# Patient Record
Sex: Male | Born: 1955 | Race: White | Hispanic: No | Marital: Married | Smoking: Former smoker
Health system: Southern US, Community
[De-identification: ages and names within clinical notes are randomized; demographics above are authoritative.]

## PROBLEM LIST (undated history)

## (undated) DIAGNOSIS — C61 Malignant neoplasm of prostate: Secondary | ICD-10-CM

## (undated) DIAGNOSIS — T8859XA Other complications of anesthesia, initial encounter: Secondary | ICD-10-CM

## (undated) DIAGNOSIS — Z85828 Personal history of other malignant neoplasm of skin: Secondary | ICD-10-CM

## (undated) DIAGNOSIS — R0600 Dyspnea, unspecified: Secondary | ICD-10-CM

## (undated) DIAGNOSIS — K219 Gastro-esophageal reflux disease without esophagitis: Secondary | ICD-10-CM

## (undated) DIAGNOSIS — E039 Hypothyroidism, unspecified: Secondary | ICD-10-CM

## (undated) DIAGNOSIS — Z803 Family history of malignant neoplasm of breast: Secondary | ICD-10-CM

## (undated) DIAGNOSIS — D649 Anemia, unspecified: Secondary | ICD-10-CM

## (undated) DIAGNOSIS — N320 Bladder-neck obstruction: Secondary | ICD-10-CM

## (undated) DIAGNOSIS — Z9889 Other specified postprocedural states: Secondary | ICD-10-CM

## (undated) DIAGNOSIS — Z8042 Family history of malignant neoplasm of prostate: Secondary | ICD-10-CM

## (undated) DIAGNOSIS — C801 Malignant (primary) neoplasm, unspecified: Secondary | ICD-10-CM

## (undated) DIAGNOSIS — Z800 Family history of malignant neoplasm of digestive organs: Secondary | ICD-10-CM

## (undated) DIAGNOSIS — R112 Nausea with vomiting, unspecified: Secondary | ICD-10-CM

## (undated) HISTORY — PX: WISDOM TOOTH EXTRACTION: SHX21

## (undated) HISTORY — DX: Family history of malignant neoplasm of prostate: Z80.42

## (undated) HISTORY — PX: SKIN CANCER EXCISION: SHX779

## (undated) HISTORY — DX: Family history of malignant neoplasm of breast: Z80.3

## (undated) HISTORY — PX: TOOTH EXTRACTION: SHX859

## (undated) HISTORY — PX: TONSILLECTOMY: SHX5217

## (undated) HISTORY — PX: PROSTATE SURGERY: SHX751

## (undated) HISTORY — DX: Family history of malignant neoplasm of digestive organs: Z80.0

---
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
## 1993-03-19 HISTORY — PX: HERNIA REPAIR: SHX51

## 2013-03-19 DIAGNOSIS — K5792 Diverticulitis of intestine, part unspecified, without perforation or abscess without bleeding: Secondary | ICD-10-CM

## 2013-03-19 HISTORY — DX: Diverticulitis of intestine, part unspecified, without perforation or abscess without bleeding: K57.92

## 2016-12-18 ENCOUNTER — Emergency Department (HOSPITAL_COMMUNITY)
Admission: EM | Admit: 2016-12-18 | Discharge: 2016-12-18 | Disposition: A | Discharge status: Home / Self Care | Payer: BLUE CROSS/BLUE SHIELD | Attending: Emergency Medicine | Admitting: Emergency Medicine

## 2016-12-18 ENCOUNTER — Emergency Department (HOSPITAL_COMMUNITY): Payer: BLUE CROSS/BLUE SHIELD

## 2016-12-18 ENCOUNTER — Other Ambulatory Visit: Payer: Self-pay | Admitting: Urology

## 2016-12-18 ENCOUNTER — Encounter (HOSPITAL_COMMUNITY): Payer: Self-pay

## 2016-12-18 DIAGNOSIS — R339 Retention of urine, unspecified: Secondary | ICD-10-CM | POA: Diagnosis present

## 2016-12-18 DIAGNOSIS — R319 Hematuria, unspecified: Secondary | ICD-10-CM | POA: Diagnosis not present

## 2016-12-18 DIAGNOSIS — C61 Malignant neoplasm of prostate: Secondary | ICD-10-CM

## 2016-12-18 DIAGNOSIS — Z87891 Personal history of nicotine dependence: Secondary | ICD-10-CM | POA: Insufficient documentation

## 2016-12-18 LAB — URINALYSIS, ROUTINE W REFLEX MICROSCOPIC
BACTERIA UA: NONE SEEN
Bilirubin Urine: NEGATIVE
Glucose, UA: NEGATIVE mg/dL
Ketones, ur: 20 mg/dL — AB
LEUKOCYTES UA: NEGATIVE
NITRITE: NEGATIVE
Protein, ur: 100 mg/dL — AB
Specific Gravity, Urine: 1.019 (ref 1.005–1.030)
pH: 6 (ref 5.0–8.0)

## 2016-12-18 LAB — CBC WITH DIFFERENTIAL/PLATELET
BASOS ABS: 0 10*3/uL (ref 0.0–0.1)
BASOS PCT: 0 %
EOS ABS: 0 10*3/uL (ref 0.0–0.7)
EOS PCT: 0 %
HCT: 42.1 % (ref 39.0–52.0)
Hemoglobin: 15.2 g/dL (ref 13.0–17.0)
Lymphocytes Relative: 18 %
Lymphs Abs: 1.3 10*3/uL (ref 0.7–4.0)
MCH: 31.5 pg (ref 26.0–34.0)
MCHC: 36.1 g/dL — ABNORMAL HIGH (ref 30.0–36.0)
MCV: 87.3 fL (ref 78.0–100.0)
Monocytes Absolute: 0.6 10*3/uL (ref 0.1–1.0)
Monocytes Relative: 8 %
Neutro Abs: 5.4 10*3/uL (ref 1.7–7.7)
Neutrophils Relative %: 74 %
PLATELETS: 293 10*3/uL (ref 150–400)
RBC: 4.82 MIL/uL (ref 4.22–5.81)
RDW: 12.3 % (ref 11.5–15.5)
WBC: 7.2 10*3/uL (ref 4.0–10.5)

## 2016-12-18 LAB — BASIC METABOLIC PANEL
ANION GAP: 7 (ref 5–15)
BUN: 11 mg/dL (ref 6–20)
CO2: 25 mmol/L (ref 22–32)
Calcium: 9.2 mg/dL (ref 8.9–10.3)
Chloride: 99 mmol/L — ABNORMAL LOW (ref 101–111)
Creatinine, Ser: 0.67 mg/dL (ref 0.61–1.24)
GFR calc Af Amer: >60 mL/min (ref >60)
Glucose, Bld: 104 mg/dL — ABNORMAL HIGH (ref 65–99)
POTASSIUM: 3.9 mmol/L (ref 3.5–5.1)
SODIUM: 131 mmol/L — AB (ref 135–145)

## 2016-12-18 MED ORDER — MORPHINE SULFATE (PF) 4 MG/ML IV SOLN: 4.0000 mg | Freq: Once | INTRAVENOUS | Status: DC

## 2016-12-18 MED ORDER — HYDROCODONE-ACETAMINOPHEN 5-325 MG PO TABS: 1.0000 | ORAL_TABLET | ORAL | 0 refills | Status: DC | PRN

## 2016-12-18 MED ORDER — ONDANSETRON HCL 4 MG/2ML IJ SOLN: 4.0000 mg | Freq: Once | INTRAMUSCULAR | Status: DC

## 2016-12-18 MED ORDER — ONDANSETRON HCL 4 MG PO TABS: 4.0000 mg | ORAL_TABLET | Freq: Four times a day (QID) | ORAL | 0 refills | Status: DC

## 2016-12-18 NOTE — ED Provider Notes (Signed)
[facility] DEPT Provider Note   CSN: [id number] Arrival date & time: 12/18/16  6270     History   Chief Complaint No chief complaint on file.   HPI Scott [name] is a 61 y.o. male.  Patient reports difficulty passing his urine and hematuria the past 2 weeks. Symptoms actually evolved over the past year and a half.  He was seen by his primary care physician Dr. Anastasio Champion and PSA was 159. He has not been able to urinate since midnight last night. No fever, sweats, chills, flank pain, weight loss. Severity of symptoms is moderate to severe. Nothing makes symptoms better or worse.       History reviewed. No pertinent past medical history.  There are no active problems to display for this patient.   Past Surgical History:  Procedure Laterality Date  . HERNIA REPAIR         Home Medications    Prior to Admission medications   Not on File    Family History No family history on file.  Social History Social History  Substance Use Topics  . Smoking status: Former Research scientist (life sciences)  . Smokeless tobacco: Never Used  . Alcohol use No     Allergies   Wellbutrin [bupropion]   Review of Systems Review of Systems  All other systems reviewed and are negative.    Physical Exam Updated Vital Signs BP (!) 186/102 (BP Location: Left Arm)   Pulse 94   Temp 98.5 F (36.9 C) (Oral)   Resp 18   Ht 5\' 6"  (1.676 m)   Wt 81.6 kg (180 lb)   SpO2 99%   BMI 29.05 kg/m   Physical Exam  Constitutional: He is oriented to person, place, and time. He appears well-developed and well-nourished.  HENT:  Head: Normocephalic and atraumatic.  Eyes: Conjunctivae are normal.  Neck: Neck supple.  Cardiovascular: Normal rate and regular rhythm.   Pulmonary/Chest: Effort normal and breath sounds normal.  Abdominal: Soft. Bowel sounds are normal.  Nontender abdomen.  Musculoskeletal: Normal range of motion.  Neurological: He is alert and oriented to person, place, and time.  Skin: Skin  is warm and dry.  Psychiatric: He has a normal mood and affect. His behavior is normal.  Nursing note and vitals reviewed.    ED Treatments / Results  Labs (all labs ordered are listed, but only abnormal results are displayed) Labs Reviewed  URINALYSIS, ROUTINE W REFLEX MICROSCOPIC - Abnormal; Notable for the following:       Result Value   Hgb urine dipstick LARGE (*)    Ketones, ur 20 (*)    Protein, ur 100 (*)    Squamous Epithelial / LPF 0-5 (*)    All other components within normal limits  CBC WITH DIFFERENTIAL/PLATELET - Abnormal; Notable for the following:    MCHC 36.1 (*)    All other components within normal limits  BASIC METABOLIC PANEL - Abnormal; Notable for the following:    Sodium 131 (*)    Chloride 99 (*)    Glucose, Bld 104 (*)    All other components within normal limits    EKG  EKG Interpretation None       Radiology US Renal  Result Date: 12/18/2016 CLINICAL DATA:  Urinary retention EXAM: RENAL / URINARY TRACT ULTRASOUND COMPLETE COMPARISON:  None. FINDINGS: Right Kidney: Length: 11.1 cm. Echogenicity within normal limits. No mass or hydronephrosis visualized. Left Kidney: Length: 10.8 cm. Echogenicity within normal limits. No mass or hydronephrosis visualized. Bladder: Decompressed  by Foley catheter. IMPRESSION: Normal appearing kidneys. Electronically Signed   By: Inez Catalina M.D.   On: 12/18/2016 11:19    Procedures Procedures (including critical care time)  Medications Ordered in ED Medications - No data to display   Initial Impression / Assessment and Plan / ED Course  I have reviewed the triage vital signs and the nursing notes.  Pertinent labs & imaging results that were available during my care of the patient were reviewed by me and considered in my medical decision making (see chart for details).     Patient presents with symptoms of urinary retention. Creatinine normal. Ultrasound of kidneys normal. Urinalysis shows RBCs, but no  WBCs.  I suspect BPH versus prostate cancer versus prostatitis. We'll see urologist this afternoon. Patient will be discharged home with a Foley catheter.  Final Clinical Impressions(s) / ED Diagnoses   Final diagnoses:  Urinary retention    New Prescriptions New Prescriptions   No medications on file     Nat Christen, MD 12/18/16 1225

## 2016-12-18 NOTE — ED Notes (Signed)
Pt changed to leg bag and instructions given on care.

## 2016-12-18 NOTE — ED Triage Notes (Signed)
Pt in restroom 

## 2016-12-18 NOTE — ED Triage Notes (Signed)
Pt reports difficulty urinating for the past 2 weeks.  Reports went to his pcp and was told his psa was very elevated and was concerning for prostate cancer.  Pt unable to void since midnight last night.  Pt says feels like has to void but can't.  Pt has appt with urologist this afternoon.

## 2016-12-18 NOTE — Discharge Instructions (Signed)
Follow-up with urologist this afternoon. Continue Foley catheter until instructed otherwise.

## 2016-12-31 ENCOUNTER — Emergency Department (HOSPITAL_COMMUNITY)
Admission: EM | Admit: 2016-12-31 | Discharge: 2016-12-31 | Disposition: A | Discharge status: Home / Self Care | Payer: BLUE CROSS/BLUE SHIELD | Attending: Emergency Medicine | Admitting: Emergency Medicine

## 2016-12-31 ENCOUNTER — Encounter (HOSPITAL_COMMUNITY): Payer: Self-pay

## 2016-12-31 DIAGNOSIS — Y829 Unspecified medical devices associated with adverse incidents: Secondary | ICD-10-CM | POA: Insufficient documentation

## 2016-12-31 DIAGNOSIS — T83098A Other mechanical complication of other indwelling urethral catheter, initial encounter: Secondary | ICD-10-CM | POA: Diagnosis not present

## 2016-12-31 DIAGNOSIS — Z87891 Personal history of nicotine dependence: Secondary | ICD-10-CM | POA: Diagnosis not present

## 2016-12-31 DIAGNOSIS — R339 Retention of urine, unspecified: Secondary | ICD-10-CM | POA: Diagnosis present

## 2016-12-31 DIAGNOSIS — T839XXA Unspecified complication of genitourinary prosthetic device, implant and graft, initial encounter: Secondary | ICD-10-CM

## 2016-12-31 LAB — URINALYSIS, ROUTINE W REFLEX MICROSCOPIC
Bilirubin Urine: NEGATIVE
Glucose, UA: NEGATIVE mg/dL
Ketones, ur: 20 mg/dL — AB
Nitrite: NEGATIVE
Protein, ur: 30 mg/dL — AB
SPECIFIC GRAVITY, URINE: 1.005 (ref 1.005–1.030)
SQUAMOUS EPITHELIAL / LPF: NONE SEEN
pH: 5 (ref 5.0–8.0)

## 2016-12-31 NOTE — ED Notes (Signed)
Pt in serve pain from indwelling foley catheter.  Irrigated foley with 50 ml of sterile water.  Immediately 600 ml of cloudy urine with particles flowed into foley bag.  Pt with immediately relief following irrigation.

## 2016-12-31 NOTE — ED Provider Notes (Signed)
[facility] EMERGENCY DEPARTMENT Provider Note   CSN: [id number] Arrival date & time: 12/31/16  1513     History   Chief Complaint Chief Complaint  Patient presents with  . Foley Catheter Clogged    HPI Scott [name] is a 61 y.o. male presenting with urinary retention.  He was recently diagnosed with prostate cancer, biopsy performed 4 days ago followed by foley placement.  He has had no concerns or pain until the tube stopped draining this morning, presenting with suprapubic pain and distention. He has found no alleviators and did not have the equipment at home to try to flush the tube. Denies fevers, chills, n/v or other complaint.  The history is provided by the patient and the spouse.    History reviewed. No pertinent past medical history.  There are no active problems to display for this patient.   Past Surgical History:  Procedure Laterality Date  . HERNIA REPAIR         Home Medications    Prior to Admission medications   Medication Sig Start Date End Date Taking? Authorizing Provider  HYDROcodone-acetaminophen (NORCO/VICODIN) 5-325 MG tablet Take 1-2 tablets by mouth every 4 (four) hours as needed. 12/18/16   Nat Christen, MD  ondansetron (ZOFRAN) 4 MG tablet Take 1 tablet (4 mg total) by mouth every 6 (six) hours. 12/18/16   Nat Christen, MD    Family History No family history on file.  Social History Social History  Substance Use Topics  . Smoking status: Former Research scientist (life sciences)  . Smokeless tobacco: Never Used  . Alcohol use No     Allergies   Wellbutrin [bupropion]   Review of Systems Review of Systems  Constitutional: Negative for fever.  HENT: Negative for congestion and sore throat.   Eyes: Negative.   Respiratory: Negative for chest tightness and shortness of breath.   Cardiovascular: Negative for chest pain.  Gastrointestinal: Positive for abdominal pain. Negative for nausea.  Genitourinary: Positive for difficulty urinating. Negative for flank  pain, hematuria, penile pain and urgency.  Musculoskeletal: Negative for arthralgias, joint swelling and neck pain.  Skin: Negative.  Negative for rash and wound.  Neurological: Negative for dizziness, weakness, light-headedness, numbness and headaches.  Psychiatric/Behavioral: Negative.      Physical Exam Updated Vital Signs There were no vitals taken for this visit.  Physical Exam  Constitutional: He appears well-developed and well-nourished.  HENT:  Head: Normocephalic and atraumatic.  Eyes: Conjunctivae are normal.  Neck: Normal range of motion.  Cardiovascular: Normal rate, regular rhythm, normal heart sounds and intact distal pulses.   Pulmonary/Chest: Effort normal and breath sounds normal. He has no wheezes.  Abdominal: Soft. Bowel sounds are normal. There is tenderness in the suprapubic area.  Suprapubic ttp , resolved with flushing of urine catheter.  Musculoskeletal: Normal range of motion.  Neurological: He is alert.  Skin: Skin is warm and dry.  Psychiatric: He has a normal mood and affect.  Nursing note and vitals reviewed.    ED Treatments / Results  Labs (all labs ordered are listed, but only abnormal results are displayed) Labs Reviewed  URINALYSIS, ROUTINE W REFLEX MICROSCOPIC - Abnormal; Notable for the following:       Result Value   APPearance HAZY (*)    Hgb urine dipstick LARGE (*)    Ketones, ur 20 (*)    Protein, ur 30 (*)    Leukocytes, UA SMALL (*)    Bacteria, UA RARE (*)    All other components within  normal limits  URINE CULTURE    EKG  EKG Interpretation None       Radiology No results found.  Procedures Procedures (including critical care time)  Medications Ordered in ED Medications - No data to display   Initial Impression / Assessment and Plan / ED Course  I have reviewed the triage vital signs and the nursing notes.  Pertinent labs & imaging results that were available during my care of the patient were reviewed by  me and considered in my medical decision making (see chart for details).     600 cc urine obtained after flushing. Sx relief obtained.  Urine negative for infection, some leukocytes, suspect reactive, culture ordered.  Pt sx free at time of dc.  Cath care instruction given. He is scheduled for further testing tomorrow with urology. Prn f/u anticipated.  Final Clinical Impressions(s) / ED Diagnoses   Final diagnoses:  Foley catheter problem, initial encounter [facility])    New Prescriptions New Prescriptions   No medications on file     Landis Martins 01/01/17 0139    Julianne Rice, MD 01/01/17 [phone]

## 2016-12-31 NOTE — ED Triage Notes (Signed)
Patient reports of catheter clogging today. Last urine seen was 1100 today. Patient had biopsy of prostate Thursday.

## 2017-01-01 ENCOUNTER — Encounter (HOSPITAL_COMMUNITY)
Admission: RE | Admit: 2017-01-01 | Discharge: 2017-01-01 | Disposition: A | Discharge status: Home / Self Care | Payer: BLUE CROSS/BLUE SHIELD | Source: Ambulatory Visit | Attending: Urology | Admitting: Urology

## 2017-01-01 DIAGNOSIS — C61 Malignant neoplasm of prostate: Secondary | ICD-10-CM | POA: Diagnosis not present

## 2017-01-01 MED ORDER — TECHNETIUM TC 99M MEDRONATE IV KIT
25.0000 | PACK | Freq: Once | INTRAVENOUS | Status: AC | PRN
  Administered 2017-01-01: 21.7 via INTRAVENOUS

## 2017-01-02 LAB — URINE CULTURE: Culture: NO GROWTH

## 2017-01-15 ENCOUNTER — Other Ambulatory Visit: Payer: Self-pay | Admitting: Urology

## 2017-01-21 NOTE — Progress Notes (Signed)
Urine culture, urinalysis 12-31-16 epic

## 2017-01-21 NOTE — Patient Instructions (Signed)
Scott [name]  01/21/2017   Your procedure is scheduled on: 01-25-17  Report to [facility] Main  Entrance Take [facility]  elevators to 3rd floor to  [place] at 1:30PM.    Call this number if you have problems the morning of surgery [phone]   Remember: ONLY 1 PERSON MAY GO WITH YOU TO SHORT STAY TO GET  READY MORNING OF [place].    Do not eat food After Midnight. You may have clear liquids from midnight until 930AM day of surgery. Nothing by mouth after 930AM!!     Take these medicines the morning of surgery with A SIP OF WATER: tylenol if needed                                You may not have any metal on your body including hair pins and              piercings  Do not wear jewelry, make-up, lotions, powders or perfumes, deodorant                         Men may shave face and neck.   Do not bring valuables to the hospital. [place].  Contacts, dentures or bridgework may not be worn into surgery.  Leave suitcase in the car. After surgery it may be brought to your room.                 Please read over the following fact sheets you were given: _____________________________________________________________________     CLEAR LIQUID DIET   Foods Allowed                                                                     Foods Excluded  Coffee and tea, regular and decaf                             liquids that you cannot  Plain Jell-O in any flavor                                             see through such as: Fruit ices (not with fruit pulp)                                     milk, soups, orange juice  Iced Popsicles                                    All solid food Carbonated beverages, regular and diet  Cranberry, grape and apple juices Sports drinks like Gatorade Lightly seasoned clear broth or consume(fat free) Sugar, honey syrup  Sample  Menu Breakfast                                Lunch                                     Supper Cranberry juice                    Beef broth                            Chicken broth Jell-O                                     Grape juice                           Apple juice Coffee or tea                        Jell-O                                      Popsicle                                                Coffee or tea                        Coffee or tea  _____________________________________________________________________  [facility] - Preparing for Surgery Before surgery, you can play an important role.  Because skin is not sterile, your skin needs to be as free of germs as possible.  You can reduce the number of germs on your skin by washing with CHG (chlorahexidine gluconate) soap before surgery.  CHG is an antiseptic cleaner which kills germs and bonds with the skin to continue killing germs even after washing. Please DO NOT use if you have an allergy to CHG or antibacterial soaps.  If your skin becomes reddened/irritated stop using the CHG and inform your nurse when you arrive at Short Stay. Do not shave (including legs and underarms) for at least 48 hours prior to the first CHG shower.  You may shave your face/neck. Please follow these instructions carefully:  1.  Shower with CHG Soap the night before surgery and the  morning of Surgery.  2.  If you choose to wash your hair, wash your hair first as usual with your  normal  shampoo.  3.  After you shampoo, rinse your hair and body thoroughly to remove the  shampoo.                           4.  Use CHG as you would any other liquid soap.  You can apply chg directly  to the skin and wash  Gently with a scrungie or clean washcloth.  5.  Apply the CHG Soap to your body ONLY FROM THE NECK DOWN.   Do not use on face/ open                           Wound or open sores. Avoid contact with eyes, ears mouth and genitals  (private parts).                       Wash face,  Genitals (private parts) with your normal soap.             6.  Wash thoroughly, paying special attention to the area where your surgery  will be performed.  7.  Thoroughly rinse your body with warm water from the neck down.  8.  DO NOT shower/wash with your normal soap after using and rinsing off  the CHG Soap.                9.  Pat yourself dry with a clean towel.            10.  Wear clean pajamas.            11.  Place clean sheets on your bed the night of your first shower and do not  sleep with pets. Day of Surgery : Do not apply any lotions/deodorants the morning of surgery.  Please wear clean clothes to the hospital/surgery center.  FAILURE TO FOLLOW THESE INSTRUCTIONS MAY RESULT IN THE CANCELLATION OF YOUR SURGERY PATIENT SIGNATURE_________________________________  NURSE SIGNATURE__________________________________  ________________________________________________________________________

## 2017-01-21 NOTE — Progress Notes (Signed)
Please place orders in epic. Patient has PAT appt 01-23-17. Thank you!

## 2017-01-23 ENCOUNTER — Other Ambulatory Visit: Payer: Self-pay

## 2017-01-23 ENCOUNTER — Encounter (HOSPITAL_COMMUNITY): Payer: Self-pay

## 2017-01-23 ENCOUNTER — Encounter (HOSPITAL_COMMUNITY)
Admission: RE | Admit: 2017-01-23 | Discharge: 2017-01-23 | Disposition: A | Discharge status: Home / Self Care | Payer: BLUE CROSS/BLUE SHIELD | Source: Ambulatory Visit | Attending: Urology | Admitting: Urology

## 2017-01-23 DIAGNOSIS — R339 Retention of urine, unspecified: Secondary | ICD-10-CM | POA: Diagnosis present

## 2017-01-23 DIAGNOSIS — C61 Malignant neoplasm of prostate: Secondary | ICD-10-CM | POA: Diagnosis not present

## 2017-01-23 DIAGNOSIS — Z87891 Personal history of nicotine dependence: Secondary | ICD-10-CM | POA: Diagnosis not present

## 2017-01-23 DIAGNOSIS — Z79899 Other long term (current) drug therapy: Secondary | ICD-10-CM | POA: Diagnosis not present

## 2017-01-23 LAB — CBC
HCT: 41.2 % (ref 39.0–52.0)
Hemoglobin: 14.9 g/dL (ref 13.0–17.0)
MCH: 31.8 pg (ref 26.0–34.0)
MCHC: 36.2 g/dL — ABNORMAL HIGH (ref 30.0–36.0)
MCV: 87.8 fL (ref 78.0–100.0)
PLATELETS: 290 10*3/uL (ref 150–400)
RBC: 4.69 MIL/uL (ref 4.22–5.81)
RDW: 12 % (ref 11.5–15.5)
WBC: 4.6 10*3/uL (ref 4.0–10.5)

## 2017-01-23 LAB — BASIC METABOLIC PANEL
Anion gap: 8 (ref 5–15)
BUN: 9 mg/dL (ref 6–20)
CALCIUM: 9.4 mg/dL (ref 8.9–10.3)
CO2: 26 mmol/L (ref 22–32)
CREATININE: 0.76 mg/dL (ref 0.61–1.24)
Chloride: 100 mmol/L — ABNORMAL LOW (ref 101–111)
GFR calc Af Amer: >60 mL/min (ref >60)
Glucose, Bld: 106 mg/dL — ABNORMAL HIGH (ref 65–99)
Potassium: 4.3 mmol/L (ref 3.5–5.1)
SODIUM: 134 mmol/L — AB (ref 135–145)

## 2017-01-23 NOTE — Progress Notes (Signed)
Consent not signed at pre-op as patient expresses wish to make changes. Patient reports he will contact surgeon office to make aware.

## 2017-01-24 ENCOUNTER — Other Ambulatory Visit: Payer: Self-pay | Admitting: Urology

## 2017-01-24 MED ORDER — GENTAMICIN SULFATE 40 MG/ML IJ SOLN
5.0000 mg/kg | INTRAMUSCULAR | Status: AC
Start: 2017-01-25 — End: 2017-01-25
  Administered 2017-01-25: 350 mg via INTRAVENOUS
  Filled 2017-01-24 (×2): qty 8.75

## 2017-01-24 MED ORDER — GENTAMICIN SULFATE 40 MG/ML IJ SOLN
5.0000 mg/kg | INTRAVENOUS | Status: DC
  Filled 2017-01-24: qty 10

## 2017-01-25 ENCOUNTER — Other Ambulatory Visit: Payer: Self-pay

## 2017-01-25 ENCOUNTER — Ambulatory Visit (HOSPITAL_COMMUNITY): Payer: BLUE CROSS/BLUE SHIELD | Admitting: Certified Registered Nurse Anesthetist

## 2017-01-25 ENCOUNTER — Encounter (HOSPITAL_COMMUNITY): Payer: Self-pay | Admitting: *Deleted

## 2017-01-25 ENCOUNTER — Encounter (HOSPITAL_COMMUNITY)
Admission: RE | Disposition: A | Discharge status: Home / Self Care | Payer: Self-pay | Source: Ambulatory Visit | Attending: Urology

## 2017-01-25 ENCOUNTER — Observation Stay (HOSPITAL_COMMUNITY)
Admission: RE | Admit: 2017-01-25 | Discharge: 2017-01-26 | Disposition: A | Discharge status: Home / Self Care | Payer: BLUE CROSS/BLUE SHIELD | Source: Ambulatory Visit | Attending: Urology | Admitting: Urology

## 2017-01-25 DIAGNOSIS — Z87891 Personal history of nicotine dependence: Secondary | ICD-10-CM | POA: Insufficient documentation

## 2017-01-25 DIAGNOSIS — Z79899 Other long term (current) drug therapy: Secondary | ICD-10-CM | POA: Insufficient documentation

## 2017-01-25 DIAGNOSIS — R339 Retention of urine, unspecified: Secondary | ICD-10-CM | POA: Diagnosis not present

## 2017-01-25 DIAGNOSIS — C61 Malignant neoplasm of prostate: Secondary | ICD-10-CM | POA: Insufficient documentation

## 2017-01-25 HISTORY — PX: TRANSURETHRAL RESECTION OF PROSTATE: SHX73

## 2017-01-25 SURGERY — TURP (TRANSURETHRAL RESECTION OF PROSTATE)
Anesthesia: General

## 2017-01-25 MED ORDER — TRAMADOL HCL 50 MG PO TABS: 50.0000 mg | ORAL_TABLET | Freq: Four times a day (QID) | ORAL | 0 refills | Status: DC | PRN

## 2017-01-25 MED ORDER — BICALUTAMIDE 50 MG PO TABS: 50.0000 mg | ORAL_TABLET | Freq: Every day | ORAL | 0 refills | Status: DC

## 2017-01-25 MED ORDER — LACTATED RINGERS IV SOLN
INTRAVENOUS | Status: DC
  Administered 2017-01-25 (×2): via INTRAVENOUS

## 2017-01-25 MED ORDER — PROPOFOL 10 MG/ML IV BOLUS
INTRAVENOUS | Status: AC
  Filled 2017-01-25: qty 20

## 2017-01-25 MED ORDER — DEXAMETHASONE SODIUM PHOSPHATE 10 MG/ML IJ SOLN
INTRAMUSCULAR | Status: DC | PRN
  Administered 2017-01-25: 10 mg via INTRAVENOUS

## 2017-01-25 MED ORDER — SENNOSIDES-DOCUSATE SODIUM 8.6-50 MG PO TABS
2.0000 | ORAL_TABLET | Freq: Every day | ORAL | Status: DC
  Filled 2017-01-25: qty 2

## 2017-01-25 MED ORDER — PROPOFOL 10 MG/ML IV BOLUS
INTRAVENOUS | Status: DC | PRN
Start: 2017-01-25 — End: 2017-01-25
  Administered 2017-01-25 (×2): 200 mg via INTRAVENOUS

## 2017-01-25 MED ORDER — FENTANYL CITRATE (PF) 100 MCG/2ML IJ SOLN
INTRAMUSCULAR | Status: DC | PRN
  Administered 2017-01-25 (×4): 50 ug via INTRAVENOUS

## 2017-01-25 MED ORDER — FENTANYL CITRATE (PF) 100 MCG/2ML IJ SOLN
INTRAMUSCULAR | Status: AC
  Filled 2017-01-25: qty 2

## 2017-01-25 MED ORDER — ACETAMINOPHEN 500 MG PO TABS
1000.0000 mg | ORAL_TABLET | Freq: Four times a day (QID) | ORAL | Status: DC
  Administered 2017-01-25 – 2017-01-26 (×3): 1000 mg via ORAL
  Filled 2017-01-25 (×3): qty 2

## 2017-01-25 MED ORDER — SODIUM CHLORIDE 0.9 % IV SOLN
INTRAVENOUS | Status: DC
  Administered 2017-01-25: 18:00:00 via INTRAVENOUS

## 2017-01-25 MED ORDER — FENTANYL CITRATE (PF) 100 MCG/2ML IJ SOLN
25.0000 ug | INTRAMUSCULAR | Status: DC | PRN
  Administered 2017-01-25: 25 ug via INTRAVENOUS
  Administered 2017-01-25: 50 ug via INTRAVENOUS
  Administered 2017-01-25: 25 ug via INTRAVENOUS
  Administered 2017-01-25: 50 ug via INTRAVENOUS

## 2017-01-25 MED ORDER — TRAMADOL HCL 50 MG PO TABS
50.0000 mg | ORAL_TABLET | Freq: Four times a day (QID) | ORAL | Status: DC | PRN
  Administered 2017-01-25: 100 mg via ORAL
  Administered 2017-01-26: 50 mg via ORAL
  Filled 2017-01-25: qty 2
  Filled 2017-01-25: qty 1

## 2017-01-25 MED ORDER — MIDAZOLAM HCL 2 MG/2ML IJ SOLN
INTRAMUSCULAR | Status: AC
  Filled 2017-01-25: qty 2

## 2017-01-25 MED ORDER — ONDANSETRON HCL 4 MG/2ML IJ SOLN
INTRAMUSCULAR | Status: AC
  Filled 2017-01-25: qty 2

## 2017-01-25 MED ORDER — CLINDAMYCIN PHOSPHATE 900 MG/50ML IV SOLN
900.0000 mg | INTRAVENOUS | Status: AC
  Administered 2017-01-25: 900 mg via INTRAVENOUS
  Filled 2017-01-25: qty 50

## 2017-01-25 MED ORDER — OXYCODONE HCL 5 MG/5ML PO SOLN: 5.0000 mg | Freq: Once | ORAL | Status: DC | PRN

## 2017-01-25 MED ORDER — HYDROMORPHONE HCL 1 MG/ML IJ SOLN
0.5000 mg | INTRAMUSCULAR | Status: DC | PRN
  Administered 2017-01-25: 1 mg via INTRAVENOUS
  Filled 2017-01-25: qty 1

## 2017-01-25 MED ORDER — DEXAMETHASONE SODIUM PHOSPHATE 10 MG/ML IJ SOLN
INTRAMUSCULAR | Status: AC
  Filled 2017-01-25: qty 1

## 2017-01-25 MED ORDER — LIDOCAINE 2% (20 MG/ML) 5 ML SYRINGE
INTRAMUSCULAR | Status: DC | PRN
  Administered 2017-01-25: 80 mg via INTRAVENOUS

## 2017-01-25 MED ORDER — MIDAZOLAM HCL 5 MG/5ML IJ SOLN
INTRAMUSCULAR | Status: DC | PRN
  Administered 2017-01-25 (×2): 1 mg via INTRAVENOUS

## 2017-01-25 MED ORDER — PROMETHAZINE HCL 25 MG/ML IJ SOLN: 6.2500 mg | INTRAMUSCULAR | Status: DC | PRN

## 2017-01-25 MED ORDER — OXYCODONE HCL 5 MG PO TABS: 5.0000 mg | ORAL_TABLET | Freq: Once | ORAL | Status: DC | PRN

## 2017-01-25 MED ORDER — MEPERIDINE HCL 50 MG/ML IJ SOLN: 6.2500 mg | INTRAMUSCULAR | Status: DC | PRN

## 2017-01-25 MED ORDER — ONDANSETRON HCL 4 MG/2ML IJ SOLN
INTRAMUSCULAR | Status: DC | PRN
  Administered 2017-01-25: 4 mg via INTRAVENOUS

## 2017-01-25 MED ORDER — SODIUM CHLORIDE 0.9 % IR SOLN
Status: DC | PRN
  Administered 2017-01-25: 12000 mL via INTRAVESICAL

## 2017-01-25 MED ORDER — CEPHALEXIN 500 MG PO CAPS: 500.0000 mg | ORAL_CAPSULE | Freq: Two times a day (BID) | ORAL | 0 refills | Status: DC

## 2017-01-25 SURGICAL SUPPLY — 16 items
BAG URINE DRAINAGE (UROLOGICAL SUPPLIES) ×3 IMPLANT
BAG URO CATCHER STRL LF (MISCELLANEOUS) ×3 IMPLANT
CATH HEMA 3WAY 30CC 22FR COUDE (CATHETERS) ×3 IMPLANT
COVER FOOTSWITCH UNIV (MISCELLANEOUS) ×3 IMPLANT
COVER SURGICAL LIGHT HANDLE (MISCELLANEOUS) IMPLANT
ELECT REM PT RETURN 15FT ADLT (MISCELLANEOUS) IMPLANT
GLOVE BIOGEL M STRL SZ7.5 (GLOVE) ×9 IMPLANT
GOWN STRL REUS W/TWL LRG LVL3 (GOWN DISPOSABLE) ×3 IMPLANT
GOWN STRL REUS W/TWL XL LVL3 (GOWN DISPOSABLE) ×3 IMPLANT
HOLDER FOLEY CATH W/STRAP (MISCELLANEOUS) ×3 IMPLANT
LOOP CUT BIPOLAR 24F LRG (ELECTROSURGICAL) ×3 IMPLANT
MANIFOLD NEPTUNE II (INSTRUMENTS) ×3 IMPLANT
PACK CYSTO (CUSTOM PROCEDURE TRAY) ×3 IMPLANT
SYRINGE IRR TOOMEY STRL 70CC (SYRINGE) ×3 IMPLANT
TUBING CONNECTING 10 (TUBING) ×2 IMPLANT
TUBING CONNECTING 10' (TUBING) ×1

## 2017-01-25 NOTE — Discharge Instructions (Signed)
1 - You may have urinary urgency (bladder spasms) and bloody urine on / off for few weeks. This is normal.  2 - Call MD or go to ER for fever >102, severe pain / nausea / vomiting not relieved by medications, or acute change in medical status

## 2017-01-25 NOTE — Brief Op Note (Signed)
01/25/2017  3:58 PM  PATIENT:  Scott [name]  61 y.o. male  PRE-OPERATIVE DIAGNOSIS:  PROSTATE CANCER,URINARY RETENTION  POST-OPERATIVE DIAGNOSIS:  urinary retention  PROCEDURE:  Procedure(s): TRANSURETHRAL RESECTION OF THE PROSTATE (TURP) (N/A)  SURGEON:  Surgeon(s) and Role:    * Alexis Frock, MD - Primary  PHYSICIAN ASSISTANT:   ASSISTANTS: none   ANESTHESIA:   general  EBL:  151mL  BLOOD ADMINISTERED:none  DRAINS: 42F 3 way foley to NS irrigation   LOCAL MEDICATIONS USED:  NONE  SPECIMEN:  Source of Specimen:  prostate / bladder neck chips  DISPOSITION OF SPECIMEN:  PATHOLOGY  COUNTS:  YES  TOURNIQUET:  * No tourniquets in log *  DICTATION: .Other Dictation: Dictation Number [id number]  PLAN OF CARE: Admit for overnight observation  PATIENT DISPOSITION:  PACU - hemodynamically stable.   Delay start of Pharmacological VTE agent (>24hrs) due to surgical blood loss or risk of bleeding: yes

## 2017-01-25 NOTE — Anesthesia Procedure Notes (Signed)
Procedure Name: LMA Insertion Date/Time: 01/25/2017 3:16 PM Performed by: West Pugh, CRNA Pre-anesthesia Checklist: Patient identified, Emergency Drugs available, Suction available, Patient being monitored and Timeout performed Patient Re-evaluated:Patient Re-evaluated prior to induction Oxygen Delivery Method: Circle system utilized Preoxygenation: Pre-oxygenation with 100% oxygen Induction Type: IV induction Ventilation: Mask ventilation without difficulty LMA: LMA with gastric port inserted LMA Size: 4.0 Number of attempts: 2 Placement Confirmation: positive ETCO2 and breath sounds checked- equal and bilateral Tube secured with: Tape Dental Injury: Teeth and Oropharynx as per pre-operative assessment

## 2017-01-25 NOTE — Anesthesia Postprocedure Evaluation (Signed)
Anesthesia Post Note  Patient: Scott [name]  Procedure(s) Performed: TRANSURETHRAL RESECTION OF THE PROSTATE (TURP) (N/A )     Patient location during evaluation: PACU Anesthesia Type: General Level of consciousness: sedated and patient cooperative Pain management: pain level controlled Vital Signs Assessment: post-procedure vital signs reviewed and stable Respiratory status: spontaneous breathing Cardiovascular status: stable Anesthetic complications: no    Last Vitals:  Vitals:   01/25/17 1730 01/25/17 2120  BP: (!) 145/77 119/76  Pulse: (!) 59 63  Resp: 12 16  Temp: 36.6 C 36.6 C  SpO2: 100% 94%    Last Pain:  Vitals:   01/25/17 2120  TempSrc: Oral  PainSc:                  Nolon Nations

## 2017-01-25 NOTE — Anesthesia Preprocedure Evaluation (Signed)
Anesthesia Evaluation  Patient identified by MRN, date of birth, ID band Patient awake    Reviewed: Allergy & Precautions, NPO status , Patient's Chart, lab work & pertinent test results  Airway Mallampati: II  TM Distance: >3 FB Neck ROM: Full    Dental no notable dental hx.    Pulmonary former smoker,    Pulmonary exam normal breath sounds clear to auscultation       Cardiovascular negative cardio ROS Normal cardiovascular exam Rhythm:Regular Rate:Normal     Neuro/Psych negative neurological ROS  negative psych ROS   GI/Hepatic negative GI ROS, Neg liver ROS,   Endo/Other  negative endocrine ROS  Renal/GU negative Renal ROS     Musculoskeletal negative musculoskeletal ROS (+)   Abdominal   Peds  Hematology negative hematology ROS (+)   Anesthesia Other Findings   Reproductive/Obstetrics                            Anesthesia Physical Anesthesia Plan  ASA: II  Anesthesia Plan: General   Post-op Pain Management:    Induction: Intravenous  PONV Risk Score and Plan: 3 and Ondansetron, Dexamethasone, Midazolam and Treatment may vary due to age or medical condition  Airway Management Planned: LMA  Additional Equipment:   Intra-op Plan:   Post-operative Plan: Extubation in OR  Informed Consent: I have reviewed the patients History and Physical, chart, labs and discussed the procedure including the risks, benefits and alternatives for the proposed anesthesia with the patient or authorized representative who has indicated his/her understanding and acceptance.   Dental advisory given  Plan Discussed with: CRNA  Anesthesia Plan Comments:         Anesthesia Quick Evaluation

## 2017-01-25 NOTE — Transfer of Care (Signed)
Immediate Anesthesia Transfer of Care Note  Patient: Scott [name]  Procedure(s) Performed: TRANSURETHRAL RESECTION OF THE PROSTATE (TURP) (N/A )  Patient Location: PACU  Anesthesia Type:General  Level of Consciousness: sedated, drowsy and patient cooperative  Airway & Oxygen Therapy: Patient Spontanous Breathing and Patient connected to face mask  Post-op Assessment: Report given to RN, Post -op Vital signs reviewed and stable and Patient moving all extremities X 4  Post vital signs: Reviewed and stable  Last Vitals:  Vitals:   01/25/17 1328 01/25/17 1615  BP: (!) 164/89 125/83  Pulse: 74 68  Resp: 18 17  Temp: 36.9 C   SpO2: 98% 99%    Last Pain:  Vitals:   01/25/17 1412  TempSrc:   PainSc: 2       Patients Stated Pain Goal: 2 (19/41/74 0814)  Complications: No apparent anesthesia complications

## 2017-01-25 NOTE — H&P (Signed)
Scott [name] is an 61 y.o. male.    Chief Complaint: Pre-op Transurethral Resection of Prostate  HPI:  1 - Gross Hematuria - new gross hematuria x several 2018. Former 52 [place] smoker. Very elevated PSA as per below. CT w/o upper tract lesions.   2 - Metastatic Prostate Cancer - PSA 159.8 by PCP labs on initial intake 12/2016, TRUS BX Gleason 4+5=9 adenocarcioma up up to 95% of 12/12 cores, 80 mL Vol 03/2016. CT with bilateral non-bulky iliac adenopathy and tiny uptake Lt orbit (no spine mets) by bone scan.   3 - Urinary Retention - new inability to void 12/2016. LIkely prostate CA as per above. CAtheter placed at [place]. Cr 0.67. TRUS 62mL.   PMH sig for TNA, Umbil hernia repair (has recurrence). His PCP is Hurshel Party MD in [place].   Today " WIll " is seen to proceed with TURP.    No past medical history on file.  Past Surgical History:  Procedure Laterality Date  . HERNIA REPAIR  9924   UMBILICAL   . WISDOM TOOTH EXTRACTION      No family history on file. Social History:  reports that he has quit smoking. His smoking use included cigarettes. he has never used smokeless tobacco. He reports that he does not drink alcohol or use drugs.  Allergies:  Allergies  Allergen Reactions  . Wellbutrin [Bupropion]     Hives     No medications prior to admission.    No results found for this or any previous visit (from the past 48 hour(s)). No results found.  Review of Systems  Constitutional: Negative.  Negative for chills and fever.  HENT: Negative.   Eyes: Negative.   Respiratory: Negative.   Cardiovascular: Negative.   Gastrointestinal: Negative.   Genitourinary: Negative.   Musculoskeletal: Negative.   Skin: Negative.   Neurological: Negative.   Endo/Heme/Allergies: Negative.   Psychiatric/Behavioral: Negative.     There were no vitals taken for this visit. Physical Exam  Constitutional: He appears well-developed.  HENT:  Head: Normocephalic.  Eyes:  Pupils are equal, round, and reactive to light.  Neck: Normal range of motion.  Cardiovascular: Normal rate.  Respiratory: Effort normal.  GI: Soft.  Genitourinary:  Genitourinary Comments: Foley catheter in place with yellow urine.   Musculoskeletal: Normal range of motion.  Skin: Skin is warm.  Psychiatric: He has a normal mood and affect.     Assessment/Plan  Proceed with TURP today. Risks, benefits, alternatives, expected peri-op course with home with catheter and office trial of void discussed previously and reiterated today. He opts for [facility] agonist therapy for androgen deprivation and declines orchiectomy. This is reasonable.   Alexis Frock, MD 01/25/2017, 11:00 AM

## 2017-01-26 ENCOUNTER — Other Ambulatory Visit: Payer: Self-pay

## 2017-01-26 DIAGNOSIS — R339 Retention of urine, unspecified: Secondary | ICD-10-CM | POA: Diagnosis not present

## 2017-01-26 LAB — BASIC METABOLIC PANEL
ANION GAP: 9 (ref 5–15)
BUN: 12 mg/dL (ref 6–20)
CALCIUM: 9.3 mg/dL (ref 8.9–10.3)
CHLORIDE: 99 mmol/L — AB (ref 101–111)
CO2: 24 mmol/L (ref 22–32)
CREATININE: 0.61 mg/dL (ref 0.61–1.24)
GFR calc non Af Amer: >60 mL/min (ref >60)
Glucose, Bld: 111 mg/dL — ABNORMAL HIGH (ref 65–99)
Potassium: 4.4 mmol/L (ref 3.5–5.1)
SODIUM: 132 mmol/L — AB (ref 135–145)

## 2017-01-26 LAB — HEMOGLOBIN AND HEMATOCRIT, BLOOD
HCT: 39.4 % (ref 39.0–52.0)
Hemoglobin: 14 g/dL (ref 13.0–17.0)

## 2017-01-26 NOTE — Plan of Care (Signed)
Cont plan of care

## 2017-01-26 NOTE — Plan of Care (Signed)
  Safety: Ability to remain free from injury will improve 01/26/2017 0411 - Progressing by Jamse Mead, RN   Pain Management: General experience of comfort will improve 01/26/2017 0411 - Progressing by Jamse Mead, RN

## 2017-01-26 NOTE — Progress Notes (Signed)
Pt discharged to home, Reviewed discharged instructions with wife and patient. acknowledge understanding. Pt instable condition. SRP, RN

## 2017-01-26 NOTE — Plan of Care (Signed)
  Clinical Measurements: Will remain free from infection 01/26/2017 0418 - Progressing by Jamse Mead, RN 01/26/2017 0411 - Progressing by Jamse Mead, RN   Pain Managment: General experience of comfort will improve 01/26/2017 0418 - Progressing by Jamse Mead, RN 01/26/2017 0411 - Progressing by Jamse Mead, RN

## 2017-01-28 NOTE — Op Note (Signed)
NAME:  Scott [name], Scott [name] NO.:  MEDICAL RECORD NO.:  [id number]  LOCATION:                                 FACILITY:  PHYSICIAN:  Alexis Frock, MD     DATE OF BIRTH:  1955-10-07  DATE OF PROCEDURE: 01/25/2017                              OPERATIVE REPORT   PREOPERATIVE DIAGNOSIS:  Metastatic prostate cancer with urinary retention.  POSTOPERATIVE DIAGNOSES:  Metastatic prostate cancer with urinary retention plus possible additional urothelial carcinoma and impending malignant ureteral obstruction.  PROCEDURE:  Transurethral resection of the prostate.  ESTIMATED BLOOD LOSS:  100 mL.  COMPLICATION:  None.  SPECIMEN:  Prostate and bladder neck tissue chips for permanent pathology.  FINDINGS: 1. Moderate bilobar and bladder neck hypertrophy with friability pre     resection. 2. Mass effect with likely prostate versus urothelial carcinoma     invading trigone area. 3. Wide open urinary channel from the bladder neck to the area of the     verumontanum post resection.  DRAINS:  A 22-French 3-way Foley catheter to normal saline irrigation, efflux light pink.  INDICATIONS:  Scott [name] is a pleasant, but quite unfortunate 61 year old gentleman, who was found on workup of extremely elevated PSA and abnormal prostate exam to have likely metastatic prostate cancer with large volume high-risk disease, bulky pelvic adenopathy on imaging.  He is also in urinary retention.  His goal is to become catheter free if possible.  Options were discussed for management including recommended palliative androgen deprivation with and without outlet procedure with TURP and he adamantly wished to proceed with TURP and [facility] agonist therapy.  Informed consent was obtained and placed in the medical record.  PROCEDURE IN DETAIL:  The patient being Scott [name], was verified. Procedure being transurethral resection of the prostate was confirmed. Procedure was carried out.   Time-out was performed.  Intravenous antibiotics were administered.  General anesthesia introduced.  The patient was placed into a low lithotomy position, sterile field was created by prepping and draping the patient's penis, perineum and proximal thighs using iodine.  Next, cystourethroscopy was performed using a 26-French resectoscope sheath with visual obturator.  Inspection of the anterior and posterior urethra revealed a very firm and fixed prostate that was somewhat high riding.  There was large mass effect with minimal open urinary channel from the area of the verumontanum towards the area of the bladder neck.  The bladder neck and trigone area were quite abnormal with what appeared to be obvious malignant tissue, some of this even have a papillary appearance clearing second primary bladder neck urothelial tumor versus aggressive prostate cancer alone. Using resectoscope loop, very careful resection was performed in a top- down orientation first at 12 o'clock position and the bladder neck to the area of the verumontanum, and then at the right lobe of the prostate, then at the left lobe of the prostate creating a wide open urinary channel between bladder neck and verumontanum.  The tissue was quite abnormal in its consistency consistent with likely advanced cancer notably at the area of the left bladder neck.  The tissue did additionally have somewhat papillary appearance and this  was somewhat worrisome for possible additional urothelial primary at the area of the bladder neck on the left side in prostate junction.  The trigone area was quite abnormal and the left ureteral orifice appeared to be directly involved with this cancer.  The left ureteral orifice was purposely resected using cut current only and was visibly patent following.  The right ureteral orifice was visualized with somewhat appeared to be just mucosal edema from catheter, but was fortunately uninvolved grossly  with the cancer.  Additional hemostasis was carefully applied to the resection bed using point coagulation current.  The prostate chips were irrigated and set aside for permanent pathology, labeled as prostate with history of prostate cancer.  The resectoscope was exchanged for a new 22-French 3-way Foley catheter.  After the entire bladder was once again inspected and there was no evidence of perforation, hemostasis appeared excellent.  A 20 mL sterile water was placed in the balloon, was connected to normal saline irrigation, efflux light pink.  Procedure was terminated.  The patient tolerated the procedure well.  There were no immediate periprocedural complications.  The patient was taken to the postanesthesia care unit in stable condition.          ______________________________ Alexis Frock, MD     TM/MEDQ  D:  01/25/2017  T:  01/25/2017  Job:  [id number]

## 2017-02-04 NOTE — Discharge Summary (Signed)
Physician Discharge Summary  Patient ID: Scott [name] MRN: [id number] DOB/AGE: 05/10/1955 61 y.o.  Admit date: 01/25/2017 Discharge date: 01/26/2017  Admission Diagnoses: Prostate cancer and urinary retention Discharge Diagnoses:  Active Problems:   Prostate cancer metastatic to multiple sites [facility])   Discharged Condition: good  Hospital Course: The patient tolerated the procedure well and was transferred to the floor on IV pain meds, IV fluid. On POD#1  pt was started on regular diet and they ambulated in the halls. Prior to discharge the pt was tolerating a regular diet, pain was controlled on PO pain meds, they were ambulating without difficulty, and they had normal bowel function.   Consults: None  Significant Diagnostic Studies: none  Treatments: surgery: TURP  Discharge Exam: Blood pressure 129/76, pulse (!) 58, temperature 97.6 F (36.4 C), temperature source Oral, resp. rate 16, height 5\' 6"  (1.676 m), weight 79.4 kg (175 lb), SpO2 100 %. General appearance: alert, cooperative and appears stated age Head: Normocephalic, without obvious abnormality, atraumatic Nose: Nares normal. Septum midline. Mucosa normal. No drainage or sinus tenderness. Resp: clear to auscultation bilaterally Cardio: regular rate and rhythm, S1, S2 normal, no murmur, click, rub or gallop GI: soft, non-tender; bowel sounds normal; no masses,  no organomegaly Extremities: extremities normal, atraumatic, no cyanosis or edema Neurologic: Grossly normal  Disposition: 01-Home or Self Care  Discharge Instructions    Discharge patient   Complete by:  As directed    Discharge disposition:  01-Home or Self Care   Discharge patient date:  01/26/2017     Allergies as of 01/26/2017      Reactions   Wellbutrin [bupropion]    Hives      Medication List    STOP taking these medications   HYDROcodone-acetaminophen 5-325 MG tablet Commonly known as:  NORCO/VICODIN     TAKE these medications    acetaminophen 500 MG tablet Commonly known as:  TYLENOL Take 1,000 mg by mouth every 8 (eight) hours as needed for mild pain.   ASTRAGALUS PO Take 500 mg by mouth daily.   bicalutamide 50 MG tablet Commonly known as:  CASODEX Take 1 tablet (50 mg total) daily by mouth. TO prevent androgen flair   cephALEXin 500 MG capsule Commonly known as:  KEFLEX Take 1 capsule (500 mg total) 2 (two) times daily by mouth. X 3 days. Begin day prior to next Urology appointment   D 5000 5000 units capsule Generic drug:  Cholecalciferol Take 5,000 Units by mouth daily.   ibuprofen 200 MG tablet Commonly known as:  ADVIL,MOTRIN Take 400 mg by mouth every 8 (eight) hours as needed for headache or mild pain.   ondansetron 4 MG tablet Commonly known as:  ZOFRAN Take 1 tablet (4 mg total) by mouth every 6 (six) hours.   OVER THE COUNTER MEDICATION Apply 1 application topically daily as needed (pain).   OVER THE COUNTER MEDICATION Take 2 capsules by mouth daily. Liver Blend : Milk thistle and Dandelion root 475 mg   OVER THE COUNTER MEDICATION Take 3,000 mg by mouth daily. [place] Tail Mushroom  Anti-Cancer /Immune Booster   OVER THE COUNTER MEDICATION Take 1 scoop by mouth daily. Mushroom Powder contains 200 mg ea of [place], Reishi, [place], [place] Argar[place], [place], [place] tail , Cordyceps and [place]   traMADol 50 MG tablet Commonly known as:  ULTRAM Take 1 tablet (50 mg total) every 6 (six) hours as needed by mouth for moderate pain or severe pain. Post-operatively   TURMERIC CURCUMIN  PO Take 750 mg by mouth daily.   VITAMIN C PLUS WILD ROSE HIPS PO Take 2 capsules by mouth daily.      Follow-up Information    Alexis Frock, MD On 01/29/2017.   Specialty:  Urology Why:  at 10:15 for MD visit and office catheter removal.  Contact information: [place] [address] [phone]           Signed: Nicolette Bang 02/04/2017, 11:31 AM

## 2017-04-23 ENCOUNTER — Other Ambulatory Visit (HOSPITAL_COMMUNITY)
Admission: RE | Admit: 2017-04-23 | Discharge: 2017-04-23 | Disposition: A | Discharge status: Home / Self Care | Payer: BLUE CROSS/BLUE SHIELD | Source: Ambulatory Visit | Attending: Urology | Admitting: Urology

## 2017-04-23 DIAGNOSIS — C61 Malignant neoplasm of prostate: Secondary | ICD-10-CM | POA: Diagnosis present

## 2017-04-23 LAB — COMPREHENSIVE METABOLIC PANEL
ALK PHOS: 67 U/L (ref 38–126)
ALT: 19 U/L (ref 17–63)
ANION GAP: 9 (ref 5–15)
AST: 21 U/L (ref 15–41)
Albumin: 3.9 g/dL (ref 3.5–5.0)
BUN: 14 mg/dL (ref 6–20)
CALCIUM: 9.5 mg/dL (ref 8.9–10.3)
CO2: 25 mmol/L (ref 22–32)
CREATININE: 0.71 mg/dL (ref 0.61–1.24)
Chloride: 102 mmol/L (ref 101–111)
Glucose, Bld: 132 mg/dL — ABNORMAL HIGH (ref 65–99)
Potassium: 4.1 mmol/L (ref 3.5–5.1)
SODIUM: 136 mmol/L (ref 135–145)
TOTAL PROTEIN: 6.5 g/dL (ref 6.5–8.1)
Total Bilirubin: 1 mg/dL (ref 0.3–1.2)

## 2017-04-23 LAB — PSA: Prostatic Specific Antigen: 2.86 ng/mL (ref 0.00–4.00)

## 2017-07-30 ENCOUNTER — Other Ambulatory Visit (HOSPITAL_COMMUNITY)
Admission: RE | Admit: 2017-07-30 | Discharge: 2017-07-30 | Disposition: A | Discharge status: Home / Self Care | Payer: BLUE CROSS/BLUE SHIELD | Source: Ambulatory Visit | Attending: Internal Medicine | Admitting: Internal Medicine

## 2017-07-30 DIAGNOSIS — C61 Malignant neoplasm of prostate: Secondary | ICD-10-CM | POA: Insufficient documentation

## 2017-07-30 LAB — PSA: PROSTATIC SPECIFIC ANTIGEN: 3.44 ng/mL (ref 0.00–4.00)

## 2017-07-31 LAB — TESTOSTERONE,FREE AND TOTAL
Testosterone, Free: 1 pg/mL — ABNORMAL LOW (ref 6.6–18.1)
Testosterone: 3 ng/dL — ABNORMAL LOW (ref 264–916)

## 2017-11-22 ENCOUNTER — Ambulatory Visit: Payer: BLUE CROSS/BLUE SHIELD | Admitting: Urology

## 2017-11-22 DIAGNOSIS — C775 Secondary and unspecified malignant neoplasm of intrapelvic lymph nodes: Secondary | ICD-10-CM | POA: Diagnosis not present

## 2017-11-22 DIAGNOSIS — C61 Malignant neoplasm of prostate: Secondary | ICD-10-CM

## 2017-11-28 ENCOUNTER — Telehealth: Payer: Self-pay | Admitting: Genetic Counselor

## 2017-11-28 ENCOUNTER — Encounter: Payer: Self-pay | Admitting: Genetic Counselor

## 2017-11-28 NOTE — Telephone Encounter (Signed)
Genetic counseling referral received from Dr. Jeffie Pollock for prostate cancer. Pt has been scheduled to see Scott [name] on 10/9 at 1pm. Pt aware to arrive 30 minutes early. Letter mailed.

## 2017-12-25 ENCOUNTER — Encounter: Payer: BLUE CROSS/BLUE SHIELD | Admitting: Genetic Counselor

## 2017-12-25 ENCOUNTER — Other Ambulatory Visit: Payer: BLUE CROSS/BLUE SHIELD

## 2018-01-31 ENCOUNTER — Other Ambulatory Visit (HOSPITAL_COMMUNITY)
Admission: RE | Admit: 2018-01-31 | Discharge: 2018-01-31 | Disposition: A | Discharge status: Home / Self Care | Payer: BLUE CROSS/BLUE SHIELD | Source: Ambulatory Visit | Attending: Urology | Admitting: Urology

## 2018-01-31 DIAGNOSIS — C61 Malignant neoplasm of prostate: Secondary | ICD-10-CM | POA: Diagnosis not present

## 2018-01-31 LAB — PSA: PROSTATIC SPECIFIC ANTIGEN: 2.19 ng/mL (ref 0.00–4.00)

## 2018-02-01 LAB — TESTOSTERONE: TESTOSTERONE: 4 ng/dL — AB (ref 264–916)

## 2018-02-07 ENCOUNTER — Ambulatory Visit (INDEPENDENT_AMBULATORY_CARE_PROVIDER_SITE_OTHER): Payer: BLUE CROSS/BLUE SHIELD | Admitting: Urology

## 2018-02-07 DIAGNOSIS — C61 Malignant neoplasm of prostate: Secondary | ICD-10-CM

## 2018-02-07 DIAGNOSIS — R351 Nocturia: Secondary | ICD-10-CM | POA: Diagnosis not present

## 2018-02-07 DIAGNOSIS — C775 Secondary and unspecified malignant neoplasm of intrapelvic lymph nodes: Secondary | ICD-10-CM | POA: Diagnosis not present

## 2018-03-10 ENCOUNTER — Other Ambulatory Visit (HOSPITAL_COMMUNITY)
Admission: RE | Admit: 2018-03-10 | Discharge: 2018-03-10 | Disposition: A | Discharge status: Home / Self Care | Payer: BLUE CROSS/BLUE SHIELD | Source: Ambulatory Visit | Attending: Urology | Admitting: Urology

## 2018-03-10 DIAGNOSIS — C61 Malignant neoplasm of prostate: Secondary | ICD-10-CM | POA: Insufficient documentation

## 2018-03-10 LAB — PSA: PROSTATIC SPECIFIC ANTIGEN: 1.87 ng/mL (ref 0.00–4.00)

## 2018-03-11 LAB — ESTRADIOL: ESTRADIOL: 24.3 pg/mL (ref 7.6–42.6)

## 2018-03-11 LAB — TESTOSTERONE: Testosterone: <3 ng/dL — ABNORMAL LOW (ref 264–916)

## 2018-05-09 ENCOUNTER — Other Ambulatory Visit (HOSPITAL_COMMUNITY)
Admission: RE | Admit: 2018-05-09 | Discharge: 2018-05-09 | Disposition: A | Discharge status: Home / Self Care | Payer: BLUE CROSS/BLUE SHIELD | Source: Ambulatory Visit | Attending: Urology | Admitting: Urology

## 2018-05-09 DIAGNOSIS — C61 Malignant neoplasm of prostate: Secondary | ICD-10-CM | POA: Insufficient documentation

## 2018-05-09 LAB — PSA: Prostatic Specific Antigen: 2.09 ng/mL (ref 0.00–4.00)

## 2018-05-10 LAB — ESTRADIOL: Estradiol: 32.9 pg/mL (ref 7.6–42.6)

## 2018-05-10 LAB — TESTOSTERONE: TESTOSTERONE: 10 ng/dL — AB (ref 264–916)

## 2018-05-16 ENCOUNTER — Ambulatory Visit (INDEPENDENT_AMBULATORY_CARE_PROVIDER_SITE_OTHER): Payer: BLUE CROSS/BLUE SHIELD | Admitting: Urology

## 2018-05-16 DIAGNOSIS — R351 Nocturia: Secondary | ICD-10-CM

## 2018-05-16 DIAGNOSIS — C61 Malignant neoplasm of prostate: Secondary | ICD-10-CM | POA: Diagnosis not present

## 2018-05-16 DIAGNOSIS — R9721 Rising PSA following treatment for malignant neoplasm of prostate: Secondary | ICD-10-CM | POA: Diagnosis not present

## 2018-05-16 DIAGNOSIS — C775 Secondary and unspecified malignant neoplasm of intrapelvic lymph nodes: Secondary | ICD-10-CM | POA: Diagnosis not present

## 2018-07-17 ENCOUNTER — Ambulatory Visit (INDEPENDENT_AMBULATORY_CARE_PROVIDER_SITE_OTHER): Payer: BLUE CROSS/BLUE SHIELD | Admitting: Internal Medicine

## 2018-07-18 DEATH — deceased

## 2018-08-08 ENCOUNTER — Ambulatory Visit (INDEPENDENT_AMBULATORY_CARE_PROVIDER_SITE_OTHER): Payer: BLUE CROSS/BLUE SHIELD | Admitting: Urology

## 2018-08-08 DIAGNOSIS — C61 Malignant neoplasm of prostate: Secondary | ICD-10-CM

## 2018-09-08 ENCOUNTER — Other Ambulatory Visit (HOSPITAL_COMMUNITY)
Admission: RE | Admit: 2018-09-08 | Discharge: 2018-09-08 | Disposition: A | Discharge status: Home / Self Care | Payer: BC Managed Care – PPO | Source: Ambulatory Visit | Attending: Urology | Admitting: Urology

## 2018-09-08 ENCOUNTER — Other Ambulatory Visit: Payer: Self-pay

## 2018-09-08 DIAGNOSIS — R69 Illness, unspecified: Secondary | ICD-10-CM | POA: Insufficient documentation

## 2018-09-08 LAB — PSA: Prostatic Specific Antigen: 4.37 ng/mL — ABNORMAL HIGH (ref 0.00–4.00)

## 2018-09-09 LAB — TESTOSTERONE: Testosterone: <3 ng/dL — ABNORMAL LOW (ref 264–916)

## 2018-11-06 ENCOUNTER — Other Ambulatory Visit (HOSPITAL_COMMUNITY)
Admission: RE | Admit: 2018-11-06 | Discharge: 2018-11-06 | Disposition: A | Discharge status: Home / Self Care | Payer: BC Managed Care – PPO | Source: Ambulatory Visit | Attending: Urology | Admitting: Urology

## 2018-11-06 DIAGNOSIS — C61 Malignant neoplasm of prostate: Secondary | ICD-10-CM | POA: Diagnosis present

## 2018-11-06 LAB — PSA: Prostatic Specific Antigen: 4.64 ng/mL — ABNORMAL HIGH (ref 0.00–4.00)

## 2018-11-07 LAB — TESTOSTERONE: Testosterone: <3 ng/dL — ABNORMAL LOW (ref 264–916)

## 2018-11-14 ENCOUNTER — Ambulatory Visit (INDEPENDENT_AMBULATORY_CARE_PROVIDER_SITE_OTHER): Payer: BC Managed Care – PPO | Admitting: Urology

## 2018-11-14 DIAGNOSIS — Z192 Hormone resistant malignancy status: Secondary | ICD-10-CM | POA: Diagnosis not present

## 2018-11-14 DIAGNOSIS — C775 Secondary and unspecified malignant neoplasm of intrapelvic lymph nodes: Secondary | ICD-10-CM

## 2018-11-14 DIAGNOSIS — R9721 Rising PSA following treatment for malignant neoplasm of prostate: Secondary | ICD-10-CM | POA: Diagnosis not present

## 2018-11-14 DIAGNOSIS — C61 Malignant neoplasm of prostate: Secondary | ICD-10-CM | POA: Diagnosis not present

## 2018-11-14 DIAGNOSIS — R351 Nocturia: Secondary | ICD-10-CM

## 2018-11-14 DIAGNOSIS — R3912 Poor urinary stream: Secondary | ICD-10-CM

## 2018-11-18 ENCOUNTER — Other Ambulatory Visit: Payer: Self-pay | Admitting: Urology

## 2018-11-18 DIAGNOSIS — C61 Malignant neoplasm of prostate: Secondary | ICD-10-CM

## 2018-11-18 DIAGNOSIS — R972 Elevated prostate specific antigen [PSA]: Secondary | ICD-10-CM

## 2018-11-26 ENCOUNTER — Encounter (HOSPITAL_COMMUNITY): Payer: Self-pay

## 2018-11-26 ENCOUNTER — Ambulatory Visit (HOSPITAL_COMMUNITY)
Admission: RE | Admit: 2018-11-26 | Discharge: 2018-11-26 | Disposition: A | Discharge status: Home / Self Care | Payer: BC Managed Care – PPO | Source: Ambulatory Visit | Attending: Urology | Admitting: Urology

## 2018-11-26 ENCOUNTER — Encounter (HOSPITAL_COMMUNITY)
Admission: RE | Admit: 2018-11-26 | Discharge: 2018-11-26 | Disposition: A | Discharge status: Home / Self Care | Payer: BC Managed Care – PPO | Source: Ambulatory Visit | Attending: Urology | Admitting: Urology

## 2018-11-26 ENCOUNTER — Other Ambulatory Visit: Payer: Self-pay

## 2018-11-26 DIAGNOSIS — C61 Malignant neoplasm of prostate: Secondary | ICD-10-CM

## 2018-11-26 DIAGNOSIS — R972 Elevated prostate specific antigen [PSA]: Secondary | ICD-10-CM | POA: Diagnosis not present

## 2018-11-26 MED ORDER — IOHEXOL 300 MG/ML  SOLN
100.0000 mL | Freq: Once | INTRAMUSCULAR | Status: AC | PRN
  Administered 2018-11-26: 11:00:00 100 mL via INTRAVENOUS

## 2018-11-26 MED ORDER — TECHNETIUM TC 99M MEDRONATE IV KIT
20.0000 | PACK | Freq: Once | INTRAVENOUS | Status: AC | PRN
  Administered 2018-11-26: 22 via INTRAVENOUS

## 2018-11-27 ENCOUNTER — Encounter (HOSPITAL_COMMUNITY): Payer: Self-pay

## 2018-11-28 ENCOUNTER — Inpatient Hospital Stay (HOSPITAL_COMMUNITY): Payer: BC Managed Care – PPO

## 2018-11-28 ENCOUNTER — Inpatient Hospital Stay (HOSPITAL_COMMUNITY): Payer: BC Managed Care – PPO | Attending: Hematology | Admitting: Hematology

## 2018-11-28 ENCOUNTER — Encounter (HOSPITAL_COMMUNITY): Payer: Self-pay | Admitting: Hematology

## 2018-11-28 ENCOUNTER — Other Ambulatory Visit: Payer: Self-pay

## 2018-11-28 VITALS — BP 161/88 | HR 66 | Temp 97.1°F | Resp 18 | Ht 66.0 in | Wt 185.9 lb

## 2018-11-28 DIAGNOSIS — Z800 Family history of malignant neoplasm of digestive organs: Secondary | ICD-10-CM | POA: Diagnosis not present

## 2018-11-28 DIAGNOSIS — Z8042 Family history of malignant neoplasm of prostate: Secondary | ICD-10-CM | POA: Insufficient documentation

## 2018-11-28 DIAGNOSIS — Z87891 Personal history of nicotine dependence: Secondary | ICD-10-CM | POA: Diagnosis not present

## 2018-11-28 DIAGNOSIS — Z192 Hormone resistant malignancy status: Secondary | ICD-10-CM | POA: Diagnosis not present

## 2018-11-28 DIAGNOSIS — R232 Flushing: Secondary | ICD-10-CM | POA: Insufficient documentation

## 2018-11-28 DIAGNOSIS — C61 Malignant neoplasm of prostate: Secondary | ICD-10-CM

## 2018-11-28 DIAGNOSIS — Z79899 Other long term (current) drug therapy: Secondary | ICD-10-CM | POA: Diagnosis not present

## 2018-11-28 DIAGNOSIS — Z803 Family history of malignant neoplasm of breast: Secondary | ICD-10-CM | POA: Insufficient documentation

## 2018-11-28 LAB — PSA: Prostatic Specific Antigen: 6.46 ng/mL — ABNORMAL HIGH (ref 0.00–4.00)

## 2018-11-28 NOTE — Progress Notes (Signed)
[facility] NOTE  Patient Care Team: Doree Albee, MD as PCP - General (Internal Medicine)  CHIEF COMPLAINTS/PURPOSE OF CONSULTATION:  Metastatic castration refractory prostate cancer.  HISTORY OF PRESENTING ILLNESS:  Scott [name] 63 y.o. male is seen for evaluation of her metastatic castration refractory prostate cancer at the request of Dr. Irine Seal.  He was diagnosed with prostate cancer in October 2018 with bulky pelvic adenopathy and urinary retention.  PSA was 159.  He underwent TURP on 01/26/2017 with biopsy showing prostatic adenocarcinoma, Gleason 9 (4+5).  He was started on Lupron in November 2018 and received second dose in May 2019.  Because of hot flashes, sleep deprivation and decreased mental clarity, he opted to wait and watch at that time.  As his PSA has gone up to 6 in May 2020, he was started back on Lupron.  His testosterone also went up to 54.  His subsequent PSA came down to 4.37 on 09/08/2018 with castrate level testosterone.  However PSA increased to 4.64 on 11/06/2018 again with castrate level testosterone.  He was started on estradiol patch in February 2020 for symptom management.  He reported that the patch helped him greatly with hot flashes, and other symptoms.  He denies any new onset pains.  He was evaluated by Dr. Jeffie Pollock and a CT scan and bone scan were ordered.  He worked in [employer] in the past.  He quit smoking in 2005, smoked 1 and half pack per day for 35 years.  Family history significant for advanced prostate cancer in his brother in his 2s.  Father also had prostate cancer in his 53s.  Maternal grandmother had breast cancer and maternal grandfather had pancreatic cancer.   MEDICAL HISTORY:  Past Medical History:  Diagnosis Date  . Cancer [facility])    Prostate    SURGICAL HISTORY: Past Surgical History:  Procedure Laterality Date  . HERNIA REPAIR  [id number]   UMBILICAL   . TRANSURETHRAL RESECTION OF PROSTATE N/A  01/25/2017   Procedure: TRANSURETHRAL RESECTION OF THE PROSTATE (TURP);  Surgeon: Alexis Frock, MD;  Location: [facility];  Service: Urology;  Laterality: N/A;  . WISDOM TOOTH EXTRACTION      SOCIAL HISTORY: Social History   Socioeconomic History  . Marital status: Married    Spouse name: Not on file  . Number of children: 1  . Years of education: Not on file  . Highest education level: Not on file  Occupational History  . Not on file  Social Needs  . Financial resource strain: Not hard at all  . Food insecurity    Worry: Never true    Inability: Never true  . Transportation needs    Medical: No    Non-medical: No  Tobacco Use  . Smoking status: Former Smoker    Types: Cigarettes  . Smokeless tobacco: Never Used  . Tobacco comment: OVER 30 YEARS HX OF SMOKING   Substance and Sexual Activity  . Alcohol use: No  . Drug use: No  . Sexual activity: Not on file  Lifestyle  . Physical activity    Days per week: 0 days    Minutes per session: 0 min  . Stress: Not at all  Relationships  . Social Herbalist on phone: Twice a week    Gets together: Once a week    Attends religious service: Never    Active member of club or organization: Yes    Attends meetings of  clubs or organizations: Never    Relationship status: Married  . Intimate partner violence    Fear of current or ex partner: No    Emotionally abused: No    Physically abused: No    Forced sexual activity: No  Other Topics Concern  . Not on file  Social History Narrative  . Not on file    FAMILY HISTORY: Family History  Problem Relation Age of Onset  . Emphysema Mother   . Cancer Father   . Alzheimer's disease Father   . Cancer Brother   . Cancer Maternal Grandmother   . Cancer Maternal Grandfather   . Dementia Paternal Grandmother   . Heart attack Paternal Grandfather     ALLERGIES:  is allergic to wellbutrin [bupropion].  MEDICATIONS:  Current Outpatient Medications  Medication Sig  Dispense Refill  . OVER THE COUNTER MEDICATION daily. CBD oil daily    . acetaminophen (TYLENOL) 500 MG tablet Take 1,000 mg by mouth every 8 (eight) hours as needed for mild pain.    Jolyne Loa Grape-Goldenseal (BERBERINE COMPLEX) 200-200-50 MG CAPS Take 2 capsules by mouth daily.    . Cholecalciferol (D 5000) 5000 units capsule Take 5,000 Units by mouth daily.    Marland Kitchen estradiol (VIVELLE-DOT) 0.1 MG/24HR patch APP 1 PA EXT  2 TIMES WEEKLY .    . ibuprofen (ADVIL,MOTRIN) 200 MG tablet Take 400 mg by mouth every 8 (eight) hours as needed for headache or mild pain.    . Melatonin 10 MG TABS Take 2 tablets by mouth every evening.    . Nattokinase 100 MG CAPS Take 1 capsule by mouth every evening.    Marland Kitchen OVER THE COUNTER MEDICATION Take 2 capsules by mouth daily. Liver Blend : Milk thistle and Dandelion root 475 mg    . OVER THE COUNTER MEDICATION Take 3,000 mg by mouth daily. [place] [employer] Mushroom  Anti-Cancer /Immune Scientist, product/process development    . TURMERIC CURCUMIN PO Take 750 mg by mouth daily.    . vitamin B-12 (CYANOCOBALAMIN) 1000 MCG tablet Take 1,000 mcg by mouth daily.     No current facility-administered medications for this visit.     REVIEW OF SYSTEMS:   Constitutional: Denies fevers, chills or abnormal night sweats Eyes: Denies blurriness of vision, double vision or watery eyes Ears, nose, mouth, throat, and face: Denies mucositis or sore throat Respiratory: Dyspnea on exertion present. Cardiovascular: Denies palpitation, chest discomfort or lower extremity swelling Gastrointestinal:  Denies nausea, heartburn or change in bowel habits Skin: Denies abnormal skin rashes Lymphatics: Denies new lymphadenopathy or easy bruising Neurological:Denies numbness, tingling or new weaknesses Behavioral/Psych: Mood is stable, no new changes  All other systems were reviewed with the patient and are negative.  PHYSICAL EXAMINATION: ECOG PERFORMANCE STATUS: 0 - Asymptomatic  Vitals:   11/28/18 0849  BP:  (!) 161/88  Pulse: 66  Resp: 18  Temp: (!) 97.1 F (36.2 C)  SpO2: 100%   Filed Weights   11/28/18 0849  Weight: 185 lb 14.4 oz (84.3 kg)    GENERAL:alert, no distress and comfortable SKIN: skin color, texture, turgor are normal, no rashes or significant lesions EYES: normal, conjunctiva are pink and non-injected, sclera clear OROPHARYNX:no exudate, no erythema and lips, buccal mucosa, and tongue normal  NECK: supple, thyroid normal size, non-tender, without nodularity LYMPH:  no palpable lymphadenopathy in the cervical, axillary or inguinal LUNGS: clear to auscultation and percussion with normal breathing effort HEART: regular rate & rhythm and no murmurs and no lower extremity  edema ABDOMEN:abdomen soft, non-tender and normal bowel sounds Musculoskeletal:no cyanosis of digits and no clubbing  PSYCH: alert & oriented x 3 with fluent speech NEURO: no focal motor/sensory deficits  LABORATORY DATA:  I have reviewed the data as listed Lab Results  Component Value Date   WBC 4.6 01/23/2017   HGB 14.0 01/26/2017   HCT 39.4 01/26/2017   MCV 87.8 01/23/2017   PLT 290 01/23/2017     Chemistry      Component Value Date/Time   NA 136 04/23/2017 1407   K 4.1 04/23/2017 1407   CL 102 04/23/2017 1407   CO2 25 04/23/2017 1407   BUN 14 04/23/2017 1407   CREATININE 0.71 04/23/2017 1407      Component Value Date/Time   CALCIUM 9.5 04/23/2017 1407   ALKPHOS 67 04/23/2017 1407   AST 21 04/23/2017 1407   ALT 19 04/23/2017 1407   BILITOT 1.0 04/23/2017 1407       RADIOGRAPHIC STUDIES: I have personally reviewed the radiological images as listed and agreed with the findings in the report. Ct Chest W Contrast  Result Date: 11/26/2018 CLINICAL DATA:  Restaging metastatic prostate cancer. Elevated PSA level of 4.64 on 11/06/2018. EXAM: CT CHEST, ABDOMEN, AND PELVIS WITH CONTRAST TECHNIQUE: Multidetector CT imaging of the chest, abdomen and pelvis was performed following the standard  protocol during bolus administration of intravenous contrast. CONTRAST:  162mL OMNIPAQUE IOHEXOL 300 MG/ML  SOLN COMPARISON:  01/01/2017 abdomen/pelvis CT; bone scan from 01/01/2017 FINDINGS: CT CHEST FINDINGS Cardiovascular: Coronary, aortic arch, and branch vessel atherosclerotic vascular disease. Mediastinum/Nodes: Right paratracheal node 0.8 cm in short axis on image 19/2. Right hilar lymph node 0.8 cm in short axis, image 30/2. Left infrahilar lymph node 0.8 cm in short axis, image 31/2. No overt pathologic thoracic adenopathy. Bilateral gynecomastia. Lungs/Pleura: Biapical pleuroparenchymal scarring. 0.5 by 0.4 cm right middle lobe nodule on image 121/4, previously 0.5 by 0.3 cm on 01/01/2017. Musculoskeletal: Well corticated ossicle below the right coracoid. Faint sclerosis laterally in the right sixth rib on image 74/4, nonspecific. CT ABDOMEN PELVIS FINDINGS Hepatobiliary: Unremarkable Pancreas: Unremarkable Spleen: Unremarkable Adrenals/Urinary Tract: Unremarkable Stomach/Bowel: Scattered sigmoid colon diverticula. Otherwise unremarkable. Normal appendix. Vascular/Lymphatic: Aortoiliac atherosclerotic vascular disease. Left external iliac node 0.7 cm in short axis. Reproductive: The prostate gland measures 3.5 by 2.7 by 3.1 cm (volume = 15 cm^3). Prior TURP. Other: No supplemental non-categorized findings. Musculoskeletal: Bilobed ventral hernia contains adipose tissue with mild stranding within the herniated omental adipose tissue and in the adjacent omentum. This has increased in size from 01/01/2017. 7 mm degenerative anterolisthesis at L4-5 with associated central narrowing of the thecal sac and right greater than left foraminal stenosis. Disc bulge, facet arthropathy, and possible disc protrusion and/or synovial cyst on the left cause left foraminal impingement at L5-S1. Schmorl's node along the superior endplate of L3. IMPRESSION: 1. No adenopathy or definite findings of active bony malignancy.  Subtle sclerosis laterally in the right sixth rib is nonspecific but not highly characteristic for metastatic disease. 2. Other imaging findings of potential clinical significance: Aortic Atherosclerosis (ICD10-I70.0). Coronary atherosclerosis. Chronically stable right middle lobe pulmonary nodule, considered benign. Scattered sigmoid colon diverticula. Increased size bilobed ventral hernia containing adipose tissue. Lumbar impingement at L4-5 and L5-S1. Electronically Signed   By: Van Clines M.D.   On: 11/26/2018 12:02   Nm Bone Scan Whole Body  Result Date: 11/26/2018 CLINICAL DATA:  Prostate cancer.  No bone pain EXAM: NUCLEAR MEDICINE WHOLE BODY BONE SCAN TECHNIQUE: Whole body  anterior and posterior images were obtained approximately 3 hours after intravenous injection of radiopharmaceutical. RADIOPHARMACEUTICALS:  Twenty-two mCi Technetium-12m MDP IV COMPARISON:  CT same day 11/26/2018 FINDINGS: No abnormal radiotracer accumulation within the axillary or appendicular skeleton to suggest bone metastasis. Uptake in the posterior aspect of the L4-L5 vertebral bodies laterally corresponds to facet hypertrophy and degenerative change identified on comparison CT scan. IMPRESSION: 1. No evidence of prostate cancer skeletal metastasis. 2. Chronic degenerative change in lower lumbar spine. Electronically Signed   By: Suzy Bouchard M.D.   On: 11/26/2018 16:42   Ct Abdomen Pelvis W Contrast  Result Date: 11/26/2018 CLINICAL DATA:  Restaging metastatic prostate cancer. Elevated PSA level of 4.64 on 11/06/2018. EXAM: CT CHEST, ABDOMEN, AND PELVIS WITH CONTRAST TECHNIQUE: Multidetector CT imaging of the chest, abdomen and pelvis was performed following the standard protocol during bolus administration of intravenous contrast. CONTRAST:  13mL OMNIPAQUE IOHEXOL 300 MG/ML  SOLN COMPARISON:  01/01/2017 abdomen/pelvis CT; bone scan from 01/01/2017 FINDINGS: CT CHEST FINDINGS Cardiovascular: Coronary, aortic  arch, and branch vessel atherosclerotic vascular disease. Mediastinum/Nodes: Right paratracheal node 0.8 cm in short axis on image 19/2. Right hilar lymph node 0.8 cm in short axis, image 30/2. Left infrahilar lymph node 0.8 cm in short axis, image 31/2. No overt pathologic thoracic adenopathy. Bilateral gynecomastia. Lungs/Pleura: Biapical pleuroparenchymal scarring. 0.5 by 0.4 cm right middle lobe nodule on image 121/4, previously 0.5 by 0.3 cm on 01/01/2017. Musculoskeletal: Well corticated ossicle below the right coracoid. Faint sclerosis laterally in the right sixth rib on image 74/4, nonspecific. CT ABDOMEN PELVIS FINDINGS Hepatobiliary: Unremarkable Pancreas: Unremarkable Spleen: Unremarkable Adrenals/Urinary Tract: Unremarkable Stomach/Bowel: Scattered sigmoid colon diverticula. Otherwise unremarkable. Normal appendix. Vascular/Lymphatic: Aortoiliac atherosclerotic vascular disease. Left external iliac node 0.7 cm in short axis. Reproductive: The prostate gland measures 3.5 by 2.7 by 3.1 cm (volume = 15 cm^3). Prior TURP. Other: No supplemental non-categorized findings. Musculoskeletal: Bilobed ventral hernia contains adipose tissue with mild stranding within the herniated omental adipose tissue and in the adjacent omentum. This has increased in size from 01/01/2017. 7 mm degenerative anterolisthesis at L4-5 with associated central narrowing of the thecal sac and right greater than left foraminal stenosis. Disc bulge, facet arthropathy, and possible disc protrusion and/or synovial cyst on the left cause left foraminal impingement at L5-S1. Schmorl's node along the superior endplate of L3. IMPRESSION: 1. No adenopathy or definite findings of active bony malignancy. Subtle sclerosis laterally in the right sixth rib is nonspecific but not highly characteristic for metastatic disease. 2. Other imaging findings of potential clinical significance: Aortic Atherosclerosis (ICD10-I70.0). Coronary atherosclerosis.  Chronically stable right middle lobe pulmonary nodule, considered benign. Scattered sigmoid colon diverticula. Increased size bilobed ventral hernia containing adipose tissue. Lumbar impingement at L4-5 and L5-S1. Electronically Signed   By: Van Clines M.D.   On: 11/26/2018 12:02    ASSESSMENT & PLAN:  Prostate cancer metastatic to multiple sites ([place]) 1.  Metastatic castration refractory prostate cancer: - Diagnosed in November 2018 with bulky pelvic adenopathy.  No record of bone metastasis.  PSA 159. - TURP on 01/26/2017 consistent with prostatic adenocarcinoma, Gleason 4+5= 9. - Started on Lupron in November 2018, stopped after last dose in May 2019 until May 2020.  Lupron was held secondary to severe hot flashes, sleep deprivation and decreased mental clarity. -He was started back on Lupron in May 2020 for elevated PSA of 6 (testosterone 54).  Last PSA from 11/06/2018 was 4.64, up from 4.37 on 09/08/2018 (testosterone was less than 3  both times). -He was started on estradiol 0.1 mg patches approximately in February 2020 which has helped with his hot flashes and other symptoms. -CT CAP on 11/26/2018 shows right paratracheal and right hilar and left infrahilar lymph nodes measuring 0.8 cm.  0.5 cm right middle lobe lung nodule stable.  No adenopathy or definitive evidence of bone mets. -Bone scan on 11/26/2018 did not show any evidence of skeletal metastasis.  Bone scan on 01/01/2017 showed single focus of nonspecific increased tracer localization at the superolateral aspect of the left orbit, uncertain etiology. - We discussed various options including Abiraterone, enzalutamide which are used indefinitely.  I have also recommended continuation of androgen deprivation therapy with Lupron.  In general cytotoxic chemotherapy with docetaxel is reserved for patients with relatively rapidly progressing symptomatic disease. - I have recommended next generation imaging with F18 fluciclovine PET CT  scan. -We will also obtain a PSA level today along with testosterone level. -If there is any evidence of bone metastasis, will consider adding denosumab. -I have recommended germline mutation testing as well as next generation sequencing.  2.  Family history: - Brother had advanced prostate cancer in his 73s.  Father had prostate cancer in his 11s.  Maternal grandmother had breast cancer.  Maternal grandfather had pancreatic cancer. -I am strongly recommended germline mutation testing.  3.  Smoking history: - He smoked 1-1/2 pack/day for 35 years and quit in 2005. -CT chest on 11/26/2018 showed stable 0.5 cm right middle lobe lung nodule.  Orders Placed This Encounter  Procedures  . NM PET (AXUMIN) SKULL BASE TO MID THIGH    Standing Status:   Future    Standing Expiration Date:   01/28/2020    Order Specific Question:   ** REASON FOR EXAM (FREE TEXT)    Answer:   Pancreatic cancer    Order Specific Question:   If indicated for the ordered procedure, I authorize the administration of a radiopharmaceutical per Radiology protocol    Answer:   Yes    Order Specific Question:   Preferred imaging location?    Answer:   [facility]    Order Specific Question:   Radiology Contrast Protocol - do NOT remove file path    Answer:   [email or web address]  . PSA    Standing Status:   Future    Number of Occurrences:   1    Standing Expiration Date:   11/28/2019  . Testosterone    Standing Status:   Future    Number of Occurrences:   1    Standing Expiration Date:   11/28/2019    All questions were answered. The patient knows to call the clinic with any problems, questions or concerns.      [name], MD 11/28/2018 6:00 PM

## 2018-11-28 NOTE — Patient Instructions (Addendum)
[place] at [facility] Discharge Instructions  You were seen today by Dr. Delton Coombes. He went over your history, family history and how you've been feeling lately. He will request all of your records and scans from [facility]. He will get labs drawn today and also schedule you for a special type of PET scan. He will see you back after your scan for follow up.   Thank you for choosing [facility] at [facility] to provide your oncology and hematology care.  To afford each patient quality time with our provider, please arrive at least 15 minutes before your scheduled appointment time.   If you have a lab appointment with the [facility] please come in thru the  Main Entrance and check in at the main information desk  You need to re-schedule your appointment should you arrive 10 or more minutes late.  We strive to give you quality time with our providers, and arriving late affects you and other patients whose appointments are after yours.  Also, if you no show three or more times for appointments you may be dismissed from the clinic at the providers discretion.     Again, thank you for choosing [facility].  Our hope is that these requests will decrease the amount of time that you wait before being seen by our physicians.       _____________________________________________________________  Should you have questions after your visit to [facility], please contact our office at [phone] between the hours of 8:00 a.m. and 4:30 p.m.  Voicemails left after 4:00 p.m. will not be returned until the following business day.  For prescription refill requests, have your pharmacy contact our office and allow 72 hours.    Cancer Center Support Programs:   > Cancer Support Group  2nd Tuesday of the month 1pm-2pm, Journey Room

## 2018-11-28 NOTE — Assessment & Plan Note (Addendum)
1.  Metastatic castration refractory prostate cancer: - Diagnosed in November 2018 with bulky pelvic adenopathy.  No record of bone metastasis.  PSA 159. - TURP on 01/26/2017 consistent with prostatic adenocarcinoma, Gleason 4+5= 9. - Started on Lupron in November 2018, stopped after last dose in May 2019 until May 2020.  Lupron was held secondary to severe hot flashes, sleep deprivation and decreased mental clarity. -He was started back on Lupron in May 2020 for elevated PSA of 6 (testosterone 54).  Last PSA from 11/06/2018 was 4.64, up from 4.37 on 09/08/2018 (testosterone was less than 3 both times). -He was started on estradiol 0.1 mg patches approximately in February 2020 which has helped with his hot flashes and other symptoms. -CT CAP on 11/26/2018 shows right paratracheal and right hilar and left infrahilar lymph nodes measuring 0.8 cm.  0.5 cm right middle lobe lung nodule stable.  No adenopathy or definitive evidence of bone mets. -Bone scan on 11/26/2018 did not show any evidence of skeletal metastasis.  Bone scan on 01/01/2017 showed single focus of nonspecific increased tracer localization at the superolateral aspect of the left orbit, uncertain etiology. - We discussed various options including Abiraterone, enzalutamide which are used indefinitely.  I have also recommended continuation of androgen deprivation therapy with Lupron.  In general cytotoxic chemotherapy with docetaxel is reserved for patients with relatively rapidly progressing symptomatic disease. - I have recommended next generation imaging with F18 fluciclovine PET CT scan. -We will also obtain a PSA level today along with testosterone level. -If there is any evidence of bone metastasis, will consider adding denosumab. -I have recommended germline mutation testing as well as next generation sequencing.  2.  Family history: - Brother had advanced prostate cancer in his 11s.  Father had prostate cancer in his 73s.  Maternal  grandmother had breast cancer.  Maternal grandfather had pancreatic cancer. -I am strongly recommended germline mutation testing.  3.  Smoking history: - He smoked 1-1/2 pack/day for 35 years and quit in 2005. -CT chest on 11/26/2018 showed stable 0.5 cm right middle lobe lung nodule.

## 2018-11-29 LAB — TESTOSTERONE: Testosterone: <3 ng/dL — ABNORMAL LOW (ref 264–916)

## 2018-12-08 ENCOUNTER — Other Ambulatory Visit (HOSPITAL_COMMUNITY): Payer: Self-pay | Admitting: Nurse Practitioner

## 2018-12-11 ENCOUNTER — Other Ambulatory Visit: Payer: Self-pay

## 2018-12-11 ENCOUNTER — Encounter (HOSPITAL_COMMUNITY)
Admission: RE | Admit: 2018-12-11 | Discharge: 2018-12-11 | Disposition: A | Discharge status: Home / Self Care | Payer: BC Managed Care – PPO | Source: Ambulatory Visit | Attending: Hematology | Admitting: Hematology

## 2018-12-11 DIAGNOSIS — C61 Malignant neoplasm of prostate: Secondary | ICD-10-CM | POA: Insufficient documentation

## 2018-12-11 MED ORDER — AXUMIN (FLUCICLOVINE F 18) INJECTION
10.8000 | Freq: Once | INTRAVENOUS | Status: AC
  Administered 2018-12-11: 14:00:00 10.8 via INTRAVENOUS

## 2018-12-15 ENCOUNTER — Inpatient Hospital Stay (HOSPITAL_COMMUNITY): Payer: BC Managed Care – PPO | Admitting: Hematology

## 2018-12-15 ENCOUNTER — Other Ambulatory Visit: Payer: Self-pay

## 2018-12-15 ENCOUNTER — Encounter (HOSPITAL_COMMUNITY): Payer: Self-pay | Admitting: Hematology

## 2018-12-15 VITALS — BP 140/84 | HR 84 | Temp 97.9°F | Resp 1 | Wt 185.6 lb

## 2018-12-15 DIAGNOSIS — C61 Malignant neoplasm of prostate: Secondary | ICD-10-CM

## 2018-12-15 NOTE — Assessment & Plan Note (Signed)
1.  Metastatic castration refractory prostate cancer: - Diagnosed in November 2018 with bulky pelvic adenopathy.  No record of bone metastasis.  PSA 159. - TURP on 01/26/2017 consistent with prostatic adenocarcinoma, Gleason 4+5= 9. - Started on Lupron in November 2018, stopped after last dose in May 2019 until May 2020.  Lupron was held secondary to severe hot flashes, sleep deprivation and decreased mental clarity. -He was started back on Lupron in May 2020 for elevated PSA of 6 (testosterone 54).  Last PSA from 11/06/2018 was 4.64, up from 4.37 on 09/08/2018 (testosterone was less than 3 both times). -He was started on estradiol 0.1 mg patches approximately in February 2020 which has helped with his hot flashes and other symptoms. -CT CAP on 11/26/2018 shows right paratracheal and right hilar and left infrahilar lymph nodes measuring 0.8 cm.  0.5 cm right middle lobe lung nodule stable.  No adenopathy or definitive evidence of bone mets. -Bone scan on 11/26/2018 did not show any evidence of skeletal metastasis.  Bone scan on 01/01/2017 showed single focus of nonspecific increased tracer localization at the superolateral aspect of the left orbit, uncertain etiology. - Axumin scan on 12/11/2018 showed small subcentimeter periaortic lymph nodes and left external iliac lymph nodes with radiotracer activity less than or equal to marrow activity.  Asymmetric activity in the right lobe of the prostate gland could represent residual carcinoma.  No evidence of distant metastatic disease or skeletal metastasis. - PSA is elevated at 6.46.  Testosterone level is less than 3. - I had a prolonged discussion about further management options with Abiraterone and prednisone.  We discussed the side effects in detail.  Patient is reluctant to consider Abiraterone at this time because of the side effect profile.  He is currently working full-time and does not want to have any side effects. - He asked me about high-dose estrogen.   I do not think it is more effective than Abiraterone. - We have agreed to wait for couple of months and repeat a PSA level.  If the PSA level goes above 10, will consider Abiraterone at that point.   2.  Family history: - Brother had advanced prostate cancer in his 16s.  Father had prostate cancer in his 22s.  Maternal grandmother had breast cancer.  Maternal grandfather had pancreatic cancer. -We will do germline mutation testing.  3.  Smoking history: - He smoked 1-1/2 pack/day for 35 years and quit in 2005. -CT chest on 11/26/2018 showed stable 0.5 cm right middle lobe lung nodule.

## 2018-12-15 NOTE — Progress Notes (Signed)
Scott [name], [place] [address]   CLINIC:  Medical Oncology/Hematology  PCP:  Doree Albee, MD [place] [id number] [phone]   REASON FOR VISIT:  Follow-up for prostate cancer.  CURRENT THERAPY: Lupron every 6 months.    INTERVAL HISTORY:  Scott [name] 63 y.o. male seen for follow-up of castration refractory prostate cancer.  Last Lupron injection was in May 2020.  He had PET CT scan done on 12/11/2018.  He is working full-time job.  Appetite is 100%.  Energy levels are 50%.  No pains reported.  He does have hot flashes.  Estrogen patch is helping with it.    REVIEW OF SYSTEMS:  Review of Systems  Endocrine: Positive for hot flashes.  All other systems reviewed and are negative.    PAST MEDICAL/SURGICAL HISTORY:  Past Medical History:  Diagnosis Date  . Cancer [facility])    Prostate   Past Surgical History:  Procedure Laterality Date  . HERNIA REPAIR  [id number]   UMBILICAL   . TRANSURETHRAL RESECTION OF PROSTATE N/A 01/25/2017   Procedure: TRANSURETHRAL RESECTION OF THE PROSTATE (TURP);  Surgeon: Alexis Frock, MD;  Location: [facility];  Service: Urology;  Laterality: N/A;  . WISDOM TOOTH EXTRACTION       SOCIAL HISTORY:  Social History   Socioeconomic History  . Marital status: Married    Spouse name: Not on file  . Number of children: 1  . Years of education: Not on file  . Highest education level: Not on file  Occupational History  . Not on file  Social Needs  . Financial resource strain: Not hard at all  . Food insecurity    Worry: Never true    Inability: Never true  . Transportation needs    Medical: No    Non-medical: No  Tobacco Use  . Smoking status: Former Smoker    Types: Cigarettes  . Smokeless tobacco: Never Used  . Tobacco comment: OVER 30 YEARS HX OF SMOKING   Substance and Sexual Activity  . Alcohol use: No  . Drug use: No  . Sexual activity: Not on file  Lifestyle  . Physical  activity    Days per week: 0 days    Minutes per session: 0 min  . Stress: Not at all  Relationships  . Social Herbalist on phone: Twice a week    Gets together: Once a week    Attends religious service: Never    Active member of club or organization: Yes    Attends meetings of clubs or organizations: Never    Relationship status: Married  . Intimate partner violence    Fear of current or ex partner: No    Emotionally abused: No    Physically abused: No    Forced sexual activity: No  Other Topics Concern  . Not on file  Social History Narrative  . Not on file    FAMILY HISTORY:  Family History  Problem Relation Age of Onset  . Emphysema Mother   . Cancer Father   . Alzheimer's disease Father   . Cancer Brother   . Cancer Maternal Grandmother   . Cancer Maternal Grandfather   . Dementia Paternal Grandmother   . Heart attack Paternal Grandfather     CURRENT MEDICATIONS:  Outpatient Encounter Medications as of 12/15/2018  Medication Sig  . Barberry-Oreg Grape-Goldenseal (BERBERINE COMPLEX) 200-200-50 MG CAPS Take 2 capsules by mouth daily.  Marland Kitchen  Cholecalciferol (D 5000) 5000 units capsule Take 5,000 Units by mouth daily.  Marland Kitchen estradiol (VIVELLE-DOT) 0.1 MG/24HR patch APP 1 PA EXT  2 TIMES WEEKLY .  . Melatonin 10 MG TABS Take 2 tablets by mouth every evening.  . Nattokinase 100 MG CAPS Take 1 capsule by mouth every evening.  Marland Kitchen OVER THE COUNTER MEDICATION Take 2 capsules by mouth daily. Liver Blend : Milk thistle and Dandelion root 475 mg  . OVER THE COUNTER MEDICATION Take 3,000 mg by mouth daily. [place] Tail Mushroom  Anti-Cancer /Immune Scientist, product/process development  . OVER THE COUNTER MEDICATION daily. CBD oil daily  . TURMERIC CURCUMIN PO Take 750 mg by mouth daily.  . vitamin B-12 (CYANOCOBALAMIN) 1000 MCG tablet Take 1,000 mcg by mouth daily.  Marland Kitchen acetaminophen (TYLENOL) 500 MG tablet Take 1,000 mg by mouth every 8 (eight) hours as needed for mild pain.  Marland Kitchen ibuprofen (ADVIL,MOTRIN)  200 MG tablet Take 400 mg by mouth every 8 (eight) hours as needed for headache or mild pain.   No facility-administered encounter medications on file as of 12/15/2018.     ALLERGIES:  Allergies  Allergen Reactions  . Wellbutrin [Bupropion]     Hives      PHYSICAL EXAM:  ECOG Performance status: 0  Vitals:   12/15/18 1039  BP: 140/84  Pulse: 84  Resp: (!) 1  Temp: 97.9 F (36.6 C)  SpO2: 95%   Filed Weights   12/15/18 1039  Weight: 185 lb 9.6 oz (84.2 kg)    Physical Exam Vitals signs reviewed.  Constitutional:      Appearance: Normal appearance.  Cardiovascular:     Rate and Rhythm: Normal rate and regular rhythm.     Heart sounds: Normal heart sounds.  Pulmonary:     Effort: Pulmonary effort is normal.     Breath sounds: Normal breath sounds.  Abdominal:     General: There is no distension.     Palpations: Abdomen is soft. There is no mass.  Musculoskeletal:        General: No swelling.  Skin:    General: Skin is warm.  Neurological:     General: No focal deficit present.     Mental Status: He is alert and oriented to person, place, and time.  Psychiatric:        Mood and Affect: Mood normal.        Behavior: Behavior normal.      LABORATORY DATA:  I have reviewed the labs as listed.  CBC    Component Value Date/Time   WBC 4.6 01/23/2017 1041   RBC 4.69 01/23/2017 1041   HGB 14.0 01/26/2017 0457   HCT 39.4 01/26/2017 0457   PLT 290 01/23/2017 1041   MCV 87.8 01/23/2017 1041   MCH 31.8 01/23/2017 1041   MCHC 36.2 (H) 01/23/2017 1041   RDW 12.0 01/23/2017 1041   LYMPHSABS 1.3 12/18/2016 1118   MONOABS 0.6 12/18/2016 1118   EOSABS 0.0 12/18/2016 1118   BASOSABS 0.0 12/18/2016 1118   CMP Latest Ref Rng & Units 04/23/2017 01/26/2017 01/23/2017  Glucose 65 - 99 mg/dL 132(H) 111(H) 106(H)  BUN 6 - 20 mg/dL 14 12 9   Creatinine 0.61 - 1.24 mg/dL 0.71 0.61 0.76  Sodium 135 - 145 mmol/L 136 132(L) 134(L)  Potassium 3.5 - 5.1 mmol/L 4.1 4.4 4.3   Chloride 101 - 111 mmol/L 102 99(L) 100(L)  CO2 22 - 32 mmol/L 25 24 26   Calcium 8.9 - 10.3 mg/dL 9.5 9.3 9.4  Total Protein 6.5 - 8.1 g/dL 6.5 - -  Total Bilirubin 0.3 - 1.2 mg/dL 1.0 - -  Alkaline Phos 38 - 126 U/L 67 - -  AST 15 - 41 U/L 21 - -  ALT 17 - 63 U/L 19 - -       DIAGNOSTIC IMAGING:  I have independently reviewed the scans and discussed with the patient.    ASSESSMENT & PLAN:   Prostate cancer metastatic to multiple sites ([place]) 1.  Metastatic castration refractory prostate cancer: - Diagnosed in November 2018 with bulky pelvic adenopathy.  No record of bone metastasis.  PSA 159. - TURP on 01/26/2017 consistent with prostatic adenocarcinoma, Gleason 4+5= 9. - Started on Lupron in November 2018, stopped after last dose in May 2019 until May 2020.  Lupron was held secondary to severe hot flashes, sleep deprivation and decreased mental clarity. -He was started back on Lupron in May 2020 for elevated PSA of 6 (testosterone 54).  Last PSA from 11/06/2018 was 4.64, up from 4.37 on 09/08/2018 (testosterone was less than 3 both times). -He was started on estradiol 0.1 mg patches approximately in February 2020 which has helped with his hot flashes and other symptoms. -CT CAP on 11/26/2018 shows right paratracheal and right hilar and left infrahilar lymph nodes measuring 0.8 cm.  0.5 cm right middle lobe lung nodule stable.  No adenopathy or definitive evidence of bone mets. -Bone scan on 11/26/2018 did not show any evidence of skeletal metastasis.  Bone scan on 01/01/2017 showed single focus of nonspecific increased tracer localization at the superolateral aspect of the left orbit, uncertain etiology. - Axumin scan on 12/11/2018 showed small subcentimeter periaortic lymph nodes and left external iliac lymph nodes with radiotracer activity less than or equal to marrow activity.  Asymmetric activity in the right lobe of the prostate gland could represent residual carcinoma.  No evidence of  distant metastatic disease or skeletal metastasis. - PSA is elevated at 6.46.  Testosterone level is less than 3. - I had a prolonged discussion about further management options with Abiraterone and prednisone.  We discussed the side effects in detail.  Patient is reluctant to consider Abiraterone at this time because of the side effect profile.  He is currently working full-time and does not want to have any side effects. - He asked me about high-dose estrogen.  I do not think it is more effective than Abiraterone. - We have agreed to wait for couple of months and repeat a PSA level.  If the PSA level goes above 10, will consider Abiraterone at that point.   2.  Family history: - Brother had advanced prostate cancer in his 72s.  Father had prostate cancer in his 37s.  Maternal grandmother had breast cancer.  Maternal grandfather had pancreatic cancer. -We will do germline mutation testing.  3.  Smoking history: - He smoked 1-1/2 pack/day for 35 years and quit in 2005. -CT chest on 11/26/2018 showed stable 0.5 cm right middle lobe lung nodule.   Total time spent is 25 minutes with more than 50% of the time spent face-to-face discussing scan results, treatment options, counseling and coordination of care.  Orders placed this encounter:  Orders Placed This Encounter  Procedures  . CBC with Differential/Platelet  . Comprehensive metabolic panel  . PSA  . Testosterone      [name], MD [place] [phone]

## 2018-12-15 NOTE — Patient Instructions (Addendum)
[place] at [facility] Discharge Instructions  You were seen today by Dr. Delton Coombes. He went over your recent lab results. He discussed starting you on Zytiga. He discussed side effects and symptoms you may experience. He also discussed waiting and rechecking your levels. If your PSA gets to 10 he would like you to start the [place]. He will see you back in 2 months for labs and follow up.   Thank you for choosing [place] at [facility] to provide your oncology and hematology care.  To afford each patient quality time with our provider, please arrive at least 15 minutes before your scheduled appointment time.   If you have a lab appointment with the [facility] please come in thru the  Main Entrance and check in at the main information desk  You need to re-schedule your appointment should you arrive 10 or more minutes late.  We strive to give you quality time with our providers, and arriving late affects you and other patients whose appointments are after yours.  Also, if you no show three or more times for appointments you may be dismissed from the clinic at the providers discretion.     Again, thank you for choosing [facility].  Our hope is that these requests will decrease the amount of time that you wait before being seen by our physicians.       _____________________________________________________________  Should you have questions after your visit to [facility], please contact our office at [phone] between the hours of 8:00 a.m. and 4:30 p.m.  Voicemails left after 4:00 p.m. will not be returned until the following business day.  For prescription refill requests, have your pharmacy contact our office and allow 72 hours.    Cancer Center Support Programs:   > Cancer Support Group  2nd Tuesday of the month 1pm-2pm, Journey Room

## 2019-01-21 ENCOUNTER — Encounter: Payer: Self-pay | Admitting: Genetic Counselor

## 2019-01-23 ENCOUNTER — Ambulatory Visit: Payer: BC Managed Care – PPO | Admitting: Urology

## 2019-01-26 ENCOUNTER — Encounter (INDEPENDENT_AMBULATORY_CARE_PROVIDER_SITE_OTHER): Payer: Self-pay | Admitting: Internal Medicine

## 2019-01-26 ENCOUNTER — Ambulatory Visit (INDEPENDENT_AMBULATORY_CARE_PROVIDER_SITE_OTHER): Payer: BC Managed Care – PPO | Admitting: Internal Medicine

## 2019-01-26 ENCOUNTER — Other Ambulatory Visit: Payer: Self-pay

## 2019-01-26 VITALS — BP 170/90 | HR 64 | Ht 66.0 in | Wt 189.0 lb

## 2019-01-26 DIAGNOSIS — I10 Essential (primary) hypertension: Secondary | ICD-10-CM

## 2019-01-26 DIAGNOSIS — E559 Vitamin D deficiency, unspecified: Secondary | ICD-10-CM

## 2019-01-26 DIAGNOSIS — Z1159 Encounter for screening for other viral diseases: Secondary | ICD-10-CM

## 2019-01-26 DIAGNOSIS — Z0001 Encounter for general adult medical examination with abnormal findings: Secondary | ICD-10-CM | POA: Diagnosis not present

## 2019-01-26 DIAGNOSIS — E782 Mixed hyperlipidemia: Secondary | ICD-10-CM

## 2019-01-26 DIAGNOSIS — E785 Hyperlipidemia, unspecified: Secondary | ICD-10-CM

## 2019-01-26 HISTORY — DX: Essential (primary) hypertension: I10

## 2019-01-26 HISTORY — DX: Hyperlipidemia, unspecified: E78.5

## 2019-01-26 HISTORY — DX: Vitamin D deficiency, unspecified: E55.9

## 2019-01-26 NOTE — Patient Instructions (Signed)
[facility] Dietary Recommendations for Weight Loss What to Avoid . Avoid added sugars o Often added sugar can be found in processed foods such as many condiments, dry cereals, cakes, cookies, chips, crisps, crackers, candies, sweetened drinks, etc.  o Read labels and AVOID/DECREASE use of foods with the following in their ingredient list: Sugar, fructose, high fructose corn syrup, sucrose, glucose, maltose, dextrose, molasses, cane sugar, brown sugar, any type of syrup, agave nectar, etc.   . Avoid snacking in between meals . Avoid foods made with flour o If you are going to eat food made with flour, choose those made with whole-grains; and, minimize your consumption as much as is tolerable . Avoid processed foods o These foods are generally stocked in the middle of the grocery store. Focus on shopping on the perimeter of the grocery.  What to Include . Vegetables o GREEN LEAFY VEGETABLES: Kale, spinach, mustard greens, collard greens, cabbage, broccoli, etc. o OTHER: Asparagus, cauliflower, eggplant, carrots, peas, Brussel sprouts, tomatoes, bell peppers, zucchini, beets, cucumbers, etc. . Grains, seeds, and legumes o Beans: kidney beans, black eyed peas, garbanzo beans, black beans, pinto beans, etc. o Whole, unrefined grains: brown rice, barley, bulgur, oatmeal, etc. . Healthy fats  o Avoid highly processed fats such as vegetable oil o Examples of healthy fats: avocado, olives, virgin olive oil, dark chocolate (?72% Cocoa), nuts (peanuts, almonds, walnuts, cashews, pecans, etc.) . Low - Moderate Intake of Animal Sources of Protein o Meat sources: chicken, turkey, salmon, tuna. Limit to 4 ounces of meat at one time. o Consider limiting dairy sources, but when choosing dairy focus on: PLAIN Greek yogurt, cottage cheese, high-protein milk . Fruit o Choose berries  When to Eat . Intermittent Fasting: o Choosing not to eat for a specific time period, but DO FOCUS ON HYDRATION  when fasting o Multiple Techniques: - Time Restricted Eating: eat 3 meals in a day, each meal lasting no more than 60 minutes, no snacks between meals - 16-18 hour fast: fast for 16 to 18 hours up to 7 days a week. Often suggested to start with 2-3 nonconsecutive days per week.  . Remember the time you sleep is counted as fasting.  . Examples of eating schedule: Fast from 7:00pm-11:00am. Eat between 11:00am-7:00pm.  - 24-hour fast: fast for 24 hours up to every other day. Often suggested to start with 1 day per week . Remember the time you sleep is counted as fasting . Examples of eating schedule:  o Eating day: eat 2-3 meals on your eating day. If doing 2 meals, each meal should last no more than 90 minutes. If doing 3 meals, each meal should last no more than 60 minutes. Finish last meal by 7:00pm. o Fasting day: Fast until 7:00pm.  o IF YOU FEEL UNWELL FOR ANY REASON/IN ANY WAY WHEN FASTING, STOP FASTING BY EATING A NUTRITIOUS SNACK OR LIGHT MEAL o ALWAYS FOCUS ON HYDRATION DURING FASTS - Acceptable Hydration sources: water, broths, tea/coffee (black tea/coffee is best but using a small amount of whole-fat dairy products in coffee/tea is acceptable).  - Poor Hydration Sources: anything with sugar or artificial sweeteners added to it  These recommendations have been developed for patients that are actively receiving medical care from either Dr. Marigrace Mccole or Sarah Gray, DNP, NP-C at [facility]. These recommendations are developed for patients with specific medical conditions and are not meant to be distributed or used by others that are not actively receiving care from either provider listed   above at [name]. It is not appropriate to participate in the above eating plans without proper medical supervision.   Reference: Fung, J. The obesity code. [place]: [place]; 2016.   

## 2019-01-26 NOTE — Progress Notes (Signed)
Chief Complaint: This 63 year old man comes in for his annual physical exam. HPI: He has been diagnosed with prostate cancer several years ago and ended up having TURP and does see a urologist and also now an oncologist.  His PSA had been rising and he went to see an oncologist at the end of September and he is due to have repeat PSA done in the next month or so and has been recommended chemotherapy if PSA is increasing.  He continues to take estradiol patch which is significantly improved his life and how he feels.  He does take Lupron injections again.  He last took an injection I believe in May of this year. He does have elevated blood pressure and he is reluctant to have medication at the present time as it is somewhat variable. He does have vitamin D deficiency and he takes good dose of vitamin D3 supplementation, his last vitamin D level was 76 which is very reasonable. He also takes several over-the-counter supplements.  These are listed below.  Past Medical History:  Diagnosis Date  . Cancer [facility] 2)    Prostate  . Essential hypertension, benign 01/26/2019  . HLD (hyperlipidemia) 01/26/2019  . Vitamin D deficiency disease 01/26/2019   Past Surgical History:  Procedure Laterality Date  . HERNIA REPAIR  [id number]   UMBILICAL   . PROSTATE SURGERY    . TONSILLECTOMY Bilateral   . TRANSURETHRAL RESECTION OF PROSTATE N/A 01/25/2017   Procedure: TRANSURETHRAL RESECTION OF THE PROSTATE (TURP);  Surgeon: Alexis Frock, MD;  Location: [facility];  Service: Urology;  Laterality: N/A;  . WISDOM TOOTH EXTRACTION       Social History   Social History Narrative   Married for 19 years.Works for [employer] from home.    Social History   Tobacco Use  . Smoking status: Former Smoker    Types: Cigarettes    Quit date: 01/12/2004    Years since quitting: 15.0  . Smokeless tobacco: Never Used  . Tobacco comment: OVER 30 YEARS HX OF SMOKING   Substance Use Topics  . Alcohol use: No       Allergies:  Allergies  Allergen Reactions  . Wellbutrin [Bupropion]     Hives      Current Meds  Medication Sig  . Barberry-Oreg Grape-Goldenseal (BERBERINE COMPLEX) 200-200-50 MG CAPS Take 2 capsules by mouth daily.  . Cholecalciferol (D 5000) 5000 units capsule Take 20,000 Units by mouth daily.   Marland Kitchen estradiol (VIVELLE-DOT) 0.1 MG/24HR patch APP 1 PA EXT  2 TIMES WEEKLY .  . Leuprolide Acetate, 6 Month, (LUPRON DEPOT, 39-MONTH,) 45 MG injection Inject 45 mg into the muscle every 6 (six) months.  . Melatonin 10 MG TABS Take 2 tablets by mouth every evening.  . Nattokinase 100 MG CAPS Take 1 capsule by mouth every evening.  Marland Kitchen OVER THE COUNTER MEDICATION Take 2 capsules by mouth daily. Liver Blend : Milk thistle and Dandelion root 475 mg  . OVER THE COUNTER MEDICATION Take 3,000 mg by mouth daily. [place] Tail Mushroom  Anti-Cancer /Immune Scientist, product/process development  . OVER THE COUNTER MEDICATION daily. CBD oil daily  . Quercetin 250 MG TABS Take 500 mg by mouth 2 (two) times daily.  . TURMERIC CURCUMIN PO Take 750 mg by mouth daily.  . vitamin B-12 (CYANOCOBALAMIN) 1000 MCG tablet Take 1,000 mcg by mouth daily.     Nutrition He tends to do intermittent fasting on most days of the week.  He has not been able to  lose significant weight so far.  Sleep Adequate.  Exercise None consistent.    [id number] from the symptoms mentioned above,there are no other symptoms referable to all systems reviewed.  Physical Exam: Blood pressure (!) 170/90, pulse 64, height 5\' 6"  (1.676 m), weight 189 lb (85.7 kg). Vitals with BMI 01/26/2019 12/15/2018 11/28/2018  Height 5\' 6"  - 5\' 6"   Weight 189 lbs 185 lbs 10 oz 185 lbs 14 oz  BMI 30.52 0000000 0000000  Systolic [id number] [id number]  Diastolic 90 84 88  Pulse 64 84 66      He looks systemically well.  He is obese. General: Alert, cooperative, and appears to be the stated age.No pallor.  No jaundice.  No clubbing. Head: Normocephalic Eyes: Sclera white, pupils equal and  reactive to light, red reflex x 2,  Ears: Normal bilaterally Oral cavity: Lips, mucosa, and tongue normal: Teeth and gums normal Neck: No adenopathy, supple, symmetrical, trachea midline, and thyroid does not appear enlarged Respiratory: Clear to auscultation bilaterally.No wheezing, crackles or bronchial breathing. Cardiovascular: Heart sounds are present and appear to be normal without murmurs or added sounds.  No carotid bruits.  Peripheral pulses are present and equal bilaterally.: Gastrointestinal:positive bowel sounds, no hepatosplenomegaly.  No masses felt.No tenderness. Skin: Clear, No rashes noted.No worrisome skin lesions seen. Neurological: Grossly intact without focal findings, cranial nerves II through XII intact, muscle strength equal bilaterally Musculoskeletal: No acute joint abnormalities noted.Full range of movement noted with joints. Psychiatric: Affect appropriate, non-anxious.    Assessment  1. Encounter for hepatitis C screening test for low risk patient   2. Essential hypertension, benign   3. Mixed hyperlipidemia   4. Vitamin D deficiency disease   5. Encounter for general adult medical examination with abnormal findings     Tests Ordered:   Orders Placed This Encounter  Procedures  . Lipid Panel  . Hep C Antibody     Plan  1. Blood work is ordered as above. 2. We had a discussion regarding therapy for his prostate cancer and he is very reluctant to undergo chemotherapy if the oncologist recommend this.  We discussed the benefit of higher estradiol levels to the point where we can suppress his PSA and then possibly consider testosterone therapy.  This recommendation was based on studies suggesting the use of testosterone therapy in the treatment of prostate cancer.  He did have a PET scan which was very positive in terms of metastatic spread. 3. Further recommendations will depend on blood results and I will see him in about 6 months time for follow-up.      No orders of the defined types were placed in this encounter.    Finnick Orosz C [place]   01/26/2019, 1:44 PM

## 2019-01-27 LAB — LIPID PANEL
Cholesterol: 240 mg/dL — ABNORMAL HIGH (ref <200)
HDL: 48 mg/dL (ref >=40)
LDL Cholesterol (Calc): 158 mg/dL (calc) — ABNORMAL HIGH
Non-HDL Cholesterol (Calc): 192 mg/dL (calc) — ABNORMAL HIGH (ref <130)
Total CHOL/HDL Ratio: 5 (calc) — ABNORMAL HIGH (ref <5.0)
Triglycerides: 181 mg/dL — ABNORMAL HIGH (ref <150)

## 2019-01-27 LAB — HEPATITIS C ANTIBODY
Hepatitis C Ab: NONREACTIVE
SIGNAL TO CUT-OFF: 0.01 (ref <1.00)

## 2019-01-29 ENCOUNTER — Encounter (HOSPITAL_COMMUNITY): Payer: BC Managed Care – PPO | Admitting: Genetic Counselor

## 2019-01-29 ENCOUNTER — Other Ambulatory Visit (HOSPITAL_COMMUNITY): Payer: BC Managed Care – PPO

## 2019-02-10 ENCOUNTER — Other Ambulatory Visit: Payer: Self-pay

## 2019-02-10 ENCOUNTER — Inpatient Hospital Stay (HOSPITAL_COMMUNITY): Payer: BC Managed Care – PPO | Attending: Hematology

## 2019-02-10 ENCOUNTER — Other Ambulatory Visit (HOSPITAL_COMMUNITY): Payer: BC Managed Care – PPO

## 2019-02-10 DIAGNOSIS — R319 Hematuria, unspecified: Secondary | ICD-10-CM

## 2019-02-10 DIAGNOSIS — C61 Malignant neoplasm of prostate: Secondary | ICD-10-CM | POA: Diagnosis present

## 2019-02-10 LAB — COMPREHENSIVE METABOLIC PANEL
ALT: 19 U/L (ref 0–44)
AST: 19 U/L (ref 15–41)
Albumin: 4.1 g/dL (ref 3.5–5.0)
Alkaline Phosphatase: 68 U/L (ref 38–126)
Anion gap: 8 (ref 5–15)
BUN: 10 mg/dL (ref 8–23)
CO2: 23 mmol/L (ref 22–32)
Calcium: 8.9 mg/dL (ref 8.9–10.3)
Chloride: 101 mmol/L (ref 98–111)
Creatinine, Ser: 0.62 mg/dL (ref 0.61–1.24)
GFR calc Af Amer: >60 mL/min (ref >60)
GFR calc non Af Amer: >60 mL/min (ref >60)
Glucose, Bld: 105 mg/dL — ABNORMAL HIGH (ref 70–99)
Potassium: 4.3 mmol/L (ref 3.5–5.1)
Sodium: 132 mmol/L — ABNORMAL LOW (ref 135–145)
Total Bilirubin: 1 mg/dL (ref 0.3–1.2)
Total Protein: 6.9 g/dL (ref 6.5–8.1)

## 2019-02-10 LAB — CBC WITH DIFFERENTIAL/PLATELET
Abs Immature Granulocytes: 0.01 10*3/uL (ref 0.00–0.07)
Basophils Absolute: 0.1 10*3/uL (ref 0.0–0.1)
Basophils Relative: 1 %
Eosinophils Absolute: 0.2 10*3/uL (ref 0.0–0.5)
Eosinophils Relative: 3 %
HCT: 40.1 % (ref 39.0–52.0)
Hemoglobin: 13.8 g/dL (ref 13.0–17.0)
Immature Granulocytes: 0 %
Lymphocytes Relative: 34 %
Lymphs Abs: 2.2 10*3/uL (ref 0.7–4.0)
MCH: 31.2 pg (ref 26.0–34.0)
MCHC: 34.4 g/dL (ref 30.0–36.0)
MCV: 90.5 fL (ref 80.0–100.0)
Monocytes Absolute: 0.6 10*3/uL (ref 0.1–1.0)
Monocytes Relative: 9 %
Neutro Abs: 3.4 10*3/uL (ref 1.7–7.7)
Neutrophils Relative %: 53 %
Platelets: 252 10*3/uL (ref 150–400)
RBC: 4.43 MIL/uL (ref 4.22–5.81)
RDW: 11.9 % (ref 11.5–15.5)
WBC: 6.4 10*3/uL (ref 4.0–10.5)
nRBC: 0 % (ref 0.0–0.2)

## 2019-02-10 LAB — PSA: Prostatic Specific Antigen: 7.31 ng/mL — ABNORMAL HIGH (ref 0.00–4.00)

## 2019-02-11 LAB — TESTOSTERONE: Testosterone: <3 ng/dL — ABNORMAL LOW (ref 264–916)

## 2019-02-17 ENCOUNTER — Encounter (HOSPITAL_COMMUNITY): Payer: Self-pay | Admitting: Hematology

## 2019-02-17 ENCOUNTER — Inpatient Hospital Stay (HOSPITAL_COMMUNITY): Payer: BC Managed Care – PPO | Attending: Hematology | Admitting: Hematology

## 2019-02-17 DIAGNOSIS — C61 Malignant neoplasm of prostate: Secondary | ICD-10-CM | POA: Diagnosis not present

## 2019-02-17 NOTE — Progress Notes (Signed)
Virtual Visit via Video Note  I connected with Scott [name] on 02/17/19 at  3:20 PM EST by a video enabled telemedicine application and verified that I am speaking with the correct person using two identifiers.   I discussed the limitations of evaluation and management by telemedicine and the availability of in person appointments. The patient expressed understanding and agreed to proceed.  History of Present Illness: He was being followed in the clinic for metastatic castration refractory prostate cancer.  He was initially diagnosed in November 2018 with bulky pelvic adenopathy.  No record of bone metastasis at that time.  PSA was 159.  He underwent TURP on 01/26/2017 consistent with prostatic adenocarcinoma, Gleason 4+5 equal to 9.  He was on intermittent Lupron since then.  He was started back on Lupron in May 2020 for elevated PSA of 6, testosterone 54.  PSA trended down initially to as low as 4.37 and started trending back up.   Observations/Objective: He denies any new onset pains.  He is accompanied by his wife today in the virtual video visit.  He does report some tiredness secondary to restarting of Lupron.  However he is able to do full-time job.  He is continuing estradiol 0.1 mg patch which helps with his hot flashes.  He had one episode of hematuria over the last weekend.  Denies any dysuria.  Assessment and Plan:  1.  Metastatic castrate refractory prostate cancer:  -Axumin scan on 12/11/2018 showed small subcentimeter periaortic lymph nodes and left external iliac lymph nodes with radiotracer activity less than or equal to bone marrow activity.  Asymmetric activity in the right lobe of the prostate gland could represent residual carcinoma.  No evidence of distant metastatic disease or skeletal metastasis. -We reviewed results of PSA from 02/10/2019 which was 7.31.  PSA from 11/28/2018 was 6.46.  PSA on 11/06/2018 was 4.64. -We had previously talked about further management options  including abiraterone and prednisone.  He was reluctant to consider it. -He asked me about high-dose estrogen which I did not think is more effective than abiraterone.  He was also wondering if Avodart can be used in this situation as he had read some articles about it.  I have asked him to send me the articles to review. -I have encouraged him to seek a consultation with Dr. Tammi Klippel to see if localized radiation to the prostate is a feasible option to slow down worsening PSA levels as he is reluctant to consider any systemic therapy at this time.  He is agreeable.  We will make a referral. -I plan to check him back in 2 months with repeat PSA level.  2.  Family history: -Brother had advanced prostate cancer in his 28s.  Father had prostate cancer in his 37s.  Maternal grandmother had breast cancer.  Maternal grandfather had pancreatic cancer.  We will do germline mutation testing.   Follow Up Instructions: RTC 2 months with labs.   I discussed the assessment and treatment plan with the patient. The patient was provided an opportunity to ask questions and all were answered. The patient agreed with the plan and demonstrated an understanding of the instructions.   The patient was advised to call back or seek an in-person evaluation if the symptoms worsen or if the condition fails to improve as anticipated.  I provided 23 minutes of non-face-to-face time during this encounter.   [name], MD

## 2019-02-23 ENCOUNTER — Encounter: Payer: Self-pay | Admitting: Radiation Oncology

## 2019-02-23 NOTE — Progress Notes (Signed)
GU Location of Tumor / Histology: Metastatic castration refractory prostate cancer. He was initially diagnosed in November 2018 with bulky pelvic adenopathy.   If Prostate Cancer, Gleason Score is (4 + 5) and PSA is (159) at diagnosis.  Trevor Mace underwent TURP on 01/26/2017 consistent with prostatic adenocarcinoma. He has been on Lupron intermittent since. He started back in May 2020 for elevated PSA of 6. PSA trended down initially to as low as 4.37 and started trending back up.     Past/Anticipated interventions by urology, if any: TURP  Past/Anticipated interventions by medical oncology, if any: active surveillance, Lupron, axumin scan 12/11/18:showed small subcentimeter periaortic lymph nodes and left external iliac lymph nodes with radiotracer activity less than or equal to bone marrow activity.  Asymmetric activity in the right lobe of the prostate gland could represent residual carcinoma.  No evidence of distant metastatic disease or skeletal metastasis, referral to Dr. Tammi Klippel for consideration of localized radiation to prostate.  Weight changes, if any: no  Bowel/Bladder complaints, if any: IPSS 23. SHIM 1.  Denies dysuria or hematuria. Reports occasionally leakage related to urinary urgency and intermittent post void dribble. Denies any bowel complaints.    Nausea/Vomiting, if any: no  Pain issues, if any:  denies  SAFETY ISSUES:  Prior radiation? denies  Pacemaker/ICD? denies  Possible current pregnancy? no, male patient  Is the patient on methotrexate? denies  Current Complaints / other details:  63 year old male. Working full time. Strong family history of cancer.  Taking Lupron every six months. Using Estradial patches for hot flashes. Agreed to consider abiraterone if PSA reached 10 but reluctant otherwise.

## 2019-02-24 ENCOUNTER — Encounter: Payer: Self-pay | Admitting: Radiation Oncology

## 2019-02-24 ENCOUNTER — Other Ambulatory Visit: Payer: Self-pay

## 2019-02-24 ENCOUNTER — Ambulatory Visit
Admission: RE | Admit: 2019-02-24 | Discharge: 2019-02-24 | Disposition: A | Discharge status: Home / Self Care | Payer: BC Managed Care – PPO | Source: Ambulatory Visit | Attending: Radiation Oncology | Admitting: Radiation Oncology

## 2019-02-24 VITALS — Ht 66.0 in | Wt 189.0 lb

## 2019-02-24 DIAGNOSIS — C61 Malignant neoplasm of prostate: Secondary | ICD-10-CM

## 2019-02-24 HISTORY — DX: Malignant neoplasm of prostate: C61

## 2019-02-24 NOTE — Progress Notes (Signed)
Radiation Oncology         [phone] ________________________________  Initial outpatient Consultation - Conducted via MyChart due to current COVID-19 concerns for limiting patient exposure  Name: Scott [name] MRN: [id number]  Date: 02/24/2019  DOB: November 01, 1955  [id number], Doristine Johns, MD  [name], MD   REFERRING PHYSICIAN: [name], MD  DIAGNOSIS: 63 y.o. gentleman with metastatic castrate-resistant prostate cancer with Gleason Score of 4+5, and PSA of 159 at the time of diagnosis in 12/2016.    ICD-10-CM   1. Malignant neoplasm of prostate ([place])  C61   2. Prostate cancer metastatic to multiple sites [facility])  C61     HISTORY OF PRESENT ILLNESS: Scott [name] is a 63 y.o. male with a diagnosis of metastatic castrate resistant prostate cancer. He was initially diagnosed with advanced prostate cancer with associated pelvic lymphadenopathy in October 2018 with a PSA of 159.8 at that time.  He presented to the ED on 12/18/2016 with a two week history of urinary retention and hematuria.  A catheter was placed at that time and he was referred for evaluation in urology by Dr. Tresa Moore on 12/27/2016,  digital rectal examination was performed at that time revealing a "rock hard and fixed" prostate.  The patient proceeded to transrectal ultrasound with 12 biopsies of the prostate that same day.  The prostate volume measured 81 cc.  Out of 12 core biopsies, all 12 were positive.  The maximum Gleason score was 4+5, and this was seen in left mid lateral, left base, and left mid, all with perineural invasion. Gleason 4+4 was seen in the remaining 9 cores, with perineural invasion identified in all cores except right mid lateral and right mid. Extraprostatic extension was also seen in the left base lateral.  A CT A/P performed shortly after biopsy showed bilateral non-bulky iliac adenopathy. Bone scan performed on 01/01/2017 showed a single nonspecific focus of increased tracer in the left  orbit.  He proceeded to TURP on 01/25/2017 with final surgical pathology showing: prostatic adenocarcinoma, Gleason score 4+5, involving 75% of tissue; perineural invasion present. He was started on Lupron ADT at that time.  He initially had an excellent response with PSA decreased to 2.86 in February 2019 and nadired at 1.87 in December 2019.  He received his second dose of Lupron in 07/2017, but this was stopped due to intolerable hot flashes, sleep deprivation, and decreased mental clarity. Dr. Jeffie Pollock took over his care in 01/2018. He was started on estradiol patch in 04/2018 for symptom management. His PSA subsequently rose to 6 in 07/2018, and he was placed back on the Lupron with an initial response and decrease of the PSA to 4.37 in 08/2018 but again began rising shortly thereafter up to 4.64 in August 2020 and 6.46 in September 2020.   He underwent restaging scans on 11/26/2018 with CT C/A/P which confirmed no adenopathy or definite findings of active bony malignancy; nonspecific subtle sclerosis laterally in a right 6th rib. Bone scan was negative for skeletal metastasis.  He was referred to Dr. Delton Coombes on 11/28/2018, who ordered Axumin PET scan. This was performed on 12/11/2018 and showed small subcentimeter periaortic lymph nodes and left external ilial lymph nodes, with only low-grade activity and favoring reactive nodes but there was asymmetric activity in the right lobe of the prostate gland without evidence of distant metastatic disease or skeletal metastasis.  His PSA has continued to gradually rise despite resuming the Lupron ADT with his most recent PSA at 7.31 in 01/2019.  PSA trend: 11/06/18 - 4.64 11/28/18 - 6.46 02/10/19 - 7.31  He met back with Dr. Delton Coombes on 02/17/2019 to discuss further treatment recommendations regarding other systemic therapy options but the patient is reluctant to consider additional systemic therapy at this time given his negative experience with ADT.  However,  he reports today, that he would consider the systemic therapy if his PSA reaches 10.  The patient has kindly been referred today for discussion of potential radiation treatment options.   PREVIOUS RADIATION THERAPY: No  PAST MEDICAL HISTORY:  Past Medical History:  Diagnosis Date  . Essential hypertension, benign 01/26/2019  . HLD (hyperlipidemia) 01/26/2019  . Prostate cancer ([place])   . Vitamin D deficiency disease 01/26/2019      PAST SURGICAL HISTORY: Past Surgical History:  Procedure Laterality Date  . HERNIA REPAIR  6644   UMBILICAL   . PROSTATE SURGERY    . TONSILLECTOMY Bilateral   . TRANSURETHRAL RESECTION OF PROSTATE N/A 01/25/2017   Procedure: TRANSURETHRAL RESECTION OF THE PROSTATE (TURP);  Surgeon: Alexis Frock, MD;  Location: [facility];  Service: Urology;  Laterality: N/A;  . WISDOM TOOTH EXTRACTION      FAMILY HISTORY:  Family History  Problem Relation Age of Onset  . Emphysema Mother   . Alzheimer's disease Father   . Prostate cancer Father        dx in his 76s  . Prostate cancer Brother        dx in his 75s  . Breast cancer Maternal Grandmother   . Pancreatic cancer Maternal Grandfather   . Dementia Paternal Grandmother   . Heart attack Paternal Grandfather     SOCIAL HISTORY:  Social History   Socioeconomic History  . Marital status: Married    Spouse name: Not on file  . Number of children: 1  . Years of education: Not on file  . Highest education level: Not on file  Occupational History    Comment: working full time  Tobacco Use  . Smoking status: Former Smoker    Packs/day: 1.50    Years: 35.00    Pack years: 52.50    Types: Cigarettes    Quit date: 01/12/2004    Years since quitting: 15.1  . Smokeless tobacco: Never Used  . Tobacco comment: OVER 30 YEARS HX OF SMOKING   Substance and Sexual Activity  . Alcohol use: No  . Drug use: No  . Sexual activity: Not Currently  Other Topics Concern  . Not on file  Social History Narrative    Married for 19 years.Works for [employer] from home.   Social Determinants of Health   Financial Resource Strain: Low Risk   . Difficulty of Paying Living Expenses: Not hard at all  Food Insecurity: No Food Insecurity  . Worried About Charity fundraiser in the Last Year: Never true  . Ran Out of Food in the Last Year: Never true  Transportation Needs: No Transportation Needs  . Lack of Transportation (Medical): No  . Lack of Transportation (Non-Medical): No  Physical Activity: Inactive  . Days of Exercise per Week: 0 days  . Minutes of Exercise per Session: 0 min  Stress: No Stress Concern Present  . Feeling of Stress : Not at all  Social Connections: Slightly Isolated  . Frequency of Communication with Friends and Family: Twice a week  . Frequency of Social Gatherings with Friends and Family: Once a week  . Attends Religious Services: Never  . Active Member of  Clubs or Organizations: Yes  . Attends Archivist Meetings: Never  . Marital Status: Married  Human resources officer Violence: Not At Risk  . Fear of Current or Ex-Partner: No  . Emotionally Abused: No  . Physically Abused: No  . Sexually Abused: No    ALLERGIES: Wellbutrin [bupropion]  MEDICATIONS:  Current Outpatient Medications  Medication Sig Dispense Refill  . Barberry-Oreg Grape-Goldenseal (BERBERINE COMPLEX) 200-200-50 MG CAPS Take 2 capsules by mouth daily.    . Cholecalciferol (D 5000) 5000 units capsule Take 20,000 Units by mouth daily.     Marland Kitchen estradiol (VIVELLE-DOT) 0.1 MG/24HR patch APP 1 PA EXT  2 TIMES WEEKLY .    . Leuprolide Acetate, 6 Month, (LUPRON DEPOT, 70-MONTH,) 45 MG injection Inject 45 mg into the muscle every 6 (six) months.    . Melatonin 10 MG TABS Take 2 tablets by mouth every evening.    . Nattokinase 100 MG CAPS Take 1 capsule by mouth every evening.    Marland Kitchen OVER THE COUNTER MEDICATION Take 2 capsules by mouth daily. Liver Blend : Milk thistle and Dandelion root 475 mg    . OVER  THE COUNTER MEDICATION Take 3,000 mg by mouth daily. [place] Tail Mushroom  Anti-Cancer /Immune Scientist, product/process development    . OVER THE COUNTER MEDICATION daily. CBD oil daily    . Quercetin 250 MG TABS Take 500 mg by mouth 2 (two) times daily.    . TURMERIC CURCUMIN PO Take 750 mg by mouth daily.    . vitamin B-12 (CYANOCOBALAMIN) 1000 MCG tablet Take 1,000 mcg by mouth daily.     No current facility-administered medications for this encounter.    REVIEW OF SYSTEMS:  On review of systems, the patient reports that he is doing well overall. He denies any chest pain, shortness of breath, cough, fevers, chills, night sweats, unintended weight changes. He denies any bowel disturbances, and denies abdominal pain, nausea or vomiting. He denies any new musculoskeletal or joint aches or pains. His IPSS was 23, indicating severe urinary symptoms. He reports occasional leakage related to urinary urgency and intermittent post-void dribble His SHIM was 1, indicating he has severe erectile dysfunction. A complete review of systems is obtained and is otherwise negative.    PHYSICAL EXAM:  Wt Readings from Last 3 Encounters:  02/24/19 189 lb (85.7 kg)  01/26/19 189 lb (85.7 kg)  12/15/18 185 lb 9.6 oz (84.2 kg)   Temp Readings from Last 3 Encounters:  12/15/18 97.9 F (36.6 C) (Temporal)  11/28/18 (!) 97.1 F (36.2 C) (Temporal)  01/26/17 97.6 F (36.4 C) (Oral)   BP Readings from Last 3 Encounters:  01/26/19 (!) 170/90  12/15/18 140/84  11/28/18 (!) 161/88   Pulse Readings from Last 3 Encounters:  01/26/19 64  12/15/18 84  11/28/18 66   Pain Assessment Pain Score: 0-No pain/10  In general this is a well appearing Caucasian gentleman in no acute distress. He's alert and oriented x4 and appropriate throughout the examination. Cardiopulmonary assessment is negative for acute distress and he exhibits normal effort.    KPS = 90  100 - Normal; no complaints; no evidence of disease. 90   - Able to carry on  normal activity; minor signs or symptoms of disease. 80   - Normal activity with effort; some signs or symptoms of disease. 9   - Cares for self; unable to carry on normal activity or to do active work. 60   - Requires occasional assistance, but is able to  care for most of his personal needs. 50   - Requires considerable assistance and frequent medical care. 33   - Disabled; requires special care and assistance. 47   - Severely disabled; hospital admission is indicated although death not imminent. 29   - Very sick; hospital admission necessary; active supportive treatment necessary. 10   - Moribund; fatal processes progressing rapidly. 0     - Dead  Karnofsky DA, Abelmann [place], Craver LS and Burchenal [facility] [phone]) The use of the nitrogen mustards in the palliative treatment of carcinoma: with particular reference to bronchogenic carcinoma Cancer 1 634-56  LABORATORY DATA:  Lab Results  Component Value Date   WBC 6.4 02/10/2019   HGB 13.8 02/10/2019   HCT 40.1 02/10/2019   MCV 90.5 02/10/2019   PLT 252 02/10/2019   Lab Results  Component Value Date   NA 132 (L) 02/10/2019   K 4.3 02/10/2019   CL 101 02/10/2019   CO2 23 02/10/2019   Lab Results  Component Value Date   ALT 19 02/10/2019   AST 19 02/10/2019   ALKPHOS 68 02/10/2019   BILITOT 1.0 02/10/2019     RADIOGRAPHY: No results found.    IMPRESSION/PLAN: This visit was conducted via MyChart to spare the patient unnecessary potential exposure in the healthcare setting during the current COVID-19 pandemic. 1. 63 y.o. gentleman with metastatic castrate-resistant prostate cancer with Gleason Score of 4+5, and PSA of 159 at the time of diagnosis in 2018- current PSA is 7.31 despite ADT. We discussed the patient's workup and outlined the nature of prostate cancer in this setting. We discussed treatment options including adjuvant systemic therapy in addition to ADT, prostatectomy, and radiation therapy. Regarding the option of  radiotherapy, the recommendation in his case would be to proceed with a 4 week course of daily external beam radiation to include the prostate and pelvic nodes, delivered in 20 fractions. We discussed the available radiation techniques, and focused on the details and logistics of delivery. We discussed and outlined the risks, benefits, short and long-term effects associated with radiotherapy and compared and contrasted these with prostatectomy. We also discussed the role of SpaceOAR in reducing the rectal toxicity associated with radiotherapy.  He and his wife are encouraged to ask questions that were answered to their stated satisfaction.  At the end of the conversation, the patient remains undecided regarding his treatment preference and request some additional time to consider his options. He is advised to contact our office with any further questions or concerns, as well as to notify us of his decision should he elect to proceed with radiation therapy. We will share our discussion with Dr. Delton Coombes and Dr. Jeffie Pollock, and we look forward to following along in the care of this very nice gentleman.   Given current concerns for patient exposure during the COVID-19 pandemic, this encounter was conducted via video-enabled MyChart visit. The patient has given verbal consent for this type of encounter. The time spent during this encounter was 75 minutes. The attendants for this meeting include Tyler Pita MD, Ashlyn Bruning PA-C, [place], patient, Castiel Lauricella and his wife, Scott [name]. During the encounter, Tyler Pita MD, Ashlyn Bruning PA-C, and scribe, Wilburn Mylar were located at [place].  Patient, Scott [name] and wife, Scott [name] were located at home.    Nicholos Johns, PA-C    Tyler Pita, MD  [facility] Direct Dial: [phone]  Fax: [phone] [email or web address]  Skype  [email or web address]  This document  serves as a record of services personally performed by Tyler Pita, MD and Freeman Caldron, PA-C. It was created on their behalf by Wilburn Mylar, a trained medical scribe. The creation of this record is based on the scribe's personal observations and the provider's statements to them. This document has been checked and approved by the attending provider.

## 2019-03-02 ENCOUNTER — Telehealth: Payer: Self-pay | Admitting: Medical Oncology

## 2019-03-02 NOTE — Telephone Encounter (Signed)
Spoke with [name], to introduce myself as the prostate nurse navigator and discuss my role. I was unable to meet him 12/8, during his consult with Dr. Tammi Klippel. He states the consult went very well and he was impressed with Dr. Tammi Klippel. He states he is doing well but his PSA  is increasing. He is not sure, if he wants to move forward with radiation. He is receiving Lupron, and is tolerating,well. At first he had terrible hot flashes, lack of sleep and lacked mental clarity. He now uses the Vivelle patch and it works well. He is considering watching his PSA, and if it continues to increase, make his decision. He works and is concerned about having to go for daily treatments. He lives in [place] and I informed  Him, he can receive radiation at [facility], if this would be closer to home. He has a strong family history of prostate cancer, including a brother and father. I asked if he has considered genetic studies. He was scheduled but had to cancel.If he would like to reschedule, I will be happy to get him referred. . I gave him my contact information and asked him to call me with questions or concerns. He voiced understanding.

## 2019-03-19 ENCOUNTER — Other Ambulatory Visit: Payer: Self-pay

## 2019-03-19 ENCOUNTER — Other Ambulatory Visit (HOSPITAL_COMMUNITY)
Admission: RE | Admit: 2019-03-19 | Discharge: 2019-03-19 | Disposition: A | Discharge status: Home / Self Care | Payer: BC Managed Care – PPO | Source: Ambulatory Visit | Attending: Urology | Admitting: Urology

## 2019-03-19 DIAGNOSIS — R9721 Rising PSA following treatment for malignant neoplasm of prostate: Secondary | ICD-10-CM | POA: Insufficient documentation

## 2019-03-19 LAB — PSA: Prostatic Specific Antigen: 8.2 ng/mL — ABNORMAL HIGH (ref 0.00–4.00)

## 2019-03-27 ENCOUNTER — Ambulatory Visit: Payer: BC Managed Care – PPO | Admitting: Urology

## 2019-04-10 ENCOUNTER — Ambulatory Visit (INDEPENDENT_AMBULATORY_CARE_PROVIDER_SITE_OTHER): Payer: BC Managed Care – PPO | Admitting: Urology

## 2019-04-10 ENCOUNTER — Other Ambulatory Visit: Payer: Self-pay

## 2019-04-10 VITALS — BP 164/80 | HR 98 | Temp 96.4°F | Ht 66.0 in | Wt 190.0 lb

## 2019-04-10 DIAGNOSIS — R232 Flushing: Secondary | ICD-10-CM | POA: Diagnosis not present

## 2019-04-10 DIAGNOSIS — C61 Malignant neoplasm of prostate: Secondary | ICD-10-CM | POA: Diagnosis not present

## 2019-04-10 DIAGNOSIS — R9721 Rising PSA following treatment for malignant neoplasm of prostate: Secondary | ICD-10-CM

## 2019-04-10 DIAGNOSIS — T50905A Adverse effect of unspecified drugs, medicaments and biological substances, initial encounter: Secondary | ICD-10-CM

## 2019-04-10 MED ORDER — ESTRADIOL 0.1 MG/24HR TD PTTW: 1.0000 | MEDICATED_PATCH | TRANSDERMAL | 11 refills | Status: DC

## 2019-04-10 MED ORDER — LEUPROLIDE ACETATE (6 MONTH) 45 MG IM KIT
45.0000 mg | PACK | Freq: Once | INTRAMUSCULAR | Status: AC
  Administered 2019-04-10: 45 mg via INTRAMUSCULAR

## 2019-04-10 MED ORDER — DUTASTERIDE 0.5 MG PO CAPS: 0.5000 mg | ORAL_CAPSULE | Freq: Every day | ORAL | 11 refills | Status: DC

## 2019-04-10 NOTE — Progress Notes (Signed)
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 CT with bilateral non-bulky iliac adenopathy and tiny uptake Lt orbit (no spine mets) by bone scan. He has no bone pain but he has some chronic low back pain. He has had a recent PSA of 6 with a testosterone of 54. Lupron was resumed and the PSA fell to 4.37 but it is now up to 8.2 with a T of <3.  He remains on estradiol 0.1mg  patches for the hot flashes.   He had a telehealth visit with Dr. Tammi Klippel for consideration of radiation therapy to the primary and pelvic nodes from the [place] PET findings on 12/11/18.   He is reluctant to consider Zytiga because of the prednisone.   He has had no recent hematuria.  He has frequency with urgency and UUI.  He has a reduced stream.  He has nocturia 3-5x but he is drinking a lot of fluids.    Present Management:  01/2017 begin Lupron androgen deprivation Q44mo (declined reccomended orchiectomy)  04/2017 - PSA 2.86;  07/2017 PSA/T 3.44 / T 3 ==> Lupron 45mg .  10/08/17 PSA 2.1.  02/07/18 PSA 2.19/T 4. added Estradiol 0.1mg  patches twice weekly for the hot flashes.  03/10/18 PSA 1.87/T<3.  05/09/18 PSA 2.09/T10/ Est 32.  07/24/18 PSA 6/T 54  08/08/18 Lupron 45mg .  09/08/18 PSA 4.37  10/22/18 PSA 4.64/T<3.  11/26/18 Bone scan negative/ CT C/A/P negative. 12/11/18 Axumin PET small equivocal periaortic and left ext iliac nodes and right prostate activity.  I have discussed that with him.  11/28/18 PSA 6.46. 02/10/19 PSA 7.31/T <3. 03/19/19 PSA 8.20     Operative cysto / TURP 2018 with inviltrative bladder neck / prostate mass ( left UO purposefully resected).   4 - Hot Flashes - have recurred since starting resuming the Lupron after largely resolving with the change to Estradiol patches.   He would like to see about reducing the estradiol and he also has questions about Avodart  and whether reducing DHT would help his symptoms.       IPSS    Row Name 04/10/19 1400         International Prostate Symptom Score   How often have you had the sensation of not emptying your bladder?  Not at All     How often have you had to urinate less than every two hours?  More than half the time     How often have you found you stopped and started again several times when you urinated?  More than half the time     How often have you found it difficult to postpone urination?  Almost always     How often have you had a weak urinary stream?  Almost always     How often have you had to strain to start urination?  Not at All     How many times did you typically get up at night to urinate?  4 Times     Total IPSS Score  22       Quality of Life due to urinary symptoms   If you were to spend the rest of your life with your urinary condition just the way it is now how would you feel about that?  Mixed           ROS:  ROS:  A complete review of systems was performed.  All systems are negative except for pertinent findings as noted.   Review of Systems  Genitourinary: Positive for frequency and urgency.  All other systems reviewed and are negative.   Allergies  Allergen Reactions  . Wellbutrin [Bupropion]     Hives     Outpatient Encounter Medications as of 04/10/2019  Medication Sig  . Barberry-Oreg Grape-Goldenseal (BERBERINE COMPLEX) 200-200-50 MG CAPS Take 2 capsules by mouth daily.  . Cholecalciferol (D 5000) 5000 units capsule Take 20,000 Units by mouth daily.   Derrill Memo ON 04/13/2019] estradiol (VIVELLE-DOT) 0.1 MG/24HR patch Place 1 patch (0.1 mg total) onto the skin 2 (two) times a week. For management of hot flashes for ADT.  Marland Kitchen Leuprolide Acetate, 6 Month, (LUPRON DEPOT, 41-MONTH,) 45 MG injection Inject 45 mg into the muscle every 6 (six) months.  . Melatonin 10 MG TABS Take 2 tablets by mouth every evening.  . Nattokinase 100 MG CAPS Take 1 capsule by mouth  every evening.  Marland Kitchen OVER THE COUNTER MEDICATION Take 2 capsules by mouth daily. Liver Blend : Milk thistle and Dandelion root 475 mg  . OVER THE COUNTER MEDICATION Take 3,000 mg by mouth daily. [place] Tail Mushroom  Anti-Cancer /Immune Scientist, product/process development  . OVER THE COUNTER MEDICATION daily. CBD oil daily  . Quercetin 250 MG TABS Take 500 mg by mouth 2 (two) times daily.  . TURMERIC CURCUMIN PO Take 750 mg by mouth daily.  . vitamin B-12 (CYANOCOBALAMIN) 1000 MCG tablet Take 1,000 mcg by mouth daily.  . [DISCONTINUED] estradiol (VIVELLE-DOT) 0.1 MG/24HR patch APP 1 PA EXT  2 TIMES WEEKLY .  . dutasteride (AVODART) 0.5 MG capsule Take 1 capsule (0.5 mg total) by mouth daily.  . [EXPIRED] Leuprolide Acetate (6 Month) (LUPRON) injection 45 mg    No facility-administered encounter medications on file as of 04/10/2019.    Past Medical History:  Diagnosis Date  . Essential hypertension, benign 01/26/2019  . HLD (hyperlipidemia) 01/26/2019  . Prostate cancer ([place])   . Vitamin D deficiency disease 01/26/2019    Past Surgical History:  Procedure Laterality Date  . HERNIA REPAIR  [id number]   UMBILICAL   . PROSTATE SURGERY    . TONSILLECTOMY Bilateral   . TRANSURETHRAL RESECTION OF PROSTATE N/A 01/25/2017   Procedure: TRANSURETHRAL RESECTION OF THE PROSTATE (TURP);  Surgeon: Alexis Frock, MD;  Location: [facility];  Service: Urology;  Laterality: N/A;  . WISDOM TOOTH EXTRACTION      Social History   Socioeconomic History  . Marital status: Married    Spouse name: Not on file  . Number of children: 1  . Years of education: Not on file  . Highest education level: Not on file  Occupational History    Comment: working full time  Tobacco Use  . Smoking status: Former Smoker    Packs/day: 1.50    Years: 35.00    Pack years: 52.50    Types: Cigarettes    Quit date: 01/12/2004    Years since quitting: 15.2  . Smokeless tobacco: Never Used  . Tobacco comment: OVER 30 YEARS HX OF SMOKING   Substance and  Sexual Activity  . Alcohol use: No  . Drug use: No  . Sexual activity: Not Currently  Other Topics Concern  . Not on file  Social History Narrative   Married for 19 years.Works for [employer] from home.   Social Determinants of Health   Financial Resource Strain: Low  Risk   . Difficulty of Paying Living Expenses: Not hard at all  Food Insecurity: No Food Insecurity  . Worried About Charity fundraiser in the Last Year: Never true  . Ran Out of Food in the Last Year: Never true  Transportation Needs: No Transportation Needs  . Lack of Transportation (Medical): No  . Lack of Transportation (Non-Medical): No  Physical Activity: Inactive  . Days of Exercise per Week: 0 days  . Minutes of Exercise per Session: 0 min  Stress: No Stress Concern Present  . Feeling of Stress : Not at all  Social Connections: Slightly Isolated  . Frequency of Communication with Friends and Family: Twice a week  . Frequency of Social Gatherings with Friends and Family: Once a week  . Attends Religious Services: Never  . Active Member of Clubs or Organizations: Yes  . Attends Archivist Meetings: Never  . Marital Status: Married  Human resources officer Violence: Not At Risk  . Fear of Current or Ex-Partner: No  . Emotionally Abused: No  . Physically Abused: No  . Sexually Abused: No    Family History  Problem Relation Age of Onset  . Emphysema Mother   . Alzheimer's disease Father   . Prostate cancer Father        dx in his 24s  . Prostate cancer Brother        dx in his 31s  . Breast cancer Maternal Grandmother   . Pancreatic cancer Maternal Grandfather   . Dementia Paternal Grandmother   . Heart attack Paternal Grandfather        Objective: Vitals:   04/10/19 1410  BP: (!) 164/80  Pulse: 98  Temp: (!) 96.4 F (35.8 C)     Physical Exam Vitals reviewed.  Constitutional:      Appearance: Normal appearance.  Neurological:     Mental Status: He is alert.   I have  reviewed records from Dr. Delton Coombes.       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  He is seeing Dr. Delton Coombes but has been reluctant to consider Zytiga and prednisone because of the possible immunosuppressant effect during the Covid pandemic.   He has not been able to get a vaccine yet.   I discussed consideration of an androgen receptor blocker such as Xtandi, but he has been doing some research and would like to add dutasteride.  I explained that was sometimes used before we had the newer agents but he would like to give it a try and I see no harm in doing so.  I sent a script.   He will see me in 3 months with labs.   We also discuss the local radiation therapy proposed by Dr. Tammi Klippel but he is once again concerned about the immune effects.   I think it would be reasonable to pursue local therapy based on the PET findings.   Hot flashes.   He would like to reduce the dose of the Estradiol and we discussed options.   He will change to 1 patch about every 5 days instead of 2 weekly, but I left the script the same in case he needs to go back to the prior dose.    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   He is content with his voiding symptoms at this time.   Meds ordered this encounter  Medications  . estradiol (VIVELLE-DOT) 0.1 MG/24HR patch  Sig: Place 1 patch (0.1 mg total) onto the skin 2 (two) times a week. For management of hot flashes for ADT.    Dispense:  8 patch    Refill:  11  . dutasteride (AVODART) 0.5 MG capsule    Sig: Take 1 capsule (0.5 mg total) by mouth daily.    Dispense:  30 capsule    Refill:  11  . Leuprolide Acetate (6 Month) (LUPRON) injection 45 mg     Orders Placed This Encounter  Procedures  . Estradiol    Standing Status:   Future    Standing Expiration Date:   08/08/2019  . PSA    Standing Status:   Future    Standing Expiration Date:   08/08/2019  . Testosterone     Standing Status:   Future    Standing Expiration Date:   08/08/2019      Return in about 3 months (around 07/09/2019) for for prostate cancer f/u. Marland Kitchen   CC: Doree Albee, MD, Dr. [name].     Irine Seal 04/10/2019

## 2019-04-23 ENCOUNTER — Other Ambulatory Visit: Payer: Self-pay

## 2019-04-23 ENCOUNTER — Inpatient Hospital Stay (HOSPITAL_COMMUNITY): Payer: BC Managed Care – PPO | Attending: Hematology

## 2019-04-23 DIAGNOSIS — R0602 Shortness of breath: Secondary | ICD-10-CM | POA: Diagnosis not present

## 2019-04-23 DIAGNOSIS — R232 Flushing: Secondary | ICD-10-CM | POA: Insufficient documentation

## 2019-04-23 DIAGNOSIS — R911 Solitary pulmonary nodule: Secondary | ICD-10-CM | POA: Insufficient documentation

## 2019-04-23 DIAGNOSIS — E559 Vitamin D deficiency, unspecified: Secondary | ICD-10-CM | POA: Insufficient documentation

## 2019-04-23 DIAGNOSIS — Z87891 Personal history of nicotine dependence: Secondary | ICD-10-CM | POA: Diagnosis not present

## 2019-04-23 DIAGNOSIS — I10 Essential (primary) hypertension: Secondary | ICD-10-CM | POA: Insufficient documentation

## 2019-04-23 DIAGNOSIS — R590 Localized enlarged lymph nodes: Secondary | ICD-10-CM | POA: Diagnosis not present

## 2019-04-23 DIAGNOSIS — R5383 Other fatigue: Secondary | ICD-10-CM | POA: Insufficient documentation

## 2019-04-23 DIAGNOSIS — Z803 Family history of malignant neoplasm of breast: Secondary | ICD-10-CM | POA: Diagnosis not present

## 2019-04-23 DIAGNOSIS — C61 Malignant neoplasm of prostate: Secondary | ICD-10-CM | POA: Diagnosis present

## 2019-04-23 DIAGNOSIS — E785 Hyperlipidemia, unspecified: Secondary | ICD-10-CM | POA: Diagnosis not present

## 2019-04-23 LAB — COMPREHENSIVE METABOLIC PANEL
ALT: 17 U/L (ref 0–44)
AST: 17 U/L (ref 15–41)
Albumin: 4.3 g/dL (ref 3.5–5.0)
Alkaline Phosphatase: 62 U/L (ref 38–126)
Anion gap: 9 (ref 5–15)
BUN: 13 mg/dL (ref 8–23)
CO2: 25 mmol/L (ref 22–32)
Calcium: 8.9 mg/dL (ref 8.9–10.3)
Chloride: 102 mmol/L (ref 98–111)
Creatinine, Ser: 0.59 mg/dL — ABNORMAL LOW (ref 0.61–1.24)
GFR calc Af Amer: >60 mL/min (ref >60)
GFR calc non Af Amer: >60 mL/min (ref >60)
Glucose, Bld: 97 mg/dL (ref 70–99)
Potassium: 4.4 mmol/L (ref 3.5–5.1)
Sodium: 136 mmol/L (ref 135–145)
Total Bilirubin: 0.9 mg/dL (ref 0.3–1.2)
Total Protein: 6.7 g/dL (ref 6.5–8.1)

## 2019-04-23 LAB — PSA: Prostatic Specific Antigen: 9.96 ng/mL — ABNORMAL HIGH (ref 0.00–4.00)

## 2019-04-23 LAB — CBC WITH DIFFERENTIAL/PLATELET
Abs Immature Granulocytes: 0.02 10*3/uL (ref 0.00–0.07)
Basophils Absolute: 0.1 10*3/uL (ref 0.0–0.1)
Basophils Relative: 2 %
Eosinophils Absolute: 0.4 10*3/uL (ref 0.0–0.5)
Eosinophils Relative: 6 %
HCT: 39.3 % (ref 39.0–52.0)
Hemoglobin: 13.5 g/dL (ref 13.0–17.0)
Immature Granulocytes: 0 %
Lymphocytes Relative: 29 %
Lymphs Abs: 2 10*3/uL (ref 0.7–4.0)
MCH: 31.6 pg (ref 26.0–34.0)
MCHC: 34.4 g/dL (ref 30.0–36.0)
MCV: 92 fL (ref 80.0–100.0)
Monocytes Absolute: 0.7 10*3/uL (ref 0.1–1.0)
Monocytes Relative: 9 %
Neutro Abs: 3.8 10*3/uL (ref 1.7–7.7)
Neutrophils Relative %: 54 %
Platelets: 285 10*3/uL (ref 150–400)
RBC: 4.27 MIL/uL (ref 4.22–5.81)
RDW: 12.4 % (ref 11.5–15.5)
WBC: 7.1 10*3/uL (ref 4.0–10.5)
nRBC: 0 % (ref 0.0–0.2)

## 2019-04-24 LAB — TESTOSTERONE: Testosterone: 6 ng/dL — ABNORMAL LOW (ref 264–916)

## 2019-04-28 ENCOUNTER — Encounter (HOSPITAL_COMMUNITY): Payer: Self-pay | Admitting: Hematology

## 2019-04-28 ENCOUNTER — Other Ambulatory Visit: Payer: Self-pay

## 2019-04-28 ENCOUNTER — Inpatient Hospital Stay (HOSPITAL_COMMUNITY): Payer: BC Managed Care – PPO | Admitting: Hematology

## 2019-04-28 VITALS — BP 169/86 | HR 80 | Temp 96.9°F | Resp 18 | Wt 194.0 lb

## 2019-04-28 DIAGNOSIS — C61 Malignant neoplasm of prostate: Secondary | ICD-10-CM | POA: Diagnosis not present

## 2019-04-28 NOTE — Progress Notes (Signed)
[place], [place] [address]   CLINIC:  Medical Oncology/Hematology  PCP:  Doree Albee, MD [place] [phone]   REASON FOR VISIT:  Follow-up for prostate cancer.  CURRENT THERAPY: Lupron every 6 months.    INTERVAL HISTORY:  Scott [name] 64 y.o. male seen for follow-up of castration refractory prostate cancer.  Reports tiredness and hot flashes from Lupron injection.  Appetite and energy levels are 100%.  Fatigue has been stable.  Shortness of breath on exertion is also stable.    REVIEW OF SYSTEMS:  Review of Systems  Constitutional: Positive for fatigue.  Endocrine: Positive for hot flashes.  All other systems reviewed and are negative.    PAST MEDICAL/SURGICAL HISTORY:  Past Medical History:  Diagnosis Date  . Essential hypertension, benign 01/26/2019  . HLD (hyperlipidemia) 01/26/2019  . Prostate cancer ([place])   . Vitamin D deficiency disease 01/26/2019   Past Surgical History:  Procedure Laterality Date  . HERNIA REPAIR  [id number]   UMBILICAL   . PROSTATE SURGERY    . TONSILLECTOMY Bilateral   . TRANSURETHRAL RESECTION OF PROSTATE N/A 01/25/2017   Procedure: TRANSURETHRAL RESECTION OF THE PROSTATE (TURP);  Surgeon: Alexis Frock, MD;  Location: [facility];  Service: Urology;  Laterality: N/A;  . WISDOM TOOTH EXTRACTION       SOCIAL HISTORY:  Social History   Socioeconomic History  . Marital status: Married    Spouse name: Not on file  . Number of children: 1  . Years of education: Not on file  . Highest education level: Not on file  Occupational History    Comment: working full time  Tobacco Use  . Smoking status: Former Smoker    Packs/day: 1.50    Years: 35.00    Pack years: 52.50    Types: Cigarettes    Quit date: 01/12/2004    Years since quitting: 15.3  . Smokeless tobacco: Never Used  . Tobacco comment: OVER 30 YEARS HX OF SMOKING   Substance and Sexual Activity  . Alcohol  use: No  . Drug use: No  . Sexual activity: Not Currently  Other Topics Concern  . Not on file  Social History Narrative   Married for 19 years.Works for [employer] from home.   Social Determinants of Health   Financial Resource Strain: Low Risk   . Difficulty of Paying Living Expenses: Not hard at all  Food Insecurity: No Food Insecurity  . Worried About Charity fundraiser in the Last Year: Never true  . Ran Out of Food in the Last Year: Never true  Transportation Needs: No Transportation Needs  . Lack of Transportation (Medical): No  . Lack of Transportation (Non-Medical): No  Physical Activity: Inactive  . Days of Exercise per Week: 0 days  . Minutes of Exercise per Session: 0 min  Stress: No Stress Concern Present  . Feeling of Stress : Not at all  Social Connections: Slightly Isolated  . Frequency of Communication with Friends and Family: Twice a week  . Frequency of Social Gatherings with Friends and Family: Once a week  . Attends Religious Services: Never  . Active Member of Clubs or Organizations: Yes  . Attends Archivist Meetings: Never  . Marital Status: Married  Human resources officer Violence: Not At Risk  . Fear of Current or Ex-Partner: No  . Emotionally Abused: No  . Physically Abused: No  . Sexually Abused: No  FAMILY HISTORY:  Family History  Problem Relation Age of Onset  . Emphysema Mother   . Alzheimer's disease Father   . Prostate cancer Father        dx in his 6s  . Prostate cancer Brother        dx in his 30s  . Breast cancer Maternal Grandmother   . Pancreatic cancer Maternal Grandfather   . Dementia Paternal Grandmother   . Heart attack Paternal Grandfather     CURRENT MEDICATIONS:  Outpatient Encounter Medications as of 04/28/2019  Medication Sig  . Barberry-Oreg Grape-Goldenseal (BERBERINE COMPLEX) 200-200-50 MG CAPS Take 2 capsules by mouth daily.  . Cholecalciferol (D 5000) 5000 units capsule Take 20,000 Units by mouth  daily.   Marland Kitchen dutasteride (AVODART) 0.5 MG capsule Take 1 capsule (0.5 mg total) by mouth daily.  Marland Kitchen estradiol (VIVELLE-DOT) 0.1 MG/24HR patch Place 1 patch (0.1 mg total) onto the skin 2 (two) times a week. For management of hot flashes for ADT.  Marland Kitchen Leuprolide Acetate, 6 Month, (LUPRON DEPOT, 54-MONTH,) 45 MG injection Inject 45 mg into the muscle every 6 (six) months.  . Melatonin 10 MG TABS Take 2 tablets by mouth every evening.  . Nattokinase 100 MG CAPS Take 1 capsule by mouth every evening.  Marland Kitchen OVER THE COUNTER MEDICATION Take 2 capsules by mouth daily. Liver Blend : Milk thistle and Dandelion root 475 mg  . OVER THE COUNTER MEDICATION Take 3,000 mg by mouth daily. [place] Tail Mushroom  Anti-Cancer /Immune Scientist, product/process development  . OVER THE COUNTER MEDICATION daily. CBD oil daily  . Quercetin 250 MG TABS Take 500 mg by mouth 2 (two) times daily.  . TURMERIC CURCUMIN PO Take 750 mg by mouth daily.  . vitamin B-12 (CYANOCOBALAMIN) 1000 MCG tablet Take 1,000 mcg by mouth daily.   No facility-administered encounter medications on file as of 04/28/2019.    ALLERGIES:  Allergies  Allergen Reactions  . Wellbutrin [Bupropion]     Hives      PHYSICAL EXAM:  ECOG Performance status: 0  Vitals:   04/28/19 1430  BP: (!) 169/86  Pulse: 80  Resp: 18  Temp: (!) 96.9 F (36.1 C)  SpO2: 97%   Filed Weights   04/28/19 1430  Weight: 194 lb (88 kg)    Physical Exam Vitals reviewed.  Constitutional:      Appearance: Normal appearance.  Cardiovascular:     Rate and Rhythm: Normal rate and regular rhythm.     Heart sounds: Normal heart sounds.  Pulmonary:     Effort: Pulmonary effort is normal.     Breath sounds: Normal breath sounds.  Abdominal:     General: There is no distension.     Palpations: Abdomen is soft. There is no mass.  Musculoskeletal:        General: No swelling.  Skin:    General: Skin is warm.  Neurological:     General: No focal deficit present.     Mental Status: He is alert  and oriented to person, place, and time.  Psychiatric:        Mood and Affect: Mood normal.        Behavior: Behavior normal.      LABORATORY DATA:  I have reviewed the labs as listed.  CBC    Component Value Date/Time   WBC 7.1 04/23/2019 1358   RBC 4.27 04/23/2019 1358   HGB 13.5 04/23/2019 1358   HCT 39.3 04/23/2019 1358   PLT 285 04/23/2019 1358  MCV 92.0 04/23/2019 1358   MCH 31.6 04/23/2019 1358   MCHC 34.4 04/23/2019 1358   RDW 12.4 04/23/2019 1358   LYMPHSABS 2.0 04/23/2019 1358   MONOABS 0.7 04/23/2019 1358   EOSABS 0.4 04/23/2019 1358   BASOSABS 0.1 04/23/2019 1358   CMP Latest Ref Rng & Units 04/23/2019 02/10/2019 04/23/2017  Glucose 70 - 99 mg/dL 97 105(H) 132(H)  BUN 8 - 23 mg/dL 13 10 14   Creatinine 0.61 - 1.24 mg/dL 0.59(L) 0.62 0.71  Sodium 135 - 145 mmol/L 136 132(L) 136  Potassium 3.5 - 5.1 mmol/L 4.4 4.3 4.1  Chloride 98 - 111 mmol/L 102 101 102  CO2 22 - 32 mmol/L 25 23 25   Calcium 8.9 - 10.3 mg/dL 8.9 8.9 9.5  Total Protein 6.5 - 8.1 g/dL 6.7 6.9 6.5  Total Bilirubin 0.3 - 1.2 mg/dL 0.9 1.0 1.0  Alkaline Phos 38 - 126 U/L 62 68 67  AST 15 - 41 U/L 17 19 21   ALT 0 - 44 U/L 17 19 19        DIAGNOSTIC IMAGING:  I have independently reviewed the scans and discussed with the patient.    ASSESSMENT & PLAN:   Prostate cancer metastatic to multiple sites ([place]) 1.  Metastatic castration refractory prostate cancer: - Diagnosed in November 2018 with bulky pelvic adenopathy.  No record of bone metastasis.  PSA 159. - TURP on 01/26/2017 consistent with prostatic adenocarcinoma, Gleason 4+5= 9. - Started on Lupron in November 2018, stopped after last dose in May 2019 until May 2020.  Lupron was held secondary to severe hot flashes, sleep deprivation and decreased mental clarity. -He was started back on Lupron in May 2020 for elevated PSA of 6 (testosterone 54).  Last PSA from 11/06/2018 was 4.64, up from 4.37 on 09/08/2018 (testosterone was less than 3 both  times). -He was started on estradiol 0.1 mg patches approximately in February 2020 which has helped with his hot flashes and other symptoms. -CT CAP on 11/26/2018 shows right paratracheal and right hilar and left infrahilar lymph nodes measuring 0.8 cm.  0.5 cm right middle lobe lung nodule stable.  No adenopathy or definitive evidence of bone mets. -Bone scan on 11/26/2018 did not show any evidence of skeletal metastasis.  Bone scan on 01/01/2017 showed single focus of nonspecific increased tracer localization at the superolateral aspect of the left orbit, uncertain etiology. - Axumin scan on 12/11/2018 showed small subcentimeter periaortic lymph nodes and left external iliac lymph nodes with radiotracer activity less than or equal to marrow activity.  Asymmetric activity in the right lobe of the prostate gland could represent residual carcinoma.  No evidence of distant metastatic disease or skeletal metastasis. -We had discussion about Zytiga and prednisone at last visit.  He was reluctant to consider it at this time. -He was evaluated by Dr. Tammi Klippel and offered radiation.  Patient reported that he is not interested at this time. -He was evaluated by Dr. Jeffie Pollock on 04/10/2019 and Lupron 45 mg was given.  He was also started on Avodart 0.5 mg. -He would like to continue Avodart at this time.  We reviewed results from 04/23/2019.  PSA has gone up to 9.96.  Testosterone is 6.  CBC was within normal limits.  Creatinine was normal.  Calcium was 8.9. -I plan to repeat PSA in 2 months.   2.  Family history: - Brother had advanced prostate cancer in his 44s.  Father had prostate cancer in his 57s.  Maternal grandmother had breast  cancer.  Maternal grandfather had pancreatic cancer. -We will consider germline mutation testing.  3.  Smoking history: - He smoked 1-1/2 pack/day for 35 years and quit in 2005. -CT chest on 11/26/2018 showed stable 0.5 cm right middle lobe lung nodule.  4.  Hot flashes: -He has hot  flashes from Lupron injection. -He is using estradiol patch every 3 to 5 days.   Orders placed this encounter:  Orders Placed This Encounter  Procedures  . CBC with Differential/Platelet  . Comprehensive metabolic panel  . PSA      [name], MD [place] [phone]

## 2019-04-28 NOTE — Assessment & Plan Note (Signed)
1.  Metastatic castration refractory prostate cancer: - Diagnosed in November 2018 with bulky pelvic adenopathy.  No record of bone metastasis.  PSA 159. - TURP on 01/26/2017 consistent with prostatic adenocarcinoma, Gleason 4+5= 9. - Started on Lupron in November 2018, stopped after last dose in May 2019 until May 2020.  Lupron was held secondary to severe hot flashes, sleep deprivation and decreased mental clarity. -He was started back on Lupron in May 2020 for elevated PSA of 6 (testosterone 54).  Last PSA from 11/06/2018 was 4.64, up from 4.37 on 09/08/2018 (testosterone was less than 3 both times). -He was started on estradiol 0.1 mg patches approximately in February 2020 which has helped with his hot flashes and other symptoms. -CT CAP on 11/26/2018 shows right paratracheal and right hilar and left infrahilar lymph nodes measuring 0.8 cm.  0.5 cm right middle lobe lung nodule stable.  No adenopathy or definitive evidence of bone mets. -Bone scan on 11/26/2018 did not show any evidence of skeletal metastasis.  Bone scan on 01/01/2017 showed single focus of nonspecific increased tracer localization at the superolateral aspect of the left orbit, uncertain etiology. - Axumin scan on 12/11/2018 showed small subcentimeter periaortic lymph nodes and left external iliac lymph nodes with radiotracer activity less than or equal to marrow activity.  Asymmetric activity in the right lobe of the prostate gland could represent residual carcinoma.  No evidence of distant metastatic disease or skeletal metastasis. -We had discussion about Zytiga and prednisone at last visit.  He was reluctant to consider it at this time. -He was evaluated by Dr. Tammi Klippel and offered radiation.  Patient reported that he is not interested at this time. -He was evaluated by Dr. Jeffie Pollock on 04/10/2019 and Lupron 45 mg was given.  He was also started on Avodart 0.5 mg. -He would like to continue Avodart at this time.  We reviewed results from  04/23/2019.  PSA has gone up to 9.96.  Testosterone is 6.  CBC was within normal limits.  Creatinine was normal.  Calcium was 8.9. -I plan to repeat PSA in 2 months.   2.  Family history: - Brother had advanced prostate cancer in his 32s.  Father had prostate cancer in his 36s.  Maternal grandmother had breast cancer.  Maternal grandfather had pancreatic cancer. -We will consider germline mutation testing.  3.  Smoking history: - He smoked 1-1/2 pack/day for 35 years and quit in 2005. -CT chest on 11/26/2018 showed stable 0.5 cm right middle lobe lung nodule.  4.  Hot flashes: -He has hot flashes from Lupron injection. -He is using estradiol patch every 3 to 5 days.

## 2019-04-28 NOTE — Patient Instructions (Addendum)
[facility] at [facility] Discharge Instructions  You were seen today by Dr. Katragadda. He went over your recent lab results. He will see you back in 2 months for labs and follow up.   Thank you for choosing [facility] at [facility] to provide your oncology and hematology care.  To afford each patient quality time with our provider, please arrive at least 15 minutes before your scheduled appointment time.   If you have a lab appointment with the Cancer Center please come in thru the  Main Entrance and check in at the main information desk  You need to re-schedule your appointment should you arrive 10 or more minutes late.  We strive to give you quality time with our providers, and arriving late affects you and other patients whose appointments are after yours.  Also, if you no show three or more times for appointments you may be dismissed from the clinic at the providers discretion.     Again, thank you for choosing [facility].  Our hope is that these requests will decrease the amount of time that you wait before being seen by our physicians.       _____________________________________________________________  Should you have questions after your visit to [facility], please contact our office at [phone] between the hours of 8:00 a.m. and 4:30 p.m.  Voicemails left after 4:00 p.m. will not be returned until the following business day.  For prescription refill requests, have your pharmacy contact our office and allow 72 hours.    Cancer Center Support Programs:   > Cancer Support Group  2nd Tuesday of the month 1pm-2pm, Journey Room    

## 2019-04-29 ENCOUNTER — Encounter (HOSPITAL_COMMUNITY): Payer: Self-pay

## 2019-06-23 ENCOUNTER — Other Ambulatory Visit: Payer: Self-pay

## 2019-06-23 ENCOUNTER — Inpatient Hospital Stay (HOSPITAL_COMMUNITY): Payer: BC Managed Care – PPO | Attending: Hematology

## 2019-06-23 DIAGNOSIS — C61 Malignant neoplasm of prostate: Secondary | ICD-10-CM | POA: Diagnosis present

## 2019-06-23 LAB — COMPREHENSIVE METABOLIC PANEL
ALT: 14 U/L (ref 0–44)
AST: 15 U/L (ref 15–41)
Albumin: 4.3 g/dL (ref 3.5–5.0)
Alkaline Phosphatase: 54 U/L (ref 38–126)
Anion gap: 8 (ref 5–15)
BUN: 9 mg/dL (ref 8–23)
CO2: 23 mmol/L (ref 22–32)
Calcium: 9 mg/dL (ref 8.9–10.3)
Chloride: 103 mmol/L (ref 98–111)
Creatinine, Ser: 0.71 mg/dL (ref 0.61–1.24)
GFR calc Af Amer: >60 mL/min (ref >60)
GFR calc non Af Amer: >60 mL/min (ref >60)
Glucose, Bld: 109 mg/dL — ABNORMAL HIGH (ref 70–99)
Potassium: 4.1 mmol/L (ref 3.5–5.1)
Sodium: 134 mmol/L — ABNORMAL LOW (ref 135–145)
Total Bilirubin: 0.7 mg/dL (ref 0.3–1.2)
Total Protein: 6.8 g/dL (ref 6.5–8.1)

## 2019-06-23 LAB — CBC WITH DIFFERENTIAL/PLATELET
Abs Immature Granulocytes: 0 10*3/uL (ref 0.00–0.07)
Basophils Absolute: 0.1 10*3/uL (ref 0.0–0.1)
Basophils Relative: 2 %
Eosinophils Absolute: 0.1 10*3/uL (ref 0.0–0.5)
Eosinophils Relative: 3 %
HCT: 38.6 % — ABNORMAL LOW (ref 39.0–52.0)
Hemoglobin: 13.4 g/dL (ref 13.0–17.0)
Immature Granulocytes: 0 %
Lymphocytes Relative: 33 %
Lymphs Abs: 1.4 10*3/uL (ref 0.7–4.0)
MCH: 32.2 pg (ref 26.0–34.0)
MCHC: 34.7 g/dL (ref 30.0–36.0)
MCV: 92.8 fL (ref 80.0–100.0)
Monocytes Absolute: 0.4 10*3/uL (ref 0.1–1.0)
Monocytes Relative: 9 %
Neutro Abs: 2.3 10*3/uL (ref 1.7–7.7)
Neutrophils Relative %: 53 %
Platelets: 271 10*3/uL (ref 150–400)
RBC: 4.16 MIL/uL — ABNORMAL LOW (ref 4.22–5.81)
RDW: 11.9 % (ref 11.5–15.5)
WBC: 4.3 10*3/uL (ref 4.0–10.5)
nRBC: 0 % (ref 0.0–0.2)

## 2019-06-23 LAB — PSA: Prostatic Specific Antigen: 9.47 ng/mL — ABNORMAL HIGH (ref 0.00–4.00)

## 2019-06-30 ENCOUNTER — Telehealth (HOSPITAL_BASED_OUTPATIENT_CLINIC_OR_DEPARTMENT_OTHER): Payer: BC Managed Care – PPO | Admitting: Hematology

## 2019-06-30 DIAGNOSIS — C61 Malignant neoplasm of prostate: Secondary | ICD-10-CM | POA: Diagnosis not present

## 2019-06-30 NOTE — Progress Notes (Signed)
Virtual Visit via Video Note  I connected with Scott [name] on 06/30/19 at  3:20 PM EDT by a video enabled telemedicine application and verified that I am speaking with the correct person using two identifiers.   I discussed the limitations of evaluation and management by telemedicine and the availability of in person appointments. The patient expressed understanding and agreed to proceed.  History of Present Illness: He is seen in our clinic for metastatic castration refractory prostate cancer.  Last Lupron injection was on 04/11/2019, 45 mg.   Observations/Objective: Denies any new onset pains.  He is taking Avodart 0.5 mg daily.  Reports weak urinary stream which is improving.  Also taking melatonin 40 mg and Essiac tea.  Assessment and Plan:  1.  Metastatic CRPC: -Lupron 45 mg on 04/10/2019 with Dr.Wren. -Taking Avodart 0.5 mg daily. -Reviewed labs from 06/23/2019.  PSA improved to 9.47 from 9.96 on 04/23/2019. -I will see him back in 3 months for follow-up with repeat PSA.  He has follow-up with Dr. Roni Bread soon.  2.  Family history: -Because of his extensive family history, I have recommended germline mutation testing.  He has canceled the testing last couple of times.  He is considering testing at this time.  We will arrange it in the next few months.  3.  Hot flashes: -He is using estradiol patch every 3 to 5 days.   Follow Up Instructions: RTC 3 months with labs.   I discussed the assessment and treatment plan with the patient. The patient was provided an opportunity to ask questions and all were answered. The patient agreed with the plan and demonstrated an understanding of the instructions.   The patient was advised to call back or seek an in-person evaluation if the symptoms worsen or if the condition fails to improve as anticipated.  I provided  minutes of non-face-to-face time during this encounter.   [name], MD

## 2019-07-01 ENCOUNTER — Encounter (HOSPITAL_COMMUNITY): Payer: Self-pay | Admitting: Hematology

## 2019-07-01 NOTE — Addendum Note (Signed)
Addended by: Farley Ly on: 07/01/2019 03:35 PM   Modules accepted: Orders

## 2019-07-10 ENCOUNTER — Other Ambulatory Visit: Payer: Self-pay

## 2019-07-10 ENCOUNTER — Ambulatory Visit (INDEPENDENT_AMBULATORY_CARE_PROVIDER_SITE_OTHER): Payer: BC Managed Care – PPO | Admitting: Urology

## 2019-07-10 VITALS — BP 156/80 | HR 48 | Temp 96.6°F | Ht 66.0 in | Wt 171.0 lb

## 2019-07-10 DIAGNOSIS — C61 Malignant neoplasm of prostate: Secondary | ICD-10-CM | POA: Diagnosis not present

## 2019-07-10 LAB — POCT URINALYSIS DIPSTICK
Bilirubin, UA: NEGATIVE
Glucose, UA: NEGATIVE
Ketones, UA: NEGATIVE
Leukocytes, UA: NEGATIVE
Nitrite, UA: NEGATIVE
Protein, UA: NEGATIVE
Spec Grav, UA: 1.01 (ref 1.010–1.025)
Urobilinogen, UA: 0.2 E.U./dL
pH, UA: 5 (ref 5.0–8.0)

## 2019-07-10 NOTE — Progress Notes (Signed)
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 CT with bilateral non-bulky iliac adenopathy and tiny uptake Lt orbit (no spine mets) by bone scan.  He has had a recent PSA of 9.47 which was a slight decline and the testosterone remains castrate at 6 on Lupron and dutasteride. He remains on estradiol 0.1mg  patches for the hot flashes.   He had a telehealth visit with Dr. Tammi Klippel for consideration of radiation therapy to the primary and pelvic nodes from the [place] PET findings on 12/11/18.  He is seeing Dr. Delton Coombes as well but is  reluctant to consider Zytiga because of the prednisone.   He has had no recent hematuria.  He has occasional frequency with urgency and UUI.  He wears a pad. He has a reduced stream.  He has nocturia 3-4x but he is drinking a lot of fluids.    Present Management:  01/2017 begin Lupron androgen deprivation Q64mo (declined reccomended orchiectomy)  04/2017 - PSA 2.86;  07/2017 PSA/T 3.44 / T 3 ==> Lupron 45mg .  10/08/17 PSA 2.1.  02/07/18 PSA 2.19/T 4. added Estradiol 0.1mg  patches twice weekly for the hot flashes.  03/10/18 PSA 1.87/T<3.  05/09/18 PSA 2.09/T10/ Est 32.  07/24/18 PSA 6/T 54  08/08/18 Lupron 45mg .  09/08/18 PSA 4.37  10/22/18 PSA 4.64/T<3.  11/26/18 Bone scan negative/ CT C/A/P negative. 12/11/18 [facility] PET small equivocal periaortic and left ext iliac nodes and right prostate activity.  I have discussed that with him.  11/28/18 PSA 6.46. 02/10/19 PSA 7.31/T <3. 03/19/19 PSA 8.20 04/11/19  Lupron 45mg  given.     Operative cysto / TURP 2018 with inviltrative bladder neck / prostate mass ( left UO purposefully resected).     Results for IRVINE, MORDEN (MRN [id number]) as of 07/10/2019 08:56  Ref. Range 11/28/2018 10:23 02/10/2019 13:25 03/19/2019 13:49 04/23/2019 13:58 06/23/2019 13:41  Prostatic Specific Antigen Latest  Ref Range: 0.00 - 4.00 ng/mL 6.46 (H) 7.31 (H) 8.20 (H) 9.96 (H) 9.47 (H)     IPSS    Row Name 07/10/19 1400         International Prostate Symptom Score   How often have you had the sensation of not emptying your bladder?  Not at All     How often have you had to urinate less than every two hours?  More than half the time     How often have you found you stopped and started again several times when you urinated?  Almost always     How often have you found it difficult to postpone urination?  Almost always     How often have you had a weak urinary stream?  Not at All     How often have you had to strain to start urination?  Not at All     How many times did you typically get up at night to urinate?  4 Times     Total IPSS Score  18       Quality of Life due to urinary symptoms   If you were to spend the rest of your life with your urinary condition just the way it is now how would you feel about that?  Mostly Disatisfied      He has gotten his COVID vaccine.      ROS:  ROS:  A complete  review of systems was performed.  All systems are negative except for pertinent findings as noted.   Review of Systems  Constitutional: Positive for malaise/fatigue and weight loss (20 lb with diet).       Hot flushing controlled with estradiol.   Respiratory: Positive for shortness of breath.   Musculoskeletal: Positive for back pain and joint pain.    Allergies  Allergen Reactions  . Wellbutrin [Bupropion]     Hives     Outpatient Encounter Medications as of 07/10/2019  Medication Sig  . Barberry-Oreg Grape-Goldenseal (BERBERINE COMPLEX) 200-200-50 MG CAPS Take 2 capsules by mouth daily.  . Cholecalciferol (D 5000) 5000 units capsule Take 20,000 Units by mouth daily.   Marland Kitchen dutasteride (AVODART) 0.5 MG capsule Take 1 capsule (0.5 mg total) by mouth daily.  Marland Kitchen estradiol (VIVELLE-DOT) 0.1 MG/24HR patch Place 1 patch (0.1 mg total) onto the skin 2 (two) times a week. For management of hot  flashes for ADT.  Marland Kitchen Leuprolide Acetate, 6 Month, (LUPRON DEPOT, 68-MONTH,) 45 MG injection Inject 45 mg into the muscle every 6 (six) months.  . Melatonin 10 MG TABS Take 4 tablets by mouth every evening.   . Nattokinase 100 MG CAPS Take 1 capsule by mouth every evening.  Marland Kitchen OVER THE COUNTER MEDICATION Take 2 capsules by mouth daily. Liver Blend : Milk thistle and Dandelion root 475 mg  . OVER THE COUNTER MEDICATION Take 3,000 mg by mouth daily. [place] Tail Mushroom  Anti-Cancer /Immune Scientist, product/process development  . OVER THE COUNTER MEDICATION daily. CBD oil daily  . Quercetin 250 MG TABS Take 500 mg by mouth 2 (two) times daily.  . TURMERIC CURCUMIN PO Take 750 mg by mouth daily.  . vitamin B-12 (CYANOCOBALAMIN) 1000 MCG tablet Take 1,000 mcg by mouth daily.   No facility-administered encounter medications on file as of 07/10/2019.    Past Medical History:  Diagnosis Date  . Essential hypertension, benign 01/26/2019  . HLD (hyperlipidemia) 01/26/2019  . Prostate cancer ([place])   . Vitamin D deficiency disease 01/26/2019    Past Surgical History:  Procedure Laterality Date  . HERNIA REPAIR  [id number]   UMBILICAL   . PROSTATE SURGERY    . TONSILLECTOMY Bilateral   . TRANSURETHRAL RESECTION OF PROSTATE N/A 01/25/2017   Procedure: TRANSURETHRAL RESECTION OF THE PROSTATE (TURP);  Surgeon: Alexis Frock, MD;  Location: [facility];  Service: Urology;  Laterality: N/A;  . WISDOM TOOTH EXTRACTION      Social History   Socioeconomic History  . Marital status: Married    Spouse name: Not on file  . Number of children: 1  . Years of education: Not on file  . Highest education level: Not on file  Occupational History    Comment: working full time  Tobacco Use  . Smoking status: Former Smoker    Packs/day: 1.50    Years: 35.00    Pack years: 52.50    Types: Cigarettes    Quit date: 01/12/2004    Years since quitting: 15.5  . Smokeless tobacco: Never Used  . Tobacco comment: OVER 30 YEARS HX OF SMOKING    Substance and Sexual Activity  . Alcohol use: No  . Drug use: No  . Sexual activity: Not Currently  Other Topics Concern  . Not on file  Social History Narrative   Married for 19 years.Works for [employer] from home.   Social Determinants of Health   Financial Resource Strain: Low Risk   . Difficulty of Paying Living  Expenses: Not hard at all  Food Insecurity: No Food Insecurity  . Worried About Charity fundraiser in the Last Year: Never true  . Ran Out of Food in the Last Year: Never true  Transportation Needs: No Transportation Needs  . Lack of Transportation (Medical): No  . Lack of Transportation (Non-Medical): No  Physical Activity: Inactive  . Days of Exercise per Week: 0 days  . Minutes of Exercise per Session: 0 min  Stress: No Stress Concern Present  . Feeling of Stress : Not at all  Social Connections: Slightly Isolated  . Frequency of Communication with Friends and Family: Twice a week  . Frequency of Social Gatherings with Friends and Family: Once a week  . Attends Religious Services: Never  . Active Member of Clubs or Organizations: Yes  . Attends Archivist Meetings: Never  . Marital Status: Married  Human resources officer Violence: Not At Risk  . Fear of Current or Ex-Partner: No  . Emotionally Abused: No  . Physically Abused: No  . Sexually Abused: No    Family History  Problem Relation Age of Onset  . Emphysema Mother   . Alzheimer's disease Father   . Prostate cancer Father        dx in his 32s  . Prostate cancer Brother        dx in his 67s  . Breast cancer Maternal Grandmother   . Pancreatic cancer Maternal Grandfather   . Dementia Paternal Grandmother   . Heart attack Paternal Grandfather        Objective: Vitals:   07/10/19 1403  BP: (!) 156/80  Pulse: (!) 487  Temp: (!) 96.6 F (35.9 C)     Physical ExamI    Assessment & Plan: Metastatic Castrate Resistant prostate cancer with a rising PSA on Lupron,  dutasteride and Estradiol.  He is seeing Dr. Delton Coombes but has been reluctant to consider Zytiga and prednisone because of the possible immunosuppressant effect during the Covid pandemic.   He has been able to get the vaccine.   He will see me in 3 months with labs.     Hot flashes.   He has good control with the Estradiol which he thinks has helped his overall mood, energy and sense of well being. .    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   He is content with his voiding symptoms at this time.   No orders of the defined types were placed in this encounter.    Orders Placed This Encounter  Procedures  . PSA    Standing Status:   Future    Standing Expiration Date:   07/09/2020  . Testosterone    Standing Status:   Future    Standing Expiration Date:   07/09/2020  . POCT urinalysis dipstick      Return in about 3 months (around 10/09/2019) for For Lupron/Eligard 45mg . .   CC: Doree Albee, MD, Dr. [name].     Irine Seal 07/10/2019

## 2019-07-24 ENCOUNTER — Telehealth: Payer: Self-pay | Admitting: Urology

## 2019-07-24 NOTE — Telephone Encounter (Signed)
Support plus called regarding a lupron referral. She asked that you call her at [phone] ext [phone]

## 2019-07-27 ENCOUNTER — Other Ambulatory Visit: Payer: Self-pay

## 2019-07-27 ENCOUNTER — Ambulatory Visit (INDEPENDENT_AMBULATORY_CARE_PROVIDER_SITE_OTHER): Payer: BC Managed Care – PPO | Admitting: Internal Medicine

## 2019-07-27 ENCOUNTER — Encounter (INDEPENDENT_AMBULATORY_CARE_PROVIDER_SITE_OTHER): Payer: Self-pay | Admitting: Internal Medicine

## 2019-07-27 VITALS — BP 122/70 | HR 66 | Temp 97.1°F | Ht 66.0 in | Wt 175.0 lb

## 2019-07-27 DIAGNOSIS — E559 Vitamin D deficiency, unspecified: Secondary | ICD-10-CM

## 2019-07-27 DIAGNOSIS — E782 Mixed hyperlipidemia: Secondary | ICD-10-CM

## 2019-07-27 LAB — LIPID PANEL
Cholesterol: 202 mg/dL — ABNORMAL HIGH (ref <200)
HDL: 52 mg/dL (ref >=40)
LDL Cholesterol (Calc): 133 mg/dL (calc) — ABNORMAL HIGH
Non-HDL Cholesterol (Calc): 150 mg/dL (calc) — ABNORMAL HIGH (ref <130)
Total CHOL/HDL Ratio: 3.9 (calc) (ref <5.0)
Triglycerides: 78 mg/dL (ref <150)

## 2019-07-27 LAB — COMPLETE METABOLIC PANEL WITH GFR
AG Ratio: 2.2 (calc) (ref 1.0–2.5)
ALT: 9 U/L (ref 9–46)
AST: 15 U/L (ref 10–35)
Albumin: 4 g/dL (ref 3.6–5.1)
Alkaline phosphatase (APISO): 48 U/L (ref 35–144)
BUN: 11 mg/dL (ref 7–25)
CO2: 23 mmol/L (ref 20–32)
Calcium: 8.9 mg/dL (ref 8.6–10.3)
Chloride: 105 mmol/L (ref 98–110)
Creat: 0.74 mg/dL (ref 0.70–1.25)
GFR, Est African American: 115 mL/min/{1.73_m2} (ref >=60)
GFR, Est Non African American: 99 mL/min/{1.73_m2} (ref >=60)
Globulin: 1.8 g/dL (calc) — ABNORMAL LOW (ref 1.9–3.7)
Glucose, Bld: 98 mg/dL (ref 65–99)
Potassium: 3.9 mmol/L (ref 3.5–5.3)
Sodium: 136 mmol/L (ref 135–146)
Total Bilirubin: 0.7 mg/dL (ref 0.2–1.2)
Total Protein: 5.8 g/dL — ABNORMAL LOW (ref 6.1–8.1)

## 2019-07-27 LAB — VITAMIN D 25 HYDROXY (VIT D DEFICIENCY, FRACTURES): Vit D, 25-Hydroxy: 96 ng/mL (ref 30–100)

## 2019-07-27 NOTE — Telephone Encounter (Signed)
Needed dosage of lupron 45mg  verified.

## 2019-07-27 NOTE — Progress Notes (Signed)
Metrics: Intervention Frequency ACO  Documented Smoking Status Yearly  Screened one or more times in 24 months  Cessation Counseling or  Active cessation medication Past 24 months  Past 24 months   Guideline developer: UpToDate (See UpToDate for funding source) Date Released: 2014       Wellness Office Visit  Subjective:  Patient ID: Scott [name], male    DOB: Jan 10, 1956  Age: 64 y.o. MRN: [id number]  CC: This man comes in for follow-up of hypertension, hyperlipidemia, vitamin D deficiency and prostate cancer.  HPI  He is seeing urology on a regular basis, Dr. Donnel Saxon his prostate cancer as well as seeing the oncologist Dr. Delton Coombes.  He continues with estradiol which is helping his symptoms tremendously. He has managed to lose weight since the last time I saw him almost 20 pounds.  He is eating better and doing forms of intermittent fasting.  He is not exercising on a regular basis. Past Medical History:  Diagnosis Date  . Essential hypertension, benign 01/26/2019  . HLD (hyperlipidemia) 01/26/2019  . Prostate cancer ([place])   . Vitamin D deficiency disease 01/26/2019      Family History  Problem Relation Age of Onset  . Emphysema Mother   . Alzheimer's disease Father   . Prostate cancer Father        dx in his 58s  . Prostate cancer Brother        dx in his 37s  . Breast cancer Maternal Grandmother   . Pancreatic cancer Maternal Grandfather   . Dementia Paternal Grandmother   . Heart attack Paternal Grandfather     Social History   Social History Narrative   Married for 19 years.Works for [employer] from home.   Social History   Tobacco Use  . Smoking status: Former Smoker    Packs/day: 1.50    Years: 35.00    Pack years: 52.50    Types: Cigarettes    Quit date: 01/12/2004    Years since quitting: 15.5  . Smokeless tobacco: Never Used  . Tobacco comment: OVER 30 YEARS HX OF SMOKING   Substance Use Topics  . Alcohol use: No    Current Meds    Medication Sig  . Barberry-Oreg Grape-Goldenseal (BERBERINE COMPLEX) 200-200-50 MG CAPS Take 2 capsules by mouth daily.  . Cholecalciferol (D 5000) 5000 units capsule Take 20,000 Units by mouth daily.   Marland Kitchen dutasteride (AVODART) 0.5 MG capsule Take 1 capsule (0.5 mg total) by mouth daily.  Marland Kitchen estradiol (VIVELLE-DOT) 0.1 MG/24HR patch Place 1 patch (0.1 mg total) onto the skin 2 (two) times a week. For management of hot flashes for ADT.  Marland Kitchen Leuprolide Acetate, 6 Month, (LUPRON DEPOT, 37-MONTH,) 45 MG injection Inject 45 mg into the muscle every 6 (six) months.  . Melatonin 10 MG TABS Take 40 tablets by mouth every evening.   . Nattokinase 100 MG CAPS Take 1 capsule by mouth every evening.  Marland Kitchen OVER THE COUNTER MEDICATION Take 3,000 mg by mouth daily. [place] Tail Mushroom  Anti-Cancer /Immune Scientist, product/process development  . OVER THE COUNTER MEDICATION daily. CBD oil daily  . Quercetin 250 MG TABS Take 500 mg by mouth 2 (two) times daily.  . TURMERIC CURCUMIN PO Take 750 mg by mouth daily.  . vitamin B-12 (CYANOCOBALAMIN) 1000 MCG tablet Take 1,000 mcg by mouth daily.       Objective:   Today's Vitals: BP 122/70 (BP Location: Left Arm, Patient Position: Sitting, Cuff Size: Normal)   Pulse 66  Temp (!) 97.1 F (36.2 C) (Temporal)   Ht 5\' 6"  (1.676 m)   Wt 175 lb (79.4 kg)   SpO2 98% Comment: wearing mask.  BMI 28.25 kg/m  Vitals with BMI 07/27/2019 07/10/2019 04/28/2019  Height 5\' 6"  5\' 6"  -  Weight 175 lbs 171 lbs 194 lbs  BMI 28.26 0000000 [id number]  Systolic 123XX123 [id number]  Diastolic 70 80 86  Pulse 66 48 80     Physical Exam  He looks systemically well.  He has lost almost 20 pounds since the last time I saw him.  His blood pressure is excellent.     Assessment   1. Mixed hyperlipidemia   2. Vitamin D deficiency disease       Tests ordered Orders Placed This Encounter  Procedures  . COMPLETE METABOLIC PANEL WITH GFR  . VITAMIN D 25 Hydroxy (Vit-D Deficiency, Fractures)  . Lipid panel      Plan: 1. Blood work is ordered above. 2. His hyperlipidemia has not required statin therapy and he does not have a history of coronary artery disease.  We will see what the lipid panel shows now that he has lost the weight. 3. He will continue on vitamin D3 20,000 units daily and we will check levels today. 4. I also discussed the importance of exercise which will help him maintain muscle mass and help cardiovascular fitness.  We discussed a rower that might help him. 5. I will see him in about 6 months time for his annual physical exam.   No orders of the defined types were placed in this encounter.   Doree Albee, MD

## 2019-09-28 ENCOUNTER — Other Ambulatory Visit: Payer: Self-pay

## 2019-09-28 ENCOUNTER — Inpatient Hospital Stay (HOSPITAL_COMMUNITY): Payer: BC Managed Care – PPO | Attending: Hematology

## 2019-09-28 DIAGNOSIS — C61 Malignant neoplasm of prostate: Secondary | ICD-10-CM | POA: Diagnosis not present

## 2019-09-28 LAB — CBC WITH DIFFERENTIAL/PLATELET
Abs Immature Granulocytes: 0.02 10*3/uL (ref 0.00–0.07)
Basophils Absolute: 0.1 10*3/uL (ref 0.0–0.1)
Basophils Relative: 1 %
Eosinophils Absolute: 0.2 10*3/uL (ref 0.0–0.5)
Eosinophils Relative: 2 %
HCT: 39 % (ref 39.0–52.0)
Hemoglobin: 13.5 g/dL (ref 13.0–17.0)
Immature Granulocytes: 0 %
Lymphocytes Relative: 25 %
Lymphs Abs: 1.5 10*3/uL (ref 0.7–4.0)
MCH: 31.6 pg (ref 26.0–34.0)
MCHC: 34.6 g/dL (ref 30.0–36.0)
MCV: 91.3 fL (ref 80.0–100.0)
Monocytes Absolute: 0.5 10*3/uL (ref 0.1–1.0)
Monocytes Relative: 7 %
Neutro Abs: 3.9 10*3/uL (ref 1.7–7.7)
Neutrophils Relative %: 65 %
Platelets: 253 10*3/uL (ref 150–400)
RBC: 4.27 MIL/uL (ref 4.22–5.81)
RDW: 11.9 % (ref 11.5–15.5)
WBC: 6.2 10*3/uL (ref 4.0–10.5)
nRBC: 0 % (ref 0.0–0.2)

## 2019-09-28 LAB — COMPREHENSIVE METABOLIC PANEL
ALT: 15 U/L (ref 0–44)
AST: 20 U/L (ref 15–41)
Albumin: 4.3 g/dL (ref 3.5–5.0)
Alkaline Phosphatase: 55 U/L (ref 38–126)
Anion gap: 10 (ref 5–15)
BUN: 15 mg/dL (ref 8–23)
CO2: 22 mmol/L (ref 22–32)
Calcium: 9.1 mg/dL (ref 8.9–10.3)
Chloride: 105 mmol/L (ref 98–111)
Creatinine, Ser: 0.95 mg/dL (ref 0.61–1.24)
GFR calc Af Amer: >60 mL/min (ref >60)
GFR calc non Af Amer: >60 mL/min (ref >60)
Glucose, Bld: 101 mg/dL — ABNORMAL HIGH (ref 70–99)
Potassium: 4.2 mmol/L (ref 3.5–5.1)
Sodium: 137 mmol/L (ref 135–145)
Total Bilirubin: 0.8 mg/dL (ref 0.3–1.2)
Total Protein: 7.2 g/dL (ref 6.5–8.1)

## 2019-09-28 LAB — PSA: Prostatic Specific Antigen: 15.86 ng/mL — ABNORMAL HIGH (ref 0.00–4.00)

## 2019-09-29 ENCOUNTER — Inpatient Hospital Stay (HOSPITAL_BASED_OUTPATIENT_CLINIC_OR_DEPARTMENT_OTHER): Payer: BC Managed Care – PPO | Admitting: Hematology

## 2019-09-29 DIAGNOSIS — C61 Malignant neoplasm of prostate: Secondary | ICD-10-CM | POA: Diagnosis not present

## 2019-09-29 LAB — TESTOSTERONE: Testosterone: <3 ng/dL — ABNORMAL LOW (ref 264–916)

## 2019-09-29 NOTE — Progress Notes (Signed)
Virtual Visit via Telephone Note  I connected with Scott [name] on 09/29/19 at  3:00 PM EDT by telephone and verified that I am speaking with the correct person using two identifiers.   I discussed the limitations, risks, security and privacy concerns of performing an evaluation and management service by telephone and the availability of in person appointments. I also discussed with the patient that there may be a patient responsible charge related to this service. The patient expressed understanding and agreed to proceed.   History of Present Illness: He is followed in our clinic for metastatic castration resistant prostate cancer.  He is currently on Lupron and Avodart.   Observations/Objective: He reports that he has been taking Avodart 0.5 mg daily.  Reports appetite 100% and energy levels of 50%.  Reports slow urinary stream and some sleep problems which are stable.  Assessment and Plan:  1.  Metastatic castration refractory prostate cancer: -Diagnosed in November 2018 with bulky pelvic adenopathy.  No record of bone meta stasis. -Last Lupron 45 mg on 04/10/2019 with Dr. Roni Bread. -He is continuing Avodart 0.5 mg daily. -Reviewed labs dated 09/28/2019.  Testosterone is less than 3 and PSA is 15.86.  It went up from 9.47 on 06/23/2019.  Creatinine also increased to 0.95, from baseline around 0.7. -I have recommended repeat scans and Zytiga with prednisone.  However he is reluctant to consider Zytiga at this time because of side effects.   -He is interested in using higher dose estrogen with 3 to 4, 100 mcg patches per 24-hour, changed twice weekly based on PATCH trial as a substitute to Lupron.  He will talk about it at his appointment with Dr.Wren on 10/09/2019. -However higher dose estrogen is unlikely to help bring down the PSA levels as he is already castrate resistant. -I have given him an appointment in 2 months.  But he was told to call me after his appointment with Dr.Wren.  2.  Family  history: -Because of his extensive family history, we will consider also doing genetic testing.  3.  Hot flashes: -He is using estradiol 100 mcg patch and changing every 4 days.   Follow Up Instructions:    I discussed the assessment and treatment plan with the patient. The patient was provided an opportunity to ask questions and all were answered. The patient agreed with the plan and demonstrated an understanding of the instructions.   The patient was advised to call back or seek an in-person evaluation if the symptoms worsen or if the condition fails to improve as anticipated.  I provided 23 minutes of non-face-to-face time during this encounter.   [name], MD

## 2019-10-08 NOTE — Progress Notes (Signed)
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 CT with bilateral non-bulky iliac adenopathy and tiny uptake Lt orbit (no spine mets) by bone scan.  His PSA is up to 15.5 from  9.47 at last check and a doubling over the last year. The testosterone remains castrate at <3 on Lupron and dutasteride. He remains on estradiol 0.1mg  patches for the hot flashes.   He had a telehealth visit with Dr. Tammi Klippel for consideration of radiation therapy to the primary and pelvic nodes from the [place] PET findings on 12/11/18 but decided against that.  He is seeing Dr. Delton Coombes as well but is  reluctant to consider Zytiga because of the prednisone.   He has had no recent hematuria.  He has occasional frequency with urgency and UUI.  He wears a pad. He has a reduced stream.  He has nocturia 3-4x but he is drinking a lot of fluids.   His IPSS is 29-30.   Present Management:  01/2017 begin Lupron androgen deprivation Q77mo (declined reccomended orchiectomy)  04/2017 - PSA 2.86;  07/2017 PSA/T 3.44 / T 3 ==> Lupron 45mg .  10/08/17 PSA 2.1.  02/07/18 PSA 2.19/T 4. added Estradiol 0.1mg  patches twice weekly for the hot flashes.  03/10/18 PSA 1.87/T<3.  05/09/18 PSA 2.09/T10/ Est 32.  07/24/18 PSA 6/T 54  08/08/18 Lupron 45mg .  09/08/18 PSA 4.37  10/22/18 PSA 4.64/T<3.  11/26/18 Bone scan negative/ CT C/A/P negative. 12/11/18 Axumin PET small equivocal periaortic and left ext iliac nodes and right prostate activity.  I have discussed that with him.  11/28/18 PSA 6.46. 02/10/19 PSA 7.31/T <3. 03/19/19 PSA 8.20 04/11/19  Lupron 45mg  given.  04/23/19 PSA 9.96. 06/23/19 PSA 9.47. 09/28/19 PSA 15.56, T <3.     Operative cysto / TURP 2018 with inviltrative bladder neck / prostate mass ( left UO purposefully resected).     Results for VIRAAT, VANPATTEN (MRN [id number]) as of 07/10/2019 08:56   Ref. Range 11/28/2018 10:23 02/10/2019 13:25 03/19/2019 13:49 04/23/2019 13:58 06/23/2019 13:41  Prostatic Specific Antigen Latest Ref Range: 0.00 - 4.00 ng/mL 6.46 (H) 7.31 (H) 8.20 (H) 9.96 (H) 9.47 (H)      IPSS    Row Name 10/09/19 1400         International Prostate Symptom Score   How often have you had the sensation of not emptying your bladder? Less than half the time     How often have you had to urinate less than every two hours? Almost always     How often have you found you stopped and started again several times when you urinated? Almost always     How often have you found it difficult to postpone urination? More than half the time     How often have you had a weak urinary stream? Almost always     How often have you had to strain to start urination? More than half the time     How many times did you typically get up at night to urinate? 4 Times     Total IPSS Score 29       Quality of Life due to urinary symptoms   If you were to spend the rest of your life with your urinary condition just the way it is now how would you feel about that? Mixed  He has gotten his COVID vaccine.      ROS:  ROS:  A complete review of systems was performed.  All systems are negative except for pertinent findings as noted.   ROS  Allergies  Allergen Reactions  . Wellbutrin [Bupropion]     Hives     Outpatient Encounter Medications as of 10/09/2019  Medication Sig Note  . Barberry-Oreg Grape-Goldenseal (BERBERINE COMPLEX) 200-200-50 MG CAPS Take 2 capsules by mouth daily.   . Cholecalciferol (D 5000) 5000 units capsule Take 20,000 Units by mouth daily.    Marland Kitchen dutasteride (AVODART) 0.5 MG capsule Take 1 capsule (0.5 mg total) by mouth daily.   Marland Kitchen estradiol (VIVELLE-DOT) 0.1 MG/24HR patch Place 1 patch (0.1 mg total) onto the skin 2 (two) times a week. For management of hot flashes for ADT.   Marland Kitchen Leuprolide Acetate, 6 Month, (LUPRON DEPOT, 71-MONTH,) 45 MG injection Inject 45 mg  into the muscle every 6 (six) months.   . Melatonin 10 MG TABS Take 40 tablets by mouth every evening.  07/27/2019: Now taking 40 mg at night.  . Nattokinase 100 MG CAPS Take 1 capsule by mouth in the morning and at bedtime.    Marland Kitchen OVER THE COUNTER MEDICATION Take 3,000 mg by mouth daily. [place] Tail Mushroom  Anti-Cancer /Immune Scientist, product/process development   . OVER THE COUNTER MEDICATION daily. CBD oil daily   . Quercetin 250 MG TABS Take 500 mg by mouth 2 (two) times daily.   . TURMERIC CURCUMIN PO Take 750 mg by mouth daily.   . vitamin B-12 (CYANOCOBALAMIN) 1000 MCG tablet Take 1,000 mcg by mouth daily.   . [EXPIRED] Leuprolide Acetate (6 Month) (LUPRON) injection 45 mg     No facility-administered encounter medications on file as of 10/09/2019.    Past Medical History:  Diagnosis Date  . Essential hypertension, benign 01/26/2019  . HLD (hyperlipidemia) 01/26/2019  . Prostate cancer ([place])   . Vitamin D deficiency disease 01/26/2019    Past Surgical History:  Procedure Laterality Date  . HERNIA REPAIR  2458   UMBILICAL   . PROSTATE SURGERY    . TONSILLECTOMY Bilateral   . TRANSURETHRAL RESECTION OF PROSTATE N/A 01/25/2017   Procedure: TRANSURETHRAL RESECTION OF THE PROSTATE (TURP);  Surgeon: Alexis Frock, MD;  Location: [facility];  Service: Urology;  Laterality: N/A;  . WISDOM TOOTH EXTRACTION      Social History   Socioeconomic History  . Marital status: Married    Spouse name: Not on file  . Number of children: 1  . Years of education: Not on file  . Highest education level: Not on file  Occupational History    Comment: working full time  Tobacco Use  . Smoking status: Former Smoker    Packs/day: 1.50    Years: 35.00    Pack years: 52.50    Types: Cigarettes    Quit date: 01/12/2004    Years since quitting: 15.7  . Smokeless tobacco: Never Used  . Tobacco comment: OVER 30 YEARS HX OF SMOKING   Vaping Use  . Vaping Use: Never used  Substance and Sexual Activity  . Alcohol use: No  .  Drug use: No  . Sexual activity: Not Currently  Other Topics Concern  . Not on file  Social History Narrative   Married for 19 years.Works for [employer] from home.   Social Determinants of Health   Financial Resource Strain: Low Risk   . Difficulty of Paying Living Expenses: Not hard at  all  Food Insecurity: No Food Insecurity  . Worried About Charity fundraiser in the Last Year: Never true  . Ran Out of Food in the Last Year: Never true  Transportation Needs: No Transportation Needs  . Lack of Transportation (Medical): No  . Lack of Transportation (Non-Medical): No  Physical Activity: Inactive  . Days of Exercise per Week: 0 days  . Minutes of Exercise per Session: 0 min  Stress: No Stress Concern Present  . Feeling of Stress : Not at all  Social Connections: Moderately Integrated  . Frequency of Communication with Friends and Family: Twice a week  . Frequency of Social Gatherings with Friends and Family: Once a week  . Attends Religious Services: Never  . Active Member of Clubs or Organizations: Yes  . Attends Archivist Meetings: Never  . Marital Status: Married  Human resources officer Violence: Not At Risk  . Fear of Current or Ex-Partner: No  . Emotionally Abused: No  . Physically Abused: No  . Sexually Abused: No    Family History  Problem Relation Age of Onset  . Emphysema Mother   . Alzheimer's disease Father   . Prostate cancer Father        dx in his 102s  . Prostate cancer Brother        dx in his 89s  . Breast cancer Maternal Grandmother   . Pancreatic cancer Maternal Grandfather   . Dementia Paternal Grandmother   . Heart attack Paternal Grandfather        Objective: Vitals:   10/09/19 1346  BP: (!) 184/92  Pulse: 68  Temp: (!) 95.9 F (35.5 C)     Physical Exam Vitals reviewed.  Constitutional:      Appearance: Normal appearance.  Genitourinary:    Comments: AP without lesions. NST without mass. Prostate 4+, diffusely  indurated with extension in the the left SV area.  Lymphadenopathy:     Cervical:     Right cervical: No superficial cervical adenopathy.    Left cervical: No superficial cervical adenopathy.     Upper Body:     Right upper body: No supraclavicular or axillary adenopathy.     Left upper body: No supraclavicular or axillary adenopathy.  Neurological:     General: No focal deficit present.     Mental Status: He is alert and oriented to person, place, and time.   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  He has been reluctant to consider second line hormonal manipulation.  I am going to set him up for restaging and discussed CT/Bone scan vs Axumin PET.   I will get him set up for the PET.    If he is found to have oligometastatic disease in addition to the local prostatic disease, he might still be a candidate for local therapy to help forestall progression but I strongly recommended he consider beginning Zytiga or an AR blocker or even possibly chemotherapy first before he goes on prednisone and I also recommended he get genetic testing to determine if he might eventually be a candidate for immune checkpoint inhibitors.  Provenge might be another options to consider but we didn't discuss that.   He had questions about stopping Lupron and increasing the estrogen patches but I didn't feel that was appropriate at this time.  We discussed a possible referral to an Oncologist with a specialty in  prostate cancer and I mentioned Dr. Thayer Jew at [facility] if he wants to pursue that avenue.    I will call him with the PET results and will go ahead and schedule his 6 month f/u with PSA since he will being seen by oncology in the interim.      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 His prostate exam is consistent with T4 disease and he has progressively worsening LUTS.   I once  again reinforced the need to consider second line therapy to help shrink the tumor.   He may benefit from local radiation therapy but that will depend on the PET and he may need a channel TURP if his symptoms worsen.    Hot flash due to medication He will continue the estradiol patch.     Meds ordered this encounter  Medications  . Leuprolide Acetate (6 Month) (LUPRON) injection 45 mg     Orders Placed This Encounter  Procedures  . NM PET (AXUMIN) SKULL BASE TO MID THIGH    Standing Status:   Future    Standing Expiration Date:   11/09/2019    Order Specific Question:   If indicated for the ordered procedure, I authorize the administration of a radiopharmaceutical per Radiology protocol    Answer:   Yes    Order Specific Question:   Preferred imaging location?    Answer:   [facility]    Order Specific Question:   Radiology Contrast Protocol - do NOT remove file path    Answer:   [email or web address]  . PSA, total and free    Standing Status:   Future    Standing Expiration Date:   10/08/2020  . Testosterone    Standing Status:   Future    Standing Expiration Date:   10/08/2020  . POCT urinalysis dipstick      Return in about 6 months (around 04/10/2020) for With PSA.   CC: Doree Albee, MD, Dr. [name].     Irine Seal 10/09/2019

## 2019-10-09 ENCOUNTER — Ambulatory Visit (INDEPENDENT_AMBULATORY_CARE_PROVIDER_SITE_OTHER): Payer: BC Managed Care – PPO | Admitting: Urology

## 2019-10-09 ENCOUNTER — Telehealth: Payer: Self-pay | Admitting: Urology

## 2019-10-09 ENCOUNTER — Ambulatory Visit: Payer: BC Managed Care – PPO | Admitting: Urology

## 2019-10-09 ENCOUNTER — Encounter: Payer: Self-pay | Admitting: Urology

## 2019-10-09 ENCOUNTER — Other Ambulatory Visit: Payer: Self-pay

## 2019-10-09 VITALS — BP 184/92 | HR 68 | Temp 95.9°F | Ht 66.0 in | Wt 172.0 lb

## 2019-10-09 DIAGNOSIS — R9721 Rising PSA following treatment for malignant neoplasm of prostate: Secondary | ICD-10-CM

## 2019-10-09 DIAGNOSIS — T50905A Adverse effect of unspecified drugs, medicaments and biological substances, initial encounter: Secondary | ICD-10-CM

## 2019-10-09 DIAGNOSIS — R232 Flushing: Secondary | ICD-10-CM | POA: Insufficient documentation

## 2019-10-09 DIAGNOSIS — R351 Nocturia: Secondary | ICD-10-CM | POA: Diagnosis not present

## 2019-10-09 DIAGNOSIS — R3912 Poor urinary stream: Secondary | ICD-10-CM

## 2019-10-09 DIAGNOSIS — C61 Malignant neoplasm of prostate: Secondary | ICD-10-CM

## 2019-10-09 DIAGNOSIS — N403 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: Secondary | ICD-10-CM

## 2019-10-09 LAB — POCT URINALYSIS DIPSTICK
Glucose, UA: NEGATIVE
Nitrite, UA: NEGATIVE
Protein, UA: NEGATIVE
Spec Grav, UA: <=1.005 — AB (ref 1.010–1.025)
Urobilinogen, UA: NEGATIVE E.U./dL — AB
pH, UA: 8.5 — AB (ref 5.0–8.0)

## 2019-10-09 MED ORDER — LEUPROLIDE ACETATE (6 MONTH) 45 MG IM KIT
45.0000 mg | PACK | Freq: Once | INTRAMUSCULAR | Status: AC
  Administered 2019-10-09: 45 mg via INTRAMUSCULAR

## 2019-10-09 NOTE — Assessment & Plan Note (Signed)
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  He has been reluctant to consider second line hormonal manipulation.  I am going to set him up for restaging and discussed CT/Bone scan vs Axumin PET.   I will get him set up for the PET.    If he is found to have oligometastatic disease in addition to the local prostatic disease, he might still be a candidate for local therapy to help forestall progression but I strongly recommended he consider beginning Zytiga or an AR blocker or even possibly chemotherapy first before he goes on prednisone and I also recommended he get genetic testing to determine if he might eventually be a candidate for immune checkpoint inhibitors.  Provenge might be another options to consider but we didn't discuss that.   He had questions about stopping Lupron and increasing the estrogen patches but I didn't feel that was appropriate at this time.  We discussed a possible referral to an Oncologist with a specialty in prostate cancer and I mentioned Dr. Thayer Jew at [facility] if he wants to pursue that avenue.    I will call him with the PET results and will go ahead and schedule his 6 month f/u with PSA since he will being seen by oncology in the interim.

## 2019-10-09 NOTE — Telephone Encounter (Signed)
I called [facility] to get pa for pts PET scan. She needs a nurse to call her at [phone] to answer clinical questions. She said just call that number and ask for a nurse. This particular scan has to be done at [facility].

## 2019-10-09 NOTE — Progress Notes (Signed)
Lupron IM Injection   Due to Prostate Cancer patient is present today for a Lupron Injection.  Medication: Lupron 6 month Dose: 45 mg  Location: left upper outer buttocks Lot: [id number] Exp: 04/10/22   Patient tolerated well, no complications were noted  Performed by: Antionette Char, Jona Zappone,LPN

## 2019-10-09 NOTE — Assessment & Plan Note (Signed)
His prostate exam is consistent with T4 disease and he has progressively worsening LUTS.   I once again reinforced the need to consider second line therapy to help shrink the tumor.   He may benefit from local radiation therapy but that will depend on the PET and he may need a channel TURP if his symptoms worsen.

## 2019-10-09 NOTE — Assessment & Plan Note (Signed)
He will continue the estradiol patch.

## 2019-10-09 NOTE — Progress Notes (Signed)
Urological Symptom Review  Patient is experiencing the following symptoms: Frequent urination Hard to postpone urination Burning/pain with urination Get up at night to urinate Leakage of urine Trouble starting stream Weak stream Erection problems (male only)   Review of Systems  Gastrointestinal (upper)  : Negative for upper GI symptoms  Gastrointestinal (lower) : Negative for lower GI symptoms  Constitutional : Negative for symptoms  Skin: Negative for skin symptoms  Eyes: Negative for eye symptoms  Ear/Nose/Throat : Negative for Ear/Nose/Throat symptoms  Hematologic/Lymphatic: Negative for Hematologic/Lymphatic symptoms  Cardiovascular : Negative for cardiovascular symptoms  Respiratory : Shortness of breath  Endocrine: Negative for endocrine symptoms  Musculoskeletal: Back pain Joint pain  Neurological: Negative for neurological symptoms  Psychologic: Negative for psychiatric symptoms

## 2019-10-13 ENCOUNTER — Telehealth: Payer: Self-pay | Admitting: Urology

## 2019-10-13 NOTE — Telephone Encounter (Signed)
Pt left Vm to ask when his scan would be scheduled, he has not heard from scheduling.

## 2019-10-14 NOTE — Telephone Encounter (Signed)
They are asking for Peer to Peer  of [phone] When you call the MD will be available.

## 2019-10-14 NOTE — Telephone Encounter (Signed)
Please see below.

## 2019-10-15 ENCOUNTER — Other Ambulatory Visit: Payer: Self-pay | Admitting: Urology

## 2019-10-15 ENCOUNTER — Telehealth: Payer: Self-pay | Admitting: Urology

## 2019-10-15 DIAGNOSIS — C61 Malignant neoplasm of prostate: Secondary | ICD-10-CM

## 2019-10-15 NOTE — Telephone Encounter (Signed)
I called pts insurance today, the CT and Bone scan were denied. They asked for a peer to peer. The number is [phone].

## 2019-10-15 NOTE — Telephone Encounter (Signed)
They denied coverage.   I have placed orders for a CT AP, Bone scan and CXR.

## 2019-10-15 NOTE — Telephone Encounter (Signed)
Please see below.

## 2019-10-20 NOTE — Telephone Encounter (Signed)
The studies have been approved.  #[id number]

## 2019-10-20 NOTE — Telephone Encounter (Signed)
See below

## 2019-10-22 ENCOUNTER — Other Ambulatory Visit (HOSPITAL_COMMUNITY): Payer: BC Managed Care – PPO

## 2019-10-27 ENCOUNTER — Encounter (HOSPITAL_COMMUNITY)
Admission: RE | Admit: 2019-10-27 | Discharge: 2019-10-27 | Disposition: A | Discharge status: Home / Self Care | Payer: BC Managed Care – PPO | Source: Ambulatory Visit | Attending: Urology | Admitting: Urology

## 2019-10-27 ENCOUNTER — Other Ambulatory Visit: Payer: Self-pay

## 2019-10-27 DIAGNOSIS — C61 Malignant neoplasm of prostate: Secondary | ICD-10-CM | POA: Diagnosis present

## 2019-10-27 MED ORDER — TECHNETIUM TC 99M MEDRONATE IV KIT
20.1000 | PACK | Freq: Once | INTRAVENOUS | Status: AC | PRN
  Administered 2019-10-27: 20.1 via INTRAVENOUS

## 2019-10-28 ENCOUNTER — Telehealth: Payer: Self-pay

## 2019-10-28 NOTE — Telephone Encounter (Signed)
He would need the oncology referral for the genetic testing.  That is something that they do.

## 2019-10-28 NOTE — Telephone Encounter (Signed)
Pt called and notified of results. Message sent to MD of pt question about genetic testing. Virtual visit scheduled for 8/20 - pt aware it will be at the end of the day .

## 2019-10-28 NOTE — Telephone Encounter (Signed)
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  We will get more clarity on these findings from the CT scan on 8/17.   He really needs to get seen by Oncology and will need second line therapy.   I don't see an appointment with [facility].   We had discussed a referral to a prostate cancer specialist, but I don't know where that stands.  If my schedule is full on 8/20, we may need to arrange a telehealth visit to discuss results when the CT is back.

## 2019-11-02 ENCOUNTER — Other Ambulatory Visit: Payer: Self-pay

## 2019-11-02 ENCOUNTER — Telehealth: Payer: Self-pay

## 2019-11-02 DIAGNOSIS — C61 Malignant neoplasm of prostate: Secondary | ICD-10-CM

## 2019-11-02 NOTE — Telephone Encounter (Signed)
-----   Message from Irine Seal, MD sent at 11/01/2019  9:18 AM EDT ----- I think it would be best to start with Dr. Alen Blew in [facility] for now since he is our local expert in urologic cancers.

## 2019-11-02 NOTE — Telephone Encounter (Signed)
Referral made and pt made aware.

## 2019-11-03 ENCOUNTER — Ambulatory Visit (HOSPITAL_COMMUNITY)
Admission: RE | Admit: 2019-11-03 | Discharge: 2019-11-03 | Disposition: A | Discharge status: Home / Self Care | Payer: BC Managed Care – PPO | Source: Ambulatory Visit | Attending: Urology | Admitting: Urology

## 2019-11-03 ENCOUNTER — Other Ambulatory Visit: Payer: Self-pay

## 2019-11-03 DIAGNOSIS — C61 Malignant neoplasm of prostate: Secondary | ICD-10-CM | POA: Insufficient documentation

## 2019-11-03 LAB — POCT I-STAT CREATININE: Creatinine, Ser: 1.1 mg/dL (ref 0.61–1.24)

## 2019-11-03 MED ORDER — SODIUM CHLORIDE (PF) 0.9 % IJ SOLN
INTRAMUSCULAR | Status: AC
  Filled 2019-11-03: qty 50

## 2019-11-03 MED ORDER — IOHEXOL 300 MG/ML  SOLN
100.0000 mL | Freq: Once | INTRAMUSCULAR | Status: AC | PRN
  Administered 2019-11-03: 100 mL via INTRAVENOUS

## 2019-11-04 ENCOUNTER — Inpatient Hospital Stay (HOSPITAL_COMMUNITY): Payer: BC Managed Care – PPO | Attending: Hematology | Admitting: Hematology

## 2019-11-04 ENCOUNTER — Other Ambulatory Visit (HOSPITAL_COMMUNITY): Payer: Self-pay | Admitting: Hematology

## 2019-11-04 DIAGNOSIS — Z5111 Encounter for antineoplastic chemotherapy: Secondary | ICD-10-CM | POA: Insufficient documentation

## 2019-11-04 DIAGNOSIS — C779 Secondary and unspecified malignant neoplasm of lymph node, unspecified: Secondary | ICD-10-CM

## 2019-11-04 DIAGNOSIS — Z800 Family history of malignant neoplasm of digestive organs: Secondary | ICD-10-CM | POA: Insufficient documentation

## 2019-11-04 DIAGNOSIS — N1330 Unspecified hydronephrosis: Secondary | ICD-10-CM | POA: Diagnosis not present

## 2019-11-04 DIAGNOSIS — C7951 Secondary malignant neoplasm of bone: Secondary | ICD-10-CM | POA: Diagnosis not present

## 2019-11-04 DIAGNOSIS — Z8042 Family history of malignant neoplasm of prostate: Secondary | ICD-10-CM | POA: Insufficient documentation

## 2019-11-04 DIAGNOSIS — C778 Secondary and unspecified malignant neoplasm of lymph nodes of multiple regions: Secondary | ICD-10-CM | POA: Insufficient documentation

## 2019-11-04 DIAGNOSIS — I700 Atherosclerosis of aorta: Secondary | ICD-10-CM | POA: Insufficient documentation

## 2019-11-04 DIAGNOSIS — Z7189 Other specified counseling: Secondary | ICD-10-CM | POA: Insufficient documentation

## 2019-11-04 DIAGNOSIS — E785 Hyperlipidemia, unspecified: Secondary | ICD-10-CM | POA: Insufficient documentation

## 2019-11-04 DIAGNOSIS — R5383 Other fatigue: Secondary | ICD-10-CM | POA: Insufficient documentation

## 2019-11-04 DIAGNOSIS — Z8249 Family history of ischemic heart disease and other diseases of the circulatory system: Secondary | ICD-10-CM | POA: Insufficient documentation

## 2019-11-04 DIAGNOSIS — M47816 Spondylosis without myelopathy or radiculopathy, lumbar region: Secondary | ICD-10-CM | POA: Insufficient documentation

## 2019-11-04 DIAGNOSIS — M5136 Other intervertebral disc degeneration, lumbar region: Secondary | ICD-10-CM | POA: Insufficient documentation

## 2019-11-04 DIAGNOSIS — Z836 Family history of other diseases of the respiratory system: Secondary | ICD-10-CM | POA: Insufficient documentation

## 2019-11-04 DIAGNOSIS — Z87891 Personal history of nicotine dependence: Secondary | ICD-10-CM | POA: Insufficient documentation

## 2019-11-04 DIAGNOSIS — Z818 Family history of other mental and behavioral disorders: Secondary | ICD-10-CM | POA: Insufficient documentation

## 2019-11-04 DIAGNOSIS — Z79899 Other long term (current) drug therapy: Secondary | ICD-10-CM | POA: Insufficient documentation

## 2019-11-04 DIAGNOSIS — C61 Malignant neoplasm of prostate: Secondary | ICD-10-CM | POA: Diagnosis not present

## 2019-11-04 DIAGNOSIS — Z803 Family history of malignant neoplasm of breast: Secondary | ICD-10-CM | POA: Insufficient documentation

## 2019-11-04 NOTE — Progress Notes (Signed)
Virtual Visit via Telephone Note  I connected with Scott [name] on 11/04/19 at  3:00 PM EDT by telephone and verified that I am speaking with the correct person using two identifiers.   I discussed the limitations, risks, security and privacy concerns of performing an evaluation and management service by telephone and the availability of in person appointments. I also discussed with the patient that there may be a patient responsible charge related to this service. The patient expressed understanding and agreed to proceed.   History of Present Illness: Scott [name] is seen in our clinic for metastatic castration resistant prostate cancer.  He is currently on single agent Lupron.   Observations/Objective: Reports that he had some blood along with urine in the form of clots for the last couple of days.  He was recently evaluated by Dr. Roni Bread who ordered CT scan and bone scan.  He reports slight worsening of the mid to low back pain in the last couple of weeks.  Assessment and Plan:  1.  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  Prominent left hydronephrosis and hydroureter noted.  New sclerotic lesion in the left anterior vertebral body of L1.  Mild left periaortic adenopathy increased from prior, short axis lymph node measuring 1 cm. -Last PSA increased to 15.8 on 09/28/2019. -We discussed various options including abiraterone with prednisone, enzalutamide and chemotherapy with docetaxel.  We have also discussed option of referring him to Dr. Iona Beard at [facility] for possible clinical trials. -As there is rapid progression of disease, I have recommended docetaxel chemotherapy.  He is agreeable after long discussion. -We will plan to proceed with chemotherapy in the next 1 to 2 weeks.  We discussed the side effects.  2.  Left  hydroureteronephrosis: -CT scan showed prostate mass invading the posterior bladder and obstructing the ureter.  Creatinine increased to 1.1. -I will get in touch with Dr. Roni Bread to discuss possibility of debulking tumor and/or stent placement.   Follow Up Instructions: RTC next week for chemotherapy.   I discussed the assessment and treatment plan with the patient. The patient was provided an opportunity to ask questions and all were answered. The patient agreed with the plan and demonstrated an understanding of the instructions.   The patient was advised to call back or seek an in-person evaluation if the symptoms worsen or if the condition fails to improve as anticipated.  I provided 31 minutes of non-face-to-face time during this encounter.   [name], MD

## 2019-11-04 NOTE — Progress Notes (Signed)
START ON PATHWAY REGIMEN - Prostate     A cycle is every 21 days:     Docetaxel      Prednisone   **Always confirm dose/schedule in your pharmacy ordering system**  Patient Characteristics: Adenocarcinoma, Recurrent/New Systemic Disease, Castration Resistant, M1, No Prior Novel Hormonal Agent Histology: Adenocarcinoma Therapeutic Status: Recurrent/New Systemic Disease  Intent of Therapy: Non-Curative / Palliative Intent, Discussed with Patient

## 2019-11-06 ENCOUNTER — Other Ambulatory Visit: Payer: Self-pay

## 2019-11-06 ENCOUNTER — Telehealth (INDEPENDENT_AMBULATORY_CARE_PROVIDER_SITE_OTHER): Payer: BC Managed Care – PPO | Admitting: Urology

## 2019-11-06 ENCOUNTER — Encounter: Payer: Self-pay | Admitting: Urology

## 2019-11-06 ENCOUNTER — Encounter (HOSPITAL_COMMUNITY): Payer: Self-pay

## 2019-11-06 DIAGNOSIS — N135 Crossing vessel and stricture of ureter without hydronephrosis: Secondary | ICD-10-CM

## 2019-11-06 DIAGNOSIS — N403 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: Secondary | ICD-10-CM | POA: Diagnosis not present

## 2019-11-06 DIAGNOSIS — C61 Malignant neoplasm of prostate: Secondary | ICD-10-CM | POA: Diagnosis not present

## 2019-11-06 NOTE — Progress Notes (Signed)
Subjective: Telehealth visit completed by phone.    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 CT with bilateral non-bulky iliac adenopathy and tiny uptake Lt orbit (no spine mets) by bone scan.  His PSA is up to 15.5 from  9.47 at last check and a doubling over the last year. The testosterone remains castrate at <3 on Lupron and dutasteride. He remains on estradiol 0.1mg  patches for the hot flashes.   He had a telehealth visit with Dr. Tammi Klippel for consideration of radiation therapy to the primary and pelvic nodes from the [place] PET findings on 12/11/18 but decided against that.  He is seeing Dr. Delton Coombes as well but is  reluctant to consider Zytiga because of the prednisone.   He has had no recent hematuria.  He has occasional frequency with urgency and UUI.  He wears a pad. He has a reduced stream.  He has nocturia 3-4x but he is drinking a lot of fluids.   His IPSS is 29-30.   Present Management:  01/2017 begin Lupron androgen deprivation Q17mo (declined reccomended orchiectomy)  04/2017 - PSA 2.86;  07/2017 PSA/T 3.44 / T 3 ==> Lupron 45mg .  10/08/17 PSA 2.1.  02/07/18 PSA 2.19/T 4. added Estradiol 0.1mg  patches twice weekly for the hot flashes.  03/10/18 PSA 1.87/T<3.  05/09/18 PSA 2.09/T10/ Est 32.  07/24/18 PSA 6/T 54  08/08/18 Lupron 45mg .  09/08/18 PSA 4.37  10/22/18 PSA 4.64/T<3.  11/26/18 Bone scan negative/ CT C/A/P negative. 12/11/18 [facility] PET small equivocal periaortic and left ext iliac nodes and right prostate activity.  I have discussed that with him.  11/28/18 PSA 6.46. 02/10/19 PSA 7.31/T <3. 03/19/19 PSA 8.20 04/11/19  Lupron 45mg  given.  04/23/19 PSA 9.96. 06/23/19 PSA 9.47. 09/28/19 PSA 15.56, T <3.     Operative cysto / TURP 2018 with inviltrative bladder neck / prostate mass ( left UO purposefully resected).   11/06/19 Scott [name] has a mass at  the base of the bladder consistent with local invasion from his CRCP and he has left ureteral obstruction with a minor increase in his Cr.  He has seen Dr. Delton Coombes and is scheduled to begin chemotherapy but it was felt that decompression of the left kidney was indicated.   He is have increased voiding difficulty with passage of clots as well.    Results for [name], [name] (MRN [id number]) as of 07/10/2019 08:56  Ref. Range 11/28/2018 10:23 02/10/2019 13:25 03/19/2019 13:49 04/23/2019 13:58 06/23/2019 13:41  Prostatic Specific Antigen Latest Ref Range: 0.00 - 4.00 ng/mL 6.46 (H) 7.31 (H) 8.20 (H) 9.96 (H) 9.47 (H)     He has gotten his COVID vaccine.      ROS:  ROS:  A complete review of systems was performed.  All systems are negative except for pertinent findings as noted.   ROS  Allergies  Allergen Reactions  . Wellbutrin [Bupropion]     Hives     Outpatient Encounter Medications as of 11/06/2019  Medication Sig Note  . Barberry-Oreg Grape-Goldenseal (BERBERINE COMPLEX) 200-200-50 MG CAPS Take 2 capsules by mouth daily.   . Cholecalciferol (D 5000) 5000 units capsule Take 20,000 Units by mouth daily.    Marland Kitchen dutasteride (AVODART) 0.5 MG capsule Take 1 capsule (0.5 mg total) by mouth daily.   Marland Kitchen estradiol (VIVELLE-DOT) 0.1 MG/24HR patch Place 1 patch (0.1 mg total)  onto the skin 2 (two) times a week. For management of hot flashes for ADT.   Marland Kitchen Leuprolide Acetate, 6 Month, (LUPRON DEPOT, 71-MONTH,) 45 MG injection Inject 45 mg into the muscle every 6 (six) months.   . Melatonin 10 MG TABS Take 40 tablets by mouth every evening.  07/27/2019: Now taking 40 mg at night.  . Nattokinase 100 MG CAPS Take 1 capsule by mouth in the morning and at bedtime.    Marland Kitchen OVER THE COUNTER MEDICATION Take 3,000 mg by mouth daily. [place] T[employer] Mushroom  Anti-Cancer /Immune Scientist, product/process development   . OVER THE COUNTER MEDICATION daily. CBD oil daily   . Quercetin 250 MG TABS Take 500 mg by mouth 2 (two) times daily.   . TURMERIC  CURCUMIN PO Take 750 mg by mouth daily.   . vitamin B-12 (CYANOCOBALAMIN) 1000 MCG tablet Take 1,000 mcg by mouth daily.    No facility-administered encounter medications on file as of 11/06/2019.    Past Medical History:  Diagnosis Date  . Essential hypertension, benign 01/26/2019  . HLD (hyperlipidemia) 01/26/2019  . Prostate cancer ([place])   . Vitamin D deficiency disease 01/26/2019    Past Surgical History:  Procedure Laterality Date  . HERNIA REPAIR  9833   UMBILICAL   . PROSTATE SURGERY    . TONSILLECTOMY Bilateral   . TRANSURETHRAL RESECTION OF PROSTATE N/A 01/25/2017   Procedure: TRANSURETHRAL RESECTION OF THE PROSTATE (TURP);  Surgeon: Alexis Frock, MD;  Location: [facility];  Service: Urology;  Laterality: N/A;  . WISDOM TOOTH EXTRACTION      Social History   Socioeconomic History  . Marital status: Married    Spouse name: Not on file  . Number of children: 1  . Years of education: Not on file  . Highest education level: Not on file  Occupational History    Comment: working full time  Tobacco Use  . Smoking status: Former Smoker    Packs/day: 1.50    Years: 35.00    Pack years: 52.50    Types: Cigarettes    Quit date: 01/12/2004    Years since quitting: 15.8  . Smokeless tobacco: Never Used  . Tobacco comment: OVER 30 YEARS HX OF SMOKING   Vaping Use  . Vaping Use: Never used  Substance and Sexual Activity  . Alcohol use: No  . Drug use: No  . Sexual activity: Not Currently  Other Topics Concern  . Not on file  Social History Narrative   Married for 19 years.Works for [employer] from home.   Social Determinants of Health   Financial Resource Strain: Low Risk   . Difficulty of Paying Living Expenses: Not hard at all  Food Insecurity: No Food Insecurity  . Worried About Charity fundraiser in the Last Year: Never true  . Ran Out of Food in the Last Year: Never true  Transportation Needs: No Transportation Needs  . Lack of Transportation  (Medical): No  . Lack of Transportation (Non-Medical): No  Physical Activity: Inactive  . Days of Exercise per Week: 0 days  . Minutes of Exercise per Session: 0 min  Stress: No Stress Concern Present  . Feeling of Stress : Not at all  Social Connections: Moderately Integrated  . Frequency of Communication with Friends and Family: Twice a week  . Frequency of Social Gatherings with Friends and Family: Once a week  . Attends Religious Services: Never  . Active Member of Clubs or Organizations: Yes  . Attends Club  or Organization Meetings: Never  . Marital Status: Married  Human resources officer Violence: Not At Risk  . Fear of Current or Ex-Partner: No  . Emotionally Abused: No  . Physically Abused: No  . Sexually Abused: No    Family History  Problem Relation Age of Onset  . Emphysema Mother   . Alzheimer's disease Father   . Prostate cancer Father        dx in his 62s  . Prostate cancer Brother        dx in his 75s  . Breast cancer Maternal Grandmother   . Pancreatic cancer Maternal Grandfather   . Dementia Paternal Grandmother   . Heart attack Paternal Grandfather        Objective: There were no vitals filed for this visit.   Physical ExamI    Assessment & Plan: He has CRCP with locally advanced disease with left ureteral obstruction with AKI and he also has bone and lymph node metastases.     I have discussed options for managing the ureteral obstruction including observation to see the impact of planned chemo,  Percutaneous nephrostomy with antegrade stent attempt or cystoscopy with attempted stenting and possible TUR unroofing of the distal ureter with a channel TURP.    We are going to procedure the the cystoscopic option and I have reviewed the risks in detail.   He has been posted for Tuesday.     I spent 45 minutes reviewed the appropriate records and imaging, discussing the case with Dr. Delton Coombes, reviewing the findings and discussing the options with Mr.  [name] and finally posting the procedure for next Tuesday.     No orders of the defined types were placed in this encounter.    No orders of the defined types were placed in this encounter.     Return for He will need f/u 2-3 weeks postoperatively. .   CC: Doree Albee, MD, Dr. [name].     Irine Seal 11/06/2019

## 2019-11-06 NOTE — H&P (View-Only) (Signed)
Subjective: Telehealth visit completed by phone.    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 CT with bilateral non-bulky iliac adenopathy and tiny uptake Lt orbit (no spine mets) by bone scan.  His PSA is up to 15.5 from  9.47 at last check and a doubling over the last year. The testosterone remains castrate at <3 on Lupron and dutasteride. He remains on estradiol 0.1mg  patches for the hot flashes.   He had a telehealth visit with Dr. Tammi Klippel for consideration of radiation therapy to the primary and pelvic nodes from the [place] PET findings on 12/11/18 but decided against that.  He is seeing Dr. Delton Coombes as well but is  reluctant to consider Zytiga because of the prednisone.   He has had no recent hematuria.  He has occasional frequency with urgency and UUI.  He wears a pad. He has a reduced stream.  He has nocturia 3-4x but he is drinking a lot of fluids.   His IPSS is 29-30.   Present Management:  01/2017 begin Lupron androgen deprivation Q70mo (declined reccomended orchiectomy)  04/2017 - PSA 2.86;  07/2017 PSA/T 3.44 / T 3 ==> Lupron 45mg .  10/08/17 PSA 2.1.  02/07/18 PSA 2.19/T 4. added Estradiol 0.1mg  patches twice weekly for the hot flashes.  03/10/18 PSA 1.87/T<3.  05/09/18 PSA 2.09/T10/ Est 32.  07/24/18 PSA 6/T 54  08/08/18 Lupron 45mg .  09/08/18 PSA 4.37  10/22/18 PSA 4.64/T<3.  11/26/18 Bone scan negative/ CT C/A/P negative. 12/11/18 [facility] PET small equivocal periaortic and left ext iliac nodes and right prostate activity.  I have discussed that with him.  11/28/18 PSA 6.46. 02/10/19 PSA 7.31/T <3. 03/19/19 PSA 8.20 04/11/19  Lupron 45mg  given.  04/23/19 PSA 9.96. 06/23/19 PSA 9.47. 09/28/19 PSA 15.56, T <3.     Operative cysto / TURP 2018 with inviltrative bladder neck / prostate mass ( left UO purposefully resected).   11/06/19 Scott Scott [name] Scott [name] has a mass at  the base of the bladder consistent with local invasion from his CRCP and he has left ureteral obstruction with a minor increase in his Cr.  He has seen Dr. Delton Coombes and is scheduled to begin chemotherapy but it was felt that decompression of the left kidney was indicated.   He is have increased voiding difficulty with passage of clots as well.    Results for Scott Scott [name], Scott [name] (MRN [id number]) as of 07/10/2019 08:56  Ref. Range 11/28/2018 10:23 02/10/2019 13:25 03/19/2019 13:49 04/23/2019 13:58 06/23/2019 13:41  Prostatic Specific Antigen Latest Ref Range: 0.00 - 4.00 ng/mL 6.46 (H) 7.31 (H) 8.20 (H) 9.96 (H) 9.47 (H)     He has gotten his COVID vaccine.      ROS:  ROS:  A complete review of systems was performed.  All systems are negative except for pertinent findings as noted.   ROS  Allergies  Allergen Reactions  . Wellbutrin [Bupropion]     Hives     Outpatient Encounter Medications as of 11/06/2019  Medication Sig Note  . Barberry-Oreg Grape-Goldenseal (BERBERINE COMPLEX) 200-200-50 MG CAPS Take 2 capsules by mouth daily.   . Cholecalciferol (D 5000) 5000 units capsule Take 20,000 Units by mouth daily.    Marland Kitchen dutasteride (AVODART) 0.5 MG capsule Take 1 capsule (0.5 mg total) by mouth daily.   Marland Kitchen estradiol (VIVELLE-DOT) 0.1 MG/24HR patch Place 1 patch (0.1 mg total)  onto the skin 2 (two) times a week. For management of hot flashes for ADT.   Marland Kitchen Leuprolide Acetate, 6 Month, (LUPRON DEPOT, 47-MONTH,) 45 MG injection Inject 45 mg into the muscle every 6 (six) months.   . Melatonin 10 MG TABS Take 40 tablets by mouth every evening.  07/27/2019: Now taking 40 mg at night.  . Nattokinase 100 MG CAPS Take 1 capsule by mouth in the morning and at bedtime.    Marland Kitchen OVER THE COUNTER MEDICATION Take 3,000 mg by mouth daily. [place] Tail Mushroom  Anti-Cancer /Immune Scientist, product/process development   . OVER THE COUNTER MEDICATION daily. CBD oil daily   . Quercetin 250 MG TABS Take 500 mg by mouth 2 (two) times daily.   . TURMERIC  CURCUMIN PO Take 750 mg by mouth daily.   . vitamin B-12 (CYANOCOBALAMIN) 1000 MCG tablet Take 1,000 mcg by mouth daily.    No facility-administered encounter medications on file as of 11/06/2019.    Past Medical History:  Diagnosis Date  . Essential hypertension, benign 01/26/2019  . HLD (hyperlipidemia) 01/26/2019  . Prostate cancer ([place])   . Vitamin D deficiency disease 01/26/2019    Past Surgical History:  Procedure Laterality Date  . HERNIA REPAIR  0626   UMBILICAL   . PROSTATE SURGERY    . TONSILLECTOMY Bilateral   . TRANSURETHRAL RESECTION OF PROSTATE N/A 01/25/2017   Procedure: TRANSURETHRAL RESECTION OF THE PROSTATE (TURP);  Surgeon: Alexis Frock, MD;  Location: [facility];  Service: Urology;  Laterality: N/A;  . WISDOM TOOTH EXTRACTION      Social History   Socioeconomic History  . Marital status: Married    Spouse name: Not on file  . Number of children: 1  . Years of education: Not on file  . Highest education level: Not on file  Occupational History    Comment: working full time  Tobacco Use  . Smoking status: Former Smoker    Packs/day: 1.50    Years: 35.00    Pack years: 52.50    Types: Cigarettes    Quit date: 01/12/2004    Years since quitting: 15.8  . Smokeless tobacco: Never Used  . Tobacco comment: OVER 30 YEARS HX OF SMOKING   Vaping Use  . Vaping Use: Never used  Substance and Sexual Activity  . Alcohol use: No  . Drug use: No  . Sexual activity: Not Currently  Other Topics Concern  . Not on file  Social History Narrative   Married for 19 years.Works for [employer] from home.   Social Determinants of Health   Financial Resource Strain: Low Risk   . Difficulty of Paying Living Expenses: Not hard at all  Food Insecurity: No Food Insecurity  . Worried About Charity fundraiser in the Last Year: Never true  . Ran Out of Food in the Last Year: Never true  Transportation Needs: No Transportation Needs  . Lack of Transportation  (Medical): No  . Lack of Transportation (Non-Medical): No  Physical Activity: Inactive  . Days of Exercise per Week: 0 days  . Minutes of Exercise per Session: 0 min  Stress: No Stress Concern Present  . Feeling of Stress : Not at all  Social Connections: Moderately Integrated  . Frequency of Communication with Friends and Family: Twice a week  . Frequency of Social Gatherings with Friends and Family: Once a week  . Attends Religious Services: Never  . Active Member of Clubs or Organizations: Yes  . Attends Club  or Organization Meetings: Never  . Marital Status: Married  Human resources officer Violence: Not At Risk  . Fear of Current or Ex-Partner: No  . Emotionally Abused: No  . Physically Abused: No  . Sexually Abused: No    Family History  Problem Relation Age of Onset  . Emphysema Mother   . Alzheimer's disease Father   . Prostate cancer Father        dx in his 58s  . Prostate cancer Brother        dx in his 57s  . Breast cancer Maternal Grandmother   . Pancreatic cancer Maternal Grandfather   . Dementia Paternal Grandmother   . Heart attack Paternal Grandfather        Objective: There were no vitals filed for this visit.   Physical ExamI    Assessment & Plan: He has CRCP with locally advanced disease with left ureteral obstruction with AKI and he also has bone and lymph node metastases.     I have discussed options for managing the ureteral obstruction including observation to see the impact of planned chemo,  Percutaneous nephrostomy with antegrade stent attempt or cystoscopy with attempted stenting and possible TUR unroofing of the distal ureter with a channel TURP.    We are going to procedure the the cystoscopic option and I have reviewed the risks in detail.   He has been posted for Tuesday.     I spent 45 minutes reviewed the appropriate records and imaging, discussing the case with Dr. Delton Coombes, reviewing the findings and discussing the options with Mr.  Scott [name] and finally posting the procedure for next Tuesday.     No orders of the defined types were placed in this encounter.    No orders of the defined types were placed in this encounter.     Return for He will need f/u 2-3 weeks postoperatively. .   CC: Doree Albee, MD, Dr. [name].     Irine Seal 11/06/2019

## 2019-11-06 NOTE — Progress Notes (Signed)
I called and spoke with the patient today regarding what to expect during his first day of treament. The patient was given the opportunity to ask questions and all were answered to his satisfaction. Docetaxel information was provided to the patient via e-mail. I have encouraged the patient to call with any questions or concerns.

## 2019-11-09 ENCOUNTER — Other Ambulatory Visit (HOSPITAL_COMMUNITY)
Admission: RE | Admit: 2019-11-09 | Discharge: 2019-11-09 | Disposition: A | Discharge status: Home / Self Care | Payer: BC Managed Care – PPO | Source: Ambulatory Visit | Attending: Urology | Admitting: Urology

## 2019-11-09 ENCOUNTER — Other Ambulatory Visit: Payer: Self-pay | Admitting: Urology

## 2019-11-09 ENCOUNTER — Encounter (HOSPITAL_COMMUNITY): Payer: Self-pay | Admitting: Urology

## 2019-11-09 ENCOUNTER — Other Ambulatory Visit: Payer: Self-pay

## 2019-11-09 DIAGNOSIS — Z01812 Encounter for preprocedural laboratory examination: Secondary | ICD-10-CM | POA: Diagnosis not present

## 2019-11-09 DIAGNOSIS — Z20822 Contact with and (suspected) exposure to covid-19: Secondary | ICD-10-CM | POA: Insufficient documentation

## 2019-11-09 LAB — SARS CORONAVIRUS 2 (TAT 6-24 HRS): SARS Coronavirus 2: NEGATIVE

## 2019-11-09 NOTE — Progress Notes (Addendum)
COVID Vaccine Completed:Yes Date COVID Vaccine completed: 06/2019 COVID vaccine manufacturer:    Moderna     PCP - Dr. Delane Ginger. [place] Cardiologist - N/A  Chest x-ray - N/A EKG -N/A  Stress Test - greater than 2 years ECHO - N/A Cardiac Cath - N/A  Sleep Study - N/A CPAP - N/A  Fasting Blood Sugar - N/A Checks Blood Sugar __N/A___ times a day  Blood Thinner Instructions:N/A Aspirin Instructions:N/A Last Dose:N/A  Anesthesia review: N/A  Patient denies shortness of breath, fever, cough and chest pain at PAT appointment   Patient verbalized understanding of instructions that were given to them at the PAT appointment. Patient was also instructed that they will need to review over the PAT instructions again at home before surgery.

## 2019-11-10 ENCOUNTER — Inpatient Hospital Stay (HOSPITAL_COMMUNITY): Payer: BC Managed Care – PPO

## 2019-11-10 ENCOUNTER — Other Ambulatory Visit: Payer: Self-pay

## 2019-11-10 ENCOUNTER — Encounter (HOSPITAL_COMMUNITY): Payer: Self-pay | Admitting: Urology

## 2019-11-10 ENCOUNTER — Ambulatory Visit (HOSPITAL_COMMUNITY)
Admission: RE | Admit: 2019-11-10 | Discharge: 2019-11-10 | Disposition: A | Discharge status: Home / Self Care | Payer: BC Managed Care – PPO | Attending: Urology | Admitting: Urology

## 2019-11-10 ENCOUNTER — Other Ambulatory Visit (HOSPITAL_COMMUNITY): Payer: BC Managed Care – PPO | Admitting: General Practice

## 2019-11-10 ENCOUNTER — Inpatient Hospital Stay (HOSPITAL_COMMUNITY): Payer: BC Managed Care – PPO | Admitting: Anesthesiology

## 2019-11-10 ENCOUNTER — Encounter (HOSPITAL_COMMUNITY)
Admission: RE | Disposition: A | Discharge status: Home / Self Care | Payer: Self-pay | Source: Home / Self Care | Attending: Urology

## 2019-11-10 ENCOUNTER — Ambulatory Visit (HOSPITAL_COMMUNITY): Payer: BC Managed Care – PPO | Admitting: Hematology

## 2019-11-10 ENCOUNTER — Ambulatory Visit (HOSPITAL_COMMUNITY): Payer: BC Managed Care – PPO

## 2019-11-10 DIAGNOSIS — Z888 Allergy status to other drugs, medicaments and biological substances status: Secondary | ICD-10-CM | POA: Diagnosis not present

## 2019-11-10 DIAGNOSIS — N320 Bladder-neck obstruction: Secondary | ICD-10-CM | POA: Diagnosis not present

## 2019-11-10 DIAGNOSIS — R9721 Rising PSA following treatment for malignant neoplasm of prostate: Secondary | ICD-10-CM | POA: Diagnosis present

## 2019-11-10 DIAGNOSIS — Z79890 Hormone replacement therapy (postmenopausal): Secondary | ICD-10-CM | POA: Insufficient documentation

## 2019-11-10 DIAGNOSIS — C779 Secondary and unspecified malignant neoplasm of lymph node, unspecified: Secondary | ICD-10-CM | POA: Insufficient documentation

## 2019-11-10 DIAGNOSIS — C61 Malignant neoplasm of prostate: Secondary | ICD-10-CM | POA: Diagnosis not present

## 2019-11-10 DIAGNOSIS — Z87891 Personal history of nicotine dependence: Secondary | ICD-10-CM | POA: Insufficient documentation

## 2019-11-10 DIAGNOSIS — C7951 Secondary malignant neoplasm of bone: Secondary | ICD-10-CM | POA: Insufficient documentation

## 2019-11-10 DIAGNOSIS — Z79899 Other long term (current) drug therapy: Secondary | ICD-10-CM | POA: Diagnosis not present

## 2019-11-10 DIAGNOSIS — E559 Vitamin D deficiency, unspecified: Secondary | ICD-10-CM | POA: Diagnosis not present

## 2019-11-10 DIAGNOSIS — N135 Crossing vessel and stricture of ureter without hydronephrosis: Secondary | ICD-10-CM | POA: Diagnosis present

## 2019-11-10 DIAGNOSIS — I10 Essential (primary) hypertension: Secondary | ICD-10-CM | POA: Diagnosis not present

## 2019-11-10 HISTORY — PX: CYSTOSCOPY W/ URETERAL STENT PLACEMENT: SHX1429

## 2019-11-10 HISTORY — DX: Hypothyroidism, unspecified: E03.9

## 2019-11-10 HISTORY — PX: TRANSURETHRAL RESECTION OF PROSTATE: SHX73

## 2019-11-10 HISTORY — DX: Bladder-neck obstruction: N32.0

## 2019-11-10 HISTORY — DX: Dyspnea, unspecified: R06.00

## 2019-11-10 HISTORY — DX: Personal history of other malignant neoplasm of skin: Z85.828

## 2019-11-10 LAB — CBC
HCT: 38.1 % — ABNORMAL LOW (ref 39.0–52.0)
Hemoglobin: 13.6 g/dL (ref 13.0–17.0)
MCH: 31.8 pg (ref 26.0–34.0)
MCHC: 35.7 g/dL (ref 30.0–36.0)
MCV: 89 fL (ref 80.0–100.0)
Platelets: 251 10*3/uL (ref 150–400)
RBC: 4.28 MIL/uL (ref 4.22–5.81)
RDW: 12 % (ref 11.5–15.5)
WBC: 5.8 10*3/uL (ref 4.0–10.5)
nRBC: 0 % (ref 0.0–0.2)

## 2019-11-10 SURGERY — CYSTOSCOPY, WITH RETROGRADE PYELOGRAM AND URETERAL STENT INSERTION
Anesthesia: General

## 2019-11-10 MED ORDER — OXYCODONE HCL 5 MG PO TABS
5.0000 mg | ORAL_TABLET | Freq: Once | ORAL | Status: AC | PRN
  Administered 2019-11-10: 5 mg via ORAL

## 2019-11-10 MED ORDER — SODIUM CHLORIDE 0.9% FLUSH: 3.0000 mL | Freq: Two times a day (BID) | INTRAVENOUS | Status: DC

## 2019-11-10 MED ORDER — LIDOCAINE HCL (CARDIAC) PF 100 MG/5ML IV SOSY
PREFILLED_SYRINGE | INTRAVENOUS | Status: DC | PRN
  Administered 2019-11-10: 60 mg via INTRAVENOUS

## 2019-11-10 MED ORDER — OXYCODONE HCL 5 MG/5ML PO SOLN: 5.0000 mg | Freq: Once | ORAL | Status: AC | PRN

## 2019-11-10 MED ORDER — SODIUM CHLORIDE 0.9 % IR SOLN
Status: DC | PRN
  Administered 2019-11-10: 12000 mL

## 2019-11-10 MED ORDER — ACETAMINOPHEN 325 MG PO TABS: 650.0000 mg | ORAL_TABLET | ORAL | Status: DC | PRN

## 2019-11-10 MED ORDER — MIDAZOLAM HCL 2 MG/2ML IJ SOLN
INTRAMUSCULAR | Status: AC
  Filled 2019-11-10: qty 2

## 2019-11-10 MED ORDER — CEFAZOLIN SODIUM-DEXTROSE 2-4 GM/100ML-% IV SOLN
2.0000 g | INTRAVENOUS | Status: AC
  Administered 2019-11-10: 2 g via INTRAVENOUS
  Filled 2019-11-10: qty 100

## 2019-11-10 MED ORDER — CHLORHEXIDINE GLUCONATE 0.12 % MT SOLN
15.0000 mL | Freq: Once | OROMUCOSAL | Status: AC
  Administered 2019-11-10: 15 mL via OROMUCOSAL

## 2019-11-10 MED ORDER — ONDANSETRON HCL 4 MG/2ML IJ SOLN: 4.0000 mg | Freq: Once | INTRAMUSCULAR | Status: DC | PRN

## 2019-11-10 MED ORDER — ONDANSETRON HCL 4 MG/2ML IJ SOLN
INTRAMUSCULAR | Status: DC | PRN
  Administered 2019-11-10: 4 mg via INTRAVENOUS

## 2019-11-10 MED ORDER — PROPOFOL 10 MG/ML IV BOLUS
INTRAVENOUS | Status: AC
  Filled 2019-11-10: qty 20

## 2019-11-10 MED ORDER — SODIUM CHLORIDE 0.9% FLUSH: 3.0000 mL | INTRAVENOUS | Status: DC | PRN

## 2019-11-10 MED ORDER — HYDROCODONE-ACETAMINOPHEN 5-325 MG PO TABS: 1.0000 | ORAL_TABLET | ORAL | 0 refills | Status: DC | PRN

## 2019-11-10 MED ORDER — GLYCOPYRROLATE 0.2 MG/ML IJ SOLN
INTRAMUSCULAR | Status: DC | PRN
  Administered 2019-11-10: .2 mg via INTRAVENOUS

## 2019-11-10 MED ORDER — SODIUM CHLORIDE 0.9 % IV SOLN: 250.0000 mL | INTRAVENOUS | Status: DC | PRN

## 2019-11-10 MED ORDER — FENTANYL CITRATE (PF) 100 MCG/2ML IJ SOLN
INTRAMUSCULAR | Status: DC | PRN
Start: 2019-11-10 — End: 2019-11-10
  Administered 2019-11-10 (×4): 50 ug via INTRAVENOUS

## 2019-11-10 MED ORDER — OXYCODONE HCL 5 MG PO TABS
ORAL_TABLET | ORAL | Status: AC
  Filled 2019-11-10: qty 1

## 2019-11-10 MED ORDER — MIDAZOLAM HCL 5 MG/5ML IJ SOLN
INTRAMUSCULAR | Status: DC | PRN
  Administered 2019-11-10: 2 mg via INTRAVENOUS

## 2019-11-10 MED ORDER — ORAL CARE MOUTH RINSE: 15.0000 mL | Freq: Once | OROMUCOSAL | Status: AC

## 2019-11-10 MED ORDER — OXYCODONE HCL 5 MG PO TABS: 5.0000 mg | ORAL_TABLET | ORAL | Status: DC | PRN

## 2019-11-10 MED ORDER — ACETAMINOPHEN 650 MG RE SUPP
650.0000 mg | RECTAL | Status: DC | PRN
  Filled 2019-11-10: qty 1

## 2019-11-10 MED ORDER — DEXAMETHASONE SODIUM PHOSPHATE 4 MG/ML IJ SOLN
INTRAMUSCULAR | Status: DC | PRN
  Administered 2019-11-10: 5 mg via INTRAVENOUS

## 2019-11-10 MED ORDER — LACTATED RINGERS IV SOLN: INTRAVENOUS | Status: DC

## 2019-11-10 MED ORDER — PROPOFOL 10 MG/ML IV BOLUS
INTRAVENOUS | Status: DC | PRN
  Administered 2019-11-10: 200 mg via INTRAVENOUS

## 2019-11-10 MED ORDER — MORPHINE SULFATE (PF) 4 MG/ML IV SOLN: 2.0000 mg | INTRAVENOUS | Status: DC | PRN

## 2019-11-10 MED ORDER — FENTANYL CITRATE (PF) 100 MCG/2ML IJ SOLN: 25.0000 ug | INTRAMUSCULAR | Status: DC | PRN

## 2019-11-10 MED ORDER — FENTANYL CITRATE (PF) 250 MCG/5ML IJ SOLN
INTRAMUSCULAR | Status: AC
  Filled 2019-11-10: qty 5

## 2019-11-10 SURGICAL SUPPLY — 22 items
BAG DRN RND TRDRP ANRFLXCHMBR (UROLOGICAL SUPPLIES) ×2
BAG URINE DRAIN 2000ML AR STRL (UROLOGICAL SUPPLIES) ×4 IMPLANT
BAG URO CATCHER STRL LF (MISCELLANEOUS) ×4 IMPLANT
CATH FOLEY 3WAY 30CC 22FR (CATHETERS) ×4 IMPLANT
CATH URET 5FR 28IN OPEN ENDED (CATHETERS) ×4 IMPLANT
CLOTH BEACON ORANGE TIMEOUT ST (SAFETY) ×4 IMPLANT
ELECT REM PT RETURN 15FT ADLT (MISCELLANEOUS) ×4 IMPLANT
GLOVE SURG SS PI 8.0 STRL IVOR (GLOVE) ×4 IMPLANT
GOWN STRL REUS W/TWL XL LVL3 (GOWN DISPOSABLE) ×4 IMPLANT
GUIDEWIRE STR DUAL SENSOR (WIRE) ×4 IMPLANT
HOLDER FOLEY CATH W/STRAP (MISCELLANEOUS) ×4 IMPLANT
KIT TURNOVER KIT A (KITS) IMPLANT
LOOP CUT BIPOLAR 24F LRG (ELECTROSURGICAL) ×4 IMPLANT
MANIFOLD NEPTUNE II (INSTRUMENTS) ×4 IMPLANT
PACK CYSTO (CUSTOM PROCEDURE TRAY) ×4 IMPLANT
PLUG CATH AND CAP STER (CATHETERS) ×4 IMPLANT
SYR 30ML LL (SYRINGE) IMPLANT
SYR TOOMEY IRRIG 70ML (MISCELLANEOUS) ×4
SYRINGE TOOMEY IRRIG 70ML (MISCELLANEOUS) ×2 IMPLANT
TUBING CONNECTING 10 (TUBING) ×3 IMPLANT
TUBING CONNECTING 10' (TUBING) ×1
TUBING UROLOGY SET (TUBING) ×4 IMPLANT

## 2019-11-10 NOTE — Discharge Instructions (Signed)
Transurethral Resection of Bladder Tumor, Care After This sheet gives you information about how to care for yourself after your procedure. Your health care provider may also give you more specific instructions. If you have problems or questions, contact your health care provider. What can I expect after the procedure? After the procedure, it is common to have:  A small amount of blood in your urine for up to 2 weeks.  Soreness or mild pain from your catheter. After your catheter is removed, you may have mild soreness, especially when urinating.  Pain in your lower abdomen. Follow these instructions at home: Medicines   Take over-the-counter and prescription medicines only as told by your health care provider.  If you were prescribed an antibiotic medicine, take it as told by your health care provider. Do not stop taking the antibiotic even if you start to feel better.  Do not drive for 24 hours if you were given a sedative during your procedure.  Ask your health care provider if the medicine prescribed to you: ? Requires you to avoid driving or using heavy machinery. ? Can cause constipation. You may need to take these actions to prevent or treat constipation:  Take over-the-counter or prescription medicines.  Eat foods that are high in fiber, such as beans, whole grains, and fresh fruits and vegetables.  Limit foods that are high in fat and processed sugars, such as fried or sweet foods. Activity  Return to your normal activities as told by your health care provider. Ask your health care provider what activities are safe for you.  Do not lift anything that is heavier than 10 lb (4.5 kg), or the limit that you are told, until your health care provider says that it is safe.  Avoid intense physical activity for as long as told by your health care provider.  Rest as told by your health care provider.  Avoid sitting for a long time without moving. Get up to take short walks every  1-2 hours. This is important to improve blood flow and breathing. Ask for help if you feel weak or unsteady. General instructions   Do not drink alcohol for as long as told by your health care provider. This is especially important if you are taking prescription pain medicines.  Do not take baths, swim, or use a hot tub until your health care provider approves. Ask your health care provider if you may take showers. You may only be allowed to take sponge baths.  If you have a catheter, follow instructions from your health care provider about caring for your catheter and your drainage bag.  Drink enough fluid to keep your urine pale yellow.  Wear compression stockings as told by your health care provider. These stockings help to prevent blood clots and reduce swelling in your legs.  Keep all follow-up visits as told by your health care provider. This is important. ? You will need to be followed closely with regular checks of your bladder and urethra (cystoscopies) to make sure that the cancer does not come back. Contact a health care provider if:  You have pain that gets worse or does not improve with medicine.  You have blood in your urine for more than 2 weeks.  You have cloudy or bad-smelling urine.  You become constipated. Signs of constipation may include having: ? Fewer than three bowel movements in a week. ? Difficulty having a bowel movement. ? Stools that are dry, hard, or larger than normal.  You have  a fever. Get help right away if:  You have: ? Severe pain. ? Bright red blood in your urine. ? Blood clots in your urine. ? A lot of blood in your urine.  Your catheter has been removed and you are not able to urinate.  You have a catheter in place and the catheter is not draining urine. Summary  After your procedure, it is common to have a small amount of blood in your urine, soreness or mild pain from your catheter, and pain in your lower abdomen.  Take  over-the-counter and prescription medicines only as told by your health care provider.  Rest as told by your health care provider. Follow your health care provider's instructions about returning to normal activities. Ask what activities are safe for you.  If you have a catheter, follow instructions from your health care provider about caring for your catheter and your drainage bag.  Get help right away if you cannot urinate, you have severe pain, or you have bright red blood or blood clots in your urine.  You may remove the catheter in the morning if the urine is clear or light pink.  If it is bloody, please leave it for another day.    Don't take Ibuprofen for 1 week.   You may use tylenol.   This information is not intended to replace advice given to you by your health care provider. Make sure you discuss any questions you have with your health care provider. Document Revised: 10/03/2017 Document Reviewed: 10/03/2017 Elsevier Patient Education  [place].

## 2019-11-10 NOTE — Op Note (Signed)
Procedure: Cystoscopy with transurethral resection of a malignant neoplasm of the bladder of overlapping sites of prostatic origin.  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   Postop diagnosis: Same.  Surgeon: Dr. Irine Seal.  Anesthesia: General.  Specimen: Tumor chips.  Drains: 22 French three-way Foley catheter.  EBL: 10 mL.  Complications: None.  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  He is to undergo chemotherapy for management of his cancer and it was felt that a channel resection of the prostate with resection of the bladder mass and unroofing of the left ureter were indicated in preparation for that treatment.  Procedure: He was given 2 g of Ancef.  A general anesthetic was induced.  He was placed in the lithotomy position and fitted with PAS hose.  His perineum and genitalia were prepped with Betadine solution he was draped in usual sterile fashion.  Cystoscopy was performed using a 23 [place] scope and 30 lens.  Examination revealed a normal urethra.  The external sphincter was intact.  The prostatic urethra had evidence of prior resection with some regrowth on the right and an adhesion between the right lobe and the floor the prostate at the apex.  At the bladder neck there was extensive nodular neoplasm that extended out onto the trigone and circumferentially around the bladder neck with more involvement on the right than the left and with some anterior involvement as well.  The ureteral orifices were not identifiable.  The bladder wall had mild to moderate trabeculation but no additional lesions beyond the bladder neck area.  The urethra was then calibrated to 30 [place] with [employer] sounds and the 26 French continuous-flow  resectoscope sheath with a visual obturator and 30 lens was passed without difficulty.  The sheath was then fitted with an Beatrix Fetters handle with a bipolar loop and the 30 lens.  Saline was used as an irrigant.  The initial resection was performed of the bulky mass at the posterior bladder neck.  I did my best to resect down the muscle and did resect across the left ureteral orifice.  The lumen was widely patent, approximately centimeter across so it was felt the ureteral stenting was not indicated.  Also resected across the right ureteral orifice which was readily apparent and also widely patent so once again it was not felt that a ureteral stent was indicated.  I then resected additional tumor from the right and left lateral walls of the bladder but was unable to resect the anterior component due to angulation and fixation of the prostate.  The diameter of the lesion did exceed 5 cm.  I then resected the right lobe of the prostate sufficient to relieve obstruction.  The chips were evacuated from the bladder and final hemostasis was performed.  Final inspection revealed both ureteral orifice ease effluxing clear urine.  No retained chips or active bleeding were noted.  Upon removal of the scope pressure on the bladder produced an excellent stream.  A 22 French three-way Foley catheter was placed the aid of a catheter guide.  The irrigation port was plugged and the drainage port was placed to straight drainage.  He was taken down from lithotomy position, his anesthetic was reversed and he was moved to recovery room in stable condition.  There were no complications.

## 2019-11-10 NOTE — Anesthesia Preprocedure Evaluation (Signed)
Anesthesia Evaluation  Patient identified by MRN, date of birth, ID band Patient awake    Reviewed: Allergy & Precautions, NPO status , Patient's Chart, lab work & pertinent test results  Airway Mallampati: II  TM Distance: >3 FB Neck ROM: Full    Dental  (+) Teeth Intact, Dental Advisory Given   Pulmonary former smoker,    breath sounds clear to auscultation       Cardiovascular hypertension,  Rhythm:Regular Rate:Normal     Neuro/Psych    GI/Hepatic   Endo/Other    Renal/GU      Musculoskeletal   Abdominal   Peds  Hematology   Anesthesia Other Findings   Reproductive/Obstetrics                             Anesthesia Physical Anesthesia Plan  ASA: III  Anesthesia Plan: General   Post-op Pain Management:    Induction: Intravenous  PONV Risk Score and Plan: Ondansetron  Airway Management Planned: LMA  Additional Equipment:   Intra-op Plan:   Post-operative Plan:   Informed Consent: I have reviewed the patients History and Physical, chart, labs and discussed the procedure including the risks, benefits and alternatives for the proposed anesthesia with the patient or authorized representative who has indicated his/her understanding and acceptance.     Dental advisory given  Plan Discussed with: CRNA and Anesthesiologist  Anesthesia Plan Comments:         Anesthesia Quick Evaluation

## 2019-11-10 NOTE — Transfer of Care (Signed)
Immediate Anesthesia Transfer of Care Note  Patient: Scott [name]  Procedure(s) Performed: Procedure(s): CYSTOSCOPY (Left) TRANSURETHRAL RESECTION OF THE PROSTATE (TURP) (N/A)  Patient Location: PACU  Anesthesia Type:General  Level of Consciousness:  sedated, patient cooperative and responds to stimulation  Airway & Oxygen Therapy:Patient Spontanous Breathing and Patient connected to face mask oxgen  Post-op Assessment:  Report given to PACU RN and Post -op Vital signs reviewed and stable  Post vital signs:  Reviewed and stable  Last Vitals:  Vitals:   11/10/19 1020 11/10/19 1305  BP: (!) 186/92 (!) 147/88  Pulse: 60 (!) 55  Resp: 18 10  Temp: 36.9 C 36.6 C  SpO2: 753% 005%    Complications: No apparent anesthesia complications

## 2019-11-10 NOTE — Anesthesia Procedure Notes (Signed)
Procedure Name: LMA Insertion Date/Time: 11/10/2019 12:25 PM Performed by: Lavina Hamman, CRNA Pre-anesthesia Checklist: Patient identified, Emergency Drugs available, Suction available and Patient being monitored Patient Re-evaluated:Patient Re-evaluated prior to induction Oxygen Delivery Method: Circle System Utilized Preoxygenation: Pre-oxygenation with 100% oxygen Induction Type: IV induction Ventilation: Mask ventilation without difficulty LMA: LMA inserted LMA Size: 4.0 Number of attempts: 1 Airway Equipment and Method: Bite block Placement Confirmation: positive ETCO2 Tube secured with: Tape Dental Injury: Teeth and Oropharynx as per pre-operative assessment

## 2019-11-10 NOTE — Anesthesia Postprocedure Evaluation (Signed)
Anesthesia Post Note  Patient: Scott [name]  Procedure(s) Performed: CYSTOSCOPY (Left ) TRANSURETHRAL RESECTION OF THE PROSTATE (TURP) (N/A )     Patient location during evaluation: PACU Anesthesia Type: General Level of consciousness: awake and alert Pain management: pain level controlled Vital Signs Assessment: post-procedure vital signs reviewed and stable Respiratory status: spontaneous breathing, nonlabored ventilation and respiratory function stable Cardiovascular status: blood pressure returned to baseline and stable Postop Assessment: no apparent nausea or vomiting Anesthetic complications: no   No complications documented.  Last Vitals:  Vitals:   11/10/19 1330 11/10/19 1345  BP: (!) 157/85 (!) 160/84  Pulse: (!) 51 (!) 52  Resp: 12 16  Temp: 36.6 C 36.5 C  SpO2: 93% 98%    Last Pain:  Vitals:   11/10/19 1400  TempSrc:   PainSc: [place]

## 2019-11-10 NOTE — Interval H&P Note (Signed)
History and Physical Interval Note:  11/10/2019 10:58 AM  Scott [name]  has presented today for surgery, with the diagnosis of LEFT URETERAL OBSTRUCTION SECONDARY TO PROSTATE CANCER.  The various methods of treatment have been discussed with the patient and family. After consideration of risks, benefits and other options for treatment, the patient has consented to  Procedure(s): CYSTOSCOPY WITH RETROGRADE PYELOGRAM/URETERAL STENT PLACEMENT (Left) TRANSURETHRAL RESECTION OF THE PROSTATE (TURP) (N/A) as a surgical intervention.  The patient's history has been reviewed, patient examined, no change in status, stable for surgery.  I have reviewed the patient's chart and labs.  Questions were answered to the patient's satisfaction.     Irine Seal

## 2019-11-11 ENCOUNTER — Other Ambulatory Visit (HOSPITAL_COMMUNITY): Payer: BC Managed Care – PPO

## 2019-11-11 ENCOUNTER — Encounter (HOSPITAL_COMMUNITY): Payer: Self-pay | Admitting: Urology

## 2019-11-11 DIAGNOSIS — N320 Bladder-neck obstruction: Secondary | ICD-10-CM | POA: Diagnosis present

## 2019-11-11 DIAGNOSIS — N135 Crossing vessel and stricture of ureter without hydronephrosis: Secondary | ICD-10-CM | POA: Diagnosis present

## 2019-11-11 NOTE — Discharge Summary (Signed)
Physician Discharge Summary  Patient ID: Scott [name] MRN: [id number] DOB/AGE: 1955/07/14 65 y.o.  Admit date: 11/10/2019 Discharge date: 11/11/2019  Admission Diagnoses:  Ureteral obstruction, left  Discharge Diagnoses:  Principal Problem:   Ureteral obstruction, left Active Problems:   Prostate cancer metastatic to multiple sites [facility])   Bladder outlet obstruction   Past Medical History:  Diagnosis Date  . Bladder obstruction   . Dyspnea   . Essential hypertension, benign 01/26/2019   at [place] office elevated, normal at home, no medications at this time  . History of basal cell cancer   . HLD (hyperlipidemia) 01/26/2019  . Hypothyroidism    history of, not currently taking medication  . Prostate cancer ([place])   . Vitamin D deficiency disease 01/26/2019    Surgeries: Procedure(s): CYSTOSCOPY TRANSURETHRAL RESECTION OF THE PROSTATE (TURP) on 11/10/2019   Consultants (if any):   Discharged Condition: Improved  Hospital Course: Scott [name] is an 64 y.o. male who was admitted 11/10/2019 with a diagnosis of Ureteral obstruction, left and went to the operating room on 11/10/2019 and underwent the above named procedures.  The left ureter was successfully unobstructed and the bladder outlet obstruction was relieved.  He was discharged with the foley in place.   He was given perioperative antibiotics:  Anti-infectives (From admission, onward)   Start     Dose/Rate Route Frequency Ordered Stop   11/10/19 1001  ceFAZolin (ANCEF) IVPB 2g/100 mL premix        2 g 200 mL/hr over 30 Minutes Intravenous 30 min pre-op 11/10/19 1001 11/10/19 1220    .  He was given sequential compression devices for DVT prophylaxis.  He benefited maximally from the hospital stay and there were no complications.    Recent vital signs:  Vitals:   11/10/19 1345 11/10/19 1440  BP: (!) 160/84 (!) 154/85  Pulse: (!) 52 60  Resp: 16 18  Temp: 97.7 F (36.5 C) 97.6 F (36.4 C)  SpO2: 98% 100%     Recent laboratory studies:  Lab Results  Component Value Date   HGB 13.6 11/10/2019   HGB 13.5 09/28/2019   HGB 13.4 06/23/2019   Lab Results  Component Value Date   WBC 5.8 11/10/2019   PLT 251 11/10/2019   No results found for: INR Lab Results  Component Value Date   NA 137 09/28/2019   K 4.2 09/28/2019   CL 105 09/28/2019   CO2 22 09/28/2019   BUN 15 09/28/2019   CREATININE 1.10 11/03/2019   GLUCOSE 101 (H) 09/28/2019    Discharge Medications:   Allergies as of 11/10/2019      Reactions   Wellbutrin [bupropion] Hives      Medication List    TAKE these medications   acetaminophen 500 MG tablet Commonly known as: TYLENOL Take 1,000 mg by mouth at bedtime.   ASTRAGALUS PO Take 1 tablet by mouth 2 (two) times daily.   BOSWELLIA PO Take 1 capsule by mouth 2 (two) times daily.   cimetidine 200 MG tablet Commonly known as: TAGAMET Take 200-400 mg by mouth daily as needed (inflammation).   D 5000 125 MCG (5000 UT) capsule Generic drug: Cholecalciferol Take 5,000-10,000 Units by mouth See admin instructions. Take 10000 units in the morning and 5000 units at night   dutasteride 0.5 MG capsule Commonly known as: AVODART Take 1 capsule (0.5 mg total) by mouth daily.   estradiol 0.1 MG/24HR patch Commonly known as: VIVELLE-DOT Place 1 patch (0.1 mg total) onto the  skin 2 (two) times a week. For management of hot flashes for ADT. What changed:   when to take this  additional instructions   HYDROcodone-acetaminophen 5-325 MG tablet Commonly known as: NORCO/VICODIN Take 1 tablet by mouth every 4 (four) hours as needed for moderate pain.   ibuprofen 200 MG tablet Commonly known as: ADVIL Take 400 mg by mouth every 6 (six) hours as needed for moderate pain.   Lupron Dep[place] (70-Month) 45 MG injection Generic drug: Leuprolide Acetate (6 Month) Inject 45 mg into the muscle every 6 (six) months.   MAGNESIUM PO Take 1 tablet by mouth at bedtime.    MELATONIN PO Take 40 mg by mouth at bedtime.   MILK THISTLE PO Take 1 capsule by mouth daily.   NATTOKINASE PO Take 2 capsules by mouth at bedtime.   OVER THE COUNTER MEDICATION Take 1 capsule by mouth 2 (two) times daily. [place] Mushroom  Anti-Cancer /Immune Booster   OVER THE COUNTER MEDICATION Place 1 Dose under the tongue 2 (two) times daily. CBD oil daily   OVER THE COUNTER MEDICATION Take 1 capsule by mouth daily. Gamma E otc supplement   OVER THE COUNTER MEDICATION Take 1 capsule by mouth 2 (two) times daily. zyflamend otc supplement   QUERCETIN PO Take 1 tablet by mouth 2 (two) times daily.   Resveratrol 250 MG Caps Take 250 mg by mouth daily.   vitamin B-12 1000 MCG tablet Commonly known as: CYANOCOBALAMIN Take 1,000 mcg by mouth daily.   VITAMIN K PO Take 1 capsule by mouth daily.   Zinc 30 MG Caps Take 30 mg by mouth daily.       Diagnostic Studies: NM Bone Scan Whole Body  Result Date: 10/27/2019 CLINICAL DATA:  Prostate cancer, follow-up examination EXAM: NUCLEAR MEDICINE WHOLE BODY BONE SCAN TECHNIQUE: Whole body anterior and posterior images were obtained approximately 3 hours after intravenous injection of radiopharmaceutical. RADIOPHARMACEUTICALS:  20.1 mCi Technetium-60m MDP IV COMPARISON:  11/26/2018 FINDINGS: Since the prior examination, there has developed intense focal uptake within the left L2 pedicle or lateral vertebral body which, given its asymmetry from the adjacent structures is suspicious for an isolated skeletal metastasis. There is again identified uptake within the posterior elements of L4-5 bilaterally most in keeping with arthro pathic change. Similarly, uptake within the shoulders, knees, and feet bilaterally are most in keeping with degenerative change. There is interval development of radiotracer retention within the left renal collecting system and left ureter in keeping with a distal ureteral obstruction and resultant  hydronephrosis. Normal soft tissue distribution. IMPRESSION: Interval development of intense focal uptake within the L2 vertebral body suspicious for an isolated metastasis. Interval development of a distal left ureteral obstruction with mild left hydronephrosis and retention of radiotracer within the left collecting system. Both this as well as the L2 vertebral lesion could be better assessed with contrast enhanced CT examination. Electronically Signed   By: Fidela Salisbury MD   On: 10/27/2019 19:43   CT ABDOMEN PELVIS W CONTRAST  Result Date: 11/04/2019 CLINICAL DATA:  Prostate cancer restaging, ongoing IV Lupron. Rising PSA level. EXAM: CT ABDOMEN AND PELVIS WITH CONTRAST TECHNIQUE: Multidetector CT imaging of the abdomen and pelvis was performed using the standard protocol following bolus administration of intravenous contrast. CONTRAST:  19mL OMNIPAQUE IOHEXOL 300 MG/ML  SOLN COMPARISON:  Multiple exams, including PET-CT from 12/11/2018 bone scan from 10/27/2019 FINDINGS: Lower chest: Gynecomastia. Left anterior descending and right coronary artery atherosclerosis along with descending thoracic aortic atherosclerosis.  Hepatobiliary: Unremarkable.  The gallbladder appears normal. Pancreas: Unremarkable Spleen: Unremarkable Adrenals/Urinary Tract: Masslike soft tissue density posteriorly in the urinary bladder with possibilities including unusual lobular extension of prostate gland versus transitional cell carcinoma/bladder tumor as shown on images 76-80 of series 2. There is prominent left hydronephrosis and hydroureter extending down to the left UVJ, blockage is likely caused by this tumor. Delayed excretion on the left. There borderline right hydroureter as well but no right hydronephrosis. Stomach/Bowel: Mild sigmoid diverticulosis. Vascular/Lymphatic: Aortoiliac atherosclerotic vascular disease. Mild left periaortic adenopathy including a 1.0 cm in short axis node on image 38/2 (previously 0.7 cm) and a  1.0 cm left periaortic node on image 48/2 (previously 0.7 cm). Peripancreatic node 1.0 cm in short axis on image 32/2, previously the same. Left external iliac node 0.8 cm in short axis, image 70/2, previously 0.7 cm. Reproductive: Heterogeneous enhancement in the prostate gland. I am uncertain whether the nodular soft tissue density extending along the bladder base is due to invading prostate tumor or transitional cell carcinoma. Other: No supplemental non-categorized findings. Musculoskeletal: Supraumbilical bilobed hernia containing adipose tissue with mild stranding and mild stranding in the adjacent omentum near the hernia. Fatty left spermatic cord possibly from small indirect inguinal hernia containing adipose tissue or lipoma along the spermatic cord. Compared to CT of 12/11/2018, there is a new sclerotic lesion in the left anterior vertebral body measuring 1.3 by 1.0 cm (image 36/2) which corresponds with the intense activity on the bone scan, favoring a small sclerotic osseous metastatic lesion. Stable 5 mm sclerotic lesion in the left iliac crest, nonspecific. Schmorl's node along the superior endplate of L3. Grade 1 degenerative anterolisthesis at L4-5 with facet arthropathy at L4-5 and L5-S1. The lumbar spondylosis and degenerative disc disease contributes to bilateral foraminal impingement at L4-5 and left foraminal impingement at L5-S1. IMPRESSION: 1. Masslike soft tissue density posteriorly in the urinary bladder with possibilities including unusual lobular extension of prostate tumor versus new transitional cell carcinoma/bladder tumor. There is prominent left hydronephrosis and hydroureter extending down to the left UVJ, blockage is likely caused by this tumor. 2. Mild left periaortic adenopathy, increased from prior, suspicious for malignancy. 3. New sclerotic lesion in the left anterior vertebral body at L1, favoring a small sclerotic osseous metastatic lesion. 4. Other imaging findings of  potential clinical significance: Coronary atherosclerosis. Gynecomastia. Mild sigmoid diverticulosis. Supraumbilical bilobed hernia containing adipose tissue with mild stranding and mild stranding in the adjacent omentum near the hernia. Fatty left spermatic cord possibly from small indirect inguinal hernia containing adipose tissue or lipoma along the spermatic cord. Lumbar spondylosis and degenerative disc disease causing bilateral foraminal impingement at L4-5 and left foraminal impingement at L5-S1. 5. Aortic atherosclerosis. Aortic Atherosclerosis (ICD10-I70.0). Electronically Signed   By: Van Clines M.D.   On: 11/04/2019 08:16    Disposition: Discharge disposition: 01-Home or Self Care       Discharge Instructions    Discharge patient   Complete by: As directed    Discharge disposition: 01-Home or Self Care   Discharge patient date: 11/10/2019       Follow-up [place].   Why: Please call the office to arrange f/u in 2-3 weeks.  Contact information: [address] [place] [address] [phone]               Signed: Irine Seal 11/11/2019, 6:44 AM

## 2019-11-12 ENCOUNTER — Telehealth: Payer: Self-pay | Admitting: Urology

## 2019-11-12 ENCOUNTER — Ambulatory Visit (HOSPITAL_COMMUNITY): Payer: BC Managed Care – PPO

## 2019-11-12 NOTE — Telephone Encounter (Signed)
Rhea called and inquired about an authorization for this pts turp procedure. I faxed clinical information to [place] at  [phone] as she requested. Temp auth #[id number].

## 2019-11-13 NOTE — Progress Notes (Signed)
.   Pharmacist Chemotherapy Monitoring - Initial Assessment    Anticipated start date: 083021    Regimen:  . Are orders appropriate based on the patient's diagnosis, regimen, and cycle? Yes . Does the plan date match the patient's scheduled date? Yes . Is the sequencing of drugs appropriate? Yes . Are the premedications appropriate for the patient's regimen? Yes . Prior Authorization for treatment is: Approved o If applicable, is the correct biosimilar selected based on the patient's insurance? yes  Organ Function and Labs: Marland Kitchen Are dose adjustments needed based on the patient's renal function, hepatic function, or hematologic function? No . Are appropriate labs ordered prior to the start of patient's treatment? Yes . Other organ system assessment, if indicated: N/A . The following baseline labs, if indicated, have been ordered: N/A  Dose Assessment: . Are the drug doses appropriate? Yes . Are the following correct: o Drug concentrations Yes o IV fluid compatible with drug Yes o Administration routes Yes o Timing of therapy Yes . If applicable, does the patient have documented access for treatment and/or plans for port-a-cath placement? no . If applicable, have lifetime cumulative doses been properly documented and assessed? no Lifetime Dose Tracking  No doses have been documented on this patient for the following tracked chemicals: Doxorubicin, Epirubicin, Idarubicin, Daunorubicin, Mitoxantrone, Bleomycin, Oxaliplatin, Carboplatin, Liposomal Doxorubicin  o   Toxicity Monitoring/Prevention: . The patient has the following take home antiemetics prescribed: N/A . The patient has the following take home medications prescribed: N/A . Medication allergies and previous infusion related reactions, if applicable, have been reviewed and addressed. Yes . The patient's current medication list has been assessed for drug-drug interactions with their chemotherapy regimen. no significant drug-drug  interactions were identified on review.  Order Review: . Are the treatment plan orders signed? No . Is the patient scheduled to see a provider prior to their treatment? Yes  I verify that I have reviewed each item in the above checklist and answered each question accordingly.  Wynona Neat 11/13/2019 12:24 PM

## 2019-11-15 MED ORDER — PROCHLORPERAZINE MALEATE 10 MG PO TABS: 10.0000 mg | ORAL_TABLET | Freq: Four times a day (QID) | ORAL | 1 refills | Status: DC | PRN

## 2019-11-15 MED ORDER — PREDNISONE 5 MG PO TABS: 5.0000 mg | ORAL_TABLET | Freq: Two times a day (BID) | ORAL | 3 refills | Status: DC

## 2019-11-15 NOTE — Patient Instructions (Signed)
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  You will receive docetaxel (Taxotere) in the clinic every 21 days.  You will also be given a medication pegfilgrastim (Fulphila) 2 days after treatment to help increase your white blood cell (WBC) production.  The intent of this treatment is to control your cancer, keep it from spreading further, and to alleviate any symptoms you may be having related to your disease. You will see the doctor regularly throughout treatment.  We will obtain blood work from you prior to every treatment. The doctor monitors your response to treatment by the way you are feeling, your blood work, and scans periodically.  There will be wait times while you are here for treatment.  It will take about 30 minutes to 1 hour for your lab work to result.  Then there will be wait times while pharmacy mixes your medications.  Medications you will receive in the clinic prior to your chemotherapy medications:  Zofran: Anti-nausea medication given to help prevent  nausea and vomiting that happens with certain chemotherapy drugs.    Pepcid:  This medication is a histamine blocker that helps prevent and allergic reaction to your chemotherapy.   Dexamethasone:  This is a steroid given prior to chemotherapy to help prevent allergic reactions; it may also help prevent and control nausea and diarrhea.   Benadryl:  This is a histamine blocker (different from the Pepcid) that helps prevent allergic/infusion reactions to your chemotherapy. This medication may cause dizziness/drowsiness.    Post-treatment: Fulphila - this is a bone marrow stimulant you will receive after chemotherapy to help your body produce neutrophils (a type of white blood cell).  These white blood cells help your body fight infections. You will receive this medication via injection under the skin in your belly or the back of your arm at least 24 hours after  receiving chemotherapy.    Docetaxel (Taxotere)  About This Drug Docetaxel is used to treat cancer. It is given in the vein (IV).  It will take 1 hour to infuse.  The first infusion will take longer as we start it off at a very slow rate, which we increase every 15 minutes until we reach the maximum infusion rate.  We do this to monitor for infusion/allergic reactions.  Your nurse will remain in the room with you for the first 15 minutes of this infusion.   Possible Side Effects . Bone marrow suppression. This is a decrease in the number of white blood cells, red blood cells, and platelets. This may raise your risk of infection, make you tired and weak (fatigue), and raise your risk of bleeding.  . Fever in the setting of decreased white blood cells, which is a serious condition that can be life threatening  . Soreness of the mouth and throat. You may have red areas, white patches, or sores that hurt.  . Nausea and vomiting (throwing up)  . Constipation (not able to move bowels)  . Diarrhea (loose bowel movements)  . Infections  . Swelling of your legs, ankles, and/or feet  . Changes in the way food and drinks taste  . Effects on the nerves are called peripheral neuropathy. You may feel numbness, tingling, or pain in your hands and feet. It may be hard for you to button your clothes, open jars, or walk as usual. The effect on the nerves may get worse with more doses of the drug. These effects get better  in some people after the drug is stopped but it does not get better in all people.  . Decreased appetite (decreased hunger)  . Weakness  . Pain  . Muscle pain/aching  . Trouble breathing  . Changes in your nail color, you may have nail loss and/or brittle nail  . Hair loss. Hair loss is often temporary, although there have been cases of permanent hair loss reported. Hair loss may happen suddenly or gradually. If you lose hair, you may lose it from your head, face, armpits,  pubic area, chest, and/or legs. You may also notice your hair getting thin.  . Allergic skin reaction. You may develop blisters on your skin that are filled with fluid or a severe red rash all over your body that may be painful.  . Allergic reactions, including anaphylaxis are rare but may happen in some patients. Signs of allergic reaction to this drug may be swelling of the face, feeling like your tongue or throat are swelling, trouble breathing, rash, itching, fever, chills, feeling dizzy, and/or feeling that your heart is beating in a fast or not normal way. If this happens, do not take another dose of this drug. You should get urgent medical treatment.  Note: Not all possible side effects are included above.  Warnings and Precautions . Severe bone marrow suppression  . Severe allergic reactions, including anaphylaxis which can be life-threatening.  . Swelling (inflammation) in the colon in the setting of severely low white blood cells, which raises your risk of infection and can be life-threatening  . Severe swelling in the eye or other changes in eyesight  . Severe swelling of your legs, ankles and/or feet. Sometimes, fluid can build up in your lungs and/or around your heart causing you trouble breathing.  . If you have a history of abnormal liver function, receive high doses of docetaxel, or have a history of lung cancer and have received treatment with a platinum (type of chemotherapy medication), you have an increased risk of death  . Severe weakness  . This drug may raise your risk of getting a second cancer such as leukemia and myelodysplastic syndrome  . Severe peripheral neuropathy and/or numbness, tingling or a sensation of pins and needles in your arms, hands, legs or feet  . This drug contains alcohol and may affect your central nervous system. The central nervous system is made up of your brain and spinal cord. You may feel drunk during and after your treatment and it can  impair your ability to drive or use machinery for one to two hours after infusion.  Note: Some of the side effects above are very rare. If you have concerns and/or questions, please discuss them with your medical team.  Important Information . This drug may be present in the saliva, tears, sweat, urine, stool, vomit, semen, and vaginal secretions. Talk to your doctor and/or your nurse about the necessary precautions to take during this time.  Treating Side Effects . Manage tiredness by pacing your activities for the day.  . Be sure to include periods of rest between energy-draining activities.  . Get regular exercise. If you feel too tired to exercise vigorously, try taking a short walk.  . To decrease the risk of infection, wash your hands regularly.  . Avoid close contact with people who have a cold, the flu, or other infections.  . Take your temperature as your doctor or nurse tells you, and whenever you feel like you may have a fever.  Marland Kitchen  To help decrease bleeding, use a soft toothbrush. Check with your nurse before using dental floss.  . Be very careful when using knives or tools.  . Use an electric shaver instead of a razor.  . Mouth care is very important. Your mouth care should consist of routine, gentle cleaning of your teeth or dentures and rinsing your mouth with a mixture of 1/2 teaspoon of salt in 8 ounces of water or 1/2 teaspoon of baking soda in 8 ounces of water. This should be done at least after each meal and at bedtime.  . If you have mouth sores, avoid mouthwash that has alcohol. Also avoid alcohol and smoking because they can bother your mouth and throat.  . Ask your doctor or nurse about medicines that are available to help stop or lessen constipation and/or diarrhea.  . If you are not able to move your bowels, check with your doctor or nurse before you use enemas, laxatives, or suppositories.  . Drink plenty of fluids (a minimum of eight 8 oz glasses of a  non-caffienated beverage per day is recommended).  . If you throw up or have loose bowel movements, you should drink more fluids so that you do not become dehydrated (lack of water in the body from losing too much fluid).  . If you get diarrhea, eat low-fiber foods that are high in protein and calories and avoid foods that can irritate your digestive tracts or lead to cramping.  . To help with nausea and vomiting, eat small, frequent meals instead of three large meals a day. Choose foods and drinks that are at room temperature. Ask your nurse or doctor about other helpful tips and medicine that is available to help or stop lessen these symptoms.  . To help with decreased appetite, eat foods high in calories and protein, such as meat, poultry, fish, dry beans, tofu, eggs, nuts, milk, yogurt, cheese, ice cream, pudding, and nutritional supplements.  . Consider using sauces and spices to increase taste. Daily exercise, with your doctor's approval, may increase your appetite.   Marland Kitchen Keeping your pain under control is important to your well-being. Please tell your doctor or nurse if you are experiencing pain.  . If you get a rash do not put anything on it unless your doctor or nurse says you may. Keep the area around the rash clean and dry. Ask your doctor for medicine if your rash bothers you.  Marland Kitchen Keeping your nails moisturized may help with brittleness.  . To help with hair loss, wash with a mild shampoo and avoid washing your hair every day.  . Avoid rubbing your scalp, pat your hair or scalp dry.  . Avoid coloring your hair.  . Limit your use of hair spray, electric curlers, blow dryers, and curling irons.  . If you are interested in getting a wig, talk to your nurse. You can also call the [facility] at [phone] to find out information about the "Look Good, Feel Better" program close to where you live. It is a free program where women getting chemotherapy can learn about wigs,  turbans and scarves as well as makeup techniques and skin and nail care.  . If you have numbness and tingling in your hands and feet, be careful when cooking, walking, and handling sharp objects and hot liquids.   Food and Drug Interactions . There are no known interactions of docetaxel with food.  . This drug may interact with other medicines. Tell your doctor and  pharmacist about all the prescription and over-the-counter medicines and dietary supplements (vitamins, minerals, herbs and others) that you are taking at this time. Also, check with your doctor or pharmacist before starting any new prescription or over-the-counter medicines, or dietary supplements to make sure that there are no interactions.   When to Call the Doctor Call your doctor or nurse if you have any of these symptoms and/or any new or unusual symptoms: . Fever of 100.4 F (38 C) or higher  . Chills  . Blurred vision or other changes in eyesight  . Easy bruising or bleeding  . Wheezing or trouble breathing  . Feeling dizzy or lightheaded  . Fatigue that interferes with your daily activities  . Pain in your mouth or throat that makes it hard to eat or drink  . Nausea that stops you from eating or drinking and/or is not relieved by prescribed medicines   . Throwing up  . Lasting loss of appetite or rapid weight loss of five pounds in a week  . Diarrhea, 4 times in one day or diarrhea with lack of strength or a feeling of being dizzy  . No bowel movement in 3 days or when you feel uncomfortable  . Severe abdominal pain that does not go away  . Numbness, tingling, or pain your hands and feet  . Swelling of legs, ankles, or feet  . Weight gain of 5 pounds in one week (fluid retention)  . Extreme weakness that interferes with normal activities  . New rash and/or itching  . Rash that is not relieved by prescribed medicines  . Signs of allergic reaction: swelling of the face, feeling like your tongue  or throat are swelling, trouble breathing, rash, itching, fever, chills, feeling dizzy, and/or feeling that your heart is beating in a fast or not normal way  . Signs of possible liver problems: dark urine, pale bowel movements, bad stomach pain, feeling very tired and weak, unusual itching, or yellowing of the eyes or skin  . Symptoms of being drunk, confusion, or being very sleepy  . If you think you may be pregnant  Reproduction Warnings . Pregnancy warning: This drug can have harmful effects on the unborn baby. Women of childbearing potential should use effective methods of birth control during your cancer treatment. Let your doctor know right away if you think you may be pregnant.  . Breastfeeding warning: It is not known if this drug passes into breast milk. For this reason, women should talk to their doctor about the risks and benefits of breastfeeding during treatment with this drug because this drug may enter the breast milk and cause harm to a breastfeeding baby.  . Fertility warning: Human fertility studies have not been done with this drug. Talk with your doctor or nurse if you plan to have children. Ask for information on sperm or egg banking.   Pegfilgrastim-xxxx (Neulasta, Neulasta Onpro, FulphilaT, UdenycaT)  About This Drug  Pegfilgrastim-xxxx belongs to a class of medicines called granulocyte colony-stimulating factor (G-CSF). G-CSF helps the body make more white blood cells. White blood cells help fight infection in your body. This drug is given as an injection under the skin (subcutaneously).  Possible Side Effects . Bone pain . Pain in your arms and/or legs  Note: Each of the side effects above was reported in 5% or greater of patients treated with pegfilgrastim-xxxx. Not all possible side effects are included above.  Warnings and Precautions . Enlargement and inflammation (swelling) of your spleen,  which can very rarely rupture and be lifethreatening. Signs of  enlargement may be left-sided pain in your abdomen and/or shoulder.  . Trouble breathing because of fluid build-up around your lungs and/or inflammation (swelling) of the lungs.  . Allergic reactions, including anaphylaxis are rare but may happen in some patients. Signs of allergic reaction to this drug may be swelling of the face, feeling like your tongue or throat are swelling, trouble breathing, rash, itching, fever, chills, feeling dizzy, and/or feeling that your heart is beating in a fast or not normal way. If this happens, do not take another dose of this drug. You should get urgent medical treatment.  . Sickle cell crisis in sickle cell patients treated with pegfilgrastim-xxxx  . Changes in your kidney function  . A rapid increase in your white blood cells may happen  . A syndrome where fluid and protein can leak from your blood vessels into your tissues. This can cause a decrease in your blood protein level and blood pressure and fluid can accumulate in your tissues and/or lungs.   . Inflammation of the aorta- symptoms may include fever, abdominal pain, back pain and feeling tired.  Note: Some of the side effects above are very rare. If you have concerns and/or questions, please discuss them with your medical team.  How to Take Your Medication . Talk to your doctor, nurse and/or pharmacist for proper preparation, dosing, administration. It is important that you follow the instructions and precautions closely.  Treating Side Effects . Keeping your pain under control is important to your well-being. Please tell your doctor or nurse if you are experiencing pain.  Taking an over-the-counter Claritin starting on the day prior to receiving this injection, and for several days after, may help decrease the body aches and bone pains that are sometimes associated with receiving this medication.   Food and Drug Interactions . There are no known interactions of pegfilgrastim-xxxx with  food.  . Tell your doctor and pharmacist about all the prescription and over-the-counter medicines and dietary supplements (vitamins, minerals, herbs and others) that you are taking at this time. Also, check with your doctor or pharmacist before starting any new prescription or over-the-counter medicines, or dietary supplements to make sure that there are no interactions.  When to Call the Doctor Call your doctor or nurse if you have any of these symptoms and/or any new or unusual symptoms: . Fever of 100.4 F (38 C) or higher  . Chills  . Cough  . Wheezing and/or trouble breathing  . Feeling dizzy or lightheaded  . Tiredness that interferes with your daily activities  . Pain in the left side of the abdomen and/or shoulder pain  . Back pain  . Decreased urine, or very dark urine  . Pain that does not go away or is not relieved by prescribed medicine  . Swelling of legs, ankles, or feet  . Signs of allergic reaction: swelling of the face, feeling like your tongue or throat are swelling, trouble breathing, rash, itching, fever, chills, feeling dizzy, and/or feeling that your heart is beating in a fast or not normal way. If this happens, call 911 for emergency care.  . if you think you may be pregnant    SELF CARE ACTIVITIES WHILE RECEIVING CHEMOTHERAPY:  Hydration Increase your fluid intake 48 hours prior to treatment and drink at least 8 to 12 cups (64 ounces) of water/decaffeinated beverages per day after treatment. You can still have your cup of coffee or soda  but these beverages do not count as part of your 8 to 12 cups that you need to drink daily. No alcohol intake.  Medications Continue taking your normal prescription medication as prescribed.  If you start any new herbal or new supplements please let us know first to make sure it is safe.  Mouth Care Have teeth cleaned professionally before starting treatment. Keep dentures and partial plates clean. Use soft  toothbrush and do not use mouthwashes that contain alcohol. Biotene is a good mouthwash that is available at most pharmacies or may be ordered by calling [phone]. Use warm salt water gargles (1 teaspoon salt per 1 quart warm water) before and after meals and at bedtime. If you need dental work, please let the doctor know before you go for your appointment so that we can coordinate the best possible time for you in regards to your chemo regimen. You need to also let your dentist know that you are actively taking chemo. We may need to do labs prior to your dental appointment.  Skin Care Always use sunscreen that has not expired and with SPF (Sun Protection Factor) of 50 or higher. Wear hats to protect your head from the sun. Remember to use sunscreen on your hands, ears, face, & feet.  Use good moisturizing lotions such as udder cream, eucerin, or even Vaseline. Some chemotherapies can cause dry skin, color changes in your skin and nails.    . Avoid long, hot showers or baths. . Use gentle, fragrance-free soaps and laundry detergent. . Use moisturizers, preferably creams or ointments rather than lotions because the thicker consistency is better at preventing skin dehydration. Apply the cream or ointment within 15 minutes of showering. Reapply moisturizer at night, and moisturize your hands every time after you wash them.  Hair Loss (if your doctor says your hair will fall out)  . If your doctor says that your hair is likely to fall out, decide before you begin chemo whether you want to wear a wig. You may want to shop before treatment to match your hair color. . Hats, turbans, and scarves can also camouflage hair loss, although some people prefer to leave their heads uncovered. If you go bare-headed outdoors, be sure to use sunscreen on your scalp. . Cut your hair short. It eases the inconvenience of shedding lots of hair, but it also can reduce the emotional impact of watching your hair fall  out. . Don't perm or color your hair during chemotherapy. Those chemical treatments are already damaging to hair and can enhance hair loss. Once your chemo treatments are done and your hair has grown back, it's OK to resume dyeing or perming hair.  With chemotherapy, hair loss is almost always temporary. But when it grows back, it may be a different color or texture. In older adults who still had hair color before chemotherapy, the new growth may be completely gray.  Often, new hair is very fine and soft.  Infection Prevention Please wash your hands for at least 30 seconds using warm soapy water. Handwashing is the #1 way to prevent the spread of germs. Stay away from sick people or people who are getting over a cold. If you develop respiratory systems such as green/yellow mucus production or productive cough or persistent cough let us know and we will see if you need an antibiotic. It is a good idea to keep a pair of gloves on when going into grocery stores/[employer] to decrease your risk of coming into  contact with germs on the carts, etc. Carry alcohol hand gel with you at all times and use it frequently if out in public. If your temperature reaches 100.4 or higher please call the clinic and let us know.  If it is after hours or on the weekend please go to the ER if your temperature is over 100.4.  Please have your own personal thermometer at home to use.    Sex and bodily fluids If you are going to have sex, a condom must be used to protect the person that isn't taking chemotherapy. Chemo can decrease your libido (sex drive). For a few days after chemotherapy, chemotherapy can be excreted through your bodily fluids.  When using the toilet please close the lid and flush the toilet twice.  Do this for a few day after you have had chemotherapy.   Effects of chemotherapy on your sex life Some changes are simple and won't last long. They won't affect your sex life permanently.  Sometimes you may  feel: . too tired . not strong enough to be very active . sick or sore  . not in the mood . anxious or low  Your anxiety might not seem related to sex. For example, you may be worried about the cancer and how your treatment is going. Or you may be worried about money, or about how you family are coping with your illness.  These things can cause stress, which can affect your interest in sex. It's important to talk to your partner about how you feel.  Remember - the changes to your sex life don't usually last long. There's usually no medical reason to stop having sex during chemo. The drugs won't have any long term physical effects on your performance or enjoyment of sex. Cancer can't be passed on to your partner during sex  Contraception It's important to use reliable contraception during treatment. Avoid getting pregnant while you or your partner are having chemotherapy. This is because the drugs may harm the baby. Sometimes chemotherapy drugs can leave a man or woman infertile.  This means you would not be able to have children in the future. You might want to talk to someone about permanent infertility. It can be very difficult to learn that you may no longer be able to have children. Some people find counselling helpful. There might be ways to preserve your fertility, although this is easier for men than for women. You may want to speak to a fertility expert. You can talk about sperm banking or harvesting your eggs. You can also ask about other fertility options, such as donor eggs. If you have or have had breast cancer, your doctor might advise you not to take the contraceptive pill. This is because the hormones in it might affect the cancer. It is not known for sure whether or not chemotherapy drugs can be passed on through semen or secretions from the vagina. Because of this some doctors advise people to use a barrier method if you have sex during treatment. This applies to vaginal, anal or oral sex.  Generally, doctors advise a barrier method only for the time you are actually having the treatment and for about a week after your treatment. Advice like this can be worrying, but this does not mean that you have to avoid being intimate with your partner. You can still have close contact with your partner and continue to enjoy sex.  Animals If you have cats or birds we just ask that you  not change the litter or change the cage.  Please have someone else do this for you while you are on chemotherapy.   Food Safety During and After Cancer Treatment Food safety is important for people both during and after cancer treatment. Cancer and cancer treatments, such as chemotherapy, radiation therapy, and stem cell/bone marrow transplantation, often weaken the immune system. This makes it harder for your body to protect itself from foodborne illness, also called food poisoning. Foodborne illness is caused by eating food that contains harmful bacteria, parasites, or viruses.  Foods to avoid Some foods have a higher risk of becoming tainted with bacteria. These include: Marland Kitchen Unwashed fresh fruit and vegetables, especially leafy vegetables that can hide dirt and other contaminants . Raw sprouts, such as alfalfa sprouts . Raw or undercooked beef, especially ground beef, or other raw or undercooked meat and poultry . Fatty, fried, or spicy foods immediately before or after treatment.  These can sit heavy on your stomach and make you feel nauseous. . Raw or undercooked shellfish, such as oysters. . Sushi and sashimi, which often contain raw fish.  . Unpasteurized beverages, such as unpasteurized fruit juices, raw milk, raw yogurt, or cider . Undercooked eggs, such as soft boiled, over easy, and poached; raw, unpasteurized eggs; or foods made with raw egg, such as homemade raw cookie dough and homemade mayonnaise  Simple steps for food safety  Shop smart. . Do not buy food stored or displayed in an unclean  area. . Do not buy bruised or damaged fruits or vegetables. . Do not buy cans that have cracks, dents, or bulges. . Pick up foods that can spoil at the end of your shopping trip and store them in a cooler on the way home.  Prepare and clean up foods carefully. . Rinse all fresh fruits and vegetables under running water, and dry them with a clean towel or paper towel. . Clean the top of cans before opening them. . After preparing food, wash your hands for 20 seconds with hot water and soap. Pay special attention to areas between fingers and under nails. . Clean your utensils and dishes with hot water and soap. Marland Kitchen Disinfect your kitchen and cutting boards using 1 teaspoon of liquid, unscented bleach mixed into 1 quart of water.    Dispose of old food. . Eat canned and packaged food before its expiration date (the "use by" or "best before" date). . Consume refrigerated leftovers within 3 to 4 days. After that time, throw out the food. Even if the food does not smell or look spoiled, it still may be unsafe. Some bacteria, such as Listeria, can grow even on foods stored in the refrigerator if they are kept for too long.  Take precautions when eating out. . At restaurants, avoid buffets and salad bars where food sits out for a long time and comes in contact with many people. Food can become contaminated when someone with a virus, often a norovirus, or another "bug" handles it. . Put any leftover food in a "to-go" container yourself, rather than having the server do it. And, refrigerate leftovers as soon as you get home. . Choose restaurants that are clean and that are willing to prepare your food as you order it cooked.   AT HOME MEDICATIONS:  Compazine/Prochlorperazine 10mg  tablet. Take 1 tablet every 6 hours as needed for nausea/vomiting.  (This can make you sleepy)   EMLA cream. Apply a quarter size amount to port site 1 hour prior to chemo. Do not rub in. Cover with plastic wrap.    Diarrhea Sheet   If you are having loose stools/diarrhea, please purchase Imodium and begin taking as outlined:  At the first sign of poorly formed or loose stools you should begin taking Imodium (loperamide) 2 mg capsules.  Take two tablets (4mg ) followed by one tablet (2mg ) every 2 hours - DO NOT EXCEED 8 tablets in 24 hours.  If it is bedtime and you are having loose stools, take 2 tablets at bedtime, then 2 tablets every 4 hours until morning.   Always call the [facility] if you are having loose stools/diarrhea that you can't get under control.  Loose stools/diarrhea leads to dehydration (loss of water) in your body.  We have other options of trying to get the loose stools/diarrhea to stop but you must let us know!   Constipation Sheet  Colace - 100 mg capsules - take 2 capsules daily.  If this doesn't help then you can increase to 2 capsules twice daily.  Please call if the above does not work for you. Do not go more than 2 days without a bowel movement.  It is very important that you do not become constipated.  It will make you feel sick to your stomach (nausea) and can cause abdominal pain and vomiting.  Nausea Sheet   Compazine/Prochlorperazine 10mg  tablet. Take 1 tablet every 6 hours as needed for nausea/vomiting (This can make you drowsy).  If you are having persistent nausea (nausea that does not stop) please call the [facility] and let us know the amount of nausea that you are experiencing.  If you begin to vomit, you need to call the [facility] and if it is the weekend and you have vomited more than one time and can't get it to stop-go to the Emergency Room.  Persistent nausea/vomiting can lead to dehydration (loss of fluid in your body) and will make you feel very weak and unwell. Ice chips, sips of clear liquids, foods that  are at room temperature, crackers, and toast tend to be better tolerated.   SYMPTOMS TO REPORT AS SOON AS POSSIBLE AFTER TREATMENT:  FEVER GREATER THAN 100.4 F  CHILLS WITH OR WITHOUT FEVER  NAUSEA AND VOMITING THAT IS NOT CONTROLLED WITH YOUR NAUSEA MEDICATION  UNUSUAL SHORTNESS OF BREATH  UNUSUAL BRUISING OR BLEEDING  TENDERNESS IN MOUTH AND THROAT WITH OR WITHOUT PRESENCE OF ULCERS  URINARY PROBLEMS  BOWEL PROBLEMS  UNUSUAL RASH      Wear comfortable clothing and clothing appropriate for easy access to any Portacath or PICC line. Let us know if there is anything that we can do to make your therapy better!    What to do if you need assistance after hours or on the weekends: CALL [phone].  HOLD on the line, do not hang up.  You will hear multiple messages but at the end you will be connected with a nurse triage line.  They will contact the doctor if necessary.  Most of the time they will be able to assist you.  Do not call the hospital operator.      I have been informed and understand all of the instructions given to me and have received a copy. I have been instructed to call the clinic [phone] or my family physician as soon as possible for continued medical care, if indicated. I do not have any more questions at this time but understand that I may call the [facility] or the Patient Navigator at [phone] during office hours should I have questions or need assistance in obtaining follow-up care.

## 2019-11-16 ENCOUNTER — Telehealth (INDEPENDENT_AMBULATORY_CARE_PROVIDER_SITE_OTHER): Payer: Self-pay

## 2019-11-16 ENCOUNTER — Inpatient Hospital Stay (HOSPITAL_COMMUNITY): Payer: BC Managed Care – PPO

## 2019-11-16 ENCOUNTER — Encounter (HOSPITAL_COMMUNITY): Payer: Self-pay

## 2019-11-16 ENCOUNTER — Other Ambulatory Visit: Payer: Self-pay

## 2019-11-16 ENCOUNTER — Inpatient Hospital Stay (HOSPITAL_BASED_OUTPATIENT_CLINIC_OR_DEPARTMENT_OTHER): Payer: BC Managed Care – PPO | Admitting: Hematology

## 2019-11-16 VITALS — BP 164/84 | HR 55 | Temp 98.1°F | Resp 18

## 2019-11-16 VITALS — BP 162/101 | HR 76 | Temp 98.6°F | Resp 18 | Wt 168.0 lb

## 2019-11-16 DIAGNOSIS — C778 Secondary and unspecified malignant neoplasm of lymph nodes of multiple regions: Secondary | ICD-10-CM | POA: Diagnosis not present

## 2019-11-16 DIAGNOSIS — Z8042 Family history of malignant neoplasm of prostate: Secondary | ICD-10-CM | POA: Diagnosis not present

## 2019-11-16 DIAGNOSIS — M5136 Other intervertebral disc degeneration, lumbar region: Secondary | ICD-10-CM | POA: Diagnosis not present

## 2019-11-16 DIAGNOSIS — Z836 Family history of other diseases of the respiratory system: Secondary | ICD-10-CM | POA: Diagnosis not present

## 2019-11-16 DIAGNOSIS — Z79899 Other long term (current) drug therapy: Secondary | ICD-10-CM | POA: Diagnosis not present

## 2019-11-16 DIAGNOSIS — Z8249 Family history of ischemic heart disease and other diseases of the circulatory system: Secondary | ICD-10-CM | POA: Diagnosis not present

## 2019-11-16 DIAGNOSIS — I700 Atherosclerosis of aorta: Secondary | ICD-10-CM | POA: Diagnosis not present

## 2019-11-16 DIAGNOSIS — C61 Malignant neoplasm of prostate: Secondary | ICD-10-CM

## 2019-11-16 DIAGNOSIS — Z800 Family history of malignant neoplasm of digestive organs: Secondary | ICD-10-CM | POA: Diagnosis not present

## 2019-11-16 DIAGNOSIS — Z803 Family history of malignant neoplasm of breast: Secondary | ICD-10-CM | POA: Diagnosis not present

## 2019-11-16 DIAGNOSIS — C7951 Secondary malignant neoplasm of bone: Secondary | ICD-10-CM | POA: Diagnosis not present

## 2019-11-16 DIAGNOSIS — Z5111 Encounter for antineoplastic chemotherapy: Secondary | ICD-10-CM | POA: Diagnosis present

## 2019-11-16 DIAGNOSIS — M47816 Spondylosis without myelopathy or radiculopathy, lumbar region: Secondary | ICD-10-CM | POA: Diagnosis not present

## 2019-11-16 DIAGNOSIS — E785 Hyperlipidemia, unspecified: Secondary | ICD-10-CM | POA: Diagnosis not present

## 2019-11-16 DIAGNOSIS — Z818 Family history of other mental and behavioral disorders: Secondary | ICD-10-CM | POA: Diagnosis not present

## 2019-11-16 DIAGNOSIS — R5383 Other fatigue: Secondary | ICD-10-CM | POA: Diagnosis not present

## 2019-11-16 DIAGNOSIS — Z87891 Personal history of nicotine dependence: Secondary | ICD-10-CM | POA: Diagnosis not present

## 2019-11-16 DIAGNOSIS — N1330 Unspecified hydronephrosis: Secondary | ICD-10-CM | POA: Diagnosis not present

## 2019-11-16 LAB — COMPREHENSIVE METABOLIC PANEL
ALT: 18 U/L (ref 0–44)
AST: 19 U/L (ref 15–41)
Albumin: 4.4 g/dL (ref 3.5–5.0)
Alkaline Phosphatase: 65 U/L (ref 38–126)
Anion gap: 10 (ref 5–15)
BUN: 13 mg/dL (ref 8–23)
CO2: 21 mmol/L — ABNORMAL LOW (ref 22–32)
Calcium: 9.4 mg/dL (ref 8.9–10.3)
Chloride: 102 mmol/L (ref 98–111)
Creatinine, Ser: 0.9 mg/dL (ref 0.61–1.24)
GFR calc Af Amer: >60 mL/min (ref >60)
GFR calc non Af Amer: >60 mL/min (ref >60)
Glucose, Bld: 112 mg/dL — ABNORMAL HIGH (ref 70–99)
Potassium: 3.8 mmol/L (ref 3.5–5.1)
Sodium: 133 mmol/L — ABNORMAL LOW (ref 135–145)
Total Bilirubin: 1.2 mg/dL (ref 0.3–1.2)
Total Protein: 7.2 g/dL (ref 6.5–8.1)

## 2019-11-16 LAB — CBC WITH DIFFERENTIAL/PLATELET
Abs Immature Granulocytes: 0.01 10*3/uL (ref 0.00–0.07)
Basophils Absolute: 0.1 10*3/uL (ref 0.0–0.1)
Basophils Relative: 1 %
Eosinophils Absolute: 0.1 10*3/uL (ref 0.0–0.5)
Eosinophils Relative: 2 %
HCT: 39.6 % (ref 39.0–52.0)
Hemoglobin: 13.9 g/dL (ref 13.0–17.0)
Immature Granulocytes: 0 %
Lymphocytes Relative: 31 %
Lymphs Abs: 1.7 10*3/uL (ref 0.7–4.0)
MCH: 31.9 pg (ref 26.0–34.0)
MCHC: 35.1 g/dL (ref 30.0–36.0)
MCV: 90.8 fL (ref 80.0–100.0)
Monocytes Absolute: 0.5 10*3/uL (ref 0.1–1.0)
Monocytes Relative: 9 %
Neutro Abs: 3.1 10*3/uL (ref 1.7–7.7)
Neutrophils Relative %: 57 %
Platelets: 299 10*3/uL (ref 150–400)
RBC: 4.36 MIL/uL (ref 4.22–5.81)
RDW: 11.9 % (ref 11.5–15.5)
WBC: 5.5 10*3/uL (ref 4.0–10.5)
nRBC: 0 % (ref 0.0–0.2)

## 2019-11-16 LAB — PSA: Prostatic Specific Antigen: 20.69 ng/mL — ABNORMAL HIGH (ref 0.00–4.00)

## 2019-11-16 MED ORDER — SODIUM CHLORIDE 0.9 % IV SOLN
75.0000 mg/m2 | Freq: Once | INTRAVENOUS | Status: AC
  Administered 2019-11-16: 140 mg via INTRAVENOUS
  Filled 2019-11-16: qty 14

## 2019-11-16 MED ORDER — SODIUM CHLORIDE 0.9 % IV SOLN: Freq: Once | INTRAVENOUS | Status: AC

## 2019-11-16 MED ORDER — SODIUM CHLORIDE 0.9% FLUSH: 10.0000 mL | INTRAVENOUS | Status: DC | PRN

## 2019-11-16 MED ORDER — ONDANSETRON 8 MG PO TBDP: 8.0000 mg | ORAL_TABLET | Freq: Three times a day (TID) | ORAL | 3 refills | Status: DC | PRN

## 2019-11-16 MED ORDER — SODIUM CHLORIDE 0.9 % IV SOLN
Freq: Once | INTRAVENOUS | Status: AC
  Administered 2019-11-16: 8 mg via INTRAVENOUS
  Filled 2019-11-16: qty 4

## 2019-11-16 MED ORDER — ONDANSETRON HCL 4 MG/2ML IJ SOLN: 8.0000 mg | Freq: Once | INTRAMUSCULAR | Status: DC

## 2019-11-16 MED ORDER — SODIUM CHLORIDE 0.9 % IV SOLN
10.0000 mg | Freq: Once | INTRAVENOUS | Status: AC
  Administered 2019-11-16: 10 mg via INTRAVENOUS
  Filled 2019-11-16: qty 10

## 2019-11-16 NOTE — Progress Notes (Signed)
Patient was assessed by Dr. Katragadda and labs have been reviewed.  Patient is okay to proceed with treatment today. Primary RN and pharmacy aware.   

## 2019-11-16 NOTE — Progress Notes (Signed)

## 2019-11-16 NOTE — Progress Notes (Signed)
[place], [place] [address]   CLINIC:  Medical Oncology/Hematology  PCP:  Doree Albee, MD [place] / [place] [address] [phone]   REASON FOR VISIT:  Follow-up for metastatic prostate cancer  PRIOR THERAPY: TURP on 11/10/2019  NGS Results: None  CURRENT THERAPY: Docetaxel every 3 weeks  BRIEF ONCOLOGIC HISTORY:  Oncology History  Prostate cancer metastatic to multiple sites [facility])  01/25/2017 Initial Diagnosis   Prostate cancer metastatic to multiple sites [facility])   11/16/2019 -  Chemotherapy   The patient had ondansetron (ZOFRAN) injection 8 mg, 8 mg (original dose ), Intravenous,  Once, 0 of 4 cycles Dose modification: 8 mg (Cycle 1) pegfilgrastim-jmdb (FULPHILA) injection 6 mg, 6 mg, Subcutaneous,  Once, 0 of 4 cycles DOCEtaxel (TAXOTERE) 140 mg in sodium chloride 0.9 % 250 mL chemo infusion, 75 mg/m2, Intravenous,  Once, 0 of 4 cycles  for chemotherapy treatment.      CANCER STAGING: Cancer Staging No matching staging information was found for the patient.  INTERVAL HISTORY:  [name], a 64 y.o. male, returns for routine follow-up and consideration for first cycle of chemotherapy. Scott [name] was last contacted via phone on 11/04/2019.  Due for initiating cycle #1 of docetaxel today.   Today he is accompanied by his wife. Overall, he tells me he has been feeling pretty well. He had a TURP on 8/24 with Dr. Jeffie Pollock and his catheter was removed on Friday morning; he was able to urinate on his own afterwards. His appetite is good and he denies having numbness.  Overall, he feels ready for first cycle of chemo today.    REVIEW OF SYSTEMS:  Review of Systems  Constitutional: Positive for fatigue (moderate). Negative for appetite change.  Neurological: Negative for numbness.  All other systems reviewed and are negative.   PAST MEDICAL/SURGICAL HISTORY:  Past Medical History:  Diagnosis Date  . Bladder  obstruction   . Dyspnea   . Essential hypertension, benign 01/26/2019   at [place] office elevated, normal at home, no medications at this time  . History of basal cell cancer   . HLD (hyperlipidemia) 01/26/2019  . Hypothyroidism    history of, not currently taking medication  . Prostate cancer ([place])   . Vitamin D deficiency disease 01/26/2019   Past Surgical History:  Procedure Laterality Date  . CYSTOSCOPY W/ URETERAL STENT PLACEMENT Left 11/10/2019   Procedure: CYSTOSCOPY;  Surgeon: Irine Seal, MD;  Location: [facility];  Service: Urology;  Laterality: Left;  . HERNIA REPAIR  9024   UMBILICAL   . PROSTATE SURGERY    . SKIN CANCER EXCISION    . TONSILLECTOMY Bilateral   . TRANSURETHRAL RESECTION OF PROSTATE N/A 01/25/2017   Procedure: TRANSURETHRAL RESECTION OF THE PROSTATE (TURP);  Surgeon: Alexis Frock, MD;  Location: [facility];  Service: Urology;  Laterality: N/A;  . TRANSURETHRAL RESECTION OF PROSTATE N/A 11/10/2019   Procedure: TRANSURETHRAL RESECTION OF THE PROSTATE (TURP);  Surgeon: Irine Seal, MD;  Location: [facility];  Service: Urology;  Laterality: N/A;  . WISDOM TOOTH EXTRACTION      SOCIAL HISTORY:  Social History   Socioeconomic History  . Marital status: Married    Spouse name: Not on file  . Number of children: 1  . Years of education: Not on file  . Highest education level: Not on file  Occupational History    Comment: working full time  Tobacco Use  . Smoking status: Former  Smoker    Packs/day: 1.50    Years: 35.00    Pack years: 52.50    Types: Cigarettes    Quit date: 01/12/2004    Years since quitting: 15.8  . Smokeless tobacco: Never Used  . Tobacco comment: OVER 30 YEARS HX OF SMOKING   Vaping Use  . Vaping Use: Never used  Substance and Sexual Activity  . Alcohol use: No  . Drug use: Not Currently  . Sexual activity: Not Currently  Other Topics Concern  . Not on file  Social History Narrative   Married for 19 years.Works for [employer]  from home.   Social Determinants of Health   Financial Resource Strain: Low Risk   . Difficulty of Paying Living Expenses: Not hard at all  Food Insecurity: No Food Insecurity  . Worried About Charity fundraiser in the Last Year: Never true  . Ran Out of Food in the Last Year: Never true  Transportation Needs: No Transportation Needs  . Lack of Transportation (Medical): No  . Lack of Transportation (Non-Medical): No  Physical Activity: Inactive  . Days of Exercise per Week: 0 days  . Minutes of Exercise per Session: 0 min  Stress: No Stress Concern Present  . Feeling of Stress : Not at all  Social Connections: Moderately Integrated  . Frequency of Communication with Friends and Family: Twice a week  . Frequency of Social Gatherings with Friends and Family: Once a week  . Attends Religious Services: Never  . Active Member of Clubs or Organizations: Yes  . Attends Archivist Meetings: Never  . Marital Status: Married  Human resources officer Violence: Not At Risk  . Fear of Current or Ex-Partner: No  . Emotionally Abused: No  . Physically Abused: No  . Sexually Abused: No    FAMILY HISTORY:  Family History  Problem Relation Age of Onset  . Emphysema Mother   . Alzheimer's disease Father   . Prostate cancer Father        dx in his 27s  . Prostate cancer Brother        dx in his 37s  . Breast cancer Maternal Grandmother   . Pancreatic cancer Maternal Grandfather   . Dementia Paternal Grandmother   . Heart attack Paternal Grandfather     CURRENT MEDICATIONS:  Current Outpatient Medications  Medication Sig Dispense Refill  . acetaminophen (TYLENOL) 500 MG tablet Take 1,000 mg by mouth at bedtime.    . ASTRAGALUS PO Take 1 tablet by mouth 2 (two) times daily.    Azucena Freed Serrata (BOSWELLIA PO) Take 1 capsule by mouth 2 (two) times daily.    . Cholecalciferol (D 5000) 5000 units capsule Take 5,000-10,000 Units by mouth See admin instructions. Take 10000 units in the  morning and 5000 units at night    . cimetidine (TAGAMET) 200 MG tablet Take 200-400 mg by mouth daily as needed (inflammation).    . DOCETAXEL IV Inject 75 mg/m2 into the vein every 21 ( twenty-one) days.     Marland Kitchen dutasteride (AVODART) 0.5 MG capsule Take 1 capsule (0.5 mg total) by mouth daily. 30 capsule 11  . estradiol (VIVELLE-DOT) 0.1 MG/24HR patch Place 1 patch (0.1 mg total) onto the skin 2 (two) times a week. For management of hot flashes for ADT. (Patient taking differently: Place 1 patch onto the skin See admin instructions. Apply 1 patch once every 4 days For management of hot flashes for ADT.) 8 patch 11  .  HYDROcodone-acetaminophen (NORCO/VICODIN) 5-325 MG tablet Take 1 tablet by mouth every 4 (four) hours as needed for moderate pain. 10 tablet 0  . ibuprofen (ADVIL) 200 MG tablet Take 400 mg by mouth every 6 (six) hours as needed for moderate pain.    Marland Kitchen Leuprolide Acetate, 6 Month, (LUPRON DEPOT, 5-MONTH,) 45 MG injection Inject 45 mg into the muscle every 6 (six) months.    Marland Kitchen MAGNESIUM PO Take 1 tablet by mouth at bedtime.    Marland Kitchen MELATONIN PO Take 40 mg by mouth at bedtime.    Marland Kitchen MILK THISTLE PO Take 1 capsule by mouth daily.    Marland Kitchen NATTOKINASE PO Take 2 capsules by mouth at bedtime.    Marland Kitchen OVER THE COUNTER MEDICATION Take 1 capsule by mouth 2 (two) times daily. [place] Mushroom  Anti-Cancer /Immune Scientist, product/process development     . OVER THE COUNTER MEDICATION Place 1 Dose under the tongue 2 (two) times daily. CBD oil daily     . OVER THE COUNTER MEDICATION Take 1 capsule by mouth daily. Gamma E otc supplement    . OVER THE COUNTER MEDICATION Take 1 capsule by mouth 2 (two) times daily. zyflamend otc supplement    . pegfilgrastim-jmdb (FULPHILA) 6 MG/0.6ML injection Inject 6 mg into the skin every 21 ( twenty-one) days. On Day 3 with each cycle of chemotherapy    . predniSONE (DELTASONE) 5 MG tablet Take 1 tablet (5 mg total) by mouth 2 (two) times daily with a meal. Take 1 tablet in the morning and 1 tablet  at bedtime 60 tablet 3  . prochlorperazine (COMPAZINE) 10 MG tablet Take 1 tablet (10 mg total) by mouth every 6 (six) hours as needed (Nausea or vomiting). 30 tablet 1  . QUERCETIN PO Take 1 tablet by mouth 2 (two) times daily.    Marland Kitchen Resveratrol 250 MG CAPS Take 250 mg by mouth daily.    . vitamin B-12 (CYANOCOBALAMIN) 1000 MCG tablet Take 1,000 mcg by mouth daily.    Marland Kitchen VITAMIN K PO Take 1 capsule by mouth daily.    . Zinc 30 MG CAPS Take 30 mg by mouth daily.     No current facility-administered medications for this visit.    ALLERGIES:  Allergies  Allergen Reactions  . Wellbutrin [Bupropion] Hives    PHYSICAL EXAM:  Performance status (ECOG): 0 - Asymptomatic  Vitals:   11/16/19 0848  BP: (!) 162/101  Pulse: 76  Resp: 18  Temp: 98.6 F (37 C)  SpO2: 98%   Wt Readings from Last 3 Encounters:  11/16/19 168 lb (76.2 kg)  11/10/19 174 lb 12.8 oz (79.3 kg)  10/09/19 172 lb (78 kg)   Physical Exam Vitals reviewed.  Constitutional:      Appearance: Normal appearance.  Cardiovascular:     Rate and Rhythm: Normal rate and regular rhythm.     Pulses: Normal pulses.     Heart sounds: Normal heart sounds.  Pulmonary:     Effort: Pulmonary effort is normal.     Breath sounds: Normal breath sounds.  Musculoskeletal:     Right lower leg: No edema.     Left lower leg: No edema.  Neurological:     General: No focal deficit present.     Mental Status: He is alert and oriented to person, place, and time.  Psychiatric:        Mood and Affect: Mood normal.        Behavior: Behavior normal.     LABORATORY  DATA:  I have reviewed the labs as listed.  CBC Latest Ref Rng & Units 11/16/2019 11/10/2019 09/28/2019  WBC 4.0 - 10.5 K/uL 5.5 5.8 6.2  Hemoglobin 13.0 - 17.0 g/dL 13.9 13.6 13.5  Hematocrit 39 - 52 % 39.6 38.1(L) 39.0  Platelets 150 - 400 K/uL 299 251 253   CMP Latest Ref Rng & Units 11/16/2019 11/03/2019 09/28/2019  Glucose 70 - 99 mg/dL 112(H) - 101(H)  BUN 8 - 23 mg/dL  13 - 15  Creatinine 0.61 - 1.24 mg/dL 0.90 1.10 0.95  Sodium 135 - 145 mmol/L 133(L) - 137  Potassium 3.5 - 5.1 mmol/L 3.8 - 4.2  Chloride 98 - 111 mmol/L 102 - 105  CO2 22 - 32 mmol/L 21(L) - 22  Calcium 8.9 - 10.3 mg/dL 9.4 - 9.1  Total Protein 6.5 - 8.1 g/dL 7.2 - 7.2  Total Bilirubin 0.3 - 1.2 mg/dL 1.2 - 0.8  Alkaline Phos 38 - 126 U/L 65 - 55  AST 15 - 41 U/L 19 - 20  ALT 0 - 44 U/L 18 - 15  No results found for: PSA  DIAGNOSTIC IMAGING:  I have independently reviewed the scans and discussed with the patient. NM Bone Scan Whole Body  Result Date: 10/27/2019 CLINICAL DATA:  Prostate cancer, follow-up examination EXAM: NUCLEAR MEDICINE WHOLE BODY BONE SCAN TECHNIQUE: Whole body anterior and posterior images were obtained approximately 3 hours after intravenous injection of radiopharmaceutical. RADIOPHARMACEUTICALS:  20.1 mCi Technetium-63m MDP IV COMPARISON:  11/26/2018 FINDINGS: Since the prior examination, there has developed intense focal uptake within the left L2 pedicle or lateral vertebral body which, given its asymmetry from the adjacent structures is suspicious for an isolated skeletal metastasis. There is again identified uptake within the posterior elements of L4-5 bilaterally most in keeping with arthro pathic change. Similarly, uptake within the shoulders, knees, and feet bilaterally are most in keeping with degenerative change. There is interval development of radiotracer retention within the left renal collecting system and left ureter in keeping with a distal ureteral obstruction and resultant hydronephrosis. Normal soft tissue distribution. IMPRESSION: Interval development of intense focal uptake within the L2 vertebral body suspicious for an isolated metastasis. Interval development of a distal left ureteral obstruction with mild left hydronephrosis and retention of radiotracer within the left collecting system. Both this as well as the L2 vertebral lesion could be better  assessed with contrast enhanced CT examination. Electronically Signed   By: Fidela Salisbury MD   On: 10/27/2019 19:43   CT ABDOMEN PELVIS W CONTRAST  Result Date: 11/04/2019 CLINICAL DATA:  Prostate cancer restaging, ongoing IV Lupron. Rising PSA level. EXAM: CT ABDOMEN AND PELVIS WITH CONTRAST TECHNIQUE: Multidetector CT imaging of the abdomen and pelvis was performed using the standard protocol following bolus administration of intravenous contrast. CONTRAST:  166mL OMNIPAQUE IOHEXOL 300 MG/ML  SOLN COMPARISON:  Multiple exams, including PET-CT from 12/11/2018 bone scan from 10/27/2019 FINDINGS: Lower chest: Gynecomastia. Left anterior descending and right coronary artery atherosclerosis along with descending thoracic aortic atherosclerosis. Hepatobiliary: Unremarkable.  The gallbladder appears normal. Pancreas: Unremarkable Spleen: Unremarkable Adrenals/Urinary Tract: Masslike soft tissue density posteriorly in the urinary bladder with possibilities including unusual lobular extension of prostate gland versus transitional cell carcinoma/bladder tumor as shown on images 76-80 of series 2. There is prominent left hydronephrosis and hydroureter extending down to the left UVJ, blockage is likely caused by this tumor. Delayed excretion on the left. There borderline right hydroureter as well but no right hydronephrosis. Stomach/Bowel: Mild  sigmoid diverticulosis. Vascular/Lymphatic: Aortoiliac atherosclerotic vascular disease. Mild left periaortic adenopathy including a 1.0 cm in short axis node on image 38/2 (previously 0.7 cm) and a 1.0 cm left periaortic node on image 48/2 (previously 0.7 cm). Peripancreatic node 1.0 cm in short axis on image 32/2, previously the same. Left external iliac node 0.8 cm in short axis, image 70/2, previously 0.7 cm. Reproductive: Heterogeneous enhancement in the prostate gland. I am uncertain whether the nodular soft tissue density extending along the bladder base is due to invading  prostate tumor or transitional cell carcinoma. Other: No supplemental non-categorized findings. Musculoskeletal: Supraumbilical bilobed hernia containing adipose tissue with mild stranding and mild stranding in the adjacent omentum near the hernia. Fatty left spermatic cord possibly from small indirect inguinal hernia containing adipose tissue or lipoma along the spermatic cord. Compared to CT of 12/11/2018, there is a new sclerotic lesion in the left anterior vertebral body measuring 1.3 by 1.0 cm (image 36/2) which corresponds with the intense activity on the bone scan, favoring a small sclerotic osseous metastatic lesion. Stable 5 mm sclerotic lesion in the left iliac crest, nonspecific. Schmorl's node along the superior endplate of L3. Grade 1 degenerative anterolisthesis at L4-5 with facet arthropathy at L4-5 and L5-S1. The lumbar spondylosis and degenerative disc disease contributes to bilateral foraminal impingement at L4-5 and left foraminal impingement at L5-S1. IMPRESSION: 1. Masslike soft tissue density posteriorly in the urinary bladder with possibilities including unusual lobular extension of prostate tumor versus new transitional cell carcinoma/bladder tumor. There is prominent left hydronephrosis and hydroureter extending down to the left UVJ, blockage is likely caused by this tumor. 2. Mild left periaortic adenopathy, increased from prior, suspicious for malignancy. 3. New sclerotic lesion in the left anterior vertebral body at L1, favoring a small sclerotic osseous metastatic lesion. 4. Other imaging findings of potential clinical significance: Coronary atherosclerosis. Gynecomastia. Mild sigmoid diverticulosis. Supraumbilical bilobed hernia containing adipose tissue with mild stranding and mild stranding in the adjacent omentum near the hernia. Fatty left spermatic cord possibly from small indirect inguinal hernia containing adipose tissue or lipoma along the spermatic cord. Lumbar spondylosis and  degenerative disc disease causing bilateral foraminal impingement at L4-5 and left foraminal impingement at L5-S1. 5. Aortic atherosclerosis. Aortic Atherosclerosis (ICD10-I70.0). Electronically Signed   By: Van Clines M.D.   On: 11/04/2019 08:16     ASSESSMENT:  1.  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  Prominent left hydronephrosis and hydroureter noted.  New sclerotic lesion in the left anterior vertebral body of L1.  Mild left periaortic adenopathy increased from prior, short axis lymph node measuring 1 cm. -Last PSA increased to 15.8 on 09/28/2019. -As there is rapid progression of disease, I have recommended docetaxel chemotherapy.  He is agreeable after long discussion. -Cystoscopy with transurethral resection of tumor in the posterior bladder neck, right and left walls of the bladder, right lobe of the prostate on 11/10/2019. -Plan was to do 6 cycles of docetaxel followed by abiraterone and prednisone.  2.  Left hydroureteronephrosis: -CT scan showed prostate mass invading the posterior bladder and obstructing the ureter.  Creatinine increased to 1.1.    PLAN:  1.  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 -We discussed side effects. -I reviewed his labs today which showed normal CBC  and LFTs.  Creatinine was normal. -He will take prednisone 5 mg daily in the morning with docetaxel. -I plan to see him back in 3 weeks for follow-up with repeat labs and cycle 2.  2.  Left hydroureteronephrosis: -His creatinine has improved to 0.9 today after cystoscopy and resection of the tumor.    Orders placed this encounter:  Orders Placed This Encounter  Procedures  . CBC with Differential/Platelet     [name], MD [place] [phone]   I, Milinda Antis, am acting as a scribe for Dr. Sanda Linger.  I, [name] MD, have reviewed the above documentation for accuracy and completeness, and I agree with the above.

## 2019-11-16 NOTE — Telephone Encounter (Signed)
Please go ahead and send records.

## 2019-11-16 NOTE — Patient Instructions (Addendum)
[place] at [facility] Discharge Instructions  You were seen today by Dr. Delton Coombes. He went over your recent results. Take prednisone 5 mg every morning to control the chemo side effects. Dr. Delton Coombes will see you back in 3 weeks for labs and follow up.   Thank you for choosing [place] at [facility] to provide your oncology and hematology care.  To afford each patient quality time with our provider, please arrive at least 15 minutes before your scheduled appointment time.   If you have a lab appointment with the [facility] please come in thru the Main Entrance and check in at the main information desk  You need to re-schedule your appointment should you arrive 10 or more minutes late.  We strive to give you quality time with our providers, and arriving late affects you and other patients whose appointments are after yours.  Also, if you no show three or more times for appointments you may be dismissed from the clinic at the providers discretion.     Again, thank you for choosing [facility].  Our hope is that these requests will decrease the amount of time that you wait before being seen by our physicians.       _____________________________________________________________  Should you have questions after your visit to [facility], please contact our office at [phone] between the hours of 8:00 a.m. and 4:30 p.m.  Voicemails left after 4:00 p.m. will not be returned until the following business day.  For prescription refill requests, have your pharmacy contact our office and allow 72 hours.    Cancer Center Support Programs:   > Cancer Support Group  2nd Tuesday of the month 1pm-2pm, Journey Room

## 2019-11-16 NOTE — Progress Notes (Signed)
Tolerated first infusion Taxotere w/o adverse reaction.  Alert, in no distress.  VSS  Discharged ambulatory.

## 2019-11-17 ENCOUNTER — Inpatient Hospital Stay (HOSPITAL_COMMUNITY): Payer: BC Managed Care – PPO

## 2019-11-17 ENCOUNTER — Telehealth (HOSPITAL_COMMUNITY): Payer: Self-pay | Admitting: *Deleted

## 2019-11-17 ENCOUNTER — Inpatient Hospital Stay (HOSPITAL_COMMUNITY): Payer: BC Managed Care – PPO | Admitting: General Practice

## 2019-11-17 DIAGNOSIS — C61 Malignant neoplasm of prostate: Secondary | ICD-10-CM

## 2019-11-17 LAB — SURGICAL PATHOLOGY

## 2019-11-17 NOTE — Telephone Encounter (Signed)
Contacted Scott [name] for 24 hour f/u after receiving first Taxotere infusion on 11/16/19.  He reports he is feeling well - denies n/v/d.  Denies decreased appetite.  Reports "scorched tongue" feeling, but states this is not interfering with his p.o. intake.  Instructed to call the clinic with any questions/concerns, or should anything change with his condition.  He verbalizes understanding.

## 2019-11-17 NOTE — Progress Notes (Signed)
[facility] Initial Psychosocial Assessment Clinical Social Work  Clinical Social Work contacted by phone to assess psychosocial, emotional, mental health, and spiritual needs of the patient.   Barriers to care/review of distress screen:  - Transportation:  Do you anticipate any problems getting to appointments?  Do you have someone who can help run errands for you if you need it?  No problems, he drives and wife will drive if needed.   - Help at home:  What is your living situation (alone, family, other)?  If you are physically unable to care for yourself, who would you call on to help you?  Live w wife and 25 year old son Ovid Curd).   - Support system:  What does your support system look like?  Who would you call on if you needed some kind of practical help?  What if you needed someone to talk to for emotional support? Son is dealing w patient's issues "quietly."  Tends to be "stoic", "Zen", accepting of reality.  Son has been required to increase his level of responsibility and independence due to father's situation.  Have multiple circles of friends and feel well supported.  "This is so overwhelming and time consuming."   - Finances:  Are you concerned about finances.  Considering returning to work?  If not, applying for disability?  Continues to work when possible, works for Sports coach firm out of state, works remotely.  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    What is your understanding of where you are with your cancer? Its cause?  Your treatment plan and what happens next?  Prostate cancer diagnosed approx 3 years ago, "it has been slowly progressing over the years, and now it is progressing.  Recent scans show worrisome disease progression, has started chemotherapy again to control disease.   What are your worries for the future as you begin treatment for cancer?  Paying bills, "had just paid off out of pocket expenses in 2018", wife is self employed and he works from home.   Both hope to continue working during his treatments as this helps to pay medical costs.  Concerned about insurance approval for recommended treatments - had delays in getting scans covered due to lack of timely insurance authorization.  Isolating from others due to concerns about COVID exposure.    What are your hopes and priorities during your treatment? What is important to you? What are your goals for your care?  "Has been following my intuition in this whole process", no infections/COVID, minimal side effects, normal as possible family life.  Wants to be informed participant in his care - trusts his treatment team and also researches options himself.    CSW Summary:  Patient and family psychosocial functioning including strengths, limitations, and coping skills:  64 year old married male with metastatic prostate cancer - initially diagnosed approx 3 years ago.  Recent scans show disease progression.  He has excellent support from family and friends.  Is parent of 33 year old son - have frequent communication w son about medical situation and plans.  No unmet practical needs.CSW and patient discussed common feeling and emotions when being diagnosed with cancer, and the importance of support during treatment. CSW informed patient of the support team and support services at [facility]. CSW provided contact information and encouraged patient to call with any questions or concerns.  Identifications of barriers to care:  None noted  Availability of community resources:  Follows Male Care group and  other prostate cancer support organizations/groups online.  Will send him information on [facility] and [facility] as well as [facility].  Will send information on parenting while diagnosed w cancer.    Clinical Social Worker follow up needed: No.   Edwyna Shell, [place] Social Worker Phone:  [phone] CELL:  [phone]

## 2019-11-18 ENCOUNTER — Inpatient Hospital Stay (HOSPITAL_COMMUNITY): Payer: BC Managed Care – PPO | Attending: Hematology

## 2019-11-18 ENCOUNTER — Encounter (HOSPITAL_COMMUNITY): Payer: Self-pay

## 2019-11-18 ENCOUNTER — Other Ambulatory Visit: Payer: Self-pay

## 2019-11-18 ENCOUNTER — Encounter: Payer: Self-pay | Admitting: Urology

## 2019-11-18 VITALS — BP 145/91 | HR 71 | Temp 96.9°F | Resp 18

## 2019-11-18 DIAGNOSIS — C778 Secondary and unspecified malignant neoplasm of lymph nodes of multiple regions: Secondary | ICD-10-CM | POA: Insufficient documentation

## 2019-11-18 DIAGNOSIS — C61 Malignant neoplasm of prostate: Secondary | ICD-10-CM | POA: Insufficient documentation

## 2019-11-18 DIAGNOSIS — Z5111 Encounter for antineoplastic chemotherapy: Secondary | ICD-10-CM | POA: Diagnosis not present

## 2019-11-18 DIAGNOSIS — C7951 Secondary malignant neoplasm of bone: Secondary | ICD-10-CM | POA: Insufficient documentation

## 2019-11-18 DIAGNOSIS — D709 Neutropenia, unspecified: Secondary | ICD-10-CM | POA: Insufficient documentation

## 2019-11-18 DIAGNOSIS — R972 Elevated prostate specific antigen [PSA]: Secondary | ICD-10-CM | POA: Diagnosis not present

## 2019-11-18 MED ORDER — PEGFILGRASTIM-JMDB 6 MG/0.6ML ~~LOC~~ SOSY
6.0000 mg | PREFILLED_SYRINGE | Freq: Once | SUBCUTANEOUS | Status: AC
  Administered 2019-11-18: 6 mg via SUBCUTANEOUS
  Filled 2019-11-18: qty 0.6

## 2019-11-18 NOTE — Progress Notes (Signed)
Pt here today for Fulphila injection. Pt given injection in right lower abdomen. Pt tolerated injection well with no complaints. Pt stable and discharged home ambulatory with wife. Pt to return as scheduled.

## 2019-11-18 NOTE — Progress Notes (Signed)
Patient called the clinic reporting tingling in the toes on the L foot. Dr. Delton Coombes aware and to reassess prior to next treatment.

## 2019-11-29 ENCOUNTER — Other Ambulatory Visit (HOSPITAL_COMMUNITY): Payer: Self-pay | Admitting: Hematology

## 2019-11-29 DIAGNOSIS — C61 Malignant neoplasm of prostate: Secondary | ICD-10-CM

## 2019-12-01 ENCOUNTER — Ambulatory Visit (HOSPITAL_COMMUNITY): Payer: BC Managed Care – PPO

## 2019-12-01 ENCOUNTER — Other Ambulatory Visit (HOSPITAL_COMMUNITY): Payer: BC Managed Care – PPO

## 2019-12-01 ENCOUNTER — Ambulatory Visit (HOSPITAL_COMMUNITY): Payer: BC Managed Care – PPO | Admitting: Hematology

## 2019-12-03 ENCOUNTER — Ambulatory Visit (HOSPITAL_COMMUNITY): Payer: BC Managed Care – PPO

## 2019-12-03 NOTE — Progress Notes (Signed)
Subjective: Scott [name] returns today in f/u from a TURP on 11/10/19 with resection of both UO's for malignant obstruction.   He is voiding better but has some frequency and nocturia 2-4x.  He has some left flank pain with voiding and probably has reflux.   His IPSS is 14.   He has had some hematuria but is improving.   He has some bone pain with the fulphila given for bone marrow support.   He is otherwise tolerating chemo after one dose.  His Cr is down to 0.9 from 1.1 prior to the resection.   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 CT with bilateral non-bulky iliac adenopathy and tiny uptake Lt orbit (no spine mets) by bone scan.  His PSA is up to 15.5 from  9.47 at last check and a doubling over the last year. The testosterone remains castrate at <3 on Lupron and dutasteride. He remains on estradiol 0.1mg  patches for the hot flashes.   He had a telehealth visit with Dr. Tammi Klippel for consideration of radiation therapy to the primary and pelvic nodes from the [place] PET findings on 12/11/18 but decided against that.  He is seeing Dr. Delton Coombes as well but is  reluctant to consider Zytiga because of the prednisone.   He has had no recent hematuria.  He has occasional frequency with urgency and UUI.  He wears a pad. He has a reduced stream.  He has nocturia 3-4x but he is drinking a lot of fluids.   His IPSS is 29-30.   Present Management:  01/2017 begin Lupron androgen deprivation Q62mo (declined reccomended orchiectomy)  04/2017 - PSA 2.86;  07/2017 PSA/T 3.44 / T 3 ==> Lupron 45mg .  10/08/17 PSA 2.1.  02/07/18 PSA 2.19/T 4. added Estradiol 0.1mg  patches twice weekly for the hot flashes.  03/10/18 PSA 1.87/T<3.  05/09/18 PSA 2.09/T10/ Est 32.  07/24/18 PSA 6/T 54  08/08/18 Lupron 45mg .  09/08/18 PSA 4.37  10/22/18 PSA 4.64/T<3.  11/26/18 Bone scan negative/ CT  C/A/P negative. 12/11/18 [facility] PET small equivocal periaortic and left ext iliac nodes and right prostate activity.  I have discussed that with him.  11/28/18 PSA 6.46. 02/10/19 PSA 7.31/T <3. 03/19/19 PSA 8.20 04/11/19  Lupron 45mg  given.  04/23/19 PSA 9.96. 06/23/19 PSA 9.47. 09/28/19 PSA 15.56, T <3.  10/09/19 Lupron 45mg  given 11/10/19  Operative cysto / TURP 2018 with inviltrative bladder neck / prostate mass ( left UO purposefully resected).  11/16/19 PSA 20.69.   11/06/19 Scott [name] has a mass at the base of the bladder consistent with local invasion from his CRCP and he has left ureteral obstruction with a minor increase in his Cr.  He has seen Dr. Delton Coombes and is scheduled to begin chemotherapy but it was felt that decompression of the left kidney was indicated.   He is have increased voiding difficulty with passage of clots as well.    Results for Scott [name], Scott [name] (MRN [id number]) as of 07/10/2019 08:56  Ref. Range 11/28/2018 10:23 02/10/2019 13:25 03/19/2019 13:49 04/23/2019 13:58 06/23/2019 13:41  Prostatic Specific Antigen Latest Ref Range: 0.00 - 4.00 ng/mL 6.46 (H) 7.31 (H) 8.20 (H) 9.96 (H) 9.47 (H)     He has gotten his COVID vaccine.      ROS:  ROS:  A complete review of systems was performed.  All  systems are negative except for pertinent findings as noted.   ROS  Allergies  Allergen Reactions  . Wellbutrin [Bupropion] Hives    Outpatient Encounter Medications as of 12/04/2019  Medication Sig Note  . acetaminophen (TYLENOL) 500 MG tablet Take 1,000 mg by mouth at bedtime.   . ASTRAGALUS PO Take 1 tablet by mouth 2 (two) times daily.   Azucena Freed Serrata (BOSWELLIA PO) Take 1 capsule by mouth 2 (two) times daily.   . Cholecalciferol (D 5000) 5000 units capsule Take 5,000-10,000 Units by mouth See admin instructions. Take 10000 units in the morning and 5000 units at night   . cimetidine (TAGAMET) 200 MG tablet Take 200-400 mg by mouth daily as needed (inflammation). 11/09/2019:  Pt confirms he takes this as needed for inflammation  . DOCETAXEL IV Inject 75 mg/m2 into the vein every 21 ( twenty-one) days.    Marland Kitchen dutasteride (AVODART) 0.5 MG capsule Take 1 capsule (0.5 mg total) by mouth daily.   Marland Kitchen estradiol (VIVELLE-DOT) 0.1 MG/24HR patch Place 1 patch (0.1 mg total) onto the skin 2 (two) times a week. For management of hot flashes for ADT. (Patient taking differently: Place 1 patch onto the skin See admin instructions. Apply 1 patch once every 4 days For management of hot flashes for ADT.) 11/09/2019: Pt states he changes patches twice every 8 day, changing the patch once every 4 days   . HYDROcodone-acetaminophen (NORCO/VICODIN) 5-325 MG tablet Take 1 tablet by mouth every 4 (four) hours as needed for moderate pain.   Marland Kitchen ibuprofen (ADVIL) 200 MG tablet Take 400 mg by mouth every 6 (six) hours as needed for moderate pain.   Marland Kitchen Leuprolide Acetate, 6 Month, (LUPRON DEPOT, 24-MONTH,) 45 MG injection Inject 45 mg into the muscle every 6 (six) months.   Marland Kitchen MAGNESIUM PO Take 1 tablet by mouth at bedtime.   Marland Kitchen MELATONIN PO Take 40 mg by mouth at bedtime.   Marland Kitchen MILK THISTLE PO Take 1 capsule by mouth daily.   Marland Kitchen NATTOKINASE PO Take 2 capsules by mouth at bedtime.   . ondansetron (ZOFRAN ODT) 8 MG disintegrating tablet Take 1 tablet (8 mg total) by mouth every 8 (eight) hours as needed for nausea or vomiting.   Marland Kitchen OVER THE COUNTER MEDICATION Take 1 capsule by mouth 2 (two) times daily. [place] [employer] Mushroom  Anti-Cancer /Immune Scientist, product/process development    . OVER THE COUNTER MEDICATION Place 1 Dose under the tongue 2 (two) times daily. CBD oil daily    . OVER THE COUNTER MEDICATION Take 1 capsule by mouth daily. Gamma E otc supplement   . OVER THE COUNTER MEDICATION Take 1 capsule by mouth 2 (two) times daily. zyflamend otc supplement   . pegfilgrastim-jmdb (FULPHILA) 6 MG/0.6ML injection Inject 6 mg into the skin every 21 ( twenty-one) days. On Day 3 with each cycle of chemotherapy   . predniSONE (DELTASONE) 5  MG tablet Take 1 tablet (5 mg total) by mouth 2 (two) times daily with a meal. Take 1 tablet in the morning and 1 tablet at bedtime   . prochlorperazine (COMPAZINE) 10 MG tablet TAKE 1 TABLET(10 MG) BY MOUTH EVERY 6 HOURS AS NEEDED FOR NAUSEA OR VOMITING   . QUERCETIN PO Take 1 tablet by mouth 2 (two) times daily.   Marland Kitchen Resveratrol 250 MG CAPS Take 250 mg by mouth daily.   . vitamin B-12 (CYANOCOBALAMIN) 1000 MCG tablet Take 1,000 mcg by mouth daily.   Marland Kitchen VITAMIN K PO Take 1 capsule  by mouth daily.   . Zinc 30 MG CAPS Take 30 mg by mouth daily.    No facility-administered encounter medications on file as of 12/04/2019.    Past Medical History:  Diagnosis Date  . Bladder obstruction   . Dyspnea   . Essential hypertension, benign 01/26/2019   at [place] office elevated, normal at home, no medications at this time  . History of basal cell cancer   . HLD (hyperlipidemia) 01/26/2019  . Hypothyroidism    history of, not currently taking medication  . Prostate cancer ([place])   . Vitamin D deficiency disease 01/26/2019    Past Surgical History:  Procedure Laterality Date  . CYSTOSCOPY W/ URETERAL STENT PLACEMENT Left 11/10/2019   Procedure: CYSTOSCOPY;  Surgeon: Irine Seal, MD;  Location: [facility];  Service: Urology;  Laterality: Left;  . HERNIA REPAIR  [id number]   UMBILICAL   . PROSTATE SURGERY    . SKIN CANCER EXCISION    . TONSILLECTOMY Bilateral   . TRANSURETHRAL RESECTION OF PROSTATE N/A 01/25/2017   Procedure: TRANSURETHRAL RESECTION OF THE PROSTATE (TURP);  Surgeon: Alexis Frock, MD;  Location: [facility];  Service: Urology;  Laterality: N/A;  . TRANSURETHRAL RESECTION OF PROSTATE N/A 11/10/2019   Procedure: TRANSURETHRAL RESECTION OF THE PROSTATE (TURP);  Surgeon: Irine Seal, MD;  Location: [facility];  Service: Urology;  Laterality: N/A;  . WISDOM TOOTH EXTRACTION      Social History   Socioeconomic History  . Marital status: Married    Spouse name: Not on file  . Number of children: 1  .  Years of education: Not on file  . Highest education level: Not on file  Occupational History    Comment: working full time  Tobacco Use  . Smoking status: Former Smoker    Packs/day: 1.50    Years: 35.00    Pack years: 52.50    Types: Cigarettes    Quit date: 01/12/2004    Years since quitting: 15.9  . Smokeless tobacco: Never Used  . Tobacco comment: OVER 30 YEARS HX OF SMOKING   Vaping Use  . Vaping Use: Never used  Substance and Sexual Activity  . Alcohol use: No  . Drug use: Not Currently  . Sexual activity: Not Currently  Other Topics Concern  . Not on file  Social History Narrative   Married for 19 years.Works for [employer] from home.   Social Determinants of Health   Financial Resource Strain:   . Difficulty of Paying Living Expenses: Not on file  Food Insecurity:   . Worried About Charity fundraiser in the Last Year: Not on file  . Ran Out of Food in the Last Year: Not on file  Transportation Needs:   . Lack of Transportation (Medical): Not on file  . Lack of Transportation (Non-Medical): Not on file  Physical Activity:   . Days of Exercise per Week: Not on file  . Minutes of Exercise per Session: Not on file  Stress:   . Feeling of Stress : Not on file  Social Connections:   . Frequency of Communication with Friends and Family: Not on file  . Frequency of Social Gatherings with Friends and Family: Not on file  . Attends Religious Services: Not on file  . Active Member of Clubs or Organizations: Not on file  . Attends Archivist Meetings: Not on file  . Marital Status: Not on file  Intimate Partner Violence:   . Fear of Current or Ex-Partner:  Not on file  . Emotionally Abused: Not on file  . Physically Abused: Not on file  . Sexually Abused: Not on file    Family History  Problem Relation Age of Onset  . Emphysema Mother   . Alzheimer's disease Father   . Prostate cancer Father        dx in his 22s  . Prostate cancer Brother         dx in his 15s  . Breast cancer Maternal Grandmother   . Pancreatic cancer Maternal Grandfather   . Dementia Paternal Grandmother   . Heart attack Paternal Grandfather        Objective: Vitals:   12/04/19 0853  BP: (!) 167/96  Pulse: 61  Temp: 98.1 F (36.7 C)     Physical ExamI    Assessment & Plan: He has CRCP with locally advanced disease with left ureteral obstruction with AKI and he also has bone and lymph node metastases.   He is doing well s/p TURP with resection of the orifices and his Cr. Is down.  He has some probable left reflux.  I will get a renal US to get a new baseline.  F/U in 46mo.     Continue Lupron 45mg  q47mo and estradiol patches.   Testosterone and estradiol levels today.   Dr. Delton Coombes will get PSA's while he is on therapy.       No orders of the defined types were placed in this encounter.    Orders Placed This Encounter  Procedures  . US RENAL    Standing Status:   Future    Standing Expiration Date:   01/03/2020    Order Specific Question:   Reason for Exam (SYMPTOM  OR DIAGNOSIS REQUIRED)    Answer:   left hydronephrosis    Order Specific Question:   Preferred imaging location?    Answer:   [facility]  . Urinalysis, Routine w reflex microscopic  . Estradiol    Standing Status:   Future    Standing Expiration Date:   01/03/2020  . Testosterone    Standing Status:   Future    Standing Expiration Date:   01/03/2020      Return in about 3 months (around 03/04/2020).   CC: Doree Albee, MD, Dr. [name].     Irine Seal 12/04/2019

## 2019-12-04 ENCOUNTER — Encounter: Payer: Self-pay | Admitting: Urology

## 2019-12-04 ENCOUNTER — Other Ambulatory Visit: Payer: Self-pay

## 2019-12-04 ENCOUNTER — Other Ambulatory Visit: Payer: Self-pay | Admitting: *Deleted

## 2019-12-04 ENCOUNTER — Ambulatory Visit (INDEPENDENT_AMBULATORY_CARE_PROVIDER_SITE_OTHER): Payer: BC Managed Care – PPO | Admitting: Urology

## 2019-12-04 VITALS — BP 167/96 | HR 61 | Temp 98.1°F

## 2019-12-04 DIAGNOSIS — C61 Malignant neoplasm of prostate: Secondary | ICD-10-CM

## 2019-12-04 DIAGNOSIS — N135 Crossing vessel and stricture of ureter without hydronephrosis: Secondary | ICD-10-CM

## 2019-12-04 DIAGNOSIS — N403 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: Secondary | ICD-10-CM

## 2019-12-04 LAB — URINALYSIS, ROUTINE W REFLEX MICROSCOPIC
Bilirubin, UA: NEGATIVE
Glucose, UA: NEGATIVE
Ketones, UA: NEGATIVE
Nitrite, UA: NEGATIVE
Specific Gravity, UA: 1.02 (ref 1.005–1.030)
Urobilinogen, Ur: 0.2 mg/dL (ref 0.2–1.0)
pH, UA: 6 (ref 5.0–7.5)

## 2019-12-04 LAB — MICROSCOPIC EXAMINATION
Epithelial Cells (non renal): NONE SEEN /hpf (ref 0–10)
RBC, Urine: >30 /hpf — AB (ref 0–2)
Renal Epithel, UA: NONE SEEN /hpf

## 2019-12-04 NOTE — Progress Notes (Signed)
Urological Symptom Review  Patient is experiencing the following symptoms: Get up at night to urinate Blood in urine Erection problems (male only)   Review of Systems  Gastrointestinal (upper)  : Negative for upper GI symptoms  Gastrointestinal (lower) : Negative for lower GI symptoms  Constitutional : Fever Night Sweats  Skin: Negative for skin symptoms  Eyes: Negative for eye symptoms  Ear/Nose/Throat : Negative for Ear/Nose/Throat symptoms  Hematologic/Lymphatic: Negative for Hematologic/Lymphatic symptoms  Cardiovascular : Negative for cardiovascular symptoms  Respiratory : Negative for respiratory symptoms  Endocrine: Negative for endocrine symptoms  Musculoskeletal: Back pain Joint pain  Neurological: Negative for neurological symptoms  Psychologic: Negative for psychiatric symptoms

## 2019-12-05 LAB — TESTOSTERONE: Testosterone: <3 ng/dL — ABNORMAL LOW (ref 264–916)

## 2019-12-05 LAB — ESTRADIOL: Estradiol: 44.4 pg/mL — ABNORMAL HIGH (ref 7.6–42.6)

## 2019-12-07 ENCOUNTER — Other Ambulatory Visit: Payer: Self-pay

## 2019-12-07 ENCOUNTER — Inpatient Hospital Stay (HOSPITAL_COMMUNITY): Payer: BC Managed Care – PPO

## 2019-12-07 ENCOUNTER — Inpatient Hospital Stay (HOSPITAL_BASED_OUTPATIENT_CLINIC_OR_DEPARTMENT_OTHER): Payer: BC Managed Care – PPO | Admitting: Hematology

## 2019-12-07 VITALS — BP 144/69 | HR 49 | Temp 97.3°F | Resp 18

## 2019-12-07 VITALS — BP 133/70 | HR 57 | Temp 96.9°F | Resp 18 | Wt 171.7 lb

## 2019-12-07 DIAGNOSIS — C61 Malignant neoplasm of prostate: Secondary | ICD-10-CM | POA: Diagnosis not present

## 2019-12-07 DIAGNOSIS — Z5111 Encounter for antineoplastic chemotherapy: Secondary | ICD-10-CM | POA: Diagnosis not present

## 2019-12-07 LAB — CBC WITH DIFFERENTIAL/PLATELET
Abs Immature Granulocytes: 0.02 10*3/uL (ref 0.00–0.07)
Basophils Absolute: 0.2 10*3/uL — ABNORMAL HIGH (ref 0.0–0.1)
Basophils Relative: 4 %
Eosinophils Absolute: 0 10*3/uL (ref 0.0–0.5)
Eosinophils Relative: 1 %
HCT: 32.7 % — ABNORMAL LOW (ref 39.0–52.0)
Hemoglobin: 11.3 g/dL — ABNORMAL LOW (ref 13.0–17.0)
Immature Granulocytes: 1 %
Lymphocytes Relative: 40 %
Lymphs Abs: 1.6 10*3/uL (ref 0.7–4.0)
MCH: 31.8 pg (ref 26.0–34.0)
MCHC: 34.6 g/dL (ref 30.0–36.0)
MCV: 92.1 fL (ref 80.0–100.0)
Monocytes Absolute: 0.6 10*3/uL (ref 0.1–1.0)
Monocytes Relative: 14 %
Neutro Abs: 1.7 10*3/uL (ref 1.7–7.7)
Neutrophils Relative %: 40 %
Platelets: 362 10*3/uL (ref 150–400)
RBC: 3.55 MIL/uL — ABNORMAL LOW (ref 4.22–5.81)
RDW: 13.2 % (ref 11.5–15.5)
WBC: 4.1 10*3/uL (ref 4.0–10.5)
nRBC: 0 % (ref 0.0–0.2)

## 2019-12-07 LAB — COMPREHENSIVE METABOLIC PANEL
ALT: 16 U/L (ref 0–44)
AST: 15 U/L (ref 15–41)
Albumin: 3.8 g/dL (ref 3.5–5.0)
Alkaline Phosphatase: 51 U/L (ref 38–126)
Anion gap: 7 (ref 5–15)
BUN: 13 mg/dL (ref 8–23)
CO2: 23 mmol/L (ref 22–32)
Calcium: 9 mg/dL (ref 8.9–10.3)
Chloride: 104 mmol/L (ref 98–111)
Creatinine, Ser: 0.91 mg/dL (ref 0.61–1.24)
GFR calc Af Amer: >60 mL/min (ref >60)
GFR calc non Af Amer: >60 mL/min (ref >60)
Glucose, Bld: 110 mg/dL — ABNORMAL HIGH (ref 70–99)
Potassium: 3.8 mmol/L (ref 3.5–5.1)
Sodium: 134 mmol/L — ABNORMAL LOW (ref 135–145)
Total Bilirubin: 1.2 mg/dL (ref 0.3–1.2)
Total Protein: 6.3 g/dL — ABNORMAL LOW (ref 6.5–8.1)

## 2019-12-07 LAB — PSA: Prostatic Specific Antigen: 9.94 ng/mL — ABNORMAL HIGH (ref 0.00–4.00)

## 2019-12-07 MED ORDER — SODIUM CHLORIDE 0.9 % IV SOLN
10.0000 mg | Freq: Once | INTRAVENOUS | Status: AC
  Administered 2019-12-07: 10 mg via INTRAVENOUS
  Filled 2019-12-07: qty 10

## 2019-12-07 MED ORDER — SODIUM CHLORIDE 0.9 % IV SOLN
75.0000 mg/m2 | Freq: Once | INTRAVENOUS | Status: AC
  Administered 2019-12-07: 140 mg via INTRAVENOUS
  Filled 2019-12-07: qty 14

## 2019-12-07 MED ORDER — SODIUM CHLORIDE 0.9 % IV SOLN
Freq: Once | INTRAVENOUS | Status: AC
  Administered 2019-12-07: 8 mg via INTRAVENOUS
  Filled 2019-12-07: qty 4

## 2019-12-07 MED ORDER — ONDANSETRON HCL 4 MG/2ML IJ SOLN: 8.0000 mg | Freq: Once | INTRAMUSCULAR | Status: DC

## 2019-12-07 MED ORDER — SODIUM CHLORIDE 0.9 % IV SOLN: Freq: Once | INTRAVENOUS | Status: AC

## 2019-12-07 NOTE — Patient Instructions (Signed)
[facility] Discharge Instructions for Patients Receiving Chemotherapy  Today you received the following chemotherapy agents   To help prevent nausea and vomiting after your treatment, we encourage you to take your nausea medication   If you develop nausea and vomiting that is not controlled by your nausea medication, call the clinic.   BELOW ARE SYMPTOMS THAT SHOULD BE REPORTED IMMEDIATELY:  *FEVER GREATER THAN 100.5 F  *CHILLS WITH OR WITHOUT FEVER  NAUSEA AND VOMITING THAT IS NOT CONTROLLED WITH YOUR NAUSEA MEDICATION  *UNUSUAL SHORTNESS OF BREATH  *UNUSUAL BRUISING OR BLEEDING  TENDERNESS IN MOUTH AND THROAT WITH OR WITHOUT PRESENCE OF ULCERS  *URINARY PROBLEMS  *BOWEL PROBLEMS  UNUSUAL RASH Items with * indicate a potential emergency and should be followed up as soon as possible.  Feel free to call the clinic should you have any questions or concerns. The clinic phone number is [phone].  Please show the CHEMO ALERT CARD at check-in to the Emergency Department and triage nurse.   

## 2019-12-07 NOTE — Patient Instructions (Signed)
[facility] Discharge Instructions  You were seen today by Dr. Katragadda. He went over your recent results. You received your treatment today. Dr. Katragadda will see you back in 3 weeks for labs and follow up.   Thank you for choosing [facility] at [facility] to provide your oncology and hematology care.  To afford each patient quality time with our provider, please arrive at least 15 minutes before your scheduled appointment time.   If you have a lab appointment with the Cancer Center please come in thru the Main Entrance and check in at the main information desk  You need to re-schedule your appointment should you arrive 10 or more minutes late.  We strive to give you quality time with our providers, and arriving late affects you and other patients whose appointments are after yours.  Also, if you no show three or more times for appointments you may be dismissed from the clinic at the providers discretion.     Again, thank you for choosing [facility].  Our hope is that these requests will decrease the amount of time that you wait before being seen by our physicians.       _____________________________________________________________  Should you have questions after your visit to  Cancer Center, please contact our office at [phone] between the hours of 8:00 a.m. and 4:30 p.m.  Voicemails left after 4:00 p.m. will not be returned until the following business day.  For prescription refill requests, have your pharmacy contact our office and allow 72 hours.    Cancer Center Support Programs:   > Cancer Support Group  2nd Tuesday of the month 1pm-2pm, Journey Room    

## 2019-12-07 NOTE — Progress Notes (Signed)
Sent via mychart

## 2019-12-07 NOTE — Progress Notes (Signed)
[place], [place] [address]   CLINIC:  Medical Oncology/Hematology  PCP:  Doree Albee, MD [place] / [place] [address] [phone]   REASON FOR VISIT:  Follow-up for metastatic prostate cancer  PRIOR THERAPY: TURP on 11/10/2019  NGS Results: Not done  CURRENT THERAPY: Docetaxel every 3 weeks  BRIEF ONCOLOGIC HISTORY:  Oncology History  Prostate cancer metastatic to multiple sites [facility])  01/25/2017 Initial Diagnosis   Prostate cancer metastatic to multiple sites [facility])   11/16/2019 -  Chemotherapy   The patient had ondansetron (ZOFRAN) injection 8 mg, 8 mg (100 % of original dose 8 mg), Intravenous,  Once, 1 of 4 cycles Dose modification: 8 mg (original dose 8 mg, Cycle 1) pegfilgrastim-jmdb (FULPHILA) injection 6 mg, 6 mg, Subcutaneous,  Once, 1 of 4 cycles Administration: 6 mg (11/18/2019) DOCEtaxel (TAXOTERE) 140 mg in sodium chloride 0.9 % 250 mL chemo infusion, 75 mg/m2 = 140 mg, Intravenous,  Once, 1 of 4 cycles Administration: 140 mg (11/16/2019)  for chemotherapy treatment.      CANCER STAGING: Cancer Staging No matching staging information was found for the patient.  INTERVAL HISTORY:  [name], a 64 y.o. male, returns for routine follow-up and consideration for next cycle of chemotherapy. Brock was last seen on 11/16/2019.  Due for cycle #2 of docetaxel today.   Today he is accompanied by his wife. Overall, he tells me he has been feeling pretty well. He tolerated the treatment well and had numbness in his feet for approximately 3 days. He rotates taking Norco, ibuprofen and Tylenol for pain as needed, but prefers not to take Norco since it makes him constipated. His appetite is great. He denies leg swelling. He had mild bone pains when he received the Fulphila injection.  Overall, he feels ready for next cycle of chemo today.    REVIEW OF SYSTEMS:  Review of Systems  Constitutional: Positive  for fatigue (mild). Negative for appetite change.  Cardiovascular: Negative for leg swelling.  Gastrointestinal: Positive for constipation (d/t pain meds).  Neurological: Positive for numbness (feet x 3 days).  All other systems reviewed and are negative.   PAST MEDICAL/SURGICAL HISTORY:  Past Medical History:  Diagnosis Date  . Bladder obstruction   . Dyspnea   . Essential hypertension, benign 01/26/2019   at [place] office elevated, normal at home, no medications at this time  . History of basal cell cancer   . HLD (hyperlipidemia) 01/26/2019  . Hypothyroidism    history of, not currently taking medication  . Prostate cancer ([place])   . Vitamin D deficiency disease 01/26/2019   Past Surgical History:  Procedure Laterality Date  . CYSTOSCOPY W/ URETERAL STENT PLACEMENT Left 11/10/2019   Procedure: CYSTOSCOPY;  Surgeon: Irine Seal, MD;  Location: [facility];  Service: Urology;  Laterality: Left;  . HERNIA REPAIR  [id number]   UMBILICAL   . PROSTATE SURGERY    . SKIN CANCER EXCISION    . TONSILLECTOMY Bilateral   . TRANSURETHRAL RESECTION OF PROSTATE N/A 01/25/2017   Procedure: TRANSURETHRAL RESECTION OF THE PROSTATE (TURP);  Surgeon: Alexis Frock, MD;  Location: [facility];  Service: Urology;  Laterality: N/A;  . TRANSURETHRAL RESECTION OF PROSTATE N/A 11/10/2019   Procedure: TRANSURETHRAL RESECTION OF THE PROSTATE (TURP);  Surgeon: Irine Seal, MD;  Location: [facility];  Service: Urology;  Laterality: N/A;  . WISDOM TOOTH EXTRACTION      SOCIAL HISTORY:  Social History  Socioeconomic History  . Marital status: Married    Spouse name: Not on file  . Number of children: 1  . Years of education: Not on file  . Highest education level: Not on file  Occupational History    Comment: working full time  Tobacco Use  . Smoking status: Former Smoker    Packs/day: 1.50    Years: 35.00    Pack years: 52.50    Types: Cigarettes    Quit date: 01/12/2004    Years since quitting: 15.9  .  Smokeless tobacco: Never Used  . Tobacco comment: OVER 30 YEARS HX OF SMOKING   Vaping Use  . Vaping Use: Never used  Substance and Sexual Activity  . Alcohol use: No  . Drug use: Not Currently  . Sexual activity: Not Currently  Other Topics Concern  . Not on file  Social History Narrative   Married for 19 years.Works for [employer] from home.   Social Determinants of Health   Financial Resource Strain:   . Difficulty of Paying Living Expenses: Not on file  Food Insecurity:   . Worried About Charity fundraiser in the Last Year: Not on file  . Ran Out of Food in the Last Year: Not on file  Transportation Needs:   . Lack of Transportation (Medical): Not on file  . Lack of Transportation (Non-Medical): Not on file  Physical Activity:   . Days of Exercise per Week: Not on file  . Minutes of Exercise per Session: Not on file  Stress:   . Feeling of Stress : Not on file  Social Connections:   . Frequency of Communication with Friends and Family: Not on file  . Frequency of Social Gatherings with Friends and Family: Not on file  . Attends Religious Services: Not on file  . Active Member of Clubs or Organizations: Not on file  . Attends Archivist Meetings: Not on file  . Marital Status: Not on file  Intimate Partner Violence:   . Fear of Current or Ex-Partner: Not on file  . Emotionally Abused: Not on file  . Physically Abused: Not on file  . Sexually Abused: Not on file    FAMILY HISTORY:  Family History  Problem Relation Age of Onset  . Emphysema Mother   . Alzheimer's disease Father   . Prostate cancer Father        dx in his 85s  . Prostate cancer Brother        dx in his 38s  . Breast cancer Maternal Grandmother   . Pancreatic cancer Maternal Grandfather   . Dementia Paternal Grandmother   . Heart attack Paternal Grandfather     CURRENT MEDICATIONS:  Current Outpatient Medications  Medication Sig Dispense Refill  . acetaminophen (TYLENOL)  500 MG tablet Take 1,000 mg by mouth at bedtime.    . ASTRAGALUS PO Take 1 tablet by mouth 2 (two) times daily.    Azucena Freed Serrata (BOSWELLIA PO) Take 1 capsule by mouth 2 (two) times daily.    . Cholecalciferol (D 5000) 5000 units capsule Take 5,000-10,000 Units by mouth See admin instructions. Take 10000 units in the morning and 5000 units at night    . cimetidine (TAGAMET) 200 MG tablet Take 200-400 mg by mouth daily as needed (inflammation).    . DOCETAXEL IV Inject 75 mg/m2 into the vein every 21 ( twenty-one) days.     Marland Kitchen dutasteride (AVODART) 0.5 MG capsule Take 1 capsule (0.5 mg  total) by mouth daily. 30 capsule 11  . estradiol (VIVELLE-DOT) 0.1 MG/24HR patch Place 1 patch (0.1 mg total) onto the skin 2 (two) times a week. For management of hot flashes for ADT. (Patient taking differently: Place 1 patch onto the skin See admin instructions. Apply 1 patch once every 4 days For management of hot flashes for ADT.) 8 patch 11  . HYDROcodone-acetaminophen (NORCO/VICODIN) 5-325 MG tablet Take 1 tablet by mouth every 4 (four) hours as needed for moderate pain. 10 tablet 0  . ibuprofen (ADVIL) 200 MG tablet Take 400 mg by mouth every 6 (six) hours as needed for moderate pain.    Marland Kitchen Leuprolide Acetate, 6 Month, (LUPRON DEPOT, 109-MONTH,) 45 MG injection Inject 45 mg into the muscle every 6 (six) months.    Marland Kitchen MAGNESIUM PO Take 1 tablet by mouth at bedtime.    Marland Kitchen MELATONIN PO Take 40 mg by mouth at bedtime.    Marland Kitchen MILK THISTLE PO Take 1 capsule by mouth daily.    Marland Kitchen NATTOKINASE PO Take 2 capsules by mouth at bedtime.    . ondansetron (ZOFRAN ODT) 8 MG disintegrating tablet Take 1 tablet (8 mg total) by mouth every 8 (eight) hours as needed for nausea or vomiting. 20 tablet 3  . OVER THE COUNTER MEDICATION Take 1 capsule by mouth 2 (two) times daily. [place] [employer]  Anti-Cancer /Immune Scientist, product/process development     . OVER THE COUNTER MEDICATION Place 1 Dose under the tongue 2 (two) times daily. CBD oil daily     .  OVER THE COUNTER MEDICATION Take 1 capsule by mouth daily. Gamma E otc supplement    . OVER THE COUNTER MEDICATION Take 1 capsule by mouth 2 (two) times daily. zyflamend otc supplement    . pegfilgrastim-jmdb (FULPHILA) 6 MG/0.6ML injection Inject 6 mg into the skin every 21 ( twenty-one) days. On Day 3 with each cycle of chemotherapy    . predniSONE (DELTASONE) 5 MG tablet Take 1 tablet (5 mg total) by mouth 2 (two) times daily with a meal. Take 1 tablet in the morning and 1 tablet at bedtime 60 tablet 3  . prochlorperazine (COMPAZINE) 10 MG tablet TAKE 1 TABLET(10 MG) BY MOUTH EVERY 6 HOURS AS NEEDED FOR NAUSEA OR VOMITING 60 tablet 3  . QUERCETIN PO Take 1 tablet by mouth 2 (two) times daily.    Marland Kitchen Resveratrol 250 MG CAPS Take 250 mg by mouth daily.    . vitamin B-12 (CYANOCOBALAMIN) 1000 MCG tablet Take 1,000 mcg by mouth daily.    Marland Kitchen VITAMIN K PO Take 1 capsule by mouth daily.    . Zinc 30 MG CAPS Take 30 mg by mouth daily.     No current facility-administered medications for this visit.    ALLERGIES:  Allergies  Allergen Reactions  . Wellbutrin [Bupropion] Hives    PHYSICAL EXAM:  Performance status (ECOG): 0 - Asymptomatic  Vitals:   12/07/19 0847  BP: 133/70  Pulse: (!) 57  Resp: 18  Temp: (!) 96.9 F (36.1 C)  SpO2: 99%   Wt Readings from Last 3 Encounters:  12/07/19 171 lb 11.8 oz (77.9 kg)  11/16/19 168 lb (76.2 kg)  11/10/19 174 lb 12.8 oz (79.3 kg)   Physical Exam Vitals reviewed.  Constitutional:      Appearance: Normal appearance.  Cardiovascular:     Rate and Rhythm: Normal rate and regular rhythm.     Pulses: Normal pulses.     Heart sounds: Normal heart  sounds.  Pulmonary:     Effort: Pulmonary effort is normal.     Breath sounds: Normal breath sounds.  Musculoskeletal:     Right lower leg: No edema.     Left lower leg: No edema.  Neurological:     General: No focal deficit present.     Mental Status: He is alert and oriented to person, place, and  time.  Psychiatric:        Mood and Affect: Mood normal.        Behavior: Behavior normal.     LABORATORY DATA:  I have reviewed the labs as listed.  CBC Latest Ref Rng & Units 12/07/2019 11/16/2019 11/10/2019  WBC 4.0 - 10.5 K/uL 4.1 5.5 5.8  Hemoglobin 13.0 - 17.0 g/dL 11.3(L) 13.9 13.6  Hematocrit 39 - 52 % 32.7(L) 39.6 38.1(L)  Platelets 150 - 400 K/uL 362 299 251   CMP Latest Ref Rng & Units 12/07/2019 11/16/2019 11/03/2019  Glucose 70 - 99 mg/dL 110(H) 112(H) -  BUN 8 - 23 mg/dL 13 13 -  Creatinine 0.61 - 1.24 mg/dL 0.91 0.90 1.10  Sodium 135 - 145 mmol/L 134(L) 133(L) -  Potassium 3.5 - 5.1 mmol/L 3.8 3.8 -  Chloride 98 - 111 mmol/L 104 102 -  CO2 22 - 32 mmol/L 23 21(L) -  Calcium 8.9 - 10.3 mg/dL 9.0 9.4 -  Total Protein 6.5 - 8.1 g/dL 6.3(L) 7.2 -  Total Bilirubin 0.3 - 1.2 mg/dL 1.2 1.2 -  Alkaline Phos 38 - 126 U/L 51 65 -  AST 15 - 41 U/L 15 19 -  ALT 0 - 44 U/L 16 18 -    DIAGNOSTIC IMAGING:  I have independently reviewed the scans and discussed with the patient. No results found.   ASSESSMENT:  1. 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 Prominent left hydronephrosis and hydroureter noted. New sclerotic lesion in the left anterior vertebral body of L1. Mild left periaortic adenopathy increased from prior, short axis lymph node measuring 1 cm. -Last PSA increased to 15.8 on 09/28/2019. -As there is rapid progression of disease, I have recommended docetaxel chemotherapy. He is agreeable after long discussion. -Cystoscopy with transurethral resection of tumor in the posterior bladder neck, right and left walls of the bladder, right lobe of the prostate on 11/10/2019. -Plan was to do 6 cycles of docetaxel followed by abiraterone and prednisone. -Cycle 1 of docetaxel on 11/16/2019.  2. Left  hydroureteronephrosis: -CT scan showed prostate mass invading the posterior bladder and obstructing the ureter. Creatinine increased to 1.1.   PLAN:  1. 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 -He had aches and pains lasting up to 5 days after fulphila injection. -I have reviewed his LFTs which are within normal limits.  White count is 4.1 with ANC of 1.7. -PSA has improved to 9.94 from 20.6. -He will proceed with cycle 2 today without any dose modifications. -I will see him back in 3 weeks prior to cycle 3.  We will plan to repeat CT of the abdomen and pelvis and bone scan after cycle 3.  2. Left hydroureteronephrosis: -Creatinine is continuing to stay around 0.9.   Orders placed this encounter:  Orders Placed This Encounter  Procedures  . PSA     [name], MD  [place] [phone]   I, Milinda Antis, am acting as a scribe for Dr. Sanda Linger.  I, [name] MD, have reviewed the above documentation for accuracy and completeness, and I agree with the above.

## 2019-12-07 NOTE — Progress Notes (Signed)
Patient presents today for treatment and follow up visit with Dr. Delton Coombes. Labs within parameters for treatment. Total Bilirubin 1.2 today. Vital signs within parameters for treatment. Patient denies pain today. Patient denies any significant changes since his last visit. Patient states after his injection he experienced bone aches per patient's words. Patient did take antihistamines for several days and states that did help somewhat.   Message received from [place] RN/ Dr. Delton Coombes. Proceed with treatment. Add on a PSA level. PSA level added, lab notified.   Treatment given today per MD orders. Tolerated infusion without adverse affects. Vital signs stable. No complaints at this time. Discharged from clinic ambulatory in stable condition. Alert and oriented x 3. F/U with [facility] as scheduled.

## 2019-12-07 NOTE — Progress Notes (Signed)
His testosterone remains very low at <3.  The estradiol is up to 44.4 from 32.9 but is less than 2 points above the normal range which is 7.6 to 42.6.

## 2019-12-07 NOTE — Progress Notes (Signed)
Patient was assessed by Dr. Delton Coombes and labs have been reviewed.  Patient encouraged to take his claritin starting today for his bone pain after the growth factor. Patient is okay to proceed with treatment today. Primary RN and pharmacy aware.

## 2019-12-08 ENCOUNTER — Telehealth: Payer: Self-pay

## 2019-12-08 NOTE — Telephone Encounter (Signed)
Pt called asking if he could decrease Estradiol patch from 0.1 mg to 0.05mg  after he did some reading. Message sent to MD.

## 2019-12-09 ENCOUNTER — Inpatient Hospital Stay (HOSPITAL_COMMUNITY): Payer: BC Managed Care – PPO

## 2019-12-09 ENCOUNTER — Other Ambulatory Visit: Payer: Self-pay

## 2019-12-09 ENCOUNTER — Encounter (HOSPITAL_COMMUNITY): Payer: Self-pay

## 2019-12-09 VITALS — BP 144/87 | HR 57 | Temp 98.6°F | Resp 18

## 2019-12-09 DIAGNOSIS — C61 Malignant neoplasm of prostate: Secondary | ICD-10-CM

## 2019-12-09 DIAGNOSIS — Z5111 Encounter for antineoplastic chemotherapy: Secondary | ICD-10-CM | POA: Diagnosis not present

## 2019-12-09 MED ORDER — ESTRADIOL 0.05 MG/24HR TD PTTW: 1.0000 | MEDICATED_PATCH | TRANSDERMAL | 12 refills | Status: DC

## 2019-12-09 MED ORDER — PEGFILGRASTIM-JMDB 6 MG/0.6ML ~~LOC~~ SOSY
6.0000 mg | PREFILLED_SYRINGE | Freq: Once | SUBCUTANEOUS | Status: AC
  Administered 2019-12-09: 6 mg via SUBCUTANEOUS

## 2019-12-09 NOTE — Progress Notes (Signed)
Scott [name] presents today for injection per the provider's orders.  Fulphila administration without incident; injection site WNL; see MAR for injection details.  Patient tolerated procedure well and without incident.  No questions or complaints noted at this time.  Discharged ambulatory in stable condition.

## 2019-12-09 NOTE — Telephone Encounter (Signed)
We can change him to the 0.05 patches.   Refill his script with the lower dose.  Otherwise the same.

## 2019-12-09 NOTE — Telephone Encounter (Signed)
New rx sent in. Pt notified via my chart.

## 2019-12-11 ENCOUNTER — Other Ambulatory Visit: Payer: Self-pay

## 2019-12-11 ENCOUNTER — Ambulatory Visit (HOSPITAL_COMMUNITY)
Admission: RE | Admit: 2019-12-11 | Discharge: 2019-12-11 | Disposition: A | Discharge status: Home / Self Care | Payer: BC Managed Care – PPO | Source: Ambulatory Visit | Attending: Urology | Admitting: Urology

## 2019-12-11 DIAGNOSIS — C61 Malignant neoplasm of prostate: Secondary | ICD-10-CM | POA: Insufficient documentation

## 2019-12-11 DIAGNOSIS — N135 Crossing vessel and stricture of ureter without hydronephrosis: Secondary | ICD-10-CM | POA: Insufficient documentation

## 2019-12-14 NOTE — Progress Notes (Signed)
Results sent via my chart 

## 2019-12-28 ENCOUNTER — Inpatient Hospital Stay (HOSPITAL_COMMUNITY): Payer: BC Managed Care – PPO | Attending: Hematology

## 2019-12-28 ENCOUNTER — Inpatient Hospital Stay (HOSPITAL_BASED_OUTPATIENT_CLINIC_OR_DEPARTMENT_OTHER): Payer: BC Managed Care – PPO | Admitting: Hematology

## 2019-12-28 ENCOUNTER — Other Ambulatory Visit: Payer: Self-pay

## 2019-12-28 ENCOUNTER — Inpatient Hospital Stay (HOSPITAL_COMMUNITY): Payer: BC Managed Care – PPO

## 2019-12-28 VITALS — BP 165/74 | HR 47 | Temp 96.7°F | Resp 18

## 2019-12-28 VITALS — BP 145/70 | HR 54 | Temp 96.6°F | Resp 18 | Wt 175.8 lb

## 2019-12-28 DIAGNOSIS — Z5111 Encounter for antineoplastic chemotherapy: Secondary | ICD-10-CM | POA: Insufficient documentation

## 2019-12-28 DIAGNOSIS — Z800 Family history of malignant neoplasm of digestive organs: Secondary | ICD-10-CM | POA: Insufficient documentation

## 2019-12-28 DIAGNOSIS — E559 Vitamin D deficiency, unspecified: Secondary | ICD-10-CM | POA: Insufficient documentation

## 2019-12-28 DIAGNOSIS — Z8042 Family history of malignant neoplasm of prostate: Secondary | ICD-10-CM | POA: Diagnosis not present

## 2019-12-28 DIAGNOSIS — Z79899 Other long term (current) drug therapy: Secondary | ICD-10-CM | POA: Insufficient documentation

## 2019-12-28 DIAGNOSIS — Z85828 Personal history of other malignant neoplasm of skin: Secondary | ICD-10-CM | POA: Insufficient documentation

## 2019-12-28 DIAGNOSIS — C61 Malignant neoplasm of prostate: Secondary | ICD-10-CM

## 2019-12-28 DIAGNOSIS — E785 Hyperlipidemia, unspecified: Secondary | ICD-10-CM | POA: Diagnosis not present

## 2019-12-28 DIAGNOSIS — Z5189 Encounter for other specified aftercare: Secondary | ICD-10-CM | POA: Insufficient documentation

## 2019-12-28 DIAGNOSIS — Z23 Encounter for immunization: Secondary | ICD-10-CM | POA: Diagnosis not present

## 2019-12-28 DIAGNOSIS — I10 Essential (primary) hypertension: Secondary | ICD-10-CM | POA: Insufficient documentation

## 2019-12-28 DIAGNOSIS — Z87891 Personal history of nicotine dependence: Secondary | ICD-10-CM | POA: Insufficient documentation

## 2019-12-28 DIAGNOSIS — C7951 Secondary malignant neoplasm of bone: Secondary | ICD-10-CM | POA: Insufficient documentation

## 2019-12-28 DIAGNOSIS — R232 Flushing: Secondary | ICD-10-CM | POA: Diagnosis not present

## 2019-12-28 DIAGNOSIS — E039 Hypothyroidism, unspecified: Secondary | ICD-10-CM | POA: Insufficient documentation

## 2019-12-28 DIAGNOSIS — Z803 Family history of malignant neoplasm of breast: Secondary | ICD-10-CM | POA: Insufficient documentation

## 2019-12-28 LAB — CBC WITH DIFFERENTIAL/PLATELET
Abs Immature Granulocytes: 0.02 10*3/uL (ref 0.00–0.07)
Basophils Absolute: 0.2 10*3/uL — ABNORMAL HIGH (ref 0.0–0.1)
Basophils Relative: 2 %
Eosinophils Absolute: 0.1 10*3/uL (ref 0.0–0.5)
Eosinophils Relative: 1 %
HCT: 34.9 % — ABNORMAL LOW (ref 39.0–52.0)
Hemoglobin: 12 g/dL — ABNORMAL LOW (ref 13.0–17.0)
Immature Granulocytes: 0 %
Lymphocytes Relative: 35 %
Lymphs Abs: 2.2 10*3/uL (ref 0.7–4.0)
MCH: 31.8 pg (ref 26.0–34.0)
MCHC: 34.4 g/dL (ref 30.0–36.0)
MCV: 92.6 fL (ref 80.0–100.0)
Monocytes Absolute: 0.9 10*3/uL (ref 0.1–1.0)
Monocytes Relative: 14 %
Neutro Abs: 3.1 10*3/uL (ref 1.7–7.7)
Neutrophils Relative %: 48 %
Platelets: 304 10*3/uL (ref 150–400)
RBC: 3.77 MIL/uL — ABNORMAL LOW (ref 4.22–5.81)
RDW: 13.9 % (ref 11.5–15.5)
WBC: 6.4 10*3/uL (ref 4.0–10.5)
nRBC: 0 % (ref 0.0–0.2)

## 2019-12-28 LAB — COMPREHENSIVE METABOLIC PANEL
ALT: 18 U/L (ref 0–44)
AST: 15 U/L (ref 15–41)
Albumin: 3.9 g/dL (ref 3.5–5.0)
Alkaline Phosphatase: 49 U/L (ref 38–126)
Anion gap: 9 (ref 5–15)
BUN: 15 mg/dL (ref 8–23)
CO2: 24 mmol/L (ref 22–32)
Calcium: 9.2 mg/dL (ref 8.9–10.3)
Chloride: 101 mmol/L (ref 98–111)
Creatinine, Ser: 0.93 mg/dL (ref 0.61–1.24)
GFR, Estimated: >60 mL/min (ref >60)
Glucose, Bld: 96 mg/dL (ref 70–99)
Potassium: 3.9 mmol/L (ref 3.5–5.1)
Sodium: 134 mmol/L — ABNORMAL LOW (ref 135–145)
Total Bilirubin: 1.1 mg/dL (ref 0.3–1.2)
Total Protein: 6.6 g/dL (ref 6.5–8.1)

## 2019-12-28 LAB — PSA: Prostatic Specific Antigen: 5.74 ng/mL — ABNORMAL HIGH (ref 0.00–4.00)

## 2019-12-28 MED ORDER — INFLUENZA VAC SPLIT QUAD 0.5 ML IM SUSY
0.5000 mL | PREFILLED_SYRINGE | Freq: Once | INTRAMUSCULAR | Status: AC
  Administered 2019-12-28: 0.5 mL via INTRAMUSCULAR

## 2019-12-28 MED ORDER — ONDANSETRON HCL 4 MG/2ML IJ SOLN: 8.0000 mg | Freq: Once | INTRAMUSCULAR | Status: DC

## 2019-12-28 MED ORDER — SODIUM CHLORIDE 0.9 % IV SOLN: Freq: Once | INTRAVENOUS | Status: AC

## 2019-12-28 MED ORDER — HEPARIN SOD (PORK) LOCK FLUSH 100 UNIT/ML IV SOLN: 500.0000 [IU] | Freq: Once | INTRAVENOUS | Status: DC | PRN

## 2019-12-28 MED ORDER — SODIUM CHLORIDE 0.9 % IV SOLN
10.0000 mg | Freq: Once | INTRAVENOUS | Status: AC
  Administered 2019-12-28: 10 mg via INTRAVENOUS
  Filled 2019-12-28: qty 10

## 2019-12-28 MED ORDER — INFLUENZA VAC SPLIT QUAD 0.5 ML IM SUSY
PREFILLED_SYRINGE | INTRAMUSCULAR | Status: AC
  Filled 2019-12-28: qty 0.5

## 2019-12-28 MED ORDER — SODIUM CHLORIDE 0.9 % IV SOLN
Freq: Once | INTRAVENOUS | Status: AC
  Administered 2019-12-28: 8 mg via INTRAVENOUS
  Filled 2019-12-28: qty 4

## 2019-12-28 MED ORDER — SODIUM CHLORIDE 0.9 % IV SOLN
75.0000 mg/m2 | Freq: Once | INTRAVENOUS | Status: AC
  Administered 2019-12-28: 140 mg via INTRAVENOUS
  Filled 2019-12-28: qty 14

## 2019-12-28 MED ORDER — SODIUM CHLORIDE 0.9% FLUSH: 10.0000 mL | INTRAVENOUS | Status: DC | PRN

## 2019-12-28 NOTE — Progress Notes (Unsigned)
Pt complaining of arm burring.  Paused taxotere.  No blood return noted in IV hub.  Increased saline to 200 mL per hour.  Pt states that arm feels better.  No redness or swelling noted at IV site or beyond IV site.  Pt educated to call if burning starts again.  Verbalized understanding.

## 2019-12-28 NOTE — Progress Notes (Signed)
[name], Scott [name]   CLINIC:  Medical Oncology/Hematology  PCP:  Doree Albee, MD [address] / [place] [address] [phone]   REASON FOR VISIT:  Follow-up for metastatic prostate cancer  PRIOR THERAPY: TURP on 11/10/2019  NGS Results: Not done  CURRENT THERAPY: Docetaxel every 3 weeks  BRIEF ONCOLOGIC HISTORY:  Oncology History  Prostate cancer metastatic to multiple sites [facility])  01/25/2017 Initial Diagnosis   Prostate cancer metastatic to multiple sites [facility])   11/16/2019 -  Chemotherapy   The patient had ondansetron (ZOFRAN) injection 8 mg, 8 mg (100 % of original dose 8 mg), Intravenous,  Once, 2 of 4 cycles Dose modification: 8 mg (original dose 8 mg, Cycle 1) pegfilgrastim-jmdb (FULPHILA) injection 6 mg, 6 mg, Subcutaneous,  Once, 2 of 4 cycles Administration: 6 mg (11/18/2019), 6 mg (12/09/2019) DOCEtaxel (TAXOTERE) 140 mg in sodium chloride 0.9 % 250 mL chemo infusion, 75 mg/m2 = 140 mg, Intravenous,  Once, 2 of 4 cycles Administration: 140 mg (11/16/2019), 140 mg (12/07/2019)  for chemotherapy treatment.      CANCER STAGING: Cancer Staging No matching staging information was found for the patient.  INTERVAL HISTORY:  Scott [name], a 64 y.o. male, returns for routine follow-up and consideration for next cycle of chemotherapy. Scott [name] was last seen on 12/07/2019.  Due for cycle #3 of docetaxel today.   Today he is accompanied by his wife. Overall, he tells me he has been feeling pretty well. He tolerated the previous treatment well. He continues using the estradiol patch daily. He reports feeling like his tongue is scalded, but denies having any issues with his appetite of trouble swallowing. He denies having any numbess or tingling, N/V, or extreme fatigue.  He is tolerating his ADL's and is working from home.   Overall, he feels ready for next cycle of chemo today.    REVIEW OF SYSTEMS:  Review  of Systems  Constitutional: Positive for fatigue (75%). Negative for appetite change.  HENT:   Negative for trouble swallowing.   Gastrointestinal: Negative for nausea and vomiting.  Neurological: Negative for numbness.  All other systems reviewed and are negative.   PAST MEDICAL/SURGICAL HISTORY:  Past Medical History:  Diagnosis Date  . Bladder obstruction   . Dyspnea   . Essential hypertension, benign 01/26/2019   at [place] office elevated, normal at home, no medications at this time  . History of basal cell cancer   . HLD (hyperlipidemia) 01/26/2019  . Hypothyroidism    history of, not currently taking medication  . Prostate cancer ([place])   . Vitamin D deficiency disease 01/26/2019   Past Surgical History:  Procedure Laterality Date  . CYSTOSCOPY W/ URETERAL STENT PLACEMENT Left 11/10/2019   Procedure: CYSTOSCOPY;  Surgeon: Irine Seal, MD;  Location: [facility];  Service: Urology;  Laterality: Left;  . HERNIA REPAIR  2703   UMBILICAL   . PROSTATE SURGERY    . SKIN CANCER EXCISION    . TONSILLECTOMY Bilateral   . TRANSURETHRAL RESECTION OF PROSTATE N/A 01/25/2017   Procedure: TRANSURETHRAL RESECTION OF THE PROSTATE (TURP);  Surgeon: Alexis Frock, MD;  Location: [facility];  Service: Urology;  Laterality: N/A;  . TRANSURETHRAL RESECTION OF PROSTATE N/A 11/10/2019   Procedure: TRANSURETHRAL RESECTION OF THE PROSTATE (TURP);  Surgeon: Irine Seal, MD;  Location: [facility];  Service: Urology;  Laterality: N/A;  . WISDOM TOOTH EXTRACTION      SOCIAL HISTORY:  Social History   Socioeconomic History  . Marital status: Married    Spouse name: Not on file  . Number of children: 1  . Years of education: Not on file  . Highest education level: Not on file  Occupational History    Comment: working full time  Tobacco Use  . Smoking status: Former Smoker    Packs/day: 1.50    Years: 35.00    Pack years: 52.50    Types: Cigarettes    Quit date: 01/12/2004    Years since quitting:  15.9  . Smokeless tobacco: Never Used  . Tobacco comment: OVER 30 YEARS HX OF SMOKING   Vaping Use  . Vaping Use: Never used  Substance and Sexual Activity  . Alcohol use: No  . Drug use: Not Currently  . Sexual activity: Not Currently  Other Topics Concern  . Not on file  Social History Narrative   Married for 19 years.Works for [employer] from home.   Social Determinants of Health   Financial Resource Strain:   . Difficulty of Paying Living Expenses: Not on file  Food Insecurity:   . Worried About Charity fundraiser in the Last Year: Not on file  . Ran Out of Food in the Last Year: Not on file  Transportation Needs:   . Lack of Transportation (Medical): Not on file  . Lack of Transportation (Non-Medical): Not on file  Physical Activity:   . Days of Exercise per Week: Not on file  . Minutes of Exercise per Session: Not on file  Stress:   . Feeling of Stress : Not on file  Social Connections:   . Frequency of Communication with Friends and Family: Not on file  . Frequency of Social Gatherings with Friends and Family: Not on file  . Attends Religious Services: Not on file  . Active Member of Clubs or Organizations: Not on file  . Attends Archivist Meetings: Not on file  . Marital Status: Not on file  Intimate Partner Violence:   . Fear of Current or Ex-Partner: Not on file  . Emotionally Abused: Not on file  . Physically Abused: Not on file  . Sexually Abused: Not on file    FAMILY HISTORY:  Family History  Problem Relation Age of Onset  . Emphysema Mother   . Alzheimer's disease Father   . Prostate cancer Father        dx in his 47s  . Prostate cancer Brother        dx in his 51s  . Breast cancer Maternal Grandmother   . Pancreatic cancer Maternal Grandfather   . Dementia Paternal Grandmother   . Heart attack Paternal Grandfather     CURRENT MEDICATIONS:  Current Outpatient Medications  Medication Sig Dispense Refill  . acetaminophen  (TYLENOL) 500 MG tablet Take 1,000 mg by mouth at bedtime.    . ASTRAGALUS PO Take 1 tablet by mouth 2 (two) times daily.    Azucena Freed Serrata (BOSWELLIA PO) Take 1 capsule by mouth 2 (two) times daily.    . Cholecalciferol (D 5000) 5000 units capsule Take 5,000-10,000 Units by mouth See admin instructions. Take 10000 units in the morning and 5000 units at night    . cimetidine (TAGAMET) 200 MG tablet Take 200-400 mg by mouth daily as needed (inflammation).    . DOCETAXEL IV Inject 75 mg/m2 into the vein every 21 ( twenty-one) days.     Marland Kitchen dutasteride (AVODART) 0.5 MG capsule Take  1 capsule (0.5 mg total) by mouth daily. 30 capsule 11  . estradiol (VIVELLE-DOT) 0.05 MG/24HR patch Place 1 patch (0.05 mg total) onto the skin 2 (two) times a week. For management of hot flashes for ADT. Patient taking differently: Place 1 patch onto skin. Apply 1 patch once every 4 days for management of hot flashes for ADT. 8 patch 12  . HYDROcodone-acetaminophen (NORCO/VICODIN) 5-325 MG tablet Take 1 tablet by mouth every 4 (four) hours as needed for moderate pain. 10 tablet 0  . ibuprofen (ADVIL) 200 MG tablet Take 400 mg by mouth every 6 (six) hours as needed for moderate pain.    Marland Kitchen Leuprolide Acetate, 6 Month, (LUPRON DEPOT, 67-MONTH,) 45 MG injection Inject 45 mg into the muscle every 6 (six) months.    Marland Kitchen MAGNESIUM PO Take 1 tablet by mouth at bedtime.    Marland Kitchen MELATONIN PO Take 40 mg by mouth at bedtime.    Marland Kitchen MILK THISTLE PO Take 1 capsule by mouth daily.    . Misc Natural Products (TURMERIC CURCUMIN) CAPS Take 800 capsules by mouth 2 (two) times daily.    Marland Kitchen NATTOKINASE PO Take 2 capsules by mouth at bedtime.    Marland Kitchen OVER THE COUNTER MEDICATION Take 1 capsule by mouth 2 (two) times daily. [place] [employer] Mush[employer]  Anti-Cancer /Immune Scientist, product/process development     . OVER THE COUNTER MEDICATION Place 1 Dose under the tongue 2 (two) times daily. CBD oil daily     . OVER THE COUNTER MEDICATION Take 1 capsule by mouth daily. Gamma E otc  supplement    . OVER THE COUNTER MEDICATION Take 1 capsule by mouth 2 (two) times daily. zyflamend otc supplement    . pegfilgrastim-jmdb (FULPHILA) 6 MG/0.6ML injection Inject 6 mg into the skin every 21 ( twenty-one) days. On Day 3 with each cycle of chemotherapy    . predniSONE (DELTASONE) 5 MG tablet Take 1 tablet (5 mg total) by mouth 2 (two) times daily with a meal. Take 1 tablet in the morning and 1 tablet at bedtime 60 tablet 3  . prochlorperazine (COMPAZINE) 10 MG tablet TAKE 1 TABLET(10 MG) BY MOUTH EVERY 6 HOURS AS NEEDED FOR NAUSEA OR VOMITING 60 tablet 3  . QUERCETIN PO Take 1 tablet by mouth 2 (two) times daily.    Marland Kitchen Resveratrol 250 MG CAPS Take 250 mg by mouth daily.    . vitamin B-12 (CYANOCOBALAMIN) 1000 MCG tablet Take 1,000 mcg by mouth daily.    Marland Kitchen VITAMIN K PO Take 1 capsule by mouth daily.    . Zinc 30 MG CAPS Take 30 mg by mouth daily.    . ondansetron (ZOFRAN ODT) 8 MG disintegrating tablet Take 1 tablet (8 mg total) by mouth every 8 (eight) hours as needed for nausea or vomiting. (Patient not taking: Reported on 12/28/2019) 20 tablet 3   Current Facility-Administered Medications  Medication Dose Route Frequency Provider Last Rate Last Admin  . influenza vac split quadrivalent PF (FLUARIX) injection 0.5 mL  0.5 mL Intramuscular Once [name], MD        ALLERGIES:  Allergies  Allergen Reactions  . Wellbutrin [Bupropion] Hives    PHYSICAL EXAM:  Performance status (ECOG): 0 - Asymptomatic  Vitals:   12/28/19 1021  BP: (!) 145/70  Pulse: (!) 54  Resp: 18  Temp: (!) 96.6 F (35.9 C)  SpO2: 100%   Wt Readings from Last 3 Encounters:  12/28/19 175 lb 12.8 oz (79.7 kg)  12/07/19 171 lb  11.8 oz (77.9 kg)  11/16/19 168 lb (76.2 kg)   Physical Exam Vitals reviewed.  Constitutional:      Appearance: Normal appearance.  HENT:     Mouth/Throat:     Lips: No lesions.     Mouth: No oral lesions.     Dentition: No gum lesions.     Tongue: No  lesions.     Palate: No mass.     Pharynx: No pharyngeal swelling or posterior oropharyngeal erythema.     Tonsils: No tonsillar exudate.  Cardiovascular:     Rate and Rhythm: Normal rate and regular rhythm.     Pulses: Normal pulses.     Heart sounds: Normal heart sounds.  Pulmonary:     Effort: Pulmonary effort is normal.     Breath sounds: Normal breath sounds.  Musculoskeletal:     Right lower leg: No edema.     Left lower leg: No edema.  Neurological:     General: No focal deficit present.     Mental Status: He is alert and oriented to person, place, and time.  Psychiatric:        Mood and Affect: Mood normal.        Behavior: Behavior normal.     LABORATORY DATA:  I have reviewed the labs as listed.  CBC Latest Ref Rng & Units 12/28/2019 12/07/2019 11/16/2019  WBC 4.0 - 10.5 K/uL 6.4 4.1 5.5  Hemoglobin 13.0 - 17.0 g/dL 12.0(L) 11.3(L) 13.9  Hematocrit 39 - 52 % 34.9(L) 32.7(L) 39.6  Platelets 150 - 400 K/uL 304 362 299   CMP Latest Ref Rng & Units 12/28/2019 12/07/2019 11/16/2019  Glucose 70 - 99 mg/dL 96 110(H) 112(H)  BUN 8 - 23 mg/dL 15 13 13   Creatinine 0.61 - 1.24 mg/dL 0.93 0.91 0.90  Sodium 135 - 145 mmol/L 134(L) 134(L) 133(L)  Potassium 3.5 - 5.1 mmol/L 3.9 3.8 3.8  Chloride 98 - 111 mmol/L 101 104 102  CO2 22 - 32 mmol/L 24 23 21(L)  Calcium 8.9 - 10.3 mg/dL 9.2 9.0 9.4  Total Protein 6.5 - 8.1 g/dL 6.6 6.3(L) 7.2  Total Bilirubin 0.3 - 1.2 mg/dL 1.1 1.2 1.2  Alkaline Phos 38 - 126 U/L 49 51 65  AST 15 - 41 U/L 15 15 19   ALT 0 - 44 U/L 18 16 18     DIAGNOSTIC IMAGING:  I have independently reviewed the scans and discussed with the patient. US RENAL  Result Date: 12/11/2019 CLINICAL DATA:  Follow-up examination for left-sided hydronephrosis. EXAM: RENAL / URINARY TRACT ULTRASOUND COMPLETE COMPARISON:  Prior CT from 11/03/2019. FINDINGS: Right Kidney: Renal measurements: 10.4 x 6.0 x 5.3 cm = volume: 174 mL. Renal echogenicity within normal limits. No  nephrolithiasis or hydronephrosis. No focal renal mass. Left Kidney: Renal measurements: 9.9 x 5.9 x 4.2 cm = volume: 128 mL. Renal echogenicity within normal limits. No nephrolithiasis. Previously seen left-sided hydronephrosis has resolved. A prominent extrarenal pelvis measuring up to 2.2 cm noted both pre and post void. No focal renal mass. Bladder: Appears normal for degree of bladder distention. Previously seen soft tissue mass at the posterior bladder lumen not evident on today's exam. Bilateral ureteral jets are visualized. Other: None available. IMPRESSION: 1. Interval resolution of previously seen left-sided hydronephrosis. 2. Previously seen soft tissue mass at the posterior bladder lumen no longer seen, compatible with interval resection. Both ureteral jets are robust and well visualized within the bladder lumen. Electronically Signed   By: Jeannine Boga  M.D.   On: 12/11/2019 23:14     ASSESSMENT:  1. 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 Prominent left hydronephrosis and hydroureter noted. New sclerotic lesion in the left anterior vertebral body of L1. Mild left periaortic adenopathy increased from prior, short axis lymph node measuring 1 cm. -Last PSA increased to 15.8 on 09/28/2019. -As there is rapid progression of disease, I have recommended docetaxel chemotherapy. He is agreeable after long discussion. -Cystoscopy with transurethral resection of tumor in the posterior bladder neck, right and left walls of the bladder, right lobe of the prostate on 11/10/2019. -Plan was to do 6 cycles of docetaxel followed by abiraterone and prednisone. -Cycle 1 of docetaxel on 11/16/2019.  2. Left hydroureteronephrosis: -CT scan showed prostate mass invading the posterior bladder and obstructing the ureter. Creatinine  increased to 1.1. -Renal ultrasound on 12/11/2019 showed interval resolution of previously seen left-sided hydronephrosis.  Previously seen soft tissue mass at the posterior bladder lumen no longer seen.   PLAN:  1. 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  He had some pains in the lower extremities after growth factor injection.  He is taking Claritin, ibuprofen and hydrocodone for 4 to 5 days when the pain is severe. -Reviewed his labs.  PSA continues to trend down.  Today it is 5.74. -He is getting a good response.  I have reviewed CBC which showed normal white count and platelet count. -We will proceed with his cycle 3 today.  Plan to repeat bone scan and CTAP prior to next visit in 3 weeks.  2. Left hydroureteronephrosis: -This has improved on the recent ultrasound on 12/11/2019. -Creatinine is staying stable at 0.9.  3.  Hot flashes: -He has dose reduced estradiol patch to 0.05 mcg.   Orders placed this encounter:  Orders Placed This Encounter  Procedures  . NM Bone Scan Whole Body  . CT Abdomen Pelvis W Contrast     [name], MD [place] [phone]   I, Milinda Antis, am acting as a scribe for Dr. Sanda Linger.  I, [name] MD, have reviewed the above documentation for accuracy and completeness, and I agree with the above.

## 2019-12-28 NOTE — Patient Instructions (Signed)
[place] at [facility] Discharge Instructions  You were seen today by Dr. Delton Coombes. He went over your recent results. You received your treatment and flu shot today. You will be scheduled for a CT scan of your abdomen and bone scan before your next visit. You may proceed with getting a [place] booster vaccine. Dr. Delton Coombes will see you back in 3 weeks for labs and follow up.   Thank you for choosing [place] at [facility] to provide your oncology and hematology care.  To afford each patient quality time with our provider, please arrive at least 15 minutes before your scheduled appointment time.   If you have a lab appointment with the [facility] please come in thru the Main Entrance and check in at the main information desk  You need to re-schedule your appointment should you arrive 10 or more minutes late.  We strive to give you quality time with our providers, and arriving late affects you and other patients whose appointments are after yours.  Also, if you no show three or more times for appointments you may be dismissed from the clinic at the providers discretion.     Again, thank you for choosing [facility].  Our hope is that these requests will decrease the amount of time that you wait before being seen by our physicians.       _____________________________________________________________  Should you have questions after your visit to [facility], please contact our office at [phone] between the hours of 8:00 a.m. and 4:30 p.m.  Voicemails left after 4:00 p.m. will not be returned until the following business day.  For prescription refill requests, have your pharmacy contact our office and allow 72 hours.    Cancer Center Support Programs:   > Cancer Support Group  2nd Tuesday of the month 1pm-2pm, Journey Room

## 2019-12-28 NOTE — Progress Notes (Signed)
Patient was assessed by Dr. Katragadda and labs have been reviewed.  Patient is okay to proceed with treatment today. Primary RN and pharmacy aware.   

## 2019-12-28 NOTE — Progress Notes (Unsigned)
Labs are good for treatment today per MD. Proceed as planned.  Treatment given per orders. Patient tolerated it well without problems. Vitals stable and discharged home from clinic ambulatory in stable condition. Follow up as scheduled.

## 2019-12-30 ENCOUNTER — Inpatient Hospital Stay (HOSPITAL_COMMUNITY): Payer: BC Managed Care – PPO

## 2019-12-30 ENCOUNTER — Other Ambulatory Visit: Payer: Self-pay

## 2019-12-30 ENCOUNTER — Encounter (HOSPITAL_COMMUNITY): Payer: Self-pay

## 2019-12-30 VITALS — BP 145/73 | HR 64 | Temp 98.3°F | Resp 18

## 2019-12-30 DIAGNOSIS — C61 Malignant neoplasm of prostate: Secondary | ICD-10-CM

## 2019-12-30 MED ORDER — PEGFILGRASTIM-JMDB 6 MG/0.6ML ~~LOC~~ SOSY
PREFILLED_SYRINGE | SUBCUTANEOUS | Status: AC
  Filled 2019-12-30: qty 0.6

## 2019-12-30 MED ORDER — PEGFILGRASTIM-JMDB 6 MG/0.6ML ~~LOC~~ SOSY
6.0000 mg | PREFILLED_SYRINGE | Freq: Once | SUBCUTANEOUS | Status: AC
  Administered 2019-12-30: 6 mg via SUBCUTANEOUS

## 2019-12-30 NOTE — Patient Instructions (Signed)
[facility] at [facility] Discharge Instructions  Received Fulphila injection today. Follow-up as scheduled   Thank you for choosing [facility] at [facility] to provide your oncology and hematology care.  To afford each patient quality time with our provider, please arrive at least 15 minutes before your scheduled appointment time.   If you have a lab appointment with the Cancer Center please come in thru the Main Entrance and check in at the main information desk.  You need to re-schedule your appointment should you arrive 10 or more minutes late.  We strive to give you quality time with our providers, and arriving late affects you and other patients whose appointments are after yours.  Also, if you no show three or more times for appointments you may be dismissed from the clinic at the providers discretion.     Again, thank you for choosing [facility].  Our hope is that these requests will decrease the amount of time that you wait before being seen by our physicians.       _____________________________________________________________  Should you have questions after your visit to [facility], please contact our office at [phone] and follow the prompts.  Our office hours are 8:00 a.m. and 4:30 p.m. Monday - Friday.  Please note that voicemails left after 4:00 p.m. may not be returned until the following business day.  We are closed weekends and major holidays.  You do have access to a nurse 24-7, just call the main number to the clinic [phone] and do not press any options, hold on the line and a nurse will answer the phone.    For prescription refill requests, have your pharmacy contact our office and allow 72 hours.    Due to Covid, you will need to wear a mask upon entering the hospital. If you do not have a mask, a mask will be given to you at the Main Entrance upon arrival. For doctor visits, patients may  have 1 support person age 18 or older with them. For treatment visits, patients can not have anyone with them due to social distancing guidelines and our immunocompromised population.     

## 2019-12-30 NOTE — Progress Notes (Signed)
Scott [name] tolerated Fulphila injection well without complaints or incident. VSS Pt discharged self ambulatory in satisfactory condition accompanied by his wife 

## 2020-01-14 ENCOUNTER — Encounter (HOSPITAL_COMMUNITY)
Admission: RE | Admit: 2020-01-14 | Discharge: 2020-01-14 | Disposition: A | Discharge status: Home / Self Care | Payer: BC Managed Care – PPO | Source: Ambulatory Visit | Attending: Hematology | Admitting: Hematology

## 2020-01-14 ENCOUNTER — Other Ambulatory Visit: Payer: Self-pay

## 2020-01-14 ENCOUNTER — Ambulatory Visit (HOSPITAL_COMMUNITY)
Admission: RE | Admit: 2020-01-14 | Discharge: 2020-01-14 | Disposition: A | Discharge status: Home / Self Care | Payer: BC Managed Care – PPO | Source: Ambulatory Visit | Attending: Hematology | Admitting: Hematology

## 2020-01-14 ENCOUNTER — Encounter (HOSPITAL_COMMUNITY): Payer: Self-pay

## 2020-01-14 DIAGNOSIS — C61 Malignant neoplasm of prostate: Secondary | ICD-10-CM | POA: Insufficient documentation

## 2020-01-14 MED ORDER — IOHEXOL 300 MG/ML  SOLN
100.0000 mL | Freq: Once | INTRAMUSCULAR | Status: AC | PRN
  Administered 2020-01-14: 100 mL via INTRAVENOUS

## 2020-01-14 MED ORDER — TECHNETIUM TC 99M MEDRONATE IV KIT
20.0000 | PACK | Freq: Once | INTRAVENOUS | Status: AC | PRN
  Administered 2020-01-14: 19.7 via INTRAVENOUS

## 2020-01-18 ENCOUNTER — Inpatient Hospital Stay (HOSPITAL_COMMUNITY): Payer: BC Managed Care – PPO

## 2020-01-18 ENCOUNTER — Other Ambulatory Visit: Payer: Self-pay

## 2020-01-18 ENCOUNTER — Inpatient Hospital Stay (HOSPITAL_BASED_OUTPATIENT_CLINIC_OR_DEPARTMENT_OTHER): Payer: BC Managed Care – PPO | Admitting: Hematology

## 2020-01-18 ENCOUNTER — Inpatient Hospital Stay (HOSPITAL_COMMUNITY): Payer: BC Managed Care – PPO | Attending: Hematology

## 2020-01-18 VITALS — BP 157/85 | HR 55 | Temp 97.5°F | Resp 18

## 2020-01-18 VITALS — BP 156/78 | HR 57 | Temp 97.1°F | Resp 18 | Wt 179.4 lb

## 2020-01-18 DIAGNOSIS — C61 Malignant neoplasm of prostate: Secondary | ICD-10-CM | POA: Insufficient documentation

## 2020-01-18 DIAGNOSIS — I10 Essential (primary) hypertension: Secondary | ICD-10-CM | POA: Diagnosis not present

## 2020-01-18 DIAGNOSIS — C7951 Secondary malignant neoplasm of bone: Secondary | ICD-10-CM | POA: Insufficient documentation

## 2020-01-18 DIAGNOSIS — E559 Vitamin D deficiency, unspecified: Secondary | ICD-10-CM | POA: Insufficient documentation

## 2020-01-18 DIAGNOSIS — E039 Hypothyroidism, unspecified: Secondary | ICD-10-CM | POA: Insufficient documentation

## 2020-01-18 DIAGNOSIS — Z5189 Encounter for other specified aftercare: Secondary | ICD-10-CM | POA: Insufficient documentation

## 2020-01-18 DIAGNOSIS — C778 Secondary and unspecified malignant neoplasm of lymph nodes of multiple regions: Secondary | ICD-10-CM | POA: Diagnosis not present

## 2020-01-18 DIAGNOSIS — Z87891 Personal history of nicotine dependence: Secondary | ICD-10-CM | POA: Insufficient documentation

## 2020-01-18 DIAGNOSIS — Z5111 Encounter for antineoplastic chemotherapy: Secondary | ICD-10-CM | POA: Insufficient documentation

## 2020-01-18 DIAGNOSIS — N1330 Unspecified hydronephrosis: Secondary | ICD-10-CM | POA: Insufficient documentation

## 2020-01-18 DIAGNOSIS — Z79899 Other long term (current) drug therapy: Secondary | ICD-10-CM | POA: Diagnosis not present

## 2020-01-18 DIAGNOSIS — G629 Polyneuropathy, unspecified: Secondary | ICD-10-CM | POA: Insufficient documentation

## 2020-01-18 LAB — CBC WITH DIFFERENTIAL/PLATELET
Abs Immature Granulocytes: 0.02 10*3/uL (ref 0.00–0.07)
Basophils Absolute: 0.2 10*3/uL — ABNORMAL HIGH (ref 0.0–0.1)
Basophils Relative: 3 %
Eosinophils Absolute: 0 10*3/uL (ref 0.0–0.5)
Eosinophils Relative: 0 %
HCT: 35 % — ABNORMAL LOW (ref 39.0–52.0)
Hemoglobin: 11.6 g/dL — ABNORMAL LOW (ref 13.0–17.0)
Immature Granulocytes: 0 %
Lymphocytes Relative: 17 %
Lymphs Abs: 1.1 10*3/uL (ref 0.7–4.0)
MCH: 31.7 pg (ref 26.0–34.0)
MCHC: 33.1 g/dL (ref 30.0–36.0)
MCV: 95.6 fL (ref 80.0–100.0)
Monocytes Absolute: 0.8 10*3/uL (ref 0.1–1.0)
Monocytes Relative: 12 %
Neutro Abs: 4.4 10*3/uL (ref 1.7–7.7)
Neutrophils Relative %: 68 %
Platelets: 286 10*3/uL (ref 150–400)
RBC: 3.66 MIL/uL — ABNORMAL LOW (ref 4.22–5.81)
RDW: 14.1 % (ref 11.5–15.5)
WBC: 6.4 10*3/uL (ref 4.0–10.5)
nRBC: 0 % (ref 0.0–0.2)

## 2020-01-18 LAB — COMPREHENSIVE METABOLIC PANEL
ALT: 14 U/L (ref 0–44)
AST: 16 U/L (ref 15–41)
Albumin: 3.9 g/dL (ref 3.5–5.0)
Alkaline Phosphatase: 48 U/L (ref 38–126)
Anion gap: 7 (ref 5–15)
BUN: 15 mg/dL (ref 8–23)
CO2: 25 mmol/L (ref 22–32)
Calcium: 9 mg/dL (ref 8.9–10.3)
Chloride: 99 mmol/L (ref 98–111)
Creatinine, Ser: 0.86 mg/dL (ref 0.61–1.24)
GFR, Estimated: >60 mL/min (ref >60)
Glucose, Bld: 96 mg/dL (ref 70–99)
Potassium: 3.9 mmol/L (ref 3.5–5.1)
Sodium: 131 mmol/L — ABNORMAL LOW (ref 135–145)
Total Bilirubin: 1 mg/dL (ref 0.3–1.2)
Total Protein: 6.4 g/dL — ABNORMAL LOW (ref 6.5–8.1)

## 2020-01-18 LAB — PSA: Prostatic Specific Antigen: 4.2 ng/mL — ABNORMAL HIGH (ref 0.00–4.00)

## 2020-01-18 MED ORDER — SODIUM CHLORIDE 0.9 % IV SOLN: Freq: Once | INTRAVENOUS | Status: AC

## 2020-01-18 MED ORDER — SODIUM CHLORIDE 0.9 % IV SOLN
Freq: Once | INTRAVENOUS | Status: DC
  Filled 2020-01-18: qty 4

## 2020-01-18 MED ORDER — SODIUM CHLORIDE 0.9 % IV SOLN
75.0000 mg/m2 | Freq: Once | INTRAVENOUS | Status: AC
  Administered 2020-01-18: 140 mg via INTRAVENOUS
  Filled 2020-01-18: qty 14

## 2020-01-18 MED ORDER — ONDANSETRON HCL 4 MG/2ML IJ SOLN: 8.0000 mg | Freq: Once | INTRAMUSCULAR | Status: DC

## 2020-01-18 MED ORDER — FAMOTIDINE IN NACL 20-0.9 MG/50ML-% IV SOLN
INTRAVENOUS | Status: AC
  Filled 2020-01-18: qty 50

## 2020-01-18 MED ORDER — SODIUM CHLORIDE 0.9 % IV SOLN
Freq: Once | INTRAVENOUS | Status: AC
  Administered 2020-01-18: 8 mg via INTRAVENOUS
  Filled 2020-01-18: qty 4

## 2020-01-18 MED ORDER — SODIUM CHLORIDE 0.9 % IV SOLN
10.0000 mg | Freq: Once | INTRAVENOUS | Status: AC
  Administered 2020-01-18: 10 mg via INTRAVENOUS
  Filled 2020-01-18: qty 10

## 2020-01-18 NOTE — Patient Instructions (Signed)
[facility] Discharge Instructions for Patients Receiving Chemotherapy  Today you received the following chemotherapy agents   To help prevent nausea and vomiting after your treatment, we encourage you to take your nausea medication   If you develop nausea and vomiting that is not controlled by your nausea medication, call the clinic.   BELOW ARE SYMPTOMS THAT SHOULD BE REPORTED IMMEDIATELY:  *FEVER GREATER THAN 100.5 F  *CHILLS WITH OR WITHOUT FEVER  NAUSEA AND VOMITING THAT IS NOT CONTROLLED WITH YOUR NAUSEA MEDICATION  *UNUSUAL SHORTNESS OF BREATH  *UNUSUAL BRUISING OR BLEEDING  TENDERNESS IN MOUTH AND THROAT WITH OR WITHOUT PRESENCE OF ULCERS  *URINARY PROBLEMS  *BOWEL PROBLEMS  UNUSUAL RASH Items with * indicate a potential emergency and should be followed up as soon as possible.  Feel free to call the clinic should you have any questions or concerns. The clinic phone number is [phone].  Please show the CHEMO ALERT CARD at check-in to the Emergency Department and triage nurse.   

## 2020-01-18 NOTE — Progress Notes (Signed)
Scott [name], [place] [address]   CLINIC:  Medical Oncology/Hematology  PCP:  Doree Albee, MD [place] / [place] [address] [phone]   REASON FOR VISIT:  Follow-up for metastatic prostate cancer  PRIOR THERAPY: TURP on 11/10/2019  NGS Results: Not done  CURRENT THERAPY: Docetaxel every 3 weeks  BRIEF ONCOLOGIC HISTORY:  Oncology History  Prostate cancer metastatic to multiple sites [facility])  01/25/2017 Initial Diagnosis   Prostate cancer metastatic to multiple sites [facility])   11/16/2019 -  Chemotherapy   The patient had ondansetron (ZOFRAN) injection 8 mg, 8 mg (100 % of original dose 8 mg), Intravenous,  Once, 3 of 6 cycles Dose modification: 8 mg (original dose 8 mg, Cycle 1) pegfilgrastim-jmdb (FULPHILA) injection 6 mg, 6 mg, Subcutaneous,  Once, 3 of 6 cycles Administration: 6 mg (11/18/2019), 6 mg (12/09/2019), 6 mg (12/30/2019) ondansetron (ZOFRAN) 8 mg in sodium chloride 0.9 % 50 mL IVPB, , Intravenous,  Once, 1 of 4 cycles Administration: 8 mg (12/28/2019) DOCEtaxel (TAXOTERE) 140 mg in sodium chloride 0.9 % 250 mL chemo infusion, 75 mg/m2 = 140 mg, Intravenous,  Once, 3 of 6 cycles Administration: 140 mg (11/16/2019), 140 mg (12/07/2019), 140 mg (12/28/2019)  for chemotherapy treatment.      CANCER STAGING: Cancer Staging No matching staging information was found for the patient.  INTERVAL HISTORY:  [name], a 64 y.o. male, returns for routine follow-up and consideration for next cycle of chemotherapy. Scott [name] was last seen on 12/28/2019.  Due for cycle #4 of docetaxel today.   Today he is accompanied by his wife. Overall, he tells me he has been feeling pretty well. He tolerated the previous treatment well and only reports local tenderness and hardening of the veins where he received the treatment. He reports occasional tingling in his left toes, but denies N/V/D or new cough. His back pain is  well-controlled with Norco.  Overall, he feels ready for next cycle of chemo today.    REVIEW OF SYSTEMS:  Review of Systems  Constitutional: Positive for fatigue (75%). Negative for appetite change.  Respiratory: Negative for cough.   Gastrointestinal: Negative for diarrhea, nausea and vomiting.  Musculoskeletal: Positive for back pain (improving).  Skin: Positive for wound (local tenderness over infusion site).  Neurological: Negative for numbness.  All other systems reviewed and are negative.   PAST MEDICAL/SURGICAL HISTORY:  Past Medical History:  Diagnosis Date  . Bladder obstruction   . Dyspnea   . Essential hypertension, benign 01/26/2019   at [place] office elevated, normal at home, no medications at this time  . History of basal cell cancer   . HLD (hyperlipidemia) 01/26/2019  . Hypothyroidism    history of, not currently taking medication  . Prostate cancer ([place])   . Vitamin D deficiency disease 01/26/2019   Past Surgical History:  Procedure Laterality Date  . CYSTOSCOPY W/ URETERAL STENT PLACEMENT Left 11/10/2019   Procedure: CYSTOSCOPY;  Surgeon: Irine Seal, MD;  Location: [facility];  Service: Urology;  Laterality: Left;  . HERNIA REPAIR  3810   UMBILICAL   . PROSTATE SURGERY    . SKIN CANCER EXCISION    . TONSILLECTOMY Bilateral   . TRANSURETHRAL RESECTION OF PROSTATE N/A 01/25/2017   Procedure: TRANSURETHRAL RESECTION OF THE PROSTATE (TURP);  Surgeon: Alexis Frock, MD;  Location: [facility];  Service: Urology;  Laterality: N/A;  . TRANSURETHRAL RESECTION OF PROSTATE N/A 11/10/2019   Procedure: TRANSURETHRAL RESECTION  OF THE PROSTATE (TURP);  Surgeon: Irine Seal, MD;  Location: [facility];  Service: Urology;  Laterality: N/A;  . WISDOM TOOTH EXTRACTION      SOCIAL HISTORY:  Social History   Socioeconomic History  . Marital status: Married    Spouse name: Not on file  . Number of children: 1  . Years of education: Not on file  . Highest education level: Not on  file  Occupational History    Comment: working full time  Tobacco Use  . Smoking status: Former Smoker    Packs/day: 1.50    Years: 35.00    Pack years: 52.50    Types: Cigarettes    Quit date: 01/12/2004    Years since quitting: 16.0  . Smokeless tobacco: Never Used  . Tobacco comment: OVER 30 YEARS HX OF SMOKING   Vaping Use  . Vaping Use: Never used  Substance and Sexual Activity  . Alcohol use: No  . Drug use: Not Currently  . Sexual activity: Not Currently  Other Topics Concern  . Not on file  Social History Narrative   Married for 19 years.Works for [employer] from home.   Social Determinants of Health   Financial Resource Strain:   . Difficulty of Paying Living Expenses: Not on file  Food Insecurity:   . Worried About Charity fundraiser in the Last Year: Not on file  . Ran Out of Food in the Last Year: Not on file  Transportation Needs:   . Lack of Transportation (Medical): Not on file  . Lack of Transportation (Non-Medical): Not on file  Physical Activity:   . Days of Exercise per Week: Not on file  . Minutes of Exercise per Session: Not on file  Stress:   . Feeling of Stress : Not on file  Social Connections:   . Frequency of Communication with Friends and Family: Not on file  . Frequency of Social Gatherings with Friends and Family: Not on file  . Attends Religious Services: Not on file  . Active Member of Clubs or Organizations: Not on file  . Attends Archivist Meetings: Not on file  . Marital Status: Not on file  Intimate Partner Violence:   . Fear of Current or Ex-Partner: Not on file  . Emotionally Abused: Not on file  . Physically Abused: Not on file  . Sexually Abused: Not on file    FAMILY HISTORY:  Family History  Problem Relation Age of Onset  . Emphysema Mother   . Alzheimer's disease Father   . Prostate cancer Father        dx in his 57s  . Prostate cancer Brother        dx in his 25s  . Breast cancer Maternal  Grandmother   . Pancreatic cancer Maternal Grandfather   . Dementia Paternal Grandmother   . Heart attack Paternal Grandfather     CURRENT MEDICATIONS:  Current Outpatient Medications  Medication Sig Dispense Refill  . acetaminophen (TYLENOL) 500 MG tablet Take 1,000 mg by mouth at bedtime.    . ASTRAGALUS PO Take 1 tablet by mouth 2 (two) times daily.    Azucena Freed Serrata (BOSWELLIA PO) Take 1 capsule by mouth 2 (two) times daily.    . Cholecalciferol (D 5000) 5000 units capsule Take 5,000-10,000 Units by mouth See admin instructions. Take 10000 units in the morning and 5000 units at night    . DOCETAXEL IV Inject 75 mg/m2 into the vein every 21 (  twenty-one) days.     Marland Kitchen dutasteride (AVODART) 0.5 MG capsule Take 1 capsule (0.5 mg total) by mouth daily. 30 capsule 11  . estradiol (VIVELLE-DOT) 0.05 MG/24HR patch Place 1 patch (0.05 mg total) onto the skin 2 (two) times a week. For management of hot flashes for ADT. Patient taking differently: Place 1 patch onto skin. Apply 1 patch once every 4 days for management of hot flashes for ADT. 8 patch 12  . HYDROcodone-acetaminophen (NORCO/VICODIN) 5-325 MG tablet Take 1 tablet by mouth every 4 (four) hours as needed for moderate pain. 10 tablet 0  . ibuprofen (ADVIL) 200 MG tablet Take 400 mg by mouth every 6 (six) hours as needed for moderate pain.    Marland Kitchen Leuprolide Acetate, 6 Month, (LUPRON DEPOT, 25-MONTH,) 45 MG injection Inject 45 mg into the muscle every 6 (six) months.    Marland Kitchen MAGNESIUM PO Take 1 tablet by mouth at bedtime.    Marland Kitchen MELATONIN PO Take 40 mg by mouth at bedtime.    Marland Kitchen MILK THISTLE PO Take 1 capsule by mouth daily.    . Misc Natural Products (TURMERIC CURCUMIN) CAPS Take 800 capsules by mouth 2 (two) times daily.    Marland Kitchen NATTOKINASE PO Take 2 capsules by mouth at bedtime.    Marland Kitchen OVER THE COUNTER MEDICATION Take 1 capsule by mouth 2 (two) times daily. [place] [employer] Mushroom  Anti-Cancer /Immune Scientist, product/process development     . OVER THE COUNTER MEDICATION Place  1 Dose under the tongue 2 (two) times daily. CBD oil daily     . OVER THE COUNTER MEDICATION Take 1 capsule by mouth daily. Gamma E otc supplement    . OVER THE COUNTER MEDICATION Take 1 capsule by mouth 2 (two) times daily. zyflamend otc supplement    . pegfilgrastim-jmdb (FULPHILA) 6 MG/0.6ML injection Inject 6 mg into the skin every 21 ( twenty-one) days. On Day 3 with each cycle of chemotherapy    . predniSONE (DELTASONE) 5 MG tablet Take 1 tablet (5 mg total) by mouth 2 (two) times daily with a meal. Take 1 tablet in the morning and 1 tablet at bedtime 60 tablet 3  . QUERCETIN PO Take 1 tablet by mouth 2 (two) times daily.    Marland Kitchen Resveratrol 250 MG CAPS Take 250 mg by mouth daily.    . vitamin B-12 (CYANOCOBALAMIN) 1000 MCG tablet Take 1,000 mcg by mouth daily.    Marland Kitchen VITAMIN K PO Take 1 capsule by mouth daily.    . Zinc 30 MG CAPS Take 30 mg by mouth daily.    . cimetidine (TAGAMET) 200 MG tablet Take 200-400 mg by mouth daily as needed (inflammation). (Patient not taking: Reported on 01/18/2020)    . ondansetron (ZOFRAN ODT) 8 MG disintegrating tablet Take 1 tablet (8 mg total) by mouth every 8 (eight) hours as needed for nausea or vomiting. (Patient not taking: Reported on 01/18/2020) 20 tablet 3  . prochlorperazine (COMPAZINE) 10 MG tablet TAKE 1 TABLET(10 MG) BY MOUTH EVERY 6 HOURS AS NEEDED FOR NAUSEA OR VOMITING (Patient not taking: Reported on 01/18/2020) 60 tablet 3   No current facility-administered medications for this visit.   Facility-Administered Medications Ordered in Other Visits  Medication Dose Route Frequency Provider Last Rate Last Admin  . heparin lock flush 100 unit/mL  500 Units Intracatheter Once PRN [name], MD      . sodium chloride flush (NS) 0.9 % injection 10 mL  10 mL Intracatheter PRN [name], MD  ALLERGIES:  Allergies  Allergen Reactions  . Wellbutrin [Bupropion] Hives    PHYSICAL EXAM:  Performance status (ECOG): 0 -  Asymptomatic  Vitals:   01/18/20 1253  BP: (!) 156/78  Pulse: (!) 57  Resp: 18  Temp: (!) 97.1 F (36.2 C)  SpO2: 98%   Wt Readings from Last 3 Encounters:  01/18/20 179 lb 6.4 oz (81.4 kg)  12/28/19 175 lb 12.8 oz (79.7 kg)  12/07/19 171 lb 11.8 oz (77.9 kg)   Physical Exam Vitals reviewed.  Constitutional:      Appearance: Normal appearance.  Cardiovascular:     Rate and Rhythm: Normal rate and regular rhythm.     Pulses: Normal pulses.     Heart sounds: Normal heart sounds.  Pulmonary:     Effort: Pulmonary effort is normal.     Breath sounds: Normal breath sounds.  Neurological:     General: No focal deficit present.     Mental Status: He is alert and oriented to person, place, and time.  Psychiatric:        Mood and Affect: Mood normal.        Behavior: Behavior normal.     LABORATORY DATA:  I have reviewed the labs as listed.  CBC Latest Ref Rng & Units 01/18/2020 12/28/2019 12/07/2019  WBC 4.0 - 10.5 K/uL 6.4 6.4 4.1  Hemoglobin 13.0 - 17.0 g/dL 11.6(L) 12.0(L) 11.3(L)  Hematocrit 39 - 52 % 35.0(L) 34.9(L) 32.7(L)  Platelets 150 - 400 K/uL 286 304 362   CMP Latest Ref Rng & Units 01/18/2020 12/28/2019 12/07/2019  Glucose 70 - 99 mg/dL 96 96 110(H)  BUN 8 - 23 mg/dL 15 15 13   Creatinine 0.61 - 1.24 mg/dL 0.86 0.93 0.91  Sodium 135 - 145 mmol/L 131(L) 134(L) 134(L)  Potassium 3.5 - 5.1 mmol/L 3.9 3.9 3.8  Chloride 98 - 111 mmol/L 99 101 104  CO2 22 - 32 mmol/L 25 24 23   Calcium 8.9 - 10.3 mg/dL 9.0 9.2 9.0  Total Protein 6.5 - 8.1 g/dL 6.4(L) 6.6 6.3(L)  Total Bilirubin 0.3 - 1.2 mg/dL 1.0 1.1 1.2  Alkaline Phos 38 - 126 U/L 48 49 51  AST 15 - 41 U/L 16 15 15   ALT 0 - 44 U/L 14 18 16     DIAGNOSTIC IMAGING:  I have independently reviewed the scans and discussed with the patient. NM Bone Scan Whole Body  Result Date: 01/15/2020 CLINICAL DATA:  Prostate cancer, post chemotherapy EXAM: NUCLEAR MEDICINE WHOLE BODY BONE SCAN TECHNIQUE: Whole body anterior  and posterior images were obtained approximately 3 hours after intravenous injection of radiopharmaceutical. RADIOPHARMACEUTICALS:  19.7 mCi Technetium-71m MDP IV COMPARISON:  10/27/2019 Correlation: CT abdomen pelvis 01/14/2020 FINDINGS: Uptake at LEFT L2, unchanged, corresponding to sclerotic metastatic lesion on CT. Mild uptake at shoulders, sternoclavicular joints, hips, LEFT foot, typically degenerative. Uptake at lateral aspects of lower lumbar spine bilaterally, typically degenerative and corresponding to facet degenerative changes on CT. No additional sites of abnormal osseous tracer accumulation. Nonspecific vague asymmetric tracer in soft tissues of RIGHT chest wall unchanged. IMPRESSION: Stable LEFT L2 osseous metastatic focus. No new scintigraphic abnormalities identified. Electronically Signed   By: Lavonia Dana M.D.   On: 01/15/2020 09:27   CT Abdomen Pelvis W Contrast  Result Date: 01/14/2020 CLINICAL DATA:  Follow-up prostate cancer, ongoing chemotherapy, status post interval TURP EXAM: CT ABDOMEN AND PELVIS WITH CONTRAST TECHNIQUE: Multidetector CT imaging of the abdomen and pelvis was performed using the standard protocol following bolus  administration of intravenous contrast. CONTRAST:  165mL OMNIPAQUE IOHEXOL 300 MG/ML SOLN, additional oral enteric contrast COMPARISON:  11/03/2019 FINDINGS: Lower chest: No acute abnormality. Hepatobiliary: No solid liver abnormality is seen. No gallstones, gallbladder wall thickening, or biliary dilatation. Pancreas: Unremarkable. No pancreatic ductal dilatation or surrounding inflammatory changes. Spleen: Normal in size without significant abnormality. Adrenals/Urinary Tract: Adrenal glands are unremarkable. Left hydronephrosis is significantly reduced compared to prior examination, with mild, persistent bilateral hydronephrosis. Thickening of the urinary bladder wall. Stomach/Bowel: Stomach is within normal limits. Appendix is not clearly visualized and may  be diminutive or surgically absent. No evidence of bowel wall thickening, distention, or inflammatory changes. Sigmoid diverticulosis. Vascular/Lymphatic: Aortic atherosclerosis. Prominent, stranded left-sided retroperitoneal lymph nodes are reduced in size, an index node measuring 0.5 x 0.5 cm, previously 1.0 x 1.0 cm. Prominent left iliac lymph nodes are slightly decreased in size, an index node measuring 0.7 x 0.6 cm, previously 0.9 x 0.7 cm (series 2, image 59). Subcentimeter right iliac lymph nodes not significantly changed (series 2, image 60). Reproductive: Interval TURP with nodular resection of soft tissue near the bladder base seen on prior examination (series 2, image 69). Other: Unchanged fat containing midline ventral epigastric hernia (series 2, image 36). No abdominopelvic ascites. Musculoskeletal: Interval increase in sclerosis of lesions of the left aspect of the L2 vertebral body (series 2, image 23). Very subtle sclerotic lesion in the left iliac wing, unchanged compared to prior examination (series 2, image 45). IMPRESSION: 1. Interval TURP with nodular resection of soft tissue near the bladder base seen on prior examination. Persistent thickening of the urinary bladder wall. 2. Left hydronephrosis is significantly reduced compared to prior examination, with mild, persistent bilateral hydronephrosis. 3. Prominent, stranded left-sided retroperitoneal and left iliac lymph nodes are reduced in size, consistent with treatment response. 4. Interval increase in sclerosis of lesions of the left aspect of the L2 vertebral body, consistent with treatment response of osseous metastatic disease. Very subtle sclerotic lesion in the left iliac wing, unchanged compared to prior examination. No new osseous lesions appreciated. Please see forthcoming nuclear scintigraphic bone scan for more sensitive assessment of osseous metastatic disease. 5. Aortic Atherosclerosis (ICD10-I70.0). Electronically Signed   By:  Eddie Candle M.D.   On: 01/14/2020 16:24     ASSESSMENT:  1. 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 Prominent left hydronephrosis and hydroureter noted. New sclerotic lesion in the left anterior vertebral body of L1. Mild left periaortic adenopathy increased from prior, short axis lymph node measuring 1 cm. -Last PSA increased to 15.8 on 09/28/2019. -As there is rapid progression of disease, I have recommended docetaxel chemotherapy. He is agreeable after long discussion. -Cystoscopy with transurethral resection of tumor in the posterior bladder neck, right and left walls of the bladder, right lobe of the prostate on 11/10/2019. -Plan was to do 6 cycles of docetaxel followed by abiraterone and prednisone. -Cycle 1 of docetaxel on 11/16/2019. -CTAP on 01/06/2020 showed persistent thickening of the urinary bladder wall.  Left hydronephrosis is significantly reduced compared to prior exam.  Left-sided retroperitoneal and left iliac lymph nodes reduced in size.  Interval increase in sclerosis of lesions of the left aspect of L2 vertebral body consistent with treatment response.  No new bone lesions. -Bone scan on 01/06/2020 shows stable L2 metastatic focus.  2. Left hydroureteronephrosis: -CT scan showed  prostate mass invading the posterior bladder and obstructing the ureter. Creatinine increased to 1.1. -Renal ultrasound on 12/11/2019 showed interval resolution of previously seen left-sided hydronephrosis.  Previously seen soft tissue mass at the posterior bladder lumen no longer seen.   PLAN:  1. 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 -We have reviewed his labs.  LFTs were normal.  CBC shows normal white count and platelets. -He does get some pains after the  Neulasta injection for which he takes Claritin, ibuprofen and hydrocodone which is helping. -He will proceed with his next cycle of docetaxel today.  We will slightly prolonged infusion time as he developed some phlebitis on the left forearm with the previous dose. -RTC 3 weeks with repeat labs.  2. Left hydroureteronephrosis: -This has improved on the most recent CT scan.  Creatinine is 0.86.  3.  Hot flashes: -Continue estradiol patch 0.05 mcg.  4.  Peripheral neuropathy: -Reported on and off tingling and numbness in the left foot toes.  He had 1 day of pain in the right great toe. -We will closely monitor.   Orders placed this encounter:  No orders of the defined types were placed in this encounter.    [name], MD [place] [phone]   I, Milinda Antis, am acting as a scribe for Dr. Sanda Linger.  I, [name] MD, have reviewed the above documentation for accuracy and completeness, and I agree with the above.

## 2020-01-18 NOTE — Progress Notes (Signed)
Patient experienced phlebitis with last infusion.  Request from MD today to run over 1.5 hours and place in 500 ml NS.  Orders updated to reflect above.  T.O. Dr Jim Desanctis, RN/Jozsef Wescoat Ronnald Ramp, Pharmd

## 2020-01-18 NOTE — Progress Notes (Signed)
Patient was assessed by Dr. Delton Coombes and labs have been reviewed.  Patient has phlebitis around injection site from his last infusion, orders received from Dr. Delton Coombes to slow rate down today.  Patient is okay to proceed with treatment today. Primary RN and pharmacy aware.

## 2020-01-18 NOTE — Patient Instructions (Signed)
[place] at [facility] Discharge Instructions  You were seen today by Dr. Delton Coombes. He went over your recent results and scans. You received your treatment today. Apply a warm compress over the infusion site after receiving your treatment. Dr. Delton Coombes will see you back in 3 weeks for labs and follow up.   Thank you for choosing [place] at [facility] to provide your oncology and hematology care.  To afford each patient quality time with our provider, please arrive at least 15 minutes before your scheduled appointment time.   If you have a lab appointment with the [place] please come in thru the Main Entrance and check in at the main information desk  You need to re-schedule your appointment should you arrive 10 or more minutes late.  We strive to give you quality time with our providers, and arriving late affects you and other patients whose appointments are after yours.  Also, if you no show three or more times for appointments you may be dismissed from the clinic at the providers discretion.     Again, thank you for choosing [facility].  Our hope is that these requests will decrease the amount of time that you wait before being seen by our physicians.       _____________________________________________________________  Should you have questions after your visit to [facility], please contact our office at [phone] between the hours of 8:00 a.m. and 4:30 p.m.  Voicemails left after 4:00 p.m. will not be returned until the following business day.  For prescription refill requests, have your pharmacy contact our office and allow 72 hours.    Cancer Center Support Programs:   > Cancer Support Group  2nd Tuesday of the month 1pm-2pm, Journey Room

## 2020-01-18 NOTE — Progress Notes (Signed)
Patient presents today for treatment and follow up visit with Dr. Delton Coombes. Labs reviewed. Vital signs stable.   Message received form [place] RN/ Dr. Delton Coombes to proceed with treatment. Patient has complaints of pain at the infusion site from his last treatment and experienced burning during the infusion. Per Message received from [place] RN/ Dr. Delton Coombes slow infusion over 1.5 hours and add 533mls of NS to the infusion to prevent phlebitis.   Treatment given today per MD orders. Tolerated infusion without adverse affects. Vital signs stable. No complaints at this time. Discharged from clinic ambulatory in stable condition. Alert and oriented x 3. F/U with [facility] as scheduled.

## 2020-01-20 ENCOUNTER — Encounter (HOSPITAL_COMMUNITY): Payer: Self-pay

## 2020-01-20 ENCOUNTER — Inpatient Hospital Stay (HOSPITAL_COMMUNITY): Payer: BC Managed Care – PPO

## 2020-01-20 ENCOUNTER — Other Ambulatory Visit: Payer: Self-pay

## 2020-01-20 VITALS — BP 148/77 | HR 61 | Temp 96.7°F | Resp 17

## 2020-01-20 DIAGNOSIS — Z5111 Encounter for antineoplastic chemotherapy: Secondary | ICD-10-CM | POA: Diagnosis not present

## 2020-01-20 DIAGNOSIS — C61 Malignant neoplasm of prostate: Secondary | ICD-10-CM

## 2020-01-20 MED ORDER — PEGFILGRASTIM-JMDB 6 MG/0.6ML ~~LOC~~ SOSY
6.0000 mg | PREFILLED_SYRINGE | Freq: Once | SUBCUTANEOUS | Status: AC
  Administered 2020-01-20: 6 mg via SUBCUTANEOUS

## 2020-01-20 NOTE — Patient Instructions (Signed)
[facility] at [facility] Discharge Instructions  Received Fulphila injection today. Follow-up as scheduled   Thank you for choosing [facility] at [facility] to provide your oncology and hematology care.  To afford each patient quality time with our provider, please arrive at least 15 minutes before your scheduled appointment time.   If you have a lab appointment with the Cancer Center please come in thru the Main Entrance and check in at the main information desk.  You need to re-schedule your appointment should you arrive 10 or more minutes late.  We strive to give you quality time with our providers, and arriving late affects you and other patients whose appointments are after yours.  Also, if you no show three or more times for appointments you may be dismissed from the clinic at the providers discretion.     Again, thank you for choosing [facility].  Our hope is that these requests will decrease the amount of time that you wait before being seen by our physicians.       _____________________________________________________________  Should you have questions after your visit to [facility], please contact our office at [phone] and follow the prompts.  Our office hours are 8:00 a.m. and 4:30 p.m. Monday - Friday.  Please note that voicemails left after 4:00 p.m. may not be returned until the following business day.  We are closed weekends and major holidays.  You do have access to a nurse 24-7, just call the main number to the clinic [phone] and do not press any options, hold on the line and a nurse will answer the phone.    For prescription refill requests, have your pharmacy contact our office and allow 72 hours.    Due to Covid, you will need to wear a mask upon entering the hospital. If you do not have a mask, a mask will be given to you at the Main Entrance upon arrival. For doctor visits, patients may  have 1 support person age 18 or older with them. For treatment visits, patients can not have anyone with them due to social distancing guidelines and our immunocompromised population.     

## 2020-01-20 NOTE — Progress Notes (Signed)
Scott [name] tolerated Fulphila injection well without complaints or incident. VSS Pt discharged self ambulatory in satisfactory condition accompanied by his wife

## 2020-01-27 ENCOUNTER — Encounter (INDEPENDENT_AMBULATORY_CARE_PROVIDER_SITE_OTHER): Payer: Self-pay | Admitting: Internal Medicine

## 2020-01-27 ENCOUNTER — Other Ambulatory Visit: Payer: Self-pay

## 2020-01-27 ENCOUNTER — Ambulatory Visit (INDEPENDENT_AMBULATORY_CARE_PROVIDER_SITE_OTHER): Payer: BC Managed Care – PPO | Admitting: Internal Medicine

## 2020-01-27 VITALS — BP 128/60 | HR 68 | Temp 97.7°F | Resp 18 | Ht 66.0 in | Wt 179.6 lb

## 2020-01-27 DIAGNOSIS — Z131 Encounter for screening for diabetes mellitus: Secondary | ICD-10-CM

## 2020-01-27 DIAGNOSIS — R5383 Other fatigue: Secondary | ICD-10-CM

## 2020-01-27 DIAGNOSIS — R5381 Other malaise: Secondary | ICD-10-CM

## 2020-01-27 DIAGNOSIS — E782 Mixed hyperlipidemia: Secondary | ICD-10-CM | POA: Diagnosis not present

## 2020-01-27 DIAGNOSIS — E559 Vitamin D deficiency, unspecified: Secondary | ICD-10-CM | POA: Diagnosis not present

## 2020-01-27 NOTE — Progress Notes (Signed)
Metrics: Intervention Frequency ACO  Documented Smoking Status Yearly  Screened one or more times in 24 months  Cessation Counseling or  Active cessation medication Past 24 months  Past 24 months   Guideline developer: UpToDate (See UpToDate for funding source) Date Released: 2014       Wellness Office Visit  Subjective:  Patient ID: Scott [name], male    DOB: 1955-09-11  Age: 64 y.o. MRN: [id number]  CC: This man comes in for follow-up of hyperlipidemia, hypertension, vitamin D deficiency, malaise and fatigue. HPI  He was diagnosed with metastatic prostate cancer approximately 3 years ago with a PSA initially of 160. He did have androgen deprivation therapy with Lupron.  His PSA now started to rise again and he is now on chemotherapy with oncology. He feels he has no energy, he has been gaining weight, he continues to get hot flashes.  He has reduced the dose of his estradiol patch and hot flashes seems to be worse. I think his quality of life is worse. Past Medical History:  Diagnosis Date   Bladder obstruction    Dyspnea    Essential hypertension, benign 01/26/2019   at [place] office elevated, normal at home, no medications at this time   History of basal cell cancer    HLD (hyperlipidemia) 01/26/2019   Hypothyroidism    history of, not currently taking medication   Prostate cancer ([place])    Vitamin D deficiency disease 01/26/2019   Past Surgical History:  Procedure Laterality Date   CYSTOSCOPY W/ URETERAL STENT PLACEMENT Left 11/10/2019   Procedure: CYSTOSCOPY;  Surgeon: Irine Seal, MD;  Location: [facility];  Service: Urology;  Laterality: Left;   HERNIA REPAIR  1245   UMBILICAL    PROSTATE SURGERY     SKIN CANCER EXCISION     TONSILLECTOMY Bilateral    TRANSURETHRAL RESECTION OF PROSTATE N/A 01/25/2017   Procedure: TRANSURETHRAL RESECTION OF THE PROSTATE (TURP);  Surgeon: Alexis Frock, MD;  Location: [facility];  Service: Urology;  Laterality: N/A;    TRANSURETHRAL RESECTION OF PROSTATE N/A 11/10/2019   Procedure: TRANSURETHRAL RESECTION OF THE PROSTATE (TURP);  Surgeon: Irine Seal, MD;  Location[facility];  Service: Urology;  Laterality: N/A;   WISDOM TOOTH EXTRACTION       Family History  Problem Relation Age of Onset   Emphysema Mother    Alzheimer's disease Father    Prostate cancer Father        dx in his 39s   Prostate cancer Brother        dx in his 14s   Breast cancer Maternal Grandmother    Pancreatic cancer Maternal Grandfather    Dementia Paternal Grandmother    Heart attack Paternal Grandfather     Social History   Social History Narrative   Married for 19 years.Works for [employer] from home.   Social History   Tobacco Use   Smoking status: Former Smoker    Packs/day: 1.50    Years: 35.00    Pack years: 52.50    Types: Cigarettes    Quit date: 01/12/2004    Years since quitting: 16.0   Smokeless tobacco: Never Used   Tobacco comment: OVER 30 YEARS HX OF SMOKING   Substance Use Topics   Alcohol use: No    Current Meds  Medication Sig   acetaminophen (TYLENOL) 500 MG tablet Take 1,000 mg by mouth at bedtime.   ASTRAGALUS PO Take 1 tablet by mouth 2 (two) times daily.  Boswellia Serrata (BOSWELLIA PO) Take 1 capsule by mouth 2 (two) times daily.   Cholecalciferol (D 5000) 5000 units capsule Take 5,000-10,000 Units by mouth See admin instructions. Take 10000 units in the morning and 5000 units at night   cimetidine (TAGAMET) 200 MG tablet Take 200-400 mg by mouth daily as needed (inflammation).    DOCETAXEL IV Inject 75 mg/m2 into the vein every 21 ( twenty-one) days.    dutasteride (AVODART) 0.5 MG capsule Take 1 capsule (0.5 mg total) by mouth daily.   estradiol (VIVELLE-DOT) 0.05 MG/24HR patch Place 1 patch (0.05 mg total) onto the skin 2 (two) times a week. For management of hot flashes for ADT. Patient taking differently: Place 1 patch onto skin. Apply 1 patch once every 4  days for management of hot flashes for ADT.   ibuprofen (ADVIL) 200 MG tablet Take 400 mg by mouth every 6 (six) hours as needed for moderate pain.   Leuprolide Acetate, 6 Month, (LUPRON DEPOT, 71-MONTH,) 45 MG injection Inject 45 mg into the muscle every 6 (six) months.   MAGNESIUM PO Take 1 tablet by mouth at bedtime.   MELATONIN PO Take 40 mg by mouth at bedtime.   MILK THISTLE PO Take 1 capsule by mouth daily.   Misc Natural Products (TURMERIC CURCUMIN) CAPS Take 800 capsules by mouth 2 (two) times daily.   NATTOKINASE PO Take 2 capsules by mouth at bedtime.   ondansetron (ZOFRAN ODT) 8 MG disintegrating tablet Take 1 tablet (8 mg total) by mouth every 8 (eight) hours as needed for nausea or vomiting.   OVER THE COUNTER MEDICATION Take 1 capsule by mouth 2 (two) times daily. [place] Tail Mushroom  Anti-Cancer /Immune Booster    OVER THE COUNTER MEDICATION Place 1 Dose under the tongue 2 (two) times daily. CBD oil daily    OVER THE COUNTER MEDICATION Take 1 capsule by mouth daily. Gamma E otc supplement   OVER THE COUNTER MEDICATION Take 1 capsule by mouth 2 (two) times daily. zyflamend otc supplement   pegfilgrastim-jmdb (FULPHILA) 6 MG/0.6ML injection Inject 6 mg into the skin every 21 ( twenty-one) days. On Day 3 with each cycle of chemotherapy   predniSONE (DELTASONE) 5 MG tablet Take 1 tablet (5 mg total) by mouth 2 (two) times daily with a meal. Take 1 tablet in the morning and 1 tablet at bedtime   prochlorperazine (COMPAZINE) 10 MG tablet TAKE 1 TABLET(10 MG) BY MOUTH EVERY 6 HOURS AS NEEDED FOR NAUSEA OR VOMITING   QUERCETIN PO Take 1 tablet by mouth 2 (two) times daily.   Resveratrol 250 MG CAPS Take 250 mg by mouth daily.   vitamin B-12 (CYANOCOBALAMIN) 1000 MCG tablet Take 1,000 mcg by mouth daily.   VITAMIN K PO Take 1 capsule by mouth daily.   Zinc 30 MG CAPS Take 30 mg by mouth daily.      Depression screen PHQ 2/9 07/27/2019  Decreased Interest 0  Down,  Depressed, Hopeless 0  PHQ - 2 Score 0     Objective:   Today's Vitals: BP 128/60 (BP Location: Right Arm, Patient Position: Sitting, Cuff Size: Normal)    Pulse 68    Temp 97.7 F (36.5 C) (Temporal)    Resp 18    Ht 5\' 6"  (1.676 m)    Wt 179 lb 9.6 oz (81.5 kg)    SpO2 98%    BMI 28.99 kg/m  Vitals with BMI 01/27/2020 01/20/2020 01/18/2020  Height 5\' 6"  - -  Weight 179 lbs 10 oz - -  BMI 29 - -  Systolic 729 021 115  Diastolic 60 77 85  Pulse 68 61 55     Physical Exam   He appears to be relatively cheerful.  He is overweight.  Blood pressure is acceptable.  He does look older overall and I can see loss of muscle mass.    Assessment   1. Mixed hyperlipidemia   2. Screening for diabetes mellitus   3. Vitamin D deficiency disease   4. Malaise and fatigue       Tests ordered Orders Placed This Encounter  Procedures   Lipid panel   Hemoglobin A1c   COMPLETE METABOLIC PANEL WITH GFR   VITAMIN D 25 Hydroxy (Vit-D Deficiency, Fractures)   T3, free   T4   TSH     Plan: 1. Blood work is ordered. 2. Today I brought up the subject of treating his metastatic prostate cancer with oral estradiol try to achieve high estradiol levels which would actually help him symptomatically, achieve androgen deprivation therapy without estrogen loss and I think he would feel much better and studies show that his PSA might actually improve.  I will communicate with his urologist regarding this and follow-up with the patient in a couple of months to discuss further.   No orders of the defined types were placed in this encounter.   Doree Albee, MD

## 2020-01-28 ENCOUNTER — Other Ambulatory Visit (INDEPENDENT_AMBULATORY_CARE_PROVIDER_SITE_OTHER): Payer: Self-pay | Admitting: Internal Medicine

## 2020-01-28 LAB — TSH: TSH: 6.47 mIU/L — ABNORMAL HIGH (ref 0.40–4.50)

## 2020-01-28 LAB — COMPLETE METABOLIC PANEL WITH GFR
AG Ratio: 1.9 (calc) (ref 1.0–2.5)
ALT: 9 U/L (ref 9–46)
AST: 15 U/L (ref 10–35)
Albumin: 4.1 g/dL (ref 3.6–5.1)
Alkaline phosphatase (APISO): 83 U/L (ref 35–144)
BUN: 12 mg/dL (ref 7–25)
CO2: 28 mmol/L (ref 20–32)
Calcium: 9.2 mg/dL (ref 8.6–10.3)
Chloride: 102 mmol/L (ref 98–110)
Creat: 0.98 mg/dL (ref 0.70–1.25)
GFR, Est African American: 95 mL/min/{1.73_m2} (ref >=60)
GFR, Est Non African American: 82 mL/min/{1.73_m2} (ref >=60)
Globulin: 2.2 g/dL (calc) (ref 1.9–3.7)
Glucose, Bld: 97 mg/dL (ref 65–99)
Potassium: 4.5 mmol/L (ref 3.5–5.3)
Sodium: 138 mmol/L (ref 135–146)
Total Bilirubin: 0.5 mg/dL (ref 0.2–1.2)
Total Protein: 6.3 g/dL (ref 6.1–8.1)

## 2020-01-28 LAB — LIPID PANEL
Cholesterol: 160 mg/dL (ref <200)
HDL: 30 mg/dL — ABNORMAL LOW (ref >=40)
LDL Cholesterol (Calc): 85 mg/dL (calc)
Non-HDL Cholesterol (Calc): 130 mg/dL (calc) — ABNORMAL HIGH (ref <130)
Total CHOL/HDL Ratio: 5.3 (calc) — ABNORMAL HIGH (ref <5.0)
Triglycerides: 341 mg/dL — ABNORMAL HIGH (ref <150)

## 2020-01-28 LAB — HEMOGLOBIN A1C
Hgb A1c MFr Bld: 5.5 % of total Hgb (ref <5.7)
Mean Plasma Glucose: 111 (calc)
eAG (mmol/L): 6.2 (calc)

## 2020-01-28 LAB — VITAMIN D 25 HYDROXY (VIT D DEFICIENCY, FRACTURES): Vit D, 25-Hydroxy: 76 ng/mL (ref 30–100)

## 2020-01-28 LAB — T4: T4, Total: 6.4 ug/dL (ref 4.9–10.5)

## 2020-01-28 LAB — T3, FREE: T3, Free: 2.9 pg/mL (ref 2.3–4.2)

## 2020-01-28 MED ORDER — ESTRADIOL 2 MG PO TABS: 2.0000 mg | ORAL_TABLET | Freq: Every day | ORAL | 3 refills | Status: DC

## 2020-01-28 MED ORDER — NP THYROID 30 MG PO TABS: 30.0000 mg | ORAL_TABLET | Freq: Every day | ORAL | 3 refills | Status: DC

## 2020-02-05 ENCOUNTER — Encounter (HOSPITAL_COMMUNITY): Payer: Self-pay

## 2020-02-05 ENCOUNTER — Other Ambulatory Visit: Payer: Self-pay

## 2020-02-05 ENCOUNTER — Ambulatory Visit
Admission: RE | Admit: 2020-02-05 | Discharge: 2020-02-05 | Disposition: A | Discharge status: Home / Self Care | Payer: BC Managed Care – PPO | Source: Ambulatory Visit | Attending: Emergency Medicine | Admitting: Emergency Medicine

## 2020-02-05 VITALS — BP 134/86 | HR 94 | Temp 98.6°F | Resp 18

## 2020-02-05 DIAGNOSIS — W540XXA Bitten by dog, initial encounter: Secondary | ICD-10-CM | POA: Diagnosis not present

## 2020-02-05 DIAGNOSIS — L539 Erythematous condition, unspecified: Secondary | ICD-10-CM | POA: Diagnosis not present

## 2020-02-05 DIAGNOSIS — T148XXA Other injury of unspecified body region, initial encounter: Secondary | ICD-10-CM

## 2020-02-05 MED ORDER — MUPIROCIN 2 % EX OINT
1.0000 | TOPICAL_OINTMENT | Freq: Two times a day (BID) | CUTANEOUS | 0 refills | Status: DC
Start: 2020-02-05 — End: 2020-04-20

## 2020-02-05 MED ORDER — AMOXICILLIN-POT CLAVULANATE 875-125 MG PO TABS: 1.0000 | ORAL_TABLET | Freq: Two times a day (BID) | ORAL | 0 refills | Status: DC

## 2020-02-05 NOTE — Discharge Instructions (Signed)
Wash with warm water and mild soap Apply thin layer of Bactroban daily Augmentin prescribed.  Take as directed and to completion Follow up with PCP if symptoms persists Return or go to the ED if you have any new or worsening symptoms such as increasing redness, swelling, drainage, fever, chills, nausea, chest pain, SOB, etc..

## 2020-02-05 NOTE — Progress Notes (Signed)
Patient called the clinic today reporting a recent dog bite that he sustained on Thursday 02/04/20. Patient reports that he has an appointment with Urgent Care this morning but he wanted Dr. Delton Coombes to be aware. MD made aware. Patient remains scheduled to be seen on Monday 02/08/20 by Dr. Delton Coombes.

## 2020-02-05 NOTE — ED Provider Notes (Addendum)
Scott [name]   [id number] 02/05/20 Arrival Time: [place]   Chief Complaint  Patient presents with  . Animal Bite    SUBJECTIVE: History from: patient.  Scott [name] is a 64 y.o. male who presented to the urgent care with a  complaint of dog bite to right arm that occurred yesterday.  Dog immunizations up-to-date.  Denies a precipitating event.  Localizes the bite to his right arm.  Has tried OTC medication without relief.  Denies previous hx of dog bite.  Denies fever, chills, nausea, vomiting, headache, dizziness, weakness, fatigue, rash, or abdominal pain.   Patient tetanus immunization is up-to-date.  ROS: As per HPI.  All other pertinent ROS negative.     Past Medical History:  Diagnosis Date  . Bladder obstruction   . Dyspnea   . Essential hypertension, benign 01/26/2019   at [place] office elevated, normal at home, no medications at this time  . History of basal cell cancer   . HLD (hyperlipidemia) 01/26/2019  . Hypothyroidism    history of, not currently taking medication  . Prostate cancer ([place])   . Vitamin D deficiency disease 01/26/2019   Past Surgical History:  Procedure Laterality Date  . CYSTOSCOPY W/ URETERAL STENT PLACEMENT Left 11/10/2019   Procedure: CYSTOSCOPY;  Surgeon: Irine Seal, MD;  [facility];  Service: Urology;  Laterality: Left;  . HERNIA REPAIR  4656   UMBILICAL   . PROSTATE SURGERY    . SKIN CANCER EXCISION    . TONSILLECTOMY Bilateral   . TRANSURETHRAL RESECTION OF PROSTATE N/A 01/25/2017   Procedure: TRANSURETHRAL RESECTION OF THE PROSTATE (TURP);  Surgeon: Alexis Frock, MD;  Location[facility];  Service: Urology;  Laterality: N/A;  . TRANSURETHRAL RESECTION OF PROSTATE N/A 11/10/2019   Procedure: TRANSURETHRAL RESECTION OF THE PROSTATE (TURP);  Surgeon: Irine Seal, MD;  Location[facility];  Service: Urology;  Laterality: N/A;  . WISDOM TOOTH EXTRACTION     Allergies  Allergen Reactions  . Wellbutrin [Bupropion] Hives    Current Facility-Administered Medications on File Prior to Encounter  Medication Dose Route Frequency Provider Last Rate Last Admin  . heparin lock flush 100 unit/mL  500 Units Intracatheter Once PRN [name], MD      . sodium chloride flush (NS) 0.9 % injection 10 mL  10 mL Intracatheter PRN [name], MD       Current Outpatient Medications on File Prior to Encounter  Medication Sig Dispense Refill  . acetaminophen (TYLENOL) 500 MG tablet Take 1,000 mg by mouth at bedtime.    . ASTRAGALUS PO Take 1 tablet by mouth 2 (two) times daily.    Azucena Fre[place] Serrata (BOSWELLIA PO) Take 1 capsule by mouth 2 (two) times daily.    . Cholecalciferol (D 5000) 5000 units capsule Take 5,000-10,000 Units by mouth See admin instructions. Take 10000 units in the morning and 5000 units at night    . cimetidine (TAGAMET) 200 MG tablet Take 200-400 mg by mouth daily as needed (inflammation).     . DOCETAXEL IV Inject 75 mg/m2 into the vein every 21 ( twenty-one) days.     Marland Kitchen dutasteride (AVODART) 0.5 MG capsule Take 1 capsule (0.5 mg total) by mouth daily. 30 capsule 11  . estradiol (ESTRACE) 2 MG tablet Take 1 tablet (2 mg total) by mouth daily. 30 tablet 3  . ibuprofen (ADVIL) 200 MG tablet Take 400 mg by mouth every 6 (six) hours as needed for moderate pain.    Marland Kitchen Leuprolide  Acetate, 6 Month, (LUPRON DEPOT, 44-MONTH,) 45 MG injection Inject 45 mg into the muscle every 6 (six) months.    Marland Kitchen MAGNESIUM PO Take 1 tablet by mouth at bedtime.    Marland Kitchen MELATONIN PO Take 40 mg by mouth at bedtime.    Marland Kitchen MILK THISTLE PO Take 1 capsule by mouth daily.    . Misc Natural Products (TURMERIC CURCUMIN) CAPS Take 800 capsules by mouth 2 (two) times daily.    Marland Kitchen NATTOKINASE PO Take 2 capsules by mouth at bedtime.    . NP THYROID 30 MG tablet Take 1 tablet (30 mg total) by mouth daily before breakfast. 30 tablet 3  . ondansetron (ZOFRAN ODT) 8 MG disintegrating tablet Take 1 tablet (8 mg total) by mouth every 8  (eight) hours as needed for nausea or vomiting. 20 tablet 3  . OVER THE COUNTER MEDICATION Take 1 capsule by mouth 2 (two) times daily. [place] Tail Mushroom  Anti-Cancer /Immune Scientist, product/process development     . OVER THE COUNTER MEDICATION Place 1 Dose under the tongue 2 (two) times daily. CBD oil daily     . OVER THE COUNTER MEDICATION Take 1 capsule by mouth daily. Gamma E otc supplement    . OVER THE COUNTER MEDICATION Take 1 capsule by mouth 2 (two) times daily. zyflamend otc supplement    . pegfilgrastim-jmdb (FULPHILA) 6 MG/0.6ML injection Inject 6 mg into the skin every 21 ( twenty-one) days. On Day 3 with each cycle of chemotherapy    . predniSONE (DELTASONE) 5 MG tablet Take 1 tablet (5 mg total) by mouth 2 (two) times daily with a meal. Take 1 tablet in the morning and 1 tablet at bedtime 60 tablet 3  . prochlorperazine (COMPAZINE) 10 MG tablet TAKE 1 TABLET(10 MG) BY MOUTH EVERY 6 HOURS AS NEEDED FOR NAUSEA OR VOMITING 60 tablet 3  . QUERCETIN PO Take 1 tablet by mouth 2 (two) times daily.    Marland Kitchen Resveratrol 250 MG CAPS Take 250 mg by mouth daily.    . vitamin B-12 (CYANOCOBALAMIN) 1000 MCG tablet Take 1,000 mcg by mouth daily.    Marland Kitchen VITAMIN K PO Take 1 capsule by mouth daily.    . Zinc 30 MG CAPS Take 30 mg by mouth daily.     Social History   Socioeconomic History  . Marital status: Married    Spouse name: Not on file  . Number of children: 1  . Years of education: Not on file  . Highest education level: Not on file  Occupational History    Comment: working full time  Tobacco Use  . Smoking status: Former Smoker    Packs/day: 1.50    Years: 35.00    Pack years: 52.50    Types: Cigarettes    Quit date: 01/12/2004    Years since quitting: 16.0  . Smokeless tobacco: Never Used  . Tobacco comment: OVER 30 YEARS HX OF SMOKING   Vaping Use  . Vaping Use: Never used  Substance and Sexual Activity  . Alcohol use: No  . Drug use: Not Currently  . Sexual activity: Not Currently  Other Topics  Concern  . Not on file  Social History Narrative   Married for 19 years.Works for [employer] from home.   Social Determinants of Health   Financial Resource Strain:   . Difficulty of Paying Living Expenses: Not on file  Food Insecurity:   . Worried About Charity fundraiser in the Last Year: Not on file  .  Ran Out of Food in the Last Year: Not on file  Transportation Needs:   . Lack of Transportation (Medical): Not on file  . Lack of Transportation (Non-Medical): Not on file  Physical Activity:   . Days of Exercise per Week: Not on file  . Minutes of Exercise per Session: Not on file  Stress:   . Feeling of Stress : Not on file  Social Connections:   . Frequency of Communication with Friends and Family: Not on file  . Frequency of Social Gatherings with Friends and Family: Not on file  . Attends Religious Services: Not on file  . Active Member of Clubs or Organizations: Not on file  . Attends Archivist Meetings: Not on file  . Marital Status: Not on file  Intimate Partner Violence:   . Fear of Current or Ex-Partner: Not on file  . Emotionally Abused: Not on file  . Physically Abused: Not on file  . Sexually Abused: Not on file   Family History  Problem Relation Age of Onset  . Emphysema Mother   . Alzheimer's disease Father   . Prostate cancer Father        dx in his 58s  . Prostate cancer Brother        dx in his 24s  . Breast cancer Maternal Grandmother   . Pancreatic cancer Maternal Grandfather   . Dementia Paternal Grandmother   . Heart attack Paternal Grandfather     OBJECTIVE:  Vitals:   02/05/20 1005  BP: 134/86  Pulse: 94  Resp: 18  Temp: 98.6 F (37 C)  SpO2: 97%     Physical Exam Vitals and nursing note reviewed.  Constitutional:      General: He is not in acute distress.    Appearance: Normal appearance. He is normal weight. He is not ill-appearing, toxic-appearing or diaphoretic.  Cardiovascular:     Rate and Rhythm:  Normal rate and regular rhythm.     Pulses: Normal pulses.     Heart sounds: Normal heart sounds. No murmur heard.  No friction rub. No gallop.   Pulmonary:     Effort: Pulmonary effort is normal. No respiratory distress.     Breath sounds: Normal breath sounds. No stridor. No wheezing, rhonchi or rales.  Chest:     Chest wall: No tenderness.  Skin:    General: Skin is warm.     Findings: Erythema and wound present.     Comments: Multiple wounds present on right forearm present  Neurological:     Mental Status: He is alert and oriented to person, place, and time.     LABS:  No results found for this or any previous visit (from the past 24 hour(s)).   ASSESSMENT & PLAN:  1. Dog bite, initial encounter   2. Erythema   3. Animal bite with open wound     Meds ordered this encounter  Medications  . mupirocin ointment (BACTROBAN) 2 %    Sig: Apply 1 application topically 2 (two) times daily.    Dispense:  22 g    Refill:  0  . amoxicillin-clavulanate (AUGMENTIN) 875-125 MG tablet    Sig: Take 1 tablet by mouth every 12 (twelve) hours.    Dispense:  14 tablet    Refill:  0   Discharge instructions  Wash with warm water and mild soap Apply thin layer of Bactroban daily Augmentin prescribed.  Take as directed and to completion Follow up with PCP if symptoms  persists Return or go to the ED if you have any new or worsening symptoms such as increasing redness, swelling, drainage, fever, chills, nausea, chest pain, SOB, etc...  Reviewed expectations re: course of current medical issues. Questions answered. Outlined signs and symptoms indicating need for more acute intervention. Patient verbalized understanding. After Visit Summary given.         Emerson Monte, FNP 02/05/20 1017    Emerson Monte, FNP 02/05/20 1018

## 2020-02-05 NOTE — ED Triage Notes (Signed)
Pt presents with dog bite to right arm, was bitten by own dog, shots are up to date, tetanus is up to date

## 2020-02-08 ENCOUNTER — Inpatient Hospital Stay (HOSPITAL_COMMUNITY): Payer: BC Managed Care – PPO

## 2020-02-08 ENCOUNTER — Other Ambulatory Visit: Payer: Self-pay

## 2020-02-08 ENCOUNTER — Inpatient Hospital Stay (HOSPITAL_BASED_OUTPATIENT_CLINIC_OR_DEPARTMENT_OTHER): Payer: BC Managed Care – PPO | Admitting: Hematology

## 2020-02-08 VITALS — BP 156/76 | HR 54 | Temp 96.9°F

## 2020-02-08 VITALS — BP 158/83 | HR 71 | Temp 97.2°F | Resp 18 | Wt 177.2 lb

## 2020-02-08 DIAGNOSIS — C61 Malignant neoplasm of prostate: Secondary | ICD-10-CM | POA: Diagnosis not present

## 2020-02-08 DIAGNOSIS — Z5111 Encounter for antineoplastic chemotherapy: Secondary | ICD-10-CM | POA: Diagnosis not present

## 2020-02-08 LAB — CBC WITH DIFFERENTIAL/PLATELET
Abs Immature Granulocytes: 0.02 10*3/uL (ref 0.00–0.07)
Basophils Absolute: 0.2 10*3/uL — ABNORMAL HIGH (ref 0.0–0.1)
Basophils Relative: 3 %
Eosinophils Absolute: 0.1 10*3/uL (ref 0.0–0.5)
Eosinophils Relative: 1 %
HCT: 34.2 % — ABNORMAL LOW (ref 39.0–52.0)
Hemoglobin: 11.7 g/dL — ABNORMAL LOW (ref 13.0–17.0)
Immature Granulocytes: 0 %
Lymphocytes Relative: 14 %
Lymphs Abs: 0.8 10*3/uL (ref 0.7–4.0)
MCH: 32.6 pg (ref 26.0–34.0)
MCHC: 34.2 g/dL (ref 30.0–36.0)
MCV: 95.3 fL (ref 80.0–100.0)
Monocytes Absolute: 0.6 10*3/uL (ref 0.1–1.0)
Monocytes Relative: 11 %
Neutro Abs: 3.9 10*3/uL (ref 1.7–7.7)
Neutrophils Relative %: 71 %
Platelets: 314 10*3/uL (ref 150–400)
RBC: 3.59 MIL/uL — ABNORMAL LOW (ref 4.22–5.81)
RDW: 13.9 % (ref 11.5–15.5)
WBC: 5.5 10*3/uL (ref 4.0–10.5)
nRBC: 0 % (ref 0.0–0.2)

## 2020-02-08 LAB — COMPREHENSIVE METABOLIC PANEL
ALT: 14 U/L (ref 0–44)
AST: 15 U/L (ref 15–41)
Albumin: 3.9 g/dL (ref 3.5–5.0)
Alkaline Phosphatase: 53 U/L (ref 38–126)
Anion gap: 8 (ref 5–15)
BUN: 13 mg/dL (ref 8–23)
CO2: 23 mmol/L (ref 22–32)
Calcium: 9.3 mg/dL (ref 8.9–10.3)
Chloride: 100 mmol/L (ref 98–111)
Creatinine, Ser: 0.9 mg/dL (ref 0.61–1.24)
GFR, Estimated: >60 mL/min (ref >60)
Glucose, Bld: 116 mg/dL — ABNORMAL HIGH (ref 70–99)
Potassium: 3.7 mmol/L (ref 3.5–5.1)
Sodium: 131 mmol/L — ABNORMAL LOW (ref 135–145)
Total Bilirubin: 0.8 mg/dL (ref 0.3–1.2)
Total Protein: 6.6 g/dL (ref 6.5–8.1)

## 2020-02-08 LAB — PSA: Prostatic Specific Antigen: 4.43 ng/mL — ABNORMAL HIGH (ref 0.00–4.00)

## 2020-02-08 MED ORDER — ONDANSETRON HCL 4 MG/2ML IJ SOLN: 8.0000 mg | Freq: Once | INTRAMUSCULAR | Status: DC

## 2020-02-08 MED ORDER — SODIUM CHLORIDE 0.9 % IV SOLN: Freq: Once | INTRAVENOUS | Status: AC

## 2020-02-08 MED ORDER — SODIUM CHLORIDE 0.9% FLUSH: 10.0000 mL | INTRAVENOUS | Status: DC | PRN

## 2020-02-08 MED ORDER — SODIUM CHLORIDE 0.9 % IV SOLN
60.0000 mg/m2 | Freq: Once | INTRAVENOUS | Status: AC
  Administered 2020-02-08: 110 mg via INTRAVENOUS
  Filled 2020-02-08: qty 11

## 2020-02-08 MED ORDER — SODIUM CHLORIDE 0.9 % IV SOLN: Freq: Once | INTRAVENOUS | Status: DC

## 2020-02-08 MED ORDER — HEPARIN SOD (PORK) LOCK FLUSH 100 UNIT/ML IV SOLN: 500.0000 [IU] | Freq: Once | INTRAVENOUS | Status: DC | PRN

## 2020-02-08 MED ORDER — SODIUM CHLORIDE 0.9 % IV SOLN
Freq: Once | INTRAVENOUS | Status: AC
  Administered 2020-02-08: 8 mg via INTRAVENOUS
  Filled 2020-02-08: qty 4

## 2020-02-08 MED ORDER — SODIUM CHLORIDE 0.9 % IV SOLN
10.0000 mg | Freq: Once | INTRAVENOUS | Status: AC
  Administered 2020-02-08: 10 mg via INTRAVENOUS
  Filled 2020-02-08: qty 10

## 2020-02-08 NOTE — Patient Instructions (Signed)
[place] at [facility] Discharge Instructions  You were seen today by Dr. Delton Coombes. He went over your recent results. You received your treatment today. Stay away from cold drinks and food after receiving your treatment. Dr. Delton Coombes will see you back in 3 weeks for labs and follow up.   Thank you for choosing [place] at [facility] to provide your oncology and hematology care.  To afford each patient quality time with our provider, please arrive at least 15 minutes before your scheduled appointment time.   If you have a lab appointment with the [facility] please come in thru the Main Entrance and check in at the main information desk  You need to re-schedule your appointment should you arrive 10 or more minutes late.  We strive to give you quality time with our providers, and arriving late affects you and other patients whose appointments are after yours.  Also, if you no show three or more times for appointments you may be dismissed from the clinic at the providers discretion.     Again, thank you for choosing [facility]  Our hope is that these requests will decrease the amount of time that you wait before being seen by our physicians.       _____________________________________________________________  Should you have questions after your visit to [facility], please contact our office at [phone] between the hours of 8:00 a.m. and 4:30 p.m.  Voicemails left after 4:00 p.m. will not be returned until the following business day.  For prescription refill requests, have your pharmacy contact our office and allow 72 hours.    Cancer Center Support Programs:   > Cancer Support Group  2nd Tuesday of the month 1pm-2pm, Journey Room

## 2020-02-08 NOTE — Progress Notes (Signed)
Pt here for D1C5 of taxotere.  Labs reviewed with Dr Raliegh Ip, okay for treatment today.   Tolerated treatment well today without incidence.  Vital signs stable prior to discharge.  Discharged in stable condition ambulatory.

## 2020-02-08 NOTE — Progress Notes (Signed)
Scott [name], [place] [address]   CLINIC:  Medical Oncology/Hematology  PCP:  Doree Albee, MD [place] / [place] [address] [phone]   REASON FOR VISIT:  Follow-up for metastatic prostate cancer  PRIOR THERAPY: TURP on 11/10/2019  NGS Results: Not done  CURRENT THERAPY: Docetaxel every 3 weeks  BRIEF ONCOLOGIC HISTORY:  Oncology History  Prostate cancer metastatic to multiple sites [facility])  01/25/2017 Initial Diagnosis   Prostate cancer metastatic to multiple sites [facility])   11/16/2019 -  Chemotherapy   The patient had ondansetron (ZOFRAN) injection 8 mg, 8 mg (100 % of original dose 8 mg), Intravenous,  Once, 5 of 6 cycles Dose modification: 8 mg (original dose 8 mg, Cycle 1) pegfilgrastim-jmdb (FULPHILA) injection 6 mg, 6 mg, Subcutaneous,  Once, 5 of 6 cycles Administration: 6 mg (11/18/2019), 6 mg (12/09/2019), 6 mg (12/30/2019), 6 mg (01/20/2020) ondansetron (ZOFRAN) 8 mg in sodium chloride 0.9 % 50 mL IVPB, , Intravenous,  Once, 3 of 4 cycles Administration: 8 mg (12/28/2019), 8 mg (01/18/2020) DOCEtaxel (TAXOTERE) 140 mg in sodium chloride 0.9 % 250 mL chemo infusion, 75 mg/m2 = 140 mg, Intravenous,  Once, 5 of 6 cycles Administration: 140 mg (11/16/2019), 140 mg (12/07/2019), 140 mg (12/28/2019), 140 mg (01/18/2020)  for chemotherapy treatment.      CANCER STAGING: Cancer Staging No matching staging information was found for the patient.  INTERVAL HISTORY:  Scott [name], a 64 y.o. male, returns for routine follow-up and consideration for next cycle of chemotherapy. Scott [name] was last seen on 01/18/2020.  Due for cycle #5 of docetaxel today.   Today he is accompanied by his wife. Overall, he tells me he has been feeling okay. He reports that he had a dog bite on 11/18 on his right forearm and is currently taking Augmentin and prednisone 2.5 mg daily since; he will resume taking prednisone 5 mg once his bite injury heals.  He reports that he started getting numbness in toes of both feet after his last treatment, being both numb and painful, along with numbness in his fingers for 2 weeks; he currently reports having minor numbness in his fingertips. He reports that he started getting hot flashes. He had pain over the Fulphila injection, but it resolved. His appetite is good and he is taking an iron tablet daily. He does not check his BP at home regularly.  Overall, he feels ready for next cycle of chemo today.    REVIEW OF SYSTEMS:  Review of Systems  Constitutional: Positive for appetite change (75%) and fatigue (75%).  Endocrine: Positive for hot flashes.  Musculoskeletal: Positive for myalgias (R forearm dog bite).  Neurological: Positive for numbness (& pain in toes & fingers for 2 weeks post chemo).  All other systems reviewed and are negative.   PAST MEDICAL/SURGICAL HISTORY:  Past Medical History:  Diagnosis Date  . Bladder obstruction   . Dyspnea   . Essential hypertension, benign 01/26/2019   at [facility] office elevated, normal at home, no medications at this time  . History of basal cell cancer   . HLD (hyperlipidemia) 01/26/2019  . Hypothyroidism    history of, not currently taking medication  . Prostate cancer ([place])   . Vitamin D deficiency disease 01/26/2019   Past Surgical History:  Procedure Laterality Date  . CYSTOSCOPY W/ URETERAL STENT PLACEMENT Left 11/10/2019   Procedure: CYSTOSCOPY;  Surgeon: Irine Seal, MD;  Location: [facility];  Service: Urology;  Laterality: Left;  . HERNIA REPAIR  3149   UMBILICAL   . PROSTATE SURGERY    . SKIN CANCER EXCISION    . TONSILLECTOMY Bilateral   . TRANSURETHRAL RESECTION OF PROSTATE N/A 01/25/2017   Procedure: TRANSURETHRAL RESECTION OF THE PROSTATE (TURP);  Surgeon: Alexis Frock, MD;  Location: [facility];  Service: Urology;  Laterality: N/A;  . TRANSURETHRAL RESECTION OF PROSTATE N/A 11/10/2019   Procedure: TRANSURETHRAL RESECTION OF THE PROSTATE  (TURP);  Surgeon: Irine Seal, MD;  Location: [facility];  Service: Urology;  Laterality: N/A;  . WISDOM TOOTH EXTRACTION      SOCIAL HISTORY:  Social History   Socioeconomic History  . Marital status: Married    Spouse name: Not on file  . Number of children: 1  . Years of education: Not on file  . Highest education level: Not on file  Occupational History    Comment: working full time  Tobacco Use  . Smoking status: Former Smoker    Packs/day: 1.50    Years: 35.00    Pack years: 52.50    Types: Cigarettes    Quit date: 01/12/2004    Years since quitting: 16.0  . Smokeless tobacco: Never Used  . Tobacco comment: OVER 30 YEARS HX OF SMOKING   Vaping Use  . Vaping Use: Never used  Substance and Sexual Activity  . Alcohol use: No  . Drug use: Not Currently  . Sexual activity: Not Currently  Other Topics Concern  . Not on file  Social History Narrative   Married for 19 years.Works for [employer] from home.   Social Determinants of Health   Financial Resource Strain:   . Difficulty of Paying Living Expenses: Not on file  Food Insecurity:   . Worried About Charity fundraiser in the Last Year: Not on file  . Ran Out of Food in the Last Year: Not on file  Transportation Needs:   . Lack of Transportation (Medical): Not on file  . Lack of Transportation (Non-Medical): Not on file  Physical Activity:   . Days of Exercise per Week: Not on file  . Minutes of Exercise per Session: Not on file  Stress:   . Feeling of Stress : Not on file  Social Connections:   . Frequency of Communication with Friends and Family: Not on file  . Frequency of Social Gatherings with Friends and Family: Not on file  . Attends Religious Services: Not on file  . Active Member of Clubs or Organizations: Not on file  . Attends Archivist Meetings: Not on file  . Marital Status: Not on file  Intimate Partner Violence:   . Fear of Current or Ex-Partner: Not on file  . Emotionally  Abused: Not on file  . Physically Abused: Not on file  . Sexually Abused: Not on file    FAMILY HISTORY:  Family History  Problem Relation Age of Onset  . Emphysema Mother   . Alzheimer's disease Father   . Prostate cancer Father        dx in his 52s  . Prostate cancer Brother        dx in his 63s  . Breast cancer Maternal Grandmother   . Pancreatic cancer Maternal Grandfather   . Dementia Paternal Grandmother   . Heart attack Paternal Grandfather     CURRENT MEDICATIONS:  Current Outpatient Medications  Medication Sig Dispense Refill  . acetaminophen (TYLENOL) 500 MG tablet Take 1,000 mg by mouth at bedtime.    Marland Kitchen  amoxicillin-clavulanate (AUGMENTIN) 875-125 MG tablet Take 1 tablet by mouth every 12 (twelve) hours. 14 tablet 0  . ASTRAGALUS PO Take 1 tablet by mouth 2 (two) times daily.    Azucena Freed Serrata (BOSWELLIA PO) Take 1 capsule by mouth 2 (two) times daily.    . Cholecalciferol (D 5000) 5000 units capsule Take 5,000-10,000 Units by mouth See admin instructions. Take 10000 units in the morning and 5000 units at night    . cimetidine (TAGAMET) 200 MG tablet Take 200-400 mg by mouth daily as needed (inflammation).     . DOCETAXEL IV Inject 75 mg/m2 into the vein every 21 ( twenty-one) days.     Marland Kitchen dutasteride (AVODART) 0.5 MG capsule Take 1 capsule (0.5 mg total) by mouth daily. 30 capsule 11  . estradiol (ESTRACE) 2 MG tablet Take 1 tablet (2 mg total) by mouth daily. 30 tablet 3  . ibuprofen (ADVIL) 200 MG tablet Take 400 mg by mouth every 6 (six) hours as needed for moderate pain.    Marland Kitchen Leuprolide Acetate, 6 Month, (LUPRON DEPOT, 77-MONTH,) 45 MG injection Inject 45 mg into the muscle every 6 (six) months.    Marland Kitchen MAGNESIUM PO Take 1 tablet by mouth at bedtime.    Marland Kitchen MELATONIN PO Take 40 mg by mouth at bedtime.    Marland Kitchen MILK THISTLE PO Take 1 capsule by mouth daily.    . Misc Natural Products (TURMERIC CURCUMIN) CAPS Take 800 capsules by mouth 2 (two) times daily.    . mupirocin  ointment (BACTROBAN) 2 % Apply 1 application topically 2 (two) times daily. 22 g 0  . NATTOKINASE PO Take 2 capsules by mouth at bedtime.    . NP THYROID 30 MG tablet Take 1 tablet (30 mg total) by mouth daily before breakfast. 30 tablet 3  . OVER THE COUNTER MEDICATION Take 1 capsule by mouth 2 (two) times daily. [place] [employer] Mushroom  Anti-Cancer /Immune Scientist, product/process development     . OVER THE COUNTER MEDICATION Place 1 Dose under the tongue 2 (two) times daily. CBD oil daily     . OVER THE COUNTER MEDICATION Take 1 capsule by mouth daily. Gamma E otc supplement    . OVER THE COUNTER MEDICATION Take 1 capsule by mouth 2 (two) times daily. zyflamend otc supplement    . pegfilgrastim-jmdb (FULPHILA) 6 MG/0.6ML injection Inject 6 mg into the skin every 21 ( twenty-one) days. On Day 3 with each cycle of chemotherapy    . predniSONE (DELTASONE) 5 MG tablet Take 1 tablet (5 mg total) by mouth 2 (two) times daily with a meal. Take 1 tablet in the morning and 1 tablet at bedtime 60 tablet 3  . QUERCETIN PO Take 1 tablet by mouth 2 (two) times daily.    Marland Kitchen Resveratrol 250 MG CAPS Take 250 mg by mouth daily.    . vitamin B-12 (CYANOCOBALAMIN) 1000 MCG tablet Take 1,000 mcg by mouth daily.    Marland Kitchen VITAMIN K PO Take 1 capsule by mouth daily.    . Zinc 30 MG CAPS Take 30 mg by mouth daily.    . ondansetron (ZOFRAN ODT) 8 MG disintegrating tablet Take 1 tablet (8 mg total) by mouth every 8 (eight) hours as needed for nausea or vomiting. (Patient not taking: Reported on 02/08/2020) 20 tablet 3  . prochlorperazine (COMPAZINE) 10 MG tablet TAKE 1 TABLET(10 MG) BY MOUTH EVERY 6 HOURS AS NEEDED FOR NAUSEA OR VOMITING (Patient not taking: Reported on 02/08/2020) 60 tablet 3  No current facility-administered medications for this visit.   Facility-Administered Medications Ordered in Other Visits  Medication Dose Route Frequency Provider Last Rate Last Admin  . 0.9 %  sodium chloride infusion   Intravenous Once [name], MD       . dexamethasone (DECADRON) 10 mg in sodium chloride 0.9 % 50 mL IVPB  10 mg Intravenous Once [name], MD      . DOCEtaxel (TAXOTERE) 140 mg in sodium chloride 0.9 % 250 mL chemo infusion  75 mg/m2 (Treatment Plan Recorded) Intravenous Once [name], MD      . heparin lock flush 100 unit/mL  500 Units Intracatheter Once PRN [name], MD      . heparin lock flush 100 unit/mL  500 Units Intracatheter Once PRN [name], MD      . ondansetron (ZOFRAN) 8 mg in sodium chloride 0.9 % 50 mL IVPB   Intravenous Once [name], MD      . ondansetron [facility]) injection 8 mg  8 mg Intravenous Once [name], MD      . sodium chloride flush (NS) 0.9 % injection 10 mL  10 mL Intracatheter PRN [name], MD      . sodium chloride flush (NS) 0.9 % injection 10 mL  10 mL Intracatheter PRN [name], MD        ALLERGIES:  Allergies  Allergen Reactions  . Wellbutrin [Bupropion] Hives    PHYSICAL EXAM:  Performance status (ECOG): 0 - Asymptomatic  Vitals:   02/08/20 0951  BP: (!) 158/83  Pulse: 71  Resp: 18  Temp: (!) 97.2 F (36.2 C)  SpO2: 98%   Wt Readings from Last 3 Encounters:  02/08/20 177 lb 4 oz (80.4 kg)  01/27/20 179 lb 9.6 oz (81.5 kg)  01/18/20 179 lb 6.4 oz (81.4 kg)   Physical Exam Vitals reviewed.  Constitutional:      Appearance: Normal appearance.  Cardiovascular:     Rate and Rhythm: Normal rate and regular rhythm.     Pulses: Normal pulses.     Heart sounds: Normal heart sounds.  Pulmonary:     Effort: Pulmonary effort is normal.     Breath sounds: Normal breath sounds.  Musculoskeletal:     Right lower leg: No edema.     Left lower leg: No edema.  Neurological:     General: No focal deficit present.     Mental Status: He is alert and oriented to person, place, and time.  Psychiatric:        Mood and Affect: Mood normal.        Behavior: Behavior normal.      LABORATORY DATA:  I have reviewed the labs as listed.  CBC Latest Ref Rng & Units 02/08/2020 01/18/2020 12/28/2019  WBC 4.0 - 10.5 K/uL 5.5 6.4 6.4  Hemoglobin 13.0 - 17.0 g/dL 11.7(L) 11.6(L) 12.0(L)  Hematocrit 39 - 52 % 34.2(L) 35.0(L) 34.9(L)  Platelets 150 - 400 K/uL 314 286 304   CMP Latest Ref Rng & Units 02/08/2020 01/27/2020 01/18/2020  Glucose 70 - 99 mg/dL 116(H) 97 96  BUN 8 - 23 mg/dL 13 12 15   Creatinine 0.61 - 1.24 mg/dL 0.90 0.98 0.86  Sodium 135 - 145 mmol/L 131(L) 138 131(L)  Potassium 3.5 - 5.1 mmol/L 3.7 4.5 3.9  Chloride 98 - 111 mmol/L 100 102 99  CO2 22 - 32 mmol/L 23 28 25   Calcium 8.9 - 10.3 mg/dL 9.3 9.2 9.0  Total Protein 6.5 -  8.1 g/dL 6.6 6.3 6.4(L)  Total Bilirubin 0.3 - 1.2 mg/dL 0.8 0.5 1.0  Alkaline Phos 38 - 126 U/L 53 - 48  AST 15 - 41 U/L 15 15 16   ALT 0 - 44 U/L 14 9 14     DIAGNOSTIC IMAGING:  I have independently reviewed the scans and discussed with the patient. NM Bone Scan Whole Body  Result Date: 01/15/2020 CLINICAL DATA:  Prostate cancer, post chemotherapy EXAM: NUCLEAR MEDICINE WHOLE BODY BONE SCAN TECHNIQUE: Whole body anterior and posterior images were obtained approximately 3 hours after intravenous injection of radiopharmaceutical. RADIOPHARMACEUTICALS:  19.7 mCi Technetium-88m MDP IV COMPARISON:  10/27/2019 Correlation: CT abdomen pelvis 01/14/2020 FINDINGS: Uptake at LEFT L2, unchanged, corresponding to sclerotic metastatic lesion on CT. Mild uptake at shoulders, sternoclavicular joints, hips, LEFT foot, typically degenerative. Uptake at lateral aspects of lower lumbar spine bilaterally, typically degenerative and corresponding to facet degenerative changes on CT. No additional sites of abnormal osseous tracer accumulation. Nonspecific vague asymmetric tracer in soft tissues of RIGHT chest wall unchanged. IMPRESSION: Stable LEFT L2 osseous metastatic focus. No new scintigraphic abnormalities identified. Electronically Signed   By:  Lavonia Dana M.D.   On: 01/15/2020 09:27   CT Abdomen Pelvis W Contrast  Result Date: 01/14/2020 CLINICAL DATA:  Follow-up prostate cancer, ongoing chemotherapy, status post interval TURP EXAM: CT ABDOMEN AND PELVIS WITH CONTRAST TECHNIQUE: Multidetector CT imaging of the abdomen and pelvis was performed using the standard protocol following bolus administration of intravenous contrast. CONTRAST:  163mL OMNIPAQUE IOHEXOL 300 MG/ML SOLN, additional oral enteric contrast COMPARISON:  11/03/2019 FINDINGS: Lower chest: No acute abnormality. Hepatobiliary: No solid liver abnormality is seen. No gallstones, gallbladder wall thickening, or biliary dilatation. Pancreas: Unremarkable. No pancreatic ductal dilatation or surrounding inflammatory changes. Spleen: Normal in size without significant abnormality. Adrenals/Urinary Tract: Adrenal glands are unremarkable. Left hydronephrosis is significantly reduced compared to prior examination, with mild, persistent bilateral hydronephrosis. Thickening of the urinary bladder wall. Stomach/Bowel: Stomach is within normal limits. Appendix is not clearly visualized and may be diminutive or surgically absent. No evidence of bowel wall thickening, distention, or inflammatory changes. Sigmoid diverticulosis. Vascular/Lymphatic: Aortic atherosclerosis. Prominent, stranded left-sided retroperitoneal lymph nodes are reduced in size, an index node measuring 0.5 x 0.5 cm, previously 1.0 x 1.0 cm. Prominent left iliac lymph nodes are slightly decreased in size, an index node measuring 0.7 x 0.6 cm, previously 0.9 x 0.7 cm (series 2, image 59). Subcentimeter right iliac lymph nodes not significantly changed (series 2, image 60). Reproductive: Interval TURP with nodular resection of soft tissue near the bladder base seen on prior examination (series 2, image 69). Other: Unchanged fat containing midline ventral epigastric hernia (series 2, image 36). No abdominopelvic ascites.  Musculoskeletal: Interval increase in sclerosis of lesions of the left aspect of the L2 vertebral body (series 2, image 23). Very subtle sclerotic lesion in the left iliac wing, unchanged compared to prior examination (series 2, image 45). IMPRESSION: 1. Interval TURP with nodular resection of soft tissue near the bladder base seen on prior examination. Persistent thickening of the urinary bladder wall. 2. Left hydronephrosis is significantly reduced compared to prior examination, with mild, persistent bilateral hydronephrosis. 3. Prominent, stranded left-sided retroperitoneal and left iliac lymph nodes are reduced in size, consistent with treatment response. 4. Interval increase in sclerosis of lesions of the left aspect of the L2 vertebral body, consistent with treatment response of osseous metastatic disease. Very subtle sclerotic lesion in the left iliac wing, unchanged compared to prior  examination. No new osseous lesions appreciated. Please see forthcoming nuclear scintigraphic bone scan for more sensitive assessment of osseous metastatic disease. 5. Aortic Atherosclerosis (ICD10-I70.0). Electronically Signed   By: Eddie Candle M.D.   On: 01/14/2020 16:24     ASSESSMENT:  1. 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 Prominent left hydronephrosis and hydroureter noted. New sclerotic lesion in the left anterior vertebral body of L1. Mild left periaortic adenopathy increased from prior, short axis lymph node measuring 1 cm. -Last PSA increased to 15.8 on 09/28/2019. -As there is rapid progression of disease, I have recommended docetaxel chemotherapy. He is agreeable after long discussion. -Cystoscopy with transurethral resection of tumor in the posterior bladder neck, right and left walls of the bladder, right lobe of the prostate on  11/10/2019. -Plan was to do 6 cycles of docetaxel followed by abiraterone and prednisone. -Cycle 1 of docetaxel on 11/16/2019. -CTAP on 01/06/2020 showed persistent thickening of the urinary bladder wall.  Left hydronephrosis is significantly reduced compared to prior exam.  Left-sided retroperitoneal and left iliac lymph nodes reduced in size.  Interval increase in sclerosis of lesions of the left aspect of L2 vertebral body consistent with treatment response.  No new bone lesions. -Bone scan on 01/06/2020 shows stable L2 metastatic focus.  2. Left hydroureteronephrosis: -CT scan showed prostate mass invading the posterior bladder and obstructing the ureter. Creatinine increased to 1.1. -Renal ultrasound on 12/11/2019 showed interval resolution of previously seen left-sided hydronephrosis. Previously seen soft tissue mass at the posterior bladder lumen no longer seen.   PLAN:  1. 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  He is taking Augmentin. -His PSA last time was 4.20. -Reviewed his CBC which are grossly within normal limits.  LFTs were also normal. -Because of his neuropathy, will cut back on docetaxel by 20%. -He will come back in 3 weeks for cycle 6. -I will see him back in 6 to 8 weeks.  We plan to stop docetaxel after 6 cycles.  We will consider scans if there is any worsening of PSA.  We will consider starting him on Abiraterone at that time. -He reports me that he is considering high-dose estrogens in place of Lupron when he is due next time.  2. Left hydroureteronephrosis: -Creatinine is staying low at 0.9.  Improvement in the hydronephrosis on the most recent CT scan.  3. Hot flashes: -He was started on estradiol 2 mg tablet daily by Dr. Anastasio Champion.  4.  Peripheral neuropathy: -He has more numbness in the feet and fingertips which lasted about 2 weeks.  He also had achy pain. -In the last week the numbness has  improved. -We will cut back on Taxol by 20%.   Orders placed this encounter:  No orders of the defined types were placed in this encounter.    [name], MD [place] [phone]   I, Milinda Antis, am acting as a scribe for Dr. Sanda Linger.  I, [name] MD, have reviewed the above documentation for accuracy and completeness, and I agree with the above.

## 2020-02-08 NOTE — Patient Instructions (Signed)
[facility] Discharge Instructions for Patients Receiving Chemotherapy  Today you received the following chemotherapy agents   To help prevent nausea and vomiting after your treatment, we encourage you to take your nausea medication   If you develop nausea and vomiting that is not controlled by your nausea medication, call the clinic.   BELOW ARE SYMPTOMS THAT SHOULD BE REPORTED IMMEDIATELY:  *FEVER GREATER THAN 100.5 F  *CHILLS WITH OR WITHOUT FEVER  NAUSEA AND VOMITING THAT IS NOT CONTROLLED WITH YOUR NAUSEA MEDICATION  *UNUSUAL SHORTNESS OF BREATH  *UNUSUAL BRUISING OR BLEEDING  TENDERNESS IN MOUTH AND THROAT WITH OR WITHOUT PRESENCE OF ULCERS  *URINARY PROBLEMS  *BOWEL PROBLEMS  UNUSUAL RASH Items with * indicate a potential emergency and should be followed up as soon as possible.  Feel free to call the clinic should you have any questions or concerns. The clinic phone number is [phone].  Please show the CHEMO ALERT CARD at check-in to the Emergency Department and triage nurse.   

## 2020-02-08 NOTE — Progress Notes (Signed)
Patient was assessed by Dr. Delton Coombes and labs have been reviewed.  He is developing neuropathy in fingers and toes, Dr. Delton Coombes will dose reduce.  Patient is okay to proceed with treatment today. Primary RN and pharmacy aware.

## 2020-02-08 NOTE — Progress Notes (Signed)
Received note from MD to dose reduce docetaxel by 20% = 60 mg/m2  please decrease docetaxel dose by 20%. couldn't do it as it has been released,   T.O. Dr Rhys Martini, PharmD

## 2020-02-10 ENCOUNTER — Encounter (HOSPITAL_COMMUNITY): Payer: Self-pay

## 2020-02-10 ENCOUNTER — Inpatient Hospital Stay (HOSPITAL_COMMUNITY): Payer: BC Managed Care – PPO

## 2020-02-10 ENCOUNTER — Other Ambulatory Visit: Payer: Self-pay

## 2020-02-10 VITALS — BP 141/78 | HR 71 | Temp 96.8°F | Resp 18

## 2020-02-10 DIAGNOSIS — Z5111 Encounter for antineoplastic chemotherapy: Secondary | ICD-10-CM | POA: Diagnosis not present

## 2020-02-10 DIAGNOSIS — C61 Malignant neoplasm of prostate: Secondary | ICD-10-CM

## 2020-02-10 MED ORDER — PEGFILGRASTIM-JMDB 6 MG/0.6ML ~~LOC~~ SOSY
6.0000 mg | PREFILLED_SYRINGE | Freq: Once | SUBCUTANEOUS | Status: AC
  Administered 2020-02-10: 6 mg via SUBCUTANEOUS
  Filled 2020-02-10: qty 0.6

## 2020-02-10 NOTE — Progress Notes (Signed)
Patient tolerated injection with no complaints voiced.  Site clean and dry with no bruising or swelling noted at site.  Band aid applied.  Vss with discharge and left in satisfactory condition with no s/s of distress noted.  

## 2020-02-23 ENCOUNTER — Ambulatory Visit (INDEPENDENT_AMBULATORY_CARE_PROVIDER_SITE_OTHER): Payer: BC Managed Care – PPO | Admitting: Internal Medicine

## 2020-02-28 NOTE — Progress Notes (Signed)
Scott [name], [place] [id number]   CLINIC:  Medical Oncology/Hematology  PCP:  Doree Albee, MD [place] / [place] [address] [phone]   REASON FOR VISIT:  Follow-up for metastatic prostate cancer  PRIOR THERAPY: TURP on 11/10/2019  NGS Results: Not done  CURRENT THERAPY: Docetaxel every 3 weeks  BRIEF ONCOLOGIC HISTORY:  Oncology History  Prostate cancer metastatic to multiple sites [facility])  01/25/2017 Initial Diagnosis   Prostate cancer metastatic to multiple sites [facility])   11/16/2019 -  Chemotherapy   The patient had ondansetron (ZOFRAN) injection 8 mg, 8 mg (100 % of original dose 8 mg), Intravenous,  Once, 6 of 6 cycles Dose modification: 8 mg (original dose 8 mg, Cycle 1) pegfilgrastim-jmdb (FULPHILA) injection 6 mg, 6 mg, Subcutaneous,  Once, 6 of 6 cycles Administration: 6 mg (11/18/2019), 6 mg (12/09/2019), 6 mg (12/30/2019), 6 mg (01/20/2020), 6 mg (02/10/2020) ondansetron (ZOFRAN) 8 mg in sodium chloride 0.9 % 50 mL IVPB, , Intravenous,  Once, 4 of 4 cycles Administration: 8 mg (12/28/2019), 8 mg (01/18/2020) DOCEtaxel (TAXOTERE) 140 mg in sodium chloride 0.9 % 250 mL chemo infusion, 75 mg/m2 = 140 mg, Intravenous,  Once, 6 of 6 cycles Administration: 140 mg (11/16/2019), 140 mg (12/07/2019), 140 mg (12/28/2019), 140 mg (01/18/2020), 110 mg (02/08/2020)  for chemotherapy treatment.      CANCER STAGING: Cancer Staging No matching staging information was found for the patient.  INTERVAL HISTORY:  Scott [name], a 64 y.o. male, returns for routine follow-up and consideration for next cycle of chemotherapy. Scott [name] was last seen on 01/18/2020.  Due for cycle # 6 of docetaxel today.   He is doing well today. Baseline neuropathy left foot, last 3 toes, not interfering with ADL Some SOB with exertion. Would like to continue current dose of chemotherapy No nausea, vomiting, diarrhea. No new bone pains.   REVIEW OF  SYSTEMS:  Review of Systems  Constitutional: Positive for fatigue. Negative for appetite change.  Respiratory: Positive for shortness of breath.   Endocrine: Positive for hot flashes.  Musculoskeletal: Negative for myalgias.  Neurological: Positive for numbness (& pain in toes & fingers for 2 weeks post chemo).  All other systems reviewed and are negative.   PAST MEDICAL/SURGICAL HISTORY:  Past Medical History:  Diagnosis Date  . Bladder obstruction   . Dyspnea   . Essential hypertension, benign 01/26/2019   at [place] office elevated, normal at home, no medications at this time  . History of basal cell cancer   . HLD (hyperlipidemia) 01/26/2019  . Hypothyroidism    history of, not currently taking medication  . Prostate cancer ([place])   . Vitamin D deficiency disease 01/26/2019   Past Surgical History:  Procedure Laterality Date  . CYSTOSCOPY W/ URETERAL STENT PLACEMENT Left 11/10/2019   Procedure: CYSTOSCOPY;  Surgeon: Irine Seal, MD;  [facility]: [facility];  Service: Urology;  Laterality: Left;  . HERNIA REPAIR  [id number]   UMBILICAL   . PROSTATE SURGERY    . SKIN CANCER EXCISION    . TONSILLECTOMY Bilateral   . TRANSURETHRAL RESECTION OF PROSTATE N/A 01/25/2017   Procedure: TRANSURETHRAL RESECTION OF THE PROSTATE (TURP);  Surgeon: Alexis Frock, MD;  Location[facility];  Service: Urology;  Laterality: N/A;  . TRANSURETHRAL RESECTION OF PROSTATE N/A 11/10/2019   Procedure: TRANSURETHRAL RESECTION OF THE PROSTATE (TURP);  Surgeon: Irine Seal, MD;  [facility];  Service: Urology;  Laterality: N/A;  .  WISDOM TOOTH EXTRACTION      SOCIAL HISTORY:  Social History   Socioeconomic History  . Marital status: Married    Spouse name: Not on file  . Number of children: 1  . Years of education: Not on file  . Highest education level: Not on file  Occupational History    Comment: working full time  Tobacco Use  . Smoking status: Former Smoker    Packs/day: 1.50    Years: 35.00     Pack years: 52.50    Types: Cigarettes    Quit date: 01/12/2004    Years since quitting: 16.1  . Smokeless tobacco: Never Used  . Tobacco comment: OVER 30 YEARS HX OF SMOKING   Vaping Use  . Vaping Use: Never used  Substance and Sexual Activity  . Alcohol use: No  . Drug use: Not Currently  . Sexual activity: Not Currently  Other Topics Concern  . Not on file  Social History Narrative   Married for 19 years.Works for [employer] from home.   Social Determinants of Health   Financial Resource Strain: Not on file  Food Insecurity: Not on file  Transportation Needs: Not on file  Physical Activity: Not on file  Stress: Not on file  Social Connections: Not on file  Intimate Partner Violence: Not on file    FAMILY HISTORY:  Family History  Problem Relation Age of Onset  . Emphysema Mother   . Alzheimer's disease Father   . Prostate cancer Father        dx in his 15s  . Prostate cancer Brother        dx in his 24s  . Breast cancer Maternal Grandmother   . Pancreatic cancer Maternal Grandfather   . Dementia Paternal Grandmother   . Heart attack Paternal Grandfather     CURRENT MEDICATIONS:  Current Outpatient Medications  Medication Sig Dispense Refill  . acetaminophen (TYLENOL) 500 MG tablet Take 1,000 mg by mouth at bedtime.    . ASTRAGALUS PO Take 1 tablet by mouth 2 (two) times daily.    Azucena Freed [place]errata (BOSWELLIA PO) Take 1 capsule by mouth 2 (two) times daily.    . Cholecalciferol 125 MCG (5000 UT) capsule Take 5,000-10,000 Units by mouth See admin instructions. Take 10000 units in the morning and 5000 units at night    . cimetidine (TAGAMET) 200 MG tablet Take 200-400 mg by mouth daily as needed (inflammation).     . DOCETAXEL IV Inject 75 mg/m2 into the vein every 21 ( twenty-one) days.     Marland Kitchen dutasteride (AVODART) 0.5 MG capsule Take 1 capsule (0.5 mg total) by mouth daily. 30 capsule 11  . estradiol (ESTRACE) 2 MG tablet Take 1 tablet (2 mg total) by  mouth daily. 30 tablet 3  . ibuprofen (ADVIL) 200 MG tablet Take 400 mg by mouth every 6 (six) hours as needed for moderate pain.    Marland Kitchen Leuprolide Acetate, 6 Month, (LUPRON DEPOT, 70-MONTH,) 45 MG injection Inject 45 mg into the muscle every 6 (six) months.    Marland Kitchen MAGNESIUM PO Take 1 tablet by mouth at bedtime.    Marland Kitchen MELATONIN PO Take 40 mg by mouth at bedtime.    Marland Kitchen MILK THISTLE PO Take 1 capsule by mouth daily.    . Misc Natural Products (TURMERIC CURCUMIN) CAPS Take 800 capsules by mouth 2 (two) times daily.    . mupirocin ointment (BACTROBAN) 2 % Apply 1 application topically 2 (two) times daily. [place]  g 0  . NATTOKINASE PO Take 2 capsules by mouth at bedtime.    . NP THYROID 30 MG tablet Take 1 tablet (30 mg total) by mouth daily before breakfast. 30 tablet 3  . ondansetron (ZOFRAN ODT) 8 MG disintegrating tablet Take 1 tablet (8 mg total) by mouth every 8 (eight) hours as needed for nausea or vomiting. 20 tablet 3  . OVER THE COUNTER MEDICATION Take 1 capsule by mouth 2 (two) times daily. [place] [employer] Mushroom  Anti-Cancer /Immune Scientist, product/process development    . OVER THE COUNTER MEDICATION Place 1 Dose under the tongue 2 (two) times daily. CBD oil daily    . OVER THE COUNTER MEDICATION Take 1 capsule by mouth daily. Gamma E otc supplement    . OVER THE COUNTER MEDICATION Take 1 capsule by mouth 2 (two) times daily. zyflamend otc supplement    . pegfilgrastim-jmdb (FULPHILA) 6 MG/0.6ML injection Inject 6 mg into the skin every 21 ( twenty-one) days. On Day 3 with each cycle of chemotherapy    . predniSONE (DELTASONE) 5 MG tablet Take 1 tablet (5 mg total) by mouth 2 (two) times daily with a meal. Take 1 tablet in the morning and 1 tablet at bedtime 60 tablet 3  . prochlorperazine (COMPAZINE) 10 MG tablet TAKE 1 TABLET(10 MG) BY MOUTH EVERY 6 HOURS AS NEEDED FOR NAUSEA OR VOMITING 60 tablet 3  . QUERCETIN PO Take 1 tablet by mouth 2 (two) times daily.    Marland Kitchen Resveratrol 250 MG CAPS Take 250 mg by mouth daily.    . vitamin  B-12 (CYANOCOBALAMIN) 1000 MCG tablet Take 1,000 mcg by mouth daily.    Marland Kitchen VITAMIN K PO Take 1 capsule by mouth daily.    . Zinc 30 MG CAPS Take 30 mg by mouth daily.     No current facility-administered medications for this visit.   Facility-Administered Medications Ordered in Other Visits  Medication Dose Route Frequency Provider Last Rate Last Admin  . 0.9 %  sodium chloride infusion   Intravenous Once [name], MD      . dexamethasone (DECADRON) 10 mg in sodium chloride 0.9 % 50 mL IVPB  10 mg Intravenous Once [name], MD      . DOCEtaxel (TAXOTERE) 110 mg in sodium chloride 0.9 % 500 mL chemo infusion  60 mg/m2 (Treatment Plan Recorded) Intravenous Once [name], MD      . heparin lock flush 100 unit/mL  500 Units Intracatheter Once PRN [name], MD      . heparin lock flush 100 unit/mL  500 Units Intracatheter Once PRN [name], MD      . ondansetron [facility]) 8 mg in sodium chloride 0.9 % 50 mL IVPB   Intravenous Once [name], MD      . sodium chloride flush (NS) 0.9 % injection 10 mL  10 mL Intracatheter PRN [name], MD      . sodium chloride flush (NS) 0.9 % injection 10 mL  10 mL Intracatheter PRN [name], MD        ALLERGIES:  Allergies  Allergen Reactions  . Wellbutrin [Bupropion] Hives    PHYSICAL EXAM:  Performance status (ECOG): 0 - Asymptomatic  Vitals:   02/29/20 0916  BP: (!) 145/85  Pulse: 67  Resp: 17  Temp: 98.5 F (36.9 C)  SpO2: 95%   Wt Readings from Last 3 Encounters:  02/08/20 177 lb 4 oz (80.4 kg)  01/27/20 179 lb 9.6 oz (81.5 kg)  01/18/20 179 lb 6.4 oz (81.4 kg)   Physical Exam Vitals reviewed.  Constitutional:      Appearance: Normal appearance.  Cardiovascular:     Rate and Rhythm: Normal rate and regular rhythm.     Pulses: Normal pulses.     Heart sounds: Normal heart sounds.  Pulmonary:     Effort: Pulmonary effort is normal.     Breath  sounds: Normal breath sounds.  Musculoskeletal:     Right lower leg: No edema.     Left lower leg: No edema.  Neurological:     General: No focal deficit present.     Mental Status: He is alert and oriented to person, place, and time.  Psychiatric:        Mood and Affect: Mood normal.        Behavior: Behavior normal.     LABORATORY DATA:  I have reviewed the labs as listed.  CBC Latest Ref Rng & Units 02/29/2020 02/08/2020 01/18/2020  WBC 4.0 - 10.5 K/uL 4.8 5.5 6.4  Hemoglobin 13.0 - 17.0 g/dL 11.9(L) 11.7(L) 11.6(L)  Hematocrit 39.0 - 52.0 % 35.8(L) 34.2(L) 35.0(L)  Platelets 150 - 400 K/uL 315 314 286   CMP Latest Ref Rng & Units 02/29/2020 02/08/2020 01/27/2020  Glucose 70 - 99 mg/dL 83 116(H) 97  BUN 8 - 23 mg/dL 15 13 12   Creatinine 0.61 - 1.24 mg/dL 0.96 0.90 0.98  Sodium 135 - 145 mmol/L 132(L) 131(L) 138  Potassium 3.5 - 5.1 mmol/L 4.0 3.7 4.5  Chloride 98 - 111 mmol/L 101 100 102  CO2 22 - 32 mmol/L 23 23 28   Calcium 8.9 - 10.3 mg/dL 9.2 9.3 9.2  Total Protein 6.5 - 8.1 g/dL 6.4(L) 6.6 6.3  Total Bilirubin 0.3 - 1.2 mg/dL 1.1 0.8 0.5  Alkaline Phos 38 - 126 U/L 53 53 -  AST 15 - 41 U/L 14(L) 15 15  ALT 0 - 44 U/L 12 14 9     DIAGNOSTIC IMAGING:  I have independently reviewed the scans and discussed with the patient. No results found.   ASSESSMENT:   1. 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 Prominent left hydronephrosis and hydroureter noted. New sclerotic lesion in the left anterior vertebral body of L1. Mild left periaortic adenopathy increased from prior, short axis lymph node measuring 1 cm. -Last PSA increased to 15.8 on 09/28/2019. -As there is rapid progression of disease, I have recommended docetaxel chemotherapy. He is agreeable after long discussion. -Cystoscopy with transurethral  resection of tumor in the posterior bladder neck, right and left walls of the bladder, right lobe of the prostate on 11/10/2019. -Plan was to do 6 cycles of docetaxel followed by abiraterone and prednisone. -Cycle 1 of docetaxel on 11/16/2019. -CTAP on 01/06/2020 showed persistent thickening of the urinary bladder wall.  Left hydronephrosis is significantly reduced compared to prior exam.  Left-sided retroperitoneal and left iliac lymph nodes reduced in size.  Interval increase in sclerosis of lesions of the left aspect of L2 vertebral body consistent with treatment response.  No new bone lesions. -Bone scan on 01/06/2020 shows stable L2 metastatic focus.  2. Left hydroureteronephrosis:   PLAN:   1. 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  LFTs were also normal. -  Because of his neuropathy, continue docetaxel at 60 mg/m2. -We plan to stop docetaxel after 6 cycles.  We will consider scans if there is any worsening of PSA.  We will consider starting him on Abiraterone at that time.  2. Left hydroureteronephrosis: -Creatinine is staying low at 0.9.    3. Hot flashes: -He was started on estradiol 2 mg tablet daily by Dr. Anastasio Champion.  4.  Peripheral neuropathy: grade 1, no worsening.    Orders placed this encounter:  No orders of the defined types were placed in this encounter.  Time  I spent 30 minutes in the care of this patient, including H and P, review of medical records, counseling and coordination of care.  Benay Pike MD

## 2020-02-29 ENCOUNTER — Other Ambulatory Visit: Payer: Self-pay

## 2020-02-29 ENCOUNTER — Inpatient Hospital Stay (HOSPITAL_BASED_OUTPATIENT_CLINIC_OR_DEPARTMENT_OTHER): Payer: BC Managed Care – PPO | Admitting: Hematology and Oncology

## 2020-02-29 ENCOUNTER — Encounter (HOSPITAL_COMMUNITY): Payer: Self-pay | Admitting: Hematology and Oncology

## 2020-02-29 ENCOUNTER — Inpatient Hospital Stay (HOSPITAL_COMMUNITY): Payer: BC Managed Care – PPO

## 2020-02-29 ENCOUNTER — Inpatient Hospital Stay (HOSPITAL_COMMUNITY): Payer: BC Managed Care – PPO | Attending: Hematology and Oncology

## 2020-02-29 VITALS — BP 145/85 | HR 67 | Temp 98.5°F | Resp 17

## 2020-02-29 VITALS — BP 150/77 | HR 62 | Temp 97.0°F | Resp 18

## 2020-02-29 DIAGNOSIS — Z800 Family history of malignant neoplasm of digestive organs: Secondary | ICD-10-CM | POA: Insufficient documentation

## 2020-02-29 DIAGNOSIS — C61 Malignant neoplasm of prostate: Secondary | ICD-10-CM | POA: Insufficient documentation

## 2020-02-29 DIAGNOSIS — Z79899 Other long term (current) drug therapy: Secondary | ICD-10-CM | POA: Diagnosis not present

## 2020-02-29 DIAGNOSIS — C7951 Secondary malignant neoplasm of bone: Secondary | ICD-10-CM | POA: Diagnosis not present

## 2020-02-29 DIAGNOSIS — Z8042 Family history of malignant neoplasm of prostate: Secondary | ICD-10-CM | POA: Insufficient documentation

## 2020-02-29 DIAGNOSIS — Z5189 Encounter for other specified aftercare: Secondary | ICD-10-CM | POA: Diagnosis not present

## 2020-02-29 DIAGNOSIS — G629 Polyneuropathy, unspecified: Secondary | ICD-10-CM | POA: Insufficient documentation

## 2020-02-29 DIAGNOSIS — Z87891 Personal history of nicotine dependence: Secondary | ICD-10-CM | POA: Insufficient documentation

## 2020-02-29 DIAGNOSIS — R232 Flushing: Secondary | ICD-10-CM | POA: Insufficient documentation

## 2020-02-29 DIAGNOSIS — Z85828 Personal history of other malignant neoplasm of skin: Secondary | ICD-10-CM | POA: Diagnosis not present

## 2020-02-29 DIAGNOSIS — R0602 Shortness of breath: Secondary | ICD-10-CM | POA: Diagnosis not present

## 2020-02-29 DIAGNOSIS — N1330 Unspecified hydronephrosis: Secondary | ICD-10-CM | POA: Insufficient documentation

## 2020-02-29 DIAGNOSIS — E559 Vitamin D deficiency, unspecified: Secondary | ICD-10-CM | POA: Diagnosis not present

## 2020-02-29 DIAGNOSIS — Z5111 Encounter for antineoplastic chemotherapy: Secondary | ICD-10-CM | POA: Diagnosis not present

## 2020-02-29 DIAGNOSIS — R5383 Other fatigue: Secondary | ICD-10-CM | POA: Insufficient documentation

## 2020-02-29 DIAGNOSIS — Z803 Family history of malignant neoplasm of breast: Secondary | ICD-10-CM | POA: Diagnosis not present

## 2020-02-29 DIAGNOSIS — E785 Hyperlipidemia, unspecified: Secondary | ICD-10-CM | POA: Insufficient documentation

## 2020-02-29 DIAGNOSIS — C778 Secondary and unspecified malignant neoplasm of lymph nodes of multiple regions: Secondary | ICD-10-CM | POA: Insufficient documentation

## 2020-02-29 LAB — COMPREHENSIVE METABOLIC PANEL
ALT: 12 U/L (ref 0–44)
AST: 14 U/L — ABNORMAL LOW (ref 15–41)
Albumin: 3.9 g/dL (ref 3.5–5.0)
Alkaline Phosphatase: 53 U/L (ref 38–126)
Anion gap: 8 (ref 5–15)
BUN: 15 mg/dL (ref 8–23)
CO2: 23 mmol/L (ref 22–32)
Calcium: 9.2 mg/dL (ref 8.9–10.3)
Chloride: 101 mmol/L (ref 98–111)
Creatinine, Ser: 0.96 mg/dL (ref 0.61–1.24)
GFR, Estimated: >60 mL/min (ref >60)
Glucose, Bld: 83 mg/dL (ref 70–99)
Potassium: 4 mmol/L (ref 3.5–5.1)
Sodium: 132 mmol/L — ABNORMAL LOW (ref 135–145)
Total Bilirubin: 1.1 mg/dL (ref 0.3–1.2)
Total Protein: 6.4 g/dL — ABNORMAL LOW (ref 6.5–8.1)

## 2020-02-29 LAB — CBC WITH DIFFERENTIAL/PLATELET
Abs Immature Granulocytes: 0.01 10*3/uL (ref 0.00–0.07)
Basophils Absolute: 0.2 10*3/uL — ABNORMAL HIGH (ref 0.0–0.1)
Basophils Relative: 4 %
Eosinophils Absolute: 0 10*3/uL (ref 0.0–0.5)
Eosinophils Relative: 1 %
HCT: 35.8 % — ABNORMAL LOW (ref 39.0–52.0)
Hemoglobin: 11.9 g/dL — ABNORMAL LOW (ref 13.0–17.0)
Immature Granulocytes: 0 %
Lymphocytes Relative: 29 %
Lymphs Abs: 1.4 10*3/uL (ref 0.7–4.0)
MCH: 32.5 pg (ref 26.0–34.0)
MCHC: 33.2 g/dL (ref 30.0–36.0)
MCV: 97.8 fL (ref 80.0–100.0)
Monocytes Absolute: 0.7 10*3/uL (ref 0.1–1.0)
Monocytes Relative: 15 %
Neutro Abs: 2.5 10*3/uL (ref 1.7–7.7)
Neutrophils Relative %: 51 %
Platelets: 315 10*3/uL (ref 150–400)
RBC: 3.66 MIL/uL — ABNORMAL LOW (ref 4.22–5.81)
RDW: 13.7 % (ref 11.5–15.5)
WBC: 4.8 10*3/uL (ref 4.0–10.5)
nRBC: 0 % (ref 0.0–0.2)

## 2020-02-29 LAB — PSA: Prostatic Specific Antigen: 3.11 ng/mL (ref 0.00–4.00)

## 2020-02-29 MED ORDER — SODIUM CHLORIDE 0.9 % IV SOLN
10.0000 mg | Freq: Once | INTRAVENOUS | Status: AC
  Administered 2020-02-29: 10:00:00 10 mg via INTRAVENOUS
  Filled 2020-02-29: qty 10

## 2020-02-29 MED ORDER — SODIUM CHLORIDE 0.9 % IV SOLN: Freq: Once | INTRAVENOUS | Status: AC

## 2020-02-29 MED ORDER — HEPARIN SOD (PORK) LOCK FLUSH 100 UNIT/ML IV SOLN: 500.0000 [IU] | Freq: Once | INTRAVENOUS | Status: DC | PRN

## 2020-02-29 MED ORDER — SODIUM CHLORIDE 0.9 % IV SOLN
Freq: Once | INTRAVENOUS | Status: AC
  Administered 2020-02-29: 10:00:00 8 mg via INTRAVENOUS
  Filled 2020-02-29: qty 4

## 2020-02-29 MED ORDER — SODIUM CHLORIDE 0.9% FLUSH
10.0000 mL | INTRAVENOUS | Status: DC | PRN
  Administered 2020-02-29: 10:00:00 10 mL

## 2020-02-29 MED ORDER — SODIUM CHLORIDE 0.9 % IV SOLN
60.0000 mg/m2 | Freq: Once | INTRAVENOUS | Status: AC
  Administered 2020-02-29: 11:00:00 110 mg via INTRAVENOUS
  Filled 2020-02-29: qty 11

## 2020-02-29 NOTE — Patient Instructions (Signed)
[facility] Discharge Instructions for Patients Receiving Chemotherapy   Beginning January 23rd 2017 lab work for the [facility] will be done in the  Main lab at [facility] on 1st floor. If you have a lab appointment with the [facility] please come in thru the  Main Entrance and check in at the main information desk   Today you received the following chemotherapy agents Docetaxel  To help prevent nausea and vomiting after your treatment, we encourage you to take your nausea medication    If you develop nausea and vomiting, or diarrhea that is not controlled by your medication, call the clinic.  The clinic phone number is [phone]. Office hours are Monday-Friday 8:30am-5:00pm.  BELOW ARE SYMPTOMS THAT SHOULD BE REPORTED IMMEDIATELY:  *FEVER GREATER THAN 101.0 F  *CHILLS WITH OR WITHOUT FEVER  NAUSEA AND VOMITING THAT IS NOT CONTROLLED WITH YOUR NAUSEA MEDICATION  *UNUSUAL SHORTNESS OF BREATH  *UNUSUAL BRUISING OR BLEEDING  TENDERNESS IN MOUTH AND THROAT WITH OR WITHOUT PRESENCE OF ULCERS  *URINARY PROBLEMS  *BOWEL PROBLEMS  UNUSUAL RASH Items with * indicate a potential emergency and should be followed up as soon as possible. If you have an emergency after office hours please contact your primary care physician or go to the nearest emergency department.  Please call the clinic during office hours if you have any questions or concerns.   You may also contact the Patient Navigator at [phone] should you have any questions or need assistance in obtaining follow up care.      Resources For Cancer Patients and their Caregivers ? American Cancer Society: Can assist with transportation, wigs, general needs, runs Look Good Feel Better.        825-570-4377 ? Cancer Care: Provides financial assistance, online support groups, medication/co-pay assistance.  1-800-813-HOPE 747-151-7312) ? [place] Assists [place]  cancer patients and their families through emotional , educational and financial support.  (947)147-7873 ? [place] DSS Where to apply for food stamps, Medicaid and utility assistance. 760-518-3752 ? [facility]: Transportation to medical appointments. 938-216-3010 ? Social Security Administration: May apply for disability if have a Stage IV cancer. 216 055 6752 (205)772-1118 ? [employer], Disability and Transit Services: Assists with nutrition, care and transit needs. 4750444839

## 2020-02-29 NOTE — Progress Notes (Signed)
Scott [name] presents today for D1C6 Taxol. Pt denies any new changes or symptoms since last treatment. Lab results and vitals have been reviewed and are stable and within parameters for treatment. Patient has been assessed by Dr. Chryl Heck who has approved proceeding with treatment today as planned.  Infusions tolerated without incident or complaint. VSS upon completion of treatment. IV flushed and removed per protocol, see MAR and IV flowsheet for details. Peripheral IV noted for positive blood return by 2 RNs prior to, during, and post infusion. Discharged in satisfactory condition with follow up instructions.

## 2020-02-29 NOTE — Progress Notes (Addendum)
Patient was assessed by Dr. Chryl Heck and labs have been reviewed.  Patient is okay to proceed with treatment today. Primary RN and pharmacy aware.

## 2020-02-29 NOTE — Progress Notes (Signed)
Continue Taxotere 60mg /m2 per MD

## 2020-03-02 ENCOUNTER — Inpatient Hospital Stay (HOSPITAL_COMMUNITY): Payer: BC Managed Care – PPO

## 2020-03-02 ENCOUNTER — Other Ambulatory Visit: Payer: Self-pay

## 2020-03-02 ENCOUNTER — Ambulatory Visit (INDEPENDENT_AMBULATORY_CARE_PROVIDER_SITE_OTHER): Payer: BC Managed Care – PPO | Admitting: Internal Medicine

## 2020-03-02 ENCOUNTER — Encounter (INDEPENDENT_AMBULATORY_CARE_PROVIDER_SITE_OTHER): Payer: Self-pay | Admitting: Internal Medicine

## 2020-03-02 VITALS — BP 155/71 | HR 65 | Temp 97.2°F | Resp 18

## 2020-03-02 VITALS — BP 128/78 | HR 78 | Temp 97.2°F | Ht 66.0 in | Wt 182.0 lb

## 2020-03-02 DIAGNOSIS — R5381 Other malaise: Secondary | ICD-10-CM | POA: Diagnosis not present

## 2020-03-02 DIAGNOSIS — R5383 Other fatigue: Secondary | ICD-10-CM | POA: Diagnosis not present

## 2020-03-02 DIAGNOSIS — E782 Mixed hyperlipidemia: Secondary | ICD-10-CM | POA: Diagnosis not present

## 2020-03-02 DIAGNOSIS — R232 Flushing: Secondary | ICD-10-CM

## 2020-03-02 DIAGNOSIS — C7951 Secondary malignant neoplasm of bone: Secondary | ICD-10-CM | POA: Diagnosis not present

## 2020-03-02 DIAGNOSIS — C61 Malignant neoplasm of prostate: Secondary | ICD-10-CM

## 2020-03-02 MED ORDER — ESTRADIOL 2 MG PO TABS: 4.0000 mg | ORAL_TABLET | Freq: Every day | ORAL | 3 refills | Status: DC

## 2020-03-02 MED ORDER — NP THYROID 60 MG PO TABS: 60.0000 mg | ORAL_TABLET | Freq: Every day | ORAL | 3 refills | Status: DC

## 2020-03-02 MED ORDER — PEGFILGRASTIM-JMDB 6 MG/0.6ML ~~LOC~~ SOSY
6.0000 mg | PREFILLED_SYRINGE | Freq: Once | SUBCUTANEOUS | Status: AC
  Administered 2020-03-02: 10:00:00 6 mg via SUBCUTANEOUS
  Filled 2020-03-02: qty 0.6

## 2020-03-02 NOTE — Progress Notes (Signed)
Patient tolerated Fulphila injection with no complaints voiced.  Site clean and dry with no bruising or swelling noted.  No complaints of pain.  Discharged with vital signs stable and no signs or symptoms of distress noted.  

## 2020-03-02 NOTE — Progress Notes (Signed)
Metrics: Intervention Frequency ACO  Documented Smoking Status Yearly  Screened one or more times in 24 months  Cessation Counseling or  Active cessation medication Past 24 months  Past 24 months   Guideline developer: UpToDate (See UpToDate for funding source) Date Released: 2014       Wellness Office Visit  Subjective:  Patient ID: Scott [name], male    DOB: Feb 10, 1956  Age: 64 y.o. MRN: [id number]  CC: This man comes for follow-up regarding his hot flashes from androgen deprivation therapy for his metastatic prostate cancer, malaise and fatigue, dyslipidemia. HPI He has been able to tolerate estradiol 2 mg orally daily but he still continues to get hot flashes and they have not completely resolved.  Thankfully, his PSA is now decreasing and he is apparently finished his last chemotherapy. He has tolerated his very small dose of NP thyroid 30 mg daily without any problems.  Past Medical History:  Diagnosis Date  . Bladder obstruction   . Dyspnea   . Essential hypertension, benign 01/26/2019   at [place] office elevated, normal at home, no medications at this time  . History of basal cell cancer   . HLD (hyperlipidemia) 01/26/2019  . Hypothyroidism    history of, not currently taking medication  . Prostate cancer ([place])   . Vitamin D deficiency disease 01/26/2019   Past Surgical History:  Procedure Laterality Date  . CYSTOSCOPY W/ URETERAL STENT PLACEMENT Left 11/10/2019   Procedure: CYSTOSCOPY;  Surgeon: Irine Seal, MD;  [facility];  Service: Urology;  Laterality: Left;  . HERNIA REPAIR  3825   UMBILICAL   . PROSTATE SURGERY    . SKIN CANCER EXCISION    . TONSILLECTOMY Bilateral   . TRANSURETHRAL RESECTION OF PROSTATE N/A 01/25/2017   Procedure: TRANSURETHRAL RESECTION OF THE PROSTATE (TURP);  Surgeon: Alexis Frock, MD;  [facility];  Service: Urology;  Laterality: N/A;  . TRANSURETHRAL RESECTION OF PROSTATE N/A 11/10/2019   Procedure: TRANSURETHRAL RESECTION OF  THE PROSTATE (TURP);  Surgeon: Irine Seal, MD;  [facility];  Service: Urology;  Laterality: N/A;  . WISDOM TOOTH EXTRACTION       Family History  Problem Relation Age of Onset  . Emphysema Mother   . Alzheimer's disease Father   . Prostate cancer Father        dx in his 29s  . Prostate cancer Brother        dx in his 21s  . Breast cancer Maternal Grandmother   . Pancreatic cancer Maternal Grandfather   . Dementia Paternal Grandmother   . Heart attack Paternal Grandfather     Social History   Social History Narrative   Married for 19 years.Works for [employer] from home.   Social History   Tobacco Use  . Smoking status: Former Smoker    Packs/day: 1.50    Years: 35.00    Pack years: 52.50    Types: Cigarettes    Quit date: 01/12/2004    Years since quitting: 16.1  . Smokeless tobacco: Never Used  . Tobacco comment: OVER 30 YEARS HX OF SMOKING   Substance Use Topics  . Alcohol use: No    Current Meds  Medication Sig  . acetaminophen (TYLENOL) 500 MG tablet Take 1,000 mg by mouth at bedtime.  . ASTRAGALUS PO Take 1 tablet by mouth 2 (two) times daily.  . Cholecalciferol 125 MCG (5000 UT) capsule Take 5,000-10,000 Units by mouth See admin instructions. Take 10000 units in the morning  and 5000 units at night  . cimetidine (TAGAMET) 200 MG tablet Take 200-400 mg by mouth daily as needed (inflammation).   . DANDELION ROOT PO Take 500 mg by mouth daily.  Marland Kitchen dutasteride (AVODART) 0.5 MG capsule Take 1 capsule (0.5 mg total) by mouth daily.  Marland Kitchen ibuprofen (ADVIL) 200 MG tablet Take 400 mg by mouth every 6 (six) hours as needed for moderate pain.  Marland Kitchen Leuprolide Acetate, 6 Month, (LUPRON DEPOT, 30-MONTH,) 45 MG injection Inject 45 mg into the muscle every 6 (six) months.  Marland Kitchen MAGNESIUM PO Take 1 tablet by mouth at bedtime.  Marland Kitchen MELATONIN PO Take 40 mg by mouth at bedtime.  Marland Kitchen MILK THISTLE PO Take 1 capsule by mouth daily.  . Misc Natural Products (TURMERIC CURCUMIN) CAPS  Take 800 capsules by mouth 2 (two) times daily.  . mupirocin ointment (BACTROBAN) 2 % Apply 1 application topically 2 (two) times daily.  Marland Kitchen NATTOKINASE PO Take 2 capsules by mouth at bedtime.  . ondansetron (ZOFRAN ODT) 8 MG disintegrating tablet Take 1 tablet (8 mg total) by mouth every 8 (eight) hours as needed for nausea or vomiting.  Marland Kitchen OVER THE COUNTER MEDICATION Take 1 capsule by mouth 2 (two) times daily. [place] [employer] Mushroom  Anti-Cancer /Immune Scientist, product/process development  . OVER THE COUNTER MEDICATION Place 1 Dose under the tongue 2 (two) times daily. CBD oil daily  . OVER THE COUNTER MEDICATION Take 1 capsule by mouth daily. Gamma E otc supplement  . pegfilgrastim-jmdb (FULPHILA) 6 MG/0.6ML injection Inject 6 mg into the skin every 21 ( twenty-one) days. On Day 3 with each cycle of chemotherapy  . predniSONE (DELTASONE) 5 MG tablet Take 1 tablet (5 mg total) by mouth 2 (two) times daily with a meal. Take 1 tablet in the morning and 1 tablet at bedtime  . QUERCETIN PO Take 1 tablet by mouth 2 (two) times daily.  . vitamin B-12 (CYANOCOBALAMIN) 1000 MCG tablet Take 1,000 mcg by mouth daily.  Marland Kitchen VITAMIN K PO Take 1 capsule by mouth daily.  . Zinc 30 MG CAPS Take 30 mg by mouth daily.  . [DISCONTINUED] estradiol (ESTRACE) 2 MG tablet Take 1 tablet (2 mg total) by mouth daily.  . [DISCONTINUED] NP THYROID 30 MG tablet Take 1 tablet (30 mg total) by mouth daily before breakfast.      Depression screen [facility] 2/9 03/02/2020 07/27/2019  Decreased Interest 0 0  Down, Depressed, Hopeless 0 0  PHQ - 2 Score 0 0  Altered sleeping 0 -  Tired, decreased energy 0 -  Change in appetite 0 -  Feeling bad or failure about yourself  0 -  Trouble concentrating 0 -  Moving slowly or fidgety/restless 0 -  Suicidal thoughts 0 -  PHQ-9 Score 0 -  Difficult doing work/chores Not difficult at all -     Objective:   Today's Vitals: BP 128/78   Pulse 78   Temp (!) 97.2 F (36.2 C) (Temporal)   Ht 5\' 6"  (1.676 m)   Wt  182 lb (82.6 kg)   SpO2 98%   BMI 29.38 kg/m  Vitals with BMI 03/02/2020 02/29/2020 02/29/2020  Height 5\' 6"  - -  Weight 182 lbs - -  BMI 27.74 - -  Systolic 128 786 767  Diastolic 78 77 85  Pulse 78 62 67     Physical Exam  He looks systemically well.  No new physical findings, remains overweight.     Assessment   1. Malaise and  fatigue   2. Mixed hyperlipidemia   3. Hot flashes       Tests ordered No orders of the defined types were placed in this encounter.    Plan: 1. Since he is continue to have hot flashes we going to double the dose of estradiol so he will be taking estradiol 4 mg daily. 2. We can also optimize his thyroid so I have sent a new prescription of NP thyroid 60 mg daily.  If he has any problems with this, he will let me know. 3. Follow-up in about 4 to 6 weeks time and we may do blood work then.   Meds ordered this encounter  Medications  . estradiol (ESTRACE) 2 MG tablet    Sig: Take 2 tablets (4 mg total) by mouth daily.    Dispense:  60 tablet    Refill:  3  . NP THYROID 60 MG tablet    Sig: Take 1 tablet (60 mg total) by mouth daily before breakfast.    Dispense:  30 tablet    Refill:  3    Gedeon Brandow Luther Parody, MD

## 2020-03-22 ENCOUNTER — Inpatient Hospital Stay (HOSPITAL_BASED_OUTPATIENT_CLINIC_OR_DEPARTMENT_OTHER): Payer: BC Managed Care – PPO | Admitting: Hematology

## 2020-03-22 ENCOUNTER — Other Ambulatory Visit: Payer: Self-pay

## 2020-03-22 ENCOUNTER — Inpatient Hospital Stay (HOSPITAL_COMMUNITY): Payer: BC Managed Care – PPO | Attending: Hematology

## 2020-03-22 VITALS — BP 140/67 | HR 70 | Temp 96.8°F | Resp 18 | Wt 182.6 lb

## 2020-03-22 DIAGNOSIS — C61 Malignant neoplasm of prostate: Secondary | ICD-10-CM | POA: Diagnosis not present

## 2020-03-22 DIAGNOSIS — C7951 Secondary malignant neoplasm of bone: Secondary | ICD-10-CM | POA: Insufficient documentation

## 2020-03-22 DIAGNOSIS — Z79899 Other long term (current) drug therapy: Secondary | ICD-10-CM | POA: Insufficient documentation

## 2020-03-22 DIAGNOSIS — E785 Hyperlipidemia, unspecified: Secondary | ICD-10-CM | POA: Insufficient documentation

## 2020-03-22 DIAGNOSIS — R232 Flushing: Secondary | ICD-10-CM | POA: Insufficient documentation

## 2020-03-22 DIAGNOSIS — Z800 Family history of malignant neoplasm of digestive organs: Secondary | ICD-10-CM | POA: Diagnosis not present

## 2020-03-22 DIAGNOSIS — E039 Hypothyroidism, unspecified: Secondary | ICD-10-CM | POA: Diagnosis not present

## 2020-03-22 DIAGNOSIS — E559 Vitamin D deficiency, unspecified: Secondary | ICD-10-CM | POA: Insufficient documentation

## 2020-03-22 DIAGNOSIS — Z803 Family history of malignant neoplasm of breast: Secondary | ICD-10-CM | POA: Insufficient documentation

## 2020-03-22 DIAGNOSIS — Z87891 Personal history of nicotine dependence: Secondary | ICD-10-CM | POA: Diagnosis not present

## 2020-03-22 DIAGNOSIS — Z85828 Personal history of other malignant neoplasm of skin: Secondary | ICD-10-CM | POA: Diagnosis not present

## 2020-03-22 DIAGNOSIS — Z7952 Long term (current) use of systemic steroids: Secondary | ICD-10-CM | POA: Diagnosis not present

## 2020-03-22 DIAGNOSIS — I10 Essential (primary) hypertension: Secondary | ICD-10-CM | POA: Diagnosis not present

## 2020-03-22 DIAGNOSIS — N1330 Unspecified hydronephrosis: Secondary | ICD-10-CM | POA: Diagnosis not present

## 2020-03-22 DIAGNOSIS — G629 Polyneuropathy, unspecified: Secondary | ICD-10-CM | POA: Diagnosis not present

## 2020-03-22 LAB — CBC WITH DIFFERENTIAL/PLATELET
Abs Immature Granulocytes: 0.04 10*3/uL (ref 0.00–0.07)
Basophils Absolute: 0.1 10*3/uL (ref 0.0–0.1)
Basophils Relative: 2 %
Eosinophils Absolute: 0.1 10*3/uL (ref 0.0–0.5)
Eosinophils Relative: 1 %
HCT: 33.7 % — ABNORMAL LOW (ref 39.0–52.0)
Hemoglobin: 11.5 g/dL — ABNORMAL LOW (ref 13.0–17.0)
Immature Granulocytes: 1 %
Lymphocytes Relative: 12 %
Lymphs Abs: 1.1 10*3/uL (ref 0.7–4.0)
MCH: 33 pg (ref 26.0–34.0)
MCHC: 34.1 g/dL (ref 30.0–36.0)
MCV: 96.8 fL (ref 80.0–100.0)
Monocytes Absolute: 1 10*3/uL (ref 0.1–1.0)
Monocytes Relative: 12 %
Neutro Abs: 6.5 10*3/uL (ref 1.7–7.7)
Neutrophils Relative %: 72 %
Platelets: 265 10*3/uL (ref 150–400)
RBC: 3.48 MIL/uL — ABNORMAL LOW (ref 4.22–5.81)
RDW: 13.2 % (ref 11.5–15.5)
WBC: 8.8 10*3/uL (ref 4.0–10.5)
nRBC: 0 % (ref 0.0–0.2)

## 2020-03-22 LAB — COMPREHENSIVE METABOLIC PANEL
ALT: 11 U/L (ref 0–44)
AST: 11 U/L — ABNORMAL LOW (ref 15–41)
Albumin: 3.5 g/dL (ref 3.5–5.0)
Alkaline Phosphatase: 44 U/L (ref 38–126)
Anion gap: 9 (ref 5–15)
BUN: 15 mg/dL (ref 8–23)
CO2: 24 mmol/L (ref 22–32)
Calcium: 9.2 mg/dL (ref 8.9–10.3)
Chloride: 100 mmol/L (ref 98–111)
Creatinine, Ser: 0.87 mg/dL (ref 0.61–1.24)
GFR, Estimated: >60 mL/min (ref >60)
Glucose, Bld: 103 mg/dL — ABNORMAL HIGH (ref 70–99)
Potassium: 4.2 mmol/L (ref 3.5–5.1)
Sodium: 133 mmol/L — ABNORMAL LOW (ref 135–145)
Total Bilirubin: 0.9 mg/dL (ref 0.3–1.2)
Total Protein: 6.4 g/dL — ABNORMAL LOW (ref 6.5–8.1)

## 2020-03-22 LAB — PSA: Prostatic Specific Antigen: 2.17 ng/mL (ref 0.00–4.00)

## 2020-03-22 NOTE — Patient Instructions (Signed)
[facility] at [facility] Discharge Instructions  You were seen today by Dr. Ellin Saba. He went over your recent results. You will be referred to genetic testing for further analysis. Start taking either 1 tablet of prednisone every other day or cut the tablets in half to take daily for 2 weeks, then stop. Dr. Ellin Saba will see you back in 6 weeks for labs and follow up.   Thank you for choosing [facility] at [facility] to provide your oncology and hematology care.  To afford each patient quality time with our provider, please arrive at least 15 minutes before your scheduled appointment time.   If you have a lab appointment with the Cancer Center please come in thru the Main Entrance and check in at the main information desk  You need to re-schedule your appointment should you arrive 10 or more minutes late.  We strive to give you quality time with our providers, and arriving late affects you and other patients whose appointments are after yours.  Also, if you no show three or more times for appointments you may be dismissed from the clinic at the providers discretion.     Again, thank you for choosing [facility].  Our hope is that these requests will decrease the amount of time that you wait before being seen by our physicians.       _____________________________________________________________  Should you have questions after your visit to [facility], please contact our office at [phone] between the hours of 8:00 a.m. and 4:30 p.m.  Voicemails left after 4:00 p.m. will not be returned until the following business day.  For prescription refill requests, have your pharmacy contact our office and allow 72 hours.    Cancer Center Support Programs:   > Cancer Support Group  2nd Tuesday of the month 1pm-2pm, Journey Room

## 2020-03-22 NOTE — Progress Notes (Signed)
[facility] [address], [place] [address]   CLINIC:  Medical Oncology/Hematology  PCP:  Wilson Singer, MD [address] [employer] [facility] [place] [address] [phone]   REASON FOR VISIT:  Follow-up for metastatic prostate cancer  PRIOR THERAPY:  1. TURP on 11/10/2019. 2. Docetaxel x 6 cycles from 11/16/2019 to 02/29/2020.  NGS Results: Not done  CURRENT THERAPY: Observation  BRIEF ONCOLOGIC HISTORY:  Oncology History  Prostate cancer metastatic to multiple sites [facility])  01/25/2017 Initial Diagnosis   Prostate cancer metastatic to multiple sites [facility])   11/16/2019 -  Chemotherapy   The patient had ondansetron (ZOFRAN) injection 8 mg, 8 mg (100 % of original dose 8 mg), Intravenous,  Once, 6 of 6 cycles Dose modification: 8 mg (original dose 8 mg, Cycle 1) pegfilgrastim-jmdb (FULPHILA) injection 6 mg, 6 mg, Subcutaneous,  Once, 6 of 6 cycles Administration: 6 mg (11/18/2019), 6 mg (12/09/2019), 6 mg (12/30/2019), 6 mg (01/20/2020), 6 mg (02/10/2020) ondansetron (ZOFRAN) 8 mg in sodium chloride 0.9 % 50 mL IVPB, , Intravenous,  Once, 4 of 4 cycles Administration: 8 mg (12/28/2019), 8 mg (01/18/2020), 8 mg (02/29/2020) DOCEtaxel (TAXOTERE) 140 mg in sodium chloride 0.9 % 250 mL chemo infusion, 75 mg/m2 = 140 mg, Intravenous,  Once, 6 of 6 cycles Administration: 140 mg (11/16/2019), 140 mg (12/07/2019), 140 mg (12/28/2019), 140 mg (01/18/2020), 110 mg (02/08/2020), 110 mg (02/29/2020)  for chemotherapy treatment.      CANCER STAGING: Cancer Staging No matching staging information was found for the patient.  INTERVAL HISTORY:  Scott [name], a 65 y.o. male, returns for routine follow-up of his metastatic prostate cancer. Scott [name] was last seen by Dr. Rachel Moulds on 02/29/2020.   Today he is accompanied by his wife and he reports feeling well. He tolerated the last chemo well. He continues taking folic acid and he continues having constant numbness in his toes  and resolved in his fingers; his fingers are still mildly sore. He also reports having bone pains in his anterior shins, similar to what he feels after getting his Fulphila injection. He is taking estradiol for his hot flashes which was started by Dr. Karilyn Cota. His appetite is good and he denies having N/V/D. He continues taking prednisone 5 mg daily. He reports that he occasionally urinates small chunks of tissue along with occasional dysuria, but nothing constant and no suprapubic pain.   He had not done genetic testing yet, but he is now open to getting it done.  REVIEW OF SYSTEMS:  Review of Systems  Constitutional: Positive for appetite change (75%) and fatigue (75%).  HENT:   Positive for nosebleeds.   Respiratory: Positive for shortness of breath.   Gastrointestinal: Negative for abdominal pain, diarrhea, nausea and vomiting.  Endocrine: Positive for hot flashes.  Genitourinary: Positive for dysuria (occasionally with urinating out chunks of tissue).   Neurological: Positive for dizziness (occasional) and numbness (constantly in toes; resolved in fingers).    PAST MEDICAL/SURGICAL HISTORY:  Past Medical History:  Diagnosis Date  . Bladder obstruction   . Dyspnea   . Essential hypertension, benign 01/26/2019   at doctor's office elevated, normal at home, no medications at this time  . History of basal cell cancer   . HLD (hyperlipidemia) 01/26/2019  . Hypothyroidism    history of, not currently taking medication  . Prostate cancer (HCC)   . Vitamin D deficiency disease 01/26/2019   Past Surgical History:  Procedure Laterality Date  .  CYSTOSCOPY W/ URETERAL STENT PLACEMENT Left 11/10/2019   Procedure: CYSTOSCOPY;  Surgeon: Irine Seal, MD;  Location: [facility];  Service: Urology;  Laterality: Left;  . HERNIA REPAIR  [id number]   UMBILICAL   . PROSTATE SURGERY    . SKIN CANCER EXCISION    . TONSILLECTOMY Bilateral   . TRANSURETHRAL RESECTION OF PROSTATE N/A 01/25/2017   Procedure:  TRANSURETHRAL RESECTION OF THE PROSTATE (TURP);  Surgeon: Alexis Frock, MD;  Location: [facility];  Service: Urology;  Laterality: N/A;  . TRANSURETHRAL RESECTION OF PROSTATE N/A 11/10/2019   Procedure: TRANSURETHRAL RESECTION OF THE PROSTATE (TURP);  Surgeon: Irine Seal, MD;  Location: [facility];  Service: Urology;  Laterality: N/A;  . WISDOM TOOTH EXTRACTION      SOCIAL HISTORY:  Social History   Socioeconomic History  . Marital status: Married    Spouse name: Not on file  . Number of children: 1  . Years of education: Not on file  . Highest education level: Not on file  Occupational History    Comment: working full time  Tobacco Use  . Smoking status: Former Smoker    Packs/day: 1.50    Years: 35.00    Pack years: 52.50    Types: Cigarettes    Quit date: 01/12/2004    Years since quitting: 16.2  . Smokeless tobacco: Never Used  . Tobacco comment: OVER 30 YEARS HX OF SMOKING   Vaping Use  . Vaping Use: Never used  Substance and Sexual Activity  . Alcohol use: No  . Drug use: Not Currently  . Sexual activity: Not Currently  Other Topics Concern  . Not on file  Social History Narrative   Married for 19 years.Works for [employer] from home.   Social Determinants of Health   Financial Resource Strain: Low Risk   . Difficulty of Paying Living Expenses: Not hard at all  Food Insecurity: No Food Insecurity  . Worried About Charity fundraiser in the Last Year: Never true  . Ran Out of Food in the Last Year: Never true  Transportation Needs: No Transportation Needs  . Lack of Transportation (Medical): No  . Lack of Transportation (Non-Medical): No  Physical Activity: Inactive  . Days of Exercise per Week: 0 days  . Minutes of Exercise per Session: 0 min  Stress: No Stress Concern Present  . Feeling of Stress : Only a little  Social Connections: Moderately Integrated  . Frequency of Communication with Friends and Family: Three times a week  . Frequency of Social  Gatherings with Friends and Family: Never  . Attends Religious Services: Never  . Active Member of Clubs or Organizations: Yes  . Attends Archivist Meetings: 1 to 4 times per year  . Marital Status: Married  Human resources officer Violence: Not At Risk  . Fear of Current or Ex-Partner: No  . Emotionally Abused: No  . Physically Abused: No  . Sexually Abused: No    FAMILY HISTORY:  Family History  Problem Relation Age of Onset  . Emphysema Mother   . Alzheimer's disease Father   . Prostate cancer Father        dx in his 51s  . Prostate cancer Brother        dx in his 72s  . Breast cancer Maternal Grandmother   . Pancreatic cancer Maternal Grandfather   . Dementia Paternal Grandmother   . Heart attack Paternal Grandfather     CURRENT MEDICATIONS:  Current Outpatient Medications  Medication  Sig Dispense Refill  . ASTRAGALUS PO Take 1 tablet by mouth 2 (two) times daily.    . Cholecalciferol 125 MCG (5000 UT) capsule Take 5,000-10,000 Units by mouth See admin instructions. Take 10000 units in the morning and 5000 units at night    . cimetidine (TAGAMET) 200 MG tablet Take 200-400 mg by mouth daily as needed (inflammation).     . DANDELION ROOT PO Take 500 mg by mouth daily.    Marland Kitchen estradiol (ESTRACE) 2 MG tablet Take 2 tablets (4 mg total) by mouth daily. 60 tablet 3  . ibuprofen (ADVIL) 200 MG tablet Take 400 mg by mouth every 6 (six) hours as needed for moderate pain.    Marland Kitchen Leuprolide Acetate, 6 Month, (LUPRON DEPOT, 43-MONTH,) 45 MG injection Inject 45 mg into the muscle every 6 (six) months.    Marland Kitchen MAGNESIUM PO Take 1 tablet by mouth at bedtime.    Marland Kitchen MELATONIN PO Take 60 mg by mouth at bedtime.    Marland Kitchen MILK THISTLE PO Take 1 capsule by mouth daily.    . Misc Natural Products (TURMERIC CURCUMIN) CAPS Take 800 capsules by mouth 2 (two) times daily.    . mupirocin ointment (BACTROBAN) 2 % Apply 1 application topically 2 (two) times daily. 22 g 0  . NATTOKINASE PO Take 2 capsules by  mouth at bedtime.    . NP THYROID 60 MG tablet Take 1 tablet (60 mg total) by mouth daily before breakfast. 30 tablet 3  . ondansetron (ZOFRAN ODT) 8 MG disintegrating tablet Take 1 tablet (8 mg total) by mouth every 8 (eight) hours as needed for nausea or vomiting. 20 tablet 3  . OVER THE COUNTER MEDICATION Take 1 capsule by mouth 2 (two) times daily. [place] [employer] Mushroom  Anti-Cancer /Immune Scientist, product/process development    . OVER THE COUNTER MEDICATION Place 1 Dose under the tongue 2 (two) times daily. CBD oil daily    . OVER THE COUNTER MEDICATION Take 1 capsule by mouth daily. Gamma E otc supplement    . OVER THE COUNTER MEDICATION Take 1 capsule by mouth 2 (two) times daily. zyflamend otc supplement    . pegfilgrastim-jmdb (FULPHILA) 6 MG/0.6ML injection Inject 6 mg into the skin every 21 ( twenty-one) days. On Day 3 with each cycle of chemotherapy    . predniSONE (DELTASONE) 5 MG tablet Take 1 tablet (5 mg total) by mouth 2 (two) times daily with a meal. Take 1 tablet in the morning and 1 tablet at bedtime 60 tablet 3  . QUERCETIN PO Take 1 tablet by mouth 2 (two) times daily.    . vitamin B-12 (CYANOCOBALAMIN) 1000 MCG tablet Take 1,000 mcg by mouth daily.    Marland Kitchen VITAMIN K PO Take 1 capsule by mouth daily.    . Zinc 30 MG CAPS Take 30 mg by mouth daily.    Marland Kitchen acetaminophen (TYLENOL) 500 MG tablet Take 1,000 mg by mouth at bedtime. (Patient not taking: Reported on 03/22/2020)     No current facility-administered medications for this visit.   Facility-Administered Medications Ordered in Other Visits  Medication Dose Route Frequency Provider Last Rate Last Admin  . heparin lock flush 100 unit/mL  500 Units Intracatheter Once PRN [name], MD      . sodium chloride flush (NS) 0.9 % injection 10 mL  10 mL Intracatheter PRN [name], MD        ALLERGIES:  Allergies  Allergen Reactions  . Wellbutrin [Bupropion] Hives  PHYSICAL EXAM:  Performance status (ECOG): 0 -  Asymptomatic  Vitals:   03/22/20 1202  BP: 140/67  Pulse: 70  Resp: 18  Temp: (!) 96.8 F (36 C)  SpO2: 99%   Wt Readings from Last 3 Encounters:  03/22/20 182 lb 9.6 oz (82.8 kg)  03/02/20 182 lb (82.6 kg)  02/08/20 177 lb 4 oz (80.4 kg)   Physical Exam Vitals reviewed.  Constitutional:      Appearance: Normal appearance.  Cardiovascular:     Rate and Rhythm: Normal rate and regular rhythm.     Pulses: Normal pulses.     Heart sounds: Normal heart sounds.  Pulmonary:     Effort: Pulmonary effort is normal.     Breath sounds: Normal breath sounds.  Neurological:     General: No focal deficit present.     Mental Status: He is alert and oriented to person, place, and time.  Psychiatric:        Mood and Affect: Mood normal.        Behavior: Behavior normal.      LABORATORY DATA:  I have reviewed the labs as listed.  CBC Latest Ref Rng & Units 03/22/2020 02/29/2020 02/08/2020  WBC 4.0 - 10.5 K/uL 8.8 4.8 5.5  Hemoglobin 13.0 - 17.0 g/dL 11.5(L) 11.9(L) 11.7(L)  Hematocrit 39.0 - 52.0 % 33.7(L) 35.8(L) 34.2(L)  Platelets 150 - 400 K/uL 265 315 314   CMP Latest Ref Rng & Units 03/22/2020 02/29/2020 02/08/2020  Glucose 70 - 99 mg/dL 103(H) 83 116(H)  BUN 8 - 23 mg/dL 15 15 13   Creatinine 0.61 - 1.24 mg/dL 0.87 0.96 0.90  Sodium 135 - 145 mmol/L 133(L) 132(L) 131(L)  Potassium 3.5 - 5.1 mmol/L 4.2 4.0 3.7  Chloride 98 - 111 mmol/L 100 101 100  CO2 22 - 32 mmol/L 24 23 23   Calcium 8.9 - 10.3 mg/dL 9.2 9.2 9.3  Total Protein 6.5 - 8.1 g/dL 6.4(L) 6.4(L) 6.6  Total Bilirubin 0.3 - 1.2 mg/dL 0.9 1.1 0.8  Alkaline Phos 38 - 126 U/L 44 53 53  AST 15 - 41 U/L 11(L) 14(L) 15  ALT 0 - 44 U/L 11 12 14    PSA 3.11 02/29/2020  PSA 4.43 (H) 02/08/2020  PSA 4.20 (H) 01/18/2020    DIAGNOSTIC IMAGING:  I have independently reviewed the scans and discussed with the patient. No results found.   ASSESSMENT:  1. 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 Prominent left hydronephrosis and hydroureter noted. New sclerotic lesion in the left anterior vertebral body of L1. Mild left periaortic adenopathy increased from prior, short axis lymph node measuring 1 cm. -Last PSA increased to 15.8 on 09/28/2019. -As there is rapid progression of disease, I have recommended docetaxel chemotherapy. He is agreeable after long discussion. -Cystoscopy with transurethral resection of tumor in the posterior bladder neck, right and left walls of the bladder, right lobe of the prostate on 11/10/2019. -Plan was to do 6 cycles of docetaxel followed by abiraterone and prednisone. -6 cycles of docetaxel from 11/16/2019 through 02/29/2020. -CTAP on 01/06/2020 showed persistent thickening of the urinary bladder wall. Left hydronephrosis is significantly reduced compared to prior exam. Left-sided retroperitoneal and left iliac lymph nodes reduced in size. Interval increase in sclerosis of lesions of the left aspect of L2 vertebral body consistent with treatment response. No new  bone lesions. -Bone scan on 01/06/2020 shows stable L2 metastatic focus.  2. Left hydroureteronephrosis: -CT scan showed prostate mass invading the posterior bladder and obstructing the ureter. Creatinine increased to 1.1. -Renal ultrasound on 12/11/2019 showed interval resolution of previously seen left-sided hydronephrosis. Previously seen soft tissue mass at the posterior bladder lumen no longer seen.   PLAN:  1. 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 -His PSA improved to 2.17.  Last bone scan after 3 cycles did not show any major changes with stable L2 focus.  CTAP also showed improved left retroperitoneal and iliac lymph nodes and improved left hydronephrosis.  We will plan to repeat scans if rising  PSA. -He does not want to continue Lupron injections. -We will do close monitoring at this time.  RTC 6 weeks with repeat PSA and other labs.  2. Left hydroureteronephrosis: -Most recent CT scan showed improvement in hydronephrosis.  Creatinine improved to 0.87.  3. Hot flashes: -Continue estradiol 2 mg tablet daily, prescribed by Dr. Anastasio Champion.  4. Peripheral neuropathy: -He has occasional shooting pains in the lower legs.  He has numbness in his toes and some soreness in the fingertips.  We will closely monitor.  If there is any worsening will consider Cymbalta.   Orders placed this encounter:  Orders Placed This Encounter  Procedures  . Ambulatory referral to [facility], [facility] [phone]   I, Milinda Antis, am acting as a scribe for Dr. Sanda Linger.  I, [name] MD, have reviewed the above documentation for accuracy and completeness, and I agree with the above.

## 2020-03-28 ENCOUNTER — Ambulatory Visit (INDEPENDENT_AMBULATORY_CARE_PROVIDER_SITE_OTHER): Payer: BC Managed Care – PPO | Admitting: Internal Medicine

## 2020-03-31 ENCOUNTER — Encounter (HOSPITAL_COMMUNITY): Payer: Self-pay | Admitting: Licensed Clinical Social Worker

## 2020-03-31 ENCOUNTER — Ambulatory Visit (HOSPITAL_BASED_OUTPATIENT_CLINIC_OR_DEPARTMENT_OTHER): Payer: BC Managed Care – PPO | Admitting: Licensed Clinical Social Worker

## 2020-03-31 DIAGNOSIS — Z8042 Family history of malignant neoplasm of prostate: Secondary | ICD-10-CM | POA: Insufficient documentation

## 2020-03-31 DIAGNOSIS — Z800 Family history of malignant neoplasm of digestive organs: Secondary | ICD-10-CM | POA: Insufficient documentation

## 2020-03-31 DIAGNOSIS — Z803 Family history of malignant neoplasm of breast: Secondary | ICD-10-CM | POA: Insufficient documentation

## 2020-03-31 DIAGNOSIS — C61 Malignant neoplasm of prostate: Secondary | ICD-10-CM

## 2020-03-31 NOTE — Progress Notes (Signed)
REFERRING PROVIDER: [name], MD [address] [place],  [place] [address]  PRIMARY PROVIDER:  Doree Albee, MD  PRIMARY REASON FOR VISIT:  1. Prostate cancer metastatic to multiple sites [facility])   2. Family history of prostate cancer   3. Family history of breast cancer   4. Family history of pancreatic cancer     I connected with Scott [name] and his wife, Scott [name], on 03/31/2020 at 8:50 AM EDT by MyChart video conference and verified that I am speaking with the correct person using two identifiers.    Patient location: home Provider location: [place]:   Scott [name], a 65 y.o. male, was seen for a [place] cancer genetics consultation at the request of Dr. Delton Coombes due to a personal and family history of cancer.  Scott [name] presents to clinic today to discuss the possibility of a hereditary predisposition to cancer, genetic testing, and to further clarify his future cancer risks, as well as potential cancer risks for family members.   In 2018, at the age of 20, Scott [name] was diagnosed with metastatic prostate cancer. This has been treated with Lupron injections and chemotherapy.  CANCER HISTORY:  Oncology History  Prostate cancer metastatic to multiple sites [facility])  01/25/2017 Initial Diagnosis   Prostate cancer metastatic to multiple sites [facility])   11/16/2019 -  Chemotherapy   The patient had ondansetron (ZOFRAN) injection 8 mg, 8 mg (100 % of original dose 8 mg), Intravenous,  Once, 6 of 6 cycles Dose modification: 8 mg (original dose 8 mg, Cycle 1) pegfilgrastim-jmdb (FULPHILA) injection 6 mg, 6 mg, Subcutaneous,  Once, 6 of 6 cycles Administration: 6 mg (11/18/2019), 6 mg (12/09/2019), 6 mg (12/30/2019), 6 mg (01/20/2020), 6 mg (02/10/2020) ondansetron (ZOFRAN) 8 mg in sodium chloride 0.9 % 50 mL IVPB, , Intravenous,  Once, 4 of 4 cycles Administration: 8 mg (12/28/2019), 8 mg (01/18/2020), 8 mg (02/29/2020) DOCEtaxel (TAXOTERE) 140 mg in  sodium chloride 0.9 % 250 mL chemo infusion, 75 mg/m2 = 140 mg, Intravenous,  Once, 6 of 6 cycles Administration: 140 mg (11/16/2019), 140 mg (12/07/2019), 140 mg (12/28/2019), 140 mg (01/18/2020), 110 mg (02/08/2020), 110 mg (02/29/2020)  for chemotherapy treatment.         Past Medical History:  Diagnosis Date  . Bladder obstruction   . Dyspnea   . Essential hypertension, benign 01/26/2019   at [place] office elevated, normal at home, no medications at this time  . Family history of breast cancer   . Family history of pancreatic cancer   . Family history of prostate cancer   . History of basal cell cancer   . HLD (hyperlipidemia) 01/26/2019  . Hypothyroidism    history of, not currently taking medication  . Prostate cancer ([place])   . Vitamin D deficiency disease 01/26/2019    Past Surgical History:  Procedure Laterality Date  . CYSTOSCOPY W/ URETERAL STENT PLACEMENT Left 11/10/2019   Procedure: CYSTOSCOPY;  Surgeon: Irine Seal, MD;  Location: [facility];  Service: Urology;  Laterality: Left;  . HERNIA REPAIR  3338   UMBILICAL   . PROSTATE SURGERY    . SKIN CANCER EXCISION    . TONSILLECTOMY Bilateral   . TRANSURETHRAL RESECTION OF PROSTATE N/A 01/25/2017   Procedure: TRANSURETHRAL RESECTION OF THE PROSTATE (TURP);  Surgeon: Alexis Frock, MD;  Location: [facility];  Service: Urology;  Laterality: N/A;  . TRANSURETHRAL RESECTION OF PROSTATE N/A 11/10/2019   Procedure: TRANSURETHRAL RESECTION  OF THE PROSTATE (TURP);  Surgeon: Irine Seal, MD;  Location: [facility];  Service: Urology;  Laterality: N/A;  . WISDOM TOOTH EXTRACTION      Social History   Socioeconomic History  . Marital status: Married    Spouse name: Not on file  . Number of children: 1  . Years of education: Not on file  . Highest education level: Not on file  Occupational History    Comment: working full time  Tobacco Use  . Smoking status: Former Smoker    Packs/day: 1.50    Years: 35.00    Pack years: 52.50     Types: Cigarettes    Quit date: 01/12/2004    Years since quitting: 16.2  . Smokeless tobacco: Never Used  . Tobacco comment: OVER 30 YEARS HX OF SMOKING   Vaping Use  . Vaping Use: Never used  Substance and Sexual Activity  . Alcohol use: No  . Drug use: Not Currently  . Sexual activity: Not Currently  Other Topics Concern  . Not on file  Social History Narrative   Married for 19 years.Works for [employer] from home.   Social Determinants of Health   Financial Resource Strain: Low Risk   . Difficulty of Paying Living Expenses: Not hard at all  Food Insecurity: No Food Insecurity  . Worried About Charity fundraiser in the Last Year: Never true  . Ran Out of Food in the Last Year: Never true  Transportation Needs: No Transportation Needs  . Lack of Transportation (Medical): No  . Lack of Transportation (Non-Medical): No  Physical Activity: Inactive  . Days of Exercise per Week: 0 days  . Minutes of Exercise per Session: 0 min  Stress: No Stress Concern Present  . Feeling of Stress : Only a little  Social Connections: Moderately Integrated  . Frequency of Communication with Friends and Family: Three times a week  . Frequency of Social Gatherings with Friends and Family: Never  . Attends Religious Services: Never  . Active Member of Clubs or Organizations: Yes  . Attends Archivist Meetings: 1 to 4 times per year  . Marital Status: Married     FAMILY HISTORY:  We obtained a detailed, 4-generation family history.  Significant diagnoses are listed below: Family History  Problem Relation Age of Onset  . Emphysema Mother   . Alzheimer's disease Father   . Prostate cancer Father        dx in his 52s  . Prostate cancer Brother        dx in his 27s  . Breast cancer Maternal Grandmother        56s  . Pancreatic cancer Maternal Grandfather        40s  . Dementia Paternal Grandmother   . Heart attack Paternal Grandfather    Scott [name] has 1 son. He has 1  sister and 1 brother. His brother had prostate cancer at 4.   Scott [name]'s mother died at 29, no cancer history. Patient had 1 maternal aunt, 1 maternal uncle, no cancers. No known cancers in maternal cousins; limited information on this side. Maternal grandmother died of breast cancer in her 71s, grandfather died of pancreatic cancer in his 45s.   Scott [name]'s father had prostate cancer at 81 and died at 38. He had 2 brothers and 1 sister, no cancers. No known cancers in paternal cousins. Paternal grandmother died of dementia, grandfather had heart issues. Grandmother's father died of prostate cancer.  Scott [name] is unaware of previous family history of genetic testing for hereditary cancer risks. Patient's ancestors are of [place] descent. There is no reported Ashkenazi Jewish ancestry. There is no known consanguinity.    GENETIC COUNSELING ASSESSMENT: Scott [name] is a 65 y.o. male with a personal and family history of prostate cancer which is somewhat suggestive of a hereditary cancer syndrome and predisposition to cancer. We, therefore, discussed and recommended the following at today's visit.   DISCUSSION: We discussed that approximately 5-10% of prostate cancer is hereditary  Most cases of hereditary prostate cancer are associated with BRCA1/BRCA2 genes, although there are other genes associated with hereditary prostate cancer as well, and genes associated with other cancers in the family such as pancreatic and breast . We discussed that testing is beneficial for several reasons including knowing if an individual is a candidate for certain targeted therapies, knowing about other cancer risks, identifying potential screening and risk-reduction options that may be appropriate, and to understand if other family members could be at risk for cancer and allow them to undergo genetic testing.   We reviewed the characteristics, features and inheritance patterns of hereditary cancer  syndromes. We also discussed genetic testing, including the appropriate family members to test, the process of testing, insurance coverage and turn-around-time for results. We discussed the implications of a negative, positive and/or variant of uncertain significant result. We recommended Scott [name] pursue genetic testing for the [facility] CancerNext-Expanded gene panel.   The CancerNext-Expanded + RNAinsight gene panel offered by [employer] and includes sequencing and rearrangement analysis for the following 77 genes: IP, ALK, APC*, ATM*, AXIN2, BAP1, BARD1, BLM, BMPR1A, BRCA1*, BRCA2*, BRIP1*, CDC73, CDH1*,CDK4, CDKN1B, CDKN2A, CHEK2*, CTNNA1, DICER1, FANCC, FH, FLCN, GALNT12, KIF1B, LZTR1, MAX, MEN1, MET, MLH1*, MSH2*, MSH3, MSH6*, MUTYH*, NBN, NF1*, NF2, NTHL1, PALB2*, PHOX2B, PMS2*, POT1, PRKAR1A, PTCH1, PTEN*, RAD51C*, RAD51D*,RB1, RECQL, RET, SDHA, SDHAF2, SDHB, SDHC, SDHD, SMAD4, SMARCA4, SMARCB1, SMARCE1, STK11, SUFU, TMEM127, TP53*,TSC1, TSC2, VHL and XRCC2 (sequencing and deletion/duplication); EGFR, EGLN1, HOXB13, KIT, MITF, PDGFRA, POLD1 and POLE (sequencing only); EPCAM and GREM1 (deletion/duplication only).   Based on Scott [name] personal and family history of cancer, he meets medical criteria for genetic testing. Despite that he meets criteria, he may still have an out of pocket cost. We discussed that if his out of pocket cost for testing is over $100, the laboratory will call and confirm whether he wants to proceed with testing.  If the out of pocket cost of testing is less than $100 he will be billed by the genetic testing laboratory.   PLAN: After considering the risks, benefits, and limitations, Scott [name] provided informed consent to pursue genetic testing and the blood sample was sent to [facility] for analysis of the CancerNext-Expanded panel. Results should be available within approximately 2-3 weeks' time, at which point they will be disclosed by telephone to Scott [name], as will any  additional recommendations warranted by these results. Scott [name]. [place] will receive a summary of his genetic counseling visit and a copy of his results once available. This information will also be available in Epic.   Scott [name]. [place] questions were answered to his satisfaction today. Our contact information was provided should additional questions or concerns arise. Thank you for the referral and allowing [place] to share in the care of your patient.   Faith Rogue, MS, [facility] Genetic Counselor [email or web address] Phone: [phone]  The patient was seen for a total of 30 minutes in face-to-face genetic counseling.  His wife Scott [name]  was also present. Dr. Grayland Ormond was available for discussion regarding this case.   _______________________________________________________________________ For Office Staff:  Number of people involved in session: 2 Was an Intern/ student involved with case: no

## 2020-04-08 ENCOUNTER — Ambulatory Visit: Payer: BC Managed Care – PPO | Admitting: Urology

## 2020-04-10 ENCOUNTER — Other Ambulatory Visit: Payer: Self-pay | Admitting: Urology

## 2020-04-18 NOTE — Telephone Encounter (Signed)
Pharmacy notified med no longer needed.

## 2020-04-18 NOTE — Telephone Encounter (Signed)
Pharmacy notified.

## 2020-04-20 ENCOUNTER — Encounter: Payer: Self-pay | Admitting: Licensed Clinical Social Worker

## 2020-04-20 ENCOUNTER — Telehealth: Payer: Self-pay | Admitting: Licensed Clinical Social Worker

## 2020-04-20 ENCOUNTER — Other Ambulatory Visit: Payer: Self-pay

## 2020-04-20 ENCOUNTER — Encounter (INDEPENDENT_AMBULATORY_CARE_PROVIDER_SITE_OTHER): Payer: Self-pay | Admitting: Internal Medicine

## 2020-04-20 ENCOUNTER — Ambulatory Visit (INDEPENDENT_AMBULATORY_CARE_PROVIDER_SITE_OTHER): Payer: BC Managed Care – PPO | Admitting: Internal Medicine

## 2020-04-20 ENCOUNTER — Ambulatory Visit: Payer: Self-pay | Admitting: Licensed Clinical Social Worker

## 2020-04-20 VITALS — BP 148/80 | HR 61 | Temp 97.3°F | Resp 18 | Ht 66.0 in | Wt 181.4 lb

## 2020-04-20 DIAGNOSIS — R5381 Other malaise: Secondary | ICD-10-CM

## 2020-04-20 DIAGNOSIS — R232 Flushing: Secondary | ICD-10-CM | POA: Diagnosis not present

## 2020-04-20 DIAGNOSIS — Z1379 Encounter for other screening for genetic and chromosomal anomalies: Secondary | ICD-10-CM | POA: Insufficient documentation

## 2020-04-20 DIAGNOSIS — Z800 Family history of malignant neoplasm of digestive organs: Secondary | ICD-10-CM

## 2020-04-20 DIAGNOSIS — C61 Malignant neoplasm of prostate: Secondary | ICD-10-CM

## 2020-04-20 DIAGNOSIS — Z8042 Family history of malignant neoplasm of prostate: Secondary | ICD-10-CM

## 2020-04-20 DIAGNOSIS — D649 Anemia, unspecified: Secondary | ICD-10-CM

## 2020-04-20 DIAGNOSIS — R5383 Other fatigue: Secondary | ICD-10-CM

## 2020-04-20 DIAGNOSIS — E782 Mixed hyperlipidemia: Secondary | ICD-10-CM

## 2020-04-20 DIAGNOSIS — Z803 Family history of malignant neoplasm of breast: Secondary | ICD-10-CM

## 2020-04-20 DIAGNOSIS — R1319 Other dysphagia: Secondary | ICD-10-CM

## 2020-04-20 NOTE — Progress Notes (Signed)
Metrics: Intervention Frequency ACO  Documented Smoking Status Yearly  Screened one or more times in 24 months  Cessation Counseling or  Active cessation medication Past 24 months  Past 24 months   Guideline developer: UpToDate (See UpToDate for funding source) Date Released: 2014       Wellness Office Visit  Subjective:  Patient ID: Scott [name], male    DOB: 1955/11/02  Age: 65 y.o. MRN: [id number]  CC: Patient comes in for follow-up of hot flashes relating to androgen deprivation therapy, insulin resistance, dyslipidemia and anemia. HPI  On the last visit, we increase the estradiol to 4 mg daily and is tolerated this well and he tells me that the hot flashes have now completely resolved. He has tolerated the higher dose of NP thyroid 60 mg daily without any problems. He is off prednisone now thankfully. He has been anemic, most likely due to chemotherapy and he would like his iron levels checked. He will see his oncologist and urologist in the next couple of months. As far as his prostate cancer is concerned, his PSA has come down quite nicely. Today he is complaining of solid food dysphagia in the esophageal area which he apparently had when he was much younger but this seems to have been a problem more recently.  He is able to swallow liquids apparently. Past Medical History:  Diagnosis Date  . Bladder obstruction   . Dyspnea   . Essential hypertension, benign 01/26/2019   at [place] office elevated, normal at home, no medications at this time  . Family history of breast cancer   . Family history of pancreatic cancer   . Family history of prostate cancer   . History of basal cell cancer   . HLD (hyperlipidemia) 01/26/2019  . Hypothyroidism    history of, not currently taking medication  . Prostate cancer ([place])   . Vitamin D deficiency disease 01/26/2019   Past Surgical History:  Procedure Laterality Date  . CYSTOSCOPY W/ URETERAL STENT PLACEMENT Left 11/10/2019    Procedure: CYSTOSCOPY;  Surgeon: Irine Seal, MD;  [facility];  Service: Urology;  Laterality: Left;  . HERNIA REPAIR  2025   UMBILICAL   . PROSTATE SURGERY    . SKIN CANCER EXCISION    . TONSILLECTOMY Bilateral   . TRANSURETHRAL RESECTION OF PROSTATE N/A 01/25/2017   Procedure: TRANSURETHRAL RESECTION OF THE PROSTATE (TURP);  Surgeon: Alexis Frock, MD;  [facility];  Service: Urology;  Laterality: N/A;  . TRANSURETHRAL RESECTION OF PROSTATE N/A 11/10/2019   Procedure: TRANSURETHRAL RESECTION OF THE PROSTATE (TURP);  Surgeon: Irine Seal, MD;  [facility];  Service: Urology;  Laterality: N/A;  . WISDOM TOOTH EXTRACTION       Family History  Problem Relation Age of Onset  . Emphysema Mother   . Alzheimer's disease Father   . Prostate cancer Father        dx in his 54s  . Prostate cancer Brother        dx in his 59s  . Breast cancer Maternal Grandmother        36s  . Pancreatic cancer Maternal Grandfather        23s  . Dementia Paternal Grandmother   . Heart attack Paternal Grandfather     Social History   Social History Narrative   Married for 19 years.Works for [employer] from home.   Social History   Tobacco Use  . Smoking status: Former Smoker    Packs/day: 1.50  Years: 35.00    Pack years: 52.50    Types: Cigarettes    Quit date: 01/12/2004    Years since quitting: 16.2  . Smokeless tobacco: Never Used  . Tobacco comment: OVER 30 YEARS HX OF SMOKING   Substance Use Topics  . Alcohol use: No    Current Meds  Medication Sig  . ASTRAGALUS PO Take 1 tablet by mouth 2 (two) times daily.  . Cholecalciferol 125 MCG (5000 UT) capsule Take 5,000-10,000 Units by mouth See admin instructions. Take 10000 units in the morning and 5000 units at night  . cimetidine (TAGAMET) 200 MG tablet Take 200-400 mg by mouth daily as needed (inflammation).   . DANDELION ROOT PO Take 500 mg by mouth daily.  Marland Kitchen estradiol (ESTRACE) 2 MG tablet Take 2 tablets (4  mg total) by mouth daily.  Marland Kitchen ibuprofen (ADVIL) 200 MG tablet Take 400 mg by mouth every 6 (six) hours as needed for moderate pain.  Marland Kitchen Leuprolide Acetate, 6 Month, (LUPRON DEPOT, 55-MONTH,) 45 MG injection Inject 45 mg into the muscle every 6 (six) months.  Marland Kitchen MAGNESIUM PO Take 1 tablet by mouth at bedtime.  Marland Kitchen MELATONIN PO Take 60 mg by mouth at bedtime.  Marland Kitchen MILK THISTLE PO Take 1 capsule by mouth daily.  . Misc Natural Products (TURMERIC CURCUMIN) CAPS Take 800 capsules by mouth 2 (two) times daily.  Marland Kitchen NATTOKINASE PO Take 2 capsules by mouth at bedtime.  . NP THYROID 60 MG tablet Take 1 tablet (60 mg total) by mouth daily before breakfast.  . OVER THE COUNTER MEDICATION Take 1 capsule by mouth 2 (two) times daily. [place] Tail Mushroom  Anti-Cancer /Immune Scientist, product/process development  . OVER THE COUNTER MEDICATION Place 1 Dose under the tongue 2 (two) times daily. CBD oil daily  . OVER THE COUNTER MEDICATION Take 1 capsule by mouth daily. Gamma E otc supplement  . OVER THE COUNTER MEDICATION Take 1 capsule by mouth 2 (two) times daily. zyflamend otc supplement  . QUERCETIN PO Take 1 tablet by mouth 2 (two) times daily.  Marland Kitchen VITAMIN K PO Take 1 capsule by mouth daily.  . Zinc 30 MG CAPS Take 30 mg by mouth daily.      Depression screen [facility] 2/9 04/20/2020 03/02/2020 07/27/2019  Decreased Interest 0 0 0  Down, Depressed, Hopeless 0 0 0  PHQ - 2 Score 0 0 0  Altered sleeping 0 0 -  Tired, decreased energy 0 0 -  Change in appetite 0 0 -  Feeling bad or failure about yourself  0 0 -  Trouble concentrating 0 0 -  Moving slowly or fidgety/restless 0 0 -  Suicidal thoughts 0 0 -  PHQ-9 Score 0 0 -  Difficult doing work/chores Not difficult at all Not difficult at all -     Objective:   Today's Vitals: BP (!) 148/80 (BP Location: Right Arm, Patient Position: Sitting, Cuff Size: Normal)   Pulse 61   Temp (!) 97.3 F (36.3 C) (Temporal)   Resp 18   Ht 5\' 6"  (1.676 m)   Wt 181 lb 6.4 oz (82.3 kg)   SpO2 98%    BMI 29.28 kg/m  Vitals with BMI 04/20/2020 03/22/2020 03/02/2020  Height 5\' 6"  - -  Weight 181 lbs 6 oz 182 lbs 10 oz -  BMI [id number] -  Systolic [id number]  Diastolic 80 67 71  Pulse 61 70 65     Physical Exam   He looks  systemically well.  Weight is largely unchanged from the last time I saw him.    Assessment   1. Hot flashes   2. Mixed hyperlipidemia   3. Malaise and fatigue   4. Anemia, unspecified type   5. Esophageal dysphagia       Tests ordered Orders Placed This Encounter  Procedures  . Estradiol  . COMPLETE METABOLIC PANEL WITH GFR  . Lipid panel  . T3, free  . TSH  . Iron,Total/Total Iron Binding Cap  . Ferritin  . Ambulatory referral to Gastroenterology     Plan: 1. He will continue with estradiol 4 mg daily and we will check estradiol levels today. 2. He will continue with the same dose of desiccated NP thyroid 60 mg daily and I will check thyroid function again to see if we need to further optimize. 3. Check iron studies. 4. Check a lipid panel. 5. I will refer him to gastroenterology for further evaluation of dysphagia. 6. Follow-up in about 3 months and further recommendations will depend on blood results   No orders of the defined types were placed in this encounter.   Doree Albee, MD

## 2020-04-20 NOTE — Telephone Encounter (Signed)
Revealed negative genetic testing.  Revealed that a VUS in [place] was identified.  We discussed that we do not know why he has cancer or why there is cancer in the family. It could be due to a different gene that we are not testing, or something our current technology cannot pick up.  It will be important for him to keep in contact with genetics to learn if additional testing may be needed in the future.

## 2020-04-20 NOTE — Progress Notes (Signed)
HPI:  Mr. Maddix was previously seen in the [place] clinic due to a personal and family history of cancer and concerns regarding a hereditary predisposition to cancer. Please refer to our prior cancer genetics clinic note for more information regarding our discussion, assessment and recommendations, at the time. Mr. Backlund recent genetic test results were disclosed to him, as were recommendations warranted by these results. These results and recommendations are discussed in more detail below.  CANCER HISTORY:  Oncology History  Prostate cancer metastatic to multiple sites [facility])  01/25/2017 Initial Diagnosis   Prostate cancer metastatic to multiple sites [facility])   11/16/2019 -  Chemotherapy   The patient had ondansetron (ZOFRAN) injection 8 mg, 8 mg (100 % of original dose 8 mg), Intravenous,  Once, 6 of 6 cycles Dose modification: 8 mg (original dose 8 mg, Cycle 1) pegfilgrastim-jmdb (FULPHILA) injection 6 mg, 6 mg, Subcutaneous,  Once, 6 of 6 cycles Administration: 6 mg (11/18/2019), 6 mg (12/09/2019), 6 mg (12/30/2019), 6 mg (01/20/2020), 6 mg (02/10/2020) ondansetron (ZOFRAN) 8 mg in sodium chloride 0.9 % 50 mL IVPB, , Intravenous,  Once, 4 of 4 cycles Administration: 8 mg (12/28/2019), 8 mg (01/18/2020), 8 mg (02/29/2020) DOCEtaxel (TAXOTERE) 140 mg in sodium chloride 0.9 % 250 mL chemo infusion, 75 mg/m2 = 140 mg, Intravenous,  Once, 6 of 6 cycles Administration: 140 mg (11/16/2019), 140 mg (12/07/2019), 140 mg (12/28/2019), 140 mg (01/18/2020), 110 mg (02/08/2020), 110 mg (02/29/2020)  for chemotherapy treatment.     Genetic Testing   Negative genetic testing. No pathogenic variants identified on the [facility] CancerNext-Expanded panel. VUS in [place] identified called c.61C>T. The report date is 04/19/2020.  The CancerNext-Expanded  gene panel offered by [facility] and includes sequencing and rearrangement analysis for the following 77 genes: IP, ALK, APC*, ATM*, AXIN2, BAP1, BARD1,  BLM, BMPR1A, BRCA1*, BRCA2*, BRIP1*, CDC73, CDH1*,CDK4, CDKN1B, CDKN2A, CHEK2*, CTNNA1, DICER1, FANCC, FH, FLCN, GALNT12, KIF1B, LZTR1, MAX, MEN1, MET, MLH1*, MSH2*, MSH3, MSH6*, MUTYH*, NBN, NF1*, NF2, NTHL1, PALB2*, PHOX2B, PMS2*, POT1, PRKAR1A, PTCH1, PTEN*, RAD51C*, RAD51D*,RB1, RECQL, RET, SDHA, SDHAF2, SDHB, SDHC, SDHD, SMAD4, SMARCA4, SMARCB1, SMARCE1, STK11, SUFU, TMEM127, TP53*,TSC1, TSC2, VHL and XRCC2 (sequencing and deletion/duplication); EGFR, EGLN1, HOXB13, KIT, MITF, PDGFRA, POLD1 and POLE (sequencing only); EPCAM and GREM1 (deletion/duplication only). DNA and RNA analyses performed for * genes.     FAMILY HISTORY:  We obtained a detailed, 4-generation family history.  Significant diagnoses are listed below: Family History  Problem Relation Age of Onset  . Emphysema Mother   . Alzheimer's disease Father   . Prostate cancer Father        dx in his 10s  . Prostate cancer Brother        dx in his 50s  . Breast cancer Maternal Grandmother        23s  . Pancreatic cancer Maternal Grandfather        69s  . Dementia Paternal Grandmother   . Heart attack Paternal Grandfather    Mr. Stiff has 1 son. He has 1 sister and 1 brother. His brother had prostate cancer at 60.   Mr. Barbero's mother died at 57, no cancer history. Patient had 1 maternal aunt, 1 maternal uncle, no cancers. No known cancers in maternal cousins; limited information on this side. Maternal grandmother died of breast cancer in her 27s, grandfather died of pancreatic cancer in his 28s.   Mr. Coughlin's father had prostate cancer at 80 and died at 72. He had 2  brothers and 1 sister, no cancers. No known cancers in paternal cousins. Paternal grandmother died of dementia, grandfather had heart issues. Grandmother's father died of prostate cancer.   Mr. Chance is unaware of previous family history of genetic testing for hereditary cancer risks. Patient's ancestors are of [place] descent. There is no reported  Ashkenazi Jewish ancestry. There is no known consanguinity.     GENETIC TEST RESULTS: Genetic testing reported out on 04/19/2020 through the [place] cancer panel found no pathogenic mutations.   The CancerNext-Expanded  gene panel offered by [facility] and includes sequencing and rearrangement analysis for the following 77 genes: IP, ALK, APC*, ATM*, AXIN2, BAP1, BARD1, BLM, BMPR1A, BRCA1*, BRCA2*, BRIP1*, CDC73, CDH1*,CDK4, CDKN1B, CDKN2A, CHEK2*, CTNNA1, DICER1, FANCC, FH, FLCN, GALNT12, KIF1B, LZTR1, MAX, MEN1, MET, MLH1*, MSH2*, MSH3, MSH6*, MUTYH*, NBN, NF1*, NF2, NTHL1, PALB2*, PHOX2B, PMS2*, POT1, PRKAR1A, PTCH1, PTEN*, RAD51C*, RAD51D*,RB1, RECQL, RET, SDHA, SDHAF2, SDHB, SDHC, SDHD, SMAD4, SMARCA4, SMARCB1, SMARCE1, STK11, SUFU, TMEM127, TP53*,TSC1, TSC2, VHL and XRCC2 (sequencing and deletion/duplication); EGFR, EGLN1, HOXB13, KIT, MITF, PDGFRA, POLD1 and POLE (sequencing only); EPCAM and GREM1 (deletion/duplication only).   The test report has been scanned into EPIC and is located under the Molecular Pathology section of the Results Review tab.  A portion of the result report is included below for reference.     We discussed with Mr. Meisenheimer that because current genetic testing is not perfect, it is possible there may be a gene mutation in one of these genes that current testing cannot detect, but that chance is small.  We also discussed, that there could be another gene that has not yet been discovered, or that we have not yet tested, that is responsible for the cancer diagnoses in the family. It is also possible there is a hereditary cause for the cancer in the family that Mr. Cazarez did not inherit and therefore was not identified in his testing.  Therefore, it is important to remain in touch with cancer genetics in the future so that we can continue to offer Mr. Mcfarlane the most up to date genetic testing.   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  At this time, it is unknown if this variant is associated with increased cancer risk or if this is a normal finding, but most variants such as this get reclassified to being inconsequential. It should not be used to make medical management decisions. With time, we suspect the lab will determine the significance of this variant, if any. If we do learn more about it, we will try to contact Mr. Barbian to discuss it further. However, it is important to stay in touch with [place] periodically and keep the address and phone number up to date.  ADDITIONAL GENETIC TESTING: We discussed with Mr. Gorniak that his genetic testing was fairly extensive.  If there are genes identified to increase cancer risk that can be analyzed in the future, we would be happy to discuss and coordinate this testing at that time.    CANCER SCREENING RECOMMENDATIONS: Mr. Wehrly test result is considered negative (normal).  This means that we have not identified a hereditary cause for his  personal and family history of cancer at this time. Most cancers happen by chance and this negative test suggests that his cancer may fall into this category.    While reassuring, this does not definitively rule out a hereditary predisposition to cancer. It is still possible  that there could be genetic mutations that are undetectable by current technology. There could be genetic mutations in genes that have not been tested or identified to increase cancer risk.  Therefore, it is recommended he continue to follow the cancer management and screening guidelines provided by his oncology and primary healthcare provider.   An individual's cancer risk and medical management are not determined by genetic test results alone. Overall cancer risk assessment incorporates additional factors, including personal medical history, family history, and any available genetic information that may result in a personalized plan for cancer  prevention and surveillance.  RECOMMENDATIONS FOR FAMILY MEMBERS:  Relatives in this family might be at some increased risk of developing cancer, over the general population risk, simply due to the family history of cancer.  We recommended male relatives in this family have a yearly mammogram beginning at age 64, or 85 years younger than the earliest onset of cancer, an annual clinical breast exam, and perform monthly breast self-exams. Male relatives in this family should also have a gynecological exam as recommended by their primary provider.  All family members should be referred for colonoscopy starting at age 55.    It is also possible there is a hereditary cause for the cancer in Mr. Barton family that he did not inherit and therefore was not identified in him.  Based on Mr. Alcocer family history, we recommended maternal relatives have genetic counseling and testing. Mr. Ohms will let us know if we can be of any assistance in coordinating genetic counseling and/or testing for these family members.  FOLLOW-UP: Lastly, we discussed with Mr. Mahaffy that cancer genetics is a rapidly advancing field and it is possible that new genetic tests will be appropriate for him and/or his family members in the future. We encouraged him to remain in contact with cancer genetics on an annual basis so we can update his personal and family histories and let him know of advances in cancer genetics that may benefit this family.   Our contact number was provided. Mr. Gobin questions were answered to his satisfaction, and he knows he is welcome to call us at anytime with additional questions or concerns.   Faith Rogue, MS, [facility] Genetic Counselor [email or web address] Phone: [phone]

## 2020-04-21 LAB — COMPLETE METABOLIC PANEL WITH GFR
AG Ratio: 2 (calc) (ref 1.0–2.5)
ALT: 7 U/L — ABNORMAL LOW (ref 9–46)
AST: 10 U/L (ref 10–35)
Albumin: 4 g/dL (ref 3.6–5.1)
Alkaline phosphatase (APISO): 51 U/L (ref 35–144)
BUN: 15 mg/dL (ref 7–25)
CO2: 24 mmol/L (ref 20–32)
Calcium: 9.2 mg/dL (ref 8.6–10.3)
Chloride: 104 mmol/L (ref 98–110)
Creat: 0.84 mg/dL (ref 0.70–1.25)
GFR, Est African American: 109 mL/min/{1.73_m2} (ref >=60)
GFR, Est Non African American: 94 mL/min/{1.73_m2} (ref >=60)
Globulin: 2 g/dL (calc) (ref 1.9–3.7)
Glucose, Bld: 90 mg/dL (ref 65–139)
Potassium: 4.5 mmol/L (ref 3.5–5.3)
Sodium: 136 mmol/L (ref 135–146)
Total Bilirubin: 0.6 mg/dL (ref 0.2–1.2)
Total Protein: 6 g/dL — ABNORMAL LOW (ref 6.1–8.1)

## 2020-04-21 LAB — LIPID PANEL
Cholesterol: 222 mg/dL — ABNORMAL HIGH (ref <200)
HDL: 43 mg/dL (ref >=40)
LDL Cholesterol (Calc): 148 mg/dL (calc) — ABNORMAL HIGH
Non-HDL Cholesterol (Calc): 179 mg/dL (calc) — ABNORMAL HIGH (ref <130)
Total CHOL/HDL Ratio: 5.2 (calc) — ABNORMAL HIGH (ref <5.0)
Triglycerides: 177 mg/dL — ABNORMAL HIGH (ref <150)

## 2020-04-21 LAB — TSH: TSH: 2.18 mIU/L (ref 0.40–4.50)

## 2020-04-21 LAB — T3, FREE: T3, Free: 4 pg/mL (ref 2.3–4.2)

## 2020-04-21 LAB — IRON, TOTAL/TOTAL IRON BINDING CAP
%SAT: 31 % (calc) (ref 20–48)
Iron: 88 ug/dL (ref 50–180)
TIBC: 280 mcg/dL (calc) (ref 250–425)

## 2020-04-21 LAB — ESTRADIOL: Estradiol: 77 pg/mL — ABNORMAL HIGH (ref <=39)

## 2020-04-21 LAB — FERRITIN: Ferritin: 45 ng/mL (ref 24–380)

## 2020-04-22 ENCOUNTER — Encounter (INDEPENDENT_AMBULATORY_CARE_PROVIDER_SITE_OTHER): Payer: Self-pay | Admitting: Internal Medicine

## 2020-04-22 ENCOUNTER — Other Ambulatory Visit (INDEPENDENT_AMBULATORY_CARE_PROVIDER_SITE_OTHER): Payer: Self-pay | Admitting: Internal Medicine

## 2020-04-22 MED ORDER — NP THYROID 90 MG PO TABS: 90.0000 mg | ORAL_TABLET | Freq: Every day | ORAL | 3 refills | Status: DC

## 2020-04-25 ENCOUNTER — Encounter: Payer: Self-pay | Admitting: Internal Medicine

## 2020-04-28 ENCOUNTER — Telehealth: Payer: Self-pay

## 2020-04-28 ENCOUNTER — Inpatient Hospital Stay (HOSPITAL_COMMUNITY): Payer: BC Managed Care – PPO | Attending: Hematology

## 2020-04-28 ENCOUNTER — Inpatient Hospital Stay (HOSPITAL_COMMUNITY): Payer: BC Managed Care – PPO

## 2020-04-28 ENCOUNTER — Other Ambulatory Visit: Payer: Self-pay

## 2020-04-28 DIAGNOSIS — M25559 Pain in unspecified hip: Secondary | ICD-10-CM | POA: Diagnosis not present

## 2020-04-28 DIAGNOSIS — E039 Hypothyroidism, unspecified: Secondary | ICD-10-CM | POA: Diagnosis not present

## 2020-04-28 DIAGNOSIS — G629 Polyneuropathy, unspecified: Secondary | ICD-10-CM | POA: Insufficient documentation

## 2020-04-28 DIAGNOSIS — C61 Malignant neoplasm of prostate: Secondary | ICD-10-CM | POA: Insufficient documentation

## 2020-04-28 DIAGNOSIS — Z87891 Personal history of nicotine dependence: Secondary | ICD-10-CM | POA: Diagnosis not present

## 2020-04-28 DIAGNOSIS — R5383 Other fatigue: Secondary | ICD-10-CM | POA: Diagnosis not present

## 2020-04-28 DIAGNOSIS — E785 Hyperlipidemia, unspecified: Secondary | ICD-10-CM | POA: Insufficient documentation

## 2020-04-28 DIAGNOSIS — Z79818 Long term (current) use of other agents affecting estrogen receptors and estrogen levels: Secondary | ICD-10-CM | POA: Diagnosis not present

## 2020-04-28 DIAGNOSIS — N1330 Unspecified hydronephrosis: Secondary | ICD-10-CM | POA: Insufficient documentation

## 2020-04-28 DIAGNOSIS — Z9221 Personal history of antineoplastic chemotherapy: Secondary | ICD-10-CM | POA: Diagnosis not present

## 2020-04-28 DIAGNOSIS — Z79899 Other long term (current) drug therapy: Secondary | ICD-10-CM | POA: Diagnosis not present

## 2020-04-28 DIAGNOSIS — Z923 Personal history of irradiation: Secondary | ICD-10-CM | POA: Diagnosis not present

## 2020-04-28 DIAGNOSIS — R232 Flushing: Secondary | ICD-10-CM | POA: Insufficient documentation

## 2020-04-28 DIAGNOSIS — I10 Essential (primary) hypertension: Secondary | ICD-10-CM | POA: Insufficient documentation

## 2020-04-28 DIAGNOSIS — C7951 Secondary malignant neoplasm of bone: Secondary | ICD-10-CM | POA: Insufficient documentation

## 2020-04-28 LAB — CBC WITH DIFFERENTIAL/PLATELET
Abs Immature Granulocytes: 0.02 10*3/uL (ref 0.00–0.07)
Basophils Absolute: 0.1 10*3/uL (ref 0.0–0.1)
Basophils Relative: 1 %
Eosinophils Absolute: 0.1 10*3/uL (ref 0.0–0.5)
Eosinophils Relative: 1 %
HCT: 36.4 % — ABNORMAL LOW (ref 39.0–52.0)
Hemoglobin: 12.2 g/dL — ABNORMAL LOW (ref 13.0–17.0)
Immature Granulocytes: 0 %
Lymphocytes Relative: 21 %
Lymphs Abs: 1.2 10*3/uL (ref 0.7–4.0)
MCH: 30.7 pg (ref 26.0–34.0)
MCHC: 33.5 g/dL (ref 30.0–36.0)
MCV: 91.7 fL (ref 80.0–100.0)
Monocytes Absolute: 0.5 10*3/uL (ref 0.1–1.0)
Monocytes Relative: 9 %
Neutro Abs: 4 10*3/uL (ref 1.7–7.7)
Neutrophils Relative %: 68 %
Platelets: 249 10*3/uL (ref 150–400)
RBC: 3.97 MIL/uL — ABNORMAL LOW (ref 4.22–5.81)
RDW: 11.9 % (ref 11.5–15.5)
WBC: 5.9 10*3/uL (ref 4.0–10.5)
nRBC: 0 % (ref 0.0–0.2)

## 2020-04-28 LAB — COMPREHENSIVE METABOLIC PANEL
ALT: 10 U/L (ref 0–44)
AST: 13 U/L — ABNORMAL LOW (ref 15–41)
Albumin: 3.8 g/dL (ref 3.5–5.0)
Alkaline Phosphatase: 49 U/L (ref 38–126)
Anion gap: 6 (ref 5–15)
BUN: 18 mg/dL (ref 8–23)
CO2: 22 mmol/L (ref 22–32)
Calcium: 8.7 mg/dL — ABNORMAL LOW (ref 8.9–10.3)
Chloride: 103 mmol/L (ref 98–111)
Creatinine, Ser: 0.8 mg/dL (ref 0.61–1.24)
GFR, Estimated: >60 mL/min (ref >60)
Glucose, Bld: 102 mg/dL — ABNORMAL HIGH (ref 70–99)
Potassium: 4 mmol/L (ref 3.5–5.1)
Sodium: 131 mmol/L — ABNORMAL LOW (ref 135–145)
Total Bilirubin: 0.6 mg/dL (ref 0.3–1.2)
Total Protein: 6.5 g/dL (ref 6.5–8.1)

## 2020-04-28 LAB — PSA: Prostatic Specific Antigen: 4.77 ng/mL — ABNORMAL HIGH (ref 0.00–4.00)

## 2020-04-28 MED ORDER — DUTASTERIDE 0.5 MG PO CAPS: 0.5000 mg | ORAL_CAPSULE | Freq: Every day | ORAL | 11 refills | Status: DC

## 2020-04-28 NOTE — Telephone Encounter (Signed)
Left message for pt to call back  °

## 2020-04-28 NOTE — Telephone Encounter (Signed)
Pt called and left message yesterday that he needed a refill on med. Sounded like Dutasteride but do not see this in chart. Maybe Estradiol. Attempted to call back but unable to leave voicemail.

## 2020-04-28 NOTE — Telephone Encounter (Signed)
Pt wanted refill of Dutasteride. Was not in current med list. Dr. Jeffie Pollock gave permission for refill. Prescription sent.

## 2020-05-03 ENCOUNTER — Inpatient Hospital Stay (HOSPITAL_COMMUNITY): Payer: BC Managed Care – PPO

## 2020-05-03 ENCOUNTER — Other Ambulatory Visit: Payer: Self-pay

## 2020-05-03 ENCOUNTER — Inpatient Hospital Stay (HOSPITAL_BASED_OUTPATIENT_CLINIC_OR_DEPARTMENT_OTHER): Payer: BC Managed Care – PPO | Admitting: Hematology

## 2020-05-03 VITALS — BP 154/76 | HR 62 | Temp 96.8°F | Resp 18 | Wt 181.2 lb

## 2020-05-03 DIAGNOSIS — C61 Malignant neoplasm of prostate: Secondary | ICD-10-CM

## 2020-05-03 NOTE — Patient Instructions (Signed)
[place] at [facility] Discharge Instructions  You were seen today by Dr. Delton Coombes. He went over your recent results. You had labs drawn today. Take 1 iron tablet every day to every other day to improve your hemoglobin count. Keep your appointment with Dr. Jeffie Pollock in March. Dr. Delton Coombes will see you back in 6 weeks for labs and follow up.   Thank you for choosing [place] at [facility] to provide your oncology and hematology care.  To afford each patient quality time with our provider, please arrive at least 15 minutes before your scheduled appointment time.   If you have a lab appointment with the [facility] please come in thru the Main Entrance and check in at the main information desk  You need to re-schedule your appointment should you arrive 10 or more minutes late.  We strive to give you quality time with our providers, and arriving late affects you and other patients whose appointments are after yours.  Also, if you no show three or more times for appointments you may be dismissed from the clinic at the providers discretion.     Again, thank you for choosing [facility].  Our hope is that these requests will decrease the amount of time that you wait before being seen by our physicians.       _____________________________________________________________  Should you have questions after your visit to [facility], please contact our office at [phone] between the hours of 8:00 a.m. and 4:30 p.m.  Voicemails left after 4:00 p.m. will not be returned until the following business day.  For prescription refill requests, have your pharmacy contact our office and allow 72 hours.    Cancer Center Support Programs:   > Cancer Support Group  2nd Tuesday of the month 1pm-2pm, Journey Room

## 2020-05-03 NOTE — Progress Notes (Signed)
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 TURP on 11/10/2019. 2. Docetaxel x 6 cycles from 11/16/2019 to 02/29/2020.  NGS Results: Not done  CURRENT THERAPY: Surveillance  BRIEF ONCOLOGIC HISTORY:  Oncology History  Prostate cancer metastatic to multiple sites [facility])  01/25/2017 Initial Diagnosis   Prostate cancer metastatic to multiple sites [facility])   11/16/2019 -  Chemotherapy   The patient had ondansetron (ZOFRAN) injection 8 mg, 8 mg (100 % of original dose 8 mg), Intravenous,  Once, 6 of 6 cycles Dose modification: 8 mg (original dose 8 mg, Cycle 1) pegfilgrastim-jmdb (FULPHILA) injection 6 mg, 6 mg, Subcutaneous,  Once, 6 of 6 cycles Administration: 6 mg (11/18/2019), 6 mg (12/09/2019), 6 mg (12/30/2019), 6 mg (01/20/2020), 6 mg (02/10/2020) ondansetron (ZOFRAN) 8 mg in sodium chloride 0.9 % 50 mL IVPB, , Intravenous,  Once, 4 of 4 cycles Administration: 8 mg (12/28/2019), 8 mg (01/18/2020), 8 mg (02/29/2020) DOCEtaxel (TAXOTERE) 140 mg in sodium chloride 0.9 % 250 mL chemo infusion, 75 mg/m2 = 140 mg, Intravenous,  Once, 6 of 6 cycles Administration: 140 mg (11/16/2019), 140 mg (12/07/2019), 140 mg (12/28/2019), 140 mg (01/18/2020), 110 mg (02/08/2020), 110 mg (02/29/2020)  for chemotherapy treatment.     Genetic Testing   Negative genetic testing. No pathogenic variants identified on the [facility] CancerNext-Expanded panel. VUS in [place] identified called [id number]. The report date is 04/19/2020.  The CancerNext-Expanded  gene panel offered by [facility] and includes sequencing and rearrangement analysis for the following 77 genes: IP, ALK, APC*, ATM*, AXIN2, BAP1, BARD1, BLM, BMPR1A, BRCA1*, BRCA2*, BRIP1*, CDC73, CDH1*,CDK4, CDKN1B, CDKN2A, CHEK2*,  CTNNA1, DICER1, FANCC, FH, FLCN, GALNT12, KIF1B, LZTR1, MAX, MEN1, MET, MLH1*, MSH2*, MSH3, MSH6*, MUTYH*, NBN, NF1*, NF2, NTHL1, PALB2*, PHOX2B, PMS2*, POT1, PRKAR1A, PTCH1, PTEN*, RAD51C*, RAD51D*,RB1, RECQL, RET, SDHA, SDHAF2, SDHB, SDHC, SDHD, SMAD4, SMARCA4, SMARCB1, SMARCE1, STK11, SUFU, TMEM127, TP53*,TSC1, TSC2, VHL and XRCC2 (sequencing and deletion/duplication); EGFR, EGLN1, HOXB13, KIT, MITF, PDGFRA, POLD1 and POLE (sequencing only); EPCAM and GREM1 (deletion/duplication only).      CANCER STAGING: Cancer Staging No matching staging information was found for the patient.  INTERVAL HISTORY:  Scott [name], a 65 y.o. 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 Scott [name] was last seen on 03/22/2020.   Today he reports feeling okay. He is down to taking 1 mg of estradiol and his hot flashes are well-controlled. He reports having numbness in the ball of his left foot with some burning pain radiating into his left toes, which is tolerable as long as it doesn't progresses. His low back and hip pain has returned, radiating down his left thigh. His energy levels are stable. He was taking iron tablets while getting chemo and denies having constipation while taking iron. He denies having any issue with urinating or hematuria, though he did pee out a flesh-colored chunk of tissue on 02/13.  He will see Dr. Jeffie Pollock in March.   REVIEW OF SYSTEMS:  Review of Systems  Constitutional: Positive for appetite change (75%) and fatigue (60%).  Endocrine: Positive for hot flashes (controlled w/ estradiol).  Genitourinary: Negative for difficulty urinating and hematuria.   Musculoskeletal: Positive for back pain (5/10 back and hips pain radiating down L thigh).  Neurological: Positive for numbness (L foot ball of foot and toes).  All other systems reviewed and are negative.   PAST MEDICAL/SURGICAL HISTORY:  Past Medical History:  Diagnosis Date  . Bladder  obstruction   . Dyspnea   . Essential hypertension, benign 01/26/2019   at [place] office elevated, normal at home, no medications at this time  . Family history of breast cancer   . Family history of pancreatic cancer   . Family history of prostate cancer   . History of basal cell cancer   . HLD (hyperlipidemia) 01/26/2019  . Hypothyroidism    history of, not currently taking medication  . Prostate cancer ([place])   . Vitamin D deficiency disease 01/26/2019   Past Surgical History:  Procedure Laterality Date  . CYSTOSCOPY W/ URETERAL STENT PLACEMENT Left 11/10/2019   Procedure: CYSTOSCOPY;  Surgeon: Irine Seal, MD;  Location: [facility];  Service: Urology;  Laterality: Left;  . HERNIA REPAIR  [id number]   UMBILICAL   . PROSTATE SURGERY    . SKIN CANCER EXCISION    . TONSILLECTOMY Bilateral   . TRANSURETHRAL RESECTION OF PROSTATE N/A 01/25/2017   Procedure: TRANSURETHRAL RESECTION OF THE PROSTATE (TURP);  Surgeon: Alexis Frock, MD;  Location: [facility];  Service: Urology;  Laterality: N/A;  . TRANSURETHRAL RESECTION OF PROSTATE N/A 11/10/2019   Procedure: TRANSURETHRAL RESECTION OF THE PROSTATE (TURP);  Surgeon: Irine Seal, MD;  Location: [facility];  Service: Urology;  Laterality: N/A;  . WISDOM TOOTH EXTRACTION      SOCIAL HISTORY:  Social History   Socioeconomic History  . Marital status: Married    Spouse name: Not on file  . Number of children: 1  . Years of education: Not on file  . Highest education level: Not on file  Occupational History    Comment: working full time  Tobacco Use  . Smoking status: Former Smoker    Packs/day: 1.50    Years: 35.00    Pack years: 52.50    Types: Cigarettes    Quit date: 01/12/2004    Years since quitting: 16.3  . Smokeless tobacco: Never Used  . Tobacco comment: OVER 30 YEARS HX OF SMOKING   Vaping Use  . Vaping Use: Never used  Substance and Sexual Activity  . Alcohol use: No  . Drug use: Not Currently  . Sexual activity: Not Currently   Other Topics Concern  . Not on file  Social History Narrative   Married for 19 years.Works for [employer] from home.   Social Determinants of Health   Financial Resource Strain: Low Risk   . Difficulty of Paying Living Expenses: Not hard at all  Food Insecurity: No Food Insecurity  . Worried About Charity fundraiser in the Last Year: Never true  . Ran Out of Food in the Last Year: Never true  Transportation Needs: No Transportation Needs  . Lack of Transportation (Medical): No  . Lack of Transportation (Non-Medical): No  Physical Activity: Inactive  . Days of Exercise per Week: 0 days  . Minutes of Exercise per Session: 0 min  Stress: No Stress Concern Present  . Feeling of Stress : Only a little  Social Connections: Moderately Integrated  . Frequency of Communication with Friends and Family: Three times a week  . Frequency of Social Gatherings with Friends and Family: Never  . Attends Religious Services: Never  . Active Member of Clubs or Organizations: Yes  . Attends Archivist Meetings: 1 to 4  times per year  . Marital Status: Married  Catering manager Violence: Not At Risk  . Fear of Current or Ex-Partner: No  . Emotionally Abused: No  . Physically Abused: No  . Sexually Abused: No    FAMILY HISTORY:  Family History  Problem Relation Age of Onset  . Emphysema Mother   . Alzheimer's disease Father   . Prostate cancer Father        dx in his 42s  . Prostate cancer Brother        dx in his 23s  . Breast cancer Maternal Grandmother        60s  . Pancreatic cancer Maternal Grandfather        93s  . Dementia Paternal Grandmother   . Heart attack Paternal Grandfather     CURRENT MEDICATIONS:  Current Outpatient Medications  Medication Sig Dispense Refill  . arginine 500 MG tablet Take 500 mg by mouth 2 (two) times daily.    . ASTRAGALUS PO Take 1 tablet by mouth 2 (two) times daily.    . Cholecalciferol 125 MCG (5000 UT) capsule Take  5,000-10,000 Units by mouth See admin instructions. Take 61356 units in the morning and 5000 units at night    . cimetidine (TAGAMET) 200 MG tablet Take 200-400 mg by mouth daily as needed (inflammation).     . DANDELION ROOT PO Take 500 mg by mouth daily.    Marland Kitchen dutasteride (AVODART) 0.5 MG capsule Take 1 capsule (0.5 mg total) by mouth daily. 30 capsule 11  . estradiol (ESTRACE) 2 MG tablet Take 2 tablets (4 mg total) by mouth daily. 60 tablet 3  . Leuprolide Acetate, 6 Month, (LUPRON DEPOT, 1-MONTH,) 45 MG injection Inject 45 mg into the muscle every 6 (six) months.    Marland Kitchen MAGNESIUM PO Take 1 tablet by mouth at bedtime.    Marland Kitchen MELATONIN PO Take 60 mg by mouth at bedtime.    Marland Kitchen MILK THISTLE PO Take 1 capsule by mouth daily.    . Misc Natural Products (TURMERIC CURCUMIN) CAPS Take 800 capsules by mouth 2 (two) times daily.    Marland Kitchen NATTOKINASE PO Take 2 capsules by mouth at bedtime.    . NP THYROID 90 MG tablet Take 1 tablet (90 mg total) by mouth daily. 30 tablet 3  . OVER THE COUNTER MEDICATION Take 1 capsule by mouth 2 (two) times daily. [place] Tail Mushroom  Anti-Cancer /Immune Presenter, broadcasting    . OVER THE COUNTER MEDICATION Place 1 Dose under the tongue 2 (two) times daily. CBD oil daily    . OVER THE COUNTER MEDICATION Take 1 capsule by mouth daily. Gamma E otc supplement    . OVER THE COUNTER MEDICATION Take 1 capsule by mouth 2 (two) times daily. zyflamend otc supplement    . QUERCETIN PO Take 1 tablet by mouth 2 (two) times daily.    Marland Kitchen VITAMIN K PO Take 1 capsule by mouth daily.    . Zinc 30 MG CAPS Take 30 mg by mouth daily.    Marland Kitchen ibuprofen (ADVIL) 200 MG tablet Take 400 mg by mouth every 6 (six) hours as needed for moderate pain. (Patient not taking: Reported on 05/03/2020)     No current facility-administered medications for this visit.   Facility-Administered Medications Ordered in Other Visits  Medication Dose Route Frequency Provider Last Rate Last Admin  . heparin lock flush 100 unit/mL  500  Units Intracatheter Once PRN Doreatha Massed, MD      .  sodium chloride flush (NS) 0.9 % injection 10 mL  10 mL Intracatheter PRN [name], MD        ALLERGIES:  Allergies  Allergen Reactions  . Wellbutrin [Bupropion] Hives    PHYSICAL EXAM:  Performance status (ECOG): 0 - Asymptomatic  Vitals:   05/03/20 1058  BP: (!) 154/76  Pulse: 62  Resp: 18  Temp: (!) 96.8 F (36 C)  SpO2: 99%   Wt Readings from Last 3 Encounters:  05/03/20 181 lb 3.2 oz (82.2 kg)  04/20/20 181 lb 6.4 oz (82.3 kg)  03/22/20 182 lb 9.6 oz (82.8 kg)   Physical Exam Vitals reviewed.  Constitutional:      Appearance: Normal appearance.  Cardiovascular:     Rate and Rhythm: Normal rate and regular rhythm.     Pulses: Normal pulses.     Heart sounds: Normal heart sounds.  Pulmonary:     Effort: Pulmonary effort is normal.     Breath sounds: Normal breath sounds.  Musculoskeletal:     Lumbar back: Bony tenderness (TTP in L sacroiliac joint) present.  Neurological:     General: No focal deficit present.     Mental Status: He is alert and oriented to person, place, and time.  Psychiatric:        Mood and Affect: Mood normal.        Behavior: Behavior normal.      LABORATORY DATA:  I have reviewed the labs as listed.  CBC Latest Ref Rng & Units 04/28/2020 03/22/2020 02/29/2020  WBC 4.0 - 10.5 K/uL 5.9 8.8 4.8  Hemoglobin 13.0 - 17.0 g/dL 12.2(L) 11.5(L) 11.9(L)  Hematocrit 39.0 - 52.0 % 36.4(L) 33.7(L) 35.8(L)  Platelets 150 - 400 K/uL 249 265 315   CMP Latest Ref Rng & Units 04/28/2020 04/20/2020 03/22/2020  Glucose 70 - 99 mg/dL 102(H) 90 103(H)  BUN 8 - 23 mg/dL _0 Creatinine 0.61 - 1.24 mg/dL 0.80 0.84 0.87  Sodium 135 - 145 mmol/L 131(L) 136 133(L)  Potassium 3.5 - 5.1 mmol/L 4.0 4.5 4.2  Chloride 98 - 111 mmol/L 103 104 100  CO2 22 - 32 mmol/L _1 Calcium 8.9 - 10.3 mg/dL 8.7(L) 9.2 9.2  Total Protein 6.5 - 8.1 g/dL 6.5 6.0(L) 6.4(L)  Total Bilirubin 0.3 - 1.2  mg/dL 0.6 0.6 0.9  Alkaline Phos 38 - 126 U/L 49 - 44  AST 15 - 41 U/L 13(L) 10 11(L)  ALT 0 - 44 U/L 10 7(L) 11   Lab Results  Component Value Date   TIBC 280 04/20/2020   FERRITIN 45 04/20/2020   IRONPCTSAT 31 04/20/2020   PSA 4.77 (H) 04/28/2020  PSA 2.17 03/22/2020  PSA 3.11 02/29/2020    DIAGNOSTIC IMAGING:  I have independently reviewed the scans and discussed with the patient. No results found.   ASSESSMENT:  1. 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 Prominent left hydronephrosis and hydroureter noted. New sclerotic lesion in the left anterior vertebral body of L1. Mild left periaortic adenopathy increased from prior, short axis lymph node measuring 1 cm. -Last PSA increased to 15.8 on 09/28/2019. -As there is rapid progression of disease, I have recommended docetaxel chemotherapy. He is agreeable after long discussion. -Cystoscopy with transurethral resection of tumor in the posterior bladder neck, right and left walls of the bladder,  right lobe of the prostate on 11/10/2019. -Plan was to do 6 cycles of docetaxel followed by abiraterone and prednisone. -6 cycles of docetaxel from 11/16/2019 through 02/29/2020. -CTAP on 01/06/2020 showed persistent thickening of the urinary bladder wall. Left hydronephrosis is significantly reduced compared to prior exam. Left-sided retroperitoneal and left iliac lymph nodes reduced in size. Interval increase in sclerosis of lesions of the left aspect of L2 vertebral body consistent with treatment response. No new bone lesions. -Bone scan on 01/06/2020 shows stable L2 metastatic focus.  2. Left hydroureteronephrosis: -CT scan showed prostate mass invading the posterior bladder and obstructing the ureter. Creatinine increased to 1.1. -Renal ultrasound on 12/11/2019 showed  interval resolution of previously seen left-sided hydronephrosis. Previously seen soft tissue mass at the posterior bladder lumen no longer seen.   PLAN:  1. 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 -He has not received Lupron since July 2021. -We will check testosterone level today.  He reports that his low back pain radiating down the lateral left thigh has come back.  This has subsided since he had a TURP procedure.  I have reviewed bone scan which did not show any major lesions in the lower back except L2.  He reports that he had this pain for many years. -He has an appointment to see Dr. Jeffie Pollock in mid March.  At this point I have recommended starting back on Lupron.  He will consider it when he sees Dr. Jeffie Pollock.  He also inquired about starting on Casodex.  I have recommended starting on second-generation medication like enzalutamide which is more effective.  We will discuss it at next visit again. -We will reevaluate him in 6 weeks with repeat PSA and testosterone level. -His hemoglobin is 12.2.  Most recent ferritin was 45.  Recommended taking iron tablet once every other day.  2. Left hydroureteronephrosis: -Most recent CT scan showed improvement in hydronephrosis.  Latest creatinine is 0.8.  3. Hot flashes: -He reportedly cut back on estradiol to 1 mg tablet daily which is prescribed by Dr. Anastasio Champion.  4. Peripheral neuropathy: -He has left foot neuropathy which is stable.  Occasional neuropathy in the fingertips. -We will consider Cymbalta if there is any worsening.   Orders placed this encounter:  Orders Placed This Encounter  Procedures  . Testosterone     [name], MD [place] [phone]   I, Milinda Antis, am acting as a scribe for Dr. Sanda Linger.  I, [name] MD, have reviewed the above documentation for accuracy and completeness, and I agree  with the above.

## 2020-05-04 ENCOUNTER — Encounter (INDEPENDENT_AMBULATORY_CARE_PROVIDER_SITE_OTHER): Payer: Self-pay | Admitting: Internal Medicine

## 2020-05-04 LAB — TESTOSTERONE: Testosterone: <3 ng/dL — ABNORMAL LOW (ref 264–916)

## 2020-05-26 ENCOUNTER — Other Ambulatory Visit: Payer: Self-pay

## 2020-05-26 ENCOUNTER — Other Ambulatory Visit: Payer: BC Managed Care – PPO

## 2020-05-26 DIAGNOSIS — C61 Malignant neoplasm of prostate: Secondary | ICD-10-CM

## 2020-05-26 LAB — PSA: PSA: 12.9

## 2020-05-27 LAB — ESTRADIOL: Estradiol: 42.3 pg/mL (ref 7.6–42.6)

## 2020-05-27 LAB — TESTOSTERONE: Testosterone: <3 ng/dL — ABNORMAL LOW (ref 264–916)

## 2020-05-27 LAB — PSA: Prostate Specific Ag, Serum: 12.9 ng/mL — ABNORMAL HIGH (ref 0.0–4.0)

## 2020-05-28 NOTE — Progress Notes (Unsigned)
Referring Provider: Doree Albee, MD Primary Care Physician:  Doree Albee, MD Primary Gastroenterologist:  Dr. Gala Romney  Chief Complaint  Patient presents with  . Dysphagia    Food/pills get stuck    HPI:   Scott [name] is a 65 y.o. male presenting today at the request of Doree Albee, MD for dysphagia. 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   For several years, he has had trouble with swallowing large pills, meat, or broccoli. Items get hung in the mid sternal area. Occasional regurgitation. Some worsening over the last 2 years. Very rare acid reflux. Taking cimetidine fairly regularly, not for acid reflux, but for antiinflammatory and anti cancer effects. No prior EGD. Taking ibuprofen or naproxen several days a week for back pain.  For years, he will have spells or if he has to take ibuprofen or naproxen for his back pain.  Also reports he is taking Vicodin as needed for back pain which is from a prior old prescription.   No nausea or vomiting. Occasional abdominal pain, but nothing significant or routine.  Pain occurs at his hernia site which is just above his umbilicus and always seems to be associated with eating granola. Resolves within a couple of hours.   Bowels are regular. No brbpr or melena. No prior colonoscopy. History of diverticulitis in 2015.   Likely starting Zytiga in the near future.  Past Medical History:  Diagnosis Date  . Bladder obstruction   . Diverticulitis 2015  . Dyspnea   . Essential hypertension, benign 01/26/2019   at [place] office elevated, normal at home, no medications at this time  . History of basal cell cancer   . HLD (hyperlipidemia) 01/26/2019  . Hypothyroidism    history of, not currently taking medication  . Prostate cancer ([place])    metastatic to bones  . Vitamin D deficiency disease 01/26/2019    Past Surgical History:  Procedure  Laterality Date  . CYSTOSCOPY W/ URETERAL STENT PLACEMENT Left 11/10/2019   Procedure: CYSTOSCOPY;  Surgeon: Irine Seal, MD;  Location: [facility];  Service: Urology;  Laterality: Left;  . HERNIA REPAIR  1607   UMBILICAL   . PROSTATE SURGERY    . SKIN CANCER EXCISION    . TONSILLECTOMY Bilateral   . TRANSURETHRAL RESECTION OF PROSTATE N/A 01/25/2017   Procedure: TRANSURETHRAL RESECTION OF THE PROSTATE (TURP);  Surgeon: Alexis Frock, MD;  Location: [facility];  Service: Urology;  Laterality: N/A;  . TRANSURETHRAL RESECTION OF PROSTATE N/A 11/10/2019   Procedure: TRANSURETHRAL RESECTION OF THE PROSTATE (TURP);  Surgeon: Irine Seal, MD;  Location: [facility];  Service: Urology;  Laterality: N/A;  . WISDOM TOOTH EXTRACTION      Current Outpatient Medications  Medication Sig Dispense Refill  . arginine 500 MG tablet Take 500 mg by mouth 2 (two) times daily.    . ASTRAGALUS PO Take 1 tablet by mouth 2 (two) times daily.    . Cholecalciferol 125 MCG (5000 UT) capsule Take 5,000-10,000 Units by mouth See admin instructions. Take 10000 units in the morning and 5000 units at night    . cimetidine (TAGAMET) 200 MG tablet Take 200-400 mg by mouth daily as needed (inflammation).     . DANDELION ROOT PO Take 500 mg by mouth daily.    Marland Kitchen dutasteride (AVODART) 0.5 MG capsule Take 1 capsule (0.5 mg total) by mouth daily. 30 capsule 11  . estradiol (ESTRACE) 2  MG tablet Take 2 tablets (4 mg total) by mouth daily. (Patient taking differently: Take 2 mg by mouth daily.) 60 tablet 3  . ibuprofen (ADVIL) 200 MG tablet Take 400 mg by mouth every 6 (six) hours as needed for moderate pain.    . IRON HEME POLYPEPTIDE PO Take by mouth.    . Leuprolide Acetate, 6 Month, (LUPRON DEPOT, 67-MONTH,) 45 MG injection Inject 45 mg into the muscle every 6 (six) months.    Marland Kitchen MAGNESIUM PO Take 1 tablet by mouth at bedtime.    Marland Kitchen MELATONIN PO Take 60 mg by mouth at bedtime.    Marland Kitchen MILK THISTLE PO Take 1 capsule by mouth daily.    . Misc  Natural Products (TURMERIC CURCUMIN) CAPS Take by mouth 2 (two) times daily. 400    . NATTOKINASE PO Take 2 capsules by mouth daily.    . NP THYROID 90 MG tablet Take 1 tablet (90 mg total) by mouth daily. 30 tablet 3  . OVER THE COUNTER MEDICATION Take 1 capsule by mouth 2 (two) times daily. [place] Mushroom  Anti-Cancer /Immune Scientist, product/process development    . OVER THE COUNTER MEDICATION Take 1 capsule by mouth daily. Gamma E otc supplement    . OVER THE COUNTER MEDICATION Take 1 capsule by mouth 2 (two) times daily. zyflamend otc supplement    . QUERCETIN PO Take 1 tablet by mouth 2 (two) times daily.    Marland Kitchen VITAMIN K PO Take 1 capsule by mouth daily.    . Zinc 30 MG CAPS Take 30 mg by mouth daily.    Marland Kitchen OVER THE COUNTER MEDICATION Place 1 Dose under the tongue 2 (two) times daily. CBD oil daily (Patient not taking: Reported on 05/30/2020)     No current facility-administered medications for this visit.   Facility-Administered Medications Ordered in Other Visits  Medication Dose Route Frequency Provider Last Rate Last Admin  . heparin lock flush 100 unit/mL  500 Units Intracatheter Once PRN [name], MD      . sodium chloride flush (NS) 0.9 % injection 10 mL  10 mL Intracatheter PRN [name], MD        Allergies as of 05/30/2020 - Review Complete 05/30/2020  Allergen Reaction Noted  . Wellbutrin [bupropion] Hives 12/18/2016    Family History  Problem Relation Age of Onset  . Emphysema Mother   . Alzheimer's disease Father   . Prostate cancer Father        dx in his 9s  . Prostate cancer Brother        dx in his 32s  . Breast cancer Maternal Grandmother        82s  . Pancreatic cancer Maternal Grandfather        39s  . Dementia Paternal Grandmother   . Heart attack Paternal Grandfather   . Colon cancer Neg Hx     Social History   Socioeconomic History  . Marital status: Married    Spouse name: Not on file  . Number of children: 1  . Years of education: Not on  file  . Highest education level: Not on file  Occupational History    Comment: working full time  Tobacco Use  . Smoking status: Former Smoker    Packs/day: 1.50    Years: 35.00    Pack years: 52.50    Types: Cigarettes    Quit date: 01/12/2004    Years since quitting: 16.3  . Smokeless tobacco: Never Used  .  Tobacco comment: OVER 30 YEARS HX OF SMOKING   Vaping Use  . Vaping Use: Never used  Substance and Sexual Activity  . Alcohol use: Not Currently    Comment: heavy drinker until 2004  . Drug use: Not Currently    Types: Marijuana  . Sexual activity: Not Currently  Other Topics Concern  . Not on file  Social History Narrative   Married for 19 years.Works for [employer] from home.   Social Determinants of Health   Financial Resource Strain: Low Risk   . Difficulty of Paying Living Expenses: Not hard at all  Food Insecurity: No Food Insecurity  . Worried About Charity fundraiser in the Last Year: Never true  . Ran Out of Food in the Last Year: Never true  Transportation Needs: No Transportation Needs  . Lack of Transportation (Medical): No  . Lack of Transportation (Non-Medical): No  Physical Activity: Inactive  . Days of Exercise per Week: 0 days  . Minutes of Exercise per Session: 0 min  Stress: No Stress Concern Present  . Feeling of Stress : Only a little  Social Connections: Moderately Integrated  . Frequency of Communication with Friends and Family: Three times a week  . Frequency of Social Gatherings with Friends and Family: Never  . Attends Religious Services: Never  . Active Member of Clubs or Organizations: Yes  . Attends Archivist Meetings: 1 to 4 times per year  . Marital Status: Married  Human resources officer Violence: Not At Risk  . Fear of Current or Ex-Partner: No  . Emotionally Abused: No  . Physically Abused: No  . Sexually Abused: No    Review of Systems: Gen: Denies any fever, chills, cold or flulike symptoms,  lightheadedness, dizziness, presyncope, syncope. CV: Denies chest pain or palpitations. Resp: Admits to mild shortness of breath with exertion.  No regular cough. GI: See HPI MS: Admits to chronic back pain. Derm: Denies rash Psych: Denies depression or anxiety Heme: See HPI  Physical Exam: BP (!) 152/85   Pulse (!) 59   Temp (!) 96.6 F (35.9 C) (Temporal)   Ht 5\' 6"  (1.676 m)   Wt 184 lb 9.6 oz (83.7 kg)   BMI 29.80 kg/m  General:   Alert and oriented. Pleasant and cooperative. Well-nourished and well-developed.  Head:  Normocephalic and atraumatic. Eyes:  Without icterus, sclera clear and conjunctiva pink.  Ears:  Normal auditory acuity. Lungs:  Clear to auscultation bilaterally. No wheezes, rales, or rhonchi. No distress.  Heart:  S1, S2 present without murmurs appreciated.  Abdomen:  +BS, soft, non-tender and non-distended. Small, dime sized defect in the abdominal wall with reducible hernia just above and to the left of the umbilicus. No HSM noted. No guarding or rebound.  Rectal:  Deferred  Msk:  Symmetrical without gross deformities. Normal posture. Extremities:  Without edema. Neurologic:  Alert and  oriented x4;  grossly normal neurologically. Skin:  Intact without significant lesions or rashes. Psych: Normal mood and affect.   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  Dysphagia: Patient reports several year history of solid food and pill dysphagia that is been worsening over the last 2 years.  Items get hung up in the midsternal area and occasionally require regurgitation.  Very rare acid reflux symptoms, currently taking cimetidine for non-GERD reasons (per patient, for anticancer/anti-inflammatory  effects).  Notably, he has chronic history of intermittent periods of NSAID use due to  back pain.  Currently taking ibuprofen or naproxen several days a week.   Differentials include esophageal web, ring, stricture, EOE, and cannot rule out malignancy.  He will need EGD for further evaluation.  Colon cancer screening: Patient is in need of first-ever screening colonoscopy.  No significant lower GI symptoms.  No BRBPR or melena.  No family history of colon cancer.  We will arrange for him to have a colonoscopy in the near future.   Periumbilical hernia: Small reducible periumbilical hernia on exam.  Patient reports he occasionally has abdominal pain in this area if eating a granola.  Otherwise, no significant abdominal pain and hernia reduces on its own.  He is not interested in surgical referral at this time.  Advised to call if he desired surgical referral.  We also discussed ED precautions including persistent pain in this area or if his hernia would not reduce.   Plan: 1.  Proceed with colonoscopy and EGD +/-dilation with propofol with Dr. Gala Romney in the near future. The risks, benefits, and alternatives have been discussed with the patient in detail. The patient states understanding and desires to proceed.  ASA III Hold iron x7 days prior to procedure.  2.  Eat slowly, take small bites, chew thoroughly, drink plenty of liquids throughout meals.  3.  Meats should be chopped finely.  4.  Advised to call if he desires a referral to general surgery for periumbilical hernia.  5.  Proceed to the emergency room if any persistent pain at hernia site or if hernia will not reduce.  6.  Follow-up after procedures.  Call with questions or concerns prior.   Aliene Altes, PA-C [facility] 05/30/2020

## 2020-05-30 ENCOUNTER — Ambulatory Visit (INDEPENDENT_AMBULATORY_CARE_PROVIDER_SITE_OTHER): Payer: BC Managed Care – PPO | Admitting: Gastroenterology

## 2020-05-30 ENCOUNTER — Encounter: Payer: Self-pay | Admitting: Gastroenterology

## 2020-05-30 VITALS — BP 152/85 | HR 59 | Temp 96.6°F | Ht 66.0 in | Wt 184.6 lb

## 2020-05-30 DIAGNOSIS — R1310 Dysphagia, unspecified: Secondary | ICD-10-CM | POA: Diagnosis not present

## 2020-05-30 DIAGNOSIS — K429 Umbilical hernia without obstruction or gangrene: Secondary | ICD-10-CM | POA: Diagnosis not present

## 2020-05-30 DIAGNOSIS — Z1211 Encounter for screening for malignant neoplasm of colon: Secondary | ICD-10-CM | POA: Insufficient documentation

## 2020-05-30 NOTE — Progress Notes (Signed)
Cc'ed to pcp °

## 2020-05-30 NOTE — Patient Instructions (Signed)
We will arrange for you to have a colonoscopy and upper endoscopy with possible stretching of your esophagus in the near future with Dr. Gala Romney. Hold iron for 7 days prior to your procedures.  For now, I recommend you eat slowly, take small bites, chew thoroughly, and drink plenty of liquids throughout your meals.  Meats should be chopped finely.  As we discussed, if you have any pain at your hernia site that persist or if your hernia protrudes and will not reduce, you should proceed to the emergency room.  If you desire a surgical referral to consider correction of your hernia, please let me know.  We will plan to see back in the office after your procedures.  Do not hesitate to call if you have any questions or concerns prior.  It was great to meet you today!  Aliene Altes, PA-C [facility]

## 2020-06-01 NOTE — Progress Notes (Signed)
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  His PSA has been rising despite 6 rounds of chemo.   He is seeing Dr. Anastasio Champion and is on oral estradiol 1mg  po bid but the hot flashes have returned.   He remains on dutasteride and Lupron and his T is  <3 and PSA is up to 12.9 on 05/26/20 from 4.77 on 04/28/20.  He is voiding well and his Cr was 0.8 on 2/10.  He has back pain that improved with chemo and prednisone but it has recurred.   His bone scan on 01/14/20 demonstrated stable L2 mets.  The CT showed improved left hydro with minimal residual bilateral hydro.  He is due for Lupron.  He is voiding well.  His IPSS is 6.  He has a tooth that needs extraction.     12/04/19: Scott [name] returns today in f/u from a TURP on 11/10/19 with resection of both UO's for malignant obstruction.   He is voiding better but has some frequency and nocturia 2-4x.  He has some left flank pain with voiding and probably has reflux.   His IPSS is 14.   He has had some hematuria but is improving.   He has some bone pain with the fulphila given for bone marrow support.   He is otherwise tolerating chemo after one dose.  His Cr is down to 0.9 from 1.1 prior to the resection.   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 CT with bilateral non-bulky iliac adenopathy and tiny uptake Lt orbit (no spine mets) by bone scan.  His PSA is up to 15.5 from  9.47 at last check and a doubling over the last year. The testosterone remains castrate at <3 on Lupron and dutasteride. He remains on estradiol 0.1mg  patches for the hot flashes.   He had a telehealth visit with Dr. Tammi Klippel for consideration of radiation therapy to the primary and pelvic nodes from the [place] PET findings on 12/11/18 but decided against that.  He is seeing Dr. Delton Coombes as well but is  reluctant to consider Zytiga  because of the prednisone.   He has had no recent hematuria.  He has occasional frequency with urgency and UUI.  He wears a pad. He has a reduced stream.  He has nocturia 3-4x but he is drinking a lot of fluids.   His IPSS is 29-30.   Present Management:  01/2017 begin Lupron androgen deprivation Q7mo (declined reccomended orchiectomy)  04/2017 - PSA 2.86;  07/2017 PSA/T 3.44 / T 3 ==> Lupron 45mg .  10/08/17 PSA 2.1.  02/07/18 PSA 2.19/T 4. added Estradiol 0.1mg  patches twice weekly for the hot flashes.  03/10/18 PSA 1.87/T<3.  05/09/18 PSA 2.09/T10/ Est 32.  07/24/18 PSA 6/T 54  08/08/18 Lupron 45mg .  09/08/18 PSA 4.37  10/22/18 PSA 4.64/T<3.  11/26/18 Bone scan negative/ CT C/A/P negative. 12/11/18 [facility] PET small equivocal periaortic and left ext iliac nodes and right prostate activity.  I have discussed that with him.  11/28/18 PSA 6.46. 02/10/19 PSA 7.31/T <3. 03/19/19 PSA 8.20 04/11/19  Lupron 45mg  given.  04/23/19 PSA 9.96. 06/23/19 PSA 9.47. 09/28/19 PSA 15.56, T <3.  10/09/19 Lupron 45mg  given 11/10/19  Operative cysto / TURP 2018 with inviltrative bladder neck / prostate mass ( left UO purposefully resected).  11/16/19 PSA  20.69.   11/06/19 Mr.Stoklosa has a mass at the base of the bladder consistent with local invasion from his CRCP and he has left ureteral obstruction with a minor increase in his Cr.  He has seen Dr. Delton Coombes and is scheduled to begin chemotherapy but it was felt that decompression of the left kidney was indicated.   He is have increased voiding difficulty with passage of clots as well.    Results for GUSTAV, KNUEPPEL (MRN [id number]) as of 07/10/2019 08:56  Ref. Range 11/28/2018 10:23 02/10/2019 13:25 03/19/2019 13:49 04/23/2019 13:58 06/23/2019 13:41  Prostatic Specific Antigen Latest Ref Range: 0.00 - 4.00 ng/mL 6.46 (H) 7.31 (H) 8.20 (H) 9.96 (H) 9.47 (H)      IPSS    Row Name 06/02/20 1000         International Prostate Symptom Score   How often have you had the sensation of  not emptying your bladder? Not at All     How often have you had to urinate less than every two hours? Less than half the time     How often have you found you stopped and started again several times when you urinated? Not at All     How often have you found it difficult to postpone urination? Less than half the time     How often have you had a weak urinary stream? Not at All     How often have you had to strain to start urination? Not at All     How many times did you typically get up at night to urinate? 2 Times     Total IPSS Score 6           Quality of Life due to urinary symptoms   If you were to spend the rest of your life with your urinary condition just the way it is now how would you feel about that? Pleased          He has gotten his COVID vaccine.      ROS:  ROS:  A complete review of systems was performed.  All systems are negative except for pertinent findings as noted.   ROS  Allergies  Allergen Reactions  . Wellbutrin [Bupropion] Hives    Outpatient Encounter Medications as of 06/02/2020  Medication Sig Note  . arginine 500 MG tablet Take 500 mg by mouth 2 (two) times daily.   . ASTRAGALUS PO Take 1 tablet by mouth 2 (two) times daily.   . Cholecalciferol 125 MCG (5000 UT) capsule Take 5,000-10,000 Units by mouth See admin instructions. Take 10000 units in the morning and 5000 units at night   . cimetidine (TAGAMET) 200 MG tablet Take 200-400 mg by mouth daily as needed (inflammation).  11/09/2019: Pt confirms he takes this as needed for inflammation  . DANDELION ROOT PO Take 500 mg by mouth daily.   Marland Kitchen dutasteride (AVODART) 0.5 MG capsule Take 1 capsule (0.5 mg total) by mouth daily.   Marland Kitchen estradiol (ESTRACE) 2 MG tablet Take 2 tablets (4 mg total) by mouth daily. (Patient taking differently: Take 2 mg by mouth daily.) 04/20/2020: Still taking 4 mg daily.no complaints.  Marland Kitchen ibuprofen (ADVIL) 200 MG tablet Take 400 mg by mouth every 6 (six) hours as needed for  moderate pain.   . IRON HEME POLYPEPTIDE PO Take by mouth.   . Leuprolide Acetate, 6 Month, (LUPRON DEPOT, 45-MONTH,) 45 MG injection Inject 45 mg into the muscle every 6 (six) months.   Marland Kitchen  MAGNESIUM PO Take 1 tablet by mouth at bedtime.   Marland Kitchen MELATONIN PO Take 60 mg by mouth at bedtime.   Marland Kitchen MILK THISTLE PO Take 1 capsule by mouth daily.   . Misc Natural Products (TURMERIC CURCUMIN) CAPS Take by mouth 2 (two) times daily. 400   . NATTOKINASE PO Take 2 capsules by mouth daily.   . NP THYROID 90 MG tablet Take 1 tablet (90 mg total) by mouth daily.   Marland Kitchen OVER THE COUNTER MEDICATION Take 1 capsule by mouth 2 (two) times daily. [place] Tail Mushroom  Anti-Cancer /Immune Scientist, product/process development   . OVER THE COUNTER MEDICATION Place 1 Dose under the tongue 2 (two) times daily. CBD oil daily   . OVER THE COUNTER MEDICATION Take 1 capsule by mouth daily. Gamma E otc supplement   . OVER THE COUNTER MEDICATION Take 1 capsule by mouth 2 (two) times daily. zyflamend otc supplement   . QUERCETIN PO Take 1 tablet by mouth 2 (two) times daily.   Marland Kitchen VITAMIN K PO Take 1 capsule by mouth daily.   . Zinc 30 MG CAPS Take 30 mg by mouth daily.    Facility-Administered Encounter Medications as of 06/02/2020  Medication  . heparin lock flush 100 unit/mL  . [COMPLETED] leuprolide (6 Month) (ELIGARD) injection 45 mg  . sodium chloride flush (NS) 0.9 % injection 10 mL  . [DISCONTINUED] Leuprolide Acetate (6 Month) (LUPRON) injection 45 mg    Past Medical History:  Diagnosis Date  . Bladder obstruction   . Diverticulitis 2015  . Dyspnea   . Essential hypertension, benign 01/26/2019   at [place] office elevated, normal at home, no medications at this time  . History of basal cell cancer   . HLD (hyperlipidemia) 01/26/2019  . Hypothyroidism    history of, not currently taking medication  . Prostate cancer ([place])    metastatic to bones  . Vitamin D deficiency disease 01/26/2019    Past Surgical History:  Procedure Laterality Date   . CYSTOSCOPY W/ URETERAL STENT PLACEMENT Left 11/10/2019   Procedure: CYSTOSCOPY;  Surgeon: Irine Seal, MD;  Location: [facility];  Service: Urology;  Laterality: Left;  . HERNIA REPAIR  [id number]   UMBILICAL   . PROSTATE SURGERY    . SKIN CANCER EXCISION    . TONSILLECTOMY Bilateral   . TRANSURETHRAL RESECTION OF PROSTATE N/A 01/25/2017   Procedure: TRANSURETHRAL RESECTION OF THE PROSTATE (TURP);  Surgeon: Alexis Frock, MD;  Location: [facility];  Service: Urology;  Laterality: N/A;  . TRANSURETHRAL RESECTION OF PROSTATE N/A 11/10/2019   Procedure: TRANSURETHRAL RESECTION OF THE PROSTATE (TURP);  Surgeon: Irine Seal, MD;  Location: [facility];  Service: Urology;  Laterality: N/A;  . WISDOM TOOTH EXTRACTION      Social History   Socioeconomic History  . Marital status: Married    Spouse name: Not on file  . Number of children: 1  . Years of education: Not on file  . Highest education level: Not on file  Occupational History    Comment: working full time  Tobacco Use  . Smoking status: Former Smoker    Packs/day: 1.50    Years: 35.00    Pack years: 52.50    Types: Cigarettes    Quit date: 01/12/2004    Years since quitting: 16.4  . Smokeless tobacco: Never Used  . Tobacco comment: OVER 30 YEARS HX OF SMOKING   Vaping Use  . Vaping Use: Never used  Substance and Sexual Activity  . Alcohol  use: Not Currently    Comment: heavy drinker until 2004  . Drug use: Not Currently    Types: Marijuana  . Sexual activity: Not Currently  Other Topics Concern  . Not on file  Social History Narrative   Married for 19 years.Works for [employer] from home.   Social Determinants of Health   Financial Resource Strain: Low Risk   . Difficulty of Paying Living Expenses: Not hard at all  Food Insecurity: No Food Insecurity  . Worried About Charity fundraiser in the Last Year: Never true  . Ran Out of Food in the Last Year: Never true  Transportation Needs: No Transportation Needs  . Lack of  Transportation (Medical): No  . Lack of Transportation (Non-Medical): No  Physical Activity: Inactive  . Days of Exercise per Week: 0 days  . Minutes of Exercise per Session: 0 min  Stress: No Stress Concern Present  . Feeling of Stress : Only a little  Social Connections: Moderately Integrated  . Frequency of Communication with Friends and Family: Three times a week  . Frequency of Social Gatherings with Friends and Family: Never  . Attends Religious Services: Never  . Active Member of Clubs or Organizations: Yes  . Attends Archivist Meetings: 1 to 4 times per year  . Marital Status: Married  Human resources officer Violence: Not At Risk  . Fear of Current or Ex-Partner: No  . Emotionally Abused: No  . Physically Abused: No  . Sexually Abused: No    Family History  Problem Relation Age of Onset  . Emphysema Mother   . Alzheimer's disease Father   . Prostate cancer Father        dx in his 75s  . Prostate cancer Brother        dx in his 95s  . Breast cancer Maternal Grandmother        22s  . Pancreatic cancer Maternal Grandfather        71s  . Dementia Paternal Grandmother   . Heart attack Paternal Grandfather   . Colon cancer Neg Hx        Objective: Vitals:   06/02/20 1003  BP: (!) 157/80  Pulse: 76  Temp: 98.3 F (36.8 C)     Physical ExamI  Recent Results (from the past 2160 hour(s))  CBC with Differential     Status: Abnormal   Collection Time: 03/22/20 10:39 AM  Result Value Ref Range   WBC 8.8 4.0 - 10.5 K/uL   RBC 3.48 (L) 4.22 - 5.81 MIL/uL   Hemoglobin 11.5 (L) 13.0 - 17.0 g/dL   HCT 33.7 (L) 39.0 - 52.0 %   MCV 96.8 80.0 - 100.0 fL   MCH 33.0 26.0 - 34.0 pg   MCHC 34.1 30.0 - 36.0 g/dL   RDW 13.2 11.5 - 15.5 %   Platelets 265 150 - 400 K/uL   nRBC 0.0 0.0 - 0.2 %   Neutrophils Relative % 72 %   Neutro Abs 6.5 1.7 - 7.7 K/uL   Lymphocytes Relative 12 %   Lymphs Abs 1.1 0.7 - 4.0 K/uL   Monocytes Relative 12 %   Monocytes Absolute  1.0 0.1 - 1.0 K/uL   Eosinophils Relative 1 %   Eosinophils Absolute 0.1 0.0 - 0.5 K/uL   Basophils Relative 2 %   Basophils Absolute 0.1 0.0 - 0.1 K/uL   Immature Granulocytes 1 %   Abs Immature Granulocytes 0.04 0.00 - 0.07 K/uL  Comment: Performed at [facility], [address] [place], [place] [address]  Comprehensive metabolic panel     Status: Abnormal   Collection Time: 03/22/20 10:39 AM  Result Value Ref Range   Sodium 133 (L) 135 - 145 mmol/L   Potassium 4.2 3.5 - 5.1 mmol/L   Chloride 100 98 - 111 mmol/L   CO2 24 22 - 32 mmol/L   Glucose, Bld 103 (H) 70 - 99 mg/dL    Comment: Glucose reference range applies only to samples taken after fasting for at least 8 hours.   BUN 15 8 - 23 mg/dL   Creatinine, Ser 0.87 0.61 - 1.24 mg/dL   Calcium 9.2 8.9 - 10.3 mg/dL   Total Protein 6.4 (L) 6.5 - 8.1 g/dL   Albumin 3.5 3.5 - 5.0 g/dL   AST 11 (L) 15 - 41 U/L   ALT 11 0 - 44 U/L   Alkaline Phosphatase 44 38 - 126 U/L   Total Bilirubin 0.9 0.3 - 1.2 mg/dL   GFR, Estimated >60 >60 mL/min    Comment: (NOTE) Calculated using the CKD-EPI Creatinine Equation (2021)    Anion gap 9 5 - 15    Comment: Performed at [facility], [address], [place], [place] [address]  PSA     Status: None   Collection Time: 03/22/20 10:39 AM  Result Value Ref Range   Prostatic Specific Antigen 2.17 0.00 - 4.00 ng/mL    Comment: (NOTE) While PSA levels of <=4.0 ng/ml are reported as reference range, some men with levels below 4.0 ng/ml can have prostate cancer and many men with PSA above 4.0 ng/ml do not have prostate cancer.  Other tests such as free PSA, age specific reference ranges, PSA velocity and PSA doubling time may be helpful especially in men less than 29 years old. Performed at [facility], [place] [address] [place], [place] [address]   Estradiol     Status: Abnormal   Collection Time: 04/20/20  1:32 PM  Result Value Ref Range   Estradiol 77 (H) < OR = 39 pg/mL    Comment:  Reference range established on post-pubertal patient population. No pre-pubertal reference range established using this assay. For any patients for whom low Estradiol levels are anticipated (e.g. males, pre-pubertal children and hypogonadal/post-menopausal  females), the [employer] Estradiol, Ultrasensitive, LCMSMS assay is recommended (order code [phone]). . Please note: patients being treated with the drug  fulvestrant (Faslodex(R)) have demonstrated significant  interference in immunoassay methods for estradiol  measurement. The cross reactivity could lead to falsely  elevated estradiol test results leading to an  inappropriate clinical assessment of estrogen status. [facility] order code 30289-Estradiol,  Ultrasensitive LC/MS/MS demonstrates negligible cross  reactivity with fulvestrant.   COMPLETE METABOLIC PANEL WITH GFR     Status: Abnormal   Collection Time: 04/20/20  1:32 PM  Result Value Ref Range   Glucose, Bld 90 65 - 139 mg/dL    Comment: .        Non-fasting reference interval .    BUN 15 7 - 25 mg/dL   Creat 0.84 0.70 - 1.25 mg/dL    Comment: For patients >22 years of age, the reference limit for Creatinine is approximately 13% higher for people identified as African-American. .    GFR, Est Non African American 94 > OR = 60 mL/min/1.65m2   GFR, Est African American 109 > OR = 60 mL/min/1.6m2   BUN/Creatinine Ratio NOT APPLICABLE 6 -  22 (calc)   Sodium 136 135 - 146 mmol/L   Potassium 4.5 3.5 - 5.3 mmol/L   Chloride 104 98 - 110 mmol/L   CO2 24 20 - 32 mmol/L   Calcium 9.2 8.6 - 10.3 mg/dL   Total Protein 6.0 (L) 6.1 - 8.1 g/dL   Albumin 4.0 3.6 - 5.1 g/dL   Globulin 2.0 1.9 - 3.7 g/dL (calc)   AG Ratio 2.0 1.0 - 2.5 (calc)   Total Bilirubin 0.6 0.2 - 1.2 mg/dL   Alkaline phosphatase (APISO) 51 35 - 144 U/L   AST 10 10 - 35 U/L   ALT 7 (L) 9 - 46 U/L  Lipid panel     Status: Abnormal   Collection Time: 04/20/20  1:32 PM   Result Value Ref Range   Cholesterol 222 (H) <200 mg/dL   HDL 43 > OR = 40 mg/dL   Triglycerides 177 (H) <150 mg/dL   LDL Cholesterol (Calc) 148 (H) mg/dL (calc)    Comment: Reference range: <100 . Desirable range <100 mg/dL for primary prevention;   <70 mg/dL for patients with CHD or diabetic patients  with > or = 2 CHD risk factors. Marland Kitchen LDL-C is now calculated using the Martin-Hopkins  calculation, which is a validated novel method providing  better accuracy than the Friedewald equation in the  estimation of LDL-C.  Cresenciano Genre et al. Annamaria Helling. 5364;680(32): 2061-2068  (http://education.[facility].com/faq/FAQ164)    Total CHOL/HDL Ratio 5.2 (H) <5.0 (calc)   Non-HDL Cholesterol (Calc) 179 (H) <130 mg/dL (calc)    Comment: For patients with diabetes plus 1 major ASCVD risk  factor, treating to a non-HDL-C goal of <100 mg/dL  (LDL-C of <70 mg/dL) is considered a therapeutic  option.   T3, free     Status: None   Collection Time: 04/20/20  1:32 PM  Result Value Ref Range   T3, Free 4.0 2.3 - 4.2 pg/mL  TSH     Status: None   Collection Time: 04/20/20  1:32 PM  Result Value Ref Range   TSH 2.18 0.40 - 4.50 mIU/L  Iron,Total/Total Iron Binding Cap     Status: None   Collection Time: 04/20/20  1:32 PM  Result Value Ref Range   Iron 88 50 - 180 mcg/dL   TIBC 280 250 - 425 mcg/dL (calc)   %SAT 31 20 - 48 % (calc)  Ferritin     Status: None   Collection Time: 04/20/20  1:32 PM  Result Value Ref Range   Ferritin 45 24 - 380 ng/mL  CBC with Differential     Status: Abnormal   Collection Time: 04/28/20  1:18 PM  Result Value Ref Range   WBC 5.9 4.0 - 10.5 K/uL   RBC 3.97 (L) 4.22 - 5.81 MIL/uL   Hemoglobin 12.2 (L) 13.0 - 17.0 g/dL   HCT 36.4 (L) 39.0 - 52.0 %   MCV 91.7 80.0 - 100.0 fL   MCH 30.7 26.0 - 34.0 pg   MCHC 33.5 30.0 - 36.0 g/dL   RDW 11.9 11.5 - 15.5 %   Platelets 249 150 - 400 K/uL   nRBC 0.0 0.0 - 0.2 %   Neutrophils Relative % 68 %   Neutro Abs 4.0 1.7  - 7.7 K/uL   Lymphocytes Relative 21 %   Lymphs Abs 1.2 0.7 - 4.0 K/uL   Monocytes Relative 9 %   Monocytes Absolute 0.5 0.1 - 1.0 K/uL   Eosinophils Relative 1 %   Eosinophils  Absolute 0.1 0.0 - 0.5 K/uL   Basophils Relative 1 %   Basophils Absolute 0.1 0.0 - 0.1 K/uL   Immature Granulocytes 0 %   Abs Immature Granulocytes 0.02 0.00 - 0.07 K/uL    Comment: Performed at [facility], [address] [place], [place] [address]  Comprehensive metabolic panel     Status: Abnormal   Collection Time: 04/28/20  1:18 PM  Result Value Ref Range   Sodium 131 (L) 135 - 145 mmol/L   Potassium 4.0 3.5 - 5.1 mmol/L   Chloride 103 98 - 111 mmol/L   CO2 22 22 - 32 mmol/L   Glucose, Bld 102 (H) 70 - 99 mg/dL    Comment: Glucose reference range applies only to samples taken after fasting for at least 8 hours.   BUN 18 8 - 23 mg/dL   Creatinine, Ser 0.80 0.61 - 1.24 mg/dL   Calcium 8.7 (L) 8.9 - 10.3 mg/dL   Total Protein 6.5 6.5 - 8.1 g/dL   Albumin 3.8 3.5 - 5.0 g/dL   AST 13 (L) 15 - 41 U/L   ALT 10 0 - 44 U/L   Alkaline Phosphatase 49 38 - 126 U/L   Total Bilirubin 0.6 0.3 - 1.2 mg/dL   GFR, Estimated >60 >60 mL/min    Comment: (NOTE) Calculated using the CKD-EPI Creatinine Equation (2021)    Anion gap 6 5 - 15    Comment: Performed at [facility], [address], [place], [place] [address]  PSA     Status: Abnormal   Collection Time: 04/28/20  1:18 PM  Result Value Ref Range   Prostatic Specific Antigen 4.77 (H) 0.00 - 4.00 ng/mL    Comment: (NOTE) While PSA levels of <=4.0 ng/ml are reported as reference range, some men with levels below 4.0 ng/ml can have prostate cancer and many men with PSA above 4.0 ng/ml do not have prostate cancer.  Other tests such as free PSA, age specific reference ranges, PSA velocity and PSA doubling time may be helpful especially in men less than 73 years old. Performed at [facility], [place] [address], [place], [place] [address]   Testosterone      Status: Abnormal   Collection Time: 05/03/20 12:21 PM  Result Value Ref Range   Testosterone <3 (L) 264 - 916 ng/dL    Comment: (NOTE) Adult male reference interval is based on a population of healthy nonobese males (BMI <30) between 31 and 69 years old. [place], [place] [phone]. PMID: [id number]. Performed At: [facility] [place], [place] [id number] Rush Farmer MD YD:[id number]   Estradiol     Status: None   Collection Time: 05/26/20  4:21 PM  Result Value Ref Range   Estradiol 42.3 7.6 - 42.6 pg/mL    Comment: Roche ECLIA methodology  Testosterone     Status: Abnormal   Collection Time: 05/26/20  4:21 PM  Result Value Ref Range   Testosterone <3 (L) 264 - 916 ng/dL    Comment: Adult male reference interval is based on a population of healthy nonobese males (BMI <30) between 43 and 34 years old. [place], [place] [phone]. PMID: [id number].   PSA     Status: Abnormal   Collection Time: 05/26/20  4:21 PM  Result Value Ref Range   Prostate Specific Ag, Serum 12.9 (H) 0.0 - 4.0 ng/mL    Comment: Roche ECLIA methodology. According to the American Urological Association, Serum PSA should decrease and remain  at undetectable levels after radical prostatectomy. The AUA defines biochemical recurrence as an initial PSA value 0.2 ng/mL or greater followed by a subsequent confirmatory PSA value 0.2 ng/mL or greater. Values obtained with different assay methods or kits cannot be used interchangeably. Results cannot be interpreted as absolute evidence of the presence or absence of malignant disease.   Urinalysis, Routine w reflex microscopic     Status: Abnormal   Collection Time: 06/02/20 10:09 AM  Result Value Ref Range   Specific Gravity, UA 1.025 1.005 - 1.030   pH, UA 5.5 5.0 - 7.5   Color, UA Yellow Yellow   Appearance Ur Clear Clear   Leukocytes,UA Trace (A) Negative   Protein,UA Negative Negative/Trace   Glucose, UA  Negative Negative   Ketones, UA Negative Negative   RBC, UA Trace (A) Negative   Bilirubin, UA Negative Negative   Urobilinogen, Ur 0.2 0.2 - 1.0 mg/dL   Nitrite, UA Negative Negative   Microscopic Examination See below:   Microscopic Examination     Status: Abnormal   Collection Time: 06/02/20 10:09 AM   Urine  Result Value Ref Range   WBC, UA 6-10 (A) 0 - 5 /hpf   RBC 0-2 0 - 2 /hpf   Epithelial Cells (non renal) 0-10 0 - 10 /hpf   Renal Epithel, UA None seen None seen /hpf   Bacteria, UA Few (A) None seen/Few   I reviewed the records from his visits with Dr. Delton Coombes and his CT report.   Assessment & Plan: He has CRCP with locally advanced disease with left ureteral obstruction with AKI and he also has bone and lymph node metastases.   He is doing well s/p TURP with resection of the orifices and his Cr. remains down and the hydro was improved on his last scan.  His PSA is rising rapidly off of chemotherapy and his is having increased back pain.  He is scheduled for f/u with Dr. Delton Coombes in about 2 weeks so I will go ahead and order a bone scan and CT.  He may need to be considered for Xofigo or SBRT if the bone mets have worsened.   He will probably need to add a second line ARB or Abiraterone but I will defer that to Dr. Delton Coombes.   Continue Lupron 45mg  q57mo and dutasteride.   The estradiol is managed by Dr. Anastasio Champion.     Meds ordered this encounter  Medications  . DISCONTD: Leuprolide Acetate (6 Month) (LUPRON) injection 45 mg  . leuprolide (6 Month) (ELIGARD) injection 45 mg     Orders Placed This Encounter  Procedures  . Microscopic Examination  . CT ABDOMEN PELVIS W CONTRAST    Standing Status:   Future    Standing Expiration Date:   07/03/2020    Order Specific Question:   If indicated for the ordered procedure, I authorize the administration of contrast media per Radiology protocol    Answer:   Yes    Order Specific Question:   Preferred imaging location?     Answer:   [facility]    Order Specific Question:   Is Oral Contrast requested for this exam?    Answer:   No oral contrast    Order Specific Question:   Reason for No Oral Contrast    Answer:   Other    Order Specific Question:   Please answer why no oral contrast is requested    Answer:   not needed  Order Specific Question:   Radiology Contrast Protocol - do NOT remove file path    Answer:   [email or web address]  . NM Bone Scan Whole Body    Standing Status:   Future    Standing Expiration Date:   07/03/2020    Order Specific Question:   If indicated for the ordered procedure, I authorize the administration of a radiopharmaceutical per Radiology protocol    Answer:   Yes    Order Specific Question:   Preferred imaging location?    Answer:   [facility]    Order Specific Question:   Radiology Contrast Protocol - do NOT remove file path    Answer:   [email or web address]  . Urinalysis, Routine w reflex microscopic  . PSA    Standing Status:   Future    Standing Expiration Date:   06/02/2021  . Testosterone    Standing Status:   Future    Standing Expiration Date:   06/02/2021      Return in about 6 months (around 12/03/2020) for lupron.   CC: Doree Albee, MD, Dr. [name].     Irine Seal 06/02/2020

## 2020-06-02 ENCOUNTER — Ambulatory Visit (INDEPENDENT_AMBULATORY_CARE_PROVIDER_SITE_OTHER): Payer: BC Managed Care – PPO | Admitting: Urology

## 2020-06-02 ENCOUNTER — Other Ambulatory Visit: Payer: Self-pay

## 2020-06-02 ENCOUNTER — Telehealth: Payer: Self-pay | Admitting: *Deleted

## 2020-06-02 ENCOUNTER — Encounter: Payer: Self-pay | Admitting: Urology

## 2020-06-02 VITALS — BP 157/80 | HR 76 | Temp 98.3°F | Ht 66.0 in | Wt 177.0 lb

## 2020-06-02 DIAGNOSIS — R9721 Rising PSA following treatment for malignant neoplasm of prostate: Secondary | ICD-10-CM

## 2020-06-02 DIAGNOSIS — C61 Malignant neoplasm of prostate: Secondary | ICD-10-CM

## 2020-06-02 LAB — URINALYSIS, ROUTINE W REFLEX MICROSCOPIC
Bilirubin, UA: NEGATIVE
Glucose, UA: NEGATIVE
Ketones, UA: NEGATIVE
Nitrite, UA: NEGATIVE
Protein,UA: NEGATIVE
Specific Gravity, UA: 1.025 (ref 1.005–1.030)
Urobilinogen, Ur: 0.2 mg/dL (ref 0.2–1.0)
pH, UA: 5.5 (ref 5.0–7.5)

## 2020-06-02 LAB — MICROSCOPIC EXAMINATION: Renal Epithel, UA: NONE SEEN /hpf

## 2020-06-02 MED ORDER — LEUPROLIDE ACETATE (6 MONTH) 45 MG IM KIT
45.0000 mg | PACK | Freq: Once | INTRAMUSCULAR | Status: DC
Start: 2020-06-02 — End: 2020-06-02

## 2020-06-02 MED ORDER — LEUPROLIDE ACETATE (6 MONTH) 45 MG ~~LOC~~ KIT
45.0000 mg | PACK | Freq: Once | SUBCUTANEOUS | Status: AC
Start: 2020-06-02 — End: 2020-06-02
  Administered 2020-06-02: 45 mg via SUBCUTANEOUS

## 2020-06-02 NOTE — Addendum Note (Signed)
Addended byIris Pert on: 06/02/2020 04:07 PM   Modules accepted: Orders

## 2020-06-02 NOTE — Progress Notes (Signed)
Urological Symptom Review  Patient is experiencing the following symptoms: Get up at night to urinate Erection problems  Review of Systems  Gastrointestinal (upper)  : Negative for upper GI symptoms  Gastrointestinal (lower) : Negative for lower GI symptoms  Constitutional : Negative for symptoms  Skin: Negative for skin symptoms  Eyes: Negative for eye symptoms  Ear/Nose/Throat : Negative for Ear/Nose/Throat symptoms  Hematologic/Lymphatic: Negative for Hematologic/Lymphatic symptoms  Cardiovascular : Negative for cardiovascular symptoms  Respiratory : Shortness of breath  Endocrine: Negative for endocrine symptoms  Musculoskeletal: Back pain Joint pain  Neurological: Negative for neurological symptoms  Psychologic: Negative for psychiatric symptoms

## 2020-06-02 NOTE — Telephone Encounter (Signed)
Called pt to scheduled TCS/EGD +/-DIL with propofol, Dr. Gala Romney, ASA 3. He states he discussed with his spouse and he no longer wants to TCS done now. He just wants the EGD +/-DIL done. Patient scheduled for upper 4/21. Advised will inform Cyril Mourning patient wants to hold off on TCS for now.

## 2020-06-02 NOTE — Telephone Encounter (Signed)
Noted  

## 2020-06-03 ENCOUNTER — Ambulatory Visit: Payer: BC Managed Care – PPO | Admitting: Urology

## 2020-06-03 ENCOUNTER — Other Ambulatory Visit: Payer: Self-pay

## 2020-06-03 ENCOUNTER — Encounter: Payer: Self-pay | Admitting: *Deleted

## 2020-06-03 ENCOUNTER — Ambulatory Visit (HOSPITAL_COMMUNITY)
Admission: RE | Admit: 2020-06-03 | Discharge: 2020-06-03 | Disposition: A | Discharge status: Home / Self Care | Payer: BC Managed Care – PPO | Source: Ambulatory Visit | Attending: Urology | Admitting: Urology

## 2020-06-03 DIAGNOSIS — C61 Malignant neoplasm of prostate: Secondary | ICD-10-CM | POA: Insufficient documentation

## 2020-06-03 DIAGNOSIS — R9721 Rising PSA following treatment for malignant neoplasm of prostate: Secondary | ICD-10-CM | POA: Insufficient documentation

## 2020-06-03 LAB — POCT I-STAT CREATININE: Creatinine, Ser: 0.8 mg/dL (ref 0.61–1.24)

## 2020-06-03 MED ORDER — IOHEXOL 300 MG/ML  SOLN
100.0000 mL | Freq: Once | INTRAMUSCULAR | Status: AC | PRN
  Administered 2020-06-03: 100 mL via INTRAVENOUS

## 2020-06-06 ENCOUNTER — Other Ambulatory Visit: Payer: Self-pay

## 2020-06-06 ENCOUNTER — Telehealth: Payer: Self-pay

## 2020-06-06 DIAGNOSIS — C61 Malignant neoplasm of prostate: Secondary | ICD-10-CM

## 2020-06-06 NOTE — Telephone Encounter (Signed)
-----   Message from Irine Seal, MD sent at 06/06/2020 10:18 AM EDT ----- CT shows some progression of the lumbar bone mets and lymph nodes. Bone scan is scheduled for 3/29. Since he is experiencing back pain, I would like to get him set up to see Dr. Tyler Pita in radiation oncology for consideration of treatment of the bone mets for pain control.   He is to see Dr. Delton Coombes on 06/16/20 and should keep that appointment.

## 2020-06-06 NOTE — Telephone Encounter (Signed)
Patient called and made aware. Referral sent.

## 2020-06-06 NOTE — Progress Notes (Signed)
CT shows some progression of the lumbar bone mets and lymph nodes. Bone scan is scheduled for 3/29. Since he is experiencing back pain, I would like to get him set up to see Dr. Tyler Pita in radiation oncology for consideration of treatment of the bone mets for pain control.   He is to see Dr. Delton Coombes on 06/16/20 and should keep that appointment.

## 2020-06-07 NOTE — Addendum Note (Signed)
Addended by: Dorisann Frames on: 06/07/2020 09:27 AM   Modules accepted: Orders

## 2020-06-14 ENCOUNTER — Encounter (HOSPITAL_COMMUNITY)
Admission: RE | Admit: 2020-06-14 | Discharge: 2020-06-14 | Disposition: A | Discharge status: Home / Self Care | Payer: BC Managed Care – PPO | Source: Ambulatory Visit | Attending: Urology | Admitting: Urology

## 2020-06-14 ENCOUNTER — Encounter (HOSPITAL_COMMUNITY): Payer: Self-pay

## 2020-06-14 ENCOUNTER — Ambulatory Visit (HOSPITAL_COMMUNITY)
Admission: RE | Admit: 2020-06-14 | Discharge: 2020-06-14 | Disposition: A | Discharge status: Home / Self Care | Payer: BC Managed Care – PPO | Source: Ambulatory Visit | Attending: Urology | Admitting: Urology

## 2020-06-14 DIAGNOSIS — R9721 Rising PSA following treatment for malignant neoplasm of prostate: Secondary | ICD-10-CM | POA: Diagnosis present

## 2020-06-14 DIAGNOSIS — C61 Malignant neoplasm of prostate: Secondary | ICD-10-CM | POA: Diagnosis present

## 2020-06-14 MED ORDER — TECHNETIUM TC 99M MEDRONATE IV KIT
20.0000 | PACK | Freq: Once | INTRAVENOUS | Status: AC | PRN
  Administered 2020-06-14: 20.5 via INTRAVENOUS

## 2020-06-16 ENCOUNTER — Other Ambulatory Visit (HOSPITAL_COMMUNITY): Payer: Self-pay

## 2020-06-16 ENCOUNTER — Other Ambulatory Visit: Payer: Self-pay

## 2020-06-16 ENCOUNTER — Other Ambulatory Visit (HOSPITAL_COMMUNITY): Payer: Self-pay | Admitting: Hematology

## 2020-06-16 ENCOUNTER — Encounter (HOSPITAL_COMMUNITY): Payer: Self-pay | Admitting: Pharmacy Technician

## 2020-06-16 ENCOUNTER — Telehealth (HOSPITAL_COMMUNITY): Payer: Self-pay | Admitting: Pharmacy Technician

## 2020-06-16 ENCOUNTER — Inpatient Hospital Stay (HOSPITAL_COMMUNITY): Payer: BC Managed Care – PPO | Attending: Hematology

## 2020-06-16 ENCOUNTER — Inpatient Hospital Stay (HOSPITAL_COMMUNITY): Payer: BC Managed Care – PPO | Attending: Hematology | Admitting: Hematology

## 2020-06-16 VITALS — BP 160/90 | HR 81 | Temp 98.3°F | Resp 16 | Wt 182.0 lb

## 2020-06-16 DIAGNOSIS — E039 Hypothyroidism, unspecified: Secondary | ICD-10-CM | POA: Insufficient documentation

## 2020-06-16 DIAGNOSIS — R5383 Other fatigue: Secondary | ICD-10-CM | POA: Insufficient documentation

## 2020-06-16 DIAGNOSIS — I700 Atherosclerosis of aorta: Secondary | ICD-10-CM | POA: Insufficient documentation

## 2020-06-16 DIAGNOSIS — K4090 Unilateral inguinal hernia, without obstruction or gangrene, not specified as recurrent: Secondary | ICD-10-CM | POA: Insufficient documentation

## 2020-06-16 DIAGNOSIS — C779 Secondary and unspecified malignant neoplasm of lymph node, unspecified: Secondary | ICD-10-CM | POA: Insufficient documentation

## 2020-06-16 DIAGNOSIS — C61 Malignant neoplasm of prostate: Secondary | ICD-10-CM | POA: Insufficient documentation

## 2020-06-16 DIAGNOSIS — G893 Neoplasm related pain (acute) (chronic): Secondary | ICD-10-CM | POA: Diagnosis not present

## 2020-06-16 DIAGNOSIS — Z8042 Family history of malignant neoplasm of prostate: Secondary | ICD-10-CM | POA: Diagnosis not present

## 2020-06-16 DIAGNOSIS — Z192 Hormone resistant malignancy status: Secondary | ICD-10-CM | POA: Diagnosis not present

## 2020-06-16 DIAGNOSIS — R630 Anorexia: Secondary | ICD-10-CM | POA: Insufficient documentation

## 2020-06-16 DIAGNOSIS — N1330 Unspecified hydronephrosis: Secondary | ICD-10-CM | POA: Diagnosis not present

## 2020-06-16 DIAGNOSIS — C7951 Secondary malignant neoplasm of bone: Secondary | ICD-10-CM | POA: Insufficient documentation

## 2020-06-16 DIAGNOSIS — Z85828 Personal history of other malignant neoplasm of skin: Secondary | ICD-10-CM | POA: Insufficient documentation

## 2020-06-16 DIAGNOSIS — E559 Vitamin D deficiency, unspecified: Secondary | ICD-10-CM | POA: Insufficient documentation

## 2020-06-16 DIAGNOSIS — Z87891 Personal history of nicotine dependence: Secondary | ICD-10-CM | POA: Diagnosis not present

## 2020-06-16 DIAGNOSIS — K429 Umbilical hernia without obstruction or gangrene: Secondary | ICD-10-CM | POA: Diagnosis not present

## 2020-06-16 DIAGNOSIS — G4700 Insomnia, unspecified: Secondary | ICD-10-CM | POA: Diagnosis not present

## 2020-06-16 DIAGNOSIS — R9721 Rising PSA following treatment for malignant neoplasm of prostate: Secondary | ICD-10-CM | POA: Insufficient documentation

## 2020-06-16 DIAGNOSIS — G629 Polyneuropathy, unspecified: Secondary | ICD-10-CM | POA: Diagnosis not present

## 2020-06-16 LAB — PSA: Prostatic Specific Antigen: 18.81 ng/mL — ABNORMAL HIGH (ref 0.00–4.00)

## 2020-06-16 MED ORDER — PREDNISONE 5 MG PO TABS: 5.0000 mg | ORAL_TABLET | Freq: Every day | ORAL | 6 refills | Status: DC

## 2020-06-16 MED ORDER — TRAMADOL HCL 50 MG PO TABS: 50.0000 mg | ORAL_TABLET | Freq: Two times a day (BID) | ORAL | 0 refills | Status: DC | PRN

## 2020-06-16 MED ORDER — ABIRATERONE ACETATE 250 MG PO TABS: 1000.0000 mg | ORAL_TABLET | Freq: Every day | ORAL | 0 refills | Status: DC

## 2020-06-16 NOTE — Progress Notes (Signed)
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 TURP on 11/10/2019. 2. Docetaxel x 6 cycles from 11/16/2019 to 02/29/2020.  NGS Results: Not done  CURRENT THERAPY: Surveillance  BRIEF ONCOLOGIC HISTORY:  Oncology History  Prostate cancer metastatic to multiple sites [facility])  01/25/2017 Initial Diagnosis   Prostate cancer metastatic to multiple sites [facility])   11/16/2019 -  Chemotherapy   The patient had ondansetron (ZOFRAN) injection 8 mg, 8 mg (100 % of original dose 8 mg), Intravenous,  Once, 6 of 6 cycles Dose modification: 8 mg (original dose 8 mg, Cycle 1) pegfilgrastim-jmdb (FULPHILA) injection 6 mg, 6 mg, Subcutaneous,  Once, 6 of 6 cycles Administration: 6 mg (11/18/2019), 6 mg (12/09/2019), 6 mg (12/30/2019), 6 mg (01/20/2020), 6 mg (02/10/2020) ondansetron (ZOFRAN) 8 mg in sodium chloride 0.9 % 50 mL IVPB, , Intravenous,  Once, 4 of 4 cycles Administration: 8 mg (12/28/2019), 8 mg (01/18/2020), 8 mg (02/29/2020) DOCEtaxel (TAXOTERE) 140 mg in sodium chloride 0.9 % 250 mL chemo infusion, 75 mg/m2 = 140 mg, Intravenous,  Once, 6 of 6 cycles Administration: 140 mg (11/16/2019), 140 mg (12/07/2019), 140 mg (12/28/2019), 140 mg (01/18/2020), 110 mg (02/08/2020), 110 mg (02/29/2020)  for chemotherapy treatment.     Genetic Testing   Negative genetic testing. No pathogenic variants identified on the [facility] CancerNext-Expanded panel. 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 The report date is 04/19/2020.  The CancerNext-Expanded  gene panel offered by [facility] and includes sequencing and rearrangement analysis for the following 77 genes: IP, ALK, APC*, ATM*, AXIN2, BAP1, BARD1, BLM, BMPR1A, BRCA1*, BRCA2*, BRIP1*, CDC73, CDH1*,CDK4,  CDKN1B, CDKN2A, CHEK2*, CTNNA1, DICER1, FANCC, FH, FLCN, GALNT12, KIF1B, LZTR1, MAX, MEN1, MET, MLH1*, MSH2*, MSH3, MSH6*, MUTYH*, NBN, NF1*, NF2, NTHL1, PALB2*, PHOX2B, PMS2*, POT1, PRKAR1A, PTCH1, PTEN*, RAD51C*, RAD51D*,RB1, RECQL, RET, SDHA, SDHAF2, SDHB, SDHC, SDHD, SMAD4, SMARCA4, SMARCB1, SMARCE1, STK11, SUFU, TMEM127, TP53*,TSC1, TSC2, VHL and XRCC2 (sequencing and deletion/duplication); EGFR, EGLN1, HOXB13, KIT, MITF, PDGFRA, POLD1 and POLE (sequencing only); EPCAM and GREM1 (deletion/duplication only).      CANCER STAGING: Cancer Staging No matching staging information was found for the patient.  INTERVAL HISTORY:  [name], a 65 y.o. 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 Zoltan was last seen on 05/03/2020.   Today he is accompanied by his wife and he reports feeling okay. He complains of having pain in his lower mid-back, hips and left thigh. He also notes having sciatic nerve pain and did exercises which improved the pain, and then he developed the current pain which is new. The pain interferes with his sleep and he takes occasional ibuprofen. He reports that he developed hot itching whelts after having his CT scan on 03/18; he applied topical Benadryl and it resolved after several hours. He denies having history of cardiac issues or MI's.  He saw Dr. Jeffie Pollock on 03/17 and received his Eligard during the visit. He is open to trying Zytiga to see how he responds. He is scheduled to have his EGD with Dr. Gala Romney on 04/21.   REVIEW OF SYSTEMS:  Review of Systems  Constitutional: Positive for appetite change (50%) and fatigue (50%).  Musculoskeletal: Positive for arthralgias (bilat hips pain), back pain (  3/10 lower mid-back pain) and myalgias (L thigh pain).  Psychiatric/Behavioral: Positive for sleep disturbance (d/t back pain).  All other systems reviewed and are negative.   PAST MEDICAL/SURGICAL HISTORY:  Past  Medical History:  Diagnosis Date  . Bladder obstruction   . Diverticulitis 2015  . Dyspnea   . Essential hypertension, benign 01/26/2019   at [place] office elevated, normal at home, no medications at this time  . History of basal cell cancer   . HLD (hyperlipidemia) 01/26/2019  . Hypothyroidism    history of, not currently taking medication  . Prostate cancer ([place])    metastatic to bones  . Vitamin D deficiency disease 01/26/2019   Past Surgical History:  Procedure Laterality Date  . CYSTOSCOPY W/ URETERAL STENT PLACEMENT Left 11/10/2019   Procedure: CYSTOSCOPY;  Surgeon: Irine Seal, MD;  Location: [facility];  Service: Urology;  Laterality: Left;  . HERNIA REPAIR  [id number]   UMBILICAL   . PROSTATE SURGERY    . SKIN CANCER EXCISION    . TONSILLECTOMY Bilateral   . TRANSURETHRAL RESECTION OF PROSTATE N/A 01/25/2017   Procedure: TRANSURETHRAL RESECTION OF THE PROSTATE (TURP);  Surgeon: Alexis Frock, MD;  Location: [facility];  Service: Urology;  Laterality: N/A;  . TRANSURETHRAL RESECTION OF PROSTATE N/A 11/10/2019   Procedure: TRANSURETHRAL RESECTION OF THE PROSTATE (TURP);  Surgeon: Irine Seal, MD;  Location: [facility];  Service: Urology;  Laterality: N/A;  . WISDOM TOOTH EXTRACTION      SOCIAL HISTORY:  Social History   Socioeconomic History  . Marital status: Married    Spouse name: Not on file  . Number of children: 1  . Years of education: Not on file  . Highest education level: Not on file  Occupational History    Comment: working full time  Tobacco Use  . Smoking status: Former Smoker    Packs/day: 1.50    Years: 35.00    Pack years: 52.50    Types: Cigarettes    Quit date: 01/12/2004    Years since quitting: 16.4  . Smokeless tobacco: Never Used  . Tobacco comment: OVER 30 YEARS HX OF SMOKING   Vaping Use  . Vaping Use: Never used  Substance and Sexual Activity  . Alcohol use: Not Currently    Comment: heavy drinker until 2004  . Drug use: Not Currently    Types:  Marijuana  . Sexual activity: Not Currently  Other Topics Concern  . Not on file  Social History Narrative   Married for 19 years.Works for [employer] from home.   Social Determinants of Health   Financial Resource Strain: Low Risk   . Difficulty of Paying Living Expenses: Not hard at all  Food Insecurity: No Food Insecurity  . Worried About Charity fundraiser in the Last Year: Never true  . Ran Out of Food in the Last Year: Never true  Transportation Needs: No Transportation Needs  . Lack of Transportation (Medical): No  . Lack of Transportation (Non-Medical): No  Physical Activity: Inactive  . Days of Exercise per Week: 0 days  . Minutes of Exercise per Session: 0 min  Stress: No Stress Concern Present  . Feeling of Stress : Only a little  Social Connections: Moderately Integrated  . Frequency of Communication with Friends and Family: Three times a week  . Frequency of Social Gatherings with Friends and Family: Never  . Attends Religious Services: Never  . Active Member of Clubs or Organizations: Yes  . Attends Club  or Organization Meetings: 1 to 4 times per year  . Marital Status: Married  Human resources officer Violence: Not At Risk  . Fear of Current or Ex-Partner: No  . Emotionally Abused: No  . Physically Abused: No  . Sexually Abused: No    FAMILY HISTORY:  Family History  Problem Relation Age of Onset  . Emphysema Mother   . Alzheimer's disease Father   . Prostate cancer Father        dx in his 34s  . Prostate cancer Brother        dx in his 11s  . Breast cancer Maternal Grandmother        49s  . Pancreatic cancer Maternal Grandfather        21s  . Dementia Paternal Grandmother   . Heart attack Paternal Grandfather   . Colon cancer Neg Hx     CURRENT MEDICATIONS:  Current Outpatient Medications  Medication Sig Dispense Refill  . abiraterone acetate (ZYTIGA) 250 MG tablet Take 4 tablets (1,000 mg total) by mouth daily. Take on an empty stomach 1 hour  before or 2 hours after a meal 120 tablet 0  . arginine 500 MG tablet Take 500 mg by mouth 2 (two) times daily.    . ASTRAGALUS PO Take 1 tablet by mouth 2 (two) times daily.    . Cholecalciferol 125 MCG (5000 UT) capsule Take 5,000-10,000 Units by mouth See admin instructions. Take 10000 units in the morning and 5000 units at night    . cimetidine (TAGAMET) 200 MG tablet Take 200-400 mg by mouth daily as needed (inflammation).     . DANDELION ROOT PO Take 500 mg by mouth daily.    Marland Kitchen dutasteride (AVODART) 0.5 MG capsule Take 1 capsule (0.5 mg total) by mouth daily. 30 capsule 11  . estradiol (ESTRACE) 2 MG tablet Take 2 tablets (4 mg total) by mouth daily. (Patient taking differently: Take 2 mg by mouth daily.) 60 tablet 3  . ibuprofen (ADVIL) 200 MG tablet Take 400 mg by mouth every 6 (six) hours as needed for moderate pain.    . IRON HEME POLYPEPTIDE PO Take by mouth.    . Leuprolide Acetate, 6 Month, (LUPRON DEPOT, 20-MONTH,) 45 MG injection Inject 45 mg into the muscle every 6 (six) months.    Marland Kitchen MAGNESIUM PO Take 1 tablet by mouth at bedtime.    Marland Kitchen MELATONIN PO Take 60 mg by mouth at bedtime.    Marland Kitchen MILK THISTLE PO Take 1 capsule by mouth daily.    . Misc Natural Products (TURMERIC CURCUMIN) CAPS Take by mouth 2 (two) times daily. 400    . NATTOKINASE PO Take 2 capsules by mouth daily.    . NP THYROID 90 MG tablet Take 1 tablet (90 mg total) by mouth daily. 30 tablet 3  . OVER THE COUNTER MEDICATION Take 1 capsule by mouth 2 (two) times daily. [place] Mush[employer]  Anti-Cancer /Immune Scientist, product/process development    . OVER THE COUNTER MEDICATION Place 1 Dose under the tongue 2 (two) times daily. CBD oil daily    . OVER THE COUNTER MEDICATION Take 1 capsule by mouth daily. Gamma E otc supplement    . OVER THE COUNTER MEDICATION Take 1 capsule by mouth 2 (two) times daily. zyflamend otc supplement    . predniSONE (DELTASONE) 5 MG tablet Take 1 tablet (5 mg total) by mouth daily with breakfast. 30 tablet 6  .  QUERCETIN PO Take 1 tablet by mouth 2 (two)  times daily.    Marland Kitchen VITAMIN K PO Take 1 capsule by mouth daily.    . Zinc 30 MG CAPS Take 30 mg by mouth daily.    . traMADol (ULTRAM) 50 MG tablet Take by mouth every 12 (twelve) hours as needed.     No current facility-administered medications for this visit.   Facility-Administered Medications Ordered in Other Visits  Medication Dose Route Frequency Provider Last Rate Last Admin  . heparin lock flush 100 unit/mL  500 Units Intracatheter Once PRN [name], MD      . sodium chloride flush (NS) 0.9 % injection 10 mL  10 mL Intracatheter PRN [name], MD        ALLERGIES:  Allergies  Allergen Reactions  . Ivp Dye [Iodinated Diagnostic Agents] Swelling    Swelling on head, took Benadryl and swelling reduced  . Wellbutrin [Bupropion] Hives    PHYSICAL EXAM:  Performance status (ECOG): 0 - Asymptomatic  Vitals:   06/16/20 1413  BP: (!) 160/90  Pulse: 81  Resp: 16  Temp: 98.3 F (36.8 C)  SpO2: 98%   Wt Readings from Last 3 Encounters:  06/16/20 182 lb (82.6 kg)  06/02/20 177 lb (80.3 kg)  05/30/20 184 lb 9.6 oz (83.7 kg)   Physical Exam Vitals reviewed.  Constitutional:      Appearance: Normal appearance.  Cardiovascular:     Rate and Rhythm: Normal rate and regular rhythm.     Pulses: Normal pulses.     Heart sounds: Normal heart sounds.  Pulmonary:     Effort: Pulmonary effort is normal.     Breath sounds: Normal breath sounds.  Musculoskeletal:     Right lower leg: No edema.     Left lower leg: No edema.  Neurological:     General: No focal deficit present.     Mental Status: He is alert and oriented to person, place, and time.  Psychiatric:        Mood and Affect: Mood normal.        Behavior: Behavior normal.      LABORATORY DATA:  I have reviewed the labs as listed.  CBC Latest Ref Rng & Units 04/28/2020 03/22/2020 02/29/2020  WBC 4.0 - 10.5 K/uL 5.9 8.8 4.8  Hemoglobin 13.0 - 17.0 g/dL  12.2(L) 11.5(L) 11.9(L)  Hematocrit 39.0 - 52.0 % 36.4(L) 33.7(L) 35.8(L)  Platelets 150 - 400 K/uL 249 265 315   CMP Latest Ref Rng & Units 06/03/2020 04/28/2020 04/20/2020  Glucose 70 - 99 mg/dL - 102(H) 90  BUN 8 - 23 mg/dL - 18 15  Creatinine 0.61 - 1.24 mg/dL 0.80 0.80 0.84  Sodium 135 - 145 mmol/L - 131(L) 136  Potassium 3.5 - 5.1 mmol/L - 4.0 4.5  Chloride 98 - 111 mmol/L - 103 104  CO2 22 - 32 mmol/L - 22 24  Calcium 8.9 - 10.3 mg/dL - 8.7(L) 9.2  Total Protein 6.5 - 8.1 g/dL - 6.5 6.0(L)  Total Bilirubin 0.3 - 1.2 mg/dL - 0.6 0.6  Alkaline Phos 38 - 126 U/L - 49 -  AST 15 - 41 U/L - 13(L) 10  ALT 0 - 44 U/L - 10 7(L)    DIAGNOSTIC IMAGING:  I have independently reviewed the scans and discussed with the patient. NM Bone Scan Whole Body  Result Date: 06/14/2020 CLINICAL DATA:  Prostate cancer, staging, lower back and bilateral hip pain EXAM: NUCLEAR MEDICINE WHOLE BODY BONE SCAN TECHNIQUE: Whole body anterior and posterior images were  obtained approximately 3 hours after intravenous injection of radiopharmaceutical. RADIOPHARMACEUTICALS:  20.5 mCi Technetium-32mMDP IV COMPARISON:  06/03/2020, 01/14/2020 FINDINGS: Anterior and posterior whole body planar images demonstrate physiologic excretion of radiotracer within the kidneys and bladder. The known metastatic focus within the left aspect of the L2 vertebral body is again identified, increased in size and intensity since prior study. A new focus of radiotracer activity is seen at the L1 level in the midline. These areas correspond to the regions of sclerosis on recent CT. No other abnormal areas of radiotracer deposition. Degenerative changes are seen within the bilateral shoulders. Activity about the left ankle likely reflects sequela of previous trauma, and is stable. IMPRESSION: 1. Progressive bony metastatic disease, with increased size and intensity of activity at L2 and a new focus of activity at L1. These areas correspond to  regions of sclerosis on preceding CT. Electronically Signed   By: MRanda NgoM.D.   On: 06/14/2020 22:22   CT ABDOMEN PELVIS W CONTRAST  Result Date: 06/03/2020 CLINICAL DATA:  Prostate cancer staging.  Rising PSA. EXAM: CT ABDOMEN AND PELVIS WITH CONTRAST TECHNIQUE: Multidetector CT imaging of the abdomen and pelvis was performed using the standard protocol following bolus administration of intravenous contrast. CONTRAST:  1062mOMNIPAQUE IOHEXOL 300 MG/ML  SOLN COMPARISON:  CT AP 01/14/2020 FINDINGS: Lower chest: No acute abnormality. Hepatobiliary: No focal liver abnormality is seen. No gallstones, gallbladder wall thickening, or biliary dilatation. Pancreas: Unremarkable. No pancreatic ductal dilatation or surrounding inflammatory changes. Spleen: Normal in size without focal abnormality. Adrenals/Urinary Tract: Normal appearance of the adrenal glands. No kidney mass or hydronephrosis identified bilaterally. Mild circumferential wall thickening of the urinary bladder. No focal suspicious bladder abnormality noted. Stomach/Bowel: Stomach appears normal. Normal appearance of the appendix. No bowel wall thickening, inflammation, or distension. Distal colonic diverticula noted without acute inflammation. Vascular/Lymphatic: Aortic atherosclerosis. Left retroperitoneal lymph node measures 9 mm, image 22/2. Previously 6 mm. Left external iliac node measures 8 mm, image 58/2. Previously 7 mm. No enlarged abdominopelvic lymph nodes identified which meet CT criteria for adenopathy. Reproductive: Changes consistent with TURP identified with nodular resection of the soft tissue near the bladder base. Symmetric appearance of the seminal vesicles. Other: No free fluid or fluid collections. There are 2 supraumbilical ventral abdominal wall midline hernias which contain fat only, image 35/2. Small fat containing umbilical hernia also noted. Left inguinal hernia contains fat only. Musculoskeletal: Progressive sclerosis  involving the L2 vertebral body, image 72/6. New area of sclerosis identified involving the anterior aspect of the L1 vertebral body, image 69/6. A few scattered punctate sclerotic foci are identified within the bony pelvis and appear unchanged. Nonspecific. IMPRESSION: 1. Left retroperitoneal and left external iliac lymph nodes are slightly increased in size from previous exam. Although these nodes do not meet CT criteria for adenopathy findings are suspicious and warrant attention on follow-up imaging. 2. Progressive sclerosis involving the L2 vertebral body with new area of sclerosis involving the anterior aspect of the L1 vertebral body. Suspicious for metastatic disease. 3. Aortic atherosclerosis. Aortic Atherosclerosis (ICD10-I70.0). Electronically Signed   By: TaKerby Moors.D.   On: 06/03/2020 18:17     ASSESSMENT:  1. 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 Prominent left hydronephrosis and hydroureter noted. New sclerotic lesion in the left anterior vertebral body of L1. Mild left periaortic  adenopathy increased from prior, short axis lymph node measuring 1 cm. -Last PSA increased to 15.8 on 09/28/2019. -As there is rapid progression of disease, I have recommended docetaxel chemotherapy. He is agreeable after long discussion. -Cystoscopy with transurethral resection of tumor in the posterior bladder neck, right and left walls of the bladder, right lobe of the prostate on 11/10/2019. -Plan was to do 6 cycles of docetaxel followed by abiraterone and prednisone. -6 cycles of docetaxel from 11/16/2019 through 02/29/2020. -CTAP on 01/06/2020 showed persistent thickening of the urinary bladder wall. Left hydronephrosis is significantly reduced compared to prior exam. Left-sided retroperitoneal and left iliac lymph nodes reduced in  size. Interval increase in sclerosis of lesions of the left aspect of L2 vertebral body consistent with treatment response. No new bone lesions. -Bone scan on 01/06/2020 shows stable L2 metastatic focus. -Bone scan scan images from 06/14/2020 which showed progressive metastatic disease, with increased size and intensity of L2 1 new focus of activity at L1. 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  2. Left hydroureteronephrosis: -CT scan showed prostate mass invading the posterior bladder and obstructing the ureter. Creatinine increased to 1.1. -Renal ultrasound on 12/11/2019 showed interval resolution of previously seen left-sided hydronephrosis. Previously seen soft tissue mass at the posterior bladder lumen no longer seen.   PLAN:  1. 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  PSA today is 18.81.  Testosterone is less than 3. -He received Eligard 45 mg on 06/02/2020. -We reviewed bone scan scan images from 06/14/2020 which showed progressive metastatic disease, with increased size and intensity of L2 1 new focus of activity at L1. -CTAP from 06/03/2020 showed left retroperitoneal and external iliac lymph node slightly increased in size, largest measuring 9 mm.  Progressive sclerosis of the L2 vertebral body with new area of sclerosis involving anterior aspect of the L1 vertebral body. -We have talked about starting him on Abiraterone 1000 mg daily along with prednisone 5 mg daily. -We discussed about the side effects in detail.  He gives [place] consent to proceed with starting Abiraterone. -He will take Zytiga 3 tablets during the first week and increase it to 4 tablets daily. -RTC 4 weeks for follow-up.   2.  Left hydroureteronephrosis: -This is completely resolved on the current scan.  3. Hot flashes: -He is taking estradiol 1 mg tablet, prescribed by Dr. Anastasio Champion.  4. Peripheral neuropathy: -Left foot neuropathy is stable.  Occasional  neuropathy in the fingertips.  5.  Low back pain: -He has left-sided back pain which is new.  This corresponds to L1 and L2 lesions. -I have given a prescription for tramadol 50 mg to be taken if there is any severe pain.   Orders placed this encounter:  Orders Placed This Encounter  Procedures  . CBC with Differential/Platelet  . Comprehensive metabolic panel     [name], MD [place] [phone]   I, Milinda Antis, am acting as a scribe for Dr. Sanda Linger.  I, [name] MD, have reviewed the above documentation for accuracy and completeness, and I agree with the above.

## 2020-06-16 NOTE — Patient Instructions (Signed)
[place] at [facility] Discharge Instructions  You were seen today by Dr. Delton Coombes. He went over your recent results and scans. You will be prescribed Zytiga to take 4 tablets on an empty stomach daily. You will also be prescribed prednisone 5 mg to take with breakfast. You will be prescribed tramadol 50 mg to take for your pain as needed. Dr. Delton Coombes will see you back in 1 month for labs and follow up.   Thank you for choosing [place] at [facility] to provide your oncology and hematology care.  To afford each patient quality time with our provider, please arrive at least 15 minutes before your scheduled appointment time.   If you have a lab appointment with the [facility] please come in thru the Main Entrance and check in at the main information desk  You need to re-schedule your appointment should you arrive 10 or more minutes late.  We strive to give you quality time with our providers, and arriving late affects you and other patients whose appointments are after yours.  Also, if you no show three or more times for appointments you may be dismissed from the clinic at the providers discretion.     Again, thank you for choosing [facility].  Our hope is that these requests will decrease the amount of time that you wait before being seen by our physicians.       _____________________________________________________________  Should you have questions after your visit to [facility], please contact our office at [phone] between the hours of 8:00 a.m. and 4:30 p.m.  Voicemails left after 4:00 p.m. will not be returned until the following business day.  For prescription refill requests, have your pharmacy contact our office and allow 72 hours.    Cancer Center Support Programs:   > Cancer Support Group  2nd Tuesday of the month 1pm-2pm, Journey Room

## 2020-06-16 NOTE — Telephone Encounter (Signed)
Oral Oncology Patient Advocate Encounter  Received notification from [place] that prior authorization for Abiraterone [employer] generic) is required.  PA submitted on CoverMyMeds Key [id number] Status is pending  Oral Oncology Clinic will continue to follow.  [place] Patient [place] Phone [phone] Fax [phone] 06/16/2020 3:44 PM

## 2020-06-17 ENCOUNTER — Other Ambulatory Visit (HOSPITAL_COMMUNITY): Payer: Self-pay | Admitting: Pharmacist

## 2020-06-17 DIAGNOSIS — C61 Malignant neoplasm of prostate: Secondary | ICD-10-CM

## 2020-06-17 MED ORDER — ABIRATERONE ACETATE 250 MG PO TABS: 1000.0000 mg | ORAL_TABLET | Freq: Every day | ORAL | 0 refills | Status: DC

## 2020-06-17 NOTE — Progress Notes (Signed)
Oral Oncology Pharmacist Encounter  Patient's insurance requires that Zytiga (abiraterone acetate) be filled through [facility]. Pharmacy updated in patient's chart and prescription redirected to [facility].  Leron Croak, PharmD, BCPS Hematology/Oncology Clinical Pharmacist [facility] [phone] 06/17/2020 3:21 PM

## 2020-06-17 NOTE — Telephone Encounter (Signed)
Oral Oncology Patient Advocate Encounter  Prior Authorization for Abiraterone has been approved.    PA# [id number] Effective dates: 06/17/20 through 06/17/21  Patient must use [facility] pharmacy.  Oral Oncology Clinic will continue to follow.   [place] Patient [place] Phone [phone] Fax [phone] 06/17/2020 3:14 PM

## 2020-06-20 ENCOUNTER — Telehealth: Payer: Self-pay

## 2020-06-20 NOTE — Telephone Encounter (Signed)
Patient notified of results  Per pt, he has not heard from Dr. Johny Shears office yet on referral.  Message sent to our referral coordinator, Caryl Pina.

## 2020-06-20 NOTE — Telephone Encounter (Signed)
Oral Oncology Patient Advocate Encounter  Prior Authorization for Abiraterone has been approved.    PA# [id number] Effective dates: 06/17/20 through 06/17/21  Oral Oncology Clinic will continue to follow.   [place] Patient [place] Phone [phone] Fax [phone] 06/20/2020 8:16 AM

## 2020-06-20 NOTE — Telephone Encounter (Signed)
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    I don't see that he has been scheduled with Dr. Tammi Klippel yet.   I think it would be wise for him to see [facility]

## 2020-06-21 ENCOUNTER — Telehealth: Payer: Self-pay | Admitting: Pharmacist

## 2020-06-21 NOTE — Telephone Encounter (Signed)
Copay is $0.00 per [facility] rep.  Patient should receive medication on 06/23/20.  Scott [name] Patient [place] Phone [phone] Fax [phone] 06/21/2020 4:49 PM

## 2020-06-21 NOTE — Telephone Encounter (Signed)
Oral Chemotherapy Pharmacist Encounter  Patient stated that [facility] will be delivering his Zytiga on Thursday 06/23/20. He knows to get started once he has the Zytiga in hand. He has already picked up his prednisone.   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   Pt is doing well. Counseled patient on administration, dosing, side effects, monitoring, drug-food interactions, safe handling, storage, and disposal. Patient will take: Zytiga (abiraterone): Take 3 tablets (750 mg total) by mouth daily for one week, then increase to 4 tablets (1,000 mg total). Take on an empty stomach 1 hour before or 2 hours after a meal Prednisone: Take 1 tablet (5 mg total) by mouth daily with breakfast.  Scott [name] asked about the 250mg  dosing of Zytiga  for cost savings he saw mentioned on a prostate cancer patient forum. I let him know that the 250mg  dosing is with a low-fat breakfast. He knows that that is not the plan for his administration, but this can be readdressed later if he ever has cost/access issues.  Side effects: Side effects include but not limited to: HTN, fatigue, edema, changes in liver function.   -Discuss HTN in detail with Scott [name] given his elevated in office BP readings. He reports that his readings are much lower at home than when he comes into offices for appts. I asked him to keep a daily BP log at home once he has started in the [place] and to bring that log with him to his f/u appt with Dr. Delton Coombes.   Reviewed with patient importance of keeping a medication schedule and plan for any missed doses.  After discussion with patient no patient barriers to medication adherence identified.   Scott [name] voiced understanding and appreciation. All questions  answered. Medication handout provided.  Provided patient with [facility] [facility] phone number. Patient knows to call the office with questions or concerns. Oral Chemotherapy Navigation Clinic will continue to follow.  Darl Pikes, PharmD, BCPS, BCOP, CPP Hematology/Oncology Clinical Pharmacist Practitioner [facility]/AP [facility] [phone]  06/21/2020 11:14 AM

## 2020-06-21 NOTE — Telephone Encounter (Signed)
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  CMP from 04/28/20 assessed, no relevant lab abnormalities. Prescription dose and frequency assessed. Md would like for patient to start with abiraterone 3 tablets during the first week and increase it to 4 tablets daily.  Of note, patient's BP has been elevated at his office visits. Essential HTN is an existing problem in his Problem List. A note from his primary care for 2020 stated he was reluctant to start medication for HTN. This will need to be monitored while he is one abiraterone, due to the risk of hypertension.   Current medication list in Epic reviewed, one DDIs with abiraterone identified: -Tramadol: abiraterone may decrease the concentration of tramadol active metabolites. No baseline dose adjustments needed. Monitor patient for decreased therapeutic response to tramadol  Evaluated chart and no patient barriers to medication adherence identified.   Prescription has been e-scribed to [place] for benefits analysis and approval.  Oral Oncology Clinic will continue to follow for insurance authorization, copayment issues, initial counseling and start date.  Patient agreed to treatment on 06/16/20 per MD documentation.  Darl Pikes, PharmD, BCPS, BCOP, CPP Hematology/Oncology Clinical Pharmacist Practitioner [facility] [place] [facility] [phone]  06/21/2020 9:39 AM

## 2020-06-22 ENCOUNTER — Other Ambulatory Visit (HOSPITAL_COMMUNITY): Payer: Self-pay | Admitting: *Deleted

## 2020-06-30 ENCOUNTER — Other Ambulatory Visit (HOSPITAL_COMMUNITY): Payer: Self-pay | Admitting: Hematology

## 2020-06-30 DIAGNOSIS — C61 Malignant neoplasm of prostate: Secondary | ICD-10-CM

## 2020-06-30 NOTE — Patient Instructions (Addendum)
20    Your procedure is scheduled on: 07/07/2020  Report to [place] Entrance at     12:00 PM.  Call this number if you have problems the morning of surgery: [phone]   Remember:   Follow instructions on letter from office regarding when to stop eating and drinking        No Smoking the day of procedure      Take these medicines the morning of surgery with A SIP OF WATER: NP Thyroid and tramadol if needed   Do not wear jewelry, make-up or nail polish.  Do not wear lotions, powders, or perfumes. You may wear deodorant.                Do not bring valuables to the hospital.  Contacts, dentures or bridgework may not be worn into surgery.  Leave suitcase in the car. After surgery it may be brought to your room.  For patients admitted to the hospital, checkout time is 11:00 AM the day of discharge.   Patients discharged the day of surgery will not be allowed to drive home. Upper Endoscopy, Adult Upper endoscopy is a procedure to look inside the upper GI (gastrointestinal) tract. The upper GI tract is made up of:  The part of the body that moves food from your mouth to your stomach (esophagus).  The stomach.  The first part of your small intestine (duodenum). This procedure is also called esophagogastroduodenoscopy (EGD) or gastroscopy. In this procedure, your health care provider passes a thin, flexible tube (endoscope) through your mouth and down your esophagus into your stomach. A small camera is attached to the end of the tube. Images from the camera appear on a monitor in the exam room. During this procedure, your health care provider may also remove a small piece of tissue to be sent to a lab and examined under a microscope (biopsy). Your health care provider may do an upper endoscopy to diagnose cancers of the upper GI tract. You may also have this procedure to find the cause of other conditions, such as:  Stomach pain.  Heartburn.  Pain or problems when  swallowing.  Nausea and vomiting.  Stomach bleeding.  Stomach ulcers. Tell a health care provider about:  Any allergies you have.  All medicines you are taking, including vitamins, herbs, eye drops, creams, and over-the-counter medicines.  Any problems you or family members have had with anesthetic medicines.  Any blood disorders you have.  Any surgeries you have had.  Any medical conditions you have.  Whether you are pregnant or may be pregnant. What are the risks? Generally, this is a safe procedure. However, problems may occur, including:  Infection.  Bleeding.  Allergic reactions to medicines.  A tear or hole (perforation) in the esophagus, stomach, or duodenum. What happens before the procedure? Staying hydrated Follow instructions from your health care provider about hydration, which may include:  Up to 2 hours before the procedure - you may continue to drink clear liquids, such as water, clear fruit juice, black coffee, and plain tea.  Eating and drinking restrictions Follow instructions from your health care provider about eating and drinking, which may include:  8 hours before the procedure - stop eating heavy meals or foods, such as meat, fried foods, or fatty foods.  6 hours before the procedure - stop eating light meals or foods, such as toast or cereal.  6 hours before the procedure - stop drinking milk or drinks that contain milk.  2 hours before the procedure - stop drinking clear liquids. Medicines Ask your health care provider about:  Changing or stopping your regular medicines. This is especially important if you are taking diabetes medicines or blood thinners.  Taking medicines such as aspirin and ibuprofen. These medicines can thin your blood. Do not take these medicines unless your health care provider tells you to take them.  Taking over-the-counter medicines, vitamins, herbs, and supplements. General instructions  Plan to have someone  take you home from the hospital or clinic.  If you will be going home right after the procedure, plan to have someone with you for 24 hours.  Ask your health care provider what steps will be taken to help prevent infection. What happens during the procedure?  1. An IV will be inserted into one of your veins. 2. You may be given one or more of the following: ? A medicine to help you relax (sedative). ? A medicine to numb the throat (local anesthetic). 3. You will lie on your left side on an exam table. 4. Your health care provider will pass the endoscope through your mouth and down your esophagus. 5. Your health care provider will use the scope to check the inside of your esophagus, stomach, and duodenum. Biopsies may be taken. 6. The endoscope will be removed. The procedure may vary among health care providers and hospitals. What happens after the procedure?  Your blood pressure, heart rate, breathing rate, and blood oxygen level will be monitored until you leave the hospital or clinic.  Do not drive for 24 hours if you were given a sedative during your procedure.  When your throat is no longer numb, you may be given some fluids to drink.  It is up to you to get the results of your procedure. Ask your health care provider, or the department that is doing the procedure, when your results will be ready. Summary  Upper endoscopy is a procedure to look inside the upper GI tract.  During the procedure, an IV will be inserted into one of your veins. You may be given a medicine to help you relax.  A medicine will be used to numb your throat.  The endoscope will be passed through your mouth and down your esophagus. This information is not intended to replace advice given to you by your health care provider. Make sure you discuss any questions you have with your health care provider. Document Revised: 08/28/2017 Document Reviewed: 08/05/2017 Elsevier Patient Education  [place] After  Please read the instructions outlined below and refer to this sheet in the next few weeks. These discharge instructions provide you with general information on caring for yourself after you leave the  hospital. Your doctor may also give you specific instructions. While your treatment has been planned according to the most current medical practices available, unavoidable complications occasionally occur. If you have any problems or questions after discharge, please call your doctor. HOME CARE INSTRUCTIONS Activity  You may resume your regular activity but move at a slower pace for the next 24 hours.   Take frequent rest periods for the next 24 hours.   Walking will help expel (get rid of) the air and reduce the bloated feeling in your abdomen.   No driving for 24 hours (because of the anesthesia (medicine) used during the test).   You may shower.   Do not sign any important legal documents or operate any machinery for 24 hours (because of the anesthesia used during the test).  Nutrition  Drink plenty of fluids.   You may resume your normal diet.   Begin with a light meal and progress to your normal diet.   Avoid alcoholic beverages for 24 hours or as instructed by your caregiver.  Medications You may resume your normal medications unless your caregiver tells you otherwise. What you can expect today  You may experience abdominal discomfort such as a feeling of fullness or "gas" pains.   You may experience a sore throat for 2 to 3 days. This is normal. Gargling with salt water may help this.  Follow-up Your doctor will discuss the results of your test with you. SEEK IMMEDIATE MEDICAL CARE IF:  You have excessive nausea (feeling sick to your stomach) and/or vomiting.   You have severe abdominal pain and distention  (swelling).   You have trouble swallowing.   You have a temperature over 100 F (37.8 C).   You have rectal bleeding or vomiting of blood.  Document Released: 10/18/2003 Document Revised: 02/22/2011 Document Reviewed: 04/30/2007  [email or web address]">  Esophageal Dilatation Esophageal dilatation, also called esophageal dilation, is a procedure to widen or open a blocked or narrowed part of the esophagus. The esophagus is the part of the body that moves food and liquid from the mouth to the stomach. You may need this procedure if:  You have a buildup of scar tissue in your esophagus that makes it difficult, painful, or impossible to swallow. This can be caused by gastroesophageal reflux disease (GERD).  You have cancer of the esophagus.  There is a problem with how food moves through your esophagus. In some cases, you may need this procedure repeated at a later time to dilate the esophagus gradually. Tell a health care provider about:  Any allergies you have.  All medicines you are taking, including vitamins, herbs, eye drops, creams, and over-the-counter medicines.  Any problems you or family members have had with anesthetic medicines.  Any blood disorders you have.  Any surgeries you have had.  Any medical conditions you have.  Any antibiotic medicines you are required to take before dental procedures.  Whether you are pregnant or may be pregnant. What are the risks? Generally, this is a safe procedure. However, problems may occur, including:  Bleeding due to a tear in the lining of the esophagus.  A hole, or perforation, in the esophagus. What happens before the procedure?  Ask your health care provider about: ? Changing or stopping your regular medicines. This is especially important if you are taking diabetes medicines or blood thinners. ? Taking medicines such as aspirin and ibuprofen. These  medicines can thin your blood. Do not take these medicines unless your health care provider  tells you to take them. ? Taking over-the-counter medicines, vitamins, herbs, and supplements.  Follow instructions from your health care provider about eating or drinking restrictions.  Plan to have a responsible adult take you home from the hospital or clinic.  Plan to have a responsible adult care for you for the time you are told after you leave the hospital or clinic. This is important. What happens during the procedure?  You may be given a medicine to help you relax (sedative).  A numbing medicine may be sprayed into the back of your throat, or you may gargle the medicine.  Your health care provider may perform the dilatation using various surgical instruments, such as: ? Simple dilators. This instrument is carefully placed in the esophagus to stretch it. ? Guided wire bougies. This involves using an endoscope to insert a wire into the esophagus. A dilator is passed over this wire to enlarge the esophagus. Then the wire is removed. ? Balloon dilators. An endoscope with a small balloon is inserted into the esophagus. The balloon is inflated to stretch the esophagus and open it up. The procedure may vary among health care providers and hospitals. What can I expect after the procedure?  Your blood pressure, heart rate, breathing rate, and blood oxygen level will be monitored until you leave the hospital or clinic.  Your throat may feel slightly sore and numb. This will get better over time.  You will not be allowed to eat or drink until your throat is no longer numb.  When you are able to drink, urinate, and sit on the edge of the bed without nausea or dizziness, you may be able to return home. Follow these instructions at home:  Take over-the-counter and prescription medicines only as told by your health care provider.  If you were given a sedative during the procedure, it can affect you  for several hours. Do not drive or operate machinery until your health care provider says that it is safe.  Plan to have a responsible adult care for you for the time you are told. This is important.  Follow instructions from your health care provider about any eating or drinking restrictions.  Do not use any products that contain nicotine or tobacco, such as cigarettes, e-cigarettes, and chewing tobacco. If you need help quitting, ask your health care provider.  Keep all follow-up visits. This is important. Contact a health care provider if:  You have a fever.  You have pain that is not relieved by medicine. Get help right away if:  You have chest pain.  You have trouble breathing.  You have trouble swallowing.  You vomit blood.  You have black, tarry, or bloody stools. These symptoms may represent a serious problem that is an emergency. Do not wait to see if the symptoms will go away. Get medical help right away. Call your local emergency services (911 in the [place] Do not drive yourself to the hospital. Summary  Esophageal dilatation, also called esophageal dilation, is a procedure to widen or open a blocked or narrowed part of the esophagus.  Plan to have a responsible adult take you home from the hospital or clinic.  For this procedure, a numbing medicine may be sprayed into the back of your throat, or you may gargle the medicine.  Do not drive or operate machinery until your health care provider says that it is safe. This information is not intended to replace advice given to you by your health care provider. Make  sure you discuss any questions you have with your health care provider. Document Revised: 07/22/2019 Document Reviewed: 07/22/2019 Elsevier Patient Education  [place].

## 2020-07-06 ENCOUNTER — Other Ambulatory Visit: Payer: Self-pay

## 2020-07-06 ENCOUNTER — Other Ambulatory Visit (HOSPITAL_COMMUNITY)
Admission: RE | Admit: 2020-07-06 | Discharge: 2020-07-06 | Disposition: A | Discharge status: Home / Self Care | Payer: BC Managed Care – PPO | Source: Ambulatory Visit | Attending: Internal Medicine | Admitting: Internal Medicine

## 2020-07-06 ENCOUNTER — Encounter (HOSPITAL_COMMUNITY): Payer: Self-pay

## 2020-07-06 ENCOUNTER — Encounter (HOSPITAL_COMMUNITY)
Admission: RE | Admit: 2020-07-06 | Discharge: 2020-07-06 | Disposition: A | Discharge status: Home / Self Care | Payer: BC Managed Care – PPO | Source: Ambulatory Visit | Attending: Internal Medicine | Admitting: Internal Medicine

## 2020-07-06 DIAGNOSIS — Z791 Long term (current) use of non-steroidal anti-inflammatories (NSAID): Secondary | ICD-10-CM | POA: Diagnosis not present

## 2020-07-06 DIAGNOSIS — K449 Diaphragmatic hernia without obstruction or gangrene: Secondary | ICD-10-CM | POA: Diagnosis not present

## 2020-07-06 DIAGNOSIS — Z8042 Family history of malignant neoplasm of prostate: Secondary | ICD-10-CM | POA: Diagnosis not present

## 2020-07-06 DIAGNOSIS — Z79899 Other long term (current) drug therapy: Secondary | ICD-10-CM | POA: Diagnosis not present

## 2020-07-06 DIAGNOSIS — Z7952 Long term (current) use of systemic steroids: Secondary | ICD-10-CM | POA: Diagnosis not present

## 2020-07-06 DIAGNOSIS — Z01812 Encounter for preprocedural laboratory examination: Secondary | ICD-10-CM | POA: Insufficient documentation

## 2020-07-06 DIAGNOSIS — Z85830 Personal history of malignant neoplasm of bone: Secondary | ICD-10-CM | POA: Diagnosis not present

## 2020-07-06 DIAGNOSIS — Z87891 Personal history of nicotine dependence: Secondary | ICD-10-CM | POA: Diagnosis not present

## 2020-07-06 DIAGNOSIS — Z8249 Family history of ischemic heart disease and other diseases of the circulatory system: Secondary | ICD-10-CM | POA: Diagnosis not present

## 2020-07-06 DIAGNOSIS — Z8546 Personal history of malignant neoplasm of prostate: Secondary | ICD-10-CM | POA: Diagnosis not present

## 2020-07-06 DIAGNOSIS — K222 Esophageal obstruction: Secondary | ICD-10-CM | POA: Diagnosis not present

## 2020-07-06 DIAGNOSIS — I10 Essential (primary) hypertension: Secondary | ICD-10-CM | POA: Diagnosis not present

## 2020-07-06 DIAGNOSIS — Z20822 Contact with and (suspected) exposure to covid-19: Secondary | ICD-10-CM | POA: Insufficient documentation

## 2020-07-06 DIAGNOSIS — Z803 Family history of malignant neoplasm of breast: Secondary | ICD-10-CM | POA: Diagnosis not present

## 2020-07-06 DIAGNOSIS — Z825 Family history of asthma and other chronic lower respiratory diseases: Secondary | ICD-10-CM | POA: Diagnosis not present

## 2020-07-06 DIAGNOSIS — Z800 Family history of malignant neoplasm of digestive organs: Secondary | ICD-10-CM | POA: Diagnosis not present

## 2020-07-06 DIAGNOSIS — R1314 Dysphagia, pharyngoesophageal phase: Secondary | ICD-10-CM | POA: Diagnosis not present

## 2020-07-06 DIAGNOSIS — Z820 Family history of epilepsy and other diseases of the nervous system: Secondary | ICD-10-CM | POA: Diagnosis not present

## 2020-07-06 HISTORY — DX: Nausea with vomiting, unspecified: R11.2

## 2020-07-06 HISTORY — DX: Other specified postprocedural states: Z98.890

## 2020-07-06 HISTORY — DX: Other complications of anesthesia, initial encounter: T88.59XA

## 2020-07-06 LAB — SARS CORONAVIRUS 2 (TAT 6-24 HRS): SARS Coronavirus 2: NEGATIVE

## 2020-07-07 ENCOUNTER — Ambulatory Visit (HOSPITAL_COMMUNITY)
Admission: RE | Admit: 2020-07-07 | Discharge: 2020-07-07 | Disposition: A | Discharge status: Home / Self Care | Payer: BC Managed Care – PPO | Attending: Internal Medicine | Admitting: Internal Medicine

## 2020-07-07 ENCOUNTER — Ambulatory Visit (HOSPITAL_COMMUNITY): Payer: BC Managed Care – PPO | Admitting: Anesthesiology

## 2020-07-07 ENCOUNTER — Encounter (HOSPITAL_COMMUNITY)
Admission: RE | Disposition: A | Discharge status: Home / Self Care | Payer: Self-pay | Source: Home / Self Care | Attending: Internal Medicine

## 2020-07-07 ENCOUNTER — Encounter (HOSPITAL_COMMUNITY): Payer: Self-pay | Admitting: Internal Medicine

## 2020-07-07 ENCOUNTER — Other Ambulatory Visit: Payer: Self-pay

## 2020-07-07 DIAGNOSIS — Z825 Family history of asthma and other chronic lower respiratory diseases: Secondary | ICD-10-CM | POA: Insufficient documentation

## 2020-07-07 DIAGNOSIS — R1310 Dysphagia, unspecified: Secondary | ICD-10-CM

## 2020-07-07 DIAGNOSIS — Z87891 Personal history of nicotine dependence: Secondary | ICD-10-CM | POA: Insufficient documentation

## 2020-07-07 DIAGNOSIS — Z85830 Personal history of malignant neoplasm of bone: Secondary | ICD-10-CM | POA: Insufficient documentation

## 2020-07-07 DIAGNOSIS — Z803 Family history of malignant neoplasm of breast: Secondary | ICD-10-CM | POA: Insufficient documentation

## 2020-07-07 DIAGNOSIS — Z8042 Family history of malignant neoplasm of prostate: Secondary | ICD-10-CM | POA: Insufficient documentation

## 2020-07-07 DIAGNOSIS — I10 Essential (primary) hypertension: Secondary | ICD-10-CM | POA: Insufficient documentation

## 2020-07-07 DIAGNOSIS — Z8546 Personal history of malignant neoplasm of prostate: Secondary | ICD-10-CM | POA: Insufficient documentation

## 2020-07-07 DIAGNOSIS — Z7952 Long term (current) use of systemic steroids: Secondary | ICD-10-CM | POA: Insufficient documentation

## 2020-07-07 DIAGNOSIS — Z20822 Contact with and (suspected) exposure to covid-19: Secondary | ICD-10-CM | POA: Insufficient documentation

## 2020-07-07 DIAGNOSIS — K222 Esophageal obstruction: Secondary | ICD-10-CM | POA: Insufficient documentation

## 2020-07-07 DIAGNOSIS — R1314 Dysphagia, pharyngoesophageal phase: Secondary | ICD-10-CM | POA: Diagnosis not present

## 2020-07-07 DIAGNOSIS — Z79899 Other long term (current) drug therapy: Secondary | ICD-10-CM | POA: Insufficient documentation

## 2020-07-07 DIAGNOSIS — Z820 Family history of epilepsy and other diseases of the nervous system: Secondary | ICD-10-CM | POA: Insufficient documentation

## 2020-07-07 DIAGNOSIS — Z800 Family history of malignant neoplasm of digestive organs: Secondary | ICD-10-CM | POA: Insufficient documentation

## 2020-07-07 DIAGNOSIS — Z8249 Family history of ischemic heart disease and other diseases of the circulatory system: Secondary | ICD-10-CM | POA: Insufficient documentation

## 2020-07-07 DIAGNOSIS — K449 Diaphragmatic hernia without obstruction or gangrene: Secondary | ICD-10-CM | POA: Insufficient documentation

## 2020-07-07 DIAGNOSIS — Z791 Long term (current) use of non-steroidal anti-inflammatories (NSAID): Secondary | ICD-10-CM | POA: Insufficient documentation

## 2020-07-07 HISTORY — PX: ESOPHAGOGASTRODUODENOSCOPY (EGD) WITH PROPOFOL: SHX5813

## 2020-07-07 HISTORY — PX: MALONEY DILATION: SHX5535

## 2020-07-07 SURGERY — ESOPHAGOGASTRODUODENOSCOPY (EGD) WITH PROPOFOL
Anesthesia: General

## 2020-07-07 MED ORDER — LACTATED RINGERS IV SOLN: INTRAVENOUS | Status: DC | PRN

## 2020-07-07 MED ORDER — LACTATED RINGERS IV SOLN: INTRAVENOUS | Status: DC

## 2020-07-07 MED ORDER — PROPOFOL 500 MG/50ML IV EMUL
INTRAVENOUS | Status: DC | PRN
  Administered 2020-07-07: 150 ug/kg/min via INTRAVENOUS

## 2020-07-07 MED ORDER — PROPOFOL 10 MG/ML IV BOLUS
INTRAVENOUS | Status: AC
  Filled 2020-07-07: qty 40

## 2020-07-07 MED ORDER — PROPOFOL 10 MG/ML IV BOLUS
INTRAVENOUS | Status: AC
  Filled 2020-07-07: qty 20

## 2020-07-07 MED ORDER — PROPOFOL 10 MG/ML IV BOLUS
INTRAVENOUS | Status: DC | PRN
  Administered 2020-07-07: 50 mg via INTRAVENOUS
  Administered 2020-07-07 (×2): 20 mg via INTRAVENOUS

## 2020-07-07 NOTE — Discharge Instructions (Signed)
EGD Discharge instructions Please read the instructions outlined below and refer to this sheet in the next few weeks. These discharge instructions provide you with general information on caring for yourself after you leave the hospital. Your doctor may also give you specific instructions. While your treatment has been planned according to the most current medical practices available, unavoidable complications occasionally occur. If you have any problems or questions after discharge, please call your doctor. ACTIVITY  You may resume your regular activity but move at a slower pace for the next 24 hours.   Take frequent rest periods for the next 24 hours.   Walking will help expel (get rid of) the air and reduce the bloated feeling in your abdomen.   No driving for 24 hours (because of the anesthesia (medicine) used during the test).   You may shower.   Do not sign any important legal documents or operate any machinery for 24 hours (because of the anesthesia used during the test).  NUTRITION  Drink plenty of fluids.   You may resume your normal diet.   Begin with a light meal and progress to your normal diet.   Avoid alcoholic beverages for 24 hours or as instructed by your caregiver.  MEDICATIONS  You may resume your normal medications unless your caregiver tells you otherwise.  WHAT YOU CAN EXPECT TODAY  You may experience abdominal discomfort such as a feeling of fullness or "gas" pains.  FOLLOW-UP  Your doctor will discuss the results of your test with you.  SEEK IMMEDIATE MEDICAL ATTENTION IF ANY OF THE FOLLOWING OCCUR:  Excessive nausea (feeling sick to your stomach) and/or vomiting.   Severe abdominal pain and distention (swelling).   Trouble swallowing.   Temperature over 101 F (37.8 C).   Rectal bleeding or vomiting of blood.    You have a reflux related injury and narrowing in your esophagus.  Your esophagus was stretched today.  Begin Protonix 40 mg daily to  control your reflux symptoms.  Prescription provided.  Office visit with [place] in 6 weeks  09/29/2020 at 8:00 am  At patient request I called Doreene Eland at [phone] voice male message.  [email or web address]">  Esophageal Dilatation Esophageal dilatation, also called esophageal dilation, is a procedure to widen or open a blocked or narrowed part of the esophagus. The esophagus is the part of the body that moves food and liquid from the mouth to the stomach. You may need this procedure if:  You have a buildup of scar tissue in your esophagus that makes it difficult, painful, or impossible to swallow. This can be caused by gastroesophageal reflux disease (GERD).  You have cancer of the esophagus.  There is a problem with how food moves through your esophagus. In some cases, you may need this procedure repeated at a later time to dilate the esophagus gradually. Tell a health care provider about:  Any allergies you have.  All medicines you are taking, including vitamins, herbs, eye drops, creams, and over-the-counter medicines.  Any problems you or family members have had with anesthetic medicines.  Any blood disorders you have.  Any surgeries you have had.  Any medical conditions you have.  Any antibiotic medicines you are required to take before dental procedures.  Whether you are pregnant or may be pregnant. What are the risks? Generally, this is a safe procedure. However, problems may occur, including:  Bleeding due to a tear in the lining of the esophagus.  A hole, or perforation, in the esophagus.  What happens before the procedure?  Ask your health care provider about: ? Changing or stopping your regular medicines. This is especially important if you are taking diabetes medicines or blood thinners. ? Taking medicines such as aspirin and ibuprofen. These medicines can thin your blood. Do not take  these medicines unless your health care provider tells you to take them. ? Taking over-the-counter medicines, vitamins, herbs, and supplements.  Follow instructions from your health care provider about eating or drinking restrictions.  Plan to have a responsible adult take you home from the hospital or clinic.  Plan to have a responsible adult care for you for the time you are told after you leave the hospital or clinic. This is important. What happens during the procedure?  You may be given a medicine to help you relax (sedative).  A numbing medicine may be sprayed into the back of your throat, or you may gargle the medicine.  Your health care provider may perform the dilatation using various surgical instruments, such as: ? Simple dilators. This instrument is carefully placed in the esophagus to stretch it. ? Guided wire bougies. This involves using an endoscope to insert a wire into the esophagus. A dilator is passed over this wire to enlarge the esophagus. Then the wire is removed. ? Balloon dilators. An endoscope with a small balloon is inserted into the esophagus. The balloon is inflated to stretch the esophagus and open it up. The procedure may vary among health care providers and hospitals. What can I expect after the procedure?  Your blood pressure, heart rate, breathing rate, and blood oxygen level will be monitored until you leave the hospital or clinic.  Your throat may feel slightly sore and numb. This will get better over time.  You will not be allowed to eat or drink until your throat is no longer numb.  When you are able to drink, urinate, and sit on the edge of the bed without nausea or dizziness, you may be able to return home. Follow these instructions at home:  Take over-the-counter and prescription medicines only as told by your health care provider.  If you were given a sedative during the procedure, it can affect you for several hours. Do not drive or operate  machinery until your health care provider says that it is safe.  Plan to have a responsible adult care for you for the time you are told. This is important.  Follow instructions from your health care provider about any eating or drinking restrictions.  Do not use any products that contain nicotine or tobacco, such as cigarettes, e-cigarettes, and chewing tobacco. If you need help quitting, ask your health care provider.  Keep all follow-up visits. This is important. Contact a health care provider if:  You have a fever.  You have pain that is not relieved by medicine. Get help right away if:  You have chest pain.  You have trouble breathing.  You have trouble swallowing.  You vomit blood.  You have black, tarry, or bloody stools. These symptoms may represent a serious problem that is an emergency. Do not wait to see if the symptoms will go away. Get medical help right away. Call your local emergency services (911 in the [place] Do not drive yourself to the hospital. Summary  Esophageal dilatation, also called esophageal dilation, is a procedure to widen or open a blocked or narrowed part of the esophagus.  Plan to have a responsible adult take you home from the hospital or  clinic.  For this procedure, a numbing medicine may be sprayed into the back of your throat, or you may gargle the medicine.  Do not drive or operate machinery until your health care provider says that it is safe. This information is not intended to replace advice given to you by your health care provider. Make sure you discuss any questions you have with your health care provider. Document Revised: 07/22/2019 Document Reviewed: 07/22/2019 Elsevier Patient Education  [place].

## 2020-07-07 NOTE — H&P (Addendum)
@LOGO @   Primary Care Physician:  Doree Albee, MD Primary Gastroenterologist:  Dr. Gala Romney  Pre-Procedure History & Physical: HPI:  Scott [name] is a 65 y.o. male here for further evaluation of esophageal dysphagia.  No significant reflux on cimetidine.  EGD planned today with possible esophageal dilation.  Past Medical History:  Diagnosis Date  . Bladder obstruction   . Complication of anesthesia   . Diverticulitis 2015  . Dyspnea   . Essential hypertension, benign 01/26/2019   at [place] office elevated, normal at home, no medications at this time  . History of basal cell cancer   . HLD (hyperlipidemia) 01/26/2019  . Hypothyroidism    history of, not currently taking medication  . PONV (postoperative nausea and vomiting)   . 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 Vitamin D deficiency disease 01/26/2019    Past Surgical History:  Procedure Laterality Date  . CYSTOSCOPY W/ URETERAL STENT PLACEMENT Left 11/10/2019   Procedure: CYSTOSCOPY;  Surgeon: Irine Seal, MD;  Location: [facility];  Service: Urology;  Laterality: Left;  . HERNIA REPAIR  0626   UMBILICAL   . PROSTATE SURGERY    . SKIN CANCER EXCISION    . TONSILLECTOMY Bilateral   . TOOTH EXTRACTION     front left tooth pulled but no surgery or stitches  . TRANSURETHRAL RESECTION OF PROSTATE N/A 01/25/2017   Procedure: TRANSURETHRAL RESECTION OF THE PROSTATE (TURP);  Surgeon: Alexis Frock, MD;  Location: [facility];  Service: Urology;  Laterality: N/A;  . TRANSURETHRAL RESECTION OF PROSTATE N/A 11/10/2019   Procedure: TRANSURETHRAL RESECTION OF THE PROSTATE (TURP);  Surgeon: Irine Seal, MD;  Location: [facility];  Service: Urology;  Laterality: N/A;  . WISDOM TOOTH EXTRACTION      Prior to Admission medications   Medication Sig Start Date End Date Taking? Authorizing Provider  abiraterone acetate (ZYTIGA) 250 MG tablet TAKE 4 TABLETS (1,000 MG TOTAL) DAILY. TAKE ON AN EMPTY STOMACH 1 HOUR BEFORE OR 2 HOURS AFTER A  MEAL 06/30/20  Yes [name], MD  arginine 500 MG tablet Take 500 mg by mouth 2 (two) times daily.   Yes [provider]  ASTRAGALUS PO Take 1 tablet by mouth 2 (two) times daily.   Yes [provider]  Cholecalciferol 125 MCG (5000 UT) capsule Take 5,000-10,000 Units by mouth See admin instructions. Take 10000 units in the morning and 5000 units at night   Yes [provider]  cimetidine (TAGAMET) 200 MG tablet Take 200-400 mg by mouth daily as needed (inflammation).    Yes [provider]  DANDELION ROOT PO Take 500 mg by mouth daily.   Yes [provider]  dutasteride (AVODART) 0.5 MG capsule Take 1 capsule (0.5 mg total) by mouth daily. 04/28/20  Yes Irine Seal, MD  estradiol (ESTRACE) 2 MG tablet Take 2 tablets (4 mg total) by mouth daily. Patient taking differently: Take 2 mg by mouth daily. 03/02/20  Yes Gosrani, Nimish C, MD  ibuprofen (ADVIL) 200 MG tablet Take 400 mg by mouth every 6 (six) hours as needed for moderate pain.   Yes [provider]  IRON HEME POLYPEPTIDE PO Take 1 tablet by mouth every other day.   Yes [provider]  Leuprolide Acetate, 6 Month, (LUPRON DEPOT, 16-MONTH,) 45 MG injection Inject 45 mg into the muscle every 6 (six) months.   Yes [provider]  MAGNESIUM PO Take 1 tablet by mouth at bedtime.   Yes  [provider]  MELATONIN PO Take 40 mg by mouth at bedtime.   Yes [provider]  MILK THISTLE PO Take 1 capsule by mouth daily.   Yes [provider]  Misc Natural Products (TURMERIC CURCUMIN) CAPS Take 1 capsule by mouth 2 (two) times daily.   Yes [provider]  NATTOKINASE PO Take 2 capsules by mouth daily.   Yes [provider]  NP THYROID 90 MG tablet Take 1 tablet (90 mg total) by mouth daily. 04/22/20  Yes Gosrani, Nimish C, MD  OVER THE COUNTER MEDICATION Take 1 capsule by mouth 2 (two) times daily. [place] Tail Mushroom   Anti-Cancer /Immune Booster   Yes [provider]  OVER THE COUNTER MEDICATION Take 1 capsule by mouth daily. Gamma E otc supplement   Yes [provider]  OVER THE COUNTER MEDICATION Take 1 capsule by mouth 2 (two) times daily. zyflamend otc supplement   Yes [provider]  predniSONE (DELTASONE) 5 MG tablet Take 1 tablet (5 mg total) by mouth daily with breakfast. 06/16/20  Yes [name], MD  QUERCETIN PO Take 1 tablet by mouth 2 (two) times daily.   Yes [provider]  traMADol (ULTRAM) 50 MG tablet Take 1 tablet (50 mg total) by mouth every 12 (twelve) hours as needed. 06/16/20  Yes [name], MD  VITAMIN K PO Take 1 capsule by mouth every other day.   Yes [provider]  Zinc 30 MG CAPS Take 30 mg by mouth daily.   Yes [provider]    Allergies as of 06/02/2020 - Review Complete 06/02/2020  Allergen Reaction Noted  . Wellbutrin [bupropion] Hives 12/18/2016    Family History  Problem Relation Age of Onset  . Emphysema Mother   . Alzheimer's disease Father   . Prostate cancer Father        dx in his 46s  . Prostate cancer Brother        dx in his 90s  . Breast cancer Maternal Grandmother        23s  . Pancreatic cancer Maternal Grandfather        68s  . Dementia Paternal Grandmother   . Heart attack Paternal Grandfather   . Colon cancer Neg Hx     Social History   Socioeconomic History  . Marital status: Married    Spouse name: Not on file  . Number of children: 1  . Years of education: Not on file  . Highest education level: Not on file  Occupational History    Comment: working full time  Tobacco Use  . Smoking status: Former Smoker    Packs/day: 1.50    Years: 35.00    Pack years: 52.50    Types: Cigarettes    Quit date: 01/12/2004    Years since quitting: 16.4  . Smokeless tobacco: Never Used  . Tobacco comment: OVER 30 YEARS HX OF SMOKING   Vaping Use  . Vaping Use: Never used   Substance and Sexual Activity  . Alcohol use: Not Currently    Comment: heavy drinker until 2004  . Drug use: Not Currently    Types: Marijuana  . Sexual activity: Not Currently  Other Topics Concern  . Not on file  Social History Narrative   Married for 19 years.Works for [employer] from home.   Social Determinants of Health   Financial Resource Strain: Low Risk   . Difficulty of Paying Living Expenses: Not hard at all  Food Insecurity: No Food Insecurity  . Worried About Charity fundraiser in the Last Year: Never true  . Ran Out of Food in the Last Year: Never true  Transportation Needs: No Transportation Needs  . Lack of Transportation (Medical): No  . Lack of Transportation (Non-Medical): No  Physical Activity: Inactive  . Days of Exercise per Week: 0 days  . Minutes of Exercise per Session: 0 min  Stress: No Stress Concern Present  . Feeling of Stress : Only a little  Social Connections: Moderately Integrated  . Frequency of Communication with Friends and Family: Three times a week  . Frequency of Social Gatherings with Friends and Family: Never  . Attends Religious Services: Never  . Active Member of Clubs or Organizations: Yes  . Attends Archivist Meetings: 1 to 4 times per year  . Marital Status: Married  Human resources officer Violence: Not At Risk  . Fear of Current or Ex-Partner: No  . Emotionally Abused: No  . Physically Abused: No  . Sexually Abused: No    Review of Systems: See HPI, otherwise negative ROS  Physical Exam: BP (!) 146/93   Pulse 77   Temp 98.6 F (37 C) (Oral)   Resp 19   Wt 79 kg   SpO2 100%   BMI 28.11 kg/m  General:   Alert,  Well-developed, well-nourished, pleasant and cooperative in NAD Neck:  Supple; no masses or thyromegaly. No significant cervical adenopathy. Lungs:  Clear throughout to auscultation.   No wheezes, crackles, or rhonchi. No acute distress. Heart:  Regular rate and rhythm; no murmurs, clicks,  rubs,  or gallops. Abdomen: Non-distended, normal bowel sounds.  Soft and nontender without appreciable mass or hepatosplenomegaly.  Pulses:  Normal pulses noted. Extremities:  Without clubbing or edema.  Impression/Plan: Pleasant 65 year old gentleman with chronic esophageal dysphagia to solids. Have offered the patient a EGD with possible esophageal dilation as feasible/appropriate per plan. The risks, benefits, limitations, alternatives and imponderables have been reviewed with the patient. Potential for esophageal dilation, biopsy, etc. have also been reviewed.  Questions have been answered. All parties agreeable.     Notice: This dictation was prepared with Dragon dictation along with smaller phrase technology. Any transcriptional errors that result from this process are unintentional and may not be corrected upon review.

## 2020-07-07 NOTE — Op Note (Signed)
[facility] Patient Name: Scott [name] Procedure Date: 07/07/2020 1:09 PM MRN: [id number] Date of Birth: 03-26-1955 Attending MD: Norvel Richards , MD CSN: [id number] Age: 65 Admit Type: Outpatient Procedure:                Upper GI endoscopy Indications:              Dysphagia Providers:                Norvel Richards, MD, Gwenlyn Fudge, RN, Aram Candela Referring MD:              Medicines:                Propofol per Anesthesia Complications:            No immediate complications. Estimated Blood Loss:     Estimated blood loss was minimal. Procedure:                Pre-Anesthesia Assessment:                           - Prior to the procedure, a History and Physical                            was performed, and patient medications and                            allergies were reviewed. The patient's tolerance of                            previous anesthesia was also reviewed. The risks                            and benefits of the procedure and the sedation                            options and risks were discussed with the patient.                            All questions were answered, and informed consent                            was obtained. Prior Anticoagulants: The patient has                            taken no previous anticoagulant or antiplatelet                            agents. ASA Grade Assessment: III - A patient with                            severe systemic disease. After reviewing the risks  and benefits, the patient was deemed in                            satisfactory condition to undergo the procedure.                           After obtaining informed consent, the endoscope was                            passed under direct vision. Throughout the                            procedure, the patient's blood pressure, pulse, and                            oxygen saturations were monitored  continuously. The                            [id number]-H190 ([id number]) scope was introduced through the                            mouth, and advanced to the second part of duodenum.                            The upper GI endoscopy was accomplished without                            difficulty. The patient tolerated the procedure                            well. Scope In: 1:36:00 PM Scope Out: 1:41:37 PM Total Procedure Duration: 0 hours 5 minutes 37 seconds  Findings:      Short peptic stricture at the GE junction -held passage of the adult       gastroscope. It was partially dilated with passage. No Barrett's       esophagus or tumor seen.      A small hiatal hernia was present. Gastric mucosa otherwise appeared       normal. Patent pylorus.      The duodenal bulb and second portion of the duodenum were normal. Scope       was withdrawn and a 52 French Maloney dilator was passed to full       insertion with only mild resistance. A look back revealed the stricture       to been nicely dilated without apparent complication minimal bleeding. Impression:               - Peptic esophageal stricture dilated with passage                            of the scope and with a Maloney dilator.                           - Small hiatal hernia.                           -  Normal duodenal bulb and second portion of the                            duodenum.                           - No specimens collected. Moderate Sedation:      Moderate (conscious) sedation was personally administered by an       anesthesia professional. The following parameters were monitored: oxygen       saturation, heart rate, blood pressure, respiratory rate, EKG, adequacy       of pulmonary ventilation, and response to care. Recommendation:           - Patient has a contact number available for                            emergencies. The signs and symptoms of potential                            delayed complications were  discussed with the                            patient. Return to normal activities tomorrow.                            Written discharge instructions were provided to the                            patient.                           - Advance diet as tolerated. Begin Protonix 40 mg                            daily each morning before breakfast. Office visit                            with [place] in 6 weeks. Procedure Code(s):        --- Professional ---                           [phone], Esophagogastroduodenoscopy, flexible,                            transoral; diagnostic, including collection of                            specimen(s) by brushing or washing, when performed                            (separate procedure) Diagnosis Code(s):        --- Professional ---                           K44.9, Diaphragmatic hernia without obstruction or  gangrene                           R13.10, Dysphagia, unspecified CPT copyright 2019 American Medical Association. All rights reserved. The codes documented in this report are preliminary and upon coder review may  be revised to meet current compliance requirements. Cristopher Estimable. Jennfer Gassen, MD Norvel Richards, MD 07/07/2020 1:56:46 PM This report has been signed electronically. Number of Addenda: 0

## 2020-07-07 NOTE — Anesthesia Preprocedure Evaluation (Signed)
Anesthesia Evaluation  Patient identified by MRN, date of birth, ID band Patient awake    Reviewed: Allergy & Precautions, H&P , NPO status , Patient's Chart, lab work & pertinent test results, reviewed documented beta blocker date and time   History of Anesthesia Complications (+) PONV and history of anesthetic complications  Airway Mallampati: II  TM Distance: >3 FB Neck ROM: full    Dental no notable dental hx. (+) Missing   Pulmonary shortness of breath, former smoker,    Pulmonary exam normal breath sounds clear to auscultation       Cardiovascular Exercise Tolerance: Good hypertension, negative cardio ROS   Rhythm:regular Rate:Normal     Neuro/Psych negative neurological ROS  negative psych ROS   GI/Hepatic negative GI ROS, Neg liver ROS,   Endo/Other  Hypothyroidism   Renal/GU negative Renal ROS  negative genitourinary   Musculoskeletal   Abdominal   Peds  Hematology negative hematology ROS (+)   Anesthesia Other Findings   Reproductive/Obstetrics negative OB ROS                             Anesthesia Physical Anesthesia Plan  ASA: II  Anesthesia Plan: General   Post-op Pain Management:    Induction:   PONV Risk Score and Plan: Propofol infusion  Airway Management Planned:   Additional Equipment:   Intra-op Plan:   Post-operative Plan:   Informed Consent: I have reviewed the patients History and Physical, chart, labs and discussed the procedure including the risks, benefits and alternatives for the proposed anesthesia with the patient or authorized representative who has indicated his/her understanding and acceptance.     Dental Advisory Given  Plan Discussed with: CRNA  Anesthesia Plan Comments:         Anesthesia Quick Evaluation

## 2020-07-07 NOTE — Anesthesia Postprocedure Evaluation (Signed)
Anesthesia Post Note  Patient: Scott [name]  Procedure(s) Performed: ESOPHAGOGASTRODUODENOSCOPY (EGD) WITH PROPOFOL (N/A ) MALONEY DILATION (N/A )  Patient location during evaluation: PACU Anesthesia Type: General Level of consciousness: awake and alert Pain management: pain level controlled Vital Signs Assessment: post-procedure vital signs reviewed and stable Respiratory status: spontaneous breathing Cardiovascular status: blood pressure returned to baseline and stable Postop Assessment: no apparent nausea or vomiting Anesthetic complications: no   No complications documented.   Last Vitals:  Vitals:   07/07/20 1224 07/07/20 1350  BP: (!) 146/93 111/60  Pulse: 77 (!) 57  Resp: 19 18  Temp: 37 C 36.7 C  SpO2: 100% 98%    Last Pain:  Vitals:   07/07/20 1350  TempSrc: Oral  PainSc: 0-No pain                 Chrles Selley

## 2020-07-07 NOTE — Transfer of Care (Signed)
Immediate Anesthesia Transfer of Care Note  Patient: Scott [name]  Procedure(s) Performed: ESOPHAGOGASTRODUODENOSCOPY (EGD) WITH PROPOFOL (N/A ) MALONEY DILATION (N/A )  Patient Location: PACU  Anesthesia Type:General  Level of Consciousness: awake  Airway & Oxygen Therapy: Patient Spontanous Breathing  Post-op Assessment: Report given to RN  Post vital signs: Reviewed  Last Vitals:  Vitals Value Taken Time  BP 111/60 07/07/20 1350  Temp 36.7 C 07/07/20 1350  Pulse 57 07/07/20 1350  Resp 18 07/07/20 1350  SpO2 98 % 07/07/20 1350    Last Pain:  Vitals:   07/07/20 1350  TempSrc: Oral  PainSc: 0-No pain      Patients Stated Pain Goal: 5 (11/65/79 0383)  Complications: No complications documented.

## 2020-07-12 ENCOUNTER — Encounter (HOSPITAL_COMMUNITY): Payer: Self-pay | Admitting: Internal Medicine

## 2020-07-19 ENCOUNTER — Ambulatory Visit
Admission: RE | Admit: 2020-07-19 | Discharge: 2020-07-19 | Disposition: A | Discharge status: Home / Self Care | Payer: BC Managed Care – PPO | Source: Ambulatory Visit | Attending: Urology | Admitting: Urology

## 2020-07-19 ENCOUNTER — Other Ambulatory Visit: Payer: Self-pay

## 2020-07-19 ENCOUNTER — Encounter: Payer: Self-pay | Admitting: Urology

## 2020-07-19 DIAGNOSIS — C7951 Secondary malignant neoplasm of bone: Secondary | ICD-10-CM

## 2020-07-19 DIAGNOSIS — C61 Malignant neoplasm of prostate: Secondary | ICD-10-CM | POA: Insufficient documentation

## 2020-07-19 NOTE — Progress Notes (Signed)
Radiation Oncology         [phone] ________________________________  Initial outpatient Consultation - Conducted via MyChart due to current COVID-19 concerns for limiting patient exposure  Name: Rylen Swindler MRN: [id number]  Date: 07/19/2020  DOB: September 18, 1955  HC:[id number], Doristine Johns, MD  Irine Seal, MD   REFERRING PHYSICIAN: Irine Seal, MD  DIAGNOSIS: 65 y.o. 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    ICD-10-CM   1. Metastasis to bone [facility])  C79.51     HISTORY OF PRESENT ILLNESS: Namari Breton is a 65 y.o. male with a diagnosis of metastatic castrate resistant prostate cancer. He was initially diagnosed with advanced prostate cancer with associated pelvic lymphadenopathy in October 2018 with a PSA of 159.8 at that time.  He presented to the ED on 12/18/2016 with a two week history of urinary retention and hematuria.  A catheter was placed at that time and he was referred for evaluation in urology by Dr. Tresa Moore on 12/27/2016,  digital rectal examination was performed revealing a "rock hard and fixed" prostate.  The patient proceeded to transrectal ultrasound with 12 biopsies of the prostate that same day.  The prostate volume measured 81 cc.  Out of 12 core biopsies, all 12 were positive.  The maximum Gleason score was 4+5, and this was seen in left mid lateral, left base, and left mid, all with perineural invasion. Gleason 4+4 was seen in the remaining 9 cores, with perineural invasion identified in all cores except right mid lateral and right mid. Extraprostatic extension was also seen in the left base lateral.  A CT A/P performed shortly after biopsy showed bilateral non-bulky iliac adenopathy. Bone scan performed on 01/01/2017 showed a single nonspecific focus of increased tracer in the left orbit.  He proceeded to TURP on 01/25/2017 with final  surgical pathology showing prostatic adenocarcinoma, Gleason score 4+5, involving 75% of tissue with perineural invasion present. He was started on Lupron ADT at that time.  He initially had an excellent response with PSA decreased to 2.86 in February 2019 and nadired at 1.87 in December 2019.  He received his second dose of Lupron in 07/2017, but this was stopped thereafter due to intolerable hot flashes, sleep deprivation, and decreased mental clarity. Dr. Jeffie Pollock took over his care in 01/2018. He was started on estradiol patch in 04/2018 for symptom management. His PSA subsequently rose to 6 in 07/2018, and he was placed back on the Lupron ADT with an initial response and decrease of the PSA to 4.37 in 08/2018 but again began rising shortly thereafter up to 4.64 in August 2020 and 6.46 in September 2020.   He underwent restaging scans on 11/26/2018 with CT C/A/P which confirmed no adenopathy or definite findings of active bony malignancy. There was nonspecific subtle sclerosis laterally in a right 6th rib but bone scan was negative for skeletal metastasis.  He was referred to Dr. Delton Coombes on 11/28/2018, who ordered Axumin PET scan. This was performed on 12/11/2018 and showed small subcentimeter periaortic lymph nodes and left external ilial lymph nodes, with only low-grade activity and favoring reactive nodes but there was asymmetric activity in the right lobe of the prostate gland without evidence of distant metastatic disease or skeletal metastasis.  His PSA continued to gradually rise despite resuming the Lupron ADT with a PSA at 7.31 in 01/2019.  We met with the  patient on 02/24/2019. At that time, he opted to defer radiation therapy and continue with ADT alone. He was seen by Dr. Jeffie Pollock on 04/10/19 and started Avodart. His PSA, however, continued to rise and reached 15.8 on 09/28/19. He underwent restaging CT A/P on 11/03/19, which revealed a mass in the posterior urinary bladder, a new sclerotic lesion in the  left anterior vertebral body of L1, and increased mild left periaortic adenopathy. He was taken for debulking TURP on 11/10/19, with pathology confirming the presence of adenocarcinoma. He then received 6 cycles of docetaxel from 11/16/19 to 02/29/20 under the care of Dr. Delton Coombes.  His PSA decreased on the docetaxel, to a nadir of 2.17 on 03/22/20. Unfortunately, this increased back up to 4.77 in 04/2020 and 12.9 on 05/26/20. He was complaining of progressive low back pain so he proceeded to restaging CT A/P on 06/03/20 showing a slight increase in size of left retroperitoneal and left external iliac lymph nodes with progressive sclerosis involving the L2 vertebral body and a new area of sclerosis involving the anterior aspect of the L1 vertebral body. This was followed by a bone scan on 06/14/20 also confirming progressive bony metastatic disease, with increased size and intensity of activity at L2 and a new focus of activity at L1. PSA was further increased at 18.81 on 06/16/20 so he was started on Zytiga, with his first dose on 06/23/20. Since starting the Zytiga and prednisone, his back pain has almost completely resolved and he is currently without complaints.  The patient has kindly been referred today for discussion of potential palliative radiation treatment options.   PREVIOUS RADIATION THERAPY: No  PAST MEDICAL HISTORY:  Past Medical History:  Diagnosis Date  . Bladder obstruction   . Complication of anesthesia   . Diverticulitis 2015  . Dyspnea   . Essential hypertension, benign 01/26/2019   at [place] office elevated, normal at home, no medications at this time  . History of basal cell cancer   . HLD (hyperlipidemia) 01/26/2019  . Hypothyroidism    history of, not currently taking medication  . PONV (postoperative nausea and vomiting)   . Prostate cancer ([place])    metastatic to bones  . Vitamin D deficiency disease 01/26/2019      PAST SURGICAL HISTORY: Past Surgical History:   Procedure Laterality Date  . CYSTOSCOPY W/ URETERAL STENT PLACEMENT Left 11/10/2019   Procedure: CYSTOSCOPY;  Surgeon: Irine Seal, MD;  Location: [facility] ORS;  Service: Urology;  Laterality: Left;  . ESOPHAGOGASTRODUODENOSCOPY (EGD) WITH PROPOFOL N/A 07/07/2020   Procedure: ESOPHAGOGASTRODUODENOSCOPY (EGD) WITH PROPOFOL;  Surgeon: Daneil Dolin, MD;  Location: [facility] SUITE;  Service: Endoscopy;  Laterality: N/A;  pm appt  . HERNIA REPAIR  0623   UMBILICAL   . MALONEY DILATION N/A 07/07/2020   Procedure: Venia Minks DILATION;  Surgeon: Daneil Dolin, MD;  Location: [facility];  Service: Endoscopy;  Laterality: N/A;  . PROSTATE SURGERY    . SKIN CANCER EXCISION    . TONSILLECTOMY Bilateral   . TOOTH EXTRACTION     front left tooth pulled but no surgery or stitches  . TRANSURETHRAL RESECTION OF PROSTATE N/A 01/25/2017   Procedure: TRANSURETHRAL RESECTION OF THE PROSTATE (TURP);  Surgeon: Alexis Frock, MD;  Location: [facility];  Service: Urology;  Laterality: N/A;  . TRANSURETHRAL RESECTION OF PROSTATE N/A 11/10/2019   Procedure: TRANSURETHRAL RESECTION OF THE PROSTATE (TURP);  Surgeon: Irine Seal, MD;  Location: [facility];  Service: Urology;  Laterality: N/A;  .  WISDOM TOOTH EXTRACTION      FAMILY HISTORY:  Family History  Problem Relation Age of Onset  . Emphysema Mother   . Alzheimer's disease Father   . Prostate cancer Father        dx in his 9s  . Prostate cancer Brother        dx in his 17s  . Breast cancer Maternal Grandmother        76s  . Pancreatic cancer Maternal Grandfather        22s  . Dementia Paternal Grandmother   . Heart attack Paternal Grandfather   . Colon cancer Neg Hx     SOCIAL HISTORY:  Social History   Socioeconomic History  . Marital status: Married    Spouse name: Not on file  . Number of children: 1  . Years of education: Not on file  . Highest education level: Not on file  Occupational History    Comment: working full time  Tobacco Use  .  Smoking status: Former Smoker    Packs/day: 1.50    Years: 35.00    Pack years: 52.50    Types: Cigarettes    Quit date: 01/12/2004    Years since quitting: 16.5  . Smokeless tobacco: Never Used  . Tobacco comment: OVER 30 YEARS HX OF SMOKING   Vaping Use  . Vaping Use: Never used  Substance and Sexual Activity  . Alcohol use: Not Currently    Comment: heavy drinker until 2004  . Drug use: Not Currently    Types: Marijuana  . Sexual activity: Not Currently  Other Topics Concern  . Not on file  Social History Narrative   Married for 19 years.Works for [employer] from home.   Social Determinants of Health   Financial Resource Strain: Low Risk   . Difficulty of Paying Living Expenses: Not hard at all  Food Insecurity: No Food Insecurity  . Worried About Charity fundraiser in the Last Year: Never true  . Ran Out of Food in the Last Year: Never true  Transportation Needs: No Transportation Needs  . Lack of Transportation (Medical): No  . Lack of Transportation (Non-Medical): No  Physical Activity: Inactive  . Days of Exercise per Week: 0 days  . Minutes of Exercise per Session: 0 min  Stress: No Stress Concern Present  . Feeling of Stress : Only a little  Social Connections: Moderately Integrated  . Frequency of Communication with Friends and Family: Three times a week  . Frequency of Social Gatherings with Friends and Family: Never  . Attends Religious Services: Never  . Active Member of Clubs or Organizations: Yes  . Attends Archivist Meetings: 1 to 4 times per year  . Marital Status: Married  Human resources officer Violence: Not At Risk  . Fear of Current or Ex-Partner: No  . Emotionally Abused: No  . Physically Abused: No  . Sexually Abused: No    ALLERGIES: Ivp dye [iodinated diagnostic agents] and Wellbutrin [bupropion]  MEDICATIONS:  Current Outpatient Medications  Medication Sig Dispense Refill  . abiraterone acetate (ZYTIGA) 250 MG tablet TAKE  4 TABLETS (1,000 MG TOTAL) DAILY. TAKE ON AN EMPTY STOMACH 1 HOUR BEFORE OR 2 HOURS AFTER A MEAL 120 tablet 0  . arginine 500 MG tablet Take 500 mg by mouth 2 (two) times daily.    . ASTRAGALUS PO Take 1 tablet by mouth 2 (two) times daily.    . cimetidine (TAGAMET) 200 MG tablet Take 200-400  mg by mouth daily as needed (inflammation).     . DANDELION ROOT PO Take 500 mg by mouth daily.    Marland Kitchen dutasteride (AVODART) 0.5 MG capsule Take 1 capsule (0.5 mg total) by mouth daily. 30 capsule 11  . estradiol (ESTRACE) 2 MG tablet Take 2 tablets (4 mg total) by mouth daily. (Patient taking differently: Take 2 mg by mouth daily.) 60 tablet 3  . ibuprofen (ADVIL) 200 MG tablet Take 400 mg by mouth every 6 (six) hours as needed for moderate pain.    . IRON HEME POLYPEPTIDE PO Take 1 tablet by mouth every other day.    Marland Kitchen Leuprolide Acetate, 6 Month, (LUPRON DEPOT, 78-MONTH,) 45 MG injection Inject 45 mg into the muscle every 6 (six) months.    Marland Kitchen MAGNESIUM PO Take 1 tablet by mouth at bedtime.    Marland Kitchen MELATONIN PO Take 40 mg by mouth at bedtime.    Marland Kitchen MILK THISTLE PO Take 1 capsule by mouth daily.    . Misc Natural Products (TURMERIC CURCUMIN) CAPS Take 1 capsule by mouth 2 (two) times daily.    Marland Kitchen NATTOKINASE PO Take 2 capsules by mouth daily.    . NP THYROID 90 MG tablet Take 1 tablet (90 mg total) by mouth daily. 30 tablet 3  . OVER THE COUNTER MEDICATION Take 1 capsule by mouth 2 (two) times daily. [place] [employer] Mushroom  Anti-Cancer /Immune Scientist, product/process development    . OVER THE COUNTER MEDICATION Take 1 capsule by mouth daily. Gamma E otc supplement    . OVER THE COUNTER MEDICATION Take 1 capsule by mouth 2 (two) times daily. zyflamend otc supplement    . predniSONE (DELTASONE) 5 MG tablet Take 1 tablet (5 mg total) by mouth daily with breakfast. 30 tablet 6  . QUERCETIN PO Take 1 tablet by mouth 2 (two) times daily.    . traMADol (ULTRAM) 50 MG tablet Take 1 tablet (50 mg total) by mouth every 12 (twelve) hours as needed. 30  tablet 0  . VITAMIN K PO Take 1 capsule by mouth every other day.    . Zinc 30 MG CAPS Take 30 mg by mouth daily.    . Cholecalciferol 125 MCG (5000 UT) capsule Take 5,000-10,000 Units by mouth See admin instructions. Take 10000 units in the morning and 5000 units at night (Patient not taking: Reported on 07/19/2020)     No current facility-administered medications for this encounter.   Facility-Administered Medications Ordered in Other Encounters  Medication Dose Route Frequency Provider Last Rate Last Admin  . heparin lock flush 100 unit/mL  500 Units Intracatheter Once PRN [name], MD      . sodium chloride flush (NS) 0.9 % injection 10 mL  10 mL Intracatheter PRN [name], MD        REVIEW OF SYSTEMS:  On review of systems, the patient reports that he is doing well overall. He denies any chest pain, shortness of breath, cough, fevers, chills, night sweats, unintended weight changes. He denies any bowel disturbances, and denies abdominal pain, nausea or vomiting. He denies any new musculoskeletal or joint aches or pains. His IPSS was 23, indicating severe urinary symptoms. He reports occasional leakage related to urinary urgency and intermittent post-void dribble His SHIM was 1, indicating he has severe erectile dysfunction. A complete review of systems is obtained and is otherwise negative.    PHYSICAL EXAM:  Wt Readings from Last 3 Encounters:  07/07/20 174 lb 2.6 oz (79 kg)  07/06/20  175 lb (79.4 kg)  06/16/20 182 lb (82.6 kg)   Temp Readings from Last 3 Encounters:  07/07/20 98.1 F (36.7 C) (Oral)  07/06/20 98.4 F (36.9 C) (Oral)  06/16/20 98.3 F (36.8 C) (Oral)   BP Readings from Last 3 Encounters:  07/07/20 111/60  07/06/20 (!) 162/88  06/16/20 (!) 160/90   Pulse Readings from Last 3 Encounters:  07/07/20 (!) 57  07/06/20 67  06/16/20 81   Pain Assessment Pain Score: 0-No pain Pain Frequency: Rarely/10  In general this is a well appearing  Caucasian gentleman in no acute distress. He's alert and oriented x4 and appropriate throughout the examination. Cardiopulmonary assessment is negative for acute distress and he exhibits normal effort.    KPS = 90  100 - Normal; no complaints; no evidence of disease. 90   - Able to carry on normal activity; minor signs or symptoms of disease. 80   - Normal activity with effort; some signs or symptoms of disease. 56   - Cares for self; unable to carry on normal activity or to do active work. 60   - Requires occasional assistance, but is able to care for most of his personal needs. 50   - Requires considerable assistance and frequent medical care. 43   - Disabled; requires special care and assistance. 48   - Severely disabled; hospital admission is indicated although death not imminent. 54   - Very sick; hospital admission necessary; active supportive treatment necessary. 10   - Moribund; fatal processes progressing rapidly. 0     - Dead  Karnofsky DA, Abelmann [place], Craver LS and Burchenal [facility] [phone]) The use of the nitrogen mustards in the palliative treatment of carcinoma: with particular reference to bronchogenic carcinoma Cancer 1 634-56  LABORATORY DATA:  Lab Results  Component Value Date   WBC 5.9 04/28/2020   HGB 12.2 (L) 04/28/2020   HCT 36.4 (L) 04/28/2020   MCV 91.7 04/28/2020   PLT 249 04/28/2020   Lab Results  Component Value Date   NA 131 (L) 04/28/2020   K 4.0 04/28/2020   CL 103 04/28/2020   CO2 22 04/28/2020   Lab Results  Component Value Date   ALT 10 04/28/2020   AST 13 (L) 04/28/2020   ALKPHOS 49 04/28/2020   BILITOT 0.6 04/28/2020     RADIOGRAPHY: No results found.    IMPRESSION/PLAN: This visit was conducted via MyChart to spare the patient unnecessary potential exposure in the healthcare setting during the current COVID-19 pandemic. 1. 65 y.o. 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 Today, we talked to the patient about the findings and workup thus far. We discussed the natural history of prostate cancer carcinoma and general treatment, highlighting the role of palliative radiotherapy in the management of painful osseous metastases. We discussed the available radiation techniques, and focused on the details and logistics of delivery. The recommendation is for a 2 week course of daily palliaitve radiotherapy to the painful lesions at L1-L2. We reviewed the anticipated acute and late sequelae associated with radiation in this setting. He appears to have a good understanding of his disease and our treatment recommendations which are of palliative intent. The patient was encouraged to ask questions that were answered to his stated satisfaction.  At the end of our conversation, the patient would like to proceed with palliative radiation therapy  to L1-L2. He has provided verbal consent to proceed today and we have tentatively scheduled him for CT SIM on 07/26/20 at 10:30am in anticipation of beginning his daily treatments in the near future. We will share our discussion with Dr. Delton Coombes and Dr. Jeffie Pollock and move forward with treatment planning accordingly.   Given current concerns for patient exposure during the COVID-19 pandemic, this encounter was conducted via video-enabled MyChart visit. The patient has given verbal consent for this type of encounter. The time spent during this encounter was 45 minutes. The attendants for this meeting include Tyler Pita MD, Ashlyn Bruning PA-C, and patient, Gyan Cambre. During the encounter, Tyler Pita MD, Ashlyn Bruning PA-C, and scribe, Wilburn Mylar, were located at [facility] Radiation Oncology Department.  Patient, Eris Breck was located at home.    Nicholos Johns, PA-C    Tyler Pita, MD  [facility]  Dial: [phone]  Fax: [phone] [email or web address]  Skype  [email or web address]  This document serves as a record of services personally performed by Tyler Pita, MD and Freeman Caldron, PA-C. It was created on their behalf by Wilburn Mylar, a trained medical scribe. The creation of this record is based on the scribe's personal observations and the provider's statements to them. This document has been checked and approved by the attending provider.

## 2020-07-19 NOTE — Progress Notes (Signed)
I spoke with patient via my chart. 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 Stopped when he started prednisone and Zytiga about eight days ago. He has done extensive research on alternative treatments and is taking multiple supplements to help with inflammation and to help with decreasing the chance of blood clots.  Admits to some anxiety about his diagnosis but does not feel overwhelmed by it. Patient is alert, oriented x 3, dressed appropriately and appears his stated age. Patient denies any pain at this time.

## 2020-07-20 ENCOUNTER — Ambulatory Visit (INDEPENDENT_AMBULATORY_CARE_PROVIDER_SITE_OTHER): Payer: BC Managed Care – PPO | Admitting: Internal Medicine

## 2020-07-21 ENCOUNTER — Inpatient Hospital Stay (HOSPITAL_COMMUNITY): Payer: BC Managed Care – PPO

## 2020-07-21 ENCOUNTER — Inpatient Hospital Stay (HOSPITAL_COMMUNITY): Payer: BC Managed Care – PPO | Attending: Hematology | Admitting: Hematology

## 2020-07-21 ENCOUNTER — Other Ambulatory Visit: Payer: Self-pay

## 2020-07-21 VITALS — BP 143/71 | HR 61 | Temp 98.6°F | Resp 17 | Wt 179.2 lb

## 2020-07-21 DIAGNOSIS — E039 Hypothyroidism, unspecified: Secondary | ICD-10-CM | POA: Insufficient documentation

## 2020-07-21 DIAGNOSIS — C61 Malignant neoplasm of prostate: Secondary | ICD-10-CM | POA: Diagnosis not present

## 2020-07-21 DIAGNOSIS — I10 Essential (primary) hypertension: Secondary | ICD-10-CM | POA: Diagnosis not present

## 2020-07-21 DIAGNOSIS — C7951 Secondary malignant neoplasm of bone: Secondary | ICD-10-CM | POA: Insufficient documentation

## 2020-07-21 DIAGNOSIS — Z8042 Family history of malignant neoplasm of prostate: Secondary | ICD-10-CM | POA: Diagnosis not present

## 2020-07-21 DIAGNOSIS — R5383 Other fatigue: Secondary | ICD-10-CM | POA: Insufficient documentation

## 2020-07-21 DIAGNOSIS — Z9221 Personal history of antineoplastic chemotherapy: Secondary | ICD-10-CM | POA: Insufficient documentation

## 2020-07-21 DIAGNOSIS — Z803 Family history of malignant neoplasm of breast: Secondary | ICD-10-CM | POA: Insufficient documentation

## 2020-07-21 DIAGNOSIS — Z923 Personal history of irradiation: Secondary | ICD-10-CM | POA: Diagnosis not present

## 2020-07-21 DIAGNOSIS — N1330 Unspecified hydronephrosis: Secondary | ICD-10-CM | POA: Insufficient documentation

## 2020-07-21 DIAGNOSIS — E785 Hyperlipidemia, unspecified: Secondary | ICD-10-CM | POA: Diagnosis not present

## 2020-07-21 DIAGNOSIS — R0602 Shortness of breath: Secondary | ICD-10-CM | POA: Diagnosis not present

## 2020-07-21 DIAGNOSIS — E559 Vitamin D deficiency, unspecified: Secondary | ICD-10-CM | POA: Insufficient documentation

## 2020-07-21 DIAGNOSIS — Z192 Hormone resistant malignancy status: Secondary | ICD-10-CM | POA: Insufficient documentation

## 2020-07-21 DIAGNOSIS — Z800 Family history of malignant neoplasm of digestive organs: Secondary | ICD-10-CM | POA: Diagnosis not present

## 2020-07-21 DIAGNOSIS — Z85828 Personal history of other malignant neoplasm of skin: Secondary | ICD-10-CM | POA: Insufficient documentation

## 2020-07-21 DIAGNOSIS — Z87891 Personal history of nicotine dependence: Secondary | ICD-10-CM | POA: Insufficient documentation

## 2020-07-21 DIAGNOSIS — G629 Polyneuropathy, unspecified: Secondary | ICD-10-CM | POA: Diagnosis not present

## 2020-07-21 DIAGNOSIS — G893 Neoplasm related pain (acute) (chronic): Secondary | ICD-10-CM | POA: Diagnosis not present

## 2020-07-21 LAB — CBC WITH DIFFERENTIAL/PLATELET
Abs Immature Granulocytes: 0.06 10*3/uL (ref 0.00–0.07)
Basophils Absolute: 0.1 10*3/uL (ref 0.0–0.1)
Basophils Relative: 1 %
Eosinophils Absolute: 0.1 10*3/uL (ref 0.0–0.5)
Eosinophils Relative: 1 %
HCT: 37.2 % — ABNORMAL LOW (ref 39.0–52.0)
Hemoglobin: 13 g/dL (ref 13.0–17.0)
Immature Granulocytes: 1 %
Lymphocytes Relative: 13 %
Lymphs Abs: 1 10*3/uL (ref 0.7–4.0)
MCH: 31 pg (ref 26.0–34.0)
MCHC: 34.9 g/dL (ref 30.0–36.0)
MCV: 88.6 fL (ref 80.0–100.0)
Monocytes Absolute: 0.4 10*3/uL (ref 0.1–1.0)
Monocytes Relative: 5 %
Neutro Abs: 6 10*3/uL (ref 1.7–7.7)
Neutrophils Relative %: 79 %
Platelets: 261 10*3/uL (ref 150–400)
RBC: 4.2 MIL/uL — ABNORMAL LOW (ref 4.22–5.81)
RDW: 12.9 % (ref 11.5–15.5)
WBC: 7.5 10*3/uL (ref 4.0–10.5)
nRBC: 0 % (ref 0.0–0.2)

## 2020-07-21 LAB — COMPREHENSIVE METABOLIC PANEL
ALT: 11 U/L (ref 0–44)
AST: 13 U/L — ABNORMAL LOW (ref 15–41)
Albumin: 4 g/dL (ref 3.5–5.0)
Alkaline Phosphatase: 140 U/L — ABNORMAL HIGH (ref 38–126)
Anion gap: 6 (ref 5–15)
BUN: 19 mg/dL (ref 8–23)
CO2: 24 mmol/L (ref 22–32)
Calcium: 8.9 mg/dL (ref 8.9–10.3)
Chloride: 105 mmol/L (ref 98–111)
Creatinine, Ser: 0.72 mg/dL (ref 0.61–1.24)
GFR, Estimated: >60 mL/min (ref >60)
Glucose, Bld: 117 mg/dL — ABNORMAL HIGH (ref 70–99)
Potassium: 4 mmol/L (ref 3.5–5.1)
Sodium: 135 mmol/L (ref 135–145)
Total Bilirubin: 0.7 mg/dL (ref 0.3–1.2)
Total Protein: 6.6 g/dL (ref 6.5–8.1)

## 2020-07-21 NOTE — Patient Instructions (Signed)
[place] at [facility] Discharge Instructions  You were seen today by Dr. Delton Coombes. He went over your recent results. Continue taking Zytiga 1,000 mg daily on an empty stomach. Dr. Delton Coombes will see you back in 5 weeks for labs and follow up.   Thank you for choosing [place] at [facility] to provide your oncology and hematology care.  To afford each patient quality time with our provider, please arrive at least 15 minutes before your scheduled appointment time.   If you have a lab appointment with the [facility] please come in thru the Main Entrance and check in at the main information desk  You need to re-schedule your appointment should you arrive 10 or more minutes late.  We strive to give you quality time with our providers, and arriving late affects you and other patients whose appointments are after yours.  Also, if you no show three or more times for appointments you may be dismissed from the clinic at the providers discretion.     Again, thank you for choosing [facility].  Our hope is that these requests will decrease the amount of time that you wait before being seen by our physicians.       _____________________________________________________________  Should you have questions after your visit to [facility], please contact our office at [phone] between the hours of 8:00 a.m. and 4:30 p.m.  Voicemails left after 4:00 p.m. will not be returned until the following business day.  For prescription refill requests, have your pharmacy contact our office and allow 72 hours.    Cancer Center Support Programs:   > Cancer Support Group  2nd Tuesday of the month 1pm-2pm, Journey Room

## 2020-07-21 NOTE — Progress Notes (Signed)
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 TURP on 11/10/2019. 2. Docetaxel x 6 cycles from 11/16/2019 to 02/29/2020.  NGS Results: Not done  CURRENT THERAPY: Zytiga 1,000 mg daily  BRIEF ONCOLOGIC HISTORY:  Oncology History  Prostate cancer metastatic to multiple sites [facility])  01/25/2017 Initial Diagnosis   Prostate cancer metastatic to multiple sites [facility])   11/16/2019 -  Chemotherapy   The patient had ondansetron (ZOFRAN) injection 8 mg, 8 mg (100 % of original dose 8 mg), Intravenous,  Once, 6 of 6 cycles Dose modification: 8 mg (original dose 8 mg, Cycle 1) pegfilgrastim-jmdb (FULPHILA) injection 6 mg, 6 mg, Subcutaneous,  Once, 6 of 6 cycles Administration: 6 mg (11/18/2019), 6 mg (12/09/2019), 6 mg (12/30/2019), 6 mg (01/20/2020), 6 mg (02/10/2020) ondansetron (ZOFRAN) 8 mg in sodium chloride 0.9 % 50 mL IVPB, , Intravenous,  Once, 4 of 4 cycles Administration: 8 mg (12/28/2019), 8 mg (01/18/2020), 8 mg (02/29/2020) DOCEtaxel (TAXOTERE) 140 mg in sodium chloride 0.9 % 250 mL chemo infusion, 75 mg/m2 = 140 mg, Intravenous,  Once, 6 of 6 cycles Administration: 140 mg (11/16/2019), 140 mg (12/07/2019), 140 mg (12/28/2019), 140 mg (01/18/2020), 110 mg (02/08/2020), 110 mg (02/29/2020)  for chemotherapy treatment.     Genetic Testing   Negative genetic testing. No pathogenic variants identified on the [facility] CancerNext-Expanded panel. VUS in [place] identified called [id number]. The report date is 04/19/2020.  The CancerNext-Expanded  gene panel offered by [facility] and includes sequencing and rearrangement analysis for the following 77 genes: IP, ALK, APC*, ATM*, AXIN2, BAP1, BARD1, BLM, BMPR1A, BRCA1*, BRCA2*, BRIP1*, CDC73,  CDH1*,CDK4, CDKN1B, CDKN2A, CHEK2*, CTNNA1, DICER1, FANCC, FH, FLCN, GALNT12, KIF1B, LZTR1, MAX, MEN1, MET, MLH1*, MSH2*, MSH3, MSH6*, MUTYH*, NBN, NF1*, NF2, NTHL1, PALB2*, PHOX2B, PMS2*, POT1, PRKAR1A, PTCH1, PTEN*, RAD51C*, RAD51D*,RB1, RECQL, RET, SDHA, SDHAF2, SDHB, SDHC, SDHD, SMAD4, SMARCA4, SMARCB1, SMARCE1, STK11, SUFU, TMEM127, TP53*,TSC1, TSC2, VHL and XRCC2 (sequencing and deletion/duplication); EGFR, EGLN1, HOXB13, KIT, MITF, PDGFRA, POLD1 and POLE (sequencing only); EPCAM and GREM1 (deletion/duplication only).      CANCER STAGING: Cancer Staging No matching staging information was found for the patient.  INTERVAL HISTORY:  Scott [name], a 65 y.o. 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 Scott [name] was last seen on 06/16/2020.   Today he reports feeling well. He reports that he started Zytiga 1,000 mg on 04/08 and is tolerating it well; his back pain has greatly improved since starting Zytiga. He went to radiation oncology on 05/03 to discuss radiation to his L1-L2, which will require 10 low dose treatments to each lesion. He reports that his urine has become cloudy over the past 2 weeks. He notes that he has had a low pulse rate since at least 2012, but denies having any dizziness or palpitations, though he reports having mild occasional lightheadedness. He denies having dysuria.   REVIEW OF SYSTEMS:  Review of Systems  Constitutional: Positive for fatigue (50%). Negative for appetite change.  Respiratory: Positive for shortness of breath.   Cardiovascular: Negative for palpitations.  Genitourinary: Positive for difficulty urinating (cloudy urine). Negative for dysuria.   Musculoskeletal: Negative for back pain (improved).  Neurological: Positive for light-headedness (mild occasionally). Negative for  dizziness.  All other systems reviewed and are negative.   PAST MEDICAL/SURGICAL HISTORY:  Past Medical History:   Diagnosis Date  . Bladder obstruction   . Complication of anesthesia   . Diverticulitis 2015  . Dyspnea   . Essential hypertension, benign 01/26/2019   at [place] office elevated, normal at home, no medications at this time  . History of basal cell cancer   . HLD (hyperlipidemia) 01/26/2019  . Hypothyroidism    history of, not currently taking medication  . PONV (postoperative nausea and vomiting)   . Prostate cancer ([place])    metastatic to bones  . Vitamin D deficiency disease 01/26/2019   Past Surgical History:  Procedure Laterality Date  . CYSTOSCOPY W/ URETERAL STENT PLACEMENT Left 11/10/2019   Procedure: CYSTOSCOPY;  Surgeon: Irine Seal, MD;  Location: [facility];  Service: Urology;  Laterality: Left;  . ESOPHAGOGASTRODUODENOSCOPY (EGD) WITH PROPOFOL N/A 07/07/2020   Procedure: ESOPHAGOGASTRODUODENOSCOPY (EGD) WITH PROPOFOL;  Surgeon: Daneil Dolin, MD;  Location: [facility];  Service: Endoscopy;  Laterality: N/A;  pm appt  . HERNIA REPAIR  2725   UMBILICAL   . MALONEY DILATION N/A 07/07/2020   Procedure: Venia Minks DILATION;  Surgeon: Daneil Dolin, MD;  Location: [facility];  Service: Endoscopy;  Laterality: N/A;  . PROSTATE SURGERY    . SKIN CANCER EXCISION    . TONSILLECTOMY Bilateral   . TOOTH EXTRACTION     front left tooth pulled but no surgery or stitches  . TRANSURETHRAL RESECTION OF PROSTATE N/A 01/25/2017   Procedure: TRANSURETHRAL RESECTION OF THE PROSTATE (TURP);  Surgeon: Alexis Frock, MD;  Location: [facility];  Service: Urology;  Laterality: N/A;  . TRANSURETHRAL RESECTION OF PROSTATE N/A 11/10/2019   Procedure: TRANSURETHRAL RESECTION OF THE PROSTATE (TURP);  Surgeon: Irine Seal, MD;  Location: [facility];  Service: Urology;  Laterality: N/A;  . WISDOM TOOTH EXTRACTION      SOCIAL HISTORY:  Social History   Socioeconomic History  . Marital status: Married    Spouse name: Not on file  . Number of children: 1  . Years of education: Not on file  . Highest  education level: Not on file  Occupational History    Comment: working full time  Tobacco Use  . Smoking status: Former Smoker    Packs/day: 1.50    Years: 35.00    Pack years: 52.50    Types: Cigarettes    Quit date: 01/12/2004    Years since quitting: 16.5  . Smokeless tobacco: Never Used  . Tobacco comment: OVER 30 YEARS HX OF SMOKING   Vaping Use  . Vaping Use: Never used  Substance and Sexual Activity  . Alcohol use: Not Currently    Comment: heavy drinker until 2004  . Drug use: Not Currently    Types: Marijuana  . Sexual activity: Not Currently  Other Topics Concern  . Not on file  Social History Narrative   Married for 19 years.Works for [employer] from home.   Social Determinants of Health   Financial Resource Strain: Low Risk   . Difficulty of Paying Living Expenses: Not hard at all  Food Insecurity: No Food Insecurity  . Worried About Charity fundraiser in the Last Year: Never true  . Ran Out of Food in the Last Year: Never true  Transportation Needs: No Transportation Needs  . Lack of Transportation (Medical): No  . Lack of Transportation (Non-Medical): No  Physical Activity: Inactive  . Days of  Exercise per Week: 0 days  . Minutes of Exercise per Session: 0 min  Stress: No Stress Concern Present  . Feeling of Stress : Only a little  Social Connections: Moderately Integrated  . Frequency of Communication with Friends and Family: Three times a week  . Frequency of Social Gatherings with Friends and Family: Never  . Attends Religious Services: Never  . Active Member of Clubs or Organizations: Yes  . Attends Archivist Meetings: 1 to 4 times per year  . Marital Status: Married  Human resources officer Violence: Not At Risk  . Fear of Current or Ex-Partner: No  . Emotionally Abused: No  . Physically Abused: No  . Sexually Abused: No    FAMILY HISTORY:  Family History  Problem Relation Age of Onset  . Emphysema Mother   . Alzheimer's  disease Father   . Prostate cancer Father        dx in his 77s  . Prostate cancer Brother        dx in his 51s  . Breast cancer Maternal Grandmother        23s  . Pancreatic cancer Maternal Grandfather        33s  . Dementia Paternal Grandmother   . Heart attack Paternal Grandfather   . Colon cancer Neg Hx     CURRENT MEDICATIONS:  Current Outpatient Medications  Medication Sig Dispense Refill  . abiraterone acetate (ZYTIGA) 250 MG tablet TAKE 4 TABLETS (1,000 MG TOTAL) DAILY. TAKE ON AN EMPTY STOMACH 1 HOUR BEFORE OR 2 HOURS AFTER A MEAL 120 tablet 0  . arginine 500 MG tablet Take 500 mg by mouth 2 (two) times daily.    . ASTRAGALUS PO Take 1 tablet by mouth 2 (two) times daily.    . Cholecalciferol 125 MCG (5000 UT) capsule Take 5,000-10,000 Units by mouth See admin instructions. Take 10000 units in the morning and 5000 units at night    . cimetidine (TAGAMET) 200 MG tablet Take 200-400 mg by mouth daily as needed (inflammation).     . DANDELION ROOT PO Take 500 mg by mouth daily.    Marland Kitchen dutasteride (AVODART) 0.5 MG capsule Take 1 capsule (0.5 mg total) by mouth daily. 30 capsule 11  . estradiol (ESTRACE) 2 MG tablet Take 2 tablets (4 mg total) by mouth daily. (Patient taking differently: Take 2 mg by mouth daily.) 60 tablet 3  . ibuprofen (ADVIL) 200 MG tablet Take 400 mg by mouth every 6 (six) hours as needed for moderate pain.    . IRON HEME POLYPEPTIDE PO Take 1 tablet by mouth every other day.    Marland Kitchen Leuprolide Acetate, 6 Month, (LUPRON DEPOT, 74-MONTH,) 45 MG injection Inject 45 mg into the muscle every 6 (six) months.    Marland Kitchen MAGNESIUM PO Take 1 tablet by mouth at bedtime.    Marland Kitchen MELATONIN PO Take 40 mg by mouth at bedtime.    Marland Kitchen MILK THISTLE PO Take 1 capsule by mouth daily.    . Misc Natural Products (TURMERIC CURCUMIN) CAPS Take 1 capsule by mouth 2 (two) times daily.    Marland Kitchen NATTOKINASE PO Take 2 capsules by mouth daily.    . NP THYROID 90 MG tablet Take 1 tablet (90 mg total) by  mouth daily. 30 tablet 3  . OVER THE COUNTER MEDICATION Take 1 capsule by mouth 2 (two) times daily. [place] [employer]  Anti-Cancer /Immune Scientist, product/process development    . OVER THE COUNTER MEDICATION Take 1 capsule by  mouth daily. Gamma E otc supplement    . OVER THE COUNTER MEDICATION Take 1 capsule by mouth 2 (two) times daily. zyflamend otc supplement    . pantoprazole (PROTONIX) 40 MG tablet Take 1 tablet by mouth daily.    . predniSONE (DELTASONE) 5 MG tablet Take 1 tablet (5 mg total) by mouth daily with breakfast. 30 tablet 6  . QUERCETIN PO Take 1 tablet by mouth 2 (two) times daily.    . traMADol (ULTRAM) 50 MG tablet Take 1 tablet (50 mg total) by mouth every 12 (twelve) hours as needed. 30 tablet 0  . VITAMIN K PO Take 1 capsule by mouth every other day.    . Zinc 30 MG CAPS Take 30 mg by mouth daily.     No current facility-administered medications for this visit.   Facility-Administered Medications Ordered in Other Visits  Medication Dose Route Frequency Provider Last Rate Last Admin  . heparin lock flush 100 unit/mL  500 Units Intracatheter Once PRN [name], MD      . sodium chloride flush (NS) 0.9 % injection 10 mL  10 mL Intracatheter PRN [name], MD        ALLERGIES:  Allergies  Allergen Reactions  . Ivp Dye [Iodinated Diagnostic Agents] Swelling    Swelling on head, took Benadryl and swelling reduced  . Wellbutrin [Bupropion] Hives    PHYSICAL EXAM:  Performance status (ECOG): 0 - Asymptomatic  Vitals:   07/21/20 1626  BP: (!) 143/71  Pulse: 61  Resp: 17  Temp: 98.6 F (37 C)  SpO2: 99%   Wt Readings from Last 3 Encounters:  07/21/20 179 lb 4 oz (81.3 kg)  07/07/20 174 lb 2.6 oz (79 kg)  07/06/20 175 lb (79.4 kg)   Physical Exam Vitals reviewed.  Constitutional:      Appearance: Normal appearance.  Cardiovascular:     Rate and Rhythm: Normal rate and regular rhythm.     Pulses: Normal pulses.     Heart sounds: Normal heart sounds.   Pulmonary:     Effort: Pulmonary effort is normal.     Breath sounds: Normal breath sounds.  Musculoskeletal:     Right lower leg: No edema.     Left lower leg: No edema.  Neurological:     General: No focal deficit present.     Mental Status: He is alert and oriented to person, place, and time.  Psychiatric:        Mood and Affect: Mood normal.        Behavior: Behavior normal.      LABORATORY DATA:  I have reviewed the labs as listed.  CBC Latest Ref Rng & Units 07/21/2020 04/28/2020 03/22/2020  WBC 4.0 - 10.5 K/uL 7.5 5.9 8.8  Hemoglobin 13.0 - 17.0 g/dL 13.0 12.2(L) 11.5(L)  Hematocrit 39.0 - 52.0 % 37.2(L) 36.4(L) 33.7(L)  Platelets 150 - 400 K/uL 261 249 265   CMP Latest Ref Rng & Units 07/21/2020 06/03/2020 04/28/2020  Glucose 70 - 99 mg/dL 117(H) - 102(H)  BUN 8 - 23 mg/dL 19 - 18  Creatinine 0.61 - 1.24 mg/dL 0.72 0.80 0.80  Sodium 135 - 145 mmol/L 135 - 131(L)  Potassium 3.5 - 5.1 mmol/L 4.0 - 4.0  Chloride 98 - 111 mmol/L 105 - 103  CO2 22 - 32 mmol/L 24 - 22  Calcium 8.9 - 10.3 mg/dL 8.9 - 8.7(L)  Total Protein 6.5 - 8.1 g/dL 6.6 - 6.5  Total Bilirubin 0.3 - 1.2  mg/dL 0.7 - 0.6  Alkaline Phos 38 - 126 U/L 140(H) - 49  AST 15 - 41 U/L 13(L) - 13(L)  ALT 0 - 44 U/L 11 - 10    DIAGNOSTIC IMAGING:  I have independently reviewed the scans and discussed with the patient. No results found.   ASSESSMENT:  1. 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 Prominent left hydronephrosis and hydroureter noted. New sclerotic lesion in the left anterior vertebral body of L1. Mild left periaortic adenopathy increased from prior, short axis lymph node measuring 1 cm. -Last PSA increased to 15.8 on 09/28/2019. -As there is rapid progression of disease, I have recommended docetaxel chemotherapy. He is agreeable after long  discussion. -Cystoscopy with transurethral resection of tumor in the posterior bladder neck, right and left walls of the bladder, right lobe of the prostate on 11/10/2019. -Plan was to do 6 cycles of docetaxel followed by abiraterone and prednisone. -6 cycles of docetaxel from 11/16/2019 through 02/29/2020. -CTAP on 01/06/2020 showed persistent thickening of the urinary bladder wall. Left hydronephrosis is significantly reduced compared to prior exam. Left-sided retroperitoneal and left iliac lymph nodes reduced in size. Interval increase in sclerosis of lesions of the left aspect of L2 vertebral body consistent with treatment response. No new bone lesions. -Bone scan on 01/06/2020 shows stable L2 metastatic focus. -Bone scan scan images from 06/14/2020 which showed progressive metastatic disease, with increased size and intensity of L2 1 new focus of activity at L1. 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  2. Left hydroureteronephrosis: -CT scan showed prostate mass invading the posterior bladder and obstructing the ureter. Creatinine increased to 1.1. -Renal ultrasound on 12/11/2019 showed interval resolution of previously seen left-sided hydronephrosis. Previously seen soft tissue mass at the posterior bladder lumen no longer seen.   PLAN:  1. 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 - He has tolerated it quite well so far.  No major side effects reported.  Very good improvement in the pain reported. - Reviewed labs from today which showed normal CBC.  LFTs were normal except alk phos was elevated at 140.  We will closely monitor. - Continue Abiraterone at the same dose with prednisone.  We will reevaluate him in 5 weeks and repeat PSA at that time. - He has met with Dr. 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 - He reported heart rate dropping down to as low as 50.  Does not report any  dizziness while it happens.  He tells me that he had low heart rate for last 10 years.  He is not on any beta-blockers.  If it drops below 50, I have recommended cardiology evaluation.  2.  Left hydroureteronephrosis: -Most recent scan showed complete resolution.  3. Hot flashes: -He takes estradiol 1 mg tablet prescribed by Dr. Anastasio Champion.  4. Peripheral neuropathy: -Left foot neuropathy is stable.  Occasional neuropathy in the fingertips.  5.  Low back pain: -This is from bone lesions at L1 and L2. - His bone pain has completely resolved in the last couple of weeks since Zytiga was started. - He is not requiring any tramadol.   Orders placed this encounter:  Orders Placed This Encounter  Procedures  . CBC with Differential/Platelet  . Comprehensive metabolic panel  .  PSA     [name], MD [place] [phone]   I, Milinda Antis, am acting as a scribe for Dr. Sanda Linger.  I, [name] MD, have reviewed the above documentation for accuracy and completeness, and I agree with the above.

## 2020-07-26 ENCOUNTER — Other Ambulatory Visit: Payer: Self-pay

## 2020-07-26 ENCOUNTER — Ambulatory Visit
Admission: RE | Admit: 2020-07-26 | Discharge: 2020-07-26 | Disposition: A | Discharge status: Home / Self Care | Payer: BC Managed Care – PPO | Source: Ambulatory Visit | Attending: Radiation Oncology | Admitting: Radiation Oncology

## 2020-07-26 DIAGNOSIS — C61 Malignant neoplasm of prostate: Secondary | ICD-10-CM | POA: Diagnosis not present

## 2020-07-26 DIAGNOSIS — C7951 Secondary malignant neoplasm of bone: Secondary | ICD-10-CM | POA: Insufficient documentation

## 2020-07-26 DIAGNOSIS — Z510 Encounter for antineoplastic radiation therapy: Secondary | ICD-10-CM | POA: Diagnosis not present

## 2020-07-26 NOTE — Progress Notes (Signed)
  Radiation Oncology         [phone] ________________________________  Name: Scott [name] MRN: [id number]  Date: 07/26/2020  DOB: 12/01/1955  SIMULATION AND TREATMENT PLANNING NOTE    ICD-10-CM   1. Malignant neoplasm of prostate metastatic to bone ([place])  C61    C79.51     DIAGNOSIS:  65 y.o. patient with lumbar L1, L2 metastasis  NARRATIVE:  The patient was brought to the [facility].  Identity was confirmed.  All relevant records and images related to the planned course of therapy were reviewed.  The patient freely provided informed written consent to proceed with treatment after reviewing the details related to the planned course of therapy. The consent form was witnessed and verified by the simulation staff.  Then, the patient was set-up in a stable reproducible  supine position for radiation therapy.  CT images were obtained.  Surface markings were placed.  The CT images were loaded into the planning software.  Then the target and avoidance structures were contoured including kidneys.  Treatment planning then occurred.  The radiation prescription was entered and confirmed.  Then, I designed and supervised the construction of a total of 3 medically necessary complex treatment devices with VacLoc positioner and 2 MLCs to shield kidneys.  I have requested : 3D Simulation  I have requested a DVH of the following structures: Left Kidney, Right Kidney and target.  PLAN:  The patient will receive 30 Gy in 10 fractions.  ________________________________  Sheral Apley Tammi Klippel, M.D.

## 2020-07-27 ENCOUNTER — Other Ambulatory Visit (HOSPITAL_COMMUNITY): Payer: Self-pay | Admitting: Hematology

## 2020-07-27 DIAGNOSIS — C61 Malignant neoplasm of prostate: Secondary | ICD-10-CM

## 2020-07-28 DIAGNOSIS — C7951 Secondary malignant neoplasm of bone: Secondary | ICD-10-CM | POA: Diagnosis not present

## 2020-08-01 ENCOUNTER — Other Ambulatory Visit: Payer: Self-pay

## 2020-08-01 ENCOUNTER — Ambulatory Visit (INDEPENDENT_AMBULATORY_CARE_PROVIDER_SITE_OTHER): Payer: BC Managed Care – PPO | Admitting: Internal Medicine

## 2020-08-01 ENCOUNTER — Encounter (INDEPENDENT_AMBULATORY_CARE_PROVIDER_SITE_OTHER): Payer: Self-pay | Admitting: Internal Medicine

## 2020-08-01 ENCOUNTER — Ambulatory Visit
Admission: RE | Admit: 2020-08-01 | Discharge: 2020-08-01 | Disposition: A | Discharge status: Home / Self Care | Payer: BC Managed Care – PPO | Source: Ambulatory Visit | Attending: Radiation Oncology | Admitting: Radiation Oncology

## 2020-08-01 VITALS — BP 121/78 | HR 53 | Temp 97.3°F | Resp 18 | Ht 66.0 in | Wt 177.5 lb

## 2020-08-01 DIAGNOSIS — R5381 Other malaise: Secondary | ICD-10-CM

## 2020-08-01 DIAGNOSIS — E782 Mixed hyperlipidemia: Secondary | ICD-10-CM | POA: Diagnosis not present

## 2020-08-01 DIAGNOSIS — R5383 Other fatigue: Secondary | ICD-10-CM | POA: Diagnosis not present

## 2020-08-01 DIAGNOSIS — C7951 Secondary malignant neoplasm of bone: Secondary | ICD-10-CM | POA: Diagnosis not present

## 2020-08-01 NOTE — Progress Notes (Signed)
Metrics: Intervention Frequency ACO  Documented Smoking Status Yearly  Screened one or more times in 24 months  Cessation Counseling or  Active cessation medication Past 24 months  Past 24 months   Guideline developer: UpToDate (See UpToDate for funding source) Date Released: 2014       Wellness Office Visit  Subjective:  Patient ID: Scott [name], male    DOB: 1955/08/03  Age: 65 y.o. MRN: [id number]  CC: This man comes in for follow-up of insulin resistance, dyslipidemia, malaise and fatigue in the setting of metastatic prostate cancer. HPI  He has been taking the higher dose of NP thyroid 90 mg daily and has tolerated it well. He has reduced his dose of estradiol to 2 mg daily as he was somewhat concerned about possible side effects. He is now undergoing further chemotherapy and radiotherapy for his metastatic prostate cancer to bone. His testosterone levels were documented as undetectable in March of this year.  His PSA has been going up. Past Medical History:  Diagnosis Date  . Bladder obstruction   . Complication of anesthesia   . Diverticulitis 2015  . Dyspnea   . Essential hypertension, benign 01/26/2019   at [place] office elevated, normal at home, no medications at this time  . History of basal cell cancer   . HLD (hyperlipidemia) 01/26/2019  . Hypothyroidism    history of, not currently taking medication  . PONV (postoperative nausea and vomiting)   . Prostate cancer ([place])    metastatic to bones  . Vitamin D deficiency disease 01/26/2019   Past Surgical History:  Procedure Laterality Date  . CYSTOSCOPY W/ URETERAL STENT PLACEMENT Left 11/10/2019   Procedure: CYSTOSCOPY;  Surgeon: Irine Seal, MD;  Location: [facility];  Service: Urology;  Laterality: Left;  . ESOPHAGOGASTRODUODENOSCOPY (EGD) WITH PROPOFOL N/A 07/07/2020   Procedure: ESOPHAGOGASTRODUODENOSCOPY (EGD) WITH PROPOFOL;  Surgeon: Daneil Dolin, MD;  Location: [facility];  Service: Endoscopy;  Laterality:  N/A;  pm appt  . HERNIA REPAIR  [id number]   UMBILICAL   . MALONEY DILATION N/A 07/07/2020   Procedure: Venia Minks DILATION;  Surgeon: Daneil Dolin, MD;  Location: [facility];  Service: Endoscopy;  Laterality: N/A;  . PROSTATE SURGERY    . SKIN CANCER EXCISION    . TONSILLECTOMY Bilateral   . TOOTH EXTRACTION     front left tooth pulled but no surgery or stitches  . TRANSURETHRAL RESECTION OF PROSTATE N/A 01/25/2017   Procedure: TRANSURETHRAL RESECTION OF THE PROSTATE (TURP);  Surgeon: Alexis Frock, MD;  Location: [facility];  Service: Urology;  Laterality: N/A;  . TRANSURETHRAL RESECTION OF PROSTATE N/A 11/10/2019   Procedure: TRANSURETHRAL RESECTION OF THE PROSTATE (TURP);  Surgeon: Irine Seal, MD;  Location: [facility];  Service: Urology;  Laterality: N/A;  . WISDOM TOOTH EXTRACTION       Family History  Problem Relation Age of Onset  . Emphysema Mother   . Alzheimer's disease Father   . Prostate cancer Father        dx in his 82s  . Prostate cancer Brother        dx in his 78s  . Breast cancer Maternal Grandmother        46s  . Pancreatic cancer Maternal Grandfather        32s  . Dementia Paternal Grandmother   . Heart attack Paternal Grandfather   . Colon cancer Neg Hx     Social History   Social History Narrative   Married for  19 years.Works for [employer] from home.   Social History   Tobacco Use  . Smoking status: Former Smoker    Packs/day: 1.50    Years: 35.00    Pack years: 52.50    Types: Cigarettes    Quit date: 01/12/2004    Years since quitting: 16.5  . Smokeless tobacco: Never Used  . Tobacco comment: OVER 30 YEARS HX OF SMOKING   Substance Use Topics  . Alcohol use: Not Currently    Comment: heavy drinker until 2004    Current Meds  Medication Sig  . abiraterone acetate (ZYTIGA) 250 MG tablet TAKE 4 TABLETS (1,000 MG TOTAL) DAILY. TAKE ON AN EMPTY STOMACH 1 HOUR BEFORE OR 2 HOURS AFTER A MEAL  . arginine 500 MG tablet Take 500 mg by mouth 2  (two) times daily.  . ASTRAGALUS PO Take 1 tablet by mouth 2 (two) times daily.  . Cholecalciferol 125 MCG (5000 UT) capsule Take 5,000-10,000 Units by mouth See admin instructions. Take 10000 units in the morning and 5000 units at night  . cimetidine (TAGAMET) 200 MG tablet Take 200-400 mg by mouth daily as needed (inflammation).   . DANDELION ROOT PO Take 500 mg by mouth daily.  Marland Kitchen dutasteride (AVODART) 0.5 MG capsule Take 1 capsule (0.5 mg total) by mouth daily.  Marland Kitchen estradiol (ESTRACE) 2 MG tablet Take 2 tablets (4 mg total) by mouth daily. (Patient taking differently: Take 2 mg by mouth daily.)  . ibuprofen (ADVIL) 200 MG tablet Take 400 mg by mouth every 6 (six) hours as needed for moderate pain.  . IRON HEME POLYPEPTIDE PO Take 1 tablet by mouth every other day.  Marland Kitchen Leuprolide Acetate, 6 Month, (LUPRON DEPOT, 43-MONTH,) 45 MG injection Inject 45 mg into the muscle every 6 (six) months.  Marland Kitchen MAGNESIUM PO Take 1 tablet by mouth at bedtime.  Marland Kitchen MELATONIN PO Take 40 mg by mouth at bedtime.  Marland Kitchen MILK THISTLE PO Take 1 capsule by mouth daily.  . Misc Natural Products (TURMERIC CURCUMIN) CAPS Take 1 capsule by mouth 2 (two) times daily.  Marland Kitchen NATTOKINASE PO Take 2 capsules by mouth daily.  . NP THYROID 90 MG tablet Take 1 tablet (90 mg total) by mouth daily.  Marland Kitchen OVER THE COUNTER MEDICATION Take 1 capsule by mouth 2 (two) times daily. [place] T[employer] Mushroom  Anti-Cancer /Immune Scientist, product/process development  . pantoprazole (PROTONIX) 40 MG tablet Take 1 tablet by mouth daily.  . predniSONE (DELTASONE) 5 MG tablet Take 1 tablet (5 mg total) by mouth daily with breakfast.  . QUERCETIN PO Take 1 tablet by mouth 2 (two) times daily.  Marland Kitchen VITAMIN K PO Take 1 capsule by mouth every other day.  . Zinc 30 MG CAPS Take 30 mg by mouth daily.     [place] Office Visit from 04/20/2020 in [facility]  PHQ-9 Total Score 0      Objective:   Today Exam l vitals: BP 121/78 (BP Location: Right Arm, Patient Position: Sitting,  Cuff Size: Normal)   Pulse (!) 53   Temp (!) 97.3 F (36.3 C) (Temporal)   Resp 18   Ht 5\' 6"  (1.676 m)   Wt 177 lb 8 oz (80.5 kg)   SpO2 98%   BMI 28.65 kg/m  Vitals with BMI 08/01/2020 07/21/2020 07/07/2020  Height 5\' 6"  - -  Weight 177 lbs 8 oz 179 lbs 4 oz -  BMI 78.29 56.21 -  Systolic 308 657 846  Diastolic 78  71 60  Pulse 53 61 57     Physical Exam  His weight is relatively stable.  Blood pressure is in good range.      Assessment   1. Malaise and fatigue   2. Mixed hyperlipidemia       Tests ordered Orders Placed This Encounter  Procedures  . T3, free  . TSH  . Lipid panel     Plan: 1. Continue with NP thyroid 90 mg daily and we will check thyroid function to see if we need to optimize further. 2. Check lipid panel. 3. He may be willing to go on higher dose estradiol next time as well as possibly obtaining testosterone therapy in the future. 4. Follow-up in 3 months.I spent 30 minutes with this patient discussing again the benefits of estradiol therapy and also testosterone therapy in prostate cancer.   No orders of the defined types were placed in this encounter.   Doree Albee, MD

## 2020-08-01 NOTE — Progress Notes (Signed)
Had a treatment this morning. Great bouts of nausea & vomiting.

## 2020-08-02 ENCOUNTER — Ambulatory Visit: Payer: BC Managed Care – PPO

## 2020-08-02 ENCOUNTER — Ambulatory Visit
Admission: RE | Admit: 2020-08-02 | Discharge: 2020-08-02 | Disposition: A | Discharge status: Home / Self Care | Payer: BC Managed Care – PPO | Source: Ambulatory Visit | Attending: Radiation Oncology | Admitting: Radiation Oncology

## 2020-08-02 DIAGNOSIS — C7951 Secondary malignant neoplasm of bone: Secondary | ICD-10-CM | POA: Diagnosis not present

## 2020-08-02 LAB — LIPID PANEL
Cholesterol: 191 mg/dL (ref <200)
HDL: 49 mg/dL (ref >=40)
LDL Cholesterol (Calc): 120 mg/dL (calc) — ABNORMAL HIGH
Non-HDL Cholesterol (Calc): 142 mg/dL (calc) — ABNORMAL HIGH (ref <130)
Total CHOL/HDL Ratio: 3.9 (calc) (ref <5.0)
Triglycerides: 112 mg/dL (ref <150)

## 2020-08-02 LAB — TSH: TSH: 0.4 mIU/L (ref 0.40–4.50)

## 2020-08-02 LAB — T3, FREE: T3, Free: 3.3 pg/mL (ref 2.3–4.2)

## 2020-08-03 ENCOUNTER — Other Ambulatory Visit: Payer: Self-pay

## 2020-08-03 ENCOUNTER — Telehealth: Payer: Self-pay | Admitting: Radiation Oncology

## 2020-08-03 ENCOUNTER — Ambulatory Visit
Admission: RE | Admit: 2020-08-03 | Discharge: 2020-08-03 | Disposition: A | Discharge status: Home / Self Care | Payer: BC Managed Care – PPO | Source: Ambulatory Visit | Attending: Radiation Oncology | Admitting: Radiation Oncology

## 2020-08-03 DIAGNOSIS — C7951 Secondary malignant neoplasm of bone: Secondary | ICD-10-CM | POA: Diagnosis not present

## 2020-08-03 NOTE — Telephone Encounter (Signed)
Received voicemail message from patient inquiring about lack of treatment marks and if his eye sight could be effected by the radiation. Phoned patient back. Explained his lumbar spine radiation is not related to his "eye sight being off."  Patient explains that Monday his long distance vision was blurry but seems improved today. Patient verbalizes his understanding that Zytiga could cause visual changes. I was unable to confirm Zytiga could cause visual changes. Patient denies floaters or diplopia. Will inform Dr. Raliegh Ip and Dr. Tammi Klippel of these findings. Patient questions if he is getting adequate treatment since he didn't receive planning tattoos. Assured patient he gets a low dose CT daily prior to treatment ensuring plan aligns. Patient verbalized understanding of all reviewed and appreciation for the call back. Patient denies further needs at this time.

## 2020-08-04 ENCOUNTER — Ambulatory Visit
Admission: RE | Admit: 2020-08-04 | Discharge: 2020-08-04 | Disposition: A | Discharge status: Home / Self Care | Payer: BC Managed Care – PPO | Source: Ambulatory Visit | Attending: Radiation Oncology | Admitting: Radiation Oncology

## 2020-08-04 ENCOUNTER — Other Ambulatory Visit: Payer: Self-pay

## 2020-08-04 DIAGNOSIS — C7951 Secondary malignant neoplasm of bone: Secondary | ICD-10-CM | POA: Diagnosis not present

## 2020-08-05 ENCOUNTER — Ambulatory Visit
Admission: RE | Admit: 2020-08-05 | Discharge: 2020-08-05 | Disposition: A | Discharge status: Home / Self Care | Payer: BC Managed Care – PPO | Source: Ambulatory Visit | Attending: Radiation Oncology | Admitting: Radiation Oncology

## 2020-08-05 DIAGNOSIS — C7951 Secondary malignant neoplasm of bone: Secondary | ICD-10-CM | POA: Diagnosis not present

## 2020-08-08 ENCOUNTER — Ambulatory Visit
Admission: RE | Admit: 2020-08-08 | Discharge: 2020-08-08 | Disposition: A | Discharge status: Home / Self Care | Payer: BC Managed Care – PPO | Source: Ambulatory Visit | Attending: Radiation Oncology | Admitting: Radiation Oncology

## 2020-08-08 ENCOUNTER — Other Ambulatory Visit: Payer: Self-pay

## 2020-08-08 DIAGNOSIS — C7951 Secondary malignant neoplasm of bone: Secondary | ICD-10-CM | POA: Diagnosis not present

## 2020-08-09 ENCOUNTER — Ambulatory Visit
Admission: RE | Admit: 2020-08-09 | Discharge: 2020-08-09 | Disposition: A | Discharge status: Home / Self Care | Payer: BC Managed Care – PPO | Source: Ambulatory Visit | Attending: Radiation Oncology | Admitting: Radiation Oncology

## 2020-08-09 DIAGNOSIS — C7951 Secondary malignant neoplasm of bone: Secondary | ICD-10-CM | POA: Diagnosis not present

## 2020-08-10 ENCOUNTER — Ambulatory Visit
Admission: RE | Admit: 2020-08-10 | Discharge: 2020-08-10 | Disposition: A | Discharge status: Home / Self Care | Payer: BC Managed Care – PPO | Source: Ambulatory Visit | Attending: Radiation Oncology | Admitting: Radiation Oncology

## 2020-08-10 ENCOUNTER — Other Ambulatory Visit: Payer: Self-pay

## 2020-08-10 DIAGNOSIS — C7951 Secondary malignant neoplasm of bone: Secondary | ICD-10-CM | POA: Diagnosis not present

## 2020-08-11 ENCOUNTER — Other Ambulatory Visit (INDEPENDENT_AMBULATORY_CARE_PROVIDER_SITE_OTHER): Payer: Self-pay | Admitting: Internal Medicine

## 2020-08-11 ENCOUNTER — Encounter (INDEPENDENT_AMBULATORY_CARE_PROVIDER_SITE_OTHER): Payer: Self-pay | Admitting: Internal Medicine

## 2020-08-11 ENCOUNTER — Ambulatory Visit
Admission: RE | Admit: 2020-08-11 | Discharge: 2020-08-11 | Disposition: A | Discharge status: Home / Self Care | Payer: BC Managed Care – PPO | Source: Ambulatory Visit | Attending: Radiation Oncology | Admitting: Radiation Oncology

## 2020-08-11 DIAGNOSIS — C7951 Secondary malignant neoplasm of bone: Secondary | ICD-10-CM | POA: Diagnosis not present

## 2020-08-11 MED ORDER — NP THYROID 120 MG PO TABS
120.0000 mg | ORAL_TABLET | Freq: Every day | ORAL | 3 refills | Status: DC
Start: 2020-08-11 — End: 2020-11-08

## 2020-08-12 ENCOUNTER — Encounter: Payer: Self-pay | Admitting: Radiation Oncology

## 2020-08-12 ENCOUNTER — Ambulatory Visit
Admission: RE | Admit: 2020-08-12 | Discharge: 2020-08-12 | Disposition: A | Discharge status: Home / Self Care | Payer: BC Managed Care – PPO | Source: Ambulatory Visit | Attending: Radiation Oncology | Admitting: Radiation Oncology

## 2020-08-12 ENCOUNTER — Other Ambulatory Visit: Payer: Self-pay

## 2020-08-12 DIAGNOSIS — C7951 Secondary malignant neoplasm of bone: Secondary | ICD-10-CM | POA: Diagnosis not present

## 2020-08-16 ENCOUNTER — Ambulatory Visit: Payer: BC Managed Care – PPO

## 2020-08-19 ENCOUNTER — Other Ambulatory Visit (INDEPENDENT_AMBULATORY_CARE_PROVIDER_SITE_OTHER): Payer: Self-pay | Admitting: Internal Medicine

## 2020-08-20 ENCOUNTER — Other Ambulatory Visit (HOSPITAL_COMMUNITY): Payer: Self-pay | Admitting: Hematology

## 2020-08-20 DIAGNOSIS — C61 Malignant neoplasm of prostate: Secondary | ICD-10-CM

## 2020-08-23 ENCOUNTER — Other Ambulatory Visit: Payer: Self-pay

## 2020-08-23 ENCOUNTER — Inpatient Hospital Stay (HOSPITAL_COMMUNITY): Payer: BC Managed Care – PPO | Attending: Hematology

## 2020-08-23 DIAGNOSIS — C61 Malignant neoplasm of prostate: Secondary | ICD-10-CM | POA: Diagnosis present

## 2020-08-23 DIAGNOSIS — C7951 Secondary malignant neoplasm of bone: Secondary | ICD-10-CM | POA: Diagnosis not present

## 2020-08-23 DIAGNOSIS — G629 Polyneuropathy, unspecified: Secondary | ICD-10-CM | POA: Diagnosis not present

## 2020-08-23 DIAGNOSIS — R232 Flushing: Secondary | ICD-10-CM | POA: Diagnosis not present

## 2020-08-23 DIAGNOSIS — M545 Low back pain, unspecified: Secondary | ICD-10-CM | POA: Diagnosis not present

## 2020-08-23 DIAGNOSIS — Z79899 Other long term (current) drug therapy: Secondary | ICD-10-CM | POA: Diagnosis not present

## 2020-08-23 DIAGNOSIS — Z923 Personal history of irradiation: Secondary | ICD-10-CM | POA: Insufficient documentation

## 2020-08-23 DIAGNOSIS — C779 Secondary and unspecified malignant neoplasm of lymph node, unspecified: Secondary | ICD-10-CM | POA: Diagnosis not present

## 2020-08-23 LAB — COMPREHENSIVE METABOLIC PANEL
ALT: 11 U/L (ref 0–44)
AST: 15 U/L (ref 15–41)
Albumin: 3.8 g/dL (ref 3.5–5.0)
Alkaline Phosphatase: 77 U/L (ref 38–126)
Anion gap: 6 (ref 5–15)
BUN: 14 mg/dL (ref 8–23)
CO2: 25 mmol/L (ref 22–32)
Calcium: 8.7 mg/dL — ABNORMAL LOW (ref 8.9–10.3)
Chloride: 104 mmol/L (ref 98–111)
Creatinine, Ser: 0.66 mg/dL (ref 0.61–1.24)
GFR, Estimated: >60 mL/min (ref >60)
Glucose, Bld: 104 mg/dL — ABNORMAL HIGH (ref 70–99)
Potassium: 3.9 mmol/L (ref 3.5–5.1)
Sodium: 135 mmol/L (ref 135–145)
Total Bilirubin: 1.1 mg/dL (ref 0.3–1.2)
Total Protein: 6.3 g/dL — ABNORMAL LOW (ref 6.5–8.1)

## 2020-08-23 LAB — CBC WITH DIFFERENTIAL/PLATELET
Abs Immature Granulocytes: 0.02 10*3/uL (ref 0.00–0.07)
Basophils Absolute: 0.1 10*3/uL (ref 0.0–0.1)
Basophils Relative: 2 %
Eosinophils Absolute: 0.2 10*3/uL (ref 0.0–0.5)
Eosinophils Relative: 4 %
HCT: 36.1 % — ABNORMAL LOW (ref 39.0–52.0)
Hemoglobin: 12.9 g/dL — ABNORMAL LOW (ref 13.0–17.0)
Immature Granulocytes: 0 %
Lymphocytes Relative: 20 %
Lymphs Abs: 0.9 10*3/uL (ref 0.7–4.0)
MCH: 32.4 pg (ref 26.0–34.0)
MCHC: 35.7 g/dL (ref 30.0–36.0)
MCV: 90.7 fL (ref 80.0–100.0)
Monocytes Absolute: 0.5 10*3/uL (ref 0.1–1.0)
Monocytes Relative: 10 %
Neutro Abs: 3 10*3/uL (ref 1.7–7.7)
Neutrophils Relative %: 64 %
Platelets: 176 10*3/uL (ref 150–400)
RBC: 3.98 MIL/uL — ABNORMAL LOW (ref 4.22–5.81)
RDW: 12.3 % (ref 11.5–15.5)
WBC: 4.7 10*3/uL (ref 4.0–10.5)
nRBC: 0 % (ref 0.0–0.2)

## 2020-08-23 LAB — PSA: Prostatic Specific Antigen: 2.13 ng/mL (ref 0.00–4.00)

## 2020-08-25 ENCOUNTER — Encounter (HOSPITAL_COMMUNITY): Payer: Self-pay | Admitting: Hematology

## 2020-08-29 ENCOUNTER — Telehealth (HOSPITAL_COMMUNITY): Payer: BC Managed Care – PPO | Admitting: Hematology

## 2020-08-30 ENCOUNTER — Other Ambulatory Visit: Payer: Self-pay

## 2020-08-30 ENCOUNTER — Encounter (HOSPITAL_COMMUNITY): Payer: Self-pay | Admitting: Hematology

## 2020-08-30 ENCOUNTER — Inpatient Hospital Stay (HOSPITAL_BASED_OUTPATIENT_CLINIC_OR_DEPARTMENT_OTHER): Payer: BC Managed Care – PPO | Admitting: Hematology

## 2020-08-30 DIAGNOSIS — C61 Malignant neoplasm of prostate: Secondary | ICD-10-CM

## 2020-08-30 NOTE — Progress Notes (Signed)
Virtual Visit via Telephone Note  I connected with Scott [name] on 08/30/20 at  4:15 PM EDT by telephone and verified that I am speaking with the correct person using two identifiers.  Location: Patient: At home Provider: In office   I discussed the limitations, risks, security and privacy concerns of performing an evaluation and management service by telephone and the availability of in person appointments. I also discussed with the patient that there may be a patient responsible charge related to this service. The patient expressed understanding and agreed to proceed.   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  He was started on abiraterone and prednisone on 06/24/2020.   Observations/Objective: He reports that he is having some hot flashes.  He is taking 2 mg of estradiol daily.  Denies any GI side effects including nausea vomiting or diarrhea.  Appetite is 100%.  Energy levels are 60%.  No new pains reported.  Assessment and Plan:  1.  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 - He is tolerating abiraterone and prednisone reasonably well. - We reviewed labs from 08/23/2020.  PSA improved to 2.13.  LFTs are normal.  Potassium was 3.9. - Continue Abiraterone at the same dose. - RTC 2 months with repeat labs and PSA.  2.  Hot flashes: - Continue estradiol 1 mg twice daily.  3.  Peripheral neuropathy: - He has very mild neuropathy in the left foot which is improving.  No medication needed.  4.  Low back pain: - This is from bone lesions at L1 and L2.  Pain completely resolved after Zytiga was started.  He is not requiring any tramadol.  5.  Bone strengthening: - He is not interested in bisphosphonates at this time.  Recommend calcium supplements.   Follow Up Instructions:  RTC 2 months for follow-up. I discussed the assessment and  treatment plan with the patient. The patient was provided an opportunity to ask questions and all were answered. The patient agreed with the plan and demonstrated an understanding of the instructions.   The patient was advised to call back or seek an in-person evaluation if the symptoms worsen or if the condition fails to improve as anticipated.  I provided 21 minutes of non-face-to-face time during this encounter.   [name], MD

## 2020-09-15 ENCOUNTER — Encounter (HOSPITAL_COMMUNITY): Payer: Self-pay | Admitting: Hematology

## 2020-09-15 NOTE — Progress Notes (Signed)
  Radiation Oncology         [phone] ________________________________  Name: Scott [name] MRN: [id number]  Date: 08/12/2020  DOB: 20-Mar-1955  End of Treatment Note  Diagnosis:   65 y.o. 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     Indication for treatment:  Palliation       Radiation treatment dates:   5/16-5/27/22  Site/dose:   The spine from T12 to L3 inclusive received 30 Gy in 10 fractions  Beams/energy:   3D conformal radiaiton was used with 2 static gantry angles zero and 180 and a dynamic conformal arc using 6, 10 and 15 MV X-rays  Narrative: The patient tolerated radiation treatment relatively well.   His pain improved and he remained neuro intact.  Plan: The patient has completed radiation treatment. The patient will return to radiation oncology clinic for routine followup in one month. I advised him to call or return sooner if he has any questions or concerns related to his recovery or treatment. ________________________________  Sheral Apley. Tammi Klippel, M.D.

## 2020-09-19 ENCOUNTER — Other Ambulatory Visit (HOSPITAL_COMMUNITY): Payer: Self-pay | Admitting: Hematology

## 2020-09-19 DIAGNOSIS — C61 Malignant neoplasm of prostate: Secondary | ICD-10-CM

## 2020-09-20 ENCOUNTER — Encounter (HOSPITAL_COMMUNITY): Payer: Self-pay | Admitting: Hematology

## 2020-09-20 ENCOUNTER — Other Ambulatory Visit (HOSPITAL_COMMUNITY): Payer: Self-pay | Admitting: *Deleted

## 2020-09-20 DIAGNOSIS — C61 Malignant neoplasm of prostate: Secondary | ICD-10-CM

## 2020-09-20 MED ORDER — ABIRATERONE ACETATE 250 MG PO TABS: ORAL_TABLET | ORAL | 1 refills | Status: DC

## 2020-09-20 NOTE — Telephone Encounter (Signed)
Chart reviewed, patient will continue same dose of abiraterone.  Refills sent to [facility].

## 2020-09-27 ENCOUNTER — Telehealth: Payer: Self-pay | Admitting: *Deleted

## 2020-09-27 NOTE — Progress Notes (Signed)
Radiation Oncology         [phone] ________________________________  Name: Scott [name] MRN: [id number]  Date: 09/28/2020  DOB: [birth year]  Post Treatment Note  CC: Doree Albee, MD  Irine Seal, MD  Diagnosis:   65 y.o. 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  Interval Since Last Radiation:  6.5 weeks  5/16-5/27/22:  The spine from T12 to L3 inclusive received 30 Gy in 10 fractions  Narrative:  I spoke with the patient to conduct his routine scheduled 1 month follow up visit via telephone to spare the patient unnecessary potential exposure in the healthcare setting during the current COVID-19 pandemic.  The patient was notified in advance and gave permission to proceed with this visit format.  He tolerated radiation treatment relatively well.   His pain improved and he remained neuro intact.                              On review of systems, the patient states he is doing very well in general and is currently without complaints.  He has some mild residual fatigue and has noticed a new mole on his stomach that is dark brown in coloration.  He was not aware of this mole prior to his radiation treatments.  Otherwise, he denies any skin irritation or changes.  He reports a healthy appetite and is maintaining his weight.  He denies abdominal pain, nausea, vomiting, diarrhea or constipation.  Overall, he is quite pleased with his progress to date.  ALLERGIES:  is allergic to ivp dye [iodinated diagnostic agents] and wellbutrin [bupropion].  Meds: Current Outpatient Medications  Medication Sig Dispense Refill   abiraterone acetate (ZYTIGA) 250 MG tablet TAKE 4 TABLETS DAILY ON AN EMPTY STOMACH, 1 HOUR BEFORE OR 2 HOURS AFTER A MEAL. 120 tablet 1   arginine 500 MG tablet Take 500 mg by mouth 2 (two) times daily.     ASTRAGALUS PO Take 1 tablet  by mouth 2 (two) times daily.     Cholecalciferol 125 MCG (5000 UT) capsule Take 5,000-10,000 Units by mouth See admin instructions. Take 10000 units in the morning and 5000 units at night     cimetidine (TAGAMET) 200 MG tablet Take 200-400 mg by mouth daily as needed (inflammation).      DANDELION ROOT PO Take 500 mg by mouth daily.     dutasteride (AVODART) 0.5 MG capsule Take 1 capsule (0.5 mg total) by mouth daily. 30 capsule 11   estradiol (ESTRACE) 2 MG tablet Take 2 tablets (4 mg total) by mouth daily. (Patient taking differently: Take 2 mg by mouth daily.) 60 tablet 3   ibuprofen (ADVIL) 200 MG tablet Take 400 mg by mouth every 6 (six) hours as needed for moderate pain.     IRON HEME POLYPEPTIDE PO Take 1 tablet by mouth every other day.     Leuprolide Acetate, 6 Month, (LUPRON DEPOT, 26-MONTH,) 45 MG injection Inject 45 mg into the muscle every 6 (six) months.     MAGNESIUM PO Take 1 tablet by mouth at bedtime.     MELATONIN PO Take 40 mg by mouth at bedtime.     MILK THISTLE PO Take 1 capsule by mouth daily.     Misc Natural Products (TURMERIC CURCUMIN) CAPS Take 1 capsule by mouth  2 (two) times daily.     NATTOKINASE PO Take 2 capsules by mouth daily.     NP THYROID 120 MG tablet Take 1 tablet (120 mg total) by mouth daily before breakfast. 30 tablet 3   OVER THE COUNTER MEDICATION Take 1 capsule by mouth 2 (two) times daily. [place] Tail Mushroom  Anti-Cancer /Immune Booster     OVER THE COUNTER MEDICATION Take 1 capsule by mouth daily. Gamma E otc supplement     OVER THE COUNTER MEDICATION Take 1 capsule by mouth 2 (two) times daily. zyflamend otc supplement     pantoprazole (PROTONIX) 40 MG tablet Take 1 tablet by mouth daily.     predniSONE (DELTASONE) 5 MG tablet Take 1 tablet (5 mg total) by mouth daily with breakfast. 30 tablet 6   QUERCETIN PO Take 1 tablet by mouth 2 (two) times daily.     traMADol (ULTRAM) 50 MG tablet Take 1 tablet (50 mg total) by mouth every 12 (twelve)  hours as needed. (Patient not taking: No sig reported) 30 tablet 0   VITAMIN K PO Take 1 capsule by mouth every other day.     Zinc 30 MG CAPS Take 30 mg by mouth daily.     No current facility-administered medications for this visit.   Facility-Administered Medications Ordered in Other Visits  Medication Dose Route Frequency Provider Last Rate Last Admin   heparin lock flush 100 unit/mL  500 Units Intracatheter Once PRN [name], MD       sodium chloride flush (NS) 0.9 % injection 10 mL  10 mL Intracatheter PRN [name], MD        Physical Findings:  vitals were not taken for this visit.   Karen Kays to assess due to telephone follow up visit format.  Lab Findings: Lab Results  Component Value Date   WBC 4.7 08/23/2020   HGB 12.9 (L) 08/23/2020   HCT 36.1 (L) 08/23/2020   MCV 90.7 08/23/2020   PLT 176 08/23/2020     Radiographic Findings: No results found.  Impression/Plan: 1. 65 y.o. 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 He appears to have recovered well from the effects of his recent radiotherapy and is currently without complaints aside from mild residual fatigue.  At his most recent follow-up visit with Dr. Delton Coombes on 08/30/2020 the plan was to continue Zytiga and prednisone concurrent with the Lupron ADT which he continues to tolerate fairly well.  We discussed that while we are happy to continue to participate in his care if clinically indicated, at this point, we will plan to see him back on an as-needed basis.  He will continue in routine follow-up under the care and direction of Dr. Delton Coombes for continued management of his systemic disease and has his next scheduled follow-up on 11/08/2020.  He will also continue in routine follow-up with Dr. Jeffie Pollock in urology, whom he will see on 12/01/2020.  We enjoyed  taking care of him and look forward to continuing to follow his progress via correspondence.  He knows that he is welcome to call at anytime with any questions or concerns related to his previous radiotherapy.    Nicholos Johns, PA-C

## 2020-09-27 NOTE — Telephone Encounter (Signed)
CALLED PATIENT TO INFORM THAT Scott [name] IS OUT SICK TODAY AND SHE WILL CALL HIM TOMORROW @ 1:30 PM, PATIENT VERIFIED UNDERSTANDING THIS

## 2020-09-27 NOTE — Progress Notes (Deleted)
Referring Provider: Doree Albee, MD Primary Care Physician:  Doree Albee, MD Primary GI Physician: Dr. Gala Romney  No chief complaint on file.   HPI:   Scott [name] is a 65 y.o. 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  Last seen in our office 05/30/2020.  Reported several year history of solid food and pill dysphagia. Rare reflux symptoms taking cimetidine for anticancer/anti-inflammatory effects rather than reflux.  Never had a colonoscopy.  Also discussed small, reducible periumbilical hernia.  No significant symptoms and patient not interested in surgical evaluation.  He was scheduled for EGD and colonoscopy but called to cancel his colonoscopy.  EGD 07/07/2020 with peptic esophageal stricture dilated, small hiatal hernia, normal examined duodenum.  Recommended starting Protonix 40 mg daily.  Today:  Dysphagia:   Colon cancer screening:  Past Medical History:  Diagnosis Date   Bladder obstruction    Complication of anesthesia    Diverticulitis 2015   Dyspnea    Essential hypertension, benign 01/26/2019   at [facility] elevated, normal at home, no medications at this time   History of basal cell cancer    HLD (hyperlipidemia) 01/26/2019   Hypothyroidism    history of, not currently taking medication   PONV (postoperative nausea and vomiting)    Prostate cancer ([place])    metastatic to bones   Vitamin D deficiency disease 01/26/2019    Past Surgical History:  Procedure Laterality Date   CYSTOSCOPY W/ URETERAL STENT PLACEMENT Left 11/10/2019   Procedure: CYSTOSCOPY;  Surgeon: Irine Seal, MD;  Location: [facility];  Service: Urology;  Laterality: Left;   ESOPHAGOGASTRODUODENOSCOPY (EGD) WITH PROPOFOL N/A 07/07/2020   Procedure: ESOPHAGOGASTRODUODENOSCOPY (EGD) WITH PROPOFOL;  Surgeon: Daneil Dolin, MD;  Location: [facility];  Service: Endoscopy;  Laterality: N/A;  pm appt   HERNIA REPAIR  [id number]   UMBILICAL    MALONEY DILATION N/A 07/07/2020   Procedure: MALONEY DILATION;  Surgeon: Daneil Dolin, MD;  Location: [facility];  Service: Endoscopy;  Laterality: N/A;   PROSTATE SURGERY     SKIN CANCER EXCISION     TONSILLECTOMY Bilateral    TOOTH EXTRACTION     front left tooth pulled but no surgery or stitches   TRANSURETHRAL RESECTION OF PROSTATE N/A 01/25/2017   Procedure: TRANSURETHRAL RESECTION OF THE PROSTATE (TURP);  Surgeon: Alexis Frock, MD;  Location: [facility];  Service: Urology;  Laterality: N/A;   TRANSURETHRAL RESECTION OF PROSTATE N/A 11/10/2019   Procedure: TRANSURETHRAL RESECTION OF THE PROSTATE (TURP);  Surgeon: Irine Seal, MD;  Location: [facility];  Service: Urology;  Laterality: N/A;   WISDOM TOOTH EXTRACTION      Current Outpatient Medications  Medication Sig Dispense Refill   abiraterone acetate (ZYTIGA) 250 MG tablet TAKE 4 TABLETS DAILY ON AN EMPTY STOMACH, 1 HOUR BEFORE OR 2 HOURS AFTER A MEAL. 120 tablet 1   arginine 500 MG tablet Take 500 mg by mouth 2 (two) times daily.     ASTRAGALUS PO Take 1 tablet by mouth 2 (two) times daily.     Cholecalciferol 125 MCG (5000 UT) capsule Take 5,000-10,000 Units by mouth See admin instructions. Take 10000 units in the morning and 5000 units at night     cimetidine (TAGAMET) 200 MG tablet Take 200-400 mg by mouth daily as needed (inflammation).      DANDELION  ROOT PO Take 500 mg by mouth daily.     dutasteride (AVODART) 0.5 MG capsule Take 1 capsule (0.5 mg total) by mouth daily. 30 capsule 11   estradiol (ESTRACE) 2 MG tablet Take 2 tablets (4 mg total) by mouth daily. (Patient taking differently: Take 2 mg by mouth daily.) 60 tablet 3   ibuprofen (ADVIL) 200 MG tablet Take 400 mg by mouth every 6 (six) hours as needed for moderate pain.     IRON HEME POLYPEPTIDE PO Take 1 tablet by mouth every other day.     Leuprolide Acetate, 6 Month,  (LUPRON DEPOT, 5-MONTH,) 45 MG injection Inject 45 mg into the muscle every 6 (six) months.     MAGNESIUM PO Take 1 tablet by mouth at bedtime.     MELATONIN PO Take 40 mg by mouth at bedtime.     MILK THISTLE PO Take 1 capsule by mouth daily.     Misc Natural Products (TURMERIC CURCUMIN) CAPS Take 1 capsule by mouth 2 (two) times daily.     NATTOKINASE PO Take 2 capsules by mouth daily.     NP THYROID 120 MG tablet Take 1 tablet (120 mg total) by mouth daily before breakfast. 30 tablet 3   OVER THE COUNTER MEDICATION Take 1 capsule by mouth 2 (two) times daily. [place] Mushroom  Anti-Cancer /Immune Booster     OVER THE COUNTER MEDICATION Take 1 capsule by mouth daily. Gamma E otc supplement     OVER THE COUNTER MEDICATION Take 1 capsule by mouth 2 (two) times daily. zyflamend otc supplement     pantoprazole (PROTONIX) 40 MG tablet Take 1 tablet by mouth daily.     predniSONE (DELTASONE) 5 MG tablet Take 1 tablet (5 mg total) by mouth daily with breakfast. 30 tablet 6   QUERCETIN PO Take 1 tablet by mouth 2 (two) times daily.     traMADol (ULTRAM) 50 MG tablet Take 1 tablet (50 mg total) by mouth every 12 (twelve) hours as needed. (Patient not taking: No sig reported) 30 tablet 0   VITAMIN K PO Take 1 capsule by mouth every other day.     Zinc 30 MG CAPS Take 30 mg by mouth daily.     No current facility-administered medications for this visit.   Facility-Administered Medications Ordered in Other Visits  Medication Dose Route Frequency Provider Last Rate Last Admin   heparin lock flush 100 unit/mL  500 Units Intracatheter Once PRN [name], MD       sodium chloride flush (NS) 0.9 % injection 10 mL  10 mL Intracatheter PRN [name], MD        Allergies as of 09/29/2020 - Review Complete 08/30/2020  Allergen Reaction Noted   Ivp dye [iodinated diagnostic agents] Swelling 06/14/2020   Wellbutrin [bupropion] Hives 12/18/2016    Family History  Problem Relation  Age of Onset   Emphysema Mother    Alzheimer's disease Father    Prostate cancer Father        dx in his 50s   Prostate cancer Brother        dx in his 27s   Breast cancer Maternal Grandmother        60s   Pancreatic cancer Maternal Grandfather        37s   Dementia Paternal Grandmother    Heart attack Paternal Grandfather    Colon cancer Neg Hx     Social History   Socioeconomic History   Marital status:  Married    Spouse name: Not on file   Number of children: 1   Years of education: Not on file   Highest education level: Not on file  Occupational History    Comment: working full time  Tobacco Use   Smoking status: Former    Packs/day: 1.50    Years: 35.00    Pack years: 52.50    Types: Cigarettes    Quit date: 01/12/2004    Years since quitting: 16.7   Smokeless tobacco: Never   Tobacco comments:    OVER 30 YEARS HX OF SMOKING   Vaping Use   Vaping Use: Never used  Substance and Sexual Activity   Alcohol use: Not Currently    Comment: heavy drinker until 2004   Drug use: Not Currently    Types: Marijuana   Sexual activity: Not Currently  Other Topics Concern   Not on file  Social History Narrative   Married for 19 years.Works for [employer] from home.   Social Determinants of Health   Financial Resource Strain: Low Risk    Difficulty of Paying Living Expenses: Not hard at all  Food Insecurity: No Food Insecurity   Worried About Charity fundraiser in the Last Year: Never true   [place] in the Last Year: Never true  Transportation Needs: No Transportation Needs   Lack of Transportation (Medical): No   Lack of Transportation (Non-Medical): No  Physical Activity: Inactive   Days of Exercise per Week: 0 days   Minutes of Exercise per Session: 0 min  Stress: No Stress Concern Present   Feeling of Stress : Only a little  Social Connections: Moderately Integrated   Frequency of Communication with Friends and Family: Three times a week    Frequency of Social Gatherings with Friends and Family: Never   Attends Religious Services: Never   Marine scientist or Organizations: Yes   Attends Music therapist: 1 to 4 times per year   Marital Status: Married    Review of Systems: Gen: Denies fever, chills, anorexia. Denies fatigue, weakness, weight loss.  CV: Denies chest pain, palpitations, syncope, peripheral edema, and claudication. Resp: Denies dyspnea at rest, cough, wheezing, coughing up blood, and pleurisy. GI: Denies vomiting blood, jaundice, and fecal incontinence.   Denies dysphagia or odynophagia. Derm: Denies rash, itching, dry skin Psych: Denies depression, anxiety, memory loss, confusion. No homicidal or suicidal ideation.  Heme: Denies bruising, bleeding, and enlarged lymph nodes.  Physical Exam: There were no vitals taken for this visit. General:   Alert and oriented. No distress noted. Pleasant and cooperative.  Head:  Normocephalic and atraumatic. Eyes:  Conjuctiva clear without scleral icterus. Mouth:  Oral mucosa pink and moist. Good dentition. No lesions. Heart:  S1, S2 present without murmurs appreciated. Lungs:  Clear to auscultation bilaterally. No wheezes, rales, or rhonchi. No distress.  Abdomen:  +BS, soft, non-tender and non-distended. No rebound or guarding. No HSM or masses noted. Msk:  Symmetrical without gross deformities. Normal posture. Extremities:  Without edema. Neurologic:  Alert and  oriented x4 Psych:  Alert and cooperative. Normal mood and affect.

## 2020-09-28 ENCOUNTER — Other Ambulatory Visit: Payer: Self-pay

## 2020-09-28 ENCOUNTER — Ambulatory Visit
Admission: RE | Admit: 2020-09-28 | Discharge: 2020-09-28 | Disposition: A | Discharge status: Home / Self Care | Payer: BC Managed Care – PPO | Source: Ambulatory Visit | Attending: Urology | Admitting: Urology

## 2020-09-28 DIAGNOSIS — C61 Malignant neoplasm of prostate: Secondary | ICD-10-CM

## 2020-09-28 DIAGNOSIS — C7951 Secondary malignant neoplasm of bone: Secondary | ICD-10-CM

## 2020-09-28 NOTE — Progress Notes (Signed)
Patient is aware that this is  a phone visit. Patients meaningful use is complete. Patient denies any dysuria ,hematuria,leakage or urgency. Patient reports nocturia x2.Patient states that he empties his blader most of the time. Patient reports a strong stream. Patient denies having to push or strain to start his stream. Patient reports that he void without having to stop. Patient denies any constipation.Patients next appointment with his urologist is 12/01/20. Patients I[place] was 4.

## 2020-09-29 ENCOUNTER — Telehealth: Payer: Self-pay | Admitting: Internal Medicine

## 2020-09-29 ENCOUNTER — Ambulatory Visit: Payer: BC Managed Care – PPO | Admitting: Gastroenterology

## 2020-09-29 NOTE — Telephone Encounter (Signed)
I am glad he is doing well! He needs his first ever colonoscopy. If he would like to get this scheduled, we will need to arrange an OV to discuss. If he is not ready to have his colonoscopy, he can call us when he would like to get it scheduled or if he develops any other GI concerns and we can arrange an OV at that time.

## 2020-09-29 NOTE — Telephone Encounter (Signed)
Patient called and said that he was doing good and cancelled his appointment, said that if you wanted him to come in he would reschedule

## 2020-09-29 NOTE — Telephone Encounter (Signed)
Lmom for pt. To call office back. 

## 2020-11-01 ENCOUNTER — Other Ambulatory Visit: Payer: Self-pay

## 2020-11-01 ENCOUNTER — Inpatient Hospital Stay (HOSPITAL_COMMUNITY): Payer: BC Managed Care – PPO | Attending: Hematology

## 2020-11-01 DIAGNOSIS — R5383 Other fatigue: Secondary | ICD-10-CM | POA: Insufficient documentation

## 2020-11-01 DIAGNOSIS — Z192 Hormone resistant malignancy status: Secondary | ICD-10-CM | POA: Insufficient documentation

## 2020-11-01 DIAGNOSIS — Z800 Family history of malignant neoplasm of digestive organs: Secondary | ICD-10-CM | POA: Insufficient documentation

## 2020-11-01 DIAGNOSIS — Z85828 Personal history of other malignant neoplasm of skin: Secondary | ICD-10-CM | POA: Diagnosis not present

## 2020-11-01 DIAGNOSIS — G629 Polyneuropathy, unspecified: Secondary | ICD-10-CM | POA: Insufficient documentation

## 2020-11-01 DIAGNOSIS — E559 Vitamin D deficiency, unspecified: Secondary | ICD-10-CM | POA: Insufficient documentation

## 2020-11-01 DIAGNOSIS — R232 Flushing: Secondary | ICD-10-CM | POA: Insufficient documentation

## 2020-11-01 DIAGNOSIS — C779 Secondary and unspecified malignant neoplasm of lymph node, unspecified: Secondary | ICD-10-CM | POA: Insufficient documentation

## 2020-11-01 DIAGNOSIS — Z9221 Personal history of antineoplastic chemotherapy: Secondary | ICD-10-CM | POA: Diagnosis not present

## 2020-11-01 DIAGNOSIS — Z79899 Other long term (current) drug therapy: Secondary | ICD-10-CM | POA: Diagnosis not present

## 2020-11-01 DIAGNOSIS — Z87891 Personal history of nicotine dependence: Secondary | ICD-10-CM | POA: Insufficient documentation

## 2020-11-01 DIAGNOSIS — E785 Hyperlipidemia, unspecified: Secondary | ICD-10-CM | POA: Diagnosis not present

## 2020-11-01 DIAGNOSIS — Z8042 Family history of malignant neoplasm of prostate: Secondary | ICD-10-CM | POA: Insufficient documentation

## 2020-11-01 DIAGNOSIS — Z803 Family history of malignant neoplasm of breast: Secondary | ICD-10-CM | POA: Insufficient documentation

## 2020-11-01 DIAGNOSIS — C61 Malignant neoplasm of prostate: Secondary | ICD-10-CM | POA: Diagnosis present

## 2020-11-01 DIAGNOSIS — Z8719 Personal history of other diseases of the digestive system: Secondary | ICD-10-CM | POA: Diagnosis not present

## 2020-11-01 DIAGNOSIS — Z1509 Genetic susceptibility to other malignant neoplasm: Secondary | ICD-10-CM | POA: Insufficient documentation

## 2020-11-01 DIAGNOSIS — C7951 Secondary malignant neoplasm of bone: Secondary | ICD-10-CM | POA: Insufficient documentation

## 2020-11-01 DIAGNOSIS — E039 Hypothyroidism, unspecified: Secondary | ICD-10-CM | POA: Insufficient documentation

## 2020-11-01 DIAGNOSIS — R635 Abnormal weight gain: Secondary | ICD-10-CM | POA: Diagnosis not present

## 2020-11-01 DIAGNOSIS — H539 Unspecified visual disturbance: Secondary | ICD-10-CM | POA: Diagnosis not present

## 2020-11-01 LAB — COMPREHENSIVE METABOLIC PANEL
ALT: 14 U/L (ref 0–44)
AST: 16 U/L (ref 15–41)
Albumin: 3.7 g/dL (ref 3.5–5.0)
Alkaline Phosphatase: 66 U/L (ref 38–126)
Anion gap: 7 (ref 5–15)
BUN: 16 mg/dL (ref 8–23)
CO2: 21 mmol/L — ABNORMAL LOW (ref 22–32)
Calcium: 8.7 mg/dL — ABNORMAL LOW (ref 8.9–10.3)
Chloride: 105 mmol/L (ref 98–111)
Creatinine, Ser: 0.69 mg/dL (ref 0.61–1.24)
GFR, Estimated: >60 mL/min (ref >60)
Glucose, Bld: 103 mg/dL — ABNORMAL HIGH (ref 70–99)
Potassium: 3.6 mmol/L (ref 3.5–5.1)
Sodium: 133 mmol/L — ABNORMAL LOW (ref 135–145)
Total Bilirubin: 1.1 mg/dL (ref 0.3–1.2)
Total Protein: 6.2 g/dL — ABNORMAL LOW (ref 6.5–8.1)

## 2020-11-01 LAB — CBC WITH DIFFERENTIAL/PLATELET
Abs Immature Granulocytes: 0.02 10*3/uL (ref 0.00–0.07)
Basophils Absolute: 0.1 10*3/uL (ref 0.0–0.1)
Basophils Relative: 1 %
Eosinophils Absolute: 0.1 10*3/uL (ref 0.0–0.5)
Eosinophils Relative: 2 %
HCT: 35.9 % — ABNORMAL LOW (ref 39.0–52.0)
Hemoglobin: 12.8 g/dL — ABNORMAL LOW (ref 13.0–17.0)
Immature Granulocytes: 0 %
Lymphocytes Relative: 25 %
Lymphs Abs: 1.3 10*3/uL (ref 0.7–4.0)
MCH: 32.2 pg (ref 26.0–34.0)
MCHC: 35.7 g/dL (ref 30.0–36.0)
MCV: 90.2 fL (ref 80.0–100.0)
Monocytes Absolute: 0.6 10*3/uL (ref 0.1–1.0)
Monocytes Relative: 11 %
Neutro Abs: 3.1 10*3/uL (ref 1.7–7.7)
Neutrophils Relative %: 61 %
Platelets: 206 10*3/uL (ref 150–400)
RBC: 3.98 MIL/uL — ABNORMAL LOW (ref 4.22–5.81)
RDW: 11.7 % (ref 11.5–15.5)
WBC: 5.2 10*3/uL (ref 4.0–10.5)
nRBC: 0 % (ref 0.0–0.2)

## 2020-11-01 LAB — PSA: Prostatic Specific Antigen: 0.89 ng/mL (ref 0.00–4.00)

## 2020-11-07 ENCOUNTER — Ambulatory Visit (INDEPENDENT_AMBULATORY_CARE_PROVIDER_SITE_OTHER): Payer: BC Managed Care – PPO | Admitting: Internal Medicine

## 2020-11-07 NOTE — Progress Notes (Signed)
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 TURP on 11/10/2019. 2. Docetaxel x 6 cycles from 11/16/2019 to 02/29/2020.  NGS Results: not done  CURRENT THERAPY:  Zytiga 1,000 mg daily  BRIEF ONCOLOGIC HISTORY:  Oncology History  Prostate cancer metastatic to multiple sites [facility])  01/25/2017 Initial Diagnosis   Prostate cancer metastatic to multiple sites [facility])   11/16/2019 -  Chemotherapy   The patient had ondansetron (ZOFRAN) injection 8 mg, 8 mg (100 % of original dose 8 mg), Intravenous,  Once, 6 of 6 cycles Dose modification: 8 mg (original dose 8 mg, Cycle 1) pegfilgrastim-jmdb (FULPHILA) injection 6 mg, 6 mg, Subcutaneous,  Once, 6 of 6 cycles Administration: 6 mg (11/18/2019), 6 mg (12/09/2019), 6 mg (12/30/2019), 6 mg (01/20/2020), 6 mg (02/10/2020) ondansetron (ZOFRAN) 8 mg in sodium chloride 0.9 % 50 mL IVPB, , Intravenous,  Once, 4 of 4 cycles Administration: 8 mg (12/28/2019), 8 mg (01/18/2020), 8 mg (02/29/2020) DOCEtaxel (TAXOTERE) 140 mg in sodium chloride 0.9 % 250 mL chemo infusion, 75 mg/m2 = 140 mg, Intravenous,  Once, 6 of 6 cycles Administration: 140 mg (11/16/2019), 140 mg (12/07/2019), 140 mg (12/28/2019), 140 mg (01/18/2020), 110 mg (02/08/2020), 110 mg (02/29/2020)   for chemotherapy treatment.      Genetic Testing   Negative genetic testing. No pathogenic variants identified on the [facility] CancerNext-Expanded panel. VUS in [place] identified called [id number]. The report date is 04/19/2020.  The CancerNext-Expanded  gene panel offered by [facility] and includes sequencing and rearrangement analysis for the following 77 genes: IP, ALK, APC*, ATM*, AXIN2, BAP1, BARD1, BLM, BMPR1A, BRCA1*, BRCA2*, BRIP1*, CDC73, CDH1*,CDK4, CDKN1B, CDKN2A,  CHEK2*, CTNNA1, DICER1, FANCC, FH, FLCN, GALNT12, KIF1B, LZTR1, MAX, MEN1, MET, MLH1*, MSH2*, MSH3, MSH6*, MUTYH*, NBN, NF1*, NF2, NTHL1, PALB2*, PHOX2B, PMS2*, POT1, PRKAR1A, PTCH1, PTEN*, RAD51C*, RAD51D*,RB1, RECQL, RET, SDHA, SDHAF2, SDHB, SDHC, SDHD, SMAD4, SMARCA4, SMARCB1, SMARCE1, STK11, SUFU, TMEM127, TP53*,TSC1, TSC2, VHL and XRCC2 (sequencing and deletion/duplication); EGFR, EGLN1, HOXB13, KIT, MITF, PDGFRA, POLD1 and POLE (sequencing only); EPCAM and GREM1 (deletion/duplication only).      CANCER STAGING: Cancer Staging No matching staging information was found for the patient.  INTERVAL HISTORY:  Scott [name], a 65 y.o. 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 Scott [name] was last seen on 07/21/20.   Today Scott [name] reports feeling well. Scott [name] reports fatigue, hot flashes, weight gain, and worsening vision. Scott [name] denies any new pains, fevers, infections, n/v/d, loss of appetite, or ankle swellings.   REVIEW OF SYSTEMS:  Review of Systems  Constitutional:  Positive for fatigue (35%) and unexpected weight change. Negative for appetite change and fever.  Eyes:  Positive for eye problems.  Respiratory:  Positive for shortness of breath.   Cardiovascular:  Negative for leg swelling.  Gastrointestinal:  Negative for diarrhea, nausea and vomiting.  Endocrine: Positive for hot flashes.  Genitourinary:  Positive for dysuria.   Musculoskeletal:  Positive for back pain (lower; sciatica). Negative for arthralgias and myalgias.  All other systems reviewed and are negative.  PAST MEDICAL/SURGICAL HISTORY:  Past Medical History:  Diagnosis Date   Bladder obstruction    Complication of anesthesia    Diverticulitis 2015   Dyspnea    Essential hypertension, benign 01/26/2019  at [place] office elevated, normal at home, no medications at this time   History of basal cell cancer    HLD (hyperlipidemia) 01/26/2019   Hypothyroidism    history  of, not currently taking medication   PONV (postoperative nausea and vomiting)    Prostate cancer ([place])    metastatic to bones   Vitamin D deficiency disease 01/26/2019   Past Surgical History:  Procedure Laterality Date   CYSTOSCOPY W/ URETERAL STENT PLACEMENT Left 11/10/2019   Procedure: CYSTOSCOPY;  Surgeon: Irine Seal, MD;  Location: [facility];  Service: Urology;  Laterality: Left;   ESOPHAGOGASTRODUODENOSCOPY (EGD) WITH PROPOFOL N/A 07/07/2020   Procedure: ESOPHAGOGASTRODUODENOSCOPY (EGD) WITH PROPOFOL;  Surgeon: Daneil Dolin, MD;  Location: [facility];  Service: Endoscopy;  Laterality: N/A;  pm appt   HERNIA REPAIR  [id number]   UMBILICAL    MALONEY DILATION N/A 07/07/2020   Procedure: MALONEY DILATION;  Surgeon: Daneil Dolin, MD;  Location: [facility];  Service: Endoscopy;  Laterality: N/A;   PROSTATE SURGERY     SKIN CANCER EXCISION     TONSILLECTOMY Bilateral    TOOTH EXTRACTION     front left tooth pulled but no surgery or stitches   TRANSURETHRAL RESECTION OF PROSTATE N/A 01/25/2017   Procedure: TRANSURETHRAL RESECTION OF THE PROSTATE (TURP);  Surgeon: Alexis Frock, MD;  Location: [facility];  Service: Urology;  Laterality: N/A;   TRANSURETHRAL RESECTION OF PROSTATE N/A 11/10/2019   Procedure: TRANSURETHRAL RESECTION OF THE PROSTATE (TURP);  Surgeon: Irine Seal, MD;  Location: [facility];  Service: Urology;  Laterality: N/A;   WISDOM TOOTH EXTRACTION      SOCIAL HISTORY:  Social History   Socioeconomic History   Marital status: Married    Spouse name: Not on file   Number of children: 1   Years of education: Not on file   Highest education level: Not on file  Occupational History    Comment: working full time  Tobacco Use   Smoking status: Former    Packs/day: 1.50    Years: 35.00    Pack years: 52.50    Types: Cigarettes    Quit date: 01/12/2004    Years since quitting: 16.8   Smokeless tobacco: Never   Tobacco comments:    OVER 30 YEARS HX OF SMOKING   Vaping  Use   Vaping Use: Never used  Substance and Sexual Activity   Alcohol use: Not Currently    Comment: heavy drinker until 2004   Drug use: Not Currently    Types: Marijuana   Sexual activity: Not Currently  Other Topics Concern   Not on file  Social History Narrative   Married for 19 years.Works for [employer] from home.   Social Determinants of Health   Financial Resource Strain: Low Risk    Difficulty of Paying Living Expenses: Not hard at all  Food Insecurity: No Food Insecurity   Worried About Charity fundraiser in the Last Year: Never true   [place] in the Last Year: Never true  Transportation Needs: No Transportation Needs   Lack of Transportation (Medical): No   Lack of Transportation (Non-Medical): No  Physical Activity: Inactive   Days of Exercise per Week: 0 days   Minutes of Exercise per Session: 0 min  Stress: No Stress Concern Present   Feeling of Stress : Only a little  Social Connections: Moderately Integrated   Frequency of Communication with Friends and Family: Three times a week  Frequency of Social Gatherings with Friends and Family: Never   Attends Religious Services: Never   Marine scientist or Organizations: Yes   Attends Music therapist: 1 to 4 times per year   Marital Status: Married  Human resources officer Violence: Not At Risk   Fear of Current or Ex-Partner: No   Emotionally Abused: No   Physically Abused: No   Sexually Abused: No    FAMILY HISTORY:  Family History  Problem Relation Age of Onset   Emphysema Mother    Alzheimer's disease Father    Prostate cancer Father        dx in his 79s   Prostate cancer Brother        dx in his 77s   Breast cancer Maternal Grandmother        60s   Pancreatic cancer Maternal Grandfather        11s   Dementia Paternal Grandmother    Heart attack Paternal Grandfather    Colon cancer Neg Hx     CURRENT MEDICATIONS:  Current Outpatient Medications  Medication Sig  Dispense Refill   abiraterone acetate (ZYTIGA) 250 MG tablet TAKE 4 TABLETS DAILY ON AN EMPTY STOMACH, 1 HOUR BEFORE OR 2 HOURS AFTER A MEAL. 120 tablet 1   arginine 500 MG tablet Take 500 mg by mouth 2 (two) times daily.     ASTRAGALUS PO Take 1 tablet by mouth 2 (two) times daily.     Cholecalciferol 125 MCG (5000 UT) capsule Take 5,000-10,000 Units by mouth See admin instructions. Take 10000 units in the morning and 5000 units at night     cimetidine (TAGAMET) 200 MG tablet Take 200-400 mg by mouth daily as needed (inflammation).      DANDELION ROOT PO Take 500 mg by mouth daily.     dutasteride (AVODART) 0.5 MG capsule Take 1 capsule (0.5 mg total) by mouth daily. 30 capsule 11   estradiol (ESTRACE) 2 MG tablet Take 2 tablets (4 mg total) by mouth daily. (Patient taking differently: Take 2 mg by mouth daily.) 60 tablet 3   ibuprofen (ADVIL) 200 MG tablet Take 400 mg by mouth every 6 (six) hours as needed for moderate pain.     IRON HEME POLYPEPTIDE PO Take 1 tablet by mouth every other day.     Leuprolide Acetate, 6 Month, (LUPRON DEPOT, 67-MONTH,) 45 MG injection Inject 45 mg into the muscle every 6 (six) months.     MAGNESIUM PO Take 1 tablet by mouth at bedtime.     MELATONIN PO Take 40 mg by mouth at bedtime.     MILK THISTLE PO Take 1 capsule by mouth daily.     Misc Natural Products (TURMERIC CURCUMIN) CAPS Take 1 capsule by mouth 2 (two) times daily.     NATTOKINASE PO Take 2 capsules by mouth daily.     NP THYROID 120 MG tablet Take 1 tablet (120 mg total) by mouth daily before breakfast. 30 tablet 3   OVER THE COUNTER MEDICATION Take 1 capsule by mouth 2 (two) times daily. [place] Mushroom  Anti-Cancer /Immune Booster     OVER THE COUNTER MEDICATION Take 1 capsule by mouth daily. Gamma E otc supplement     OVER THE COUNTER MEDICATION Take 1 capsule by mouth 2 (two) times daily. zyflamend otc supplement     pantoprazole (PROTONIX) 40 MG tablet Take 1 tablet by mouth daily.      predniSONE (DELTASONE) 5 MG tablet Take  1 tablet (5 mg total) by mouth daily with breakfast. 30 tablet 6   QUERCETIN PO Take 1 tablet by mouth 2 (two) times daily.     traMADol (ULTRAM) 50 MG tablet Take 1 tablet (50 mg total) by mouth every 12 (twelve) hours as needed. 30 tablet 0   VITAMIN K PO Take 1 capsule by mouth every other day.     Zinc 30 MG CAPS Take 30 mg by mouth daily.     No current facility-administered medications for this visit.   Facility-Administered Medications Ordered in Other Visits  Medication Dose Route Frequency Provider Last Rate Last Admin   heparin lock flush 100 unit/mL  500 Units Intracatheter Once PRN [name], MD       sodium chloride flush (NS) 0.9 % injection 10 mL  10 mL Intracatheter PRN [name], MD        ALLERGIES:  Allergies  Allergen Reactions   Ivp Dye [Iodinated Diagnostic Agents] Swelling    Swelling on head, took Benadryl and swelling reduced   Wellbutrin [Bupropion] Hives    PHYSICAL EXAM:  Performance status (ECOG): 0 - Asymptomatic  There were no vitals filed for this visit. Wt Readings from Last 3 Encounters:  08/01/20 177 lb 8 oz (80.5 kg)  07/21/20 179 lb 4 oz (81.3 kg)  07/07/20 174 lb 2.6 oz (79 kg)   Physical Exam Vitals reviewed.  Constitutional:      Appearance: Normal appearance.  Cardiovascular:     Rate and Rhythm: Normal rate and regular rhythm.     Pulses: Normal pulses.     Heart sounds: Normal heart sounds.  Pulmonary:     Effort: Pulmonary effort is normal.     Breath sounds: Normal breath sounds.  Musculoskeletal:     Right lower leg: No edema.     Left lower leg: No edema.  Neurological:     General: No focal deficit present.     Mental Status: Scott [name] is alert and oriented to person, place, and time.  Psychiatric:        Mood and Affect: Mood normal.        Behavior: Behavior normal.     LABORATORY DATA:  I have reviewed the labs as listed.  CBC Latest Ref Rng & Units  11/01/2020 08/23/2020 07/21/2020  WBC 4.0 - 10.5 K/uL 5.2 4.7 7.5  Hemoglobin 13.0 - 17.0 g/dL 12.8(L) 12.9(L) 13.0  Hematocrit 39.0 - 52.0 % 35.9(L) 36.1(L) 37.2(L)  Platelets 150 - 400 K/uL 206 176 261   CMP Latest Ref Rng & Units 11/01/2020 08/23/2020 07/21/2020  Glucose 70 - 99 mg/dL 103(H) 104(H) 117(H)  BUN 8 - 23 mg/dL 16 14 19   Creatinine 0.61 - 1.24 mg/dL 0.69 0.66 0.72  Sodium 135 - 145 mmol/L 133(L) 135 135  Potassium 3.5 - 5.1 mmol/L 3.6 3.9 4.0  Chloride 98 - 111 mmol/L 105 104 105  CO2 22 - 32 mmol/L 21(L) 25 24  Calcium 8.9 - 10.3 mg/dL 8.7(L) 8.7(L) 8.9  Total Protein 6.5 - 8.1 g/dL 6.2(L) 6.3(L) 6.6  Total Bilirubin 0.3 - 1.2 mg/dL 1.1 1.1 0.7  Alkaline Phos 38 - 126 U/L 66 77 140(H)  AST 15 - 41 U/L 16 15 13(L)  ALT 0 - 44 U/L 14 11 11     DIAGNOSTIC IMAGING:  I have independently reviewed the scans and discussed with the patient. No results found.   ASSESSMENT:  1.  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  Prominent left hydronephrosis and hydroureter noted.  New sclerotic lesion in the left anterior vertebral body of L1.  Mild left periaortic adenopathy increased from prior, short axis lymph node measuring 1 cm. -Last PSA increased to 15.8 on 09/28/2019. -As there is rapid progression of disease, I have recommended docetaxel chemotherapy.  Scott [name] is agreeable after long discussion. -Cystoscopy with transurethral resection of tumor in the posterior bladder neck, right and left walls of the bladder, right lobe of the prostate on 11/10/2019. -Plan was to do 6 cycles of docetaxel followed by abiraterone and prednisone. -6 cycles of docetaxel from 11/16/2019 through 02/29/2020. -CTAP on 01/06/2020 showed persistent thickening of the urinary bladder wall.  Left hydronephrosis is significantly reduced compared to prior  exam.  Left-sided retroperitoneal and left iliac lymph nodes reduced in size.  Interval increase in sclerosis of lesions of the left aspect of L2 vertebral body consistent with treatment response.  No new bone lesions. -Bone scan on 01/06/2020 shows stable L2 metastatic focus. -Bone scan scan images from 06/14/2020 which showed progressive metastatic disease, with increased size and intensity of L2 1 new focus of activity at L1. 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 - Abiraterone and prednisone started on 06/24/2020.   2.  Left hydroureteronephrosis: -CT scan showed prostate mass invading the posterior bladder and obstructing the ureter.  Creatinine increased to 1.1. -Renal ultrasound on 12/11/2019 showed interval resolution of previously seen left-sided hydronephrosis.  Previously seen soft tissue mass at the posterior bladder lumen no longer seen.   PLAN:  1.  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  Scott [name] has some weight gain. - We reviewed his labs.  PSA is down to 0.89.  LFTs are normal.  Potassium is 3.6.  Blood pressure is staying in the normal range when checked at home. - We have talked about decreasing prednisone to 2.5 mg alternating with 5 mg daily. - Scott [name] will receive Lupron injection next month. - Plan to see him back in 3 months with repeat labs.   2.  Hot flashes: - Scott [name] is taking estradiol 1 mg tablet daily which is controlling hot flashes.  Scott [name] would like to switch to patch preparation.  I have sent a prescription for estradiol 0.1 mg patch twice weekly.   3.  Peripheral neuropathy: - Left foot neuropathy is stable.   4.  Low back pain: - Scott [name] received radiation therapy. - Scott [name] does not report any pain at this time.  5.  Hypothyroidism: - Scott [name] is taking NP thyroid 120 mg daily. - She is in the process of finding a primary care doctor.  We will check his TSH and T3 levels.  We will also send refill for NP  thyroid.   Orders placed this encounter:  No orders of the defined types were placed in this encounter.    [name], MD [place] [phone]   I, Thana Ates, am acting as a scribe for Dr. [name].  I, [name] MD, have reviewed the above documentation for accuracy and completeness, and I agree with the above.

## 2020-11-08 ENCOUNTER — Inpatient Hospital Stay (HOSPITAL_BASED_OUTPATIENT_CLINIC_OR_DEPARTMENT_OTHER): Payer: BC Managed Care – PPO | Admitting: Hematology

## 2020-11-08 ENCOUNTER — Other Ambulatory Visit: Payer: Self-pay

## 2020-11-08 ENCOUNTER — Inpatient Hospital Stay (HOSPITAL_COMMUNITY): Payer: BC Managed Care – PPO

## 2020-11-08 ENCOUNTER — Other Ambulatory Visit (HOSPITAL_COMMUNITY): Payer: Self-pay | Admitting: *Deleted

## 2020-11-08 VITALS — BP 166/81 | HR 62 | Temp 96.9°F | Resp 18 | Wt 189.4 lb

## 2020-11-08 DIAGNOSIS — C61 Malignant neoplasm of prostate: Secondary | ICD-10-CM | POA: Diagnosis not present

## 2020-11-08 MED ORDER — ESTRADIOL 0.1 MG/24HR TD PTTW: 1.0000 | MEDICATED_PATCH | TRANSDERMAL | 12 refills | Status: DC

## 2020-11-08 MED ORDER — NP THYROID 120 MG PO TABS: 120.0000 mg | ORAL_TABLET | Freq: Every day | ORAL | 3 refills | Status: DC

## 2020-11-08 NOTE — Patient Instructions (Addendum)
[facility] at [facility] Discharge Instructions  You were seen today by Dr. Katragadda. He went over your recent results. Dr. Katragadda will see you back in 3 months for labs and follow up.   Thank you for choosing [facility] at [facility] to provide your oncology and hematology care.  To afford each patient quality time with our provider, please arrive at least 15 minutes before your scheduled appointment time.   If you have a lab appointment with the Cancer Center please come in thru the Main Entrance and check in at the main information desk  You need to re-schedule your appointment should you arrive 10 or more minutes late.  We strive to give you quality time with our providers, and arriving late affects you and other patients whose appointments are after yours.  Also, if you no show three or more times for appointments you may be dismissed from the clinic at the providers discretion.     Again, thank you for choosing [facility].  Our hope is that these requests will decrease the amount of time that you wait before being seen by our physicians.       _____________________________________________________________  Should you have questions after your visit to [facility], please contact our office at [phone] between the hours of 8:00 a.m. and 4:30 p.m.  Voicemails left after 4:00 p.m. will not be returned until the following business day.  For prescription refill requests, have your pharmacy contact our office and allow 72 hours.    Cancer Center Support Programs:   > Cancer Support Group  2nd Tuesday of the month 1pm-2pm, Journey Room   

## 2020-11-08 NOTE — Progress Notes (Signed)
Patient is taking zytiga as prescribed.  They have not missed any doses and reports side effects at this time -fatigue, hot flashes and joint pain.

## 2020-11-09 ENCOUNTER — Other Ambulatory Visit (HOSPITAL_COMMUNITY): Payer: Self-pay | Admitting: Hematology

## 2020-11-09 ENCOUNTER — Inpatient Hospital Stay (HOSPITAL_COMMUNITY): Payer: BC Managed Care – PPO

## 2020-11-09 DIAGNOSIS — C61 Malignant neoplasm of prostate: Secondary | ICD-10-CM | POA: Diagnosis not present

## 2020-11-09 LAB — TSH: TSH: 0.034 u[IU]/mL — ABNORMAL LOW (ref 0.350–4.500)

## 2020-11-10 LAB — T3, FREE: T3, Free: 3 pg/mL (ref 2.0–4.4)

## 2020-11-14 ENCOUNTER — Ambulatory Visit
Admission: EM | Admit: 2020-11-14 | Discharge: 2020-11-14 | Disposition: A | Discharge status: Home / Self Care | Payer: BC Managed Care – PPO | Attending: Family Medicine | Admitting: Family Medicine

## 2020-11-14 ENCOUNTER — Other Ambulatory Visit: Payer: Self-pay

## 2020-11-14 ENCOUNTER — Encounter: Payer: Self-pay | Admitting: Emergency Medicine

## 2020-11-14 ENCOUNTER — Ambulatory Visit: Payer: Self-pay

## 2020-11-14 DIAGNOSIS — B029 Zoster without complications: Secondary | ICD-10-CM | POA: Diagnosis not present

## 2020-11-14 MED ORDER — VALACYCLOVIR HCL 1 G PO TABS: 1000.0000 mg | ORAL_TABLET | Freq: Three times a day (TID) | ORAL | 0 refills | Status: AC

## 2020-11-14 MED ORDER — TRIAMCINOLONE ACETONIDE 0.1 % EX CREA: 1.0000 "application " | TOPICAL_CREAM | Freq: Three times a day (TID) | CUTANEOUS | 0 refills | Status: DC

## 2020-11-14 NOTE — ED Triage Notes (Signed)
Pain to LT lower back that shoots down leg x 1 week. Painful red Rash on LT groin that came up on Friday and rash to LT lower back that came up yesterday.

## 2020-11-21 NOTE — ED Provider Notes (Signed)
[facility]    CSN: [id number] Arrival date & time: 11/14/20  0848      History   Chief Complaint Chief Complaint  Patient presents with   Herpes Zoster    HPI Scott [name] is a 65 y.o. male.   HPI  Patient presents today for evaluation of rash that is inducing pain shooting down the left leg into the left groin area and left lower back.  Patient reports over a week of a painful erythematous vesicular rash which started at the left groin and is now expanded to the left flank.  The rash has increased in length and expanded in diameter.  He is actively taking oral chemo medication.  Denies any other known triggers for rash eruption.  Denies fever. Past Medical History:  Diagnosis Date   Bladder obstruction    Complication of anesthesia    Diverticulitis 2015   Dyspnea    Essential hypertension, benign 01/26/2019   at [place] office elevated, normal at home, no medications at this time   History of basal cell cancer    HLD (hyperlipidemia) 01/26/2019   Hypothyroidism    history of, not currently taking medication   PONV (postoperative nausea and vomiting)    Prostate cancer ([place])    metastatic to bones   Vitamin D deficiency disease 01/26/2019    Patient Active Problem List   Diagnosis Date Noted   Malignant neoplasm of prostate metastatic to bone ([place]) 07/19/2020   Dysphagia 05/30/2020   Colon cancer screening 123456   Periumbilical hernia 123456   Genetic testing 04/20/2020   Family history of prostate cancer    Family history of breast cancer    Family history of pancreatic cancer    Ureteral obstruction, left 11/11/2019   Bladder outlet obstruction 11/11/2019   Goals of care, counseling/discussion 11/04/2019   Hot flash due to medication 10/09/2019   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 10/09/2019   Essential hypertension, benign 01/26/2019   HLD (hyperlipidemia) 01/26/2019   Vitamin D deficiency disease 01/26/2019   Prostate  cancer metastatic to multiple sites ([place]) 01/25/2017    Past Surgical History:  Procedure Laterality Date   CYSTOSCOPY W/ URETERAL STENT PLACEMENT Left 11/10/2019   Procedure: CYSTOSCOPY;  Surgeon: Irine Seal, MD;  Location: [facility];  Service: Urology;  Laterality: Left;   ESOPHAGOGASTRODUODENOSCOPY (EGD) WITH PROPOFOL N/A 07/07/2020   Procedure: ESOPHAGOGASTRODUODENOSCOPY (EGD) WITH PROPOFOL;  Surgeon: Daneil Dolin, MD;  Location: [facility];  Service: Endoscopy;  Laterality: N/A;  pm appt   HERNIA REPAIR  [id number]   UMBILICAL    MALONEY DILATION N/A 07/07/2020   Procedure: MALONEY DILATION;  Surgeon: Daneil Dolin, MD;  Location: [facility];  Service: Endoscopy;  Laterality: N/A;   PROSTATE SURGERY     SKIN CANCER EXCISION     TONSILLECTOMY Bilateral    TOOTH EXTRACTION     front left tooth pulled but no surgery or stitches   TRANSURETHRAL RESECTION OF PROSTATE N/A 01/25/2017   Procedure: TRANSURETHRAL RESECTION OF THE PROSTATE (TURP);  Surgeon: Alexis Frock, MD;  Location: [facility];  Service: Urology;  Laterality: N/A;   TRANSURETHRAL RESECTION OF PROSTATE N/A 11/10/2019   Procedure: TRANSURETHRAL RESECTION OF THE PROSTATE (TURP);  Surgeon: Irine Seal, MD;  Location: [facility];  Service: Urology;  Laterality: N/A;   WISDOM TOOTH EXTRACTION         Home Medications    Prior to Admission medications   Medication Sig Start Date End  Date Taking? Authorizing Provider  triamcinolone cream (KENALOG) 0.1 % Apply 1 application topically 3 (three) times daily. 11/14/20  Yes Scot Jun, FNP  valACYclovir (VALTREX) 1000 MG tablet Take 1 tablet (1,000 mg total) by mouth 3 (three) times daily for 14 days. 11/14/20 11/28/20 Yes Scot Jun, FNP  abiraterone acetate (ZYTIGA) 250 MG tablet TAKE 4 TABLETS DAILY ON AN EMPTY STOMACH, 1 HOUR BEFORE OR 2 HOURS AFTER A MEAL. 11/09/20   [name], MD  arginine 500 MG tablet Take 500 mg by mouth 2 (two) times daily.    [provider]  ASTRAGALUS PO Take 1 tablet by mouth 2 (two) times daily.    [provider]  Cholecalciferol 125 MCG (5000 UT) capsule Take 5,000-10,000 Units by mouth See admin instructions. Take 10000 units in the morning and 5000 units at night    [provider]  cimetidine (TAGAMET) 200 MG tablet Take 200-400 mg by mouth daily as needed (inflammation).     [provider]  DANDELION ROOT PO Take 500 mg by mouth daily.    [provider]  dutasteride (AVODART) 0.5 MG capsule Take 1 capsule (0.5 mg total) by mouth daily. 04/28/20   Irine Seal, MD  estradiol (ESTRACE) 2 MG tablet Take 2 tablets (4 mg total) by mouth daily. Patient not taking: Reported on 11/08/2020 03/02/20   Doree Albee, MD  estradiol (VIVELLE-DOT) 0.1 MG/24HR patch Place 1 patch (0.1 mg total) onto the skin 2 (two) times a week. 11/10/20   [name], MD  ibuprofen (ADVIL) 200 MG tablet Take 400 mg by mouth every 6 (six) hours as needed for moderate pain.    [provider]  IRON HEME POLYPEPTIDE PO Take 1 tablet by mouth every other day.    [provider]  Leuprolide Acetate, 6 Month, (LUPRON DEPOT, 41-MONTH,) 45 MG injection Inject 45 mg into the muscle every 6 (six) months.    [provider]  MAGNESIUM PO Take 1 tablet by mouth at bedtime.    [provider]  MELATONIN PO Take 40 mg by mouth at bedtime.    [provider]  MILK THISTLE PO Take 1 capsule by mouth daily.    [provider]  Misc Natural Products (TURMERIC CURCUMIN) CAPS Take 1 capsule by mouth 2 (two) times daily.    [provider]  NATTOKINASE PO Take 2 capsules by mouth daily.    [provider]  NP THYROID 120 MG tablet Take 1 tablet (120 mg total) by mouth daily before breakfast. 11/08/20   [name], MD  OVER THE COUNTER MEDICATION Take 1 capsule by mouth 2 (two) times daily. [place] Mushroom  Anti-Cancer /Immune  Medical laboratory scientific officer, Historical, MD  OVER THE COUNTER MEDICATION Take 1 capsule by mouth daily. Gamma E otc supplement    [provider]  OVER THE COUNTER MEDICATION Take 1 capsule by mouth 2 (two) times daily. zyflamend otc supplement    [provider]  pantoprazole (PROTONIX) 40 MG tablet Take 1 tablet by mouth daily. 07/07/20   [provider]  predniSONE (DELTASONE) 5 MG tablet Take 1 tablet (5 mg total) by mouth daily with breakfast. 06/16/20   [name], MD  QUERCETIN PO Take 1 tablet by mouth 2 (two) times daily.    [provider]  traMADol (ULTRAM) 50 MG tablet Take 1 tablet (50 mg total) by mouth every 12 (twelve) hours as needed. 06/16/20  [name], MD  VITAMIN K PO Take 1 capsule by mouth every other day.    [provider]  Zinc 30 MG CAPS Take 30 mg by mouth daily.    [provider]    Family History Family History  Problem Relation Age of Onset   Emphysema Mother    Alzheimer's disease Father    Prostate cancer Father        dx in his 15s   Prostate cancer Brother        dx in his 23s   Breast cancer Maternal Grandmother        78s   Pancreatic cancer Maternal Grandfather        37s   Dementia Paternal Grandmother    Heart attack Paternal Grandfather    Colon cancer Neg Hx     Social History Social History   Tobacco Use   Smoking status: Former    Packs/day: 1.50    Years: 35.00    Pack years: 52.50    Types: Cigarettes    Quit date: 01/12/2004    Years since quitting: 16.8   Smokeless tobacco: Never   Tobacco comments:    OVER 30 YEARS HX OF SMOKING   Vaping Use   Vaping Use: Never used  Substance Use Topics   Alcohol use: Not Currently    Comment: heavy drinker until 2004   Drug use: Not Currently    Types: Marijuana     Allergies   Ivp dye [iodinated diagnostic agents] and Wellbutrin [bupropion]   Review of Systems Review of Systems Pertinent negatives listed in  HPI  Physical Exam Triage Vital Signs ED Triage Vitals [11/14/20 0921]  Enc Vitals Group     BP (!) 151/87     Pulse Rate 68     Resp 18     Temp 99 F (37.2 C)     Temp Source Oral     SpO2 98 %     Weight      Height      Head Circumference      Peak Flow      Pain Score 6     Pain Loc      Pain Edu?      Excl. in [place]?    No data found.  Updated Vital Signs BP (!) 151/87 (BP Location: Right Arm)   Pulse 68   Temp 99 F (37.2 C) (Oral)   Resp 18   SpO2 98%   Visual Acuity Right Eye Distance:   Left Eye Distance:   Bilateral Distance:    Right Eye Near:   Left Eye Near:    Bilateral Near:     Physical Exam General appearance: Alert, well developed, well nourished, cooperative  Head: Normocephalic, without obvious abnormality, atraumatic Respiratory: Respirations even and unlabored, normal respiratory rate Heart: rate and rhythm normal. No gallop or murmurs noted on exam  Abdomen: BS +, no distention, no rebound tenderness, or no mass Extremities: No gross deformities Skin: Skin color, texture, turgor normal. No rashes seen  Psych: Appropriate mood and affect. Neurologic: GCS 15, normal coordination, normal gait UC Treatments / Results  Labs (all labs ordered are listed, but only abnormal results are displayed) Labs Reviewed - No data to display  EKG   Radiology No results found.  Procedures Procedures (including critical care time)  Medications Ordered in UC Medications - No data to display  Initial Impression / Assessment and Plan / UC Course  I  have reviewed the triage vital signs and the nursing notes.  Pertinent labs & imaging results that were available during my care of the patient were reviewed by me and considered in my medical decision making (see chart for details).    Herpes Zoster without complication Treatment per discharge medication orders. Strict return precautions given if any symptoms worsen and do not readily  improve. Final Clinical Impressions(s) / UC Diagnoses   Final diagnoses:  Herpes zoster without complication   Discharge Instructions   None    ED Prescriptions     Medication Sig Dispense Auth. Provider   valACYclovir (VALTREX) 1000 MG tablet Take 1 tablet (1,000 mg total) by mouth 3 (three) times daily for 14 days. 42 tablet Scot Jun, FNP   triamcinolone cream (KENALOG) 0.1 % Apply 1 application topically 3 (three) times daily. 454 g Scot Jun, FNP      PDMP not reviewed this encounter.   Scot Jun, FNP 11/21/20 [phone]

## 2020-12-01 ENCOUNTER — Other Ambulatory Visit: Payer: Self-pay

## 2020-12-01 ENCOUNTER — Ambulatory Visit (INDEPENDENT_AMBULATORY_CARE_PROVIDER_SITE_OTHER): Payer: BC Managed Care – PPO | Admitting: Urology

## 2020-12-01 ENCOUNTER — Encounter: Payer: Self-pay | Admitting: Urology

## 2020-12-01 VITALS — BP 149/79 | HR 103

## 2020-12-01 DIAGNOSIS — R351 Nocturia: Secondary | ICD-10-CM

## 2020-12-01 DIAGNOSIS — N135 Crossing vessel and stricture of ureter without hydronephrosis: Secondary | ICD-10-CM

## 2020-12-01 DIAGNOSIS — N403 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: Secondary | ICD-10-CM

## 2020-12-01 DIAGNOSIS — C61 Malignant neoplasm of prostate: Secondary | ICD-10-CM

## 2020-12-01 MED ORDER — LEUPROLIDE ACETATE (6 MONTH) 45 MG IM KIT: 45.0000 mg | PACK | Freq: Once | INTRAMUSCULAR | Status: DC

## 2020-12-01 MED ORDER — LEUPROLIDE ACETATE (6 MONTH) 45 MG ~~LOC~~ KIT
45.0000 mg | PACK | Freq: Once | SUBCUTANEOUS | Status: AC
  Administered 2020-12-01: 45 mg via SUBCUTANEOUS

## 2020-12-01 NOTE — Progress Notes (Signed)
Subjective: 12/01/20: Scott [name] returns today in f/u for Lupron.  He has been dealing with shingles on the left flank and upper leg for the last 3-4 weeks but that is improving.  His last PSA in 8/22 was down to 0.89.  He started Zytiga in 4/22 and had lumbar SBRT in 5/22.   He has done well with that and his bone pain has improved.  He is voiding well with an IPSS of 2-3.  His Alk phos has normalized.    06/02/20: Scott [name] returns in f/u for his history of CRCP.  His PSA has been rising despite 6 rounds of chemo.   He is seeing Dr. Anastasio Champion and is on oral estradiol 77m po bid but the hot flashes have returned.   He remains on dutasteride and Lupron and his T is  <3 and PSA is up to 12.9 on 05/26/20 from 4.77 on 04/28/20.  He is voiding well and his Cr was 0.8 on 2/10.  He has back pain that improved with chemo and prednisone but it has recurred.   His bone scan on 01/14/20 demonstrated stable L2 mets.  The CT showed improved left hydro with minimal residual bilateral hydro.  He is due for Lupron.  He is voiding well.  His IPSS is 6.  He has a tooth that needs extraction.     12/04/19: Scott [name] today in f/u from a TURP on 11/10/19 with resection of both UO's for malignant obstruction.   He is voiding better but has some frequency and nocturia 2-4x.  He has some left flank pain with voiding and probably has reflux.   His IPSS is 14.   He has had some hematuria but is improving.   He has some bone pain with the fulphila given for bone marrow support.   He is otherwise tolerating chemo after one dose.  His Cr is down to 0.9 from 1.1 prior to the resection.   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 CT with bilateral non-bulky iliac adenopathy and tiny uptake Lt orbit (no spine mets) by bone scan.  His PSA is up to 15.5 from  9.47 at last check and a doubling over  the last year. The testosterone remains castrate at <3 on Lupron and dutasteride. He remains on estradiol 0.174mpatches for the hot flashes.   He had a telehealth visit with Dr. MaTammi Klippelor consideration of radiation therapy to the primary and pelvic nodes [place] [facility] [place] [place] but decided against that.  He is seeing Dr. KaDelton Coombess well but is  reluctant to consider Zytiga because of the prednisone.   He has had no recent hematuria.  He has occasional frequency with urgency and UUI.  He wears a pad. He has a reduced stream.  He has nocturia 3-4x but he is drinking a lot of fluids.   His IPSS is 29-30.   Present Management:  01/2017 begin Lupron androgen deprivation Q6m11moeclined reccomended orchiectomy)  04/2017 - PSA 2.86;  07/2017 PSA/T 3.44 / T 3 ==> Lupron 73m98m7/23/19 PSA 2.1.  02/07/18 PSA 2.19/T 4. added Estradiol 0.1mg 19mches twice weekly for the hot flashes.  03/10/18 PSA 1.87/T<3.  05/09/18 PSA 2.09/T10/ Est 32.  07/24/18 PSA 6/T 54  08/08/18 Lupron 73mg.32m22/20 PSA 4.37  10/22/18 PSA  4.64/T<3.  11/26/18 Bone scan negative/ CT C/A/P negative. 12/11/18 [facility] PET small equivocal periaortic and left ext iliac nodes and right prostate activity.  I have discussed that with him.  11/28/18 PSA 6.46. 02/10/19 PSA 7.31/T <3. 03/19/19 PSA 8.20 04/11/19  Lupron 42m given.  04/23/19 PSA 9.96. 06/23/19 PSA 9.47. 09/28/19 PSA 15.56, T <3.  10/09/19 Lupron 426mgiven 11/10/19  Operative cysto / TURP 2018 with inviltrative bladder neck / prostate mass ( left UO purposefully resected).  11/16/19 PSA 20.69.   11/06/19 Scott [name] a mass at the base of the bladder consistent with local invasion from his CRCP and he has left ureteral obstruction with a minor increase in his Cr.  He has seen Dr. KaDelton Coombesnd is scheduled to begin chemotherapy but it was felt that decompression of the left kidney was indicated.   He is have increased voiding difficulty with passage of clots as well.     Results for Scott [name], Scott [name] [id number] of 07/10/2019 08:56  Ref. Range 11/28/2018 10:23 02/10/2019 13:25 03/19/2019 13:49 04/23/2019 13:58 06/23/2019 13:41  Prostatic Specific Antigen Latest Ref Range: 0.00 - 4.00 ng/mL 6.46 (H) 7.31 (H) 8.20 (H) 9.96 (H) 9.47 (H)       ROS:  ROS:  A complete review of systems was performed.  All systems are negative except for pertinent findings as noted.   ROS  Allergies  Allergen Reactions   Ivp Dye [Iodinated Diagnostic Agents] Swelling    Swelling on head, took Benadryl and swelling reduced   Wellbutrin [Bupropion] Hives    Outpatient Encounter Medications as of 12/01/2020  Medication Sig   abiraterone acetate (ZYTIGA) 250 MG tablet TAKE 4 TABLETS DAILY ON AN EMPTY STOMACH, 1 HOUR BEFORE OR 2 HOURS AFTER A MEAL.   arginine 500 MG tablet Take 500 mg by mouth 2 (two) times daily.   ASTRAGALUS PO Take 1 tablet by mouth 2 (two) times daily.   Cholecalciferol 125 MCG (5000 UT) capsule Take 5,000-10,000 Units by mouth See admin instructions. Take 10000 units in the morning and 5000 units at night   cimetidine (TAGAMET) 200 MG tablet Take 200-400 mg by mouth daily as needed (inflammation).    DANDELION ROOT PO Take 500 mg by mouth daily.   dutasteride (AVODART) 0.5 MG capsule Take 1 capsule (0.5 mg total) by mouth daily.   estradiol (VIVELLE-DOT) 0.1 MG/24HR patch Place 1 patch (0.1 mg total) onto the skin 2 (two) times a week.   ibuprofen (ADVIL) 200 MG tablet Take 400 mg by mouth every 6 (six) hours as needed for moderate pain.   IRON HEME POLYPEPTIDE PO Take 1 tablet by mouth every other day.   Leuprolide Acetate, 6 Month, (LUPRON DEPOT, 50-MONTH,) 45 MG injection Inject 45 mg into the muscle every 6 (six) months.   MAGNESIUM PO Take 1 tablet by mouth at bedtime.   MELATONIN PO Take 40 mg by mouth at bedtime.   MILK THISTLE PO Take 1 capsule by mouth daily.   Misc Natural Products (TURMERIC CURCUMIN) CAPS Take 1 capsule by mouth 2 (two) times  daily.   NATTOKINASE PO Take 2 capsules by mouth daily.   NP THYROID 120 MG tablet Take 1 tablet (120 mg total) by mouth daily before breakfast. (Patient taking differently: Take 60 mg by mouth daily before breakfast.)   OVER THE COUNTER MEDICATION Take 1 capsule by mouth 2 (two) times daily. Tu[place]ail Mushroom  Anti-Cancer /Immune Booster   OVER THE COUNTER MEDICATION Take 1  capsule by mouth daily. Gamma E otc supplement   OVER THE COUNTER MEDICATION Take 1 capsule by mouth 2 (two) times daily. zyflamend otc supplement   pantoprazole (PROTONIX) 40 MG tablet Take 1 tablet by mouth daily.   predniSONE (DELTASONE) 5 MG tablet Take 1 tablet (5 mg total) by mouth daily with breakfast.   QUERCETIN PO Take 1 tablet by mouth 2 (two) times daily.   traMADol (ULTRAM) 50 MG tablet Take 1 tablet (50 mg total) by mouth every 12 (twelve) hours as needed.   triamcinolone cream (KENALOG) 0.1 % Apply 1 application topically 3 (three) times daily.   VITAMIN K PO Take 1 capsule by mouth every other day.   Zinc 30 MG CAPS Take 30 mg by mouth daily.   [DISCONTINUED] estradiol (ESTRACE) 2 MG tablet Take 2 tablets (4 mg total) by mouth daily.   Facility-Administered Encounter Medications as of 12/01/2020  Medication   heparin lock flush 100 unit/mL   [COMPLETED] leuprolide (6 Month) (ELIGARD) injection 45 mg   sodium chloride flush (NS) 0.9 % injection 10 mL   [DISCONTINUED] Leuprolide Acetate (6 Month) (LUPRON) injection 45 mg    Past Medical History:  Diagnosis Date   Bladder obstruction    Complication of anesthesia    Diverticulitis 2015   Dyspnea    Essential hypertension, benign 01/26/2019   at [place] office elevated, normal at home, no medications at this time   History of basal cell cancer    HLD (hyperlipidemia) 01/26/2019   Hypothyroidism    history of, not currently taking medication   PONV (postoperative nausea and vomiting)    Prostate cancer ([place])    metastatic to bones   Vitamin D  deficiency disease 01/26/2019    Past Surgical History:  Procedure Laterality Date   CYSTOSCOPY W/ URETERAL STENT PLACEMENT Left 11/10/2019   Procedure: CYSTOSCOPY;  Surgeon: Irine Seal, MD;  Location: [facility];  Service: Urology;  Laterality: Left;   ESOPHAGOGASTRODUODENOSCOPY (EGD) WITH PROPOFOL N/A 07/07/2020   Procedure: ESOPHAGOGASTRODUODENOSCOPY (EGD) WITH PROPOFOL;  Surgeon: Daneil Dolin, MD;  Location: [facility];  Service: Endoscopy;  Laterality: N/A;  pm appt   HERNIA REPAIR  [id number]   UMBILICAL    MALONEY DILATION N/A 07/07/2020   Procedure: MALONEY DILATION;  Surgeon: Daneil Dolin, MD;  Location: [facility];  Service: Endoscopy;  Laterality: N/A;   PROSTATE SURGERY     SKIN CANCER EXCISION     TONSILLECTOMY Bilateral    TOOTH EXTRACTION     front left tooth pulled but no surgery or stitches   TRANSURETHRAL RESECTION OF PROSTATE N/A 01/25/2017   Procedure: TRANSURETHRAL RESECTION OF THE PROSTATE (TURP);  Surgeon: Alexis Frock, MD;  Location: [facility];  Service: Urology;  Laterality: N/A;   TRANSURETHRAL RESECTION OF PROSTATE N/A 11/10/2019   Procedure: TRANSURETHRAL RESECTION OF THE PROSTATE (TURP);  Surgeon: Irine Seal, MD;  Location: [facility];  Service: Urology;  Laterality: N/A;   WISDOM TOOTH EXTRACTION      Social History   Socioeconomic History   Marital status: Married    Spouse name: Not on file   Number of children: 1   Years of education: Not on file   Highest education level: Not on file  Occupational History    Comment: working full time  Tobacco Use   Smoking status: Former    Packs/day: 1.50    Years: 35.00    Pack years: 52.50    Types: Cigarettes  Quit date: 01/12/2004    Years since quitting: 16.8   Smokeless tobacco: Never   Tobacco comments:    OVER 30 YEARS HX OF SMOKING   Vaping Use   Vaping Use: Never used  Substance and Sexual Activity   Alcohol use: Not Currently    Comment: heavy drinker until 2004   Drug use: Not Currently     Types: Marijuana   Sexual activity: Not Currently  Other Topics Concern   Not on file  Social History Narrative   Married for 19 years.Works for [employer] from home.   Social Determinants of Health   Financial Resource Strain: Low Risk    Difficulty of Paying Living Expenses: Not hard at all  Food Insecurity: No Food Insecurity   Worried About Charity fundraiser in the Last Year: Never true   [place] in the Last Year: Never true  Transportation Needs: No Transportation Needs   Lack of Transportation (Medical): No   Lack of Transportation (Non-Medical): No  Physical Activity: Inactive   Days of Exercise per Week: 0 days   Minutes of Exercise per Session: 0 min  Stress: No Stress Concern Present   Feeling of Stress : Only a little  Social Connections: Moderately Integrated   Frequency of Communication with Friends and Family: Three times a week   Frequency of Social Gatherings with Friends and Family: Never   Attends Religious Services: Never   Marine scientist or Organizations: Yes   Attends Music therapist: 1 to 4 times per year   Marital Status: Married  Human resources officer Violence: Not At Risk   Fear of Current or Ex-Partner: No   Emotionally Abused: No   Physically Abused: No   Sexually Abused: No    Family History  Problem Relation Age of Onset   Emphysema Mother    Alzheimer's disease Father    Prostate cancer Father        dx in his 32s   Prostate cancer Brother        dx in his 22s   Breast cancer Maternal Grandmother        60s   Pancreatic cancer Maternal Grandfather        83s   Dementia Paternal Grandmother    Heart attack Paternal Grandfather    Colon cancer Neg Hx        Objective: Vitals:   12/01/20 1318  BP: (!) 149/79  Pulse: (!) 103     Physical ExamI  Recent Results (from the past 2160 hour(s))  Comprehensive metabolic panel     Status: Abnormal   Collection Time: 11/01/20  2:25 PM  Result  Value Ref Range   Sodium 133 (L) 135 - 145 mmol/L   Potassium 3.6 3.5 - 5.1 mmol/L   Chloride 105 98 - 111 mmol/L   CO2 21 (L) 22 - 32 mmol/L   Glucose, Bld 103 (H) 70 - 99 mg/dL    Comment: Glucose reference range applies only to samples taken after fasting for at least 8 hours.   BUN 16 8 - 23 mg/dL   Creatinine, Ser 0.69 0.61 - 1.24 mg/dL   Calcium 8.7 (L) 8.9 - 10.3 mg/dL   Total Protein 6.2 (L) 6.5 - 8.1 g/dL   Albumin 3.7 3.5 - 5.0 g/dL   AST 16 15 - 41 U/L   ALT 14 0 - 44 U/L   Alkaline Phosphatase 66 38 - 126 U/L   Total  Bilirubin 1.1 0.3 - 1.2 mg/dL   GFR, Estimated >60 >60 mL/min    Comment: (NOTE) Calculated using the CKD-EPI Creatinine Equation (2021)    Anion gap 7 5 - 15    Comment: Performed at [facility], [address] [place], [place] [address]  CBC with Differential     Status: Abnormal   Collection Time: 11/01/20  2:25 PM  Result Value Ref Range   WBC 5.2 4.0 - 10.5 K/uL   RBC 3.98 (L) 4.22 - 5.81 MIL/uL   Hemoglobin 12.8 (L) 13.0 - 17.0 g/dL   HCT 35.9 (L) 39.0 - 52.0 %   MCV 90.2 80.0 - 100.0 fL   MCH 32.2 26.0 - 34.0 pg   MCHC 35.7 30.0 - 36.0 g/dL   RDW 11.7 11.5 - 15.5 %   Platelets 206 150 - 400 K/uL   nRBC 0.0 0.0 - 0.2 %   Neutrophils Relative % 61 %   Neutro Abs 3.1 1.7 - 7.7 K/uL   Lymphocytes Relative 25 %   Lymphs Abs 1.3 0.7 - 4.0 K/uL   Monocytes Relative 11 %   Monocytes Absolute 0.6 0.1 - 1.0 K/uL   Eosinophils Relative 2 %   Eosinophils Absolute 0.1 0.0 - 0.5 K/uL   Basophils Relative 1 %   Basophils Absolute 0.1 0.0 - 0.1 K/uL   Immature Granulocytes 0 %   Abs Immature Granulocytes 0.02 0.00 - 0.07 K/uL    Comment: Performed at [facility], [address] [place], [place] [address]  PSA     Status: None   Collection Time: 11/01/20  2:25 PM  Result Value Ref Range   Prostatic Specific Antigen 0.89 0.00 - 4.00 ng/mL    Comment: (NOTE) While PSA levels of <=4.0 ng/ml are reported as reference range, some men with levels below  4.0 ng/ml can have prostate cancer and many men with PSA above 4.0 ng/ml do not have prostate cancer.  Other tests such as free PSA, age specific reference ranges, PSA velocity and PSA doubling time may be helpful especially in men less than 60 years old. Performed at [facility], [place] [address], [place], [place] [address]   TSH     Status: Abnormal   Collection Time: 11/09/20  8:25 AM  Result Value Ref Range   TSH 0.034 (L) 0.350 - 4.500 uIU/mL    Comment: Performed by a 3rd Generation assay with a functional sensitivity of <=0.01 uIU/mL. Performed at [facility], [address], [place], [place] [address]   T3, free     Status: None   Collection Time: 11/09/20  8:25 AM  Result Value Ref Range   T3, Free 3.0 2.0 - 4.4 pg/mL    Comment: (NOTE) Performed At: [facility] [address] [place], [place] [id number] Rush Farmer MD HC:[id number]    I reviewed the records from his visits with Dr. Delton Coombes and his CT report.   Assessment & Plan: He has CRCP with locally advanced disease with a history of  left ureteral obstruction with AKI and he also has bone and lymph node metastases.   He is doing well with the addition of zytiga and SBRT with a decline in the PSA to 0.89.  He was given leuprolide today.  He inquired about high dose estradiol but I am reluctant to prescribe that at this time.  He is on the estradiol patch which is helping his ADT side effects.   Nodular prostate with obstruction.  He  continues to do well following the TURP.     Left ureteral obstruction.   He has no left flank pain.   Continue Luprolide 49m q676mond dutasteride.   The estradiol is managed by Dr. KaDelton Coombes  Meds ordered this encounter  Medications   leuprolide (6 Month) (ELIGARD) injection 45 mg   DISCONTD: Leuprolide Acetate (6 Month) (LUPRON) injection 45 mg     Orders Placed This Encounter  Procedures   Urinalysis, Routine w reflex microscopic      Return in  about 6 months (around 05/31/2021) for luprolide on return. .   CC: Pcp, No, Dr. SrDerek Jack    JoIrine Seal/15/2022 Patient ID: Scott [name]   DOB: 04/1955/04/236330.o.   MRN: [id number]

## 2020-12-01 NOTE — Progress Notes (Signed)
Urological Symptom Review  Patient is experiencing the following symptoms: Get up at night to urinate Erection problems (male only)   Review of Systems  Gastrointestinal (upper)  : Negative for upper GI symptoms  Gastrointestinal (lower) : Negative for lower GI symptoms  Constitutional : Fatigue  Skin: Negative for skin symptoms  Eyes: Negative for eye symptoms  Ear/Nose/Throat : Negative for Ear/Nose/Throat symptoms  Hematologic/Lymphatic: Negative for Hematologic/Lymphatic symptoms  Cardiovascular : Negative for cardiovascular symptoms  Respiratory : Shortness of breath  Endocrine: Negative for endocrine symptoms  Musculoskeletal: Back pain  Neurological: Negative for neurological symptoms  Psychologic: Negative for psychiatric symptoms   Eligard SubQ Injection   Due to Prostate Cancer patient is present today for a Eligard Injection.  Medication: Eligard 6 month Dose: 45 mg  Location: right  Lot: [id number] Exp: 05/2022  Patient tolerated well, no complications were noted  Performed by: Ashleyann Shoun LPN

## 2020-12-09 ENCOUNTER — Ambulatory Visit (HOSPITAL_COMMUNITY): Payer: BC Managed Care – PPO

## 2021-01-04 ENCOUNTER — Other Ambulatory Visit: Payer: Self-pay | Admitting: Internal Medicine

## 2021-01-05 LAB — COLOGUARD: Cologuard: NEGATIVE

## 2021-01-09 ENCOUNTER — Encounter: Payer: Self-pay | Admitting: Internal Medicine

## 2021-01-09 ENCOUNTER — Other Ambulatory Visit: Payer: Self-pay

## 2021-01-09 ENCOUNTER — Ambulatory Visit (INDEPENDENT_AMBULATORY_CARE_PROVIDER_SITE_OTHER): Payer: BC Managed Care – PPO | Admitting: Internal Medicine

## 2021-01-09 VITALS — BP 180/90 | HR 79 | Temp 98.2°F | Resp 16 | Ht 66.0 in | Wt 189.0 lb

## 2021-01-09 DIAGNOSIS — R0609 Other forms of dyspnea: Secondary | ICD-10-CM | POA: Diagnosis not present

## 2021-01-09 DIAGNOSIS — D539 Nutritional anemia, unspecified: Secondary | ICD-10-CM | POA: Diagnosis not present

## 2021-01-09 DIAGNOSIS — E519 Thiamine deficiency, unspecified: Secondary | ICD-10-CM

## 2021-01-09 DIAGNOSIS — E039 Hypothyroidism, unspecified: Secondary | ICD-10-CM | POA: Diagnosis not present

## 2021-01-09 DIAGNOSIS — I10 Essential (primary) hypertension: Secondary | ICD-10-CM | POA: Insufficient documentation

## 2021-01-09 DIAGNOSIS — R7989 Other specified abnormal findings of blood chemistry: Secondary | ICD-10-CM | POA: Insufficient documentation

## 2021-01-09 DIAGNOSIS — Z1211 Encounter for screening for malignant neoplasm of colon: Secondary | ICD-10-CM | POA: Insufficient documentation

## 2021-01-09 DIAGNOSIS — D538 Other specified nutritional anemias: Secondary | ICD-10-CM

## 2021-01-09 DIAGNOSIS — Z23 Encounter for immunization: Secondary | ICD-10-CM | POA: Diagnosis not present

## 2021-01-09 DIAGNOSIS — Z0001 Encounter for general adult medical examination with abnormal findings: Secondary | ICD-10-CM | POA: Insufficient documentation

## 2021-01-09 DIAGNOSIS — E785 Hyperlipidemia, unspecified: Secondary | ICD-10-CM

## 2021-01-09 LAB — CBC WITH DIFFERENTIAL/PLATELET
Basophils Absolute: 0.1 10*3/uL (ref 0.0–0.1)
Basophils Relative: 1.3 % (ref 0.0–3.0)
Eosinophils Absolute: 0.1 10*3/uL (ref 0.0–0.7)
Eosinophils Relative: 1.8 % (ref 0.0–5.0)
HCT: 38.4 % — ABNORMAL LOW (ref 39.0–52.0)
Hemoglobin: 13.5 g/dL (ref 13.0–17.0)
Lymphocytes Relative: 25.1 % (ref 12.0–46.0)
Lymphs Abs: 1.4 10*3/uL (ref 0.7–4.0)
MCHC: 35.1 g/dL (ref 30.0–36.0)
MCV: 91.6 fl (ref 78.0–100.0)
Monocytes Absolute: 0.6 10*3/uL (ref 0.1–1.0)
Monocytes Relative: 10 % (ref 3.0–12.0)
Neutro Abs: 3.4 10*3/uL (ref 1.4–7.7)
Neutrophils Relative %: 61.8 % (ref 43.0–77.0)
Platelets: 229 10*3/uL (ref 150.0–400.0)
RBC: 4.19 Mil/uL — ABNORMAL LOW (ref 4.22–5.81)
RDW: 14.1 % (ref 11.5–15.5)
WBC: 5.5 10*3/uL (ref 4.0–10.5)

## 2021-01-09 LAB — URINALYSIS, ROUTINE W REFLEX MICROSCOPIC
Bilirubin Urine: NEGATIVE
Ketones, ur: NEGATIVE
Leukocytes,Ua: NEGATIVE
Nitrite: NEGATIVE
Specific Gravity, Urine: 1.025 (ref 1.000–1.030)
Total Protein, Urine: NEGATIVE
Urine Glucose: NEGATIVE
Urobilinogen, UA: 0.2 (ref 0.0–1.0)
pH: 5.5 (ref 5.0–8.0)

## 2021-01-09 LAB — BASIC METABOLIC PANEL
BUN: 15 mg/dL (ref 6–23)
CO2: 24 mEq/L (ref 19–32)
Calcium: 9.3 mg/dL (ref 8.4–10.5)
Chloride: 103 mEq/L (ref 96–112)
Creatinine, Ser: 0.82 mg/dL (ref 0.40–1.50)
GFR: 93.35 mL/min (ref >60.00)
Glucose, Bld: 104 mg/dL — ABNORMAL HIGH (ref 70–99)
Potassium: 3.9 mEq/L (ref 3.5–5.1)
Sodium: 138 mEq/L (ref 135–145)

## 2021-01-09 LAB — IBC + FERRITIN
Ferritin: 69.8 ng/mL (ref 22.0–322.0)
Iron: 152 ug/dL (ref 42–165)
Saturation Ratios: 45.2 % (ref 20.0–50.0)
TIBC: 336 ug/dL (ref 250.0–450.0)
Transferrin: 240 mg/dL (ref 212.0–360.0)

## 2021-01-09 LAB — FOLATE: Folate: 10.8 ng/mL (ref >5.9)

## 2021-01-09 LAB — VITAMIN B12: Vitamin B-12: 452 pg/mL (ref 211–911)

## 2021-01-09 LAB — BRAIN NATRIURETIC PEPTIDE: Pro B Natriuretic peptide (BNP): 98 pg/mL (ref 0.0–100.0)

## 2021-01-09 LAB — TROPONIN I (HIGH SENSITIVITY): High Sens Troponin I: 6 ng/L (ref 2–17)

## 2021-01-09 MED ORDER — OLMESARTAN MEDOXOMIL 20 MG PO TABS: 20.0000 mg | ORAL_TABLET | Freq: Every day | ORAL | 0 refills | Status: DC

## 2021-01-09 MED ORDER — ROSUVASTATIN CALCIUM 10 MG PO TABS: 10.0000 mg | ORAL_TABLET | Freq: Every day | ORAL | 1 refills | Status: DC

## 2021-01-09 NOTE — Patient Instructions (Signed)
Health Maintenance, Male Adopting a healthy lifestyle and getting preventive care are important in promoting health and wellness. Ask your health care provider about: The right schedule for you to have regular tests and exams. Things you can do on your own to prevent diseases and keep yourself healthy. What should I know about diet, weight, and exercise? Eat a healthy diet  Eat a diet that includes plenty of vegetables, fruits, low-fat dairy products, and lean protein. Do not eat a lot of foods that are high in solid fats, added sugars, or sodium. Maintain a healthy weight Body mass index (BMI) is a measurement that can be used to identify possible weight problems. It estimates body fat based on height and weight. Your health care provider can help determine your BMI and help you achieve or maintain a healthy weight. Get regular exercise Get regular exercise. This is one of the most important things you can do for your health. Most adults should: Exercise for at least 150 minutes each week. The exercise should increase your heart rate and make you sweat (moderate-intensity exercise). Do strengthening exercises at least twice a week. This is in addition to the moderate-intensity exercise. Spend less time sitting. Even light physical activity can be beneficial. Watch cholesterol and blood lipids Have your blood tested for lipids and cholesterol at 63 years of age, then have this test every 5 years. You may need to have your cholesterol levels checked more often if: Your lipid or cholesterol levels are high. You are older than 63 years of age. You are at high risk for heart disease. What should I know about cancer screening? Many types of cancers can be detected early and may often be prevented. Depending on your health history and family history, you may need to have cancer screening at various ages. This may include screening for: Colorectal cancer. Prostate cancer. Skin cancer. Lung  cancer. What should I know about heart disease, diabetes, and high blood pressure? Blood pressure and heart disease High blood pressure causes heart disease and increases the risk of stroke. This is more likely to develop in people who have high blood pressure readings, are of African descent, or are overweight. Talk with your health care provider about your target blood pressure readings. Have your blood pressure checked: Every 3-5 years if you are 18-39 years of age. Every year if you are 40 years old or older. If you are between the ages of 65 and 75 and are a current or former smoker, ask your health care provider if you should have a one-time screening for abdominal aortic aneurysm (AAA). Diabetes Have regular diabetes screenings. This checks your fasting blood sugar level. Have the screening done: Once every three years after age 45 if you are at a normal weight and have a low risk for diabetes. More often and at a younger age if you are overweight or have a high risk for diabetes. What should I know about preventing infection? Hepatitis B If you have a higher risk for hepatitis B, you should be screened for this virus. Talk with your health care provider to find out if you are at risk for hepatitis B infection. Hepatitis C Blood testing is recommended for: Everyone born from 1945 through 1965. Anyone with known risk factors for hepatitis C. Sexually transmitted infections (STIs) You should be screened each year for STIs, including gonorrhea and chlamydia, if: You are sexually active and are younger than 63 years of age. You are older than 63 years   of age and your health care provider tells you that you are at risk for this type of infection. Your sexual activity has changed since you were last screened, and you are at increased risk for chlamydia or gonorrhea. Ask your health care provider if you are at risk. Ask your health care provider about whether you are at high risk for HIV.  Your health care provider may recommend a prescription medicine to help prevent HIV infection. If you choose to take medicine to prevent HIV, you should first get tested for HIV. You should then be tested every 3 months for as long as you are taking the medicine. Follow these instructions at home: Lifestyle Do not use any products that contain nicotine or tobacco, such as cigarettes, e-cigarettes, and chewing tobacco. If you need help quitting, ask your health care provider. Do not use street drugs. Do not share needles. Ask your health care provider for help if you need support or information about quitting drugs. Alcohol use Do not drink alcohol if your health care provider tells you not to drink. If you drink alcohol: Limit how much you have to 0-2 drinks a day. Be aware of how much alcohol is in your drink. In the [place] one drink equals one 12 oz bottle of beer (355 mL), one 5 oz glass of wine (148 mL), or one 1 oz glass of hard liquor (44 mL). General instructions Schedule regular health, dental, and eye exams. Stay current with your vaccines. Tell your health care provider if: You often feel depressed. You have ever been abused or do not feel safe at home. Summary Adopting a healthy lifestyle and getting preventive care are important in promoting health and wellness. Follow your health care provider's instructions about healthy diet, exercising, and getting tested or screened for diseases. Follow your health care provider's instructions on monitoring your cholesterol and blood pressure. This information is not intended to replace advice given to you by your health care provider. Make sure you discuss any questions you have with your health care provider. Document Revised: 05/13/2020 Document Reviewed: 02/26/2018 Elsevier Patient Education  2022 [employer]  

## 2021-01-09 NOTE — Progress Notes (Signed)
Subjective:  Patient ID: Scott [name], male    DOB: Nov 13, 1955  Age: 65 y.o. MRN: [id number]  CC: Annual Exam, Anemia, and Hypertension  This visit occurred during the SARS-CoV-2 public health emergency.  Safety protocols were in place, including screening questions prior to the visit, additional usage of staff PPE, and extensive cleaning of exam room while observing appropriate contact time as indicated for disinfecting solutions.    HPI Scott [name] presents for a CPX, f/up, and to establish.  He complains of a several year hx of DOE and white coat HTN.  He denies chest pain, diaphoresis, dizziness, lightheadedness, palpitations, or near syncope.  He quit smoking cigarettes 15 years ago.  History Scott [name] has a past medical history of Bladder obstruction, Complication of anesthesia, Diverticulitis (2015), Dyspnea, Essential hypertension, benign (01/26/2019), History of basal cell cancer, HLD (hyperlipidemia) (01/26/2019), Hypothyroidism, PONV (postoperative nausea and vomiting), Prostate cancer ([place]), and Vitamin D deficiency disease (01/26/2019).   He has a past surgical history that includes Wisdom tooth extraction; Hernia repair (1995); Transurethral resection of prostate (N/A, 01/25/2017); Prostate surgery; Tonsillectomy (Bilateral); Skin cancer excision; Cystoscopy w/ ureteral stent placement (Left, 11/10/2019); Transurethral resection of prostate (N/A, 11/10/2019); Tooth Extraction; Esophagogastroduodenoscopy (egd) with propofol (N/A, 07/07/2020); and maloney dilation (N/A, 07/07/2020).   His family history includes Alzheimer's disease in his father; Breast cancer in his maternal grandmother; Dementia in his paternal grandmother; Emphysema in his mother; Heart attack in his paternal grandfather; Pancreatic cancer in his maternal grandfather; Prostate cancer in his brother and father.He reports that he quit smoking about 17 years ago. His smoking use included cigarettes. He has a 52.50 pack-year  smoking history. He has never used smokeless tobacco. He reports that he does not currently use alcohol. He reports that he does not currently use drugs after having used the following drugs: Marijuana.  Outpatient Medications Prior to Visit  Medication Sig Dispense Refill   abiraterone acetate (ZYTIGA) 250 MG tablet TAKE 4 TABLETS DAILY ON AN EMPTY STOMACH, 1 HOUR BEFORE OR 2 HOURS AFTER A MEAL. 120 tablet 1   arginine 500 MG tablet Take 500 mg by mouth 2 (two) times daily.     Cholecalciferol 125 MCG (5000 UT) capsule Take 5,000-10,000 Units by mouth See admin instructions. Take 10000 units in the morning and 5000 units at night     DANDELION ROOT PO Take 500 mg by mouth daily.     dutasteride (AVODART) 0.5 MG capsule Take 1 capsule (0.5 mg total) by mouth daily. 30 capsule 11   estradiol (VIVELLE-DOT) 0.1 MG/24HR patch Place 1 patch (0.1 mg total) onto the skin 2 (two) times a week. 8 patch 12   ibuprofen (ADVIL) 200 MG tablet Take 400 mg by mouth every 6 (six) hours as needed for moderate pain.     IRON HEME POLYPEPTIDE PO Take 1 tablet by mouth every other day.     Leuprolide Acetate, 6 Month, (LUPRON DEPOT, 77-MONTH,) 45 MG injection Inject 45 mg into the muscle every 6 (six) months.     MAGNESIUM PO Take 1 tablet by mouth at bedtime.     MELATONIN PO Take 40 mg by mouth at bedtime.     MILK THISTLE PO Take 1 capsule by mouth daily.     NATTOKINASE PO Take 2 capsules by mouth daily.     pantoprazole (PROTONIX) 40 MG tablet TAKE 1 TABLET BY MOUTH DAILY 30 tablet 5   predniSONE (DELTASONE) 5 MG tablet Take 1 tablet (5 mg total) by  mouth daily with breakfast. 30 tablet 6   QUERCETIN PO Take 1 tablet by mouth 2 (two) times daily.     VITAMIN K PO Take 1 capsule by mouth every other day.     Zinc 30 MG CAPS Take 30 mg by mouth daily.     ASTRAGALUS PO Take 1 tablet by mouth 2 (two) times daily.     cimetidine (TAGAMET) 200 MG tablet Take 200-400 mg by mouth daily as needed (inflammation).       Misc Natural Products (TURMERIC CURCUMIN) CAPS Take 1 capsule by mouth 2 (two) times daily.     NP THYROID 120 MG tablet Take 1 tablet (120 mg total) by mouth daily before breakfast. (Patient taking differently: Take 60 mg by mouth daily before breakfast.) 30 tablet 3   OVER THE COUNTER MEDICATION Take 1 capsule by mouth 2 (two) times daily. [place] Mushroom  Anti-Cancer /Immune Booster     OVER THE COUNTER MEDICATION Take 1 capsule by mouth daily. Gamma E otc supplement     OVER THE COUNTER MEDICATION Take 1 capsule by mouth 2 (two) times daily. zyflamend otc supplement     traMADol (ULTRAM) 50 MG tablet Take 1 tablet (50 mg total) by mouth every 12 (twelve) hours as needed. 30 tablet 0   triamcinolone cream (KENALOG) 0.1 % Apply 1 application topically 3 (three) times daily. 454 g 0   Facility-Administered Medications Prior to Visit  Medication Dose Route Frequency Provider Last Rate Last Admin   heparin lock flush 100 unit/mL  500 Units Intracatheter Once PRN [name], MD       sodium chloride flush (NS) 0.9 % injection 10 mL  10 mL Intracatheter PRN [name], MD        ROS Review of Systems  Constitutional:  Positive for unexpected weight change (wt gain). Negative for appetite change, chills, diaphoresis and fatigue.  HENT: Negative.    Eyes: Negative.   Respiratory:  Positive for shortness of breath. Negative for cough, chest tightness and wheezing.   Cardiovascular:  Negative for chest pain, palpitations and leg swelling.  Gastrointestinal:  Negative for abdominal pain, constipation, diarrhea and vomiting.  Genitourinary: Negative.  Negative for difficulty urinating, dysuria, hematuria and urgency.  Musculoskeletal: Negative.  Negative for arthralgias, back pain, myalgias and neck pain.  Skin: Negative.  Negative for color change, pallor and rash.  Neurological: Negative.  Negative for dizziness.  Hematological:  Negative for adenopathy. Does not  bruise/bleed easily.  Psychiatric/Behavioral: Negative.     Objective:  BP (!) 180/90 (BP Location: Left Arm, Patient Position: Sitting, Cuff Size: Large) Comment: BP (L) 180/90 (R) 182/96  Pulse 79   Temp 98.2 F (36.8 C) (Oral)   Resp 16   Ht 5\' 6"  (1.676 m)   Wt 189 lb (85.7 kg)   SpO2 98%   BMI 30.51 kg/m   Physical Exam Vitals reviewed.  Constitutional:      General: He is not in acute distress.    Appearance: He is obese. He is not ill-appearing, toxic-appearing or diaphoretic.  HENT:     Nose: Nose normal.     Mouth/Throat:     Mouth: Mucous membranes are moist.  Eyes:     General: No scleral icterus.    Conjunctiva/sclera: Conjunctivae normal.  Cardiovascular:     Rate and Rhythm: Regular rhythm. Bradycardia present.     Heart sounds: Normal heart sounds, S1 normal and S2 normal. No murmur heard.   No gallop.  Comments: EKG- Sinus bradycardia, 57 bpm No ST/T wave changes No LVH or Q waves Pulmonary:     Effort: Pulmonary effort is normal.     Breath sounds: No stridor. No wheezing, rhonchi or rales.  Abdominal:     General: Abdomen is flat.     Palpations: There is no mass.     Tenderness: There is no abdominal tenderness. There is no guarding or rebound.     Hernia: No hernia is present.  Musculoskeletal:     Cervical back: Neck supple.     Right lower leg: No edema.     Left lower leg: No edema.  Lymphadenopathy:     Cervical: No cervical adenopathy.  Skin:    General: Skin is warm and dry.  Neurological:     General: No focal deficit present.     Mental Status: He is oriented to person, place, and time. Mental status is at baseline.  Psychiatric:        Mood and Affect: Mood normal.        Behavior: Behavior normal.    Lab Results  Component Value Date   WBC 5.5 01/09/2021   HGB 13.5 01/09/2021   HCT 38.4 (L) 01/09/2021   PLT 229.0 01/09/2021   GLUCOSE 104 (H) 01/09/2021   CHOL 191 08/01/2020   TRIG 112 08/01/2020   HDL 49 08/01/2020    LDLCALC 120 (H) 08/01/2020   ALT 14 11/01/2020   AST 16 11/01/2020   NA 138 01/09/2021   K 3.9 01/09/2021   CL 103 01/09/2021   CREATININE 0.82 01/09/2021   BUN 15 01/09/2021   CO2 24 01/09/2021   TSH 5.68 (H) 01/09/2021   PSA 12.9 05/26/2020   HGBA1C 5.5 01/27/2020     Assessment & Plan:   Scott [name] was seen today for annual exam, anemia and hypertension.  Diagnoses and all orders for this visit:  Hypertension, unspecified type- Will check labs to screen for endorgan damage and secondary causes.  I have asked him to start taking an ARB. -     EKG 12-Lead -     Aldosterone + renin activity w/ ratio; Future -     Basic metabolic panel; Future -     Urinalysis, Routine w reflex microscopic; Future -     Urinalysis, Routine w reflex microscopic -     Basic metabolic panel -     Aldosterone + renin activity w/ ratio -     olmesartan (BENICAR) 20 MG tablet; Take 1 tablet (20 mg total) by mouth daily.  DOE (dyspnea on exertion)- His EKG and labs are reassuring.  I recommended that he undergo a CT cardiac score to screen for atherosclerosis. -     EKG 12-Lead -     Brain natriuretic peptide; Future -     Troponin I (High Sensitivity); Future -     Troponin I (High Sensitivity) -     Brain natriuretic peptide -     CT CARDIAC SCORING (SELF PAY ONLY); Future  Low TSH level- His TSH is slightly elevated now.  His other TFTs are normal.  I think he has subclinical hypothyroidism. -     Thyroid Panel With TSH; Future -     Thyroid Panel With TSH  Deficiency anemia- His anemia has improved.  Most of his vitamin levels are normal though his thiamine level is in the low normal range.  I recommended that he start taking a thiamine supplement. -  CBC with Differential/Platelet; Future -     IBC + Ferritin; Future -     Vitamin B12; Future -     Folate; Future -     Vitamin B1; Future -     Vitamin B1 -     Folate -     Vitamin B12 -     IBC + Ferritin -     CBC with  Differential/Platelet  Screen for colon cancer -     Cologuard  Encounter for general adult medical examination with abnormal findings- Exam completed, labs reviewed, vaccines reviewed and updated, cancer screenings are up-to-date, patient education was given.  Hyperlipidemia with target LDL less than 100- I recommend that he take a statin for cardiovascular risk reduction. -     rosuvastatin (CRESTOR) 10 MG tablet; Take 1 tablet (10 mg total) by mouth daily.  Acquired hypothyroidism- His TSH is slightly elevated at 5.68.  Will recheck in 2 to 3 months.  Anemia due to acquired thiamine deficiency -     thiamine (VITAMIN B-1) 50 MG tablet; Take 1 tablet (50 mg total) by mouth daily.  Other orders -     Flu Vaccine QUAD 6+ mos PF IM (Fluarix Quad PF)  I have discontinued Trevor Mace "Will"'s OVER THE COUNTER MEDICATION, ASTRAGALUS PO, OVER THE COUNTER MEDICATION, OVER THE COUNTER MEDICATION, cimetidine, Turmeric Curcumin, traMADol, NP Thyroid, and triamcinolone cream. I am also having him start on rosuvastatin, olmesartan, and thiamine. Additionally, I am having him maintain his Cholecalciferol, Lupron Depot (61-Month), Zinc, VITAMIN K PO, QUERCETIN PO, MAGNESIUM PO, MILK THISTLE PO, NATTOKINASE PO, ibuprofen, MELATONIN PO, DANDELION ROOT PO, dutasteride, arginine, IRON HEME POLYPEPTIDE PO, predniSONE, estradiol, abiraterone acetate, and pantoprazole.  Meds ordered this encounter  Medications   rosuvastatin (CRESTOR) 10 MG tablet    Sig: Take 1 tablet (10 mg total) by mouth daily.    Dispense:  90 tablet    Refill:  1   olmesartan (BENICAR) 20 MG tablet    Sig: Take 1 tablet (20 mg total) by mouth daily.    Dispense:  90 tablet    Refill:  0   thiamine (VITAMIN B-1) 50 MG tablet    Sig: Take 1 tablet (50 mg total) by mouth daily.    Dispense:  90 tablet    Refill:  1      Follow-up: Return in about 6 weeks (around 02/20/2021).  Scarlette Calico, MD

## 2021-01-15 LAB — VITAMIN B1: Vitamin B1 (Thiamine): 8 nmol/L (ref 8–30)

## 2021-01-15 LAB — THYROID PANEL WITH TSH
Free Thyroxine Index: 2.2 (ref 1.4–3.8)
T3 Uptake: 32 % (ref 22–35)
T4, Total: 6.9 ug/dL (ref 4.9–10.5)
TSH: 5.68 mIU/L — ABNORMAL HIGH (ref 0.40–4.50)

## 2021-01-15 LAB — ALDOSTERONE + RENIN ACTIVITY W/ RATIO
ALDO / PRA Ratio: 8.3 Ratio (ref 0.9–28.9)
Aldosterone: 4 ng/dL
Renin Activity: 0.48 ng/mL/h (ref 0.25–5.82)

## 2021-01-16 DIAGNOSIS — E519 Thiamine deficiency, unspecified: Secondary | ICD-10-CM | POA: Insufficient documentation

## 2021-01-16 DIAGNOSIS — D538 Other specified nutritional anemias: Secondary | ICD-10-CM | POA: Insufficient documentation

## 2021-01-16 MED ORDER — VITAMIN B-1 50 MG PO TABS: 50.0000 mg | ORAL_TABLET | Freq: Every day | ORAL | 1 refills | Status: DC

## 2021-01-18 ENCOUNTER — Ambulatory Visit: Payer: BC Managed Care – PPO | Admitting: Internal Medicine

## 2021-01-22 LAB — COLOGUARD: COLOGUARD: NEGATIVE

## 2021-01-27 ENCOUNTER — Other Ambulatory Visit (HOSPITAL_COMMUNITY): Payer: Self-pay | Admitting: Hematology

## 2021-01-27 DIAGNOSIS — C61 Malignant neoplasm of prostate: Secondary | ICD-10-CM

## 2021-02-07 ENCOUNTER — Other Ambulatory Visit (HOSPITAL_COMMUNITY): Payer: Self-pay

## 2021-02-07 DIAGNOSIS — C61 Malignant neoplasm of prostate: Secondary | ICD-10-CM

## 2021-02-08 ENCOUNTER — Other Ambulatory Visit: Payer: Self-pay

## 2021-02-08 ENCOUNTER — Inpatient Hospital Stay (HOSPITAL_COMMUNITY): Payer: BC Managed Care – PPO | Attending: Hematology

## 2021-02-08 DIAGNOSIS — C7951 Secondary malignant neoplasm of bone: Secondary | ICD-10-CM | POA: Diagnosis not present

## 2021-02-08 DIAGNOSIS — N1330 Unspecified hydronephrosis: Secondary | ICD-10-CM | POA: Insufficient documentation

## 2021-02-08 DIAGNOSIS — Z79899 Other long term (current) drug therapy: Secondary | ICD-10-CM | POA: Diagnosis not present

## 2021-02-08 DIAGNOSIS — E039 Hypothyroidism, unspecified: Secondary | ICD-10-CM | POA: Diagnosis not present

## 2021-02-08 DIAGNOSIS — Z803 Family history of malignant neoplasm of breast: Secondary | ICD-10-CM | POA: Diagnosis not present

## 2021-02-08 DIAGNOSIS — M545 Low back pain, unspecified: Secondary | ICD-10-CM | POA: Diagnosis not present

## 2021-02-08 DIAGNOSIS — C61 Malignant neoplasm of prostate: Secondary | ICD-10-CM | POA: Insufficient documentation

## 2021-02-08 DIAGNOSIS — G629 Polyneuropathy, unspecified: Secondary | ICD-10-CM | POA: Insufficient documentation

## 2021-02-08 DIAGNOSIS — R232 Flushing: Secondary | ICD-10-CM | POA: Insufficient documentation

## 2021-02-08 DIAGNOSIS — Z809 Family history of malignant neoplasm, unspecified: Secondary | ICD-10-CM | POA: Diagnosis not present

## 2021-02-08 DIAGNOSIS — Z8042 Family history of malignant neoplasm of prostate: Secondary | ICD-10-CM | POA: Diagnosis not present

## 2021-02-08 DIAGNOSIS — Z87891 Personal history of nicotine dependence: Secondary | ICD-10-CM | POA: Diagnosis not present

## 2021-02-08 DIAGNOSIS — C779 Secondary and unspecified malignant neoplasm of lymph node, unspecified: Secondary | ICD-10-CM | POA: Diagnosis not present

## 2021-02-08 LAB — COMPREHENSIVE METABOLIC PANEL
ALT: 14 U/L (ref 0–44)
AST: 14 U/L — ABNORMAL LOW (ref 15–41)
Albumin: 4 g/dL (ref 3.5–5.0)
Alkaline Phosphatase: 76 U/L (ref 38–126)
Anion gap: 8 (ref 5–15)
BUN: 13 mg/dL (ref 8–23)
CO2: 21 mmol/L — ABNORMAL LOW (ref 22–32)
Calcium: 8.7 mg/dL — ABNORMAL LOW (ref 8.9–10.3)
Chloride: 107 mmol/L (ref 98–111)
Creatinine, Ser: 0.78 mg/dL (ref 0.61–1.24)
GFR, Estimated: >60 mL/min (ref >60)
Glucose, Bld: 107 mg/dL — ABNORMAL HIGH (ref 70–99)
Potassium: 3.7 mmol/L (ref 3.5–5.1)
Sodium: 136 mmol/L (ref 135–145)
Total Bilirubin: 0.9 mg/dL (ref 0.3–1.2)
Total Protein: 6.4 g/dL — ABNORMAL LOW (ref 6.5–8.1)

## 2021-02-08 LAB — CBC WITH DIFFERENTIAL/PLATELET
Abs Immature Granulocytes: 0.02 10*3/uL (ref 0.00–0.07)
Basophils Absolute: 0.1 10*3/uL (ref 0.0–0.1)
Basophils Relative: 1 %
Eosinophils Absolute: 0.1 10*3/uL (ref 0.0–0.5)
Eosinophils Relative: 2 %
HCT: 34.8 % — ABNORMAL LOW (ref 39.0–52.0)
Hemoglobin: 12.4 g/dL — ABNORMAL LOW (ref 13.0–17.0)
Immature Granulocytes: 0 %
Lymphocytes Relative: 23 %
Lymphs Abs: 1.3 10*3/uL (ref 0.7–4.0)
MCH: 32.6 pg (ref 26.0–34.0)
MCHC: 35.6 g/dL (ref 30.0–36.0)
MCV: 91.6 fL (ref 80.0–100.0)
Monocytes Absolute: 0.5 10*3/uL (ref 0.1–1.0)
Monocytes Relative: 9 %
Neutro Abs: 3.6 10*3/uL (ref 1.7–7.7)
Neutrophils Relative %: 65 %
Platelets: 203 10*3/uL (ref 150–400)
RBC: 3.8 MIL/uL — ABNORMAL LOW (ref 4.22–5.81)
RDW: 12.1 % (ref 11.5–15.5)
WBC: 5.7 10*3/uL (ref 4.0–10.5)
nRBC: 0 % (ref 0.0–0.2)

## 2021-02-08 LAB — PSA
PSA: 1.12
Prostatic Specific Antigen: 1.12 ng/mL (ref 0.00–4.00)

## 2021-02-08 LAB — TSH: TSH: 2.578 u[IU]/mL (ref 0.350–4.500)

## 2021-02-14 NOTE — Progress Notes (Signed)
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 TURP on 11/10/2019. 2. Docetaxel x 6 cycles from 11/16/2019 to 02/29/2020.  NGS Results: not done  CURRENT THERAPY: Zytiga 1,000 mg daily  BRIEF ONCOLOGIC HISTORY:  Oncology History  Prostate cancer metastatic to multiple sites [facility])  01/25/2017 Initial Diagnosis   Prostate cancer metastatic to multiple sites [facility])   11/16/2019 -  Chemotherapy   The patient had ondansetron (ZOFRAN) injection 8 mg, 8 mg (100 % of original dose 8 mg), Intravenous,  Once, 6 of 6 cycles Dose modification: 8 mg (original dose 8 mg, Cycle 1) pegfilgrastim-jmdb (FULPHILA) injection 6 mg, 6 mg, Subcutaneous,  Once, 6 of 6 cycles Administration: 6 mg (11/18/2019), 6 mg (12/09/2019), 6 mg (12/30/2019), 6 mg (01/20/2020), 6 mg (02/10/2020) ondansetron (ZOFRAN) 8 mg in sodium chloride 0.9 % 50 mL IVPB, , Intravenous,  Once, 4 of 4 cycles Administration: 8 mg (12/28/2019), 8 mg (01/18/2020), 8 mg (02/29/2020) DOCEtaxel (TAXOTERE) 140 mg in sodium chloride 0.9 % 250 mL chemo infusion, 75 mg/m2 = 140 mg, Intravenous,  Once, 6 of 6 cycles Administration: 140 mg (11/16/2019), 140 mg (12/07/2019), 140 mg (12/28/2019), 140 mg (01/18/2020), 110 mg (02/08/2020), 110 mg (02/29/2020)   for chemotherapy treatment.      Genetic Testing   Negative genetic testing. No pathogenic variants identified on the [facility] CancerNext-Expanded panel. VUS in [place] identified called [id number]. The report date is 04/19/2020.  The CancerNext-Expanded  gene panel offered by [facility] and includes sequencing and rearrangement analysis for the following 77 genes: IP, ALK, APC*, ATM*, AXIN2, BAP1, BARD1, BLM, BMPR1A, BRCA1*, BRCA2*, BRIP1*, CDC73,  CDH1*,CDK4, CDKN1B, CDKN2A, CHEK2*, CTNNA1, DICER1, FANCC, FH, FLCN, GALNT12, KIF1B, LZTR1, MAX, MEN1, MET, MLH1*, MSH2*, MSH3, MSH6*, MUTYH*, NBN, NF1*, NF2, NTHL1, PALB2*, PHOX2B, PMS2*, POT1, PRKAR1A, PTCH1, PTEN*, RAD51C*, RAD51D*,RB1, RECQL, RET, SDHA, SDHAF2, SDHB, SDHC, SDHD, SMAD4, SMARCA4, SMARCB1, SMARCE1, STK11, SUFU, TMEM127, TP53*,TSC1, TSC2, VHL and XRCC2 (sequencing and deletion/duplication); EGFR, EGLN1, HOXB13, KIT, MITF, PDGFRA, POLD1 and POLE (sequencing only); EPCAM and GREM1 (deletion/duplication only).      CANCER STAGING:  Cancer Staging  No matching staging information was found for the patient.  INTERVAL HISTORY:  [name], a 65 y.o. 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 Scott [name] was last seen on 11/08/2020.   Today he reports feeling well. He presented to the ED on 11/14/2020 at which time he was diagnosed with herpes zoster which improved after 5 weeks although rash is still present on his left lower back. He is taking Zytiga and tolerating it well although he reports continues hot flashes and fatigue. He reports back pain.   REVIEW OF SYSTEMS:  Review of Systems  Constitutional:  Positive for fatigue (40%). Negative for appetite change.  Respiratory:  Positive for shortness of breath.   Endocrine: Positive for hot flashes.  Musculoskeletal:  Positive for back pain (3/10).  Skin:  Positive for rash.  All other systems reviewed and are negative.  PAST MEDICAL/SURGICAL HISTORY:  Past Medical History:  Diagnosis Date   Bladder obstruction    Complication of anesthesia    Diverticulitis 2015   Dyspnea    Essential hypertension, benign 01/26/2019   at [place]  office elevated, normal at home, no medications at this time   History of basal cell cancer    HLD (hyperlipidemia) 01/26/2019   Hypothyroidism    history of, not currently taking medication   PONV (postoperative nausea and vomiting)     Prostate cancer ([place])    metastatic to bones   Vitamin D deficiency disease 01/26/2019   Past Surgical History:  Procedure Laterality Date   CYSTOSCOPY W/ URETERAL STENT PLACEMENT Left 11/10/2019   Procedure: CYSTOSCOPY;  Surgeon: Irine Seal, MD;  Location: [facility];  Service: Urology;  Laterality: Left;   ESOPHAGOGASTRODUODENOSCOPY (EGD) WITH PROPOFOL N/A 07/07/2020   Procedure: ESOPHAGOGASTRODUODENOSCOPY (EGD) WITH PROPOFOL;  Surgeon: Daneil Dolin, MD;  Location: [facility];  Service: Endoscopy;  Laterality: N/A;  pm appt   HERNIA REPAIR  [id number]   UMBILICAL    MALONEY DILATION N/A 07/07/2020   Procedure: MALONEY DILATION;  Surgeon: Daneil Dolin, MD;  Location: [facility];  Service: Endoscopy;  Laterality: N/A;   PROSTATE SURGERY     SKIN CANCER EXCISION     TONSILLECTOMY Bilateral    TOOTH EXTRACTION     front left tooth pulled but no surgery or stitches   TRANSURETHRAL RESECTION OF PROSTATE N/A 01/25/2017   Procedure: TRANSURETHRAL RESECTION OF THE PROSTATE (TURP);  Surgeon: Alexis Frock, MD;  Location: [facility];  Service: Urology;  Laterality: N/A;   TRANSURETHRAL RESECTION OF PROSTATE N/A 11/10/2019   Procedure: TRANSURETHRAL RESECTION OF THE PROSTATE (TURP);  Surgeon: Irine Seal, MD;  Location: [facility];  Service: Urology;  Laterality: N/A;   WISDOM TOOTH EXTRACTION      SOCIAL HISTORY:  Social History   Socioeconomic History   Marital status: Married    Spouse name: Not on file   Number of children: 1   Years of education: Not on file   Highest education level: Not on file  Occupational History    Comment: working full time  Tobacco Use   Smoking status: Former    Packs/day: 1.50    Years: 35.00    Pack years: 52.50    Types: Cigarettes    Quit date: 01/12/2004    Years since quitting: 17.1   Smokeless tobacco: Never   Tobacco comments:    OVER 30 YEARS HX OF SMOKING   Vaping Use   Vaping Use: Never used  Substance and Sexual Activity   Alcohol use: Not  Currently    Comment: heavy drinker until 2004   Drug use: Not Currently    Types: Marijuana   Sexual activity: Not Currently  Other Topics Concern   Not on file  Social History Narrative   Married for 19 years.Works for [employer] from home.   Social Determinants of Health   Financial Resource Strain: Low Risk    Difficulty of Paying Living Expenses: Not hard at all  Food Insecurity: No Food Insecurity   Worried About Charity fundraiser in the Last Year: Never true   [place] in the Last Year: Never true  Transportation Needs: No Transportation Needs   Lack of Transportation (Medical): No   Lack of Transportation (Non-Medical): No  Physical Activity: Inactive   Days of Exercise per Week: 0 days   Minutes of Exercise per Session: 0 min  Stress: No Stress Concern Present   Feeling of Stress : Only a little  Social Connections: Moderately Integrated   Frequency of Communication with Friends and Family: Three times a week  Frequency of Social Gatherings with Friends and Family: Never   Attends Religious Services: Never   Marine scientist or Organizations: Yes   Attends Music therapist: 1 to 4 times per year   Marital Status: Married  Human resources officer Violence: Not At Risk   Fear of Current or Ex-Partner: No   Emotionally Abused: No   Physically Abused: No   Sexually Abused: No    FAMILY HISTORY:  Family History  Problem Relation Age of Onset   Emphysema Mother    Alzheimer's disease Father    Prostate cancer Father        dx in his 76s   Prostate cancer Brother        dx in his 46s   Breast cancer Maternal Grandmother        60s   Pancreatic cancer Maternal Grandfather        64s   Dementia Paternal Grandmother    Heart attack Paternal Grandfather    Colon cancer Neg Hx     CURRENT MEDICATIONS:  Current Outpatient Medications  Medication Sig Dispense Refill   abiraterone acetate (ZYTIGA) 250 MG tablet TAKE 4 TABLETS DAILY ON  AN EMPTY STOMACH, 1 HOUR BEFORE OR 2 HOURS AFTER A MEAL. 120 tablet 1   arginine 500 MG tablet Take 500 mg by mouth 2 (two) times daily.     Cholecalciferol 125 MCG (5000 UT) capsule Take 5,000-10,000 Units by mouth See admin instructions. Take 10000 units in the morning and 5000 units at night     DANDELION ROOT PO Take 500 mg by mouth daily.     dutasteride (AVODART) 0.5 MG capsule Take 1 capsule (0.5 mg total) by mouth daily. 30 capsule 11   estradiol (VIVELLE-DOT) 0.1 MG/24HR patch Place 1 patch (0.1 mg total) onto the skin 2 (two) times a week. 8 patch 12   Leuprolide Acetate, 6 Month, (LUPRON DEPOT, 75-MONTH,) 45 MG injection Inject 45 mg into the muscle every 6 (six) months.     MAGNESIUM PO Take 1 tablet by mouth at bedtime.     MELATONIN PO Take 40 mg by mouth at bedtime.     MILK THISTLE PO Take 1 capsule by mouth daily.     NATTOKINASE PO Take 2 capsules by mouth daily.     NP THYROID 120 MG tablet Take 120 mg by mouth every morning.     olmesartan (BENICAR) 20 MG tablet Take 1 tablet (20 mg total) by mouth daily. 90 tablet 0   pantoprazole (PROTONIX) 40 MG tablet TAKE 1 TABLET BY MOUTH DAILY 30 tablet 5   predniSONE (DELTASONE) 5 MG tablet Take 1 tablet (5 mg total) by mouth daily with breakfast. 30 tablet 6   QUERCETIN PO Take 1 tablet by mouth 2 (two) times daily.     rosuvastatin (CRESTOR) 10 MG tablet Take 1 tablet (10 mg total) by mouth daily. 90 tablet 1   thiamine (VITAMIN B-1) 50 MG tablet Take 1 tablet (50 mg total) by mouth daily. 90 tablet 1   VITAMIN K PO Take 1 capsule by mouth every other day.     Zinc 30 MG CAPS Take 30 mg by mouth daily.     ibuprofen (ADVIL) 200 MG tablet Take 400 mg by mouth every 6 (six) hours as needed for moderate pain. (Patient not taking: Reported on 02/15/2021)     No current facility-administered medications for this visit.   Facility-Administered Medications Ordered in Other Visits  Medication  Dose Route Frequency Provider Last Rate Last  Admin   heparin lock flush 100 unit/mL  500 Units Intracatheter Once PRN [name], MD       sodium chloride flush (NS) 0.9 % injection 10 mL  10 mL Intracatheter PRN [name], MD        ALLERGIES:  Allergies  Allergen Reactions   Ivp Dye [Iodinated Diagnostic Agents] Swelling    Swelling on head, took Benadryl and swelling reduced   Wellbutrin [Bupropion] Hives    PHYSICAL EXAM:  Performance status (ECOG): 0 - Asymptomatic  Vitals:   02/15/21 1440  BP: 136/73  Pulse: 95  Resp: 18  Temp: (!) 96.8 F (36 C)  SpO2: 98%   Wt Readings from Last 3 Encounters:  02/15/21 194 lb 9.6 oz (88.3 kg)  01/09/21 189 lb (85.7 kg)  11/08/20 189 lb 6.4 oz (85.9 kg)   Physical Exam Vitals reviewed.  Constitutional:      Appearance: Normal appearance.  Cardiovascular:     Rate and Rhythm: Normal rate and regular rhythm.     Pulses: Normal pulses.     Heart sounds: Normal heart sounds.  Pulmonary:     Effort: Pulmonary effort is normal.     Breath sounds: Normal breath sounds.  Neurological:     General: No focal deficit present.     Mental Status: He is alert and oriented to person, place, and time.  Psychiatric:        Mood and Affect: Mood normal.        Behavior: Behavior normal.     LABORATORY DATA:  I have reviewed the labs as listed.  CBC Latest Ref Rng & Units 02/08/2021 01/09/2021 11/01/2020  WBC 4.0 - 10.5 K/uL 5.7 5.5 5.2  Hemoglobin 13.0 - 17.0 g/dL 12.4(L) 13.5 12.8(L)  Hematocrit 39.0 - 52.0 % 34.8(L) 38.4(L) 35.9(L)  Platelets 150 - 400 K/uL 203 229.0 206   CMP Latest Ref Rng & Units 02/08/2021 01/09/2021 11/01/2020  Glucose 70 - 99 mg/dL 107(H) 104(H) 103(H)  BUN 8 - 23 mg/dL 13 15 16   Creatinine 0.61 - 1.24 mg/dL 0.78 0.82 0.69  Sodium 135 - 145 mmol/L 136 138 133(L)  Potassium 3.5 - 5.1 mmol/L 3.7 3.9 3.6  Chloride 98 - 111 mmol/L 107 103 105  CO2 22 - 32 mmol/L 21(L) 24 21(L)  Calcium 8.9 - 10.3 mg/dL 8.7(L) 9.3 8.7(L)  Total  Protein 6.5 - 8.1 g/dL 6.4(L) - 6.2(L)  Total Bilirubin 0.3 - 1.2 mg/dL 0.9 - 1.1  Alkaline Phos 38 - 126 U/L 76 - 66  AST 15 - 41 U/L 14(L) - 16  ALT 0 - 44 U/L 14 - 14    DIAGNOSTIC IMAGING:  I have independently reviewed the scans and discussed with the patient. No results found.   ASSESSMENT:  1.  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  Prominent left hydronephrosis and hydroureter noted.  New sclerotic lesion in the left anterior vertebral body of L1.  Mild left periaortic adenopathy increased from prior, short axis lymph node measuring 1 cm. -Last PSA increased to 15.8 on 09/28/2019. -As there is rapid progression of disease, I have recommended docetaxel chemotherapy.  He is agreeable after long discussion. -Cystoscopy with transurethral resection of tumor in the posterior bladder neck, right and left walls of the bladder, right lobe  of the prostate on 11/10/2019. -Plan was to do 6 cycles of docetaxel followed by abiraterone and prednisone. -6 cycles of docetaxel from 11/16/2019 through 02/29/2020. -CTAP on 01/06/2020 showed persistent thickening of the urinary bladder wall.  Left hydronephrosis is significantly reduced compared to prior exam.  Left-sided retroperitoneal and left iliac lymph nodes reduced in size.  Interval increase in sclerosis of lesions of the left aspect of L2 vertebral body consistent with treatment response.  No new bone lesions. -Bone scan on 01/06/2020 shows stable L2 metastatic focus. -Bone scan scan images from 06/14/2020 which showed progressive metastatic disease, with increased size and intensity of L2 1 new focus of activity at L1. 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 - Abiraterone and prednisone started on 06/24/2020.   2.  Left hydroureteronephrosis: -CT scan showed prostate mass invading  the posterior bladder and obstructing the ureter.  Creatinine increased to 1.1. -Renal ultrasound on 12/11/2019 showed interval resolution of previously seen left-sided hydronephrosis.  Previously seen soft tissue mass at the posterior bladder lumen no longer seen.   PLAN:  1.  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  He had 5 pound weight gain. - He has some tiredness. - We reviewed labs from 02/08/2021.  PSA is 1.12, up from 0.89 in August.  LFTs are within normal limits.  CBC was grossly normal. - Last Eligard 45 mg on 12/01/2020. - I would continue Abiraterone 1000 mg daily along with prednisone 5 mg daily. - RTC 3 months with repeat PSA, testosterone levels.   2.  Hot flashes: - Continue estradiol patch 0.1 mg twice weekly.  It is helping with hot flashes.   3.  Peripheral neuropathy: - Left foot tingling is stable.   4.  Low back pain: - He does not report any pain.  He had reported mild discomfort.  We will closely monitor.  5.  Hypothyroidism: - Continue NP thyroid 120 mg daily.  TSH today is 2.5.  6.  Bone health: - We had previously discussed about initiating on bisphosphonates/denosumab.  He was not keen on it.  However we have agreed to check on his bone density test prior to next visit.  He would like to have it done prior to the end of the year.  We will proceed with it.  We will act on it at next visit.   Orders placed this encounter:  Orders Placed This Encounter  Procedures   DG Bone Density   CBC with Differential/Platelet   Comprehensive metabolic panel   PSA   Testosterone     [name], MD [place] [phone]   I, Thana Ates, am acting as a scribe for Dr. [name].  I, [name] MD, have reviewed the above documentation for accuracy and completeness, and I agree with the above.

## 2021-02-15 ENCOUNTER — Inpatient Hospital Stay (HOSPITAL_BASED_OUTPATIENT_CLINIC_OR_DEPARTMENT_OTHER): Payer: BC Managed Care – PPO | Admitting: Hematology

## 2021-02-15 ENCOUNTER — Other Ambulatory Visit: Payer: Self-pay

## 2021-02-15 VITALS — BP 136/73 | HR 95 | Temp 96.8°F | Resp 18 | Wt 194.6 lb

## 2021-02-15 DIAGNOSIS — C61 Malignant neoplasm of prostate: Secondary | ICD-10-CM

## 2021-02-15 NOTE — Patient Instructions (Signed)
[place] at [facility] Discharge Instructions  You were seen and examined today by Dr. Delton Coombes. He reviewed your most recent labs your PSA is slightly elevated and your hemoglobin is slightly low. The decrease in your hemoglobin is more than likely coming from treatment. Continue taking the Zytiga and Prednisone as prescribed. Dr. Delton Coombes is ordering a bone density test to see if shots are needed yet. Please keep follow up as scheduled.    Thank you for choosing [place] at [facility] to provide your oncology and hematology care.  To afford each patient quality time with our provider, please arrive at least 15 minutes before your scheduled appointment time.   If you have a lab appointment with the [facility] please come in thru the Main Entrance and check in at the main information desk.  You need to re-schedule your appointment should you arrive 10 or more minutes late.  We strive to give you quality time with our providers, and arriving late affects you and other patients whose appointments are after yours.  Also, if you no show three or more times for appointments you may be dismissed from the clinic at the providers discretion.     Again, thank you for choosing [facility].  Our hope is that these requests will decrease the amount of time that you wait before being seen by our physicians.       _____________________________________________________________  Should you have questions after your visit to [facility], please contact our office at [phone] and follow the prompts.  Our office hours are 8:00 a.m. and 4:30 p.m. Monday - Friday.  Please note that voicemails left after 4:00 p.m. may not be returned until the following business day.  We are closed weekends and major holidays.  You do have access to a nurse 24-7, just call the main number to the clinic [phone] and do not press any options, hold on  the line and a nurse will answer the phone.    For prescription refill requests, have your pharmacy contact our office and allow 72 hours.    Due to Covid, you will need to wear a mask upon entering the hospital. If you do not have a mask, a mask will be given to you at the Main Entrance upon arrival. For doctor visits, patients may have 1 support person age 97 or older with them. For treatment visits, patients can not have anyone with them due to social distancing guidelines and our immunocompromised population.

## 2021-02-23 ENCOUNTER — Other Ambulatory Visit: Payer: Self-pay

## 2021-02-23 ENCOUNTER — Encounter: Payer: Self-pay | Admitting: Internal Medicine

## 2021-02-23 ENCOUNTER — Ambulatory Visit (INDEPENDENT_AMBULATORY_CARE_PROVIDER_SITE_OTHER): Payer: BC Managed Care – PPO | Admitting: Internal Medicine

## 2021-02-23 VITALS — BP 136/84 | HR 54 | Temp 98.4°F | Resp 16 | Ht 66.0 in | Wt 195.0 lb

## 2021-02-23 DIAGNOSIS — C61 Malignant neoplasm of prostate: Secondary | ICD-10-CM

## 2021-02-23 DIAGNOSIS — I10 Essential (primary) hypertension: Secondary | ICD-10-CM | POA: Diagnosis not present

## 2021-02-23 DIAGNOSIS — E039 Hypothyroidism, unspecified: Secondary | ICD-10-CM

## 2021-02-23 MED ORDER — NP THYROID 120 MG PO TABS: 60.0000 mg | ORAL_TABLET | Freq: Every morning | ORAL | 1 refills | Status: DC

## 2021-02-23 NOTE — Patient Instructions (Signed)

## 2021-02-23 NOTE — Progress Notes (Signed)
Subjective:  Patient ID: Scott [name], male    DOB: 1955-11-11  Age: 65 y.o. MRN: [id number]  CC: Hypothyroidism  This visit occurred during the SARS-CoV-2 public health emergency.  Safety protocols were in place, including screening questions prior to the visit, additional usage of staff PPE, and extensive cleaning of exam room while observing appropriate contact time as indicated for disinfecting solutions.    HPI Ameen Mostafa presents for f/up -   When he had his last TSH level done he was taking 60 mg of day of the thyroid supplement.  He has felt well recently and offers no complaints.  His blood pressure is well controlled and he denies dizziness, lightheadedness, chest pain, shortness of breath, or edema.  Outpatient Medications Prior to Visit  Medication Sig Dispense Refill   abiraterone acetate (ZYTIGA) 250 MG tablet TAKE 4 TABLETS DAILY ON AN EMPTY STOMACH, 1 HOUR BEFORE OR 2 HOURS AFTER A MEAL. 120 tablet 1   arginine 500 MG tablet Take 500 mg by mouth 2 (two) times daily.     Cholecalciferol 125 MCG (5000 UT) capsule Take 5,000-10,000 Units by mouth See admin instructions. Take 10000 units in the morning and 5000 units at night     DANDELION ROOT PO Take 500 mg by mouth daily.     dutasteride (AVODART) 0.5 MG capsule Take 1 capsule (0.5 mg total) by mouth daily. 30 capsule 11   estradiol (VIVELLE-DOT) 0.1 MG/24HR patch Place 1 patch (0.1 mg total) onto the skin 2 (two) times a week. 8 patch 12   ibuprofen (ADVIL) 200 MG tablet Take 400 mg by mouth every 6 (six) hours as needed for moderate pain.     Leuprolide Acetate, 6 Month, (LUPRON DEPOT, 26-MONTH,) 45 MG injection Inject 45 mg into the muscle every 6 (six) months.     MAGNESIUM PO Take 1 tablet by mouth at bedtime.     MELATONIN PO Take 40 mg by mouth at bedtime.     MILK THISTLE PO Take 1 capsule by mouth daily.     NATTOKINASE PO Take 2 capsules by mouth daily.     olmesartan (BENICAR) 20 MG tablet Take 1 tablet (20 mg  total) by mouth daily. 90 tablet 0   pantoprazole (PROTONIX) 40 MG tablet TAKE 1 TABLET BY MOUTH DAILY 30 tablet 5   predniSONE (DELTASONE) 5 MG tablet Take 1 tablet (5 mg total) by mouth daily with breakfast. 30 tablet 6   QUERCETIN PO Take 1 tablet by mouth 2 (two) times daily.     rosuvastatin (CRESTOR) 10 MG tablet Take 1 tablet (10 mg total) by mouth daily. 90 tablet 1   thiamine (VITAMIN B-1) 50 MG tablet Take 1 tablet (50 mg total) by mouth daily. 90 tablet 1   VITAMIN K PO Take 1 capsule by mouth every other day.     Zinc 30 MG CAPS Take 30 mg by mouth daily.     NP THYROID 120 MG tablet Take 120 mg by mouth every morning.     Facility-Administered Medications Prior to Visit  Medication Dose Route Frequency Provider Last Rate Last Admin   heparin lock flush 100 unit/mL  500 Units Intracatheter Once PRN [name], MD       sodium chloride flush (NS) 0.9 % injection 10 mL  10 mL Intracatheter PRN [name], MD        ROS Review of Systems  Constitutional:  Negative for diaphoresis and fatigue.  HENT: Negative.  Respiratory: Negative.  Negative for cough, chest tightness, shortness of breath and wheezing.   Cardiovascular:  Negative for chest pain, palpitations and leg swelling.  Gastrointestinal:  Negative for abdominal pain, constipation, diarrhea, nausea and vomiting.  Endocrine: Negative for cold intolerance and heat intolerance.  Genitourinary: Negative.  Negative for difficulty urinating.  Musculoskeletal:  Negative for arthralgias and myalgias.  Skin: Negative.   Neurological: Negative.  Negative for dizziness, weakness and light-headedness.  Hematological:  Negative for adenopathy. Does not bruise/bleed easily.  Psychiatric/Behavioral: Negative.     Objective:  BP 136/84 (BP Location: Right Arm, Patient Position: Sitting, Cuff Size: Large)   Pulse (!) 54   Temp 98.4 F (36.9 C) (Oral)   Resp 16   Ht 5\' 6"  (1.676 m)   Wt 195 lb (88.5 kg)    SpO2 97%   BMI 31.47 kg/m   BP Readings from Last 3 Encounters:  02/23/21 136/84  02/15/21 136/73  01/09/21 (!) 180/90    Wt Readings from Last 3 Encounters:  02/23/21 195 lb (88.5 kg)  02/15/21 194 lb 9.6 oz (88.3 kg)  01/09/21 189 lb (85.7 kg)    Physical Exam Vitals reviewed.  Constitutional:      Appearance: He is not ill-appearing.  HENT:     Nose: Nose normal.     Mouth/Throat:     Mouth: Mucous membranes are moist.  Eyes:     General: No scleral icterus.    Conjunctiva/sclera: Conjunctivae normal.  Cardiovascular:     Rate and Rhythm: Regular rhythm. Bradycardia present.     Heart sounds: No murmur heard. Pulmonary:     Effort: Pulmonary effort is normal.     Breath sounds: No stridor. No wheezing, rhonchi or rales.  Abdominal:     General: Abdomen is flat.     Palpations: There is no mass.     Tenderness: There is no abdominal tenderness. There is no guarding.     Hernia: No hernia is present.  Musculoskeletal:        General: Normal range of motion.     Cervical back: Neck supple.     Right lower leg: No edema.     Left lower leg: No edema.  Lymphadenopathy:     Cervical: No cervical adenopathy.  Skin:    General: Skin is warm and dry.  Neurological:     General: No focal deficit present.     Mental Status: He is alert.  Psychiatric:        Mood and Affect: Mood normal.        Behavior: Behavior normal.    Lab Results  Component Value Date   WBC 5.7 02/08/2021   HGB 12.4 (L) 02/08/2021   HCT 34.8 (L) 02/08/2021   PLT 203 02/08/2021   GLUCOSE 107 (H) 02/08/2021   CHOL 191 08/01/2020   TRIG 112 08/01/2020   HDL 49 08/01/2020   LDLCALC 120 (H) 08/01/2020   ALT 14 02/08/2021   AST 14 (L) 02/08/2021   NA 136 02/08/2021   K 3.7 02/08/2021   CL 107 02/08/2021   CREATININE 0.78 02/08/2021   BUN 13 02/08/2021   CO2 21 (L) 02/08/2021   TSH 2.578 02/08/2021   PSA 1.12 02/08/2021   HGBA1C 5.5 01/27/2020    No results found.  Assessment &  Plan:   Carr was seen today for hypothyroidism.  Diagnoses and all orders for this visit:  Acquired hypothyroidism- His recent TSH was in the normal range and he  is euthyroid.  Will continue the current dose of the thyroid supplement. -     NP THYROID 120 MG tablet; Take 0.5 tablets (60 mg total) by mouth every morning.  Hypertension, unspecified type- His blood pressure is adequately well controlled.  Prostate cancer metastatic to multiple sites [facility])- His recent PSA was down to 1.12.  I have changed Trevor Mace "Will"'s NP Thyroid. I am also having him maintain his Cholecalciferol, Lupron Depot (20-Month), Zinc, VITAMIN K PO, QUERCETIN PO, MAGNESIUM PO, MILK THISTLE PO, NATTOKINASE PO, ibuprofen, MELATONIN PO, DANDELION ROOT PO, dutasteride, arginine, predniSONE, estradiol, pantoprazole, rosuvastatin, olmesartan, thiamine, and abiraterone acetate. We will stop administering sodium chloride flush.  Meds ordered this encounter  Medications   NP THYROID 120 MG tablet    Sig: Take 0.5 tablets (60 mg total) by mouth every morning.    Dispense:  45 tablet    Refill:  1      Follow-up: Return in about 6 months (around 08/24/2021).  Scarlette Calico, MD

## 2021-02-24 ENCOUNTER — Ambulatory Visit (HOSPITAL_COMMUNITY)
Admission: RE | Admit: 2021-02-24 | Discharge: 2021-02-24 | Disposition: A | Discharge status: Home / Self Care | Payer: BC Managed Care – PPO | Source: Ambulatory Visit | Attending: Hematology | Admitting: Hematology

## 2021-02-24 DIAGNOSIS — C61 Malignant neoplasm of prostate: Secondary | ICD-10-CM | POA: Insufficient documentation

## 2021-02-27 ENCOUNTER — Other Ambulatory Visit (HOSPITAL_COMMUNITY): Payer: Self-pay | Admitting: Hematology

## 2021-02-28 ENCOUNTER — Encounter (HOSPITAL_COMMUNITY): Payer: Self-pay | Admitting: Hematology

## 2021-03-21 ENCOUNTER — Other Ambulatory Visit (HOSPITAL_COMMUNITY): Payer: Self-pay | Admitting: Hematology

## 2021-03-21 DIAGNOSIS — C61 Malignant neoplasm of prostate: Secondary | ICD-10-CM

## 2021-03-22 ENCOUNTER — Ambulatory Visit: Payer: BC Managed Care – PPO | Admitting: Internal Medicine

## 2021-03-23 ENCOUNTER — Ambulatory Visit: Payer: BC Managed Care – PPO | Admitting: Urology

## 2021-04-08 ENCOUNTER — Other Ambulatory Visit: Payer: Self-pay | Admitting: Internal Medicine

## 2021-04-08 DIAGNOSIS — I10 Essential (primary) hypertension: Secondary | ICD-10-CM

## 2021-04-09 ENCOUNTER — Other Ambulatory Visit: Payer: Self-pay | Admitting: Gastroenterology

## 2021-04-22 ENCOUNTER — Other Ambulatory Visit: Payer: Self-pay | Admitting: Urology

## 2021-05-03 ENCOUNTER — Encounter (HOSPITAL_COMMUNITY): Payer: Self-pay | Admitting: Hematology

## 2021-05-03 ENCOUNTER — Other Ambulatory Visit (HOSPITAL_COMMUNITY): Payer: Self-pay

## 2021-05-16 ENCOUNTER — Inpatient Hospital Stay (HOSPITAL_COMMUNITY): Payer: BC Managed Care – PPO | Attending: Hematology

## 2021-05-16 ENCOUNTER — Other Ambulatory Visit: Payer: Self-pay

## 2021-05-16 DIAGNOSIS — C7951 Secondary malignant neoplasm of bone: Secondary | ICD-10-CM | POA: Diagnosis not present

## 2021-05-16 DIAGNOSIS — C779 Secondary and unspecified malignant neoplasm of lymph node, unspecified: Secondary | ICD-10-CM | POA: Insufficient documentation

## 2021-05-16 DIAGNOSIS — C61 Malignant neoplasm of prostate: Secondary | ICD-10-CM | POA: Insufficient documentation

## 2021-05-16 LAB — CBC WITH DIFFERENTIAL/PLATELET
Abs Immature Granulocytes: 0.02 10*3/uL (ref 0.00–0.07)
Basophils Absolute: 0.1 10*3/uL (ref 0.0–0.1)
Basophils Relative: 1 %
Eosinophils Absolute: 0.1 10*3/uL (ref 0.0–0.5)
Eosinophils Relative: 1 %
HCT: 36 % — ABNORMAL LOW (ref 39.0–52.0)
Hemoglobin: 12.8 g/dL — ABNORMAL LOW (ref 13.0–17.0)
Immature Granulocytes: 0 %
Lymphocytes Relative: 20 %
Lymphs Abs: 1 10*3/uL (ref 0.7–4.0)
MCH: 32.1 pg (ref 26.0–34.0)
MCHC: 35.6 g/dL (ref 30.0–36.0)
MCV: 90.2 fL (ref 80.0–100.0)
Monocytes Absolute: 0.4 10*3/uL (ref 0.1–1.0)
Monocytes Relative: 8 %
Neutro Abs: 3.6 10*3/uL (ref 1.7–7.7)
Neutrophils Relative %: 70 %
Platelets: 216 10*3/uL (ref 150–400)
RBC: 3.99 MIL/uL — ABNORMAL LOW (ref 4.22–5.81)
RDW: 12.1 % (ref 11.5–15.5)
WBC: 5.2 10*3/uL (ref 4.0–10.5)
nRBC: 0 % (ref 0.0–0.2)

## 2021-05-16 LAB — COMPREHENSIVE METABOLIC PANEL
ALT: 10 U/L (ref 0–44)
AST: 14 U/L — ABNORMAL LOW (ref 15–41)
Albumin: 4.1 g/dL (ref 3.5–5.0)
Alkaline Phosphatase: 69 U/L (ref 38–126)
Anion gap: 9 (ref 5–15)
BUN: 15 mg/dL (ref 8–23)
CO2: 19 mmol/L — ABNORMAL LOW (ref 22–32)
Calcium: 8.7 mg/dL — ABNORMAL LOW (ref 8.9–10.3)
Chloride: 104 mmol/L (ref 98–111)
Creatinine, Ser: 0.77 mg/dL (ref 0.61–1.24)
GFR, Estimated: >60 mL/min (ref >60)
Glucose, Bld: 114 mg/dL — ABNORMAL HIGH (ref 70–99)
Potassium: 3.7 mmol/L (ref 3.5–5.1)
Sodium: 132 mmol/L — ABNORMAL LOW (ref 135–145)
Total Bilirubin: 1.1 mg/dL (ref 0.3–1.2)
Total Protein: 6.8 g/dL (ref 6.5–8.1)

## 2021-05-16 LAB — PSA: Prostatic Specific Antigen: 1.54 ng/mL (ref 0.00–4.00)

## 2021-05-17 ENCOUNTER — Other Ambulatory Visit (HOSPITAL_COMMUNITY): Payer: Self-pay | Admitting: *Deleted

## 2021-05-17 DIAGNOSIS — C61 Malignant neoplasm of prostate: Secondary | ICD-10-CM

## 2021-05-17 LAB — TESTOSTERONE: Testosterone: <3 ng/dL — ABNORMAL LOW (ref 264–916)

## 2021-05-17 MED ORDER — ABIRATERONE ACETATE 250 MG PO TABS: ORAL_TABLET | ORAL | 1 refills | Status: DC

## 2021-05-23 ENCOUNTER — Inpatient Hospital Stay (HOSPITAL_COMMUNITY): Payer: BC Managed Care – PPO | Attending: Hematology | Admitting: Hematology

## 2021-05-23 ENCOUNTER — Other Ambulatory Visit: Payer: Self-pay

## 2021-05-23 VITALS — BP 141/84 | HR 73 | Temp 98.8°F | Resp 18 | Ht 66.0 in | Wt 189.2 lb

## 2021-05-23 DIAGNOSIS — R232 Flushing: Secondary | ICD-10-CM | POA: Insufficient documentation

## 2021-05-23 DIAGNOSIS — D649 Anemia, unspecified: Secondary | ICD-10-CM | POA: Insufficient documentation

## 2021-05-23 DIAGNOSIS — Z7952 Long term (current) use of systemic steroids: Secondary | ICD-10-CM | POA: Insufficient documentation

## 2021-05-23 DIAGNOSIS — R599 Enlarged lymph nodes, unspecified: Secondary | ICD-10-CM | POA: Insufficient documentation

## 2021-05-23 DIAGNOSIS — C778 Secondary and unspecified malignant neoplasm of lymph nodes of multiple regions: Secondary | ICD-10-CM | POA: Insufficient documentation

## 2021-05-23 DIAGNOSIS — N1330 Unspecified hydronephrosis: Secondary | ICD-10-CM | POA: Insufficient documentation

## 2021-05-23 DIAGNOSIS — C61 Malignant neoplasm of prostate: Secondary | ICD-10-CM | POA: Insufficient documentation

## 2021-05-23 DIAGNOSIS — R5383 Other fatigue: Secondary | ICD-10-CM | POA: Diagnosis not present

## 2021-05-23 DIAGNOSIS — C7951 Secondary malignant neoplasm of bone: Secondary | ICD-10-CM | POA: Insufficient documentation

## 2021-05-23 DIAGNOSIS — G629 Polyneuropathy, unspecified: Secondary | ICD-10-CM | POA: Insufficient documentation

## 2021-05-23 NOTE — Progress Notes (Signed)
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 TURP on 11/10/2019. 2. Docetaxel x 6 cycles from 11/16/2019 to 02/29/2020.  NGS Results: not done  CURRENT THERAPY: Zytiga 1,000 mg daily  BRIEF ONCOLOGIC HISTORY:  Oncology History  Prostate cancer metastatic to multiple sites [facility])  01/25/2017 Initial Diagnosis   Prostate cancer metastatic to multiple sites [facility])   11/16/2019 -  Chemotherapy   The patient had ondansetron (ZOFRAN) injection 8 mg, 8 mg (100 % of original dose 8 mg), Intravenous,  Once, 6 of 6 cycles Dose modification: 8 mg (original dose 8 mg, Cycle 1) pegfilgrastim-jmdb (FULPHILA) injection 6 mg, 6 mg, Subcutaneous,  Once, 6 of 6 cycles Administration: 6 mg (11/18/2019), 6 mg (12/09/2019), 6 mg (12/30/2019), 6 mg (01/20/2020), 6 mg (02/10/2020) ondansetron (ZOFRAN) 8 mg in sodium chloride 0.9 % 50 mL IVPB, , Intravenous,  Once, 4 of 4 cycles Administration: 8 mg (12/28/2019), 8 mg (01/18/2020), 8 mg (02/29/2020) DOCEtaxel (TAXOTERE) 140 mg in sodium chloride 0.9 % 250 mL chemo infusion, 75 mg/m2 = 140 mg, Intravenous,  Once, 6 of 6 cycles Administration: 140 mg (11/16/2019), 140 mg (12/07/2019), 140 mg (12/28/2019), 140 mg (01/18/2020), 110 mg (02/08/2020), 110 mg (02/29/2020)   for chemotherapy treatment.      Genetic Testing   Negative genetic testing. No pathogenic variants identified on the [facility] CancerNext-Expanded panel. 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 The report date is 04/19/2020.  The CancerNext-Expanded  gene panel offered by [facility] and includes sequencing and rearrangement analysis for the following 77 genes: IP, ALK, APC*, ATM*, AXIN2, BAP1, BARD1, BLM, BMPR1A, BRCA1*, BRCA2*, BRIP1*, CDC73,  CDH1*,CDK4, CDKN1B, CDKN2A, CHEK2*, CTNNA1, DICER1, FANCC, FH, FLCN, GALNT12, KIF1B, LZTR1, MAX, MEN1, MET, MLH1*, MSH2*, MSH3, MSH6*, MUTYH*, NBN, NF1*, NF2, NTHL1, PALB2*, PHOX2B, PMS2*, POT1, PRKAR1A, PTCH1, PTEN*, RAD51C*, RAD51D*,RB1, RECQL, RET, SDHA, SDHAF2, SDHB, SDHC, SDHD, SMAD4, SMARCA4, SMARCB1, SMARCE1, STK11, SUFU, TMEM127, TP53*,TSC1, TSC2, VHL and XRCC2 (sequencing and deletion/duplication); EGFR, EGLN1, HOXB13, KIT, MITF, PDGFRA, POLD1 and POLE (sequencing only); EPCAM and GREM1 (deletion/duplication only).      CANCER STAGING:  Cancer Staging  No matching staging information was found for the patient.  INTERVAL HISTORY:  [name], a 66 y.o. 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 Lar[place] was last seen on 02/15/2021.   Today he reports feeling well. He reports Covid infection in December. He reports SOB and fatigue. He denies new pains. He reports rare hot flashes. He continues to use estradiol patch BID.   REVIEW OF SYSTEMS:  Review of Systems  Constitutional:  Positive for fatigue. Negative for appetite change.  Respiratory:  Positive for shortness of breath.   Endocrine: Positive for hot flashes (occasional).  Neurological:  Positive for numbness.  All other systems reviewed and are negative.  PAST MEDICAL/SURGICAL HISTORY:  Past Medical History:  Diagnosis Date   Bladder obstruction    Complication of anesthesia    Diverticulitis 2015   Dyspnea    Essential hypertension, benign 01/26/2019   at [place] office elevated, normal at home, no medications at this time   History of basal cell cancer    HLD (hyperlipidemia) 01/26/2019   Hypothyroidism    history of, not  currently taking medication   PONV (postoperative nausea and vomiting)    Prostate cancer ([place])    metastatic to bones   Vitamin D deficiency disease 01/26/2019   Past Surgical History:  Procedure Laterality Date   CYSTOSCOPY W/  URETERAL STENT PLACEMENT Left 11/10/2019   Procedure: CYSTOSCOPY;  Surgeon: Irine Seal, MD;  Location: [facility];  Service: Urology;  Laterality: Left;   ESOPHAGOGASTRODUODENOSCOPY (EGD) WITH PROPOFOL N/A 07/07/2020   Procedure: ESOPHAGOGASTRODUODENOSCOPY (EGD) WITH PROPOFOL;  Surgeon: Daneil Dolin, MD;  Location: [facility];  Service: Endoscopy;  Laterality: N/A;  pm appt   HERNIA REPAIR  [id number]   UMBILICAL    MALONEY DILATION N/A 07/07/2020   Procedure: MALONEY DILATION;  Surgeon: Daneil Dolin, MD;  Location: [facility];  Service: Endoscopy;  Laterality: N/A;   PROSTATE SURGERY     SKIN CANCER EXCISION     TONSILLECTOMY Bilateral    TOOTH EXTRACTION     front left tooth pulled but no surgery or stitches   TRANSURETHRAL RESECTION OF PROSTATE N/A 01/25/2017   Procedure: TRANSURETHRAL RESECTION OF THE PROSTATE (TURP);  Surgeon: Alexis Frock, MD;  Location: [facility];  Service: Urology;  Laterality: N/A;   TRANSURETHRAL RESECTION OF PROSTATE N/A 11/10/2019   Procedure: TRANSURETHRAL RESECTION OF THE PROSTATE (TURP);  Surgeon: Irine Seal, MD;  Location: [facility];  Service: Urology;  Laterality: N/A;   WISDOM TOOTH EXTRACTION      SOCIAL HISTORY:  Social History   Socioeconomic History   Marital status: Married    Spouse name: Not on file   Number of children: 1   Years of education: Not on file   Highest education level: Not on file  Occupational History    Comment: working full time  Tobacco Use   Smoking status: Former    Packs/day: 1.50    Years: 35.00    Pack years: 52.50    Types: Cigarettes    Quit date: 01/12/2004    Years since quitting: 17.3   Smokeless tobacco: Never   Tobacco comments:    OVER 30 YEARS HX OF SMOKING   Vaping Use   Vaping Use: Never used  Substance and Sexual Activity   Alcohol use: Not Currently    Comment: heavy drinker until 2004   Drug use: Not Currently    Types: Marijuana   Sexual activity: Not Currently  Other Topics Concern   Not on  file  Social History Narrative   Married for 19 years.Works for [employer] from home.   Social Determinants of Health   Financial Resource Strain: Not on file  Food Insecurity: Not on file  Transportation Needs: Not on file  Physical Activity: Not on file  Stress: Not on file  Social Connections: Not on file  Intimate Partner Violence: Not on file    FAMILY HISTORY:  Family History  Problem Relation Age of Onset   Emphysema Mother    Alzheimer's disease Father    Prostate cancer Father        dx in his 27s   Prostate cancer Brother        dx in his 16s   Breast cancer Maternal Grandmother        60s   Pancreatic cancer Maternal Grandfather        34s   Dementia Paternal Grandmother    Heart attack Paternal Grandfather    Colon cancer Neg Hx     CURRENT MEDICATIONS:  Current Outpatient Medications  Medication  Sig Dispense Refill   abiraterone acetate (ZYTIGA) 250 MG tablet Take on an empty stomach 1 hour before or 2 hours after a meal 120 tablet 1   arginine 500 MG tablet Take 500 mg by mouth 2 (two) times daily.     Cholecalciferol 125 MCG (5000 UT) capsule Take 5,000-10,000 Units by mouth See admin instructions. Take 10000 units in the morning and 5000 units at night     DANDELION ROOT PO Take 500 mg by mouth daily.     dutasteride (AVODART) 0.5 MG capsule TAKE 1 CAPSULE(0.5 MG) BY MOUTH DAILY 30 capsule 11   estradiol (VIVELLE-DOT) 0.1 MG/24HR patch Place 1 patch (0.1 mg total) onto the skin 2 (two) times a week. 8 patch 12   ibuprofen (ADVIL) 200 MG tablet Take 400 mg by mouth every 6 (six) hours as needed for moderate pain.     Leuprolide Acetate, 6 Month, (LUPRON DEPOT, 21-MONTH,) 45 MG injection Inject 45 mg into the muscle every 6 (six) months.     MAGNESIUM PO Take 1 tablet by mouth at bedtime.     MELATONIN PO Take 40 mg by mouth at bedtime.     MILK THISTLE PO Take 1 capsule by mouth daily.     NATTOKINASE PO Take 2 capsules by mouth daily.     NP  THYROID 120 MG tablet Take 0.5 tablets (60 mg total) by mouth every morning. 45 tablet 1   olmesartan (BENICAR) 20 MG tablet TAKE 1 TABLET(20 MG) BY MOUTH DAILY 90 tablet 0   pantoprazole (PROTONIX) 40 MG tablet TAKE 1 TABLET BY MOUTH DAILY 30 tablet 5   predniSONE (DELTASONE) 5 MG tablet TAKE 1 TABLET(5 MG) BY MOUTH DAILY WITH BREAKFAST 30 tablet 6   QUERCETIN PO Take 1 tablet by mouth 2 (two) times daily.     rosuvastatin (CRESTOR) 10 MG tablet Take 1 tablet (10 mg total) by mouth daily. 90 tablet 1   thiamine (VITAMIN B-1) 50 MG tablet Take 1 tablet (50 mg total) by mouth daily. 90 tablet 1   VITAMIN K PO Take 1 capsule by mouth every other day.     Zinc 30 MG CAPS Take 30 mg by mouth daily.     No current facility-administered medications for this visit.   Facility-Administered Medications Ordered in Other Visits  Medication Dose Route Frequency Provider Last Rate Last Admin   heparin lock flush 100 unit/mL  500 Units Intracatheter Once PRN [name], MD        ALLERGIES:  Allergies  Allergen Reactions   Ivp Dye [Iodinated Contrast Media] Swelling    Swelling on head, took Benadryl and swelling reduced   Wellbutrin [Bupropion] Hives    PHYSICAL EXAM:  Performance status (ECOG): 0 - Asymptomatic  Vitals:   05/23/21 1412  BP: (!) 141/84  Pulse: 73  Resp: 18  Temp: 98.8 F (37.1 C)  SpO2: 97%   Wt Readings from Last 3 Encounters:  05/23/21 189 lb 3.2 oz (85.8 kg)  02/23/21 195 lb (88.5 kg)  02/15/21 194 lb 9.6 oz (88.3 kg)   Physical Exam Vitals reviewed.  Constitutional:      Appearance: Normal appearance.  Cardiovascular:     Rate and Rhythm: Normal rate and regular rhythm.     Pulses: Normal pulses.     Heart sounds: Normal heart sounds.  Pulmonary:     Effort: Pulmonary effort is normal.     Breath sounds: Normal breath sounds.  Neurological:  General: No focal deficit present.     Mental Status: He is alert and oriented to person, place, and  time.  Psychiatric:        Mood and Affect: Mood normal.        Behavior: Behavior normal.     LABORATORY DATA:  I have reviewed the labs as listed.  CBC Latest Ref Rng & Units 05/16/2021 02/08/2021 01/09/2021  WBC 4.0 - 10.5 K/uL 5.2 5.7 5.5  Hemoglobin 13.0 - 17.0 g/dL 12.8(L) 12.4(L) 13.5  Hematocrit 39.0 - 52.0 % 36.0(L) 34.8(L) 38.4(L)  Platelets 150 - 400 K/uL 216 203 229.0   CMP Latest Ref Rng & Units 05/16/2021 02/08/2021 01/09/2021  Glucose 70 - 99 mg/dL 114(H) 107(H) 104(H)  BUN 8 - 23 mg/dL _0 Creatinine 0.61 - 1.24 mg/dL 0.77 0.78 0.82  Sodium 135 - 145 mmol/L 132(L) 136 138  Potassium 3.5 - 5.1 mmol/L 3.7 3.7 3.9  Chloride 98 - 111 mmol/L 104 107 103  CO2 22 - 32 mmol/L 19(L) 21(L) 24  Calcium 8.9 - 10.3 mg/dL 8.7(L) 8.7(L) 9.3  Total Protein 6.5 - 8.1 g/dL 6.8 6.4(L) -  Total Bilirubin 0.3 - 1.2 mg/dL 1.1 0.9 -  Alkaline Phos 38 - 126 U/L 69 76 -  AST 15 - 41 U/L 14(L) 14(L) -  ALT 0 - 44 U/L 10 14 -    DIAGNOSTIC IMAGING:  I have independently reviewed the scans and discussed with the patient. No results found.   ASSESSMENT:  1.  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  Prominent left hydronephrosis and hydroureter noted.  New sclerotic lesion in the left anterior vertebral body of L1.  Mild left periaortic adenopathy increased from prior, short axis lymph node measuring 1 cm. -Last PSA increased to 15.8 on 09/28/2019. -As there is rapid progression of disease, I have recommended docetaxel chemotherapy.  He is agreeable after long discussion. -Cystoscopy with transurethral resection of tumor in the posterior bladder neck, right and left walls of the bladder, right lobe of the prostate on 11/10/2019. -Plan was to do 6 cycles of docetaxel followed by abiraterone and prednisone. -6 cycles of  docetaxel from 11/16/2019 through 02/29/2020. -CTAP on 01/06/2020 showed persistent thickening of the urinary bladder wall.  Left hydronephrosis is significantly reduced compared to prior exam.  Left-sided retroperitoneal and left iliac lymph nodes reduced in size.  Interval increase in sclerosis of lesions of the left aspect of L2 vertebral body consistent with treatment response.  No new bone lesions. -Bone scan on 01/06/2020 shows stable L2 metastatic focus. -Bone scan scan images from 06/14/2020 which showed progressive metastatic disease, with increased size and intensity of L2 1 new focus of activity at L1. 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 - Abiraterone and prednisone started on 06/24/2020.   2.  Left hydroureteronephrosis: -CT scan showed prostate mass invading the posterior bladder and obstructing the ureter.  Creatinine increased to 1.1. -Renal ultrasound on 12/11/2019 showed interval resolution of previously seen left-sided hydronephrosis.  Previously seen soft tissue mass at the posterior bladder lumen no longer seen.   PLAN:  1.  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 - He has some fatigue which is stable.  No new pains. - Reviewed labs from 05/16/2021.  Mild anemia  with hemoglobin 12.8.  LFTs are normal.  Creatinine was normal. - Testosterone level was less than 3.  PSA was 1.4, up from 1.12 in November.  PSA was 0.89 on November 01, 2020.  Overall PSA is improved from a peak of 18.81 on 06/16/2020 since the start of Abiraterone. - The last 2 levels indicate slow progression versus fluctuations in the PSA levels. - Upon further discussion, we have decided to continue same medication Abiraterone 1000 mg daily and prednisone 5 mg daily. - He will continue Eligard injections with Dr. Jeffie Pollock. - RTC 3 months for follow-up with repeat PSA.   2.  Hot flashes: - Continue estradiol patch 0.1 mg twice weekly.  It is helping  with hot flashes.   3.  Peripheral neuropathy: - Left foot tingling is stable.   4.  Low back pain: - He has occasional low back pain from disc problems.  No changes in his back pains.  5.  Hypothyroidism: - Continue NP thyroid 120 mg half tablet daily.  6.  Bone health: -We have reviewed bone density test from 02/24/2021 with T score -2.5, osteoporosis. - We have previously discussed about denosumab.  He was reluctant to consider. - He is taking vitamin D 10,000 units daily.  I plan to repeat vitamin D level at next visit in 3 months. - I have recommended adding calcium 1200 mg daily.   Orders placed this encounter:  No orders of the defined types were placed in this encounter.    [name], MD [place] [phone]   I, Thana Ates, am acting as a scribe for Dr. [name].  I, [name] MD, have reviewed the above documentation for accuracy and completeness, and I agree with the above.

## 2021-05-23 NOTE — Progress Notes (Signed)
Has follow up 05/23/21

## 2021-05-23 NOTE — Patient Instructions (Addendum)
[place] at [facility] ?Discharge Instructions ? ?You were seen and examined today by Dr. Delton Coombes. He reviewed your most recent labs and everything looks okay except your PSA is slightly increasing. We will keep a watch on this for now and discuss possibly changing treatment if this continues to rise. Continue taking Vitamin D 10,000 units. Continue seeing Dr. Roni Bread for your Eligard injections. Please keep follow up appointments as scheduled in 3 months. ? ? ?Thank you for choosing [place] at [facility] to provide your oncology and hematology care.  To afford each patient quality time with our provider, please arrive at least 15 minutes before your scheduled appointment time.  ? ?If you have a lab appointment with the [facility] please come in thru the Main Entrance and check in at the main information desk. ? ?You need to re-schedule your appointment should you arrive 10 or more minutes late.  We strive to give you quality time with our providers, and arriving late affects you and other patients whose appointments are after yours.  Also, if you no show three or more times for appointments you may be dismissed from the clinic at the providers discretion.     ?Again, thank you for choosing [facility].  Our hope is that these requests will decrease the amount of time that you wait before being seen by our physicians.       ?_____________________________________________________________ ? ?Should you have questions after your visit to [facility], please contact our office at [phone] and follow the prompts.  Our office hours are 8:00 a.m. and 4:30 p.m. Monday - Friday.  Please note that voicemails left after 4:00 p.m. may not be returned until the following business day.  We are closed weekends and major holidays.  You do have access to a nurse 24-7, just call the main number to the clinic [phone] and do not press any  options, hold on the line and a nurse will answer the phone.   ? ?For prescription refill requests, have your pharmacy contact our office and allow 72 hours.   ? ?Due to Covid, you will need to wear a mask upon entering the hospital. If you do not have a mask, a mask will be given to you at the Main Entrance upon arrival. For doctor visits, patients may have 1 support person age 36 or older with them. For treatment visits, patients can not have anyone with them due to social distancing guidelines and our immunocompromised population.  ? ?  ?

## 2021-06-01 ENCOUNTER — Encounter: Payer: Self-pay | Admitting: Urology

## 2021-06-01 ENCOUNTER — Other Ambulatory Visit: Payer: Self-pay

## 2021-06-01 ENCOUNTER — Telehealth (HOSPITAL_COMMUNITY): Payer: Self-pay | Admitting: Pharmacy Technician

## 2021-06-01 ENCOUNTER — Other Ambulatory Visit (HOSPITAL_COMMUNITY): Payer: Self-pay

## 2021-06-01 ENCOUNTER — Ambulatory Visit (INDEPENDENT_AMBULATORY_CARE_PROVIDER_SITE_OTHER): Payer: BC Managed Care – PPO | Admitting: Urology

## 2021-06-01 ENCOUNTER — Encounter (HOSPITAL_COMMUNITY): Payer: Self-pay | Admitting: Hematology

## 2021-06-01 VITALS — BP 169/88 | HR 52 | Ht 66.0 in | Wt 188.0 lb

## 2021-06-01 DIAGNOSIS — C61 Malignant neoplasm of prostate: Secondary | ICD-10-CM | POA: Diagnosis not present

## 2021-06-01 DIAGNOSIS — R9721 Rising PSA following treatment for malignant neoplasm of prostate: Secondary | ICD-10-CM | POA: Diagnosis not present

## 2021-06-01 DIAGNOSIS — R351 Nocturia: Secondary | ICD-10-CM

## 2021-06-01 DIAGNOSIS — N403 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: Secondary | ICD-10-CM

## 2021-06-01 LAB — URINALYSIS, ROUTINE W REFLEX MICROSCOPIC
Bilirubin, UA: NEGATIVE
Glucose, UA: NEGATIVE
Ketones, UA: NEGATIVE
Leukocytes,UA: NEGATIVE
Nitrite, UA: NEGATIVE
Protein,UA: NEGATIVE
RBC, UA: NEGATIVE
Specific Gravity, UA: >1.03 — ABNORMAL HIGH (ref 1.005–1.030)
Urobilinogen, Ur: 0.2 mg/dL (ref 0.2–1.0)
pH, UA: 5.5 (ref 5.0–7.5)

## 2021-06-01 MED ORDER — LEUPROLIDE ACETATE (6 MONTH) 45 MG IM KIT: 45.0000 mg | PACK | Freq: Once | INTRAMUSCULAR | Status: DC

## 2021-06-01 MED ORDER — DUTASTERIDE 0.5 MG PO CAPS: 0.5000 mg | ORAL_CAPSULE | Freq: Every day | ORAL | 3 refills | Status: DC

## 2021-06-01 MED ORDER — LEUPROLIDE ACETATE (6 MONTH) 45 MG ~~LOC~~ KIT
45.0000 mg | PACK | Freq: Once | SUBCUTANEOUS | Status: AC
  Administered 2021-06-01: 45 mg via SUBCUTANEOUS

## 2021-06-01 NOTE — Telephone Encounter (Signed)
Oral Oncology Patient Advocate Encounter ?  ?Received notification from [facility] that the existing prior authorization for Abiraterone through [place] [employer] [place]  is due for renewal. ?  ?Renewal PA submitted on CoverMyMeds ?Key [id number] ?Status is pending ?  ?Oral Oncology Clinic will continue to follow. ? ?Dennison Nancy CPHT ?Specialty Pharmacy Patient Advocate ?[place] ?Phone [phone] ?Fax [phone] ?06/01/2021 3:29 PM ? ?

## 2021-06-01 NOTE — Progress Notes (Signed)
Eligard SubQ Injection  ? ?Due to Prostate Cancer patient is present today for a Eligard Injection. ? ?Medication: Eligard 6 month ?Dose: 45 mg  ?Location: left  ? ?Patient tolerated well, no complications were noted ? ?Performed by: Park Beck LPN ? ?

## 2021-06-01 NOTE — Progress Notes (Signed)
?Subjective: ?06/01/21: Scott [name] returns today for Lupron.  His PSA is slowly rising and was 1.54 earlier this month.  He has fatigue and SOB. He has no further bone pain.   He has a stable weight. He is voiding well with an IPSS of 5-6 with some nocturia 1-2x.   He has had no hematuria and the UA is clear.  He continues to use estradiol and abiaterone with pred.  His hot flashes are well controlled with the estradiol.   He is on no bone targeting agents and is reluctant to add them because of dental issues.  He has started a calcium supplement.   He saw Dr. Delton Coombes on 05/23/21.  ? ?12/01/20: Scott [name] returns today in f/u for Lupron.  He has been dealing with shingles on the left flank and upper leg for the last 3-4 weeks but that is improving.  His last PSA in 8/22 was down to 0.89.  He started Zytiga in 4/22 and had lumbar SBRT in 5/22.   He has done well with that and his bone pain has improved.  He is voiding well with an IPSS of 2-3.  His Alk phos has normalized.   ? 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  His PSA has been rising despite 6 rounds of chemo.   He is seeing Dr. Anastasio Champion and is on oral estradiol 75m po bid but the hot flashes have returned.   He remains on dutasteride and Lupron and his T is  <3 and PSA is up to 12.9 on 05/26/20 from 4.77 on 04/28/20.  He is voiding well and his Cr was 0.8 on 2/10.  He has back pain that improved with chemo and prednisone but it has recurred.   His bone scan on 01/14/20 demonstrated stable L2 mets.  The CT showed improved left hydro with minimal residual bilateral hydro.  He is due for Lupron.  He is voiding well.  His IPSS is 6.  He has a tooth that needs extraction.    ? ?12/04/19: Scott [name] today in f/u from a TURP on 11/10/19 with resection of both UO's for malignant obstruction.   He is voiding better but has some frequency and nocturia 2-4x.  He has some left flank pain with voiding and probably has reflux.   His IPSS is 14.   He has had some  hematuria but is improving.   He has some bone pain with the fulphila given for bone marrow support.   He is otherwise tolerating chemo after one dose.  His Cr is down to 0.9 from 1.1 prior to the resection. ? ? ?GU Hx:  Metastatic Prostate Cancer with rising PSA on ADT - PSA 159.8 by PCP labs on initial intake 12/2016, TRUS BX Gleason 4+5=9 adenocarcioma up up to 95% of 12/12 cores, 80 mL Vol 03/2016. CT with bilateral non-bulky iliac adenopathy and tiny uptake Lt orbit (no spine mets) by bone scan.  His PSA is up to 15.5 from  9.47 at last check and a doubling over the last year. The testosterone remains castrate at <3 on Lupron and dutasteride. He remains on estradiol 0.13mpatches for the hot flashes.   He had a telehealth visit with Dr. MaTammi Klippelor consideration of radiation therapy to the primary and pelvic nodes from the Ax[facility]sET findings on 12/11/18 but decided against that.  He is seeing Dr. KaDelton Coombess well but is  reluctant to consider Zytiga because of the prednisone.  He has had no recent hematuria.  He has occasional frequency with urgency and UUI.  He wears a pad. He has a reduced stream.  He has nocturia 3-4x but he is drinking a lot of fluids.   His IPSS is 29-30.  ? ?Present Management:  ?01/2017 begin Lupron androgen deprivation Q48mo(declined reccomended orchiectomy)  ?04/2017 - PSA 2.86;  ?07/2017 PSA/T 3.44 / T 3 ==> Lupron 42m  ?10/08/17 PSA 2.1.  ?02/07/18 PSA 2.19/T 4. added Estradiol 0.105m78matches twice weekly for the hot flashes.  ?03/10/18 PSA 1.87/T<3.  ?05/09/18 PSA 2.09/T10/ Est 32.  ?07/24/18 PSA 6/T 54  ?08/08/18 Lupron 96m74m?09/08/18 PSA 4.37  ?10/22/18 PSA 4.64/T<3.  ?11/26/18 Bone scan negative/ CT C/A/P negative. ?12/11/18 Axumin PET small equivocal periaortic and left ext iliac nodes and right prostate activity.  I have discussed that with him.  ?11/28/18 PSA 6.46. ?02/10/19 PSA 7.31/T <3. ?03/19/19 PSA 8.20 ?04/11/19  Lupron 96mg34men.  ?04/23/19 PSA 9.96. ?06/23/19 PSA 9.47. ?09/28/19 PSA  15.56, T <3.  ?10/09/19 Lupron 96mg 60mn ?11/10/19  Operative cysto / TURP 2018 with inviltrative bladder neck / prostate mass ( left UO purposefully resected).  ?11/16/19 PSA 20.69.  ? ?11/06/19 [name] at the base of the bladder consistent with local invasion from his CRCP and he has left ureteral obstruction with a minor increase in his Cr.  He has seen Dr. KatragDelton Coombess scheduled to begin chemotherapy but it was felt that decompression of the left kidney was indicated.   He is have increased voiding difficulty with passage of clots as well.   ? ?Results for [name], [name], [name] 07/10/2019 08:56 ? Ref. Range 11/28/2018 10:23 02/10/2019 13:25 03/19/2019 13:49 04/23/2019 13:58 06/23/2019 13:41  ?Prostatic Specific Antigen Latest Ref Range: 0.00 - 4.00 ng/mL 6.46 (H) 7.31 (H) 8.20 (H) 9.96 (H) 9.47 (H)  ? ?  ? ? ?ROS: ? ?ROS:  ?A complete review of systems was performed.  All systems are negative except for pertinent findings as noted.  ? ?ROS ? ?Allergies  ?Allergen Reactions  ? Ivp Dye [Iodinated Contrast Media] Swelling  ?  Swelling on head, took Benadryl and swelling reduced  ? Wellbutrin [Bupropion] Hives  ? ? ?Outpatient Encounter Medications as of 06/01/2021  ?Medication Sig  ? abiraterone acetate (ZYTIGA) 250 MG tablet Take on an empty stomach 1 hour before or 2 hours after a meal  ? arginine 500 MG tablet Take 500 mg by mouth 2 (two) times daily.  ? Cholecalciferol 125 MCG (5000 UT) capsule Take 5,000-10,000 Units by mouth See admin instructions. Take 10000 units in the morning and 5000 units at night  ? DANDELION ROOT PO Take 500 mg by mouth daily.  ? estradiol (VIVELLE-DOT) 0.1 MG/24HR patch Place 1 patch (0.1 mg total) onto the skin 2 (two) times a week.  ? ibuprofen (ADVIL) 200 MG tablet Take 400 mg by mouth every 6 (six) hours as needed for moderate pain.  ? Leuprolide Acetate, 6 Month, (LUPRON DEPOT, 10-MONTH,) 45 MG injection Inject 45 mg into the muscle every 6 (six) months.  ?  MAGNESIUM PO Take 1 tablet by mouth at bedtime.  ? MELATONIN PO Take 40 mg by mouth at bedtime.  ? MILK THISTLE PO Take 1 capsule by mouth daily.  ? NATTOKINASE PO Take 2 capsules by mouth daily.  ? NP THYROID 120 MG tablet Take 0.5 tablets (60 mg total) by mouth every morning.  ? olmesartan (BENICAR)  20 MG tablet TAKE 1 TABLET(20 MG) BY MOUTH DAILY  ? pantoprazole (PROTONIX) 40 MG tablet TAKE 1 TABLET BY MOUTH DAILY  ? predniSONE (DELTASONE) 5 MG tablet TAKE 1 TABLET(5 MG) BY MOUTH DAILY WITH BREAKFAST  ? QUERCETIN PO Take 1 tablet by mouth 2 (two) times daily.  ? rosuvastatin (CRESTOR) 10 MG tablet Take 1 tablet (10 mg total) by mouth daily.  ? thiamine (VITAMIN B-1) 50 MG tablet Take 1 tablet (50 mg total) by mouth daily.  ? VITAMIN K PO Take 1 capsule by mouth every other day.  ? Zinc 30 MG CAPS Take 30 mg by mouth daily.  ? [DISCONTINUED] dutasteride (AVODART) 0.5 MG capsule TAKE 1 CAPSULE(0.5 MG) BY MOUTH DAILY  ? dutasteride (AVODART) 0.5 MG capsule Take 1 capsule (0.5 mg total) by mouth daily.  ? ?Facility-Administered Encounter Medications as of 06/01/2021  ?Medication  ? heparin lock flush 100 unit/mL  ? [COMPLETED] leuprolide (6 Month) (ELIGARD) injection 45 mg  ? [DISCONTINUED] Leuprolide Acetate (6 Month) (LUPRON) injection 45 mg  ? ? ?Past Medical History:  ?Diagnosis Date  ? Bladder obstruction   ? Complication of anesthesia   ? Diverticulitis 2015  ? Dyspnea   ? Essential hypertension, benign 01/26/2019  ? at [facility] office elevated, normal at home, no medications at this time  ? History of basal cell cancer   ? HLD (hyperlipidemia) 01/26/2019  ? Hypothyroidism   ? history of, not currently taking medication  ? PONV (postoperative nausea and vomiting)   ? Prostate cancer ([place])   ? metastatic to bones  ? Vitamin D deficiency disease 01/26/2019  ? ? ?Past Surgical History:  ?Procedure Laterality Date  ? CYSTOSCOPY W/ URETERAL STENT PLACEMENT Left 11/10/2019  ? Procedure: CYSTOSCOPY;  Surgeon: Irine Seal,  MD;  Location: [facility];  Service: Urology;  Laterality: Left;  ? ESOPHAGOGASTRODUODENOSCOPY (EGD) WITH PROPOFOL N/A 07/07/2020  ? Procedure: ESOPHAGOGASTRODUODENOSCOPY (EGD) WITH PROPOFOL;  Surgeon: Rour

## 2021-06-02 NOTE — Telephone Encounter (Signed)
Oral Oncology Patient Advocate Encounter ? ?Prior Authorization for Abiraterone has been approved.   ? ?PA# [id number] ?Effective dates: 06/18/21 through 06/19/22 ? ?Oral Oncology Clinic will continue to follow.  ? ?Dennison Nancy CPHT ?Specialty Pharmacy Patient Advocate ?[place] ?Phone [phone] ?Fax [phone] ?06/02/2021 9:45 AM ? ?

## 2021-06-26 ENCOUNTER — Encounter: Payer: Self-pay | Admitting: Internal Medicine

## 2021-06-27 ENCOUNTER — Other Ambulatory Visit: Payer: Self-pay | Admitting: Internal Medicine

## 2021-07-05 ENCOUNTER — Other Ambulatory Visit (HOSPITAL_COMMUNITY): Payer: Self-pay | Admitting: Hematology

## 2021-07-05 DIAGNOSIS — C61 Malignant neoplasm of prostate: Secondary | ICD-10-CM

## 2021-07-07 ENCOUNTER — Other Ambulatory Visit (HOSPITAL_COMMUNITY): Payer: Self-pay | Admitting: Hematology

## 2021-07-07 ENCOUNTER — Other Ambulatory Visit: Payer: Self-pay | Admitting: Internal Medicine

## 2021-07-07 DIAGNOSIS — I10 Essential (primary) hypertension: Secondary | ICD-10-CM

## 2021-07-07 DIAGNOSIS — C61 Malignant neoplasm of prostate: Secondary | ICD-10-CM

## 2021-08-06 ENCOUNTER — Other Ambulatory Visit: Payer: Self-pay | Admitting: Internal Medicine

## 2021-08-06 DIAGNOSIS — E039 Hypothyroidism, unspecified: Secondary | ICD-10-CM

## 2021-08-24 ENCOUNTER — Ambulatory Visit (INDEPENDENT_AMBULATORY_CARE_PROVIDER_SITE_OTHER): Payer: BC Managed Care – PPO

## 2021-08-24 ENCOUNTER — Ambulatory Visit (INDEPENDENT_AMBULATORY_CARE_PROVIDER_SITE_OTHER): Payer: BC Managed Care – PPO | Admitting: Internal Medicine

## 2021-08-24 ENCOUNTER — Encounter: Payer: Self-pay | Admitting: Internal Medicine

## 2021-08-24 VITALS — BP 148/88 | HR 66 | Temp 98.1°F | Resp 16 | Ht 66.0 in | Wt 194.0 lb

## 2021-08-24 DIAGNOSIS — Z23 Encounter for immunization: Secondary | ICD-10-CM

## 2021-08-24 DIAGNOSIS — R2231 Localized swelling, mass and lump, right upper limb: Secondary | ICD-10-CM | POA: Diagnosis not present

## 2021-08-24 DIAGNOSIS — E785 Hyperlipidemia, unspecified: Secondary | ICD-10-CM

## 2021-08-24 DIAGNOSIS — D538 Other specified nutritional anemias: Secondary | ICD-10-CM

## 2021-08-24 DIAGNOSIS — E039 Hypothyroidism, unspecified: Secondary | ICD-10-CM

## 2021-08-24 DIAGNOSIS — E519 Thiamine deficiency, unspecified: Secondary | ICD-10-CM

## 2021-08-24 DIAGNOSIS — I10 Essential (primary) hypertension: Secondary | ICD-10-CM

## 2021-08-24 LAB — CBC WITH DIFFERENTIAL/PLATELET
Basophils Absolute: 0.1 10*3/uL (ref 0.0–0.1)
Basophils Relative: 1.9 % (ref 0.0–3.0)
Eosinophils Absolute: 0.1 10*3/uL (ref 0.0–0.7)
Eosinophils Relative: 1.8 % (ref 0.0–5.0)
HCT: 37.9 % — ABNORMAL LOW (ref 39.0–52.0)
Hemoglobin: 13.1 g/dL (ref 13.0–17.0)
Lymphocytes Relative: 21.9 % (ref 12.0–46.0)
Lymphs Abs: 1.4 10*3/uL (ref 0.7–4.0)
MCHC: 34.5 g/dL (ref 30.0–36.0)
MCV: 91.5 fl (ref 78.0–100.0)
Monocytes Absolute: 0.5 10*3/uL (ref 0.1–1.0)
Monocytes Relative: 8.3 % (ref 3.0–12.0)
Neutro Abs: 4.3 10*3/uL (ref 1.4–7.7)
Neutrophils Relative %: 66.1 % (ref 43.0–77.0)
Platelets: 213 10*3/uL (ref 150.0–400.0)
RBC: 4.14 Mil/uL — ABNORMAL LOW (ref 4.22–5.81)
RDW: 13 % (ref 11.5–15.5)
WBC: 6.5 10*3/uL (ref 4.0–10.5)

## 2021-08-24 LAB — LIPID PANEL
Cholesterol: 218 mg/dL — ABNORMAL HIGH (ref 0–200)
HDL: 41.6 mg/dL (ref >39.00)
LDL Cholesterol: 139 mg/dL — ABNORMAL HIGH (ref 0–99)
NonHDL: 176.45
Total CHOL/HDL Ratio: 5
Triglycerides: 187 mg/dL — ABNORMAL HIGH (ref 0.0–149.0)
VLDL: 37.4 mg/dL (ref 0.0–40.0)

## 2021-08-24 LAB — TSH: TSH: 4.03 u[IU]/mL (ref 0.35–5.50)

## 2021-08-24 MED ORDER — PITAVASTATIN CALCIUM 2 MG PO TABS: 2.0000 mg | ORAL_TABLET | Freq: Every day | ORAL | 1 refills | Status: DC

## 2021-08-24 MED ORDER — VITAMIN B-1 50 MG PO TABS: 50.0000 mg | ORAL_TABLET | Freq: Every day | ORAL | 1 refills | Status: DC

## 2021-08-24 NOTE — Patient Instructions (Signed)
Hypertension, Adult High blood pressure (hypertension) is when the force of blood pumping through the arteries is too strong. The arteries are the blood vessels that carry blood from the heart throughout the body. Hypertension forces the heart to work harder to pump blood and may cause arteries to become narrow or stiff. Untreated or uncontrolled hypertension can lead to a heart attack, heart failure, a stroke, kidney disease, and other problems. A blood pressure reading consists of a higher number over a lower number. Ideally, your blood pressure should be below 120/80. The first ("top") number is called the systolic pressure. It is a measure of the pressure in your arteries as your heart beats. The second ("bottom") number is called the diastolic pressure. It is a measure of the pressure in your arteries as the heart relaxes. What are the causes? The exact cause of this condition is not known. There are some conditions that result in high blood pressure. What increases the risk? Certain factors may make you more likely to develop high blood pressure. Some of these risk factors are under your control, including: Smoking. Not getting enough exercise or physical activity. Being overweight. Having too much fat, sugar, calories, or salt (sodium) in your diet. Drinking too much alcohol. Other risk factors include: Having a personal history of heart disease, diabetes, high cholesterol, or kidney disease. Stress. Having a family history of high blood pressure and high cholesterol. Having obstructive sleep apnea. Age. The risk increases with age. What are the signs or symptoms? High blood pressure may not cause symptoms. Very high blood pressure (hypertensive crisis) may cause: Headache. Fast or irregular heartbeats (palpitations). Shortness of breath. Nosebleed. Nausea and vomiting. Vision changes. Severe chest pain, dizziness, and seizures. How is this diagnosed? This condition is diagnosed by  measuring your blood pressure while you are seated, with your arm resting on a flat surface, your legs uncrossed, and your feet flat on the floor. The cuff of the blood pressure monitor will be placed directly against the skin of your upper arm at the level of your heart. Blood pressure should be measured at least twice using the same arm. Certain conditions can cause a difference in blood pressure between your right and left arms. If you have a high blood pressure reading during one visit or you have normal blood pressure with other risk factors, you may be asked to: Return on a different day to have your blood pressure checked again. Monitor your blood pressure at home for 1 week or longer. If you are diagnosed with hypertension, you may have other blood or imaging tests to help your health care provider understand your overall risk for other conditions. How is this treated? This condition is treated by making healthy lifestyle changes, such as eating healthy foods, exercising more, and reducing your alcohol intake. You may be referred for counseling on a healthy diet and physical activity. Your health care provider may prescribe medicine if lifestyle changes are not enough to get your blood pressure under control and if: Your systolic blood pressure is above 130. Your diastolic blood pressure is above 80. Your personal target blood pressure may vary depending on your medical conditions, your age, and other factors. Follow these instructions at home: Eating and drinking  Eat a diet that is high in fiber and potassium, and low in sodium, added sugar, and fat. An example of this eating plan is called the DASH diet. DASH stands for Dietary Approaches to Stop Hypertension. To eat this way: Eat   plenty of fresh fruits and vegetables. Try to fill one half of your plate at each meal with fruits and vegetables. Eat whole grains, such as whole-wheat pasta, brown rice, or whole-grain bread. Fill about one  fourth of your plate with whole grains. Eat or drink low-fat dairy products, such as skim milk or low-fat yogurt. Avoid fatty cuts of meat, processed or cured meats, and poultry with skin. Fill about one fourth of your plate with lean proteins, such as fish, chicken without skin, beans, eggs, or tofu. Avoid pre-made and processed foods. These tend to be higher in sodium, added sugar, and fat. Reduce your daily sodium intake. Many people with hypertension should eat less than 1,500 mg of sodium a day. Do not drink alcohol if: Your health care provider tells you not to drink. You are pregnant, may be pregnant, or are planning to become pregnant. If you drink alcohol: Limit how much you have to: 0-1 drink a day for women. 0-2 drinks a day for men. Know how much alcohol is in your drink. In the [place] one drink equals one 12 oz bottle of beer (355 mL), one 5 oz glass of wine (148 mL), or one 1 oz glass of hard liquor (44 mL). Lifestyle  Work with your health care provider to maintain a healthy body weight or to lose weight. Ask what an ideal weight is for you. Get at least 30 minutes of exercise that causes your heart to beat faster (aerobic exercise) most days of the week. Activities may include walking, swimming, or biking. Include exercise to strengthen your muscles (resistance exercise), such as Pilates or lifting weights, as part of your weekly exercise routine. Try to do these types of exercises for 30 minutes at least 3 days a week. Do not use any products that contain nicotine or tobacco. These products include cigarettes, chewing tobacco, and vaping devices, such as e-cigarettes. If you need help quitting, ask your health care provider. Monitor your blood pressure at home as told by your health care provider. Keep all follow-up visits. This is important. Medicines Take over-the-counter and prescription medicines only as told by your health care provider. Follow directions carefully. Blood  pressure medicines must be taken as prescribed. Do not skip doses of blood pressure medicine. Doing this puts you at risk for problems and can make the medicine less effective. Ask your health care provider about side effects or reactions to medicines that you should watch for. Contact a health care provider if you: Think you are having a reaction to a medicine you are taking. Have headaches that keep coming back (recurring). Feel dizzy. Have swelling in your ankles. Have trouble with your vision. Get help right away if you: Develop a severe headache or confusion. Have unusual weakness or numbness. Feel faint. Have severe pain in your chest or abdomen. Vomit repeatedly. Have trouble breathing. These symptoms may be an emergency. Get help right away. Call 911. Do not wait to see if the symptoms will go away. Do not drive yourself to the hospital. Summary Hypertension is when the force of blood pumping through your arteries is too strong. If this condition is not controlled, it may put you at risk for serious complications. Your personal target blood pressure may vary depending on your medical conditions, your age, and other factors. For most people, a normal blood pressure is less than 120/80. Hypertension is treated with lifestyle changes, medicines, or a combination of both. Lifestyle changes include losing weight, eating a healthy,   low-sodium diet, exercising more, and limiting alcohol. This information is not intended to replace advice given to you by your health care provider. Make sure you discuss any questions you have with your health care provider. Document Revised: 01/10/2021 Document Reviewed: 01/10/2021 Elsevier Patient Education  2023 [employer]  

## 2021-08-24 NOTE — Progress Notes (Signed)
Subjective:  Patient ID: Scott [name], male    DOB: 12/16/1955  Age: 66 y.o. MRN: [id number]  CC: Hypertension and Hypothyroidism   HPI [name] presents for f/up -  He has had dyspnea on exertion for several years.  It has not recently changed.  He denies chest pain, diaphoresis, edema, or HA  He complains of a 57-month history of an enlarging mass on the dorsum of his right forearm.  He says he had a dog bite at this site about a year and a half ago.  The growth does not bother him very much.  He is not taking rosuvastatin because it caused musculoskeletal pain.  He would like to try a different statin.  Outpatient Medications Prior to Visit  Medication Sig Dispense Refill   abiraterone acetate (ZYTIGA) 250 MG tablet TAKE 4 TABLETS DAILY ON AN EMPTY STOMACH, 1 HOUR BEFORE OR 2 HOURS AFTER A MEAL. 120 tablet 1   arginine 500 MG tablet Take 500 mg by mouth 2 (two) times daily.     Cholecalciferol 125 MCG (5000 UT) capsule Take 5,000-10,000 Units by mouth See admin instructions. Take 38756 units in the morning and 5000 units at night     DANDELION ROOT PO Take 500 mg by mouth daily.     dutasteride (AVODART) 0.5 MG capsule Take 1 capsule (0.5 mg total) by mouth daily. 90 capsule 3   estradiol (VIVELLE-DOT) 0.1 MG/24HR patch Place 1 patch (0.1 mg total) onto the skin 2 (two) times a week. 8 patch 12   ibuprofen (ADVIL) 200 MG tablet Take 400 mg by mouth every 6 (six) hours as needed for moderate pain.     Leuprolide Acetate, 6 Month, (LUPRON DEPOT, 21-MONTH,) 45 MG injection Inject 45 mg into the muscle every 6 (six) months.     MAGNESIUM PO Take 1 tablet by mouth at bedtime.     MELATONIN PO Take 40 mg by mouth at bedtime.     MILK THISTLE PO Take 1 capsule by mouth daily.     NATTOKINASE PO Take 2 capsules by mouth daily.     NP THYROID 120 MG tablet TAKE 1/2 TABLET(60 MG) BY MOUTH EVERY MORNING 45 tablet 0   olmesartan (BENICAR) 20 MG tablet TAKE 1 TABLET(20 MG) BY MOUTH DAILY 90  tablet 0   pantoprazole (PROTONIX) 40 MG tablet TAKE 1 TABLET BY MOUTH DAILY 30 tablet 5   predniSONE (DELTASONE) 5 MG tablet TAKE 1 TABLET(5 MG) BY MOUTH DAILY WITH BREAKFAST 30 tablet 6   QUERCETIN PO Take 1 tablet by mouth 2 (two) times daily.     VITAMIN K PO Take 1 capsule by mouth every other day.     Zinc 30 MG CAPS Take 30 mg by mouth daily.     thiamine (VITAMIN B-1) 50 MG tablet Take 1 tablet (50 mg total) by mouth daily. 90 tablet 1   rosuvastatin (CRESTOR) 10 MG tablet Take 1 tablet (10 mg total) by mouth daily. 90 tablet 1   heparin lock flush 100 unit/mL      No facility-administered medications prior to visit.    ROS Review of Systems  Constitutional:  Positive for unexpected weight change (wt gain). Negative for chills, diaphoresis and fatigue.  HENT: Negative.    Eyes: Negative.   Respiratory:  Negative for cough, chest tightness, shortness of breath and wheezing.   Cardiovascular:  Negative for chest pain, palpitations and leg swelling.  Gastrointestinal:  Negative for abdominal pain, constipation,  diarrhea, nausea and vomiting.  Endocrine: Negative.   Genitourinary: Negative.  Negative for difficulty urinating.  Musculoskeletal:  Positive for arthralgias. Negative for myalgias.  Skin: Negative.   Neurological: Negative.  Negative for dizziness, weakness and light-headedness.  Hematological:  Negative for adenopathy. Does not bruise/bleed easily.  Psychiatric/Behavioral: Negative.      Objective:  BP (!) 148/88 (BP Location: Right Arm, Patient Position: Sitting, Cuff Size: Large)   Pulse 66   Temp 98.1 F (36.7 C) (Oral)   Resp 16   Ht 5\' 6"  (1.676 m)   Wt 194 lb (88 kg)   SpO2 99%   BMI 31.31 kg/m   BP Readings from Last 3 Encounters:  08/24/21 (!) 148/88  06/01/21 (!) 169/88  05/23/21 (!) 141/84    Wt Readings from Last 3 Encounters:  08/24/21 194 lb (88 kg)  06/01/21 188 lb (85.3 kg)  05/23/21 189 lb 3.2 oz (85.8 kg)    Physical Exam Vitals  reviewed.  HENT:     Nose: Nose normal.     Mouth/Throat:     Mouth: Mucous membranes are moist.  Eyes:     General: No scleral icterus.    Conjunctiva/sclera: Conjunctivae normal.  Cardiovascular:     Rate and Rhythm: Normal rate and regular rhythm.     Heart sounds: No murmur heard. Pulmonary:     Effort: Pulmonary effort is normal.     Breath sounds: No stridor. No wheezing, rhonchi or rales.  Abdominal:     General: Abdomen is flat.     Palpations: There is no mass.     Tenderness: There is no abdominal tenderness. There is no guarding.     Hernia: No hernia is present.  Musculoskeletal:        General: Normal range of motion.     Cervical back: Neck supple.     Right lower leg: No edema.     Left lower leg: No edema.     Comments: Dorsum of right forearm there is a raised, firm, discrete, nontender, nonfluctuant, 3 cm subcutaneous mass.  See photo.  Lymphadenopathy:     Cervical: No cervical adenopathy.  Skin:    General: Skin is warm and dry.  Neurological:     General: No focal deficit present.     Mental Status: He is alert.  Psychiatric:        Mood and Affect: Mood normal.        Behavior: Behavior normal.     Lab Results  Component Value Date   WBC 6.5 08/24/2021   HGB 13.1 08/24/2021   HCT 37.9 (L) 08/24/2021   PLT 213.0 08/24/2021   GLUCOSE 114 (H) 05/16/2021   CHOL 218 (H) 08/24/2021   TRIG 187.0 (H) 08/24/2021   HDL 41.60 08/24/2021   LDLCALC 139 (H) 08/24/2021   ALT 10 05/16/2021   AST 14 (L) 05/16/2021   NA 132 (L) 05/16/2021   K 3.7 05/16/2021   CL 104 05/16/2021   CREATININE 0.77 05/16/2021   BUN 15 05/16/2021   CO2 19 (L) 05/16/2021   TSH 4.03 08/24/2021   PSA 1.12 02/08/2021   HGBA1C 5.5 01/27/2020    DG Bone Density  Result Date: 02/24/2021 EXAM: DUAL X-RAY ABSORPTIOMETRY (DXA) FOR BONE MINERAL DENSITY IMPRESSION: Your patient Scott [name] completed a BMD test on 02/24/2021 using the [employer] DXA System (software version:  14.10) manufactured by [employer]. The following summarizes the results of our evaluation. Technologist: [facility] PATIENT BIOGRAPHICAL:  Name: Scott [name] Patient ID: [id number] Birth Date: 1955/05/16 Height: 66.0 in. Gender: Male Exam Date: 02/24/2021 Weight: 192.0 lbs. Indications: Caucasian, Chronic Glucocorticoids, Height Loss, Prostate Cancer, Vitamin D Deficiency Fractures: Treatments: Vitamin D DENSITOMETRY RESULTS: Site      Region     Measured Date Measured Age WHO Classification Young Adult T-score BMD         %Change vs. Previous Significant Change (*) AP Spine L1-L4 (L2) 4/24/2023 63.8 N/A 0.2 1.237 g/cm2 - - DualFemur Neck Right 02/24/2021 63.8 N/A -2.5 0.751 g/cm2 - - DualFemur Total Mean 02/24/2021 63.8 N/A -1.3 0.910 g/cm2 - - ASSESSMENT: BMD as determined from Femur Neck Right is 0.751 g/cm2 with a T-score of -2.5. This patient is considered osteoporotic by [employer] [facility]) Criteria. The scan quality is good. L2 was excluded due to advanced degenerative changes. World Science writer [facility]) criteria for post-menopausal, Caucasian Women: Normal:       T-score at or above -1 SD Osteopenia:   T-score between -1 and -2.5 SD Osteoporosis: T-score at or below -2.5 SD RECOMMENDATIONS: 1. All patients should optimize calcium and vitamin D intake. 2. Consider FDA-approved medical therapies in postmenopausal women and med aged 42 years and older, based on the following: a. A hip or vertebral (clinical or morphometric) fracture b. T-score< -2.5 at the femoral neck or spine after appropriate evaluation to exclude secondary causes c. Low bone mass (T-score between -1.0 and -2.5 at the femoral neck or spine) and a 10-year probability of a hip fracture > 3% or a 10-year probability of a major osteoporosis-related fracture > 20% based on the [place]-adapted WHO algorithm d. Clinician judgment and/or patient preferences may indicate treatment for people with 10-year fracture probabilities above  or below these levels FOLLOW-UP: People with diagnosed cases of osteoporosis or osteopenia should be regularly tested for bone mineral density. For patients eligible for Medicare, routine testing is allowed once every 2 years. Testing frequency can be increased for patients who have rapidly progressing disease, or for those who are receiving medical therapy to restore bone mass. I have reviewed this report, and agree with the above findings. [facility] Electronically Signed   By: Charlett Nose M.D.   On: 02/24/2021 15:07    Assessment & Plan:   Steban was seen today for hypertension and hypothyroidism.  Diagnoses and all orders for this visit:  Hypertension, unspecified type- His blood pressure is adequately well controlled.  Anemia due to acquired thiamine deficiency- His H&H have improved. -     CBC with Differential/Platelet; Future -     CBC with Differential/Platelet -     thiamine (VITAMIN B-1) 50 MG tablet; Take 1 tablet (50 mg total) by mouth daily.  Acquired hypothyroidism- He is euthyroid. -     TSH; Future -     TSH  Mass of skin of right forearm -     DG Forearm Right; Future -     Ambulatory referral to Orthopedic Surgery  Hyperlipidemia with target LDL less than 100- Will try a different statin. -     Lipid panel; Future -     Lipid panel -     Pitavastatin Calcium 2 MG TABS; Take 1 tablet (2 mg total) by mouth daily.  Other orders -     Varicella-zoster vaccine IM (Shingrix)   I have discontinued Greer Ee "Will"'s rosuvastatin. I am also having him start on Pitavastatin Calcium. Additionally, I am having him maintain his Cholecalciferol, Lupron Depot (44-Month),  Zinc, VITAMIN K PO, QUERCETIN PO, MAGNESIUM PO, MILK THISTLE PO, NATTOKINASE PO, ibuprofen, MELATONIN PO, DANDELION ROOT PO, arginine, estradiol, predniSONE, pantoprazole, dutasteride, abiraterone acetate, olmesartan, NP Thyroid, and thiamine. We will stop administering heparin lock  flush.  Meds ordered this encounter  Medications   Pitavastatin Calcium 2 MG TABS    Sig: Take 1 tablet (2 mg total) by mouth daily.    Dispense:  90 tablet    Refill:  1   thiamine (VITAMIN B-1) 50 MG tablet    Sig: Take 1 tablet (50 mg total) by mouth daily.    Dispense:  90 tablet    Refill:  1     Follow-up: Return in about 6 months (around 02/23/2022).  Sanda Linger, MD

## 2021-08-25 ENCOUNTER — Telehealth: Payer: Self-pay | Admitting: *Deleted

## 2021-08-25 NOTE — Telephone Encounter (Signed)
Rec'd fax pt need PA on Livalo 2 mg. Submitted w/ (Key: [id number]). Rec'd msg " Your information has been submitted. [employer] is reviewing the PA request and you will receive an electronic response."

## 2021-08-26 ENCOUNTER — Other Ambulatory Visit: Payer: Self-pay | Admitting: Nurse Practitioner

## 2021-08-28 IMAGING — CT CT ABD-PELV W/ CM
2 of 5 series · 15 of 46 positions shown, 17 images · IV contrast (APPLIED)
Comparison: Multiple exams, including PET-CT from 12/11/2018 bone
scan from 10/27/2019

CLINICAL DATA: Prostate cancer restaging, ongoing IV Lupron. Rising
PSA level.

EXAM:
CT ABDOMEN AND PELVIS WITH CONTRAST
TECHNIQUE: Multidetector CT imaging of the abdomen and pelvis was performed
using the standard protocol following bolus administration of
intravenous contrast.
CONTRAST:  100mL OMNIPAQUE IOHEXOL 300 MG/ML  SOLN

[Series 2: axial st · axial · 0.82mm/px · z∈[-892,-467]mm · 12 of 99 slices shown, 14 images]
[im 7/99  soft-tissue]
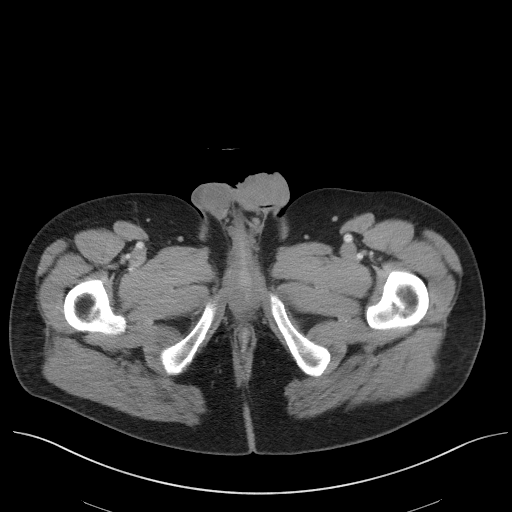
[im 7/99  bone]
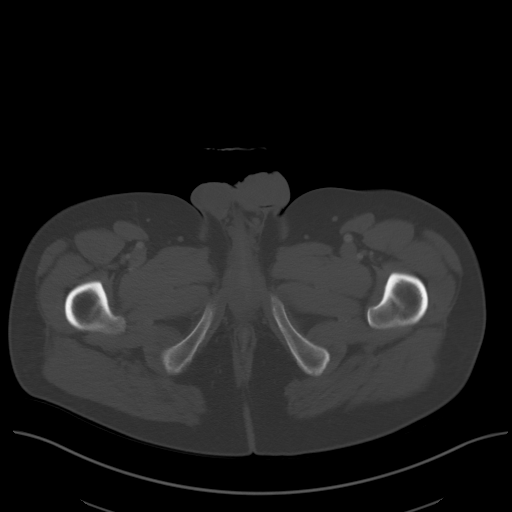
[im 14/99  soft-tissue]
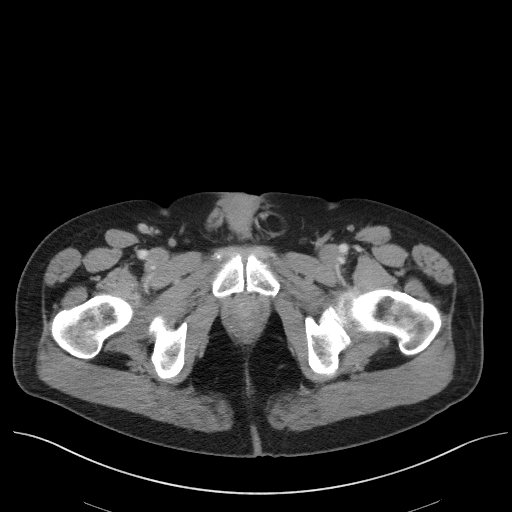
[im 20/99  soft-tissue]
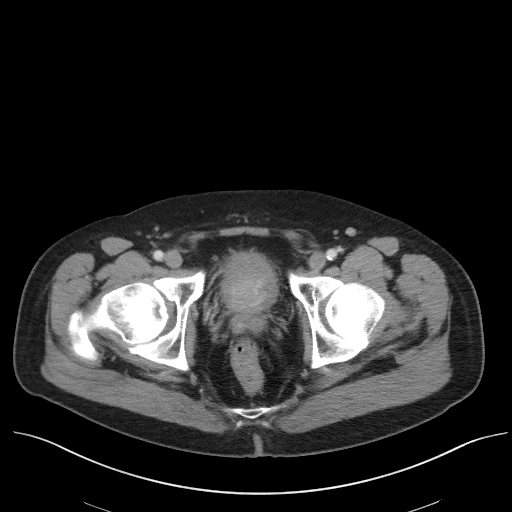
[im 33/99  soft-tissue]
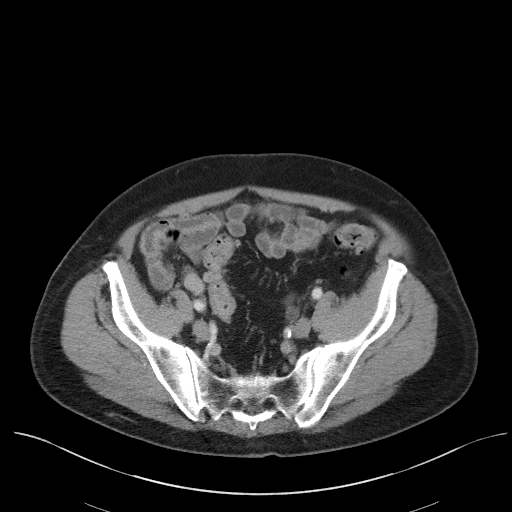
[im 40/99  soft-tissue]
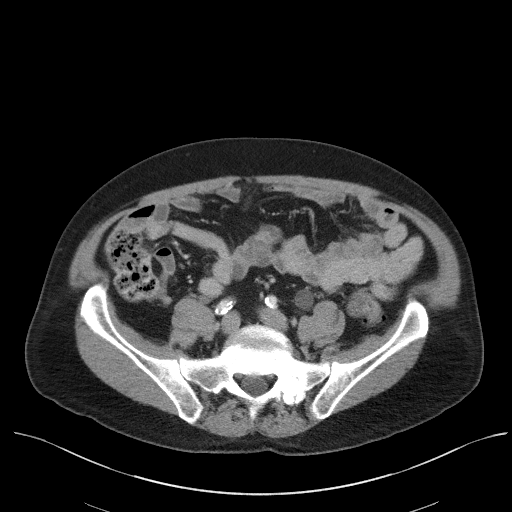
[im 46/99  soft-tissue]
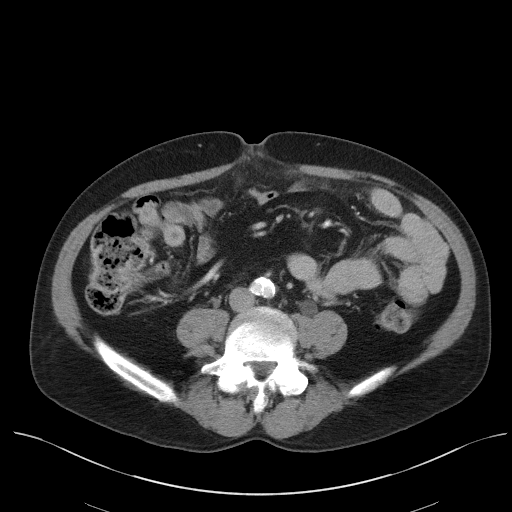
[im 53/99  soft-tissue]
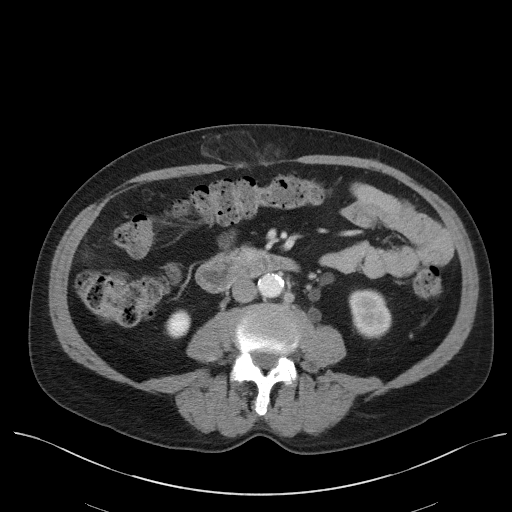
[im 59/99  soft-tissue]
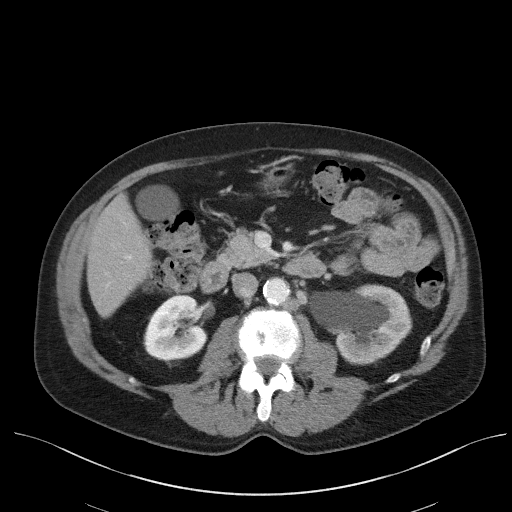
[im 66/99  soft-tissue]
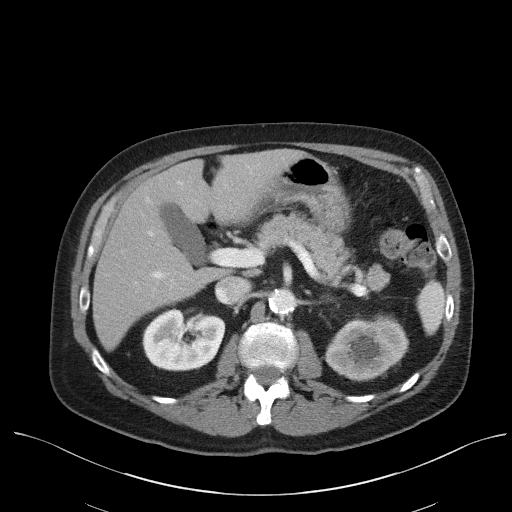
[im 66/99  bone]
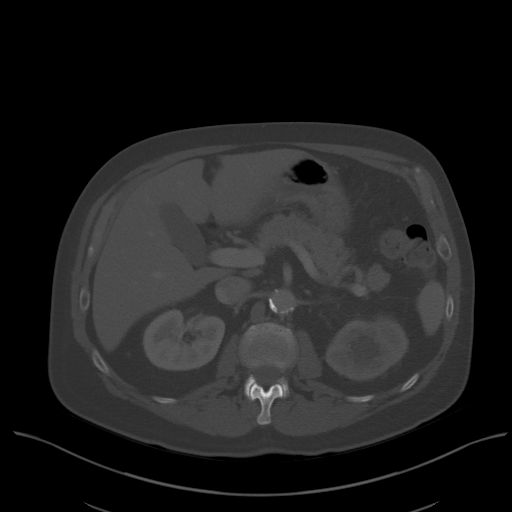
[im 79/99  soft-tissue]
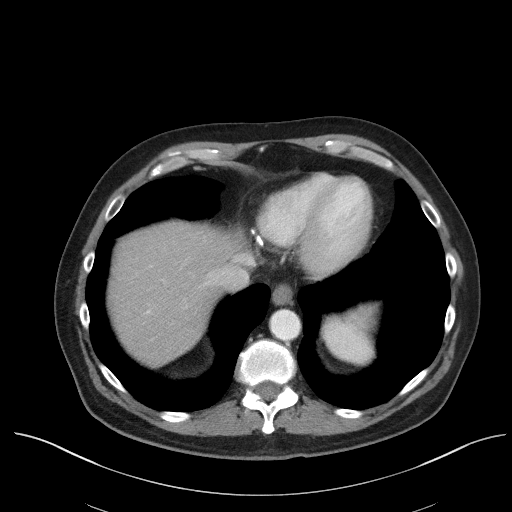
[im 85/99  soft-tissue]
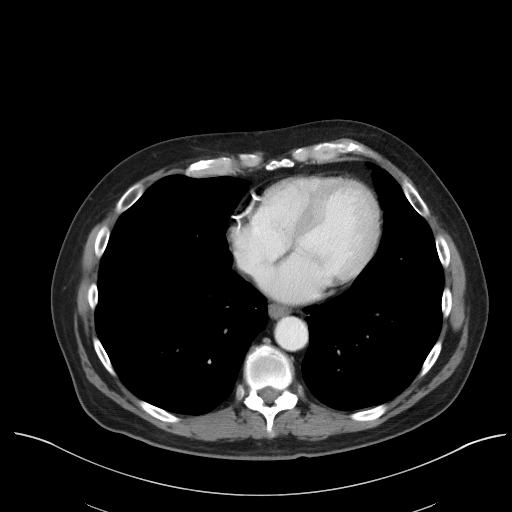
[im 92/99  soft-tissue]
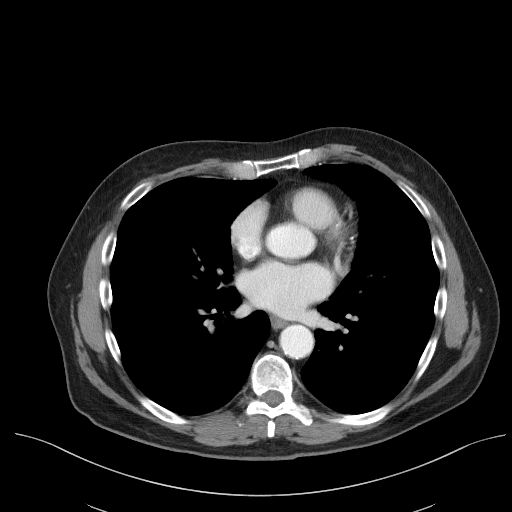

[Series 4: coronal st · coronal · 0.76mm/px · 3 of 101 slices shown]
[im 34/101  soft-tissue]
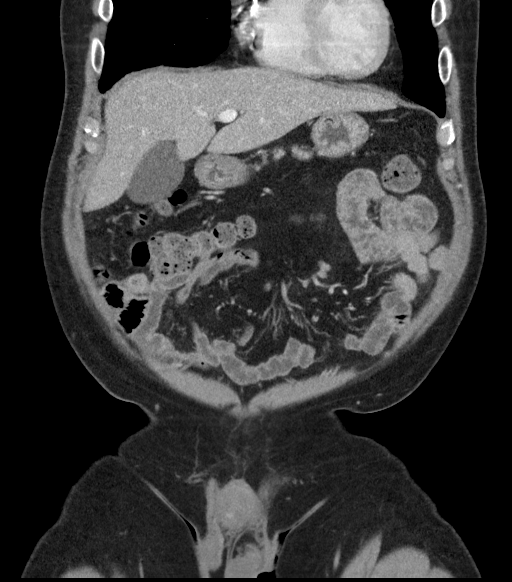
[im 45/101  soft-tissue]
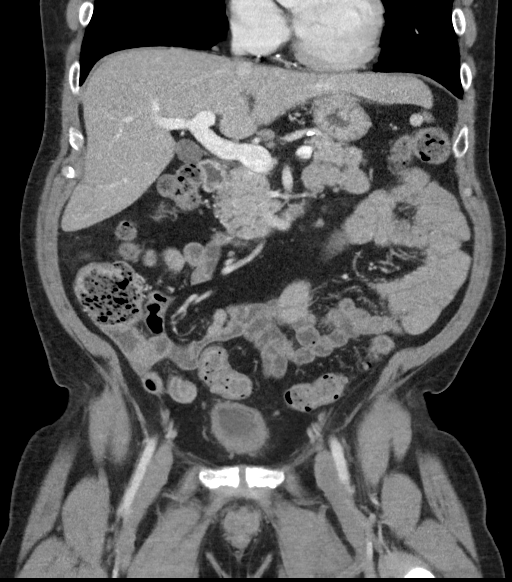
[im 56/101  soft-tissue]
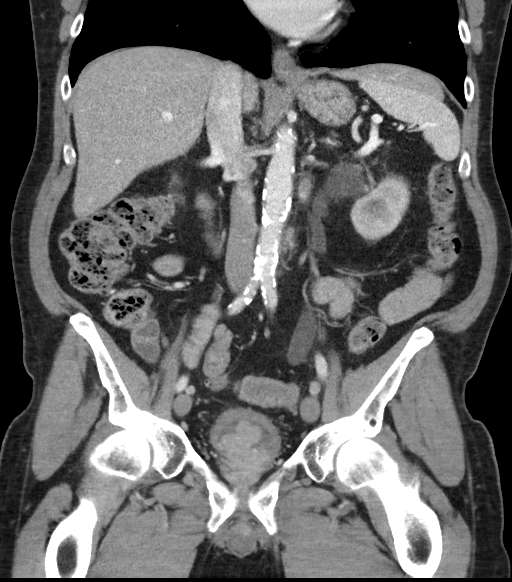

[15 of 46 positions shown; findings below may reference images not displayed]

FINDINGS: Lower chest: Gynecomastia. Left anterior descending and right
coronary artery atherosclerosis along with descending thoracic
aortic atherosclerosis.

Hepatobiliary: Unremarkable.  The gallbladder appears normal.

Pancreas: Unremarkable

Spleen: Unremarkable

Adrenals/Urinary Tract: Masslike soft tissue density posteriorly in
the urinary bladder with possibilities including unusual lobular
extension of prostate gland versus transitional cell
carcinoma/bladder tumor as shown on images 76-80 of series 2. There
is prominent left hydronephrosis and hydroureter extending down to
the left UVJ, blockage is likely caused by this tumor. Delayed
excretion on the left. There borderline right hydroureter as well
but no right hydronephrosis.

Stomach/Bowel: Mild sigmoid diverticulosis.

Vascular/Lymphatic: Aortoiliac atherosclerotic vascular disease.

Mild left periaortic adenopathy including a 1.0 cm in short axis
node on image 38/2 (previously 0.7 cm) and a 1.0 cm left periaortic
node on image 48/2 (previously 0.7 cm). Peripancreatic node 1.0 cm
in short axis on image 32/2, previously the same. Left external
iliac node 0.8 cm in short axis, image 70/2, previously 0.7 cm.

Reproductive: Heterogeneous enhancement in the prostate gland. I am
uncertain whether the nodular soft tissue density extending along
the bladder base is due to invading prostate tumor or transitional
cell carcinoma.

Other: No supplemental non-categorized findings.

Musculoskeletal: Supraumbilical bilobed hernia containing adipose
tissue with mild stranding and mild stranding in the adjacent
omentum near the hernia. Fatty left spermatic cord possibly from
small indirect inguinal hernia containing adipose tissue or lipoma
along the spermatic cord.

Compared to CT of 12/11/2018, there is a new sclerotic lesion in the
left anterior vertebral body measuring 1.3 by 1.0 cm (image 36/2)
which corresponds with the intense activity on the bone scan,
favoring a small sclerotic osseous metastatic lesion.

Stable 5 mm sclerotic lesion in the left iliac crest, nonspecific.

Schmorl's node along the superior endplate of L3. Grade 1
degenerative anterolisthesis at L4-5 with facet arthropathy at L4-5
and L5-S1. The lumbar spondylosis and degenerative disc disease
contributes to bilateral foraminal impingement at L4-5 and left
foraminal impingement at L5-S1.
IMPRESSION: 1. Masslike soft tissue density posteriorly in the urinary bladder
with possibilities including unusual lobular extension of prostate
tumor versus new transitional cell carcinoma/bladder tumor. There is
prominent left hydronephrosis and hydroureter extending down to the
left UVJ, blockage is likely caused by this tumor.
2. Mild left periaortic adenopathy, increased from prior, suspicious
for malignancy.
3. New sclerotic lesion in the left anterior vertebral body at L1,
favoring a small sclerotic osseous metastatic lesion.
4. Other imaging findings of potential clinical significance:
Coronary atherosclerosis. Gynecomastia. Mild sigmoid diverticulosis.
Supraumbilical bilobed hernia containing adipose tissue with mild
stranding and mild stranding in the adjacent omentum near the
hernia. Fatty left spermatic cord possibly from small indirect
inguinal hernia containing adipose tissue or lipoma along the
spermatic cord. Lumbar spondylosis and degenerative disc disease
causing bilateral foraminal impingement at L4-5 and left foraminal
impingement at L5-S1.
5. Aortic atherosclerosis.

Aortic Atherosclerosis ([email or web address]).

## 2021-08-28 NOTE — Telephone Encounter (Signed)
Checking status on PA rec'd msg stating " Your information has been submitted. [employer] is reviewing the PA request and you will receive an electronic response. " Will check later.Marland KitchenJohny Chess

## 2021-08-29 ENCOUNTER — Other Ambulatory Visit (HOSPITAL_COMMUNITY): Payer: Self-pay | Admitting: Hematology

## 2021-08-29 DIAGNOSIS — C61 Malignant neoplasm of prostate: Secondary | ICD-10-CM

## 2021-08-30 ENCOUNTER — Inpatient Hospital Stay (HOSPITAL_COMMUNITY): Payer: BC Managed Care – PPO | Attending: Hematology

## 2021-08-30 DIAGNOSIS — Z7952 Long term (current) use of systemic steroids: Secondary | ICD-10-CM | POA: Diagnosis not present

## 2021-08-30 DIAGNOSIS — I10 Essential (primary) hypertension: Secondary | ICD-10-CM | POA: Diagnosis not present

## 2021-08-30 DIAGNOSIS — R972 Elevated prostate specific antigen [PSA]: Secondary | ICD-10-CM | POA: Insufficient documentation

## 2021-08-30 DIAGNOSIS — Z803 Family history of malignant neoplasm of breast: Secondary | ICD-10-CM | POA: Insufficient documentation

## 2021-08-30 DIAGNOSIS — C779 Secondary and unspecified malignant neoplasm of lymph node, unspecified: Secondary | ICD-10-CM | POA: Insufficient documentation

## 2021-08-30 DIAGNOSIS — Z800 Family history of malignant neoplasm of digestive organs: Secondary | ICD-10-CM | POA: Diagnosis not present

## 2021-08-30 DIAGNOSIS — Z8042 Family history of malignant neoplasm of prostate: Secondary | ICD-10-CM | POA: Diagnosis not present

## 2021-08-30 DIAGNOSIS — N1330 Unspecified hydronephrosis: Secondary | ICD-10-CM | POA: Diagnosis not present

## 2021-08-30 DIAGNOSIS — M545 Low back pain, unspecified: Secondary | ICD-10-CM | POA: Diagnosis not present

## 2021-08-30 DIAGNOSIS — M25551 Pain in right hip: Secondary | ICD-10-CM | POA: Insufficient documentation

## 2021-08-30 DIAGNOSIS — E039 Hypothyroidism, unspecified: Secondary | ICD-10-CM | POA: Diagnosis not present

## 2021-08-30 DIAGNOSIS — M810 Age-related osteoporosis without current pathological fracture: Secondary | ICD-10-CM | POA: Insufficient documentation

## 2021-08-30 DIAGNOSIS — G629 Polyneuropathy, unspecified: Secondary | ICD-10-CM | POA: Diagnosis not present

## 2021-08-30 DIAGNOSIS — C7951 Secondary malignant neoplasm of bone: Secondary | ICD-10-CM | POA: Diagnosis not present

## 2021-08-30 DIAGNOSIS — Z87891 Personal history of nicotine dependence: Secondary | ICD-10-CM | POA: Diagnosis not present

## 2021-08-30 DIAGNOSIS — M25552 Pain in left hip: Secondary | ICD-10-CM | POA: Insufficient documentation

## 2021-08-30 DIAGNOSIS — C61 Malignant neoplasm of prostate: Secondary | ICD-10-CM | POA: Diagnosis present

## 2021-08-30 LAB — CBC
HCT: 35.6 % — ABNORMAL LOW (ref 39.0–52.0)
Hemoglobin: 12.4 g/dL — ABNORMAL LOW (ref 13.0–17.0)
MCH: 31.7 pg (ref 26.0–34.0)
MCHC: 34.8 g/dL (ref 30.0–36.0)
MCV: 91 fL (ref 80.0–100.0)
Platelets: 210 10*3/uL (ref 150–400)
RBC: 3.91 MIL/uL — ABNORMAL LOW (ref 4.22–5.81)
RDW: 12.3 % (ref 11.5–15.5)
WBC: 6.5 10*3/uL (ref 4.0–10.5)
nRBC: 0 % (ref 0.0–0.2)

## 2021-08-30 LAB — COMPREHENSIVE METABOLIC PANEL
ALT: 12 U/L (ref 0–44)
AST: 14 U/L — ABNORMAL LOW (ref 15–41)
Albumin: 3.8 g/dL (ref 3.5–5.0)
Alkaline Phosphatase: 64 U/L (ref 38–126)
Anion gap: 6 (ref 5–15)
BUN: 18 mg/dL (ref 8–23)
CO2: 24 mmol/L (ref 22–32)
Calcium: 9.1 mg/dL (ref 8.9–10.3)
Chloride: 105 mmol/L (ref 98–111)
Creatinine, Ser: 0.97 mg/dL (ref 0.61–1.24)
GFR, Estimated: >60 mL/min (ref >60)
Glucose, Bld: 102 mg/dL — ABNORMAL HIGH (ref 70–99)
Potassium: 4 mmol/L (ref 3.5–5.1)
Sodium: 135 mmol/L (ref 135–145)
Total Bilirubin: 1 mg/dL (ref 0.3–1.2)
Total Protein: 6.6 g/dL (ref 6.5–8.1)

## 2021-08-30 LAB — VITAMIN D 25 HYDROXY (VIT D DEFICIENCY, FRACTURES): Vit D, 25-Hydroxy: 67.31 ng/mL (ref 30–100)

## 2021-08-30 LAB — PSA: Prostatic Specific Antigen: 3.58 ng/mL (ref 0.00–4.00)

## 2021-09-05 NOTE — Telephone Encounter (Signed)
Check status on PA still determining w/ insurance../l,mb

## 2021-09-06 ENCOUNTER — Encounter (HOSPITAL_COMMUNITY): Payer: Self-pay | Admitting: Hematology

## 2021-09-06 ENCOUNTER — Other Ambulatory Visit (HOSPITAL_COMMUNITY): Payer: Self-pay | Admitting: *Deleted

## 2021-09-06 ENCOUNTER — Inpatient Hospital Stay (HOSPITAL_BASED_OUTPATIENT_CLINIC_OR_DEPARTMENT_OTHER): Payer: BC Managed Care – PPO | Admitting: Hematology

## 2021-09-06 VITALS — BP 155/89 | HR 55 | Temp 98.9°F | Resp 18 | Wt 193.8 lb

## 2021-09-06 DIAGNOSIS — C61 Malignant neoplasm of prostate: Secondary | ICD-10-CM | POA: Diagnosis not present

## 2021-09-06 MED ORDER — TRAMADOL HCL 50 MG PO TABS: 50.0000 mg | ORAL_TABLET | Freq: Two times a day (BID) | ORAL | 0 refills | Status: DC

## 2021-09-06 MED ORDER — TRAMADOL HCL 50 MG PO TABS: 50.0000 mg | ORAL_TABLET | Freq: Every day | ORAL | 0 refills | Status: DC

## 2021-09-06 MED ORDER — TRAMADOL HCL 50 MG PO TABS: 50.0000 mg | ORAL_TABLET | Freq: Every evening | ORAL | 0 refills | Status: DC | PRN

## 2021-09-06 NOTE — Patient Instructions (Addendum)
[place] at [facility] Discharge Instructions  You were seen by Dr. Delton Coombes today. He talked with you about how you are feeling.  He thinks based on your previous pet scan and bone scan last year, your pain in hip and back are coming from possible arthritis.  Your lab work shows that your PSA has continued to rise and it is time for another scan to assess what your cancer is doing.  We will schedule you for a PSMA PET scan which is specific for prostate cancer.  Based on the results of this PET scan will determine which medication we can use moving forward.    Continue taking your calcium.  We will taper you off your prednisone.  He wants you to take 5 mg every other day for four weeks and then stop.  You can stop the Zytiga immediately.   We will see you back after the scan.     Thank you for choosing [place] at [facility] to provide your oncology and hematology care.  To afford each patient quality time with our provider, please arrive at least 15 minutes before your scheduled appointment time.   If you have a lab appointment with the [facility] please come in thru the Main Entrance and check in at the main information desk.  You need to re-schedule your appointment should you arrive 10 or more minutes late.  We strive to give you quality time with our providers, and arriving late affects you and other patients whose appointments are after yours.  Also, if you no show three or more times for appointments you may be dismissed from the clinic at the providers discretion.     Again, thank you for choosing [facility].  Our hope is that these requests will decrease the amount of time that you wait before being seen by our physicians.       _____________________________________________________________  Should you have questions after your visit to [facility], please contact our office at [phone] and follow the  prompts.  Our office hours are 8:00 a.m. and 4:30 p.m. Monday - Friday.  Please note that voicemails left after 4:00 p.m. may not be returned until the following business day.  We are closed weekends and major holidays.  You do have access to a nurse 24-7, just call the main number to the clinic [phone] and do not press any options, hold on the line and a nurse will answer the phone.    For prescription refill requests, have your pharmacy contact our office and allow 72 hours.    Due to Covid, you will need to wear a mask upon entering the hospital. If you do not have a mask, a mask will be given to you at the Main Entrance upon arrival. For doctor visits, patients may have 1 support person age 64 or older with them. For treatment visits, patients can not have anyone with them due to social distancing guidelines and our immunocompromised population.

## 2021-09-06 NOTE — Progress Notes (Signed)
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 TURP on 11/10/2019. 2. Docetaxel x 6 cycles from 11/16/2019 to 02/29/2020.  NGS Results: not done  CURRENT THERAPY: Zytiga 1,000 mg daily  BRIEF ONCOLOGIC HISTORY:  Oncology History  Prostate cancer metastatic to multiple sites [facility])  01/25/2017 Initial Diagnosis   Prostate cancer metastatic to multiple sites [facility])   11/16/2019 - 03/02/2020 Chemotherapy   Patient is on Treatment Plan : PROSTATE Docetaxel + Prednisone q21d      Genetic Testing   Negative genetic testing. No pathogenic variants identified on the [facility] CancerNext-Expanded panel. 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 The report date is 04/19/2020.  The CancerNext-Expanded  gene panel offered by [facility] and includes sequencing and rearrangement analysis for the following 77 genes: IP, ALK, APC*, ATM*, AXIN2, BAP1, BARD1, BLM, BMPR1A, BRCA1*, BRCA2*, BRIP1*, CDC73, CDH1*,CDK4, CDKN1B, CDKN2A, CHEK2*, CTNNA1, DICER1, FANCC, FH, FLCN, GALNT12, KIF1B, LZTR1, MAX, MEN1, MET, MLH1*, MSH2*, MSH3, MSH6*, MUTYH*, NBN, NF1*, NF2, NTHL1, PALB2*, PHOX2B, PMS2*, POT1, PRKAR1A, PTCH1, PTEN*, RAD51C*, RAD51D*,RB1, RECQL, RET, SDHA, SDHAF2, SDHB, SDHC, SDHD, SMAD4, SMARCA4, SMARCB1, SMARCE1, STK11, SUFU, TMEM127, TP53*,TSC1, TSC2, VHL and XRCC2 (sequencing and deletion/duplication); EGFR, EGLN1, HOXB13, KIT, MITF, PDGFRA, POLD1 and POLE (sequencing only); EPCAM and GREM1 (deletion/duplication only).      CANCER STAGING: Cancer Staging  No matching staging information was found for the patient.  INTERVAL HISTORY:  [name], a 66 y.o. male, returns for routine follow-up of his metastatic castration resistant prostate  cancer to bones. [name] was last seen on 05/23/2021.   Today he reports feeling well. He reports sharp posterior hip pain that radiates up his spine and occasionally disrupts his sleep, and he also reports generalized aching. He is taking ibuprofen and tramadol prn for this pain. His left foot tingling is stable. He reports worsening fatigue. He reports a lump on his right forearm at the site of a dog bite he received in November 2021; the lump is not painful until bumped.   REVIEW OF SYSTEMS:  Review of Systems  Constitutional:  Positive for fatigue. Negative for appetite change.  Respiratory:  Positive for shortness of breath.   Musculoskeletal:  Positive for arthralgias (hips).  Neurological:  Positive for numbness (L foot).  Hematological:  Bruises/bleeds easily.  All other systems reviewed and are negative.   PAST MEDICAL/SURGICAL HISTORY:  Past Medical History:  Diagnosis Date   Bladder obstruction    Complication of anesthesia    Diverticulitis 2015   Dyspnea    Essential hypertension, benign 01/26/2019   at [place] office elevated, normal at home, no medications at this time   History of basal cell cancer    HLD (hyperlipidemia) 01/26/2019   Hypothyroidism    history of, not currently taking medication   PONV (postoperative nausea and vomiting)    Prostate cancer ([place])    metastatic to bones   Vitamin D deficiency disease 01/26/2019   Past Surgical History:  Procedure Laterality Date   CYSTOSCOPY W/ URETERAL STENT PLACEMENT Left 11/10/2019   Procedure: CYSTOSCOPY;  Surgeon: Irine Seal, MD;  [facility];  Service: Urology;  Laterality: Left;   ESOPHAGOGASTRODUODENOSCOPY (EGD) WITH PROPOFOL N/A 07/07/2020   Procedure: ESOPHAGOGASTRODUODENOSCOPY (EGD) WITH PROPOFOL;  Surgeon:  Rourk, Cristopher Estimable, MD;  [facility];  Service: Endoscopy;  Laterality: N/A;  pm appt   HERNIA REPAIR  [id number]   UMBILICAL    MALONEY DILATION N/A 07/07/2020   Procedure: MALONEY DILATION;   Surgeon: Daneil Dolin, MD;  [facility];  Service: Endoscopy;  Laterality: N/A;   PROSTATE SURGERY     SKIN CANCER EXCISION     TONSILLECTOMY Bilateral    TOOTH EXTRACTION     front left tooth pulled but no surgery or stitches   TRANSURETHRAL RESECTION OF PROSTATE N/A 01/25/2017   Procedure: TRANSURETHRAL RESECTION OF THE PROSTATE (TURP);  Surgeon: Alexis Frock, MD;  [facility];  Service: Urology;  Laterality: N/A;   TRANSURETHRAL RESECTION OF PROSTATE N/A 11/10/2019   Procedure: TRANSURETHRAL RESECTION OF THE PROSTATE (TURP);  Surgeon: Irine Seal, MD;  [facility];  Service: Urology;  Laterality: N/A;   WISDOM TOOTH EXTRACTION      SOCIAL HISTORY:  Social History   Socioeconomic History   Marital status: Married    Spouse name: Not on file   Number of children: 1   Years of education: Not on file   Highest education level: Not on file  Occupational History    Comment: working full time  Tobacco Use   Smoking status: Former    Packs/day: 1.50    Years: 35.00    Total pack years: 52.50    Types: Cigarettes    Quit date: 01/12/2004    Years since quitting: 17.6   Smokeless tobacco: Never   Tobacco comments:    OVER 30 YEARS HX OF SMOKING   Vaping Use   Vaping Use: Never used  Substance and Sexual Activity   Alcohol use: Not Currently    Comment: heavy drinker until 2004   Drug use: Not Currently    Types: Marijuana   Sexual activity: Not Currently  Other Topics Concern   Not on file  Social History Narrative   Married for 19 years.Works for [employer] from home.   Social Determinants of Health   Financial Resource Strain: Low Risk  (03/02/2020)   Overall Financial Resource Strain (CARDIA)    Difficulty of Paying Living Expenses: Not hard at all  Food Insecurity: No Food Insecurity (03/02/2020)   Hunger Vital Sign    Worried About Running Out of Food in the Last Year: Never true    Ran Out of Food in the Last Year: Never true   Transportation Needs: No Transportation Needs (03/02/2020)   PRAPARE - Hydrologist (Medical): No    Lack of Transportation (Non-Medical): No  Physical Activity: Inactive (03/02/2020)   Exercise Vital Sign    Days of Exercise per Week: 0 days    Minutes of Exercise per Session: 0 min  Stress: No Stress Concern Present (03/02/2020)   [place]    Feeling of Stress : Only a little  Social Connections: Moderately Integrated (03/02/2020)   Social Connection and Isolation Panel [NHANES]    Frequency of Communication with Friends and Family: Three times a week    Frequency of Social Gatherings with Friends and Family: Never    Attends Religious Services: Never    Marine scientist or Organizations: Yes    Attends Archivist Meetings: 1 to 4 times per year    Marital Status: Married  Human resources officer Violence: Not At Risk (03/02/2020)   Humiliation,  Afraid, Rape, and Kick questionnaire    Fear of Current or Ex-Partner: No    Emotionally Abused: No    Physically Abused: No    Sexually Abused: No    FAMILY HISTORY:  Family History  Problem Relation Age of Onset   Emphysema Mother    Alzheimer's disease Father    Prostate cancer Father        dx in his 65s   Prostate cancer Brother        dx in his 29s   Breast cancer Maternal Grandmother        60s   Pancreatic cancer Maternal Grandfather        33s   Dementia Paternal Grandmother    Heart attack Paternal Grandfather    Colon cancer Neg Hx     CURRENT MEDICATIONS:  Current Outpatient Medications  Medication Sig Dispense Refill   abiraterone acetate (ZYTIGA) 250 MG tablet TAKE 4 TABLETS DAILY ON AN EMPTY STOMACH, 1 HOUR BEFORE OR 2 HOURS AFTER A MEAL. 120 tablet 1   arginine 500 MG tablet Take 500 mg by mouth 2 (two) times daily.     Cholecalciferol 125 MCG (5000 UT) capsule Take 5,000-10,000 Units by mouth See admin  instructions. Take 10000 units in the morning and 5000 units at night     DANDELION ROOT PO Take 500 mg by mouth daily.     dutasteride (AVODART) 0.5 MG capsule Take 1 capsule (0.5 mg total) by mouth daily. 90 capsule 3   estradiol (VIVELLE-DOT) 0.1 MG/24HR patch Place 1 patch (0.1 mg total) onto the skin 2 (two) times a week. 8 patch 12   ibuprofen (ADVIL) 200 MG tablet Take 400 mg by mouth every 6 (six) hours as needed for moderate pain.     Leuprolide Acetate, 6 Month, (LUPRON DEPOT, 63-MONTH,) 45 MG injection Inject 45 mg into the muscle every 6 (six) months.     MAGNESIUM PO Take 1 tablet by mouth at bedtime.     MELATONIN PO Take 40 mg by mouth at bedtime.     MILK THISTLE PO Take 1 capsule by mouth daily.     NATTOKINASE PO Take 2 capsules by mouth daily.     NP THYROID 120 MG tablet TAKE 1/2 TABLET(60 MG) BY MOUTH EVERY MORNING 45 tablet 0   olmesartan (BENICAR) 20 MG tablet TAKE 1 TABLET(20 MG) BY MOUTH DAILY 90 tablet 0   pantoprazole (PROTONIX) 40 MG tablet TAKE 1 TABLET BY MOUTH DAILY 30 tablet 5   Pitavastatin Calcium 2 MG TABS Take 1 tablet (2 mg total) by mouth daily. 90 tablet 1   predniSONE (DELTASONE) 5 MG tablet TAKE 1 TABLET(5 MG) BY MOUTH DAILY WITH BREAKFAST 30 tablet 6   QUERCETIN PO Take 1 tablet by mouth 2 (two) times daily.     thiamine (VITAMIN B-1) 50 MG tablet Take 1 tablet (50 mg total) by mouth daily. 90 tablet 1   VITAMIN K PO Take 1 capsule by mouth every other day.     Zinc 30 MG CAPS Take 30 mg by mouth daily.     No current facility-administered medications for this visit.    ALLERGIES:  Allergies  Allergen Reactions   Crestor [Rosuvastatin] Other (See Comments)    myalgia   Ivp Dye [Iodinated Contrast Media] Swelling    Swelling on head, took Benadryl and swelling reduced   Wellbutrin [Bupropion] Hives    PHYSICAL EXAM:  Performance status (ECOG): 0 - Asymptomatic  There  were no vitals filed for this visit. Wt Readings from Last 3 Encounters:   08/24/21 194 lb (88 kg)  06/01/21 188 lb (85.3 kg)  05/23/21 189 lb 3.2 oz (85.8 kg)   Physical Exam Vitals reviewed.  Constitutional:      Appearance: Normal appearance. He is obese.  Cardiovascular:     Rate and Rhythm: Normal rate and regular rhythm.     Pulses: Normal pulses.     Heart sounds: Normal heart sounds.  Pulmonary:     Effort: Pulmonary effort is normal.     Breath sounds: Normal breath sounds.  Neurological:     General: No focal deficit present.     Mental Status: He is alert and oriented to person, place, and time.  Psychiatric:        Mood and Affect: Mood normal.        Behavior: Behavior normal.      LABORATORY DATA:  I have reviewed the labs as listed.     Latest Ref Rng & Units 08/30/2021   12:43 PM 08/24/2021    1:36 PM 05/16/2021    1:04 PM  CBC  WBC 4.0 - 10.5 K/uL 6.5  6.5  5.2   Hemoglobin 13.0 - 17.0 g/dL 12.4  13.1  12.8   Hematocrit 39.0 - 52.0 % 35.6  37.9  36.0   Platelets 150 - 400 K/uL 210  213.0  216       Latest Ref Rng & Units 08/30/2021   12:43 PM 05/16/2021    1:04 PM 02/08/2021    1:12 PM  CMP  Glucose 70 - 99 mg/dL 102  114  107   BUN 8 - 23 mg/dL 18  15  13    Creatinine 0.61 - 1.24 mg/dL 0.97  0.77  0.78   Sodium 135 - 145 mmol/L 135  132  136   Potassium 3.5 - 5.1 mmol/L 4.0  3.7  3.7   Chloride 98 - 111 mmol/L 105  104  107   CO2 22 - 32 mmol/L 24  19  21    Calcium 8.9 - 10.3 mg/dL 9.1  8.7  8.7   Total Protein 6.5 - 8.1 g/dL 6.6  6.8  6.4   Total Bilirubin 0.3 - 1.2 mg/dL 1.0  1.1  0.9   Alkaline Phos 38 - 126 U/L 64  69  76   AST 15 - 41 U/L 14  14  14    ALT 0 - 44 U/L 12  10  14      DIAGNOSTIC IMAGING:  I have independently reviewed the scans and discussed with the patient. DG Forearm Right  Result Date: 08/25/2021 CLINICAL DATA:  Mass for 3 months. EXAM: RIGHT FOREARM - 2 VIEW COMPARISON:  None Available. FINDINGS: There is an oval soft tissue density bulging the dorsal skin at the dorsal aspect of the mid to  distal forearm, appearing to be superficial to the forearm musculature. On frontal view this appears to be centered at the lateral aspect of the radial diaphysis. This measures approximately 2 cm in greatest dimension. No internal calcific density. Minimal degenerative spurring of the tip of the coronoid process. Moderate thumb metacarpophalangeal joint space narrowing and peripheral osteophytes. IMPRESSION: The palpable mass corresponds to a bulging soft tissue well-circumscribed oval density that appears to be centered within the subcutaneous fat superficial to the dorsal forearm musculature. This is nonspecific. Electronically Signed   By: Yvonne Kendall M.D.   On: 08/25/2021 18:42  ASSESSMENT:  1.  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  Prominent left hydronephrosis and hydroureter noted.  New sclerotic lesion in the left anterior vertebral body of L1.  Mild left periaortic adenopathy increased from prior, short axis lymph node measuring 1 cm. -Last PSA increased to 15.8 on 09/28/2019. -As there is rapid progression of disease, I have recommended docetaxel chemotherapy.  He is agreeable after long discussion. -Cystoscopy with transurethral resection of tumor in the posterior bladder neck, right and left walls of the bladder, right lobe of the prostate on 11/10/2019. -Plan was to do 6 cycles of docetaxel followed by abiraterone and prednisone. -6 cycles of docetaxel from 11/16/2019 through 02/29/2020. -CTAP on 01/06/2020 showed persistent thickening of the urinary bladder wall.  Left hydronephrosis is significantly reduced compared to prior exam.  Left-sided retroperitoneal and left iliac lymph nodes reduced in size.  Interval increase in sclerosis of lesions of the left aspect of L2 vertebral body consistent with treatment response.  No  new bone lesions. -Bone scan on 01/06/2020 shows stable L2 metastatic focus. -Bone scan scan images from 06/14/2020 which showed progressive metastatic disease, with increased size and intensity of L2 1 new focus of activity at L1. 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 - Abiraterone and prednisone started on 06/24/2020.   2.  Left hydroureteronephrosis: -CT scan showed prostate mass invading the posterior bladder and obstructing the ureter.  Creatinine increased to 1.1. -Renal ultrasound on 12/11/2019 showed interval resolution of previously seen left-sided hydronephrosis.  Previously seen soft tissue mass at the posterior bladder lumen no longer seen.   PLAN:  1.  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  He has fatigue and abdominal weight gain. -Last Eligard injection was in March 2023. - We reviewed labs from 08/30/2021 which showed normal LFTs, creatinine and calcium.  CBC was grossly normal.  PSA was elevated at 3.58, up from 1.54 on 05/16/2021. - PSA has been going up for the last 3 times. - Recommend discontinuing Zytiga. - Recommend tapering prednisone 5 mg every other day for next 4 weeks and stop. - Recommend PSMA PET CT scan. - Treatment options include darolutamide/cabazitaxel/docetaxel.  Pluvicto is on backorder.   2.  Hot flashes: -Continue estradiol patch 0.1 mg twice weekly.  It is helping with hot flashes.   3.  Peripheral neuropathy: -Left foot tingling is stable.   4.  Lower back pain/bilateral hip pains: -He reports slight worsening of hip pains and lower back pain. - We will give him tramadol 50 mg to be taken at bedtime and daytime only if needed.  5.  Hypothyroidism: -Continue NP thyroid on 20 mg half tablet daily.  6.  Osteoporosis: -DEXA scan on 02/24/2021 with T score of -2.5. - He is taking vitamin D 5000 units daily.  Vitamin D level today 67. - Continue calcium 600 mg  daily.   Orders placed this encounter:  No orders of the defined types were placed in this encounter.    [name], MD [place] [phone]   I, Thana Ates, am acting as a scribe for Dr. [name].  I, [name] MD, have reviewed the above documentation for accuracy and completeness, and I agree with the above.

## 2021-09-06 NOTE — Telephone Encounter (Signed)
Check status on PA rec'd msg.Marland KitchenMarland Kitchen"Your information has been submitted. [facility] [employer] is reviewing the PA request and you will receive an electronic response.".Marland KitchenJohny Chess

## 2021-09-06 NOTE — Addendum Note (Signed)
Addended by: [name] on: 09/06/2021 04:59 PM   Modules accepted: Orders

## 2021-09-07 ENCOUNTER — Encounter: Payer: Self-pay | Admitting: Orthopedic Surgery

## 2021-09-07 ENCOUNTER — Ambulatory Visit: Payer: BC Managed Care – PPO | Admitting: Orthopedic Surgery

## 2021-09-07 VITALS — Ht 66.0 in | Wt 194.0 lb

## 2021-09-07 DIAGNOSIS — R2231 Localized swelling, mass and lump, right upper limb: Secondary | ICD-10-CM | POA: Diagnosis not present

## 2021-09-07 NOTE — Progress Notes (Signed)
Office Visit Note   Patient: Scott [name]           Date of Birth: 30-Dec-1955           MRN: [id number] Visit Date: 09/07/2021              Requested by: Janith Lima, MD [address] [place],  [place] [address] PCP: Janith Lima, MD   Assessment & Plan: Visit Diagnoses:  1. Forearm mass, right     Plan: Patient has a approximately 3 x 3 cm round, firm, mobile mass at the ulnar aspect of the dorsal forearm.  The mass seems to transilluminate.  We discussed ultrasound versus MRI to further characterize the lesion.  After discussion, we decided to proceed with an ultrasound.  Patient does have a history of metastatic prostate cancer.  I can see him back in the office once the ultrasound is completed.  Follow-Up Instructions: No follow-ups on file.   Orders:  Orders Placed This Encounter  Procedures   [place] RT UPPER EXTREM LTD SOFT TISSUE NON VASCULAR   No orders of the defined types were placed in this encounter.     Procedures: No procedures performed   Clinical Data: No additional findings.   Subjective: Chief Complaint  Patient presents with   Right Forearm - Mass    LEFT Handed, DOI: 02/04/2020- dog bite, Pain: 5-6/10 when he hits it, originally he had a small hard area but it grew and over the last 2-3 months it has gone crazy.    This is a 66 year old left-hand-dominant male who presents with a mass in the dorsal ulnar aspect of the right forearm.  He thinks it was first noticed in 2021 following a dog bite to the area.  Originally it was a small, pea-sized firm area but started growing.  He does note that is has gotten significantly larger in the last 3 months or so.  He has no pain in the area at rest.  He does have pain when the area is bumped hard enough.  The area after the dog bite was never red, swollen, or draining.  There were no signs of infection at that time.  He does have a history of metastatic prostate cancer which is being treated.  He has a PET  scan coming up in the near future.    Review of Systems   Objective: Vital Signs: Ht '5\' 6"'$  (1.676 m)   Wt 194 lb (88 kg)   BMI 31.31 kg/m   Physical Exam Constitutional:      Appearance: Normal appearance.  Cardiovascular:     Rate and Rhythm: Normal rate.     Pulses: Normal pulses.  Pulmonary:     Effort: Pulmonary effort is normal.  Skin:    General: Skin is warm and dry.     Capillary Refill: Capillary refill takes less than 2 seconds.  Neurological:     Mental Status: He is alert.     Right Hand Exam   Tenderness  The patient is experiencing no tenderness.   Range of Motion  The patient has normal right wrist ROM.   Other  Erythema: absent Sensation: normal Pulse: present  Comments:  Approximately 3 x 3 cm mass at dorsal and ulnar aspect of mid forearm.  Mass is firm, round, well circumscribed, subcutaneous, and mobile.  It seems to transilluminate.       Specialty Comments:  No specialty comments available.  Imaging: No results found.  PMFS History: Patient Active Problem List   Diagnosis Date Noted   Mass of skin of right forearm 08/24/2021   Hypertension 01/09/2021   Screen for colon cancer 01/09/2021   Encounter for general adult medical examination with abnormal findings 01/09/2021   Hyperlipidemia with target LDL less than 100 01/09/2021   Hypothyroidism    Malignant neoplasm of prostate metastatic to bone ([place]) 07/19/2020   Colon cancer screening 05/30/2020   Genetic testing 04/20/2020   Goals of care, counseling/discussion 11/04/2019   HLD (hyperlipidemia) 01/26/2019   Vitamin D deficiency disease 01/26/2019   Prostate cancer metastatic to multiple sites ([place]) 01/25/2017   Past Medical History:  Diagnosis Date   Bladder obstruction    Complication of anesthesia    Diverticulitis 2015   Dyspnea    Essential hypertension, benign 01/26/2019   at [place] office elevated, normal at home, no medications at this time   History of  basal cell cancer    HLD (hyperlipidemia) 01/26/2019   Hypothyroidism    history of, not currently taking medication   PONV (postoperative nausea and vomiting)    Prostate cancer ([place])    metastatic to bones   Vitamin D deficiency disease 01/26/2019    Family History  Problem Relation Age of Onset   Emphysema Mother    Alzheimer's disease Father    Prostate cancer Father        dx in his 79s   Prostate cancer Brother        dx in his 44s   Breast cancer Maternal Grandmother        60s   Pancreatic cancer Maternal Grandfather        12s   Dementia Paternal Grandmother    Heart attack Paternal Grandfather    Colon cancer Neg Hx     Past Surgical History:  Procedure Laterality Date   CYSTOSCOPY W/ URETERAL STENT PLACEMENT Left 11/10/2019   Procedure: CYSTOSCOPY;  Surgeon: Irine Seal, MD;  [facility];  Service: Urology;  Laterality: Left;   ESOPHAGOGASTRODUODENOSCOPY (EGD) WITH PROPOFOL N/A 07/07/2020   Procedure: ESOPHAGOGASTRODUODENOSCOPY (EGD) WITH PROPOFOL;  Surgeon: Daneil Dolin, MD;  [facility];  Service: Endoscopy;  Laterality: N/A;  pm appt   HERNIA REPAIR  [id number]   UMBILICAL    MALONEY DILATION N/A 07/07/2020   Procedure: MALONEY DILATION;  Surgeon: Daneil Dolin, MD;  [facility];  Service: Endoscopy;  Laterality: N/A;   PROSTATE SURGERY     SKIN CANCER EXCISION     TONSILLECTOMY Bilateral    TOOTH EXTRACTION     front left tooth pulled but no surgery or stitches   TRANSURETHRAL RESECTION OF PROSTATE N/A 01/25/2017   Procedure: TRANSURETHRAL RESECTION OF THE PROSTATE (TURP);  Surgeon: Alexis Frock, MD;  [facility];  Service: Urology;  Laterality: N/A;   TRANSURETHRAL RESECTION OF PROSTATE N/A 11/10/2019   Procedure: TRANSURETHRAL RESECTION OF THE PROSTATE (TURP);  Surgeon: Irine Seal, MD;  [facility];  Service: Urology;  Laterality: N/A;   WISDOM TOOTH EXTRACTION     Social History   Occupational History    Comment:  working full time  Tobacco Use   Smoking status: Former    Packs/day: 1.50    Years: 35.00    Total pack years: 52.50    Types: Cigarettes    Quit date: 01/12/2004    Years since quitting: 17.6   Smokeless tobacco: Never   Tobacco comments:    OVER 30 YEARS  HX OF SMOKING   Vaping Use   Vaping Use: Never used  Substance and Sexual Activity   Alcohol use: Not Currently    Comment: heavy drinker until 2004   Drug use: Not Currently    Types: Marijuana   Sexual activity: Not Currently

## 2021-09-13 ENCOUNTER — Ambulatory Visit
Admission: RE | Admit: 2021-09-13 | Discharge: 2021-09-13 | Disposition: A | Discharge status: Home / Self Care | Payer: BC Managed Care – PPO | Source: Ambulatory Visit | Attending: Orthopedic Surgery | Admitting: Orthopedic Surgery

## 2021-09-13 DIAGNOSIS — R2231 Localized swelling, mass and lump, right upper limb: Secondary | ICD-10-CM

## 2021-09-14 NOTE — Telephone Encounter (Signed)
Tried calling # on card could not get through. Resubmitted PA w/ (Key: [id number]). PA sent to [employer].Marland KitchenJohny Chess

## 2021-09-21 ENCOUNTER — Ambulatory Visit (HOSPITAL_COMMUNITY): Payer: BC Managed Care – PPO

## 2021-09-21 NOTE — Telephone Encounter (Signed)
Check status on PA rec'd msg " Prior authorization in place - if requesting an increased quantity, a quantity limit review can be submitted via fax".Marland KitchenJohny Chess

## 2021-09-27 ENCOUNTER — Ambulatory Visit (HOSPITAL_COMMUNITY): Payer: BC Managed Care – PPO | Admitting: Hematology

## 2021-09-28 ENCOUNTER — Ambulatory Visit (HOSPITAL_COMMUNITY)
Admission: RE | Admit: 2021-09-28 | Discharge: 2021-09-28 | Disposition: A | Discharge status: Home / Self Care | Payer: BC Managed Care – PPO | Source: Ambulatory Visit | Attending: Hematology | Admitting: Hematology

## 2021-09-28 DIAGNOSIS — C61 Malignant neoplasm of prostate: Secondary | ICD-10-CM | POA: Diagnosis present

## 2021-09-28 MED ORDER — PIFLIFOLASTAT F 18 (PYLARIFY) INJECTION
9.0000 | Freq: Once | INTRAVENOUS | Status: AC
  Administered 2021-09-28: 9 via INTRAVENOUS

## 2021-10-04 ENCOUNTER — Other Ambulatory Visit (HOSPITAL_COMMUNITY): Payer: Self-pay

## 2021-10-04 ENCOUNTER — Encounter (HOSPITAL_COMMUNITY): Payer: Self-pay | Admitting: Hematology

## 2021-10-04 ENCOUNTER — Encounter (HOSPITAL_COMMUNITY): Payer: Self-pay | Admitting: Pharmacist

## 2021-10-04 ENCOUNTER — Telehealth (HOSPITAL_COMMUNITY): Payer: Self-pay | Admitting: Pharmacist

## 2021-10-04 ENCOUNTER — Inpatient Hospital Stay (HOSPITAL_COMMUNITY): Payer: BC Managed Care – PPO | Attending: Hematology | Admitting: Hematology

## 2021-10-04 ENCOUNTER — Telehealth: Payer: Self-pay | Admitting: Pharmacy Technician

## 2021-10-04 VITALS — BP 156/97 | HR 56 | Temp 97.8°F | Resp 16 | Wt 197.8 lb

## 2021-10-04 DIAGNOSIS — M810 Age-related osteoporosis without current pathological fracture: Secondary | ICD-10-CM | POA: Diagnosis not present

## 2021-10-04 DIAGNOSIS — I10 Essential (primary) hypertension: Secondary | ICD-10-CM | POA: Insufficient documentation

## 2021-10-04 DIAGNOSIS — E785 Hyperlipidemia, unspecified: Secondary | ICD-10-CM | POA: Diagnosis not present

## 2021-10-04 DIAGNOSIS — E039 Hypothyroidism, unspecified: Secondary | ICD-10-CM | POA: Diagnosis not present

## 2021-10-04 DIAGNOSIS — Z85828 Personal history of other malignant neoplasm of skin: Secondary | ICD-10-CM | POA: Insufficient documentation

## 2021-10-04 DIAGNOSIS — Z87891 Personal history of nicotine dependence: Secondary | ICD-10-CM | POA: Diagnosis not present

## 2021-10-04 DIAGNOSIS — C61 Malignant neoplasm of prostate: Secondary | ICD-10-CM | POA: Diagnosis not present

## 2021-10-04 DIAGNOSIS — Z803 Family history of malignant neoplasm of breast: Secondary | ICD-10-CM | POA: Insufficient documentation

## 2021-10-04 DIAGNOSIS — Z8042 Family history of malignant neoplasm of prostate: Secondary | ICD-10-CM | POA: Diagnosis not present

## 2021-10-04 DIAGNOSIS — Z79890 Hormone replacement therapy (postmenopausal): Secondary | ICD-10-CM | POA: Diagnosis not present

## 2021-10-04 DIAGNOSIS — Z79899 Other long term (current) drug therapy: Secondary | ICD-10-CM | POA: Diagnosis not present

## 2021-10-04 DIAGNOSIS — Z9221 Personal history of antineoplastic chemotherapy: Secondary | ICD-10-CM | POA: Insufficient documentation

## 2021-10-04 DIAGNOSIS — Z7952 Long term (current) use of systemic steroids: Secondary | ICD-10-CM | POA: Diagnosis not present

## 2021-10-04 DIAGNOSIS — C7951 Secondary malignant neoplasm of bone: Secondary | ICD-10-CM | POA: Diagnosis not present

## 2021-10-04 DIAGNOSIS — Z191 Hormone sensitive malignancy status: Secondary | ICD-10-CM | POA: Diagnosis not present

## 2021-10-04 DIAGNOSIS — N1330 Unspecified hydronephrosis: Secondary | ICD-10-CM | POA: Diagnosis not present

## 2021-10-04 DIAGNOSIS — G629 Polyneuropathy, unspecified: Secondary | ICD-10-CM | POA: Insufficient documentation

## 2021-10-04 MED ORDER — NUBEQA 300 MG PO TABS
600.0000 mg | ORAL_TABLET | Freq: Two times a day (BID) | ORAL | 3 refills | Status: DC
  Filled 2021-10-04: qty 120, 30d supply, fill #0

## 2021-10-04 MED ORDER — NUBEQA 300 MG PO TABS: 600.0000 mg | ORAL_TABLET | Freq: Two times a day (BID) | ORAL | 3 refills | Status: DC

## 2021-10-04 NOTE — Telephone Encounter (Signed)
Oral Oncology Patient Advocate Encounter  Prior Authorization for [facility] has been approved.    PA# [id number] Effective dates: 10/04/2021 through 10/05/2022  Patient must fill at [facility].    Lady Deutscher, CPhT-Adv Pharmacy Patient Advocate Specialist [place] Patient Advocate Team Direct Number: [phone]  Fax: [phone]

## 2021-10-04 NOTE — Patient Instructions (Signed)
[place] at [facility] Discharge Instructions   You were seen and examined today by Dr. Delton Coombes.  He discussed the results of your scan which shows new lighting up of prostate cancer in your spine.   He discussed treating this with a pill called Nubeqa. We will plan to fall back on a pill called Xtandi if the [place] is not approved.   Return as scheduled.       Thank you for choosing [place] at [facility] to provide your oncology and hematology care.  To afford each patient quality time with our provider, please arrive at least 15 minutes before your scheduled appointment time.   If you have a lab appointment with the [place] please come in thru the Main Entrance and check in at the main information desk.  You need to re-schedule your appointment should you arrive 10 or more minutes late.  We strive to give you quality time with our providers, and arriving late affects you and other patients whose appointments are after yours.  Also, if you no show three or more times for appointments you may be dismissed from the clinic at the providers discretion.     Again, thank you for choosing [facility].  Our hope is that these requests will decrease the amount of time that you wait before being seen by our physicians.       _____________________________________________________________  Should you have questions after your visit to [facility], please contact our office at [phone] and follow the prompts.  Our office hours are 8:00 a.m. and 4:30 p.m. Monday - Friday.  Please note that voicemails left after 4:00 p.m. may not be returned until the following business day.  We are closed weekends and major holidays.  You do have access to a nurse 24-7, just call the main number to the clinic [phone] and do not press any options, hold on the line and a nurse will answer the phone.    For prescription refill  requests, have your pharmacy contact our office and allow 72 hours.    Due to Covid, you will need to wear a mask upon entering the hospital. If you do not have a mask, a mask will be given to you at the Main Entrance upon arrival. For doctor visits, patients may have 1 support person age 16 or older with them. For treatment visits, patients can not have anyone with them due to social distancing guidelines and our immunocompromised population.

## 2021-10-04 NOTE — Telephone Encounter (Signed)
Oral Oncology Patient Advocate Encounter   Received notification that prior authorization for Nubeqa is required.   PA submitted on 10/04/2021 Key [id number] Status is pending     Lady Deutscher, CPhT-Adv Pharmacy Patient Advocate Specialist [place] Patient Advocate Team Direct Number: [phone]  Fax: [phone]

## 2021-10-04 NOTE — Telephone Encounter (Signed)
Oral Chemotherapy Pharmacist Encounter  [name] plans on reach out to [facility] to set up medication delivery.  Patient Education I spoke with patient for overview of new oral chemotherapy medication: Nubeqa (darolutamide) for the treatment of metastatic castration resistant prostate cancer in conjunction with ADT, planned duration until disease progression or unacceptable drug toxicity.   Pt is doing well. Counseled patient on administration, dosing, side effects, monitoring, drug-food interactions, safe handling, storage, and disposal. Patient will take 2 tablets (600 mg total) by mouth 2 (two) times daily with a meal.  Side effects include but not limited to: decreased wbc and changes in liver function.    Reviewed with patient importance of keeping a medication schedule and plan for any missed doses.  After discussion with patient no patient barriers to medication adherence identified.   [name] voiced understanding and appreciation. All questions answered. Medication handout provided.  Provided patient with Oral [place] [facility] phone number. Patient knows to call the office with questions or concerns. Oral Chemotherapy Navigation Clinic will continue to follow.  Darl Pikes, PharmD, BCPS, BCOP, CPP Hematology/Oncology Clinical Pharmacist Practitioner [place]/DB/AP Oral [place] [facility] [phone]  10/04/2021 3:55 PM

## 2021-10-04 NOTE — Telephone Encounter (Signed)
Oral Oncology Pharmacist Encounter  Received new prescription for Nubeqa (darolutamide) for the treatment of metastatic castration resistant prostate cancer in conjunction with ADT, planned duration until disease progression or unacceptable drug toxicity.  CMP from 08/30/21 assessed, no relevant lab abnormalities. Prescription dose and frequency assessed.   Current medication list in Epic reviewed, one DDIs with darolutamide identified: Pitavastatin: Darolutamide may increase the serum concentration of pitavastatin. Monitor patients for increased toxicities and effects of these. No baseline dose adjustment needed.   Evaluated chart and no patient barriers to medication adherence identified.   Prescription has been e-scribed to [facility].  Oral Oncology Clinic will continue to follow for insurance authorization, copayment issues, initial counseling and start date.  Darl Pikes, PharmD, BCPS, BCOP, CPP Hematology/Oncology Clinical Pharmacist Practitioner [place]/[facility]/AP Oral [place] [facility] [phone]  10/04/2021 3:27 PM

## 2021-10-04 NOTE — Progress Notes (Signed)
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 TURP on 11/10/2019. 2. Docetaxel x 6 cycles from 11/16/2019 to 02/29/2020.  NGS Results: not done  CURRENT THERAPY: Zytiga 1,000 mg daily  BRIEF ONCOLOGIC HISTORY:  Oncology History  Prostate cancer metastatic to multiple sites [facility])  01/25/2017 Initial Diagnosis   Prostate cancer metastatic to multiple sites [facility])   11/16/2019 - 03/02/2020 Chemotherapy   Patient is on Treatment Plan : PROSTATE Docetaxel + Prednisone q21d      Genetic Testing   Negative genetic testing. No pathogenic variants identified on the [facility] CancerNext-Expanded panel. 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 The report date is 04/19/2020.  The CancerNext-Expanded  gene panel offered by [facility] and includes sequencing and rearrangement analysis for the following 77 genes: IP, ALK, APC*, ATM*, AXIN2, BAP1, BARD1, BLM, BMPR1A, BRCA1*, BRCA2*, BRIP1*, CDC73, CDH1*,CDK4, CDKN1B, CDKN2A, CHEK2*, CTNNA1, DICER1, FANCC, FH, FLCN, GALNT12, KIF1B, LZTR1, MAX, MEN1, MET, MLH1*, MSH2*, MSH3, MSH6*, MUTYH*, NBN, NF1*, NF2, NTHL1, PALB2*, PHOX2B, PMS2*, POT1, PRKAR1A, PTCH1, PTEN*, RAD51C*, RAD51D*,RB1, RECQL, RET, SDHA, SDHAF2, SDHB, SDHC, SDHD, SMAD4, SMARCA4, SMARCB1, SMARCE1, STK11, SUFU, TMEM127, TP53*,TSC1, TSC2, VHL and XRCC2 (sequencing and deletion/duplication); EGFR, EGLN1, HOXB13, KIT, MITF, PDGFRA, POLD1 and POLE (sequencing only); EPCAM and GREM1 (deletion/duplication only).      CANCER STAGING: Cancer Staging  No matching staging information was found for the patient.  INTERVAL HISTORY:  Scott [name], a 66 y.o. male, returns for routine follow-up of his metastatic castration resistant prostate  cancer to bones. [name] was last seen on 09/06/2021.   Today he reports feeling well. He continues to have hip pain which has worsened since tapering prednisone.   REVIEW OF SYSTEMS:  Review of Systems  Constitutional:  Positive for fatigue.  Musculoskeletal:  Positive for arthralgias (1/10 hips and R knee) and back pain (1/10).  Neurological:  Positive for light-headedness and numbness (L foot).  All other systems reviewed and are negative.   PAST MEDICAL/SURGICAL HISTORY:  Past Medical History:  Diagnosis Date   Bladder obstruction    Complication of anesthesia    Diverticulitis 2015   Dyspnea    Essential hypertension, benign 01/26/2019   at [place] office elevated, normal at home, no medications at this time   History of basal cell cancer    HLD (hyperlipidemia) 01/26/2019   Hypothyroidism    history of, not currently taking medication   PONV (postoperative nausea and vomiting)    Prostate cancer ([place])    metastatic to bones   Vitamin D deficiency disease 01/26/2019   Past Surgical History:  Procedure Laterality Date   CYSTOSCOPY W/ URETERAL STENT PLACEMENT Left 11/10/2019   Procedure: CYSTOSCOPY;  Surgeon: Scott Seal, MD;  Location: [facility];  Service: Urology;  Laterality: Left;   ESOPHAGOGASTRODUODENOSCOPY (EGD) WITH PROPOFOL N/A 07/07/2020   Procedure: ESOPHAGOGASTRODUODENOSCOPY (EGD) WITH PROPOFOL;  Surgeon: Scott Dolin, MD;  Location: [facility];  Service: Endoscopy;  Laterality: N/A;  pm appt   HERNIA REPAIR  [id number]   UMBILICAL    MALONEY DILATION N/A 07/07/2020   Procedure: MALONEY DILATION;  Surgeon: Scott Dolin, MD;  Location: [facility];  Service: Endoscopy;  Laterality: N/A;   PROSTATE SURGERY     SKIN  CANCER EXCISION     TONSILLECTOMY Bilateral    TOOTH EXTRACTION     front left tooth pulled but no surgery or stitches   TRANSURETHRAL RESECTION OF PROSTATE N/A 01/25/2017   Procedure: TRANSURETHRAL RESECTION OF THE PROSTATE (TURP);  Surgeon: Scott Frock, MD;  Location: [facility];  Service: Urology;  Laterality: N/A;   TRANSURETHRAL RESECTION OF PROSTATE N/A 11/10/2019   Procedure: TRANSURETHRAL RESECTION OF THE PROSTATE (TURP);  Surgeon: Scott Seal, MD;  Location: [facility];  Service: Urology;  Laterality: N/A;   WISDOM TOOTH EXTRACTION      SOCIAL HISTORY:  Social History   Socioeconomic History   Marital status: Married    Spouse name: Not on file   Number of children: 1   Years of education: Not on file   Highest education level: Not on file  Occupational History    Comment: working full time  Tobacco Use   Smoking status: Former    Packs/day: 1.50    Years: 35.00    Total pack years: 52.50    Types: Cigarettes    Quit date: 01/12/2004    Years since quitting: 17.7   Smokeless tobacco: Never   Tobacco comments:    OVER 30 YEARS HX OF SMOKING   Vaping Use   Vaping Use: Never used  Substance and Sexual Activity   Alcohol use: Not Currently    Comment: heavy drinker until 2004   Drug use: Not Currently    Types: Marijuana   Sexual activity: Not Currently  Other Topics Concern   Not on file  Social History Narrative   Married for 19 years.Works for [employer] from home.   Social Determinants of Health   Financial Resource Strain: Low Risk  (03/02/2020)   Overall Financial Resource Strain (CARDIA)    Difficulty of Paying Living Expenses: Not hard at all  Food Insecurity: No Food Insecurity (03/02/2020)   Hunger Vital Sign    Worried About Running Out of Food in the Last Year: Never true    Ran Out of Food in the Last Year: Never true  Transportation Needs: No Transportation Needs (03/02/2020)   PRAPARE - Hydrologist (Medical): No    Lack of Transportation (Non-Medical): No  Physical Activity: Inactive (03/02/2020)   Exercise Vital Sign    Days of Exercise per Week: 0 days    Minutes of Exercise per Session: 0 min  Stress: No Stress Concern Present (03/02/2020)   [name]    Feeling of Stress : Only a little  Social Connections: Moderately Integrated (03/02/2020)   Social Connection and Isolation Panel [NHANES]    Frequency of Communication with Friends and Family: Three times a week    Frequency of Social Gatherings with Friends and Family: Never    Attends Religious Services: Never    Marine scientist or Organizations: Yes    Attends Archivist Meetings: 1 to 4 times per year    Marital Status: Married  Human resources officer Violence: Not At Risk (03/02/2020)   Humiliation, Afraid, Rape, and Kick questionnaire    Fear of Current or Ex-Partner: No    Emotionally Abused: No    Physically Abused: No    Sexually Abused: No    FAMILY HISTORY:  Family History  Problem Relation Age of Onset   Emphysema Mother    Alzheimer's disease Father    Prostate cancer Father  dx in his 57s   Prostate cancer Brother        dx in his 97s   Breast cancer Maternal Grandmother        60s   Pancreatic cancer Maternal Grandfather        51s   Dementia Paternal Grandmother    Heart attack Paternal Grandfather    Colon cancer Neg Hx     CURRENT MEDICATIONS:  Current Outpatient Medications  Medication Sig Dispense Refill   abiraterone acetate (ZYTIGA) 250 MG tablet TAKE 4 TABLETS DAILY ON AN EMPTY STOMACH, 1 HOUR BEFORE OR 2 HOURS AFTER A MEAL. 120 tablet 1   arginine 500 MG tablet Take 500 mg by mouth 2 (two) times daily.     Cholecalciferol 125 MCG (5000 UT) capsule Take 5,000-10,000 Units by mouth See admin instructions. Take 10000 units in the morning and 5000 units at night     DANDELION ROOT PO Take 500 mg by mouth daily.     dutasteride (AVODART) 0.5 MG capsule Take 1 capsule (0.5 mg total) by mouth daily. 90 capsule 3   estradiol (VIVELLE-DOT) 0.1 MG/24HR patch Place 1 patch (0.1 mg total) onto the skin 2 (two) times a week. 8 patch 12   ibuprofen (ADVIL) 200 MG tablet  Take 400 mg by mouth every 6 (six) hours as needed for moderate pain.     Leuprolide Acetate, 6 Month, (LUPRON DEPOT, 16-MONTH,) 45 MG injection Inject 45 mg into the muscle every 6 (six) months.     MAGNESIUM PO Take 1 tablet by mouth at bedtime.     MELATONIN PO Take 40 mg by mouth at bedtime.     MILK THISTLE PO Take 1 capsule by mouth daily.     NATTOKINASE PO Take 2 capsules by mouth daily.     NP THYROID 120 MG tablet TAKE 1/2 TABLET(60 MG) BY MOUTH EVERY MORNING 45 tablet 0   olmesartan (BENICAR) 20 MG tablet TAKE 1 TABLET(20 MG) BY MOUTH DAILY 90 tablet 0   pantoprazole (PROTONIX) 40 MG tablet TAKE 1 TABLET BY MOUTH DAILY 30 tablet 5   Pitavastatin Calcium 2 MG TABS Take 1 tablet (2 mg total) by mouth daily. 90 tablet 1   predniSONE (DELTASONE) 5 MG tablet TAKE 1 TABLET(5 MG) BY MOUTH DAILY WITH BREAKFAST 30 tablet 6   QUERCETIN PO Take 1 tablet by mouth 2 (two) times daily.     thiamine (VITAMIN B-1) 50 MG tablet Take 1 tablet (50 mg total) by mouth daily. 90 tablet 1   traMADol (ULTRAM) 50 MG tablet Take 1 tablet (50 mg total) by mouth at bedtime. Post-operatively 30 tablet 0   traMADol (ULTRAM) 50 MG tablet Take 1 tablet (50 mg total) by mouth at bedtime as needed. 30 tablet 0   VITAMIN K PO Take 1 capsule by mouth every other day.     Zinc 30 MG CAPS Take 30 mg by mouth daily.     No current facility-administered medications for this visit.    ALLERGIES:  Allergies  Allergen Reactions   Crestor [Rosuvastatin] Other (See Comments)    myalgia   Ivp Dye [Iodinated Contrast Media] Swelling    Swelling on head, took Benadryl and swelling reduced   Wellbutrin [Bupropion] Hives    PHYSICAL EXAM:  Performance status (ECOG): 0 - Asymptomatic  There were no vitals filed for this visit. Wt Readings from Last 3 Encounters:  09/07/21 194 lb (88 kg)  09/06/21 193 lb 13.1 oz (87.9  kg)  08/24/21 194 lb (88 kg)   Physical Exam Vitals reviewed.  Constitutional:      Appearance:  Normal appearance. He is obese.  Cardiovascular:     Rate and Rhythm: Normal rate and regular rhythm.     Pulses: Normal pulses.     Heart sounds: Normal heart sounds.  Pulmonary:     Effort: Pulmonary effort is normal.     Breath sounds: Normal breath sounds.  Neurological:     General: No focal deficit present.     Mental Status: He is alert and oriented to person, place, and time.  Psychiatric:        Mood and Affect: Mood normal.        Behavior: Behavior normal.      LABORATORY DATA:  I have reviewed the labs as listed.     Latest Ref Rng & Units 08/30/2021   12:43 PM 08/24/2021    1:36 PM 05/16/2021    1:04 PM  CBC  WBC 4.0 - 10.5 K/uL 6.5  6.5  5.2   Hemoglobin 13.0 - 17.0 g/dL 12.4  13.1  12.8   Hematocrit 39.0 - 52.0 % 35.6  37.9  36.0   Platelets 150 - 400 K/uL 210  213.0  216       Latest Ref Rng & Units 08/30/2021   12:43 PM 05/16/2021    1:04 PM 02/08/2021    1:12 PM  CMP  Glucose 70 - 99 mg/dL 102  114  107   BUN 8 - 23 mg/dL 18  15  13    Creatinine 0.61 - 1.24 mg/dL 0.97  0.77  0.78   Sodium 135 - 145 mmol/L 135  132  136   Potassium 3.5 - 5.1 mmol/L 4.0  3.7  3.7   Chloride 98 - 111 mmol/L 105  104  107   CO2 22 - 32 mmol/L 24  19  21    Calcium 8.9 - 10.3 mg/dL 9.1  8.7  8.7   Total Protein 6.5 - 8.1 g/dL 6.6  6.8  6.4   Total Bilirubin 0.3 - 1.2 mg/dL 1.0  1.1  0.9   Alkaline Phos 38 - 126 U/L 64  69  76   AST 15 - 41 U/L 14  14  14    ALT 0 - 44 U/L 12  10  14      DIAGNOSTIC IMAGING:  I have independently reviewed the scans and discussed with the patient. NM PET (PSMA) SKULL TO MID THIGH  Result Date: 09/30/2021 CLINICAL DATA:  Subsequent treatment strategy for prostate cancer. EXAM: NUCLEAR MEDICINE PET SKULL BASE TO THIGH TECHNIQUE: 9.01 mCi F18 Piflufolastat (Pylarify) was injected intravenously. Full-ring PET imaging was performed from the skull base to thigh after the radiotracer. CT data was obtained and used for attenuation correction and  anatomic localization. COMPARISON:  PET-CT 12/11/2018. CT AP 06/03/2020. NM bone scan 06/14/2020 FINDINGS: NECK No radiotracer activity in neck lymph nodes. Incidental CT finding: None CHEST No radiotracer accumulation within mediastinal or hilar lymph nodes. No suspicious pulmonary nodules on the CT scan. Incidental CT finding: Aortic atherosclerosis and coronary artery calcifications. ABDOMEN/PELVIS Prostate: Signs of previous TURP. There is increased tracer uptake localizing to the residual portions of the prostate gland and extending into the junction of the prostate gland and seminal vesicles. The SUV max is equal to 17.11 and is concerning for residual/recurrent FDG avid tumor, image 266/3. On the previous PET-CT the SUV max was equal to 8.1. Lymph  nodes: No abnormal radiotracer accumulation within pelvic or abdominal nodes. Liver: No evidence of liver metastasis Incidental CT finding: Aortic atherosclerosis. Supraumbilical, paramidline abdominal wall hernias are identified which contain fat only, image 211/3. SKELETON Tracer avid bone metastases localizes to the L2 vertebral body with SUV max 2.7. The corresponding CT images there is heterogeneous increased sclerosis, image 194/3. The degree of sclerosis has increased when compared with 06/03/2020. Mildly tracer avid lesion with progressive sclerosis localizes to the superior and anterior aspect of the L1 vertebral body with SUV max of 3.42, image 181/3. Bilateral gynecomastia IMPRESSION: 1. Signs of residual/recurrent tracer avid tumor involving the prostate gland and seminal vesicles. The degree of tracer uptake within this area has increased when compared with the PET-CT from 12/11/2018. 2. Tracer avid bone metastases localized to the L1 and L2 vertebral bodies. On the corresponding CT images there are progressive sclerotic changes identified. No new sites of osseous metastatic disease identified compared with NM bone scan from 06/09/2020. 3. No signs of  tracer avid nodal metastasis or solid organ metastasis. 4. Aortic Atherosclerosis (ICD10-I70.0). Coronary artery calcifications. Electronically Signed   By: Kerby Moors M.D.   On: 09/30/2021 16:07   [place] RT UPPER EXTREM LTD SOFT TISSUE NON VASCULAR  Result Date: 09/14/2021 CLINICAL DATA:  Mass lesion in the right forearm for 3-4 months. EXAM: ULTRASOUND RIGHT UPPER EXTREMITY LIMITED TECHNIQUE: Ultrasound examination of the upper extremity soft tissues was performed in the area of clinical concern. COMPARISON:  Plain films of the right forearm 08/24/2021. FINDINGS: Scanning directed toward the region of concern demonstrates a solid lesion measuring 1.4 by 2.0 by 2.1 cm. There is no flow within the lesion on Doppler imaging. No fluid collection or other abnormality is identified. IMPRESSION: Solid mass in the region of concern can not be characterized. It could be a benign fibrous tumor given lack of blood flow on Doppler imaging but MRI with and without contrast or biopsy is recommended for further evaluation. Electronically Signed   By: Inge Rise M.D.   On: 09/14/2021 09:04     ASSESSMENT:  1.  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  Prominent left hydronephrosis and hydroureter noted.  New sclerotic lesion in the left anterior vertebral body of L1.  Mild left periaortic adenopathy increased from prior, short axis lymph node measuring 1 cm. -Last PSA increased to 15.8 on 09/28/2019. -As there is rapid progression of disease, I have recommended docetaxel chemotherapy.  He is agreeable after long discussion. -Cystoscopy with transurethral resection of tumor in the posterior bladder neck, right and left walls of the bladder, right lobe of the prostate on 11/10/2019. -Plan was to do 6 cycles of docetaxel followed by abiraterone and  prednisone. -6 cycles of docetaxel from 11/16/2019 through 02/29/2020. -CTAP on 01/06/2020 showed persistent thickening of the urinary bladder wall.  Left hydronephrosis is significantly reduced compared to prior exam.  Left-sided retroperitoneal and left iliac lymph nodes reduced in size.  Interval increase in sclerosis of lesions of the left aspect of L2 vertebral body consistent with treatment response.  No new bone lesions. -Bone scan on 01/06/2020 shows stable L2 metastatic focus. -Bone scan scan images from 06/14/2020 which showed progressive metastatic disease, with increased size and intensity of L2 1 new focus of activity at L1. 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 -Abiraterone and  prednisone from 06/24/2020 through 09/06/2021 with progression. - PSMA PET scan (09/28/2021): Residual/recurrent tumor involving prostate gland and seminal vesicles.  Tracer avid bone metastasis localized L1 and L2 vertebral bodies.  No new sites of bone metastasis disease.  No solid organ or nodal mets.   2.  Left hydroureteronephrosis: -CT scan showed prostate mass invading the posterior bladder and obstructing the ureter.  Creatinine increased to 1.1. -Renal ultrasound on 12/11/2019 showed interval resolution of previously seen left-sided hydronephrosis.  Previously seen soft tissue mass at the posterior bladder lumen no longer seen.   PLAN:  1.  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  He is tapering off prednisone. - We discussed the PSMA PET scan findings which showed residual/recurrent tumor in the prostate gland/seminal vesicle/L1 and L2 vertebral bodies. - Last PSA is elevated at 3.58 on 08/30/2021. - No alarming skin findings to suggest small cell transformation. - We discussed options including androgen receptor blockers/reintroducing docetaxel/cabazitaxel.  Pluvicto is on backorder. - We would like to proceed with androgen receptor blocker.  We talked about  darolutamide 600 mg twice daily.  We discussed side effects in detail.  Literature was given. - RTC 2 weeks after start of darolutamide.   2.  Hot flashes: - Continue estradiol patch 0.1 mg twice weekly for hot flashes.   3.  Peripheral neuropathy: - Left foot tingling is stable.   4.  Lower back pain/bilateral hip pains: - He has bilateral hip pains and lower back pain. - Continue tramadol as needed.  5.  Hypothyroidism: - Continue NP thyroid half tablet daily.  6.  Osteoporosis: - DEXA scan on 02/24/2021 with T score -2.5. - Continue calcium 600 mg daily and vitamin D 5000 units daily.   Orders placed this encounter:  No orders of the defined types were placed in this encounter.    [name], MD [place] [phone]   I, Thana Ates, am acting as a scribe for Dr. [name].  I, [name] MD, have reviewed the above documentation for accuracy and completeness, and I agree with the above.

## 2021-10-05 ENCOUNTER — Other Ambulatory Visit: Payer: Self-pay | Admitting: Internal Medicine

## 2021-10-05 ENCOUNTER — Other Ambulatory Visit (HOSPITAL_COMMUNITY): Payer: Self-pay | Admitting: Hematology

## 2021-10-05 DIAGNOSIS — I10 Essential (primary) hypertension: Secondary | ICD-10-CM

## 2021-10-10 ENCOUNTER — Ambulatory Visit: Payer: BC Managed Care – PPO | Admitting: Orthopedic Surgery

## 2021-10-17 ENCOUNTER — Ambulatory Visit (INDEPENDENT_AMBULATORY_CARE_PROVIDER_SITE_OTHER): Payer: BC Managed Care – PPO | Admitting: Orthopedic Surgery

## 2021-10-17 DIAGNOSIS — R2231 Localized swelling, mass and lump, right upper limb: Secondary | ICD-10-CM | POA: Diagnosis not present

## 2021-10-17 NOTE — Progress Notes (Signed)
Office Visit Note   Patient: Scott [name]           Date of Birth: 04-Aug-1955           MRN: [id number] Visit Date: 10/17/2021              Requested by: Janith Lima, MD [id number] [place],  [place] [address] PCP: Janith Lima, MD   Assessment & Plan: Visit Diagnoses:  1. Mass of skin of right forearm     Plan: We reviewed patient's recent ultrasound of the right upper extremity mass which demonstrates a solid lesion with out any vascularity or blood flow on Doppler exam.  The lesion is asymptomatic but patient would like it removed soon as possible for cosmetic reasons.  We reviewed the risks of surgery including bleeding, infection, damage to neurovascular structures, mass recurrence, delayed wound healing.  Patient like to proceed.  Surgical date will be confirmed with the patient.  Follow-Up Instructions: No follow-ups on file.   Orders:  No orders of the defined types were placed in this encounter.  No orders of the defined types were placed in this encounter.     Procedures: No procedures performed   Clinical Data: No additional findings.   Subjective: Chief Complaint  Patient presents with   Right Forearm - Follow-up    This is a 66 year old left-hand-dominant male who presents with a firm mass to the dorsal ulnar aspect of the right forearm.  He thinks it was noticed around 2021 after a dog bite to this area.  It is grown from a pea-sized firm area to a approximately 2 x 2 centimeter lesion.  He has no pain in the area.  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  He recent underwent ultrasound of this lesion for further characterization.    Review of Systems   Objective: Vital Signs: There were no vitals taken for this visit.  Physical Exam Constitutional:      Appearance: Normal appearance.  Cardiovascular:     Rate and Rhythm: Normal rate.     Pulses: Normal pulses.  Pulmonary:     Effort:  Pulmonary effort is normal.  Skin:    General: Skin is warm and dry.     Capillary Refill: Capillary refill takes less than 2 seconds.  Neurological:     Mental Status: He is alert.     Right Hand Exam   Tenderness  The patient is experiencing no tenderness.   Range of Motion  The patient has normal right wrist ROM.   Other  Erythema: absent Sensation: normal Pulse: present  Comments:  Firm, round, approx 2 x 2 cm mass at dorsal ulnar aspect of mid forearm.  Mass is well circumscribed, mobile, and subcutaneous.       Specialty Comments:  No specialty comments available.  Imaging: No results found.   PMFS History: Patient Active Problem List   Diagnosis Date Noted   Mass of skin of right forearm 08/24/2021   Hypertension 01/09/2021   Screen for colon cancer 01/09/2021   Encounter for general adult medical examination with abnormal findings 01/09/2021   Hyperlipidemia with target LDL less than 100 01/09/2021   Hypothyroidism    Malignant neoplasm of prostate metastatic to bone [facility]) 07/19/2020   Colon cancer screening 05/30/2020   Genetic testing 04/20/2020   Goals of care, counseling/discussion 11/04/2019   HLD (hyperlipidemia) 01/26/2019   Vitamin D deficiency disease 01/26/2019  Prostate cancer metastatic to multiple sites [facility]) 01/25/2017   Past Medical History:  Diagnosis Date   Bladder obstruction    Complication of anesthesia    Diverticulitis 2015   Dyspnea    Essential hypertension, benign 01/26/2019   at [place] office elevated, normal at home, no medications at this time   History of basal cell cancer    HLD (hyperlipidemia) 01/26/2019   Hypothyroidism    history of, not currently taking medication   PONV (postoperative nausea and vomiting)    Prostate cancer ([place])    metastatic to bones   Vitamin D deficiency disease 01/26/2019    Family History  Problem Relation Age of Onset   Emphysema Mother    Alzheimer's disease Father     Prostate cancer Father        dx in his 10s   Prostate cancer Brother        dx in his 23s   Breast cancer Maternal Grandmother        60s   Pancreatic cancer Maternal Grandfather        43s   Dementia Paternal Grandmother    Heart attack Paternal Grandfather    Colon cancer Neg Hx     Past Surgical History:  Procedure Laterality Date   CYSTOSCOPY W/ URETERAL STENT PLACEMENT Left 11/10/2019   Procedure: CYSTOSCOPY;  Surgeon: Irine Seal, MD;  [facility]: [facility];  Service: Urology;  Laterality: Left;   ESOPHAGOGASTRODUODENOSCOPY (EGD) WITH PROPOFOL N/A 07/07/2020   Procedure: ESOPHAGOGASTRODUODENOSCOPY (EGD) WITH PROPOFOL;  Surgeon: Daneil Dolin, MD;  [facility];  Service: Endoscopy;  Laterality: N/A;  pm appt   HERNIA REPAIR  [id number]   UMBILICAL    MALONEY DILATION N/A 07/07/2020   Procedure: MALONEY DILATION;  Surgeon: Daneil Dolin, MD;  [facility];  Service: Endoscopy;  Laterality: N/A;   PROSTATE SURGERY     SKIN CANCER EXCISION     TONSILLECTOMY Bilateral    TOOTH EXTRACTION     front left tooth pulled but no surgery or stitches   TRANSURETHRAL RESECTION OF PROSTATE N/A 01/25/2017   Procedure: TRANSURETHRAL RESECTION OF THE PROSTATE (TURP);  Surgeon: Alexis Frock, MD;  [facility];  Service: Urology;  Laterality: N/A;   TRANSURETHRAL RESECTION OF PROSTATE N/A 11/10/2019   Procedure: TRANSURETHRAL RESECTION OF THE PROSTATE (TURP);  Surgeon: Irine Seal, MD;  [facility];  Service: Urology;  Laterality: N/A;   WISDOM TOOTH EXTRACTION     Social History   Occupational History    Comment: working full time  Tobacco Use   Smoking status: Former    Packs/day: 1.50    Years: 35.00    Total pack years: 52.50    Types: Cigarettes    Quit date: 01/12/2004    Years since quitting: 17.7   Smokeless tobacco: Never   Tobacco comments:    OVER 30 YEARS HX OF SMOKING   Vaping Use   Vaping Use: Never used  Substance and Sexual Activity    Alcohol use: Not Currently    Comment: heavy drinker until 2004   Drug use: Not Currently    Types: Marijuana   Sexual activity: Not Currently

## 2021-10-20 ENCOUNTER — Other Ambulatory Visit (HOSPITAL_COMMUNITY): Payer: Self-pay | Admitting: Hematology

## 2021-10-25 ENCOUNTER — Other Ambulatory Visit: Payer: BC Managed Care – PPO

## 2021-10-25 ENCOUNTER — Ambulatory Visit: Payer: BC Managed Care – PPO | Admitting: Hematology

## 2021-10-27 ENCOUNTER — Encounter (HOSPITAL_BASED_OUTPATIENT_CLINIC_OR_DEPARTMENT_OTHER): Payer: Self-pay | Admitting: Orthopedic Surgery

## 2021-10-27 ENCOUNTER — Other Ambulatory Visit: Payer: Self-pay

## 2021-10-31 ENCOUNTER — Other Ambulatory Visit: Payer: Self-pay

## 2021-10-31 DIAGNOSIS — C61 Malignant neoplasm of prostate: Secondary | ICD-10-CM

## 2021-11-01 ENCOUNTER — Inpatient Hospital Stay: Payer: BC Managed Care – PPO | Attending: Hematology

## 2021-11-01 ENCOUNTER — Inpatient Hospital Stay: Payer: BC Managed Care – PPO | Admitting: Hematology

## 2021-11-01 ENCOUNTER — Telehealth: Payer: Self-pay | Admitting: Orthopedic Surgery

## 2021-11-01 VITALS — BP 127/85 | HR 60 | Temp 98.3°F | Resp 18 | Ht 66.0 in | Wt 197.4 lb

## 2021-11-01 DIAGNOSIS — M25551 Pain in right hip: Secondary | ICD-10-CM | POA: Diagnosis not present

## 2021-11-01 DIAGNOSIS — Z803 Family history of malignant neoplasm of breast: Secondary | ICD-10-CM | POA: Insufficient documentation

## 2021-11-01 DIAGNOSIS — M810 Age-related osteoporosis without current pathological fracture: Secondary | ICD-10-CM | POA: Diagnosis not present

## 2021-11-01 DIAGNOSIS — C7951 Secondary malignant neoplasm of bone: Secondary | ICD-10-CM | POA: Insufficient documentation

## 2021-11-01 DIAGNOSIS — E039 Hypothyroidism, unspecified: Secondary | ICD-10-CM | POA: Diagnosis not present

## 2021-11-01 DIAGNOSIS — C61 Malignant neoplasm of prostate: Secondary | ICD-10-CM

## 2021-11-01 DIAGNOSIS — Z87891 Personal history of nicotine dependence: Secondary | ICD-10-CM | POA: Insufficient documentation

## 2021-11-01 DIAGNOSIS — G629 Polyneuropathy, unspecified: Secondary | ICD-10-CM | POA: Insufficient documentation

## 2021-11-01 DIAGNOSIS — Z8042 Family history of malignant neoplasm of prostate: Secondary | ICD-10-CM | POA: Diagnosis not present

## 2021-11-01 DIAGNOSIS — N1330 Unspecified hydronephrosis: Secondary | ICD-10-CM | POA: Diagnosis not present

## 2021-11-01 DIAGNOSIS — Z800 Family history of malignant neoplasm of digestive organs: Secondary | ICD-10-CM | POA: Diagnosis not present

## 2021-11-01 DIAGNOSIS — M25552 Pain in left hip: Secondary | ICD-10-CM | POA: Insufficient documentation

## 2021-11-01 DIAGNOSIS — I10 Essential (primary) hypertension: Secondary | ICD-10-CM | POA: Insufficient documentation

## 2021-11-01 DIAGNOSIS — M545 Low back pain, unspecified: Secondary | ICD-10-CM | POA: Insufficient documentation

## 2021-11-01 LAB — CBC WITH DIFFERENTIAL/PLATELET
Abs Immature Granulocytes: 0.01 10*3/uL (ref 0.00–0.07)
Basophils Absolute: 0.1 10*3/uL (ref 0.0–0.1)
Basophils Relative: 2 %
Eosinophils Absolute: 0.2 10*3/uL (ref 0.0–0.5)
Eosinophils Relative: 4 %
HCT: 36.2 % — ABNORMAL LOW (ref 39.0–52.0)
Hemoglobin: 13 g/dL (ref 13.0–17.0)
Immature Granulocytes: 0 %
Lymphocytes Relative: 26 %
Lymphs Abs: 1.1 10*3/uL (ref 0.7–4.0)
MCH: 32 pg (ref 26.0–34.0)
MCHC: 35.9 g/dL (ref 30.0–36.0)
MCV: 89.2 fL (ref 80.0–100.0)
Monocytes Absolute: 0.5 10*3/uL (ref 0.1–1.0)
Monocytes Relative: 12 %
Neutro Abs: 2.3 10*3/uL (ref 1.7–7.7)
Neutrophils Relative %: 56 %
Platelets: 228 10*3/uL (ref 150–400)
RBC: 4.06 MIL/uL — ABNORMAL LOW (ref 4.22–5.81)
RDW: 11.8 % (ref 11.5–15.5)
WBC: 4.1 10*3/uL (ref 4.0–10.5)
nRBC: 0 % (ref 0.0–0.2)

## 2021-11-01 LAB — COMPREHENSIVE METABOLIC PANEL
ALT: 17 U/L (ref 0–44)
AST: 15 U/L (ref 15–41)
Albumin: 3.9 g/dL (ref 3.5–5.0)
Alkaline Phosphatase: 59 U/L (ref 38–126)
Anion gap: 6 (ref 5–15)
BUN: 18 mg/dL (ref 8–23)
CO2: 24 mmol/L (ref 22–32)
Calcium: 9.3 mg/dL (ref 8.9–10.3)
Chloride: 105 mmol/L (ref 98–111)
Creatinine, Ser: 1.01 mg/dL (ref 0.61–1.24)
GFR, Estimated: >60 mL/min (ref >60)
Glucose, Bld: 122 mg/dL — ABNORMAL HIGH (ref 70–99)
Potassium: 3.8 mmol/L (ref 3.5–5.1)
Sodium: 135 mmol/L (ref 135–145)
Total Bilirubin: 0.8 mg/dL (ref 0.3–1.2)
Total Protein: 6.7 g/dL (ref 6.5–8.1)

## 2021-11-01 LAB — MAGNESIUM: Magnesium: 2.3 mg/dL (ref 1.7–2.4)

## 2021-11-01 LAB — PSA: Prostatic Specific Antigen: 9.84 ng/mL — ABNORMAL HIGH (ref 0.00–4.00)

## 2021-11-01 NOTE — Patient Instructions (Addendum)
[place] at [facility] Discharge Instructions   You were seen and examined today by Dr. Delton Coombes.  He reviewed the results of your lab work which are normal/stable.   Continue Nubeqa as prescribed.   Return as scheduled in 6 weeks.    Thank you for choosing [place] at [facility] to provide your oncology and hematology care.  To afford each patient quality time with our provider, please arrive at least 15 minutes before your scheduled appointment time.   If you have a lab appointment with the [facility] please come in thru the Main Entrance and check in at the main information desk.  You need to re-schedule your appointment should you arrive 10 or more minutes late.  We strive to give you quality time with our providers, and arriving late affects you and other patients whose appointments are after yours.  Also, if you no show three or more times for appointments you may be dismissed from the clinic at the providers discretion.     Again, thank you for choosing [facility].  Our hope is that these requests will decrease the amount of time that you wait before being seen by our physicians.       _____________________________________________________________  Should you have questions after your visit to [facility], please contact our office at [phone] and follow the prompts.  Our office hours are 8:00 a.m. and 4:30 p.m. Monday - Friday.  Please note that voicemails left after 4:00 p.m. may not be returned until the following business day.  We are closed weekends and major holidays.  You do have access to a nurse 24-7, just call the main number to the clinic [phone] and do not press any options, hold on the line and a nurse will answer the phone.    For prescription refill requests, have your pharmacy contact our office and allow 72 hours.    Due to Covid, you will need to wear a mask upon entering  the hospital. If you do not have a mask, a mask will be given to you at the Main Entrance upon arrival. For doctor visits, patients may have 1 support person age 39 or older with them. For treatment visits, patients can not have anyone with them due to social distancing guidelines and our immunocompromised population.

## 2021-11-01 NOTE — Progress Notes (Signed)
[place], [place] [address]   CLINIC:  Medical Oncology/Hematology  PCP:  Janith Lima, MD [address] 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 TURP on 11/10/2019. 2. Docetaxel x 6 cycles from 11/16/2019 to 02/29/2020. 3.  Abiraterone and prednisone  NGS Results: not done  CURRENT THERAPY: Darolutamide 600 mg twice daily  BRIEF ONCOLOGIC HISTORY:  Oncology History  Prostate cancer metastatic to multiple sites [facility])  01/25/2017 Initial Diagnosis   Prostate cancer metastatic to multiple sites [facility])   11/16/2019 - 03/02/2020 Chemotherapy   Patient is on Treatment Plan : PROSTATE Docetaxel + Prednisone q21d      Genetic Testing   Negative genetic testing. No pathogenic variants identified on the [facility] CancerNext-Expanded panel. 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 The report date is 04/19/2020.  The CancerNext-Expanded  gene panel offered by [facility] and includes sequencing and rearrangement analysis for the following 77 genes: IP, ALK, APC*, ATM*, AXIN2, BAP1, BARD1, BLM, BMPR1A, BRCA1*, BRCA2*, BRIP1*, CDC73, CDH1*,CDK4, CDKN1B, CDKN2A, CHEK2*, CTNNA1, DICER1, FANCC, FH, FLCN, GALNT12, KIF1B, LZTR1, MAX, MEN1, MET, MLH1*, MSH2*, MSH3, MSH6*, MUTYH*, NBN, NF1*, NF2, NTHL1, PALB2*, PHOX2B, PMS2*, POT1, PRKAR1A, PTCH1, PTEN*, RAD51C*, RAD51D*,RB1, RECQL, RET, SDHA, SDHAF2, SDHB, SDHC, SDHD, SMAD4, SMARCA4, SMARCB1, SMARCE1, STK11, SUFU, TMEM127, TP53*,TSC1, TSC2, VHL and XRCC2 (sequencing and deletion/duplication); EGFR, EGLN1, HOXB13, KIT, MITF, PDGFRA, POLD1 and POLE (sequencing only); EPCAM and GREM1 (deletion/duplication only).      CANCER STAGING:  Cancer Staging  No matching staging information was found for the patient.  INTERVAL HISTORY:  [name], a 66 y.o. male, follow-up for metastatic prostate cancer  to the bones.  He reports that he has started darolutamide 600 mg twice daily on 10/17/2021.  He reported having more drowsiness since he started [place].  This is gradually improving.  He had to take couple of naps during the daytime.  He is taking the pills between 9 and 11 AM and 9 at 11 PM.  He also reported occasional waves of nausea but without any vomiting.  He did not have to take the Compazine.  No skin rashes reported.  He is taking tramadol for his hip pains.  He stopped taking statin which usually causes knee pains.  REVIEW OF SYSTEMS:  Review of Systems  Constitutional:  Positive for fatigue.  Gastrointestinal:  Positive for nausea.  Musculoskeletal:  Positive for arthralgias (1/10 hips and R knee) and back pain (1/10).  Neurological:  Negative for light-headedness and numbness.  All other systems reviewed and are negative.   PAST MEDICAL/SURGICAL HISTORY:  Past Medical History:  Diagnosis Date   Bladder obstruction    Complication of anesthesia    Diverticulitis 2015   Dyspnea    Essential hypertension, benign 01/26/2019   at [place] office elevated, normal at home, no medications at this time   History of basal cell cancer    HLD (hyperlipidemia) 01/26/2019   Hypothyroidism    history of, not currently taking medication   PONV (postoperative nausea and vomiting)    Prostate cancer ([place])    metastatic to bones   Vitamin D deficiency disease 01/26/2019   Past Surgical History:  Procedure Laterality Date   CYSTOSCOPY W/ URETERAL STENT PLACEMENT Left 11/10/2019   Procedure: CYSTOSCOPY;  Surgeon: Irine Seal, MD;  [facility];  Service: Urology;  Laterality: Left;  ESOPHAGOGASTRODUODENOSCOPY (EGD) WITH PROPOFOL N/A 07/07/2020   Procedure: ESOPHAGOGASTRODUODENOSCOPY (EGD) WITH PROPOFOL;  Surgeon: Daneil Dolin, MD;  [facility];  Service: Endoscopy;  Laterality: N/A;  pm appt   HERNIA REPAIR  [id number]   UMBILICAL    MALONEY DILATION N/A 07/07/2020   Procedure:  MALONEY DILATION;  Surgeon: Daneil Dolin, MD;  [facility];  Service: Endoscopy;  Laterality: N/A;   PROSTATE SURGERY     SKIN CANCER EXCISION     TONSILLECTOMY Bilateral    TOOTH EXTRACTION     front left tooth pulled but no surgery or stitches   TRANSURETHRAL RESECTION OF PROSTATE N/A 01/25/2017   Procedure: TRANSURETHRAL RESECTION OF THE PROSTATE (TURP);  Surgeon: Alexis Frock, MD;  [facility];  Service: Urology;  Laterality: N/A;   TRANSURETHRAL RESECTION OF PROSTATE N/A 11/10/2019   Procedure: TRANSURETHRAL RESECTION OF THE PROSTATE (TURP);  Surgeon: Irine Seal, MD;  [facility];  Service: Urology;  Laterality: N/A;   WISDOM TOOTH EXTRACTION      SOCIAL HISTORY:  Social History   Socioeconomic History   Marital status: Married    Spouse name: Not on file   Number of children: 1   Years of education: Not on file   Highest education level: Not on file  Occupational History    Comment: working full time  Tobacco Use   Smoking status: Former    Packs/day: 1.50    Years: 35.00    Total pack years: 52.50    Types: Cigarettes    Quit date: 01/12/2004    Years since quitting: 17.8   Smokeless tobacco: Never   Tobacco comments:    OVER 30 YEARS HX OF SMOKING   Vaping Use   Vaping Use: Never used  Substance and Sexual Activity   Alcohol use: Not Currently    Comment: heavy drinker until 2004   Drug use: Not Currently    Types: Marijuana   Sexual activity: Not Currently  Other Topics Concern   Not on file  Social History Narrative   Married for 19 years.Works for [employer] from home.   Social Determinants of Health   Financial Resource Strain: Low Risk  (03/02/2020)   Overall Financial Resource Strain (CARDIA)    Difficulty of Paying Living Expenses: Not hard at all  Food Insecurity: No Food Insecurity (03/02/2020)   Hunger Vital Sign    Worried About Running Out of Food in the Last Year: Never true    Ran Out of Food in the Last  Year: Never true  Transportation Needs: No Transportation Needs (03/02/2020)   PRAPARE - Hydrologist (Medical): No    Lack of Transportation (Non-Medical): No  Physical Activity: Inactive (03/02/2020)   Exercise Vital Sign    Days of Exercise per Week: 0 days    Minutes of Exercise per Session: 0 min  Stress: No Stress Concern Present (03/02/2020)   [place]    Feeling of Stress : Only a little  Social Connections: Moderately Integrated (03/02/2020)   Social Connection and Isolation Panel [NHANES]    Frequency of Communication with Friends and Family: Three times a week    Frequency of Social Gatherings with Friends and Family: Never    Attends Religious Services: Never    Marine scientist or Organizations: Yes    Attends Archivist Meetings: 1 to 4 times per year  Marital Status: Married  Human resources officer Violence: Not At Risk (03/02/2020)   Humiliation, Afraid, Rape, and Kick questionnaire    Fear of Current or Ex-Partner: No    Emotionally Abused: No    Physically Abused: No    Sexually Abused: No    FAMILY HISTORY:  Family History  Problem Relation Age of Onset   Emphysema Mother    Alzheimer's disease Father    Prostate cancer Father        dx in his 2s   Prostate cancer Brother        dx in his 7s   Breast cancer Maternal Grandmother        60s   Pancreatic cancer Maternal Grandfather        49s   Dementia Paternal Grandmother    Heart attack Paternal Grandfather    Colon cancer Neg Hx     CURRENT MEDICATIONS:  Current Outpatient Medications  Medication Sig Dispense Refill   arginine 500 MG tablet Take 500 mg by mouth 2 (two) times daily.     Cholecalciferol 125 MCG (5000 UT) capsule Take 5,000-10,000 Units by mouth See admin instructions. Take 10000 units in the morning and 5000 units at night     DANDELION ROOT PO Take 500 mg by mouth daily.      darolutamide (NUBEQA) 300 MG tablet Take 2 tablets (600 mg total) by mouth 2 (two) times daily with a meal. 120 tablet 3   dutasteride (AVODART) 0.5 MG capsule Take 1 capsule (0.5 mg total) by mouth daily. 90 capsule 3   estradiol (VIVELLE-DOT) 0.1 MG/24HR patch Place 1 patch (0.1 mg total) onto the skin 2 (two) times a week. 8 patch 12   ibuprofen (ADVIL) 200 MG tablet Take 400 mg by mouth every 6 (six) hours as needed for moderate pain.     Leuprolide Acetate, 6 Month, (LUPRON DEPOT, 73-MONTH,) 45 MG injection Inject 45 mg into the muscle every 6 (six) months.     MAGNESIUM PO Take 1 tablet by mouth at bedtime.     MELATONIN PO Take 40 mg by mouth at bedtime.     MILK THISTLE PO Take 1 capsule by mouth daily.     NATTOKINASE PO Take 2 capsules by mouth daily.     NP THYROID 120 MG tablet TAKE 1/2 TABLET(60 MG) BY MOUTH EVERY MORNING 45 tablet 0   olmesartan (BENICAR) 20 MG tablet TAKE 1 TABLET(20 MG) BY MOUTH DAILY 90 tablet 0   pantoprazole (PROTONIX) 40 MG tablet TAKE 1 TABLET BY MOUTH DAILY 30 tablet 5   QUERCETIN PO Take 1 tablet by mouth 2 (two) times daily.     thiamine (VITAMIN B-1) 50 MG tablet Take 1 tablet (50 mg total) by mouth daily. 90 tablet 1   traMADol (ULTRAM) 50 MG tablet TAKE 1 TABLET(50 MG) BY MOUTH AT BEDTIME AS NEEDED 30 tablet 0   VITAMIN K PO Take 1 capsule by mouth every other day.     Zinc 30 MG CAPS Take 30 mg by mouth daily.     No current facility-administered medications for this visit.    ALLERGIES:  Allergies  Allergen Reactions   Crestor [Rosuvastatin] Other (See Comments)    myalgia   Ivp Dye [Iodinated Contrast Media] Swelling    Swelling on head, took Benadryl and swelling reduced   Wellbutrin [Bupropion] Hives    PHYSICAL EXAM:  Performance status (ECOG): 0 - Asymptomatic  Vitals:   11/01/21 1106  BP: 127/85  Pulse: 60  Resp: 18  Temp: 98.3 F (36.8 C)  SpO2: 98%   Wt Readings from Last 3 Encounters:  11/01/21 197 lb 6.4 oz (89.5  kg)  10/04/21 197 lb 12.8 oz (89.7 kg)  09/07/21 194 lb (88 kg)   Physical Exam Vitals reviewed.  Constitutional:      Appearance: Normal appearance. He is obese.  Cardiovascular:     Rate and Rhythm: Normal rate and regular rhythm.     Pulses: Normal pulses.     Heart sounds: Normal heart sounds.  Pulmonary:     Effort: Pulmonary effort is normal.     Breath sounds: Normal breath sounds.  Neurological:     General: No focal deficit present.     Mental Status: He is alert and oriented to person, place, and time.  Psychiatric:        Mood and Affect: Mood normal.        Behavior: Behavior normal.      LABORATORY DATA:  I have reviewed the labs as listed.     Latest Ref Rng & Units 11/01/2021    9:45 AM 08/30/2021   12:43 PM 08/24/2021    1:36 PM  CBC  WBC 4.0 - 10.5 K/uL 4.1  6.5  6.5   Hemoglobin 13.0 - 17.0 g/dL 13.0  12.4  13.1   Hematocrit 39.0 - 52.0 % 36.2  35.6  37.9   Platelets 150 - 400 K/uL 228  210  213.0       Latest Ref Rng & Units 11/01/2021    9:45 AM 08/30/2021   12:43 PM 05/16/2021    1:04 PM  CMP  Glucose 70 - 99 mg/dL 122  102  114   BUN 8 - 23 mg/dL 18  18  15    Creatinine 0.61 - 1.24 mg/dL 1.01  0.97  0.77   Sodium 135 - 145 mmol/L 135  135  132   Potassium 3.5 - 5.1 mmol/L 3.8  4.0  3.7   Chloride 98 - 111 mmol/L 105  105  104   CO2 22 - 32 mmol/L 24  24  19    Calcium 8.9 - 10.3 mg/dL 9.3  9.1  8.7   Total Protein 6.5 - 8.1 g/dL 6.7  6.6  6.8   Total Bilirubin 0.3 - 1.2 mg/dL 0.8  1.0  1.1   Alkaline Phos 38 - 126 U/L 59  64  69   AST 15 - 41 U/L 15  14  14    ALT 0 - 44 U/L 17  12  10      DIAGNOSTIC IMAGING:  I have independently reviewed the scans and discussed with the patient. No results found.   ASSESSMENT:  1.  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  Prominent left  hydronephrosis and hydroureter noted.  New sclerotic lesion in the left anterior vertebral body of L1.  Mild left periaortic adenopathy increased from prior, short axis lymph node measuring 1 cm. -Last PSA increased to 15.8 on 09/28/2019. -As there is rapid progression of disease, I have recommended docetaxel chemotherapy.  He is agreeable after long discussion. -Cystoscopy with transurethral resection of tumor in the posterior bladder neck, right and left walls of the bladder, right lobe of the prostate on 11/10/2019. -Plan was to do 6 cycles of docetaxel followed by abiraterone  and prednisone. -6 cycles of docetaxel from 11/16/2019 through 02/29/2020. -CTAP on 01/06/2020 showed persistent thickening of the urinary bladder wall.  Left hydronephrosis is significantly reduced compared to prior exam.  Left-sided retroperitoneal and left iliac lymph nodes reduced in size.  Interval increase in sclerosis of lesions of the left aspect of L2 vertebral body consistent with treatment response.  No new bone lesions. -Bone scan on 01/06/2020 shows stable L2 metastatic focus. -Bone scan scan images from 06/14/2020 which showed progressive metastatic disease, with increased size and intensity of L2 1 new focus of activity at L1. 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 -Abiraterone and prednisone from 06/24/2020 through 09/06/2021 with progression. - PSMA PET scan (09/28/2021): Residual/recurrent tumor involving prostate gland and seminal vesicles.  Tracer avid bone metastasis localized L1 and L2 vertebral bodies.  No new sites of bone metastasis disease.  No solid organ or nodal mets.   2.  Left hydroureteronephrosis: -CT scan showed prostate mass invading the posterior bladder and obstructing the ureter.  Creatinine increased to 1.1. -Renal ultrasound on 12/11/2019 showed interval resolution of previously seen left-sided hydronephrosis.  Previously seen soft tissue mass at the posterior bladder lumen no longer  seen.   PLAN:  1.  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 - He reportedly had drowsiness when he started out which is gradually improving.  Also had occasional waves of nausea without any vomiting. - Reviewed labs today which showed normal LFTs and electrolytes.  CBC was grossly normal. - Drowsiness and nausea will likely improve over time. - He is taking milk thistle and dandelion root capsules.  I have told him that we do not have interactions studies with darolutamide.  He was recommended to stop them. - RTC 6 weeks for follow-up.  We will plan to repeat labs and PSA at that time.   2.  Hot flashes: - Continue estradiol patch 0.1 mg twice daily for hot flashes.   3.  Peripheral neuropathy: - Left foot tingling is stable.   4.  Lower back pain/bilateral hip pains: -He has bilateral hip pains and lower back pain.  Continue tramadol as needed.  5.  Hypothyroidism: - Continue NP thyroid half tablet daily.  6.  Osteoporosis (DEXA scan on 02/24/2021 T score -2.5): - Continue calcium 600 mg and vitamin D 5000 units daily.   Orders placed this encounter:  No orders of the defined types were placed in this encounter.    [name], MD [place] [phone]

## 2021-11-01 NOTE — Telephone Encounter (Signed)
Patient called advised he would like to cancel his surgery.  Patient asked for a call back to confirm the surgery was canceled.  The number to contact patient is [phone]

## 2021-11-02 NOTE — Telephone Encounter (Signed)
I spoke with patient, and advised surgery was canceled per his request.

## 2021-11-03 ENCOUNTER — Ambulatory Visit (HOSPITAL_BASED_OUTPATIENT_CLINIC_OR_DEPARTMENT_OTHER)
Admission: RE | Admit: 2021-11-03 | Payer: BC Managed Care – PPO | Source: Home / Self Care | Admitting: Orthopedic Surgery

## 2021-11-03 ENCOUNTER — Ambulatory Visit: Payer: BC Managed Care – PPO | Admitting: Orthopedic Surgery

## 2021-11-03 DIAGNOSIS — Z01818 Encounter for other preprocedural examination: Secondary | ICD-10-CM

## 2021-11-03 SURGERY — EXCISION MASS
Anesthesia: Choice | Laterality: Right

## 2021-11-04 ENCOUNTER — Other Ambulatory Visit: Payer: Self-pay | Admitting: Internal Medicine

## 2021-11-04 DIAGNOSIS — E039 Hypothyroidism, unspecified: Secondary | ICD-10-CM

## 2021-11-13 ENCOUNTER — Other Ambulatory Visit (HOSPITAL_COMMUNITY): Payer: Self-pay | Admitting: Hematology

## 2021-11-29 ENCOUNTER — Other Ambulatory Visit (HOSPITAL_COMMUNITY): Payer: Self-pay | Admitting: Hematology

## 2021-11-29 ENCOUNTER — Other Ambulatory Visit: Payer: Self-pay

## 2021-11-29 MED ORDER — ONDANSETRON 8 MG PO TBDP: 8.0000 mg | ORAL_TABLET | Freq: Three times a day (TID) | ORAL | 3 refills | Status: DC | PRN

## 2021-12-07 ENCOUNTER — Encounter: Payer: Self-pay | Admitting: Urology

## 2021-12-07 ENCOUNTER — Ambulatory Visit (INDEPENDENT_AMBULATORY_CARE_PROVIDER_SITE_OTHER): Payer: BC Managed Care – PPO | Admitting: Urology

## 2021-12-07 VITALS — BP 124/72 | HR 57

## 2021-12-07 DIAGNOSIS — C61 Malignant neoplasm of prostate: Secondary | ICD-10-CM

## 2021-12-07 DIAGNOSIS — R9721 Rising PSA following treatment for malignant neoplasm of prostate: Secondary | ICD-10-CM | POA: Diagnosis not present

## 2021-12-07 DIAGNOSIS — N403 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: Secondary | ICD-10-CM

## 2021-12-07 MED ORDER — DUTASTERIDE 0.5 MG PO CAPS: 0.5000 mg | ORAL_CAPSULE | Freq: Every day | ORAL | 3 refills | Status: DC

## 2021-12-07 MED ORDER — LEUPROLIDE ACETATE (6 MONTH) 45 MG ~~LOC~~ KIT
45.0000 mg | PACK | Freq: Once | SUBCUTANEOUS | Status: AC
  Administered 2021-12-07: 45 mg via SUBCUTANEOUS

## 2021-12-07 NOTE — Progress Notes (Signed)
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  His PSA has continued to rise and was 3.58 in 6/23 and is now 9.84.  He last saw Dr. Delton Coombes on 11/01/21 and was switched to Darolutamide from abiaterone on 10/17/21.   He remains on dutasteride and estradiol patches as well. He had a PSMA PET on 09/28/21 and was noted to have tumor of the prostate gland and SV's and mets at L1 and L2.  There was no hydro noted.   He is on calcium and Vit D.  He had some increased voiding symptoms after starting the darolutamide but that has improved.  His IPSS is 7.  He has some low back and hip pain.  He is starting to losing weight after he gained on the prednisone.   He has discussed Pluvicto and is being considered for that.     06/01/21: Scott [name] returns today for Lupron.  His PSA is slowly rising and was 1.54 earlier this month.  He has fatigue and SOB. He has no further bone pain.   He has a stable weight. He is voiding well with an IPSS of 5-6 with some nocturia 1-2x.   He has had no hematuria and the UA is clear.  He continues to use estradiol and abiaterone with pred.  His hot flashes are well controlled with the estradiol.   He is on no bone targeting agents and is reluctant to add them because of dental issues.  He has started a calcium supplement.   He saw Dr. Delton Coombes on 05/23/21.   12/01/20: Scott [name] returns today in f/u for Lupron.  He has been dealing with shingles on the left flank and upper leg for the last 3-4 weeks but that is improving.  His last PSA in 8/22 was down to 0.89.  He started Zytiga in 4/22 and had lumbar SBRT in 5/22.   He has done well with that and his bone pain has improved.  He is voiding well with an IPSS of 2-3.  His Alk phos has normalized.    06/02/20: Scott [name] returns in f/u for his history of CRCP.  His PSA has been rising despite 6 rounds of chemo.   He is seeing Dr. Anastasio Champion and is on oral estradiol 68m po bid but the hot flashes have returned.   He  remains on dutasteride and Lupron and his T is  <3 and PSA is up to 12.9 on 05/26/20 from 4.77 on 04/28/20.  He is voiding well and his Cr was 0.8 on 2/10.  He has back pain that improved with chemo and prednisone but it has recurred.   His bone scan on 01/14/20 demonstrated stable L2 mets.  The CT showed improved left hydro with minimal residual bilateral hydro.  He is due for Lupron.  He is voiding well.  His IPSS is 6.  He has a tooth that needs extraction.     12/04/19: [name] in f/u from a TURP on 11/10/19 with resection of both UO's for malignant obstruction.   He is voiding better but has some frequency and nocturia 2-4x.  He has some left flank pain with voiding and probably has reflux.   His IPSS is 14.   He has had some hematuria but is improving.   He has some bone pain with the fulphila given for bone marrow support.   He is otherwise tolerating chemo after one dose.  His Cr is down to  0.9 from 1.1 prior to the resection.   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 CT with bilateral non-bulky iliac adenopathy and tiny uptake Lt orbit (no spine mets) by bone scan.  His PSA is up to 15.5 from  9.47 at last check and a doubling over the last year. The testosterone remains castrate at <3 on Lupron and dutasteride. He remains on estradiol 0.66m patches for the hot flashes.   He had a telehealth visit with Dr. MTammi Klippelfor consideration of radiation therapy to the primary and pelvic nodes from the [place] findings on 12/11/18 but decided against that.  He is seeing Dr. KDelton Coombesas well but is  reluctant to consider Zytiga because of the prednisone.   He has had no recent hematuria.  He has occasional frequency with urgency and UUI.  He wears a pad. He has a reduced stream.  He has nocturia 3-4x but he is drinking a lot of fluids.   His IPSS is  29-30.   Present Management:  01/2017 begin Lupron androgen deprivation Q618modeclined reccomended orchiectomy)  04/2017 - PSA 2.86;  07/2017 PSA/T 3.44 / T 3 ==> Lupron 4593m 10/08/17 PSA 2.1.  02/07/18 PSA 2.19/T 4. added Estradiol 0.1mg32mtches twice weekly for the hot flashes.  03/10/18 PSA 1.87/T<3.  05/09/18 PSA 2.09/T10/ Est 32.  07/24/18 PSA 6/T 54  08/08/18 Lupron 45mg86m/22/20 PSA 4.37  10/22/18 PSA 4.64/T<3.  11/26/18 Bone scan negative/ CT C/A/P negative. 12/11/18 Axumin PET small equivocal periaortic and left ext iliac nodes and right prostate activity.  I have discussed that with him.  11/28/18 PSA 6.46. 02/10/19 PSA 7.31/T <3. 03/19/19 PSA 8.20 04/11/19  Lupron 45mg 51mn.  04/23/19 PSA 9.96. 06/23/19 PSA 9.47. 09/28/19 PSA 15.56, T <3.  10/09/19 Lupron 45mg g74m 11/10/19  Operative cysto / TURP 2018 with inviltrative bladder neck / prostate mass ( left UO purposefully resected).  11/16/19 PSA 20.69.   11/06/19 [name] at the base of the bladder consistent with local invasion from his CRCP and he has left ureteral obstruction with a minor increase in his Cr.  He has seen Dr. KatragaDelton Coombes scheduled to begin chemotherapy but it was felt that decompression of the left kidney was indicated.   He is have increased voiding difficulty with passage of clots as well.    Results for Scott [name], Scott [name], [id number] 07/10/2019 08:56  Ref. Range 11/28/2018 10:23 02/10/2019 13:25 03/19/2019 13:49 04/23/2019 13:58 06/23/2019 13:41  Prostatic Specific Antigen Latest Ref Range: 0.00 - 4.00 ng/mL 6.46 (H) 7.31 (H) 8.20 (H) 9.96 (H) 9.47 (H)      IPSS     Row Name 12/07/21 1300         International Prostate Symptom Score   How often have you had the sensation of not emptying your bladder? Not at All     How often have you had to urinate less than every two hours? Less than 1 in 5 times     How often have you found you stopped and started again several times when you urinated? Not at  All     How often have you found it difficult to postpone urination? About half the time     How often have you had a weak urinary stream? Less  than 1 in 5 times     How often have you had to strain to start urination? Not at All     How many times did you typically get up at night to urinate? 2 Times     Total IPSS Score 7       Quality of Life due to urinary symptoms   If you were to spend the rest of your life with your urinary condition just the way it is now how would you feel about that? Pleased              ROS:  ROS:  A complete review of systems was performed.  All systems are negative except for pertinent findings as noted.   Review of Systems  Constitutional:  Positive for malaise/fatigue.  Respiratory:  Positive for shortness of breath.   Musculoskeletal:  Positive for back pain and joint pain.  Neurological:  Positive for weakness.    Allergies  Allergen Reactions   Crestor [Rosuvastatin] Other (See Comments)    myalgia   Ivp Dye [Iodinated Contrast Media] Swelling    Swelling on head, took Benadryl and swelling reduced   Wellbutrin [Bupropion] Hives    Outpatient Encounter Medications as of 12/07/2021  Medication Sig   arginine 500 MG tablet Take 500 mg by mouth 2 (two) times daily.   Cholecalciferol 125 MCG (5000 UT) capsule Take 5,000-10,000 Units by mouth See admin instructions. Take 10000 units in the morning and 5000 units at night   DANDELION ROOT PO Take 500 mg by mouth daily.   darolutamide (NUBEQA) 300 MG tablet Take 2 tablets (600 mg total) by mouth 2 (two) times daily with a meal.   dutasteride (AVODART) 0.5 MG capsule Take 1 capsule (0.5 mg total) by mouth daily.   estradiol (VIVELLE-DOT) 0.1 MG/24HR patch APPLY 1 PATCH(0.1 MG) TOPICALLY TO THE SKIN 2 TIMES A WEEK   ibuprofen (ADVIL) 200 MG tablet Take 400 mg by mouth every 6 (six) hours as needed for moderate pain.   MAGNESIUM PO Take 1 tablet by mouth at bedtime.   MELATONIN PO Take 40 mg by  mouth at bedtime.   MILK THISTLE PO Take 1 capsule by mouth daily.   NATTOKINASE PO Take 2 capsules by mouth daily.   NP THYROID 120 MG tablet TAKE 1/2 TABLET(60 MG) BY MOUTH EVERY MORNING   olmesartan (BENICAR) 20 MG tablet TAKE 1 TABLET(20 MG) BY MOUTH DAILY   ondansetron (ZOFRAN ODT) 8 MG disintegrating tablet Take 1 tablet (8 mg total) by mouth every 8 (eight) hours as needed for nausea or vomiting.   QUERCETIN PO Take 1 tablet by mouth 2 (two) times daily.   thiamine (VITAMIN B-1) 50 MG tablet Take 1 tablet (50 mg total) by mouth daily.   traMADol (ULTRAM) 50 MG tablet TAKE 1 TABLET(50 MG) BY MOUTH AT BEDTIME AS NEEDED   VITAMIN K PO Take 1 capsule by mouth every other day.   Zinc 30 MG CAPS Take 30 mg by mouth daily.   [DISCONTINUED] dutasteride (AVODART) 0.5 MG capsule Take 1 capsule (0.5 mg total) by mouth daily.   [DISCONTINUED] Leuprolide Acetate, 6 Month, (LUPRON DEPOT, 5-MONTH,) 45 MG injection Inject 45 mg into the muscle every 6 (six) months.   [DISCONTINUED] pantoprazole (PROTONIX) 40 MG tablet TAKE 1 TABLET BY MOUTH DAILY   [EXPIRED] leuprolide (6 Month) (ELIGARD) injection 45 mg    No facility-administered encounter medications on file as of 12/07/2021.    Past Medical History:  Diagnosis Date   Bladder obstruction    Complication of anesthesia    Diverticulitis 2015   Dyspnea    Essential hypertension, benign 01/26/2019   at [place] office elevated, normal at home, no medications at this time   History of basal cell cancer    HLD (hyperlipidemia) 01/26/2019   Hypothyroidism    history of, not currently taking medication   PONV (postoperative nausea and vomiting)    Prostate cancer ([place])    metastatic to bones   Vitamin D deficiency disease 01/26/2019    Past Surgical History:  Procedure Laterality Date   CYSTOSCOPY W/ URETERAL STENT PLACEMENT Left 11/10/2019   Procedure: CYSTOSCOPY;  Surgeon: Irine Seal, MD;  Location: [facility];  Service: Urology;  Laterality:  Left;   ESOPHAGOGASTRODUODENOSCOPY (EGD) WITH PROPOFOL N/A 07/07/2020   Procedure: ESOPHAGOGASTRODUODENOSCOPY (EGD) WITH PROPOFOL;  Surgeon: Daneil Dolin, MD;  Location[facility];  Service: Endoscopy;  Laterality: N/A;  pm appt   HERNIA REPAIR  2233   UMBILICAL    MALONEY DILATION N/A 07/07/2020   Procedure: MALONEY DILATION;  Surgeon: Daneil Dolin, MD;  Location[facility];  Service: Endoscopy;  Laterality: N/A;   PROSTATE SURGERY     SKIN CANCER EXCISION     TONSILLECTOMY Bilateral    TOOTH EXTRACTION     front left tooth pulled but no surgery or stitches   TRANSURETHRAL RESECTION OF PROSTATE N/A 01/25/2017   Procedure: TRANSURETHRAL RESECTION OF THE PROSTATE (TURP);  Surgeon: Alexis Frock, MD;  Location: [facility];  Service: Urology;  Laterality: N/A;   TRANSURETHRAL RESECTION OF PROSTATE N/A 11/10/2019   Procedure: TRANSURETHRAL RESECTION OF THE PROSTATE (TURP);  Surgeon: Irine Seal, MD;  Location: [facility];  Service: Urology;  Laterality: N/A;   WISDOM TOOTH EXTRACTION      Social History   Socioeconomic History   Marital status: Married    Spouse name: Not on file   Number of children: 1   Years of education: Not on file   Highest education level: Not on file  Occupational History    Comment: working full time  Tobacco Use   Smoking status: Former    Packs/day: 1.50    Years: 35.00    Total pack years: 52.50    Types: Cigarettes    Quit date: 01/12/2004    Years since quitting: 17.9   Smokeless tobacco: Never   Tobacco comments:    OVER 30 YEARS HX OF SMOKING   Vaping Use   Vaping Use: Never used  Substance and Sexual Activity   Alcohol use: Not Currently    Comment: heavy drinker until 2004   Drug use: Not Currently    Types: Marijuana   Sexual activity: Not Currently  Other Topics Concern   Not on file  Social History Narrative   Married for 19 years.Works for [employer] from home.   Social Determinants of Health   Financial Resource  Strain: Low Risk  (03/02/2020)   Overall Financial Resource Strain (CARDIA)    Difficulty of Paying Living Expenses: Not hard at all  Food Insecurity: No Food Insecurity (03/02/2020)   Hunger Vital Sign    Worried About Running Out of Food in the Last Year: Never true    Ran Out of Food in the Last Year: Never true  Transportation Needs: No Transportation Needs (03/02/2020)   PRAPARE - Hydrologist (Medical): No    Lack of Transportation (Non-Medical): No  Physical  Activity: Inactive (03/02/2020)   Exercise Vital Sign    Days of Exercise per Week: 0 days    Minutes of Exercise per Session: 0 min  Stress: No Stress Concern Present (03/02/2020)   [place]    Feeling of Stress : Only a little  Social Connections: Moderately Integrated (03/02/2020)   Social Connection and Isolation Panel [NHANES]    Frequency of Communication with Friends and Family: Three times a week    Frequency of Social Gatherings with Friends and Family: Never    Attends Religious Services: Never    Marine scientist or Organizations: Yes    Attends Archivist Meetings: 1 to 4 times per year    Marital Status: Married  Human resources officer Violence: Not At Risk (03/02/2020)   Humiliation, Afraid, Rape, and Kick questionnaire    Fear of Current or Ex-Partner: No    Emotionally Abused: No    Physically Abused: No    Sexually Abused: No    Family History  Problem Relation Age of Onset   Emphysema Mother    Alzheimer's disease Father    Prostate cancer Father        dx in his 106s   Prostate cancer Brother        dx in his 47s   Breast cancer Maternal Grandmother        60s   Pancreatic cancer Maternal Grandfather        53s   Dementia Paternal Grandmother    Heart attack Paternal Grandfather    Colon cancer Neg Hx        Objective: Vitals:   12/07/21 1318  BP: 124/72  Pulse: (!) 57      Physical ExamI  Recent Results (from the past 2160 hour(s))  CBC with Differential     Status: Abnormal   Collection Time: 11/01/21  9:45 AM  Result Value Ref Range   WBC 4.1 4.0 - 10.5 K/uL   RBC 4.06 (L) 4.22 - 5.81 MIL/uL   Hemoglobin 13.0 13.0 - 17.0 g/dL   HCT 36.2 (L) 39.0 - 52.0 %   MCV 89.2 80.0 - 100.0 fL   MCH 32.0 26.0 - 34.0 pg   MCHC 35.9 30.0 - 36.0 g/dL   RDW 11.8 11.5 - 15.5 %   Platelets 228 150 - 400 K/uL   nRBC 0.0 0.0 - 0.2 %   Neutrophils Relative % 56 %   Neutro Abs 2.3 1.7 - 7.7 K/uL   Lymphocytes Relative 26 %   Lymphs Abs 1.1 0.7 - 4.0 K/uL   Monocytes Relative 12 %   Monocytes Absolute 0.5 0.1 - 1.0 K/uL   Eosinophils Relative 4 %   Eosinophils Absolute 0.2 0.0 - 0.5 K/uL   Basophils Relative 2 %   Basophils Absolute 0.1 0.0 - 0.1 K/uL   Immature Granulocytes 0 %   Abs Immature Granulocytes 0.01 0.00 - 0.07 K/uL    Comment: Performed at [facility], [address] [place], [place] [address]  Comprehensive metabolic panel     Status: Abnormal   Collection Time: 11/01/21  9:45 AM  Result Value Ref Range   Sodium 135 135 - 145 mmol/L   Potassium 3.8 3.5 - 5.1 mmol/L   Chloride 105 98 - 111 mmol/L   CO2 24 22 - 32 mmol/L   Glucose, Bld 122 (H) 70 - 99 mg/dL    Comment: Glucose reference range applies only to samples  taken after fasting for at least 8 hours.   BUN 18 8 - 23 mg/dL   Creatinine, Ser 1.01 0.61 - 1.24 mg/dL   Calcium 9.3 8.9 - 10.3 mg/dL   Total Protein 6.7 6.5 - 8.1 g/dL   Albumin 3.9 3.5 - 5.0 g/dL   AST 15 15 - 41 U/L   ALT 17 0 - 44 U/L   Alkaline Phosphatase 59 38 - 126 U/L   Total Bilirubin 0.8 0.3 - 1.2 mg/dL   GFR, Estimated >60 >60 mL/min    Comment: (NOTE) Calculated using the CKD-EPI Creatinine Equation (2021)    Anion gap 6 5 - 15    Comment: Performed at [facility], [address], [place], [place] [address]  Magnesium     Status: None   Collection Time: 11/01/21  9:45 AM  Result Value Ref Range    Magnesium 2.3 1.7 - 2.4 mg/dL    Comment: Performed at [facility], [address] [place], [place] [address]  PSA     Status: Abnormal   Collection Time: 11/01/21  9:46 AM  Result Value Ref Range   Prostatic Specific Antigen 9.84 (H) 0.00 - 4.00 ng/mL    Comment: (NOTE) While PSA levels of <=4.0 ng/ml are reported as reference range, some men with levels below 4.0 ng/ml can have prostate cancer and many men with PSA above 4.0 ng/ml do not have prostate cancer.  Other tests such as free PSA, age specific reference ranges, PSA velocity and PSA doubling time may be helpful especially in men less than 14 years old. Performed at [facility], [place] [address], [place], [place] [address]    I reviewed the records from his visits with Dr. Delton Coombes and his CT report.   Assessment & Plan: He has CRCP with locally advanced disease with a history of  left ureteral obstruction with AKI and he also has bone and lymph node metastases.   He is doing well with the addition of zytiga and SBRT with a decline in the PSA to 0.89.  He was given leuprolide today.  He inquired about high dose estradiol but I am reluctant to prescribe that at this time.  He is on the estradiol patch which is helping his ADT side effects.   Nodular prostate with obstruction.  He continues to do well following the TURP.     History of Left ureteral obstruction.   He has no left flank pain.   Continue Luprolide 82m q683mond dutasteride.   The estradiol is managed by Dr. KaDelton Coombes  Meds ordered this encounter  Medications   dutasteride (AVODART) 0.5 MG capsule    Sig: Take 1 capsule (0.5 mg total) by mouth daily.    Dispense:  90 capsule    Refill:  3   leuprolide (6 Month) (ELIGARD) injection 45 mg     No orders of the defined types were placed in this encounter.     Return in about 6 months (around 06/07/2022) for for Eligard. .   CC: JoJanith LimaMD, Dr. SrDerek Jack    JoIrine Seal/22/2023 Patient ID: WiTrevor Macemale   DOB: [id number].   MRN: [id number]

## 2021-12-07 NOTE — Progress Notes (Signed)
Eligard SubQ Injection  ? ?Due to Prostate Cancer patient is present today for a Eligard Injection. ? ?Medication: Eligard 6 month ?Dose: 45 mg  ?Location: right  ? ? ?Patient tolerated well, no complications were noted ? ?Performed by: Huey Scalia LPN ? ?

## 2021-12-08 ENCOUNTER — Encounter: Payer: Self-pay | Admitting: Urology

## 2021-12-13 ENCOUNTER — Inpatient Hospital Stay: Payer: BC Managed Care – PPO | Attending: Hematology

## 2021-12-13 DIAGNOSIS — C779 Secondary and unspecified malignant neoplasm of lymph node, unspecified: Secondary | ICD-10-CM | POA: Diagnosis not present

## 2021-12-13 DIAGNOSIS — C61 Malignant neoplasm of prostate: Secondary | ICD-10-CM | POA: Insufficient documentation

## 2021-12-13 DIAGNOSIS — C7951 Secondary malignant neoplasm of bone: Secondary | ICD-10-CM | POA: Diagnosis not present

## 2021-12-13 LAB — CBC WITH DIFFERENTIAL/PLATELET
Abs Immature Granulocytes: 0.02 10*3/uL (ref 0.00–0.07)
Basophils Absolute: 0.1 10*3/uL (ref 0.0–0.1)
Basophils Relative: 1 %
Eosinophils Absolute: 0.1 10*3/uL (ref 0.0–0.5)
Eosinophils Relative: 2 %
HCT: 38.1 % — ABNORMAL LOW (ref 39.0–52.0)
Hemoglobin: 13.4 g/dL (ref 13.0–17.0)
Immature Granulocytes: 0 %
Lymphocytes Relative: 23 %
Lymphs Abs: 1.1 10*3/uL (ref 0.7–4.0)
MCH: 32.1 pg (ref 26.0–34.0)
MCHC: 35.2 g/dL (ref 30.0–36.0)
MCV: 91.1 fL (ref 80.0–100.0)
Monocytes Absolute: 0.5 10*3/uL (ref 0.1–1.0)
Monocytes Relative: 11 %
Neutro Abs: 3 10*3/uL (ref 1.7–7.7)
Neutrophils Relative %: 63 %
Platelets: 219 10*3/uL (ref 150–400)
RBC: 4.18 MIL/uL — ABNORMAL LOW (ref 4.22–5.81)
RDW: 12 % (ref 11.5–15.5)
WBC: 4.7 10*3/uL (ref 4.0–10.5)
nRBC: 0 % (ref 0.0–0.2)

## 2021-12-13 LAB — COMPREHENSIVE METABOLIC PANEL
ALT: 18 U/L (ref 0–44)
AST: 18 U/L (ref 15–41)
Albumin: 4.4 g/dL (ref 3.5–5.0)
Alkaline Phosphatase: 64 U/L (ref 38–126)
Anion gap: 9 (ref 5–15)
BUN: 11 mg/dL (ref 8–23)
CO2: 22 mmol/L (ref 22–32)
Calcium: 9.2 mg/dL (ref 8.9–10.3)
Chloride: 102 mmol/L (ref 98–111)
Creatinine, Ser: 0.98 mg/dL (ref 0.61–1.24)
GFR, Estimated: >60 mL/min (ref >60)
Glucose, Bld: 122 mg/dL — ABNORMAL HIGH (ref 70–99)
Potassium: 4.5 mmol/L (ref 3.5–5.1)
Sodium: 133 mmol/L — ABNORMAL LOW (ref 135–145)
Total Bilirubin: 1.5 mg/dL — ABNORMAL HIGH (ref 0.3–1.2)
Total Protein: 7.1 g/dL (ref 6.5–8.1)

## 2021-12-13 LAB — MAGNESIUM: Magnesium: 2.1 mg/dL (ref 1.7–2.4)

## 2021-12-20 ENCOUNTER — Inpatient Hospital Stay: Payer: BC Managed Care – PPO | Attending: Hematology | Admitting: Hematology

## 2021-12-20 ENCOUNTER — Inpatient Hospital Stay: Payer: BC Managed Care – PPO

## 2021-12-20 ENCOUNTER — Encounter: Payer: Self-pay | Admitting: Hematology

## 2021-12-20 VITALS — BP 138/79 | HR 55 | Temp 99.1°F | Resp 18 | Wt 183.4 lb

## 2021-12-20 DIAGNOSIS — G629 Polyneuropathy, unspecified: Secondary | ICD-10-CM | POA: Diagnosis not present

## 2021-12-20 DIAGNOSIS — E785 Hyperlipidemia, unspecified: Secondary | ICD-10-CM | POA: Insufficient documentation

## 2021-12-20 DIAGNOSIS — Z803 Family history of malignant neoplasm of breast: Secondary | ICD-10-CM | POA: Insufficient documentation

## 2021-12-20 DIAGNOSIS — I10 Essential (primary) hypertension: Secondary | ICD-10-CM | POA: Insufficient documentation

## 2021-12-20 DIAGNOSIS — E559 Vitamin D deficiency, unspecified: Secondary | ICD-10-CM | POA: Diagnosis not present

## 2021-12-20 DIAGNOSIS — M545 Low back pain, unspecified: Secondary | ICD-10-CM | POA: Diagnosis not present

## 2021-12-20 DIAGNOSIS — Z9221 Personal history of antineoplastic chemotherapy: Secondary | ICD-10-CM | POA: Insufficient documentation

## 2021-12-20 DIAGNOSIS — Z192 Hormone resistant malignancy status: Secondary | ICD-10-CM | POA: Diagnosis not present

## 2021-12-20 DIAGNOSIS — N1330 Unspecified hydronephrosis: Secondary | ICD-10-CM | POA: Insufficient documentation

## 2021-12-20 DIAGNOSIS — C61 Malignant neoplasm of prostate: Secondary | ICD-10-CM | POA: Insufficient documentation

## 2021-12-20 DIAGNOSIS — M810 Age-related osteoporosis without current pathological fracture: Secondary | ICD-10-CM | POA: Insufficient documentation

## 2021-12-20 DIAGNOSIS — Z85828 Personal history of other malignant neoplasm of skin: Secondary | ICD-10-CM | POA: Diagnosis not present

## 2021-12-20 DIAGNOSIS — Z87891 Personal history of nicotine dependence: Secondary | ICD-10-CM | POA: Diagnosis not present

## 2021-12-20 DIAGNOSIS — R112 Nausea with vomiting, unspecified: Secondary | ICD-10-CM | POA: Diagnosis not present

## 2021-12-20 DIAGNOSIS — E039 Hypothyroidism, unspecified: Secondary | ICD-10-CM | POA: Insufficient documentation

## 2021-12-20 DIAGNOSIS — Z800 Family history of malignant neoplasm of digestive organs: Secondary | ICD-10-CM | POA: Diagnosis not present

## 2021-12-20 DIAGNOSIS — C7951 Secondary malignant neoplasm of bone: Secondary | ICD-10-CM | POA: Insufficient documentation

## 2021-12-20 DIAGNOSIS — Z79899 Other long term (current) drug therapy: Secondary | ICD-10-CM | POA: Insufficient documentation

## 2021-12-20 LAB — PSA: Prostatic Specific Antigen: 10.63 ng/mL — ABNORMAL HIGH (ref 0.00–4.00)

## 2021-12-20 NOTE — Patient Instructions (Addendum)
[facility]  Discharge Instructions  You were seen and examined today by Dr. Delton Coombes.  Dr. Delton Coombes discussed your most recent lab work which revealed everything looks good and stable. We are waiting on PSA results since they were drawn today.  Continue Darolutamide as prescribed.  We will call with follow up after we have the results of the PSA.    Thank you for choosing [facility] to provide your oncology and hematology care.   To afford each patient quality time with our provider, please arrive at least 15 minutes before your scheduled appointment time. You may need to reschedule your appointment if you arrive late (10 or more minutes). Arriving late affects you and other patients whose appointments are after yours.  Also, if you miss three or more appointments without notifying the office, you may be dismissed from the clinic at the provider's discretion.    Again, thank you for choosing [facility].  Our hope is that these requests will decrease the amount of time that you wait before being seen by our physicians.   If you have a lab appointment with the [facility] please come in thru the Main Entrance and check in at the main information desk.           _____________________________________________________________  Should you have questions after your visit to [facility], please contact our office at [phone] and follow the prompts.  Our office hours are 8:00 a.m. to 4:30 p.m. Monday - Thursday and 8:00 a.m. to 2:30 p.m. Friday.  Please note that voicemails left after 4:00 p.m. may not be returned until the following business day.  We are closed weekends and all major holidays.  You do have access to a nurse 24-7, just call the main number to the clinic [phone] and do not press any options, hold on the line and a nurse will answer the phone.    For prescription refill requests, have  your pharmacy contact our office and allow 72 hours.    Masks are optional in the cancer centers. If you would like for your care team to wear a mask while they are taking care of you, please let them know. You may have one support person who is at least 64 years old accompany you for your appointments.

## 2021-12-20 NOTE — Progress Notes (Unsigned)
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 TURP on 11/10/2019. 2. Docetaxel x 6 cycles from 11/16/2019 to 02/29/2020. 3.  Abiraterone and prednisone  NGS Results: not done  CURRENT THERAPY: Darolutamide 600 mg twice daily  BRIEF ONCOLOGIC HISTORY:  Oncology History  Prostate cancer metastatic to multiple sites [facility])  01/25/2017 Initial Diagnosis   Prostate cancer metastatic to multiple sites [facility])   11/16/2019 - 03/02/2020 Chemotherapy   Patient is on Treatment Plan : PROSTATE Docetaxel + Prednisone q21d      Genetic Testing   Negative genetic testing. No pathogenic variants identified on the [facility] CancerNext-Expanded panel. 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 The report date is 04/19/2020.  The CancerNext-Expanded  gene panel offered by [facility] and includes sequencing and rearrangement analysis for the following 77 genes: IP, ALK, APC*, ATM*, AXIN2, BAP1, BARD1, BLM, BMPR1A, BRCA1*, BRCA2*, BRIP1*, CDC73, CDH1*,CDK4, CDKN1B, CDKN2A, CHEK2*, CTNNA1, DICER1, FANCC, FH, FLCN, GALNT12, KIF1B, LZTR1, MAX, MEN1, MET, MLH1*, MSH2*, MSH3, MSH6*, MUTYH*, NBN, NF1*, NF2, NTHL1, PALB2*, PHOX2B, PMS2*, POT1, PRKAR1A, PTCH1, PTEN*, RAD51C*, RAD51D*,RB1, RECQL, RET, SDHA, SDHAF2, SDHB, SDHC, SDHD, SMAD4, SMARCA4, SMARCB1, SMARCE1, STK11, SUFU, TMEM127, TP53*,TSC1, TSC2, VHL and XRCC2 (sequencing and deletion/duplication); EGFR, EGLN1, HOXB13, KIT, MITF, PDGFRA, POLD1 and POLE (sequencing only); EPCAM and GREM1 (deletion/duplication only).      CANCER STAGING:  Cancer Staging  No matching staging information was found for the patient.  INTERVAL HISTORY:  [name], a 66 y.o. male, seen for follow-up of metastatic  prostate cancer to the bones.  He is taking darolutamide 600 mg twice daily very well.  Since last visit he reports that his hip and lower back pain is getting more frequent and often worse at nighttime.  He is taking tramadol most nights.  He is trying to lose weight.  Energy levels are low and have been stable.  He had nausea intermittently and vomited couple of times.  Compazine helps with nausea and vomiting.  REVIEW OF SYSTEMS:  Review of Systems  Constitutional:  Positive for fatigue.  Gastrointestinal:  Positive for nausea.  Musculoskeletal:  Positive for arthralgias (2/10 hips) and back pain (2/10).  Neurological:  Negative for light-headedness and numbness.  All other systems reviewed and are negative.   PAST MEDICAL/SURGICAL HISTORY:  Past Medical History:  Diagnosis Date   Bladder obstruction    Complication of anesthesia    Diverticulitis 2015   Dyspnea    Essential hypertension, benign 01/26/2019   at [place] office elevated, normal at home, no medications at this time   History of basal cell cancer    HLD (hyperlipidemia) 01/26/2019   Hypothyroidism    history of, not currently taking medication   PONV (postoperative nausea and vomiting)    Prostate cancer ([place])    metastatic to bones   Vitamin D deficiency disease 01/26/2019   Past Surgical History:  Procedure Laterality Date   CYSTOSCOPY W/ URETERAL STENT PLACEMENT Left 11/10/2019   Procedure: CYSTOSCOPY;  Surgeon: Irine Seal, MD;  Location: [facility];  Service: Urology;  Laterality: Left;   ESOPHAGOGASTRODUODENOSCOPY (EGD) WITH PROPOFOL N/A 07/07/2020   Procedure: ESOPHAGOGASTRODUODENOSCOPY (EGD) WITH PROPOFOL;  Surgeon: Daneil Dolin, MD;  Location: [facility];  Service: Endoscopy;  Laterality: N/A;  pm appt   HERNIA REPAIR  [id number]   UMBILICAL    MALONEY DILATION N/A 07/07/2020   Procedure: MALONEY DILATION;  Surgeon: Daneil Dolin, MD;  Location: [facility];  Service: Endoscopy;  Laterality: N/A;   PROSTATE  SURGERY     SKIN CANCER EXCISION     TONSILLECTOMY Bilateral    TOOTH EXTRACTION     front left tooth pulled but no surgery or stitches   TRANSURETHRAL RESECTION OF PROSTATE N/A 01/25/2017   Procedure: TRANSURETHRAL RESECTION OF THE PROSTATE (TURP);  Surgeon: Alexis Frock, MD;  Location: [facility];  Service: Urology;  Laterality: N/A;   TRANSURETHRAL RESECTION OF PROSTATE N/A 11/10/2019   Procedure: TRANSURETHRAL RESECTION OF THE PROSTATE (TURP);  Surgeon: Irine Seal, MD;  Location: [facility];  Service: Urology;  Laterality: N/A;   WISDOM TOOTH EXTRACTION      SOCIAL HISTORY:  Social History   Socioeconomic History   Marital status: Married    Spouse name: Not on file   Number of children: 1   Years of education: Not on file   Highest education level: Not on file  Occupational History    Comment: working full time  Tobacco Use   Smoking status: Former    Packs/day: 1.50    Years: 35.00    Total pack years: 52.50    Types: Cigarettes    Quit date: 01/12/2004    Years since quitting: 17.9   Smokeless tobacco: Never   Tobacco comments:    OVER 30 YEARS HX OF SMOKING   Vaping Use   Vaping Use: Never used  Substance and Sexual Activity   Alcohol use: Not Currently    Comment: heavy drinker until 2004   Drug use: Not Currently    Types: Marijuana   Sexual activity: Not Currently  Other Topics Concern   Not on file  Social History Narrative   Married for 19 years.Works for [employer] from home.   Social Determinants of Health   Financial Resource Strain: Low Risk  (03/02/2020)   Overall Financial Resource Strain (CARDIA)    Difficulty of Paying Living Expenses: Not hard at all  Food Insecurity: No Food Insecurity (03/02/2020)   Hunger Vital Sign    Worried About Running Out of Food in the Last Year: Never true    Ran Out of Food in the Last Year: Never true  Transportation Needs: No Transportation Needs (03/02/2020)   PRAPARE - Radiographer, therapeutic (Medical): No    Lack of Transportation (Non-Medical): No  Physical Activity: Inactive (03/02/2020)   Exercise Vital Sign    Days of Exercise per Week: 0 days    Minutes of Exercise per Session: 0 min  Stress: No Stress Concern Present (03/02/2020)   [place]    Feeling of Stress : Only a little  Social Connections: Moderately Integrated (03/02/2020)   Social Connection and Isolation Panel [NHANES]    Frequency of Communication with Friends and Family: Three times a week    Frequency of Social Gatherings with Friends and Family: Never    Attends Religious Services: Never    Marine scientist or Organizations: Yes    Attends Archivist Meetings: 1 to 4 times per year    Marital Status: Married  Human resources officer Violence: Not At Risk (03/02/2020)   Humiliation, Afraid, Rape, and Kick questionnaire    Fear of Current or Ex-Partner:  No    Emotionally Abused: No    Physically Abused: No    Sexually Abused: No    FAMILY HISTORY:  Family History  Problem Relation Age of Onset   Emphysema Mother    Alzheimer's disease Father    Prostate cancer Father        dx in his 50s   Prostate cancer Brother        dx in his 33s   Breast cancer Maternal Grandmother        57s   Pancreatic cancer Maternal Grandfather        74s   Dementia Paternal Grandmother    Heart attack Paternal Grandfather    Colon cancer Neg Hx     CURRENT MEDICATIONS:  Current Outpatient Medications  Medication Sig Dispense Refill   arginine 500 MG tablet Take 500 mg by mouth 2 (two) times daily.     Cholecalciferol 125 MCG (5000 UT) capsule Take 5,000-10,000 Units by mouth See admin instructions. Take 10000 units in the morning and 5000 units at night     DANDELION ROOT PO Take 500 mg by mouth daily.     darolutamide (NUBEQA) 300 MG tablet Take 2 tablets (600 mg total) by mouth 2 (two) times daily with a meal. 120  tablet 3   dutasteride (AVODART) 0.5 MG capsule Take 1 capsule (0.5 mg total) by mouth daily. 90 capsule 3   estradiol (VIVELLE-DOT) 0.1 MG/24HR patch APPLY 1 PATCH(0.1 MG) TOPICALLY TO THE SKIN 2 TIMES A WEEK 8 patch 12   ibuprofen (ADVIL) 200 MG tablet Take 400 mg by mouth every 6 (six) hours as needed for moderate pain.     MAGNESIUM PO Take 1 tablet by mouth at bedtime.     MELATONIN PO Take 40 mg by mouth at bedtime.     MILK THISTLE PO Take 1 capsule by mouth daily.     NATTOKINASE PO Take 2 capsules by mouth daily.     NP THYROID 120 MG tablet TAKE 1/2 TABLET(60 MG) BY MOUTH EVERY MORNING 45 tablet 0   olmesartan (BENICAR) 20 MG tablet TAKE 1 TABLET(20 MG) BY MOUTH DAILY 90 tablet 0   ondansetron (ZOFRAN ODT) 8 MG disintegrating tablet Take 1 tablet (8 mg total) by mouth every 8 (eight) hours as needed for nausea or vomiting. 60 tablet 3   QUERCETIN PO Take 1 tablet by mouth 2 (two) times daily.     thiamine (VITAMIN B-1) 50 MG tablet Take 1 tablet (50 mg total) by mouth daily. 90 tablet 1   traMADol (ULTRAM) 50 MG tablet TAKE 1 TABLET(50 MG) BY MOUTH AT BEDTIME AS NEEDED 30 tablet 0   VITAMIN K PO Take 1 capsule by mouth every other day.     Zinc 30 MG CAPS Take 30 mg by mouth daily.     No current facility-administered medications for this visit.    ALLERGIES:  Allergies  Allergen Reactions   Crestor [Rosuvastatin] Other (See Comments)    myalgia   Ivp Dye [Iodinated Contrast Media] Swelling    Swelling on head, took Benadryl and swelling reduced   Wellbutrin [Bupropion] Hives    PHYSICAL EXAM:  Performance status (ECOG): 0 - Asymptomatic  Vitals:   12/20/21 1545  BP: 138/79  Pulse: (!) 55  Resp: 18  Temp: 99.1 F (37.3 C)  SpO2: 99%   Wt Readings from Last 3 Encounters:  12/20/21 183 lb 6.4 oz (83.2 kg)  11/01/21 197 lb 6.4 oz (89.5  kg)  10/04/21 197 lb 12.8 oz (89.7 kg)   Physical Exam Vitals reviewed.  Constitutional:      Appearance: Normal appearance.  He is obese.  Cardiovascular:     Rate and Rhythm: Normal rate and regular rhythm.     Pulses: Normal pulses.     Heart sounds: Normal heart sounds.  Pulmonary:     Effort: Pulmonary effort is normal.     Breath sounds: Normal breath sounds.  Neurological:     General: No focal deficit present.     Mental Status: He is alert and oriented to person, place, and time.  Psychiatric:        Mood and Affect: Mood normal.        Behavior: Behavior normal.      LABORATORY DATA:  I have reviewed the labs as listed.     Latest Ref Rng & Units 12/13/2021   11:08 AM 11/01/2021    9:45 AM 08/30/2021   12:43 PM  CBC  WBC 4.0 - 10.5 K/uL 4.7  4.1  6.5   Hemoglobin 13.0 - 17.0 g/dL 13.4  13.0  12.4   Hematocrit 39.0 - 52.0 % 38.1  36.2  35.6   Platelets 150 - 400 K/uL 219  228  210       Latest Ref Rng & Units 12/13/2021   11:08 AM 11/01/2021    9:45 AM 08/30/2021   12:43 PM  CMP  Glucose 70 - 99 mg/dL 122  122  102   BUN 8 - 23 mg/dL _0 Creatinine 0.61 - 1.24 mg/dL 0.98  1.01  0.97   Sodium 135 - 145 mmol/L 133  135  135   Potassium 3.5 - 5.1 mmol/L 4.5  3.8  4.0   Chloride 98 - 111 mmol/L 102  105  105   CO2 22 - 32 mmol/L _1 Calcium 8.9 - 10.3 mg/dL 9.2  9.3  9.1   Total Protein 6.5 - 8.1 g/dL 7.1  6.7  6.6   Total Bilirubin 0.3 - 1.2 mg/dL 1.5  0.8  1.0   Alkaline Phos 38 - 126 U/L 64  59  64   AST 15 - 41 U/L _2 ALT 0 - 44 U/L _3 DIAGNOSTIC IMAGING:  I have independently reviewed the scans and discussed with the patient. No results found.   ASSESSMENT:  1.  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  Prominent left hydronephrosis and hydroureter noted.  New sclerotic lesion in the left anterior vertebral body of L1.  Mild left periaortic adenopathy increased from prior,  short axis lymph node measuring 1 cm. -Last PSA increased to 15.8 on 09/28/2019. -As there is rapid progression of disease, I have recommended docetaxel chemotherapy.  He is agreeable after long discussion. -Cystoscopy with transurethral resection of tumor in the posterior bladder neck, right and left walls of the bladder, right lobe of the prostate on 11/10/2019. -Plan was to do 6 cycles of docetaxel followed by abiraterone and prednisone. -6 cycles of docetaxel from 11/16/2019 through 02/29/2020. -CTAP on 01/06/2020 showed persistent thickening of the urinary bladder wall.  Left hydronephrosis is significantly reduced compared to prior exam.  Left-sided retroperitoneal and left iliac  lymph nodes reduced in size.  Interval increase in sclerosis of lesions of the left aspect of L2 vertebral body consistent with treatment response.  No new bone lesions. -Bone scan on 01/06/2020 shows stable L2 metastatic focus. -Bone scan scan images from 06/14/2020 which showed progressive metastatic disease, with increased size and intensity of L2 1 new focus of activity at L1. 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 -Abiraterone and prednisone from 06/24/2020 through 09/06/2021 with progression. - PSMA PET scan (09/28/2021): Residual/recurrent tumor involving prostate gland and seminal vesicles.  Tracer avid bone metastasis localized L1 and L2 vertebral bodies.  No new sites of bone metastasis disease.  No solid organ or nodal mets.   2.  Left hydroureteronephrosis: -CT scan showed prostate mass invading the posterior bladder and obstructing the ureter.  Creatinine increased to 1.1. -Renal ultrasound on 12/11/2019 showed interval resolution of previously seen left-sided hydronephrosis.  Previously seen soft tissue mass at the posterior bladder lumen no longer seen.   PLAN:  1.  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 - Drowsiness has improved since last  visit. - Intermittent nausea is controlled well with Compazine. - Reviewed his labs today which showed normal LFTs with mildly elevated 1.5 bilirubin.  CBC was grossly normal.  PSA from today is pending.  PSA on 11/01/2021 was 9.84. -PSA from today was 10.6, minimally increased from 9.8 previously.  Hence we will continue darolutamide 600 mg twice daily.  We will repeat PSA in 12 weeks.   2.  Hot flashes: - Continue estradiol patch 0.1 mg twice weekly for hot flashes.   3.  Peripheral neuropathy: - Left foot tingling is stable.   4.  Lower back pain/bilateral hip pains: -He has reported hip and lower back pain getting more frequent in the last 6 weeks and often worse at nighttime. - He is requiring tramadol most nights which is just taking the edge of the pain.  I have offered him hydrocodone, but he would like to wait at this time.  I think hip pains are arthritic pains as he never had metastatic disease.  I reviewed previous PET scan and bone scan.  5.  Hypothyroidism: - Continue NP thyroid half tablet daily.  6.  Osteoporosis (DEXA scan on 02/24/2021 T score -2.5): - Continue calcium 600 mg and vitamin D 5000 units daily.   Orders placed this encounter:  Orders Placed This Encounter  Procedures   PSA      [name], MD [place] [phone]

## 2021-12-22 ENCOUNTER — Other Ambulatory Visit: Payer: Self-pay

## 2021-12-22 DIAGNOSIS — C61 Malignant neoplasm of prostate: Secondary | ICD-10-CM

## 2021-12-30 ENCOUNTER — Other Ambulatory Visit: Payer: Self-pay | Admitting: Internal Medicine

## 2021-12-30 ENCOUNTER — Other Ambulatory Visit: Payer: Self-pay | Admitting: Gastroenterology

## 2021-12-30 DIAGNOSIS — I10 Essential (primary) hypertension: Secondary | ICD-10-CM

## 2022-01-01 NOTE — Telephone Encounter (Signed)
Looks like pantoprazole was discontinued by Dr. Jeffie Pollock per patient's request on 12/07/2021.   If he is needing refills, recommend OV as we haven't seen in him March 2022.

## 2022-01-01 NOTE — Telephone Encounter (Signed)
Noted  

## 2022-01-01 NOTE — Telephone Encounter (Signed)
Spoke to pt, he states he is no longer taking Pantoprazole.

## 2022-01-02 ENCOUNTER — Other Ambulatory Visit (HOSPITAL_COMMUNITY): Payer: Self-pay | Admitting: Physician Assistant

## 2022-01-04 ENCOUNTER — Other Ambulatory Visit: Payer: Self-pay

## 2022-01-05 ENCOUNTER — Other Ambulatory Visit (HOSPITAL_COMMUNITY): Payer: Self-pay | Admitting: Hematology

## 2022-01-09 ENCOUNTER — Other Ambulatory Visit (HOSPITAL_COMMUNITY): Payer: Self-pay | Admitting: Hematology

## 2022-01-09 ENCOUNTER — Other Ambulatory Visit: Payer: Self-pay | Admitting: *Deleted

## 2022-01-09 DIAGNOSIS — C61 Malignant neoplasm of prostate: Secondary | ICD-10-CM

## 2022-01-09 NOTE — Telephone Encounter (Signed)
Refill approved for Scott [name].  Patient tolerating and is to continue therapy.

## 2022-01-29 ENCOUNTER — Other Ambulatory Visit: Payer: Self-pay | Admitting: Internal Medicine

## 2022-01-29 DIAGNOSIS — E039 Hypothyroidism, unspecified: Secondary | ICD-10-CM

## 2022-02-14 ENCOUNTER — Other Ambulatory Visit: Payer: Self-pay

## 2022-02-14 MED ORDER — TRAMADOL HCL 50 MG PO TABS: ORAL_TABLET | ORAL | 0 refills | Status: DC

## 2022-02-22 ENCOUNTER — Ambulatory Visit (INDEPENDENT_AMBULATORY_CARE_PROVIDER_SITE_OTHER): Payer: BC Managed Care – PPO | Admitting: Internal Medicine

## 2022-02-22 ENCOUNTER — Encounter: Payer: Self-pay | Admitting: Internal Medicine

## 2022-02-22 VITALS — BP 136/70 | HR 55 | Temp 98.2°F | Ht 66.0 in | Wt 176.0 lb

## 2022-02-22 DIAGNOSIS — Z0001 Encounter for general adult medical examination with abnormal findings: Secondary | ICD-10-CM

## 2022-02-22 DIAGNOSIS — E039 Hypothyroidism, unspecified: Secondary | ICD-10-CM

## 2022-02-22 DIAGNOSIS — Z0000 Encounter for general adult medical examination without abnormal findings: Secondary | ICD-10-CM | POA: Diagnosis not present

## 2022-02-22 DIAGNOSIS — Z23 Encounter for immunization: Secondary | ICD-10-CM | POA: Diagnosis not present

## 2022-02-22 DIAGNOSIS — R739 Hyperglycemia, unspecified: Secondary | ICD-10-CM | POA: Insufficient documentation

## 2022-02-22 DIAGNOSIS — I10 Essential (primary) hypertension: Secondary | ICD-10-CM | POA: Diagnosis not present

## 2022-02-22 LAB — BASIC METABOLIC PANEL
BUN: 15 mg/dL (ref 6–23)
CO2: 26 mEq/L (ref 19–32)
Calcium: 9.4 mg/dL (ref 8.4–10.5)
Chloride: 101 mEq/L (ref 96–112)
Creatinine, Ser: 1.04 mg/dL (ref 0.40–1.50)
GFR: 75.71 mL/min (ref >60.00)
Glucose, Bld: 110 mg/dL — ABNORMAL HIGH (ref 70–99)
Potassium: 4.3 mEq/L (ref 3.5–5.1)
Sodium: 134 mEq/L — ABNORMAL LOW (ref 135–145)

## 2022-02-22 LAB — TSH: TSH: 2 u[IU]/mL (ref 0.35–5.50)

## 2022-02-22 LAB — HEMOGLOBIN A1C: Hgb A1c MFr Bld: 5.8 % (ref 4.6–6.5)

## 2022-02-22 MED ORDER — NP THYROID 120 MG PO TABS: 60.0000 mg | ORAL_TABLET | Freq: Every morning | ORAL | 1 refills | Status: DC

## 2022-02-22 NOTE — Patient Instructions (Signed)
Health Maintenance, Male Adopting a healthy lifestyle and getting preventive care are important in promoting health and wellness. Ask your health care provider about: The right schedule for you to have regular tests and exams. Things you can do on your own to prevent diseases and keep yourself healthy. What should I know about diet, weight, and exercise? Eat a healthy diet  Eat a diet that includes plenty of vegetables, fruits, low-fat dairy products, and lean protein. Do not eat a lot of foods that are high in solid fats, added sugars, or sodium. Maintain a healthy weight Body mass index (BMI) is a measurement that can be used to identify possible weight problems. It estimates body fat based on height and weight. Your health care provider can help determine your BMI and help you achieve or maintain a healthy weight. Get regular exercise Get regular exercise. This is one of the most important things you can do for your health. Most adults should: Exercise for at least 150 minutes each week. The exercise should increase your heart rate and make you sweat (moderate-intensity exercise). Do strengthening exercises at least twice a week. This is in addition to the moderate-intensity exercise. Spend less time sitting. Even light physical activity can be beneficial. Watch cholesterol and blood lipids Have your blood tested for lipids and cholesterol at 64 years of age, then have this test every 5 years. You may need to have your cholesterol levels checked more often if: Your lipid or cholesterol levels are high. You are older than 64 years of age. You are at high risk for heart disease. What should I know about cancer screening? Many types of cancers can be detected early and may often be prevented. Depending on your health history and family history, you may need to have cancer screening at various ages. This may include screening for: Colorectal cancer. Prostate cancer. Skin cancer. Lung  cancer. What should I know about heart disease, diabetes, and high blood pressure? Blood pressure and heart disease High blood pressure causes heart disease and increases the risk of stroke. This is more likely to develop in people who have high blood pressure readings or are overweight. Talk with your health care provider about your target blood pressure readings. Have your blood pressure checked: Every 3-5 years if you are 18-39 years of age. Every year if you are 40 years old or older. If you are between the ages of 65 and 75 and are a current or former smoker, ask your health care provider if you should have a one-time screening for abdominal aortic aneurysm (AAA). Diabetes Have regular diabetes screenings. This checks your fasting blood sugar level. Have the screening done: Once every three years after age 45 if you are at a normal weight and have a low risk for diabetes. More often and at a younger age if you are overweight or have a high risk for diabetes. What should I know about preventing infection? Hepatitis B If you have a higher risk for hepatitis B, you should be screened for this virus. Talk with your health care provider to find out if you are at risk for hepatitis B infection. Hepatitis C Blood testing is recommended for: Everyone born from 1945 through 1965. Anyone with known risk factors for hepatitis C. Sexually transmitted infections (STIs) You should be screened each year for STIs, including gonorrhea and chlamydia, if: You are sexually active and are younger than 64 years of age. You are older than 64 years of age and your   health care provider tells you that you are at risk for this type of infection. Your sexual activity has changed since you were last screened, and you are at increased risk for chlamydia or gonorrhea. Ask your health care provider if you are at risk. Ask your health care provider about whether you are at high risk for HIV. Your health care provider  may recommend a prescription medicine to help prevent HIV infection. If you choose to take medicine to prevent HIV, you should first get tested for HIV. You should then be tested every 3 months for as long as you are taking the medicine. Follow these instructions at home: Alcohol use Do not drink alcohol if your health care provider tells you not to drink. If you drink alcohol: Limit how much you have to 0-2 drinks a day. Know how much alcohol is in your drink. In the [place] one drink equals one 12 oz bottle of beer (355 mL), one 5 oz glass of wine (148 mL), or one 1 oz glass of hard liquor (44 mL). Lifestyle Do not use any products that contain nicotine or tobacco. These products include cigarettes, chewing tobacco, and vaping devices, such as e-cigarettes. If you need help quitting, ask your health care provider. Do not use street drugs. Do not share needles. Ask your health care provider for help if you need support or information about quitting drugs. General instructions Schedule regular health, dental, and eye exams. Stay current with your vaccines. Tell your health care provider if: You often feel depressed. You have ever been abused or do not feel safe at home. Summary Adopting a healthy lifestyle and getting preventive care are important in promoting health and wellness. Follow your health care provider's instructions about healthy diet, exercising, and getting tested or screened for diseases. Follow your health care provider's instructions on monitoring your cholesterol and blood pressure. This information is not intended to replace advice given to you by your health care provider. Make sure you discuss any questions you have with your health care provider. Document Revised: 07/25/2020 Document Reviewed: 07/25/2020 Elsevier Patient Education  2023 [employer]  

## 2022-02-22 NOTE — Progress Notes (Unsigned)
Subjective:  Patient ID: Scott [name], male    DOB: 1955/05/08  Age: 66 y.o. MRN: [id number]  CC: Hypertension, Annual Exam, and Hypothyroidism   HPI Scott [name] presents for a CPX and f/up -  He has chronic unchanged shortness of breath.  This is not exacerbated by exertion.  He walks a mile a day and denies diaphoresis, chest pain, dizziness, lightheadedness, or edema.  Outpatient Medications Prior to Visit  Medication Sig Dispense Refill   arginine 500 MG tablet Take 500 mg by mouth 2 (two) times daily.     Cholecalciferol 125 MCG (5000 UT) capsule Take 5,000-10,000 Units by mouth See admin instructions. Take 10000 units in the morning and 5000 units at night     DANDELION ROOT PO Take 500 mg by mouth daily.     dutasteride (AVODART) 0.5 MG capsule Take 1 capsule (0.5 mg total) by mouth daily. 90 capsule 3   estradiol (VIVELLE-DOT) 0.1 MG/24HR patch APPLY 1 PATCH(0.1 MG) TOPICALLY TO THE SKIN 2 TIMES A WEEK 8 patch 12   ibuprofen (ADVIL) 200 MG tablet Take 400 mg by mouth every 6 (six) hours as needed for moderate pain.     MAGNESIUM PO Take 1 tablet by mouth at bedtime.     MELATONIN PO Take 40 mg by mouth at bedtime.     MILK THISTLE PO Take 1 capsule by mouth daily.     NATTOKINASE PO Take 2 capsules by mouth daily.     NUBEQA 300 MG tablet TAKE 2 TABLETS TWICE DAILY WITH A MEAL 120 tablet 3   olmesartan (BENICAR) 20 MG tablet TAKE 1 TABLET(20 MG) BY MOUTH DAILY 90 tablet 0   ondansetron (ZOFRAN ODT) 8 MG disintegrating tablet Take 1 tablet (8 mg total) by mouth every 8 (eight) hours as needed for nausea or vomiting. 60 tablet 3   QUERCETIN PO Take 1 tablet by mouth 2 (two) times daily.     thiamine (VITAMIN B-1) 50 MG tablet Take 1 tablet (50 mg total) by mouth daily. 90 tablet 1   traMADol (ULTRAM) 50 MG tablet TAKE 1 TABLET(50 MG) BY MOUTH AT BEDTIME AS NEEDED 30 tablet 0   VITAMIN K PO Take 1 capsule by mouth every other day.     Zinc 30 MG CAPS Take 30 mg by mouth daily.      NP THYROID 120 MG tablet TAKE 1/2 TABLET BY MOUTH EVERY MORNING 45 tablet 0   No facility-administered medications prior to visit.    ROS Review of Systems  Constitutional: Negative.  Negative for diaphoresis and fatigue.  HENT: Negative.    Respiratory:  Positive for shortness of breath. Negative for cough, chest tightness and wheezing.   Cardiovascular:  Negative for chest pain, palpitations and leg swelling.  Gastrointestinal:  Negative for diarrhea and nausea.  Genitourinary: Negative.  Negative for difficulty urinating and dysuria.  Musculoskeletal: Negative.  Negative for arthralgias and back pain.  Skin: Negative.   Neurological: Negative.  Negative for dizziness, weakness, light-headedness and numbness.  Hematological:  Negative for adenopathy. Does not bruise/bleed easily.    Objective:  BP 136/70 (BP Location: Left Arm, Patient Position: Sitting, Cuff Size: Large)   Pulse (!) 55   Temp 98.2 F (36.8 C) (Oral)   Ht '5\' 6"'$  (1.676 m)   Wt 176 lb (79.8 kg)   SpO2 98%   BMI 28.41 kg/m   BP Readings from Last 3 Encounters:  02/22/22 136/70  12/20/21 138/79  12/07/21 124/72  Wt Readings from Last 3 Encounters:  02/22/22 176 lb (79.8 kg)  12/20/21 183 lb 6.4 oz (83.2 kg)  11/01/21 197 lb 6.4 oz (89.5 kg)    Physical Exam Vitals reviewed.  HENT:     Nose: Nose normal.     Mouth/Throat:     Mouth: Mucous membranes are moist.  Eyes:     General: No scleral icterus.    Conjunctiva/sclera: Conjunctivae normal.  Cardiovascular:     Rate and Rhythm: Regular rhythm. Bradycardia present.     Heart sounds: No murmur heard.    Comments: EKG-  SB, 51 bpm No LVH or Q waves Pulmonary:     Effort: Pulmonary effort is normal.     Breath sounds: No stridor. No wheezing, rhonchi or rales.  Abdominal:     General: Abdomen is flat.     Palpations: There is no mass.     Tenderness: There is no abdominal tenderness. There is no guarding.     Hernia: No hernia is  present.  Musculoskeletal:        General: Normal range of motion.     Cervical back: Neck supple.     Right lower leg: No edema.     Left lower leg: No edema.  Lymphadenopathy:     Cervical: No cervical adenopathy.  Skin:    General: Skin is warm and dry.     Coloration: Skin is not pale.  Neurological:     General: No focal deficit present.     Mental Status: He is alert.  Psychiatric:        Mood and Affect: Mood normal.        Behavior: Behavior normal.     Lab Results  Component Value Date   WBC 4.7 12/13/2021   HGB 13.4 12/13/2021   HCT 38.1 (L) 12/13/2021   PLT 219 12/13/2021   GLUCOSE 110 (H) 02/22/2022   CHOL 218 (H) 08/24/2021   TRIG 187.0 (H) 08/24/2021   HDL 41.60 08/24/2021   LDLCALC 139 (H) 08/24/2021   ALT 18 12/13/2021   AST 18 12/13/2021   NA 134 (L) 02/22/2022   K 4.3 02/22/2022   CL 101 02/22/2022   CREATININE 1.04 02/22/2022   BUN 15 02/22/2022   CO2 26 02/22/2022   TSH 2.00 02/22/2022   PSA 1.12 02/08/2021   HGBA1C 5.8 02/22/2022    NM PET (PSMA) SKULL TO MID THIGH  Result Date: 09/30/2021 CLINICAL DATA:  Subsequent treatment strategy for prostate cancer. EXAM: NUCLEAR MEDICINE PET SKULL BASE TO THIGH TECHNIQUE: 9.01 mCi F18 Piflufolastat (Pylarify) was injected intravenously. Full-ring PET imaging was performed from the skull base to thigh after the radiotracer. CT data was obtained and used for attenuation correction and anatomic localization. COMPARISON:  PET-CT 12/11/2018. CT AP 06/03/2020. NM bone scan 06/14/2020 FINDINGS: NECK No radiotracer activity in neck lymph nodes. Incidental CT finding: None CHEST No radiotracer accumulation within mediastinal or hilar lymph nodes. No suspicious pulmonary nodules on the CT scan. Incidental CT finding: Aortic atherosclerosis and coronary artery calcifications. ABDOMEN/PELVIS Prostate: Signs of previous TURP. There is increased tracer uptake localizing to the residual portions of the prostate gland and  extending into the junction of the prostate gland and seminal vesicles. The SUV max is equal to 17.11 and is concerning for residual/recurrent FDG avid tumor, image 266/3. On the previous PET-CT the SUV max was equal to 8.1. Lymph nodes: No abnormal radiotracer accumulation within pelvic or abdominal nodes. Liver: No evidence of liver  metastasis Incidental CT finding: Aortic atherosclerosis. Supraumbilical, paramidline abdominal wall hernias are identified which contain fat only, image 211/3. SKELETON Tracer avid bone metastases localizes to the L2 vertebral body with SUV max 2.7. The corresponding CT images there is heterogeneous increased sclerosis, image 194/3. The degree of sclerosis has increased when compared with 06/03/2020. Mildly tracer avid lesion with progressive sclerosis localizes to the superior and anterior aspect of the L1 vertebral body with SUV max of 3.42, image 181/3. Bilateral gynecomastia IMPRESSION: 1. Signs of residual/recurrent tracer avid tumor involving the prostate gland and seminal vesicles. The degree of tracer uptake within this area has increased when compared with the PET-CT from 12/11/2018. 2. Tracer avid bone metastases localized to the L1 and L2 vertebral bodies. On the corresponding CT images there are progressive sclerotic changes identified. No new sites of osseous metastatic disease identified compared with NM bone scan from 06/09/2020. 3. No signs of tracer avid nodal metastasis or solid organ metastasis. 4. Aortic Atherosclerosis (ICD10-I70.0). Coronary artery calcifications. Electronically Signed   By: Kerby Moors M.D.   On: 09/30/2021 16:07    Assessment & Plan:   Scott [name] was seen today for hypertension, annual exam and hypothyroidism.  Diagnoses and all orders for this visit:  Acquired hypothyroidism- He is euthyroid. -     TSH; Future -     TSH -     NP THYROID 120 MG tablet; Take 0.5 tablets (60 mg total) by mouth every morning.  Hypertension,  unspecified type- His blood pressure is well-controlled.  EKG is reassuring. -     Hemoglobin A1c; Future -     Basic metabolic panel; Future -     EKG 12-Lead -     Basic metabolic panel -     Hemoglobin A1c  Encounter for general adult medical examination with abnormal findings- Exam completed, labs reviewed, vaccines reviewed and updated, cancer screenings are up-to-date, patient education was given.  Chronic hyperglycemia -     Hemoglobin A1c; Future -     Hemoglobin A1c  Other orders -     Flu Vaccine QUAD 6+ mos PF IM (Fluarix Quad PF) -     Zoster Recombinant (Shingrix )   I have changed Scott Mace "Will"'s NP Thyroid. I am also having him maintain his Cholecalciferol, Zinc, VITAMIN K PO, QUERCETIN PO, MAGNESIUM PO, MILK THISTLE PO, NATTOKINASE PO, ibuprofen, MELATONIN PO, DANDELION ROOT PO, arginine, thiamine, estradiol, ondansetron, dutasteride, olmesartan, Nubeqa, and traMADol.  Meds ordered this encounter  Medications   NP THYROID 120 MG tablet    Sig: Take 0.5 tablets (60 mg total) by mouth every morning.    Dispense:  45 tablet    Refill:  1     Follow-up: Return in about 6 months (around 08/24/2022).  Scarlette Calico, MD

## 2022-03-02 ENCOUNTER — Encounter: Payer: Self-pay | Admitting: Licensed Clinical Social Worker

## 2022-03-02 NOTE — Progress Notes (Signed)
UPDATE: VUS in [place] called c.61C>T has been reclassified to "Likely  Benign." The report date is 03/01/2022.

## 2022-03-08 ENCOUNTER — Inpatient Hospital Stay: Payer: BC Managed Care – PPO | Attending: Hematology

## 2022-03-08 DIAGNOSIS — M810 Age-related osteoporosis without current pathological fracture: Secondary | ICD-10-CM | POA: Insufficient documentation

## 2022-03-08 DIAGNOSIS — Z800 Family history of malignant neoplasm of digestive organs: Secondary | ICD-10-CM | POA: Insufficient documentation

## 2022-03-08 DIAGNOSIS — M25551 Pain in right hip: Secondary | ICD-10-CM | POA: Diagnosis not present

## 2022-03-08 DIAGNOSIS — C7951 Secondary malignant neoplasm of bone: Secondary | ICD-10-CM | POA: Diagnosis not present

## 2022-03-08 DIAGNOSIS — M25552 Pain in left hip: Secondary | ICD-10-CM | POA: Insufficient documentation

## 2022-03-08 DIAGNOSIS — Z803 Family history of malignant neoplasm of breast: Secondary | ICD-10-CM | POA: Insufficient documentation

## 2022-03-08 DIAGNOSIS — G629 Polyneuropathy, unspecified: Secondary | ICD-10-CM | POA: Insufficient documentation

## 2022-03-08 DIAGNOSIS — Z8042 Family history of malignant neoplasm of prostate: Secondary | ICD-10-CM | POA: Insufficient documentation

## 2022-03-08 DIAGNOSIS — I10 Essential (primary) hypertension: Secondary | ICD-10-CM | POA: Insufficient documentation

## 2022-03-08 DIAGNOSIS — M545 Low back pain, unspecified: Secondary | ICD-10-CM | POA: Insufficient documentation

## 2022-03-08 DIAGNOSIS — Z87891 Personal history of nicotine dependence: Secondary | ICD-10-CM | POA: Insufficient documentation

## 2022-03-08 DIAGNOSIS — C61 Malignant neoplasm of prostate: Secondary | ICD-10-CM | POA: Diagnosis present

## 2022-03-08 LAB — CBC WITH DIFFERENTIAL/PLATELET
Abs Immature Granulocytes: 0.02 10*3/uL (ref 0.00–0.07)
Basophils Absolute: 0.1 10*3/uL (ref 0.0–0.1)
Basophils Relative: 1 %
Eosinophils Absolute: 0.1 10*3/uL (ref 0.0–0.5)
Eosinophils Relative: 1 %
HCT: 37.1 % — ABNORMAL LOW (ref 39.0–52.0)
Hemoglobin: 12.6 g/dL — ABNORMAL LOW (ref 13.0–17.0)
Immature Granulocytes: 0 %
Lymphocytes Relative: 21 %
Lymphs Abs: 1.4 10*3/uL (ref 0.7–4.0)
MCH: 31.5 pg (ref 26.0–34.0)
MCHC: 34 g/dL (ref 30.0–36.0)
MCV: 92.8 fL (ref 80.0–100.0)
Monocytes Absolute: 0.5 10*3/uL (ref 0.1–1.0)
Monocytes Relative: 7 %
Neutro Abs: 4.5 10*3/uL (ref 1.7–7.7)
Neutrophils Relative %: 70 %
Platelets: 221 10*3/uL (ref 150–400)
RBC: 4 MIL/uL — ABNORMAL LOW (ref 4.22–5.81)
RDW: 12.4 % (ref 11.5–15.5)
WBC: 6.4 10*3/uL (ref 4.0–10.5)
nRBC: 0 % (ref 0.0–0.2)

## 2022-03-08 LAB — COMPREHENSIVE METABOLIC PANEL
ALT: 14 U/L (ref 0–44)
AST: 16 U/L (ref 15–41)
Albumin: 4.1 g/dL (ref 3.5–5.0)
Alkaline Phosphatase: 73 U/L (ref 38–126)
Anion gap: 8 (ref 5–15)
BUN: 16 mg/dL (ref 8–23)
CO2: 22 mmol/L (ref 22–32)
Calcium: 9.1 mg/dL (ref 8.9–10.3)
Chloride: 101 mmol/L (ref 98–111)
Creatinine, Ser: 0.93 mg/dL (ref 0.61–1.24)
GFR, Estimated: >60 mL/min (ref >60)
Glucose, Bld: 103 mg/dL — ABNORMAL HIGH (ref 70–99)
Potassium: 4.2 mmol/L (ref 3.5–5.1)
Sodium: 131 mmol/L — ABNORMAL LOW (ref 135–145)
Total Bilirubin: 1.5 mg/dL — ABNORMAL HIGH (ref 0.3–1.2)
Total Protein: 6.8 g/dL (ref 6.5–8.1)

## 2022-03-08 LAB — PSA: Prostatic Specific Antigen: 18.89 ng/mL — ABNORMAL HIGH (ref 0.00–4.00)

## 2022-03-15 ENCOUNTER — Inpatient Hospital Stay: Payer: BC Managed Care – PPO | Admitting: Hematology

## 2022-03-15 VITALS — BP 125/79 | HR 77 | Temp 98.0°F | Resp 18 | Ht 66.0 in | Wt 175.1 lb

## 2022-03-15 DIAGNOSIS — C61 Malignant neoplasm of prostate: Secondary | ICD-10-CM | POA: Diagnosis not present

## 2022-03-15 MED ORDER — TRAMADOL HCL 100 MG PO TABS: 100.0000 mg | ORAL_TABLET | Freq: Every evening | ORAL | 0 refills | Status: DC | PRN

## 2022-03-15 NOTE — Progress Notes (Signed)
[place], [place] [address]   CLINIC:  Medical Oncology/Hematology  PCP:  Janith Lima, MD [address] 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 TURP on 11/10/2019. 2. Docetaxel x 6 cycles from 11/16/2019 to 02/29/2020. 3.  Abiraterone and prednisone  NGS Results: not done  CURRENT THERAPY: Darolutamide 600 mg twice daily  BRIEF ONCOLOGIC HISTORY:  Oncology History  Prostate cancer metastatic to multiple sites [facility])  01/25/2017 Initial Diagnosis   Prostate cancer metastatic to multiple sites [facility])   11/16/2019 - 03/02/2020 Chemotherapy   Patient is on Treatment Plan : PROSTATE Docetaxel + Prednisone q21d      Genetic Testing   Negative genetic testing. No pathogenic variants identified on the [facility] CancerNext-Expanded panel. VUS in [place] identified called c.61C>T. The report date is 04/19/2020.  The CancerNext-Expanded  gene panel offered by [facility] and includes sequencing and rearrangement analysis for the following 77 genes: IP, ALK, APC*, ATM*, AXIN2, BAP1, BARD1, BLM, BMPR1A, BRCA1*, BRCA2*, BRIP1*, CDC73, CDH1*,CDK4, CDKN1B, CDKN2A, CHEK2*, CTNNA1, DICER1, FANCC, FH, FLCN, GALNT12, KIF1B, LZTR1, MAX, MEN1, MET, MLH1*, MSH2*, MSH3, MSH6*, MUTYH*, NBN, NF1*, NF2, NTHL1, PALB2*, PHOX2B, PMS2*, POT1, PRKAR1A, PTCH1, PTEN*, RAD51C*, RAD51D*,RB1, RECQL, RET, SDHA, SDHAF2, SDHB, SDHC, SDHD, SMAD4, SMARCA4, SMARCB1, SMARCE1, STK11, SUFU, TMEM127, TP53*,TSC1, TSC2, VHL and XRCC2 (sequencing and deletion/duplication); EGFR, EGLN1, HOXB13, KIT, MITF, PDGFRA, POLD1 and POLE (sequencing only); EPCAM and GREM1 (deletion/duplication only).      CANCER STAGING:  Cancer Staging  No matching staging information was found for the patient.  INTERVAL HISTORY:  Mr. Jerrel Tiberio, a 66 y.o. male, seen for follow-up of metastatic  prostate cancer to the bones.  He is taking darolutamide 600 mg twice daily very well.  He reports back pain and hip pain worse in the last couple of weeks.  He is having to take tramadol 50 mg every night.  He also feels like his perineal area is inflamed all the time for the last 2 weeks.  He is also using Zofran for nausea/vomiting.  REVIEW OF SYSTEMS:  Review of Systems  Respiratory:  Positive for shortness of breath.   Gastrointestinal:  Positive for nausea and vomiting.  Musculoskeletal:  Positive for arthralgias (2/10 hips) and back pain (2/10).  Neurological:  Negative for light-headedness and numbness.  Psychiatric/Behavioral:  Positive for sleep disturbance.   All other systems reviewed and are negative.   PAST MEDICAL/SURGICAL HISTORY:  Past Medical History:  Diagnosis Date   Bladder obstruction    Complication of anesthesia    Diverticulitis 2015   Dyspnea    Essential hypertension, benign 01/26/2019   at [place] office elevated, normal at home, no medications at this time   History of basal cell cancer    HLD (hyperlipidemia) 01/26/2019   Hypothyroidism    history of, not currently taking medication   PONV (postoperative nausea and vomiting)    Prostate cancer ([place])    metastatic to bones   Vitamin D deficiency disease 01/26/2019   Past Surgical History:  Procedure Laterality Date   CYSTOSCOPY W/ URETERAL STENT PLACEMENT Left 11/10/2019   Procedure: CYSTOSCOPY;  Surgeon: Irine Seal, MD;  Location: [facility];  Service: Urology;  Laterality: Left;   ESOPHAGOGASTRODUODENOSCOPY (EGD) WITH PROPOFOL N/A 07/07/2020   Procedure: ESOPHAGOGASTRODUODENOSCOPY (EGD) WITH PROPOFOL;  Surgeon: Daneil Dolin, MD;  Location: [facility];  Service: Endoscopy;  Laterality: N/A;  pm appt   HERNIA REPAIR  [id number]   UMBILICAL    MALONEY DILATION N/A 07/07/2020   Procedure: MALONEY DILATION;  Surgeon: Daneil Dolin, MD;  Location: [facility];  Service: Endoscopy;  Laterality: N/A;    PROSTATE SURGERY     SKIN CANCER EXCISION     TONSILLECTOMY Bilateral    TOOTH EXTRACTION     front left tooth pulled but no surgery or stitches   TRANSURETHRAL RESECTION OF PROSTATE N/A 01/25/2017   Procedure: TRANSURETHRAL RESECTION OF THE PROSTATE (TURP);  Surgeon: Alexis Frock, MD;  Location: [facility];  Service: Urology;  Laterality: N/A;   TRANSURETHRAL RESECTION OF PROSTATE N/A 11/10/2019   Procedure: TRANSURETHRAL RESECTION OF THE PROSTATE (TURP);  Surgeon: Irine Seal, MD;  Location: [facility];  Service: Urology;  Laterality: N/A;   WISDOM TOOTH EXTRACTION      SOCIAL HISTORY:  Social History   Socioeconomic History   Marital status: Married    Spouse name: Not on file   Number of children: 1   Years of education: Not on file   Highest education level: Not on file  Occupational History    Comment: working full time  Tobacco Use   Smoking status: Former    Packs/day: 1.50    Years: 35.00    Total pack years: 52.50    Types: Cigarettes    Quit date: 01/12/2004    Years since quitting: 18.1   Smokeless tobacco: Never   Tobacco comments:    OVER 30 YEARS HX OF SMOKING   Vaping Use   Vaping Use: Never used  Substance and Sexual Activity   Alcohol use: Not Currently    Comment: heavy drinker until 2004   Drug use: Not Currently    Types: Marijuana   Sexual activity: Not Currently  Other Topics Concern   Not on file  Social History Narrative   Married for 19 years.Works for [employer] from home.   Social Determinants of Health   Financial Resource Strain: Low Risk  (03/02/2020)   Overall Financial Resource Strain (CARDIA)    Difficulty of Paying Living Expenses: Not hard at all  Food Insecurity: No Food Insecurity (03/02/2020)   Hunger Vital Sign    Worried About Running Out of Food in the Last Year: Never true    Ran Out of Food in the Last Year: Never true  Transportation Needs: No Transportation Needs (03/02/2020)   PRAPARE - Radiographer, therapeutic (Medical): No    Lack of Transportation (Non-Medical): No  Physical Activity: Inactive (03/02/2020)   Exercise Vital Sign    Days of Exercise per Week: 0 days    Minutes of Exercise per Session: 0 min  Stress: No Stress Concern Present (03/02/2020)   [place]    Feeling of Stress : Only a little  Social Connections: Moderately Integrated (03/02/2020)   Social Connection and Isolation Panel [NHANES]    Frequency of Communication with Friends and Family: Three times a week    Frequency of Social Gatherings with Friends and Family: Never    Attends Religious Services: Never    Marine scientist or Organizations: Yes    Attends Archivist Meetings: 1 to 4 times per year    Marital Status: Married  Human resources officer Violence: Not At Risk (03/02/2020)   Humiliation, Afraid, Rape, and Kick questionnaire    Fear of  Current or Ex-Partner: No    Emotionally Abused: No    Physically Abused: No    Sexually Abused: No    FAMILY HISTORY:  Family History  Problem Relation Age of Onset   Emphysema Mother    Alzheimer's disease Father    Prostate cancer Father        dx in his 68s   Prostate cancer Brother        dx in his 78s   Breast cancer Maternal Grandmother        11s   Pancreatic cancer Maternal Grandfather        38s   Dementia Paternal Grandmother    Heart attack Paternal Grandfather    Colon cancer Neg Hx     CURRENT MEDICATIONS:  Current Outpatient Medications  Medication Sig Dispense Refill   arginine 500 MG tablet Take 500 mg by mouth 2 (two) times daily.     Cholecalciferol 125 MCG (5000 UT) capsule Take 5,000-10,000 Units by mouth See admin instructions. Take 10000 units in the morning and 5000 units at night     DANDELION ROOT PO Take 500 mg by mouth daily.     dutasteride (AVODART) 0.5 MG capsule Take 1 capsule (0.5 mg total) by mouth daily. 90 capsule 3   estradiol  (VIVELLE-DOT) 0.1 MG/24HR patch APPLY 1 PATCH(0.1 MG) TOPICALLY TO THE SKIN 2 TIMES A WEEK 8 patch 12   ibuprofen (ADVIL) 200 MG tablet Take 400 mg by mouth every 6 (six) hours as needed for moderate pain.     MAGNESIUM PO Take 1 tablet by mouth at bedtime.     MELATONIN PO Take 40 mg by mouth at bedtime.     MILK THISTLE PO Take 1 capsule by mouth daily.     NATTOKINASE PO Take 2 capsules by mouth daily.     NP THYROID 120 MG tablet Take 0.5 tablets (60 mg total) by mouth every morning. 45 tablet 1   NUBEQA 300 MG tablet TAKE 2 TABLETS TWICE DAILY WITH A MEAL 120 tablet 3   olmesartan (BENICAR) 20 MG tablet TAKE 1 TABLET(20 MG) BY MOUTH DAILY 90 tablet 0   ondansetron (ZOFRAN ODT) 8 MG disintegrating tablet Take 1 tablet (8 mg total) by mouth every 8 (eight) hours as needed for nausea or vomiting. 60 tablet 3   predniSONE (DELTASONE) 5 MG tablet Take 5 mg by mouth daily.     QUERCETIN PO Take 1 tablet by mouth 2 (two) times daily.     thiamine (VITAMIN B-1) 50 MG tablet Take 1 tablet (50 mg total) by mouth daily. 90 tablet 1   traMADol HCl 100 MG TABS Take 100 mg by mouth at bedtime as needed. 60 tablet 0   VITAMIN K PO Take 1 capsule by mouth every other day.     Zinc 30 MG CAPS Take 30 mg by mouth daily.     No current facility-administered medications for this visit.    ALLERGIES:  Allergies  Allergen Reactions   Crestor [Rosuvastatin] Other (See Comments)    myalgia   Ivp Dye [Iodinated Contrast Media] Swelling    Swelling on head, took Benadryl and swelling reduced   Wellbutrin [Bupropion] Hives    PHYSICAL EXAM:  Performance status (ECOG): 0 - Asymptomatic  Vitals:   03/15/22 1505  BP: 125/79  Pulse: 77  Resp: 18  Temp: 98 F (36.7 C)  SpO2: 99%   Wt Readings from Last 3 Encounters:  03/15/22 175 lb 1.6  oz (79.4 kg)  02/22/22 176 lb (79.8 kg)  12/20/21 183 lb 6.4 oz (83.2 kg)   Physical Exam Vitals reviewed.  Constitutional:      Appearance: Normal appearance.  He is obese.  Cardiovascular:     Rate and Rhythm: Normal rate and regular rhythm.     Pulses: Normal pulses.     Heart sounds: Normal heart sounds.  Pulmonary:     Effort: Pulmonary effort is normal.     Breath sounds: Normal breath sounds.  Neurological:     General: No focal deficit present.     Mental Status: He is alert and oriented to person, place, and time.  Psychiatric:        Mood and Affect: Mood normal.        Behavior: Behavior normal.      LABORATORY DATA:  I have reviewed the labs as listed.     Latest Ref Rng & Units 03/08/2022    2:00 PM 12/13/2021   11:08 AM 11/01/2021    9:45 AM  CBC  WBC 4.0 - 10.5 K/uL 6.4  4.7  4.1   Hemoglobin 13.0 - 17.0 g/dL 12.6  13.4  13.0   Hematocrit 39.0 - 52.0 % 37.1  38.1  36.2   Platelets 150 - 400 K/uL 221  219  228       Latest Ref Rng & Units 03/08/2022    2:00 PM 02/22/2022    1:53 PM 12/13/2021   11:08 AM  CMP  Glucose 70 - 99 mg/dL 103  110  122   BUN 8 - 23 mg/dL _0 Creatinine 0.61 - 1.24 mg/dL 0.93  1.04  0.98   Sodium 135 - 145 mmol/L 131  134  133   Potassium 3.5 - 5.1 mmol/L 4.2  4.3  4.5   Chloride 98 - 111 mmol/L 101  101  102   CO2 22 - 32 mmol/L _1 Calcium 8.9 - 10.3 mg/dL 9.1  9.4  9.2   Total Protein 6.5 - 8.1 g/dL 6.8   7.1   Total Bilirubin 0.3 - 1.2 mg/dL 1.5   1.5   Alkaline Phos 38 - 126 U/L 73   64   AST 15 - 41 U/L 16   18   ALT 0 - 44 U/L 14   18     DIAGNOSTIC IMAGING:  I have independently reviewed the scans and discussed with the patient. No results found.   ASSESSMENT:  1.  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  Prominent left hydronephrosis and hydroureter noted.  New sclerotic lesion in the left anterior vertebral body of L1.  Mild left periaortic adenopathy increased from prior, short axis lymph  node measuring 1 cm. -Last PSA increased to 15.8 on 09/28/2019. -As there is rapid progression of disease, I have recommended docetaxel chemotherapy.  He is agreeable after long discussion. -Cystoscopy with transurethral resection of tumor in the posterior bladder neck, right and left walls of the bladder, right lobe of the prostate on 11/10/2019. -Plan was to do 6 cycles of docetaxel followed by abiraterone and prednisone. -6 cycles of docetaxel from 11/16/2019 through 02/29/2020. -CTAP on 01/06/2020 showed persistent thickening of the urinary bladder wall.  Left hydronephrosis is significantly reduced compared to prior exam.  Left-sided retroperitoneal and left iliac lymph nodes reduced in size.  Interval increase in sclerosis of lesions of the left aspect of L2 vertebral body consistent with treatment response.  No new bone lesions. -Bone scan on 01/06/2020 shows stable L2 metastatic focus. -Bone scan scan images from 06/14/2020 which showed progressive metastatic disease, with increased size and intensity of L2 1 new focus of activity at L1. 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 -Abiraterone and prednisone from 06/24/2020 through 09/06/2021 with progression. - PSMA PET scan (09/28/2021): Residual/recurrent tumor involving prostate gland and seminal vesicles.  Tracer avid bone metastasis localized L1 and L2 vertebral bodies.  No new sites of bone metastasis disease.  No solid organ or nodal mets. - Darolutamide from 10/17/2021 through 03/15/2022 with progression   2.  Left hydroureteronephrosis: -CT scan showed prostate mass invading the posterior bladder and obstructing the ureter.  Creatinine increased to 1.1. -Renal ultrasound on 12/11/2019 showed interval resolution of previously seen left-sided hydronephrosis.  Previously seen soft tissue mass at the posterior bladder lumen no longer seen.   PLAN:  1.  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 - Reviewed labs from 03/08/2022 which showed PSA increased to 18.8 from 9 prior to start of darolutamide. - He also reports worsening pain in the lower back and hips consistent with progression. - I have discussed further options including Pluvicto and chemotherapy options including cabazitaxel and docetaxel rechallenge. - We also discussed the role of pembrolizumab for MSI high tumor, MMR depleted or TMB high tumors.  His original biopsy is more than 64 years old. - We will send Guardant360.  If it is negative, we will consider rebiopsy for NGS testing. - We will also obtain baseline bone scan and CT AP. - We will make a referral to nuclear medicine to discuss Pluvicto.  He will come back after discussion with nuclear medicine.   2.  Hot flashes: - Continue estradiol patch 0.1 mg twice weekly for hot flashes.   3.  Peripheral neuropathy: - Left foot tingling is stable.   4.  Lower back pain/bilateral hip pains: - He reports worsening of her lower back pain and hip pain for the last 2 weeks. - He is taking tramadol 50 mg at nighttime which is not lasting.  He does not want to start hydrocodone yet. - I will increase tramadol to 100 mg at bedtime and during daytime as needed.  5.  Osteoporosis (DEXA scan 02/24/2021 T-score -2.5): - We have previously discussed role of denosumab/zoledronic acid.  He has poor dentition and has decided against it.  Continue calcium and vitamin D 5000 units daily.    Orders placed this encounter:  No orders of the defined types were placed in this encounter.     [name], MD [place] [phone]

## 2022-03-15 NOTE — Patient Instructions (Addendum)
Scott [name]  Discharge Instructions  You were seen and examined today by Dr. Delton Coombes.  Dr. Delton Coombes discussed your most recent lab work and PET scan which revealed that your PSA prostate marker has went up and your PET scan was positive.   Dr. Delton Coombes recommends changing therapy to Pluvicto. Pluvicto is given by nuclear medicine in [place].   Follow-up as scheduled.    Thank you for choosing [facility] to provide your oncology and hematology care.   To afford each patient quality time with our provider, please arrive at least 15 minutes before your scheduled appointment time. You may need to reschedule your appointment if you arrive late (10 or more minutes). Arriving late affects you and other patients whose appointments are after yours.  Also, if you miss three or more appointments without notifying the office, you may be dismissed from the clinic at the provider's discretion.    Again, thank you for choosing [facility].  Our hope is that these requests will decrease the amount of time that you wait before being seen by our physicians.   If you have a lab appointment with the [facility] please come in thru the Main Entrance and check in at the main information desk.           _____________________________________________________________  Should you have questions after your visit to [facility], please contact our office at [phone] and follow the prompts.  Our office hours are 8:00 a.m. to 4:30 p.m. Monday - Thursday and 8:00 a.m. to 2:30 p.m. Friday.  Please note that voicemails left after 4:00 p.m. may not be returned until the following business day.  We are closed weekends and all major holidays.  You do have access to a nurse 24-7, just call the main number to the clinic [phone] and do not press any options, hold on the line and a nurse will answer the phone.    For  prescription refill requests, have your pharmacy contact our office and allow 72 hours.    Masks are optional in the cancer centers. If you would like for your care team to wear a mask while they are taking care of you, please let them know. You may have one support person who is at least 64 years old accompany you for your appointments.

## 2022-03-16 ENCOUNTER — Other Ambulatory Visit: Payer: Self-pay

## 2022-03-16 DIAGNOSIS — C61 Malignant neoplasm of prostate: Secondary | ICD-10-CM

## 2022-03-16 NOTE — Progress Notes (Signed)
Orders placed for NM Bone Scan and CT AP per Dr. Delton Coombes verbal order.

## 2022-03-21 ENCOUNTER — Other Ambulatory Visit: Payer: Self-pay

## 2022-03-21 MED ORDER — PREDNISONE 50 MG PO TABS: ORAL_TABLET | ORAL | 0 refills | Status: DC

## 2022-03-23 ENCOUNTER — Encounter (HOSPITAL_COMMUNITY): Payer: Self-pay

## 2022-03-23 ENCOUNTER — Encounter (HOSPITAL_COMMUNITY)
Admission: RE | Admit: 2022-03-23 | Discharge: 2022-03-23 | Disposition: A | Discharge status: Home / Self Care | Payer: BC Managed Care – PPO | Source: Ambulatory Visit | Attending: Hematology | Admitting: Hematology

## 2022-03-23 DIAGNOSIS — C61 Malignant neoplasm of prostate: Secondary | ICD-10-CM | POA: Diagnosis present

## 2022-03-23 MED ORDER — TECHNETIUM TC 99M MEDRONATE IV KIT
20.0000 | PACK | Freq: Once | INTRAVENOUS | Status: AC | PRN
  Administered 2022-03-23: 21 via INTRAVENOUS

## 2022-03-26 ENCOUNTER — Ambulatory Visit (HOSPITAL_COMMUNITY)
Admission: RE | Admit: 2022-03-26 | Discharge: 2022-03-26 | Disposition: A | Discharge status: Home / Self Care | Payer: BC Managed Care – PPO | Source: Ambulatory Visit | Attending: Hematology | Admitting: Hematology

## 2022-03-26 DIAGNOSIS — C61 Malignant neoplasm of prostate: Secondary | ICD-10-CM | POA: Insufficient documentation

## 2022-03-26 MED ORDER — IOHEXOL 300 MG/ML  SOLN
100.0000 mL | Freq: Once | INTRAMUSCULAR | Status: AC | PRN
  Administered 2022-03-26: 100 mL via INTRAVENOUS

## 2022-03-28 ENCOUNTER — Encounter: Payer: Self-pay | Admitting: Urology

## 2022-03-28 NOTE — Progress Notes (Signed)
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 I verified patient's identity and began nursing interview.  Patient reports lumbar, and bilateral hip/ thigh pain 5/10 on average, aided w/ the application of heat. No other issues reported at this time.  Meaningful use complete.  Patient aware of his 2:00pm-03/29/22 video visit w/ Ashlyn Bruning PA-C. I left my extension [phone] in case patient needs anything. Patient verbalized understanding.  This concludes the interview.   Leandra Kern, LPN

## 2022-03-29 ENCOUNTER — Encounter: Payer: Self-pay | Admitting: Urology

## 2022-03-29 ENCOUNTER — Ambulatory Visit
Admission: RE | Admit: 2022-03-29 | Discharge: 2022-03-29 | Disposition: A | Discharge status: Home / Self Care | Payer: BC Managed Care – PPO | Source: Ambulatory Visit | Attending: Urology | Admitting: Urology

## 2022-03-29 VITALS — Ht 66.0 in | Wt 174.0 lb

## 2022-03-29 DIAGNOSIS — C61 Malignant neoplasm of prostate: Secondary | ICD-10-CM

## 2022-03-29 NOTE — Progress Notes (Signed)
Radiation Oncology         [phone] ________________________________  Outpatient Re-Consult visit - Conducted via MyChart due to current COVID-19 concerns for limiting patient exposure  Name: Scott [name] MRN: [id number]  Date: 03/29/2022  DOB: 1955/10/18  KG:URKYH, Scott Right, MD  [name], MD   REFERRING PHYSICIAN: [name], MD  DIAGNOSIS: 67 y.o. 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    ICD-10-CM   1. Malignant neoplasm of prostate metastatic to bone [facility])  C61    C79.51       HISTORY OF PRESENT ILLNESS: Scott [name] is a 67 y.o. male with a diagnosis of metastatic castrate resistant prostate cancer. In summary, he was initially diagnosed with advanced prostate cancer with associated pelvic lymphadenopathy in October 2018 with a PSA of 159.8 at that time.  He presented to the ED on 12/18/2016 with a two week history of urinary retention and hematuria.  A catheter was placed at that time and he was referred for evaluation in urology by Dr. Tresa Moore on 12/27/2016,  digital rectal examination was performed revealing a "rock hard and fixed" prostate.  The patient proceeded to transrectal ultrasound with 12 biopsies of the prostate that same day.  The prostate volume measured 81 cc.  Out of 12 core biopsies, all 12 were positive.  The maximum Gleason score was 4+5, and this was seen in the left mid lateral, left base, and left mid, all with perineural invasion. Gleason 4+4 was seen in the remaining 9 cores, with perineural invasion identified in all cores except the [name] mid lateral and [name] mid. Extraprostatic extension was also seen in the left base lateral.  A CT A/P performed shortly after biopsy showed bilateral non-bulky iliac adenopathy. Bone scan performed on 01/01/2017 showed a single nonspecific  focus of increased tracer in the left orbit.  He proceeded to TURP on 01/25/2017 with final surgical pathology showing prostatic adenocarcinoma, Gleason score 4+5, involving 75% of tissue with perineural invasion present. He was started on Lupron ADT at that time.  He initially had an excellent response with PSA decreased to 2.86 in February 2019 and nadired at 1.87 in December 2019.  He received his second dose of Lupron in 07/2017, but this was stopped thereafter due to intolerable hot flashes, sleep deprivation, and decreased mental clarity. Dr. Jeffie Pollock took over his care in 01/2018. He was started on estradiol patch in 04/2018 for symptom management. His PSA subsequently rose to 6 in 07/2018, and he was placed back on the Lupron ADT with an initial response and decrease of the PSA to 4.37 in 08/2018 but again began rising shortly thereafter up to 4.64 in August 2020 and 6.46 in September 2020.   He underwent restaging scans on 11/26/2018 with CT C/A/P which confirmed no adenopathy or definite findings of active bony malignancy. There was nonspecific subtle sclerosis laterally in a [name] 6th rib but bone scan was negative for skeletal metastasis.  He was referred to Dr. Delton Coombes on 11/28/2018, who ordered Axumin PET scan. This was performed on 12/11/2018 and showed small subcentimeter periaortic lymph nodes and left external ilial lymph nodes, with only low-grade activity and favoring reactive nodes but there was asymmetric activity in the [name] lobe of the prostate gland without evidence of distant metastatic disease or skeletal metastasis.  His PSA continued to gradually rise despite resuming  the Lupron ADT with a PSA at 7.31 in 01/2019.  We met with the patient on 02/24/2019. At that time, he opted to defer radiation therapy and continue with ADT alone. He was seen by Dr. Jeffie Pollock on 04/10/19 and started Avodart. His PSA, however, continued to rise and reached 15.8 on 09/28/19. He underwent restaging CT A/P on  11/03/19, which revealed a mass in the posterior urinary bladder, a new sclerotic lesion in the left anterior vertebral body of L1, and increased mild left periaortic adenopathy. He was taken for debulking TURP on 11/10/19, with pathology confirming the presence of adenocarcinoma. He then received 6 cycles of docetaxel from 11/16/19 to 02/29/20 under the care of Dr. Delton Coombes.  His PSA decreased on the docetaxel, to a nadir of 2.17 on 03/22/20. Unfortunately, this increased back up to 4.77 in 04/2020 and 12.9 on 05/26/20. He was complaining of progressive low back pain so he proceeded to restaging CT A/P on 06/03/20 showing a slight increase in size of left retroperitoneal and left external iliac lymph nodes with progressive sclerosis involving the L2 vertebral body and a new area of sclerosis involving the anterior aspect of the L1 vertebral body. This was followed by a bone scan on 06/14/20 also confirming progressive bony metastatic disease, with increased size and intensity of activity at L2 and a new focus of activity at L1. PSA was further increased at 18.81 on 06/16/20 so he was started on Zytiga, with his first dose on 06/23/20. We met back with the patient on 07/19/20 and he elected to proceed with palliative radiation to the painful sites of osseous metastases at L1 and L2, completed 08/12/20. He did experience significant pain relief with this treatment and has continued in close follow up with Dr. Delton Coombes and Dr. Jeffie Pollock. His most recent treatment since 10/2021 has been Daralutamide and Eligard as well as dutasteride and estradiol patches for symptom management.  Unfortunately, his PSA has continued to rise despite treatment with a PSA at 3.58 in June 2023, increasing to 9.84 in August 2023 and 10.6 in October 2023.  He has also been complaining of progressive low back pain so restaging scans were performed for further evaluation.  The bone scan from 03/23/2022 shows progression of disease at L2 (previously treated)  and a new lesion at L4.  A CT A/P was performed on 03/26/2022 and showed increased size of a periaortic node, new pulmonary nodules and a new bony mets at L4.  At the time of his last follow-up visit with Dr. Delton Coombes, they discussed consideration for Pluvicto in addition to palliative radiation for symptom management. They are also updating his molecular studies with a Guardant360 and/or tissue biopsy to determine if he is a candidate for other immunotherapies or targeted therapies.  The patient has kindly been referred today for discussion of potential palliative radiation treatment options.   PREVIOUS RADIATION THERAPY: Yes 5/16-5/27/22:  The spine from T12 to L3 inclusive received 30 Gy in 10 fractions   PAST MEDICAL HISTORY:  Past Medical History:  Diagnosis Date   Bladder obstruction    Complication of anesthesia    Diverticulitis 2015   Dyspnea    Essential hypertension, benign 01/26/2019   at [place] office elevated, normal at home, no medications at this time   History of basal cell cancer    HLD (hyperlipidemia) 01/26/2019   Hypothyroidism    history of, not currently taking medication   PONV (postoperative nausea and vomiting)    Prostate cancer ([place])  metastatic to bones   Vitamin D deficiency disease 01/26/2019      PAST SURGICAL HISTORY: Past Surgical History:  Procedure Laterality Date   CYSTOSCOPY W/ URETERAL STENT PLACEMENT Left 11/10/2019   Procedure: CYSTOSCOPY;  Surgeon: Irine Seal, MD;  Location: [facility];  Service: Urology;  Laterality: Left;   ESOPHAGOGASTRODUODENOSCOPY (EGD) WITH PROPOFOL N/A 07/07/2020   Procedure: ESOPHAGOGASTRODUODENOSCOPY (EGD) WITH PROPOFOL;  Surgeon: Daneil Dolin, MD;  Location: [facility];  Service: Endoscopy;  Laterality: N/A;  pm appt   HERNIA REPAIR  1740   UMBILICAL    MALONEY DILATION N/A 07/07/2020   Procedure: MALONEY DILATION;  Surgeon: Daneil Dolin, MD;  Location: [facility];  Service: Endoscopy;  Laterality: N/A;    PROSTATE SURGERY     SKIN CANCER EXCISION     TONSILLECTOMY Bilateral    TOOTH EXTRACTION     front left tooth pulled but no surgery or stitches   TRANSURETHRAL RESECTION OF PROSTATE N/A 01/25/2017   Procedure: TRANSURETHRAL RESECTION OF THE PROSTATE (TURP);  Surgeon: Alexis Frock, MD;  Location: [facility];  Service: Urology;  Laterality: N/A;   TRANSURETHRAL RESECTION OF PROSTATE N/A 11/10/2019   Procedure: TRANSURETHRAL RESECTION OF THE PROSTATE (TURP);  Surgeon: Irine Seal, MD;  Location: [facility];  Service: Urology;  Laterality: N/A;   WISDOM TOOTH EXTRACTION      FAMILY HISTORY:  Family History  Problem Relation Age of Onset   Emphysema Mother    Alzheimer's disease Father    Prostate cancer Father        dx in his 42s   Prostate cancer Brother        dx in his 39s   Breast cancer Maternal Grandmother        82s   Pancreatic cancer Maternal Grandfather        21s   Dementia Paternal Grandmother    Heart attack Paternal Grandfather    Colon cancer Neg Hx     SOCIAL HISTORY:  Social History   Socioeconomic History   Marital status: Married    Spouse name: Not on file   Number of children: 1   Years of education: Not on file   Highest education level: Not on file  Occupational History    Comment: working full time  Tobacco Use   Smoking status: Former    Packs/day: 1.50    Years: 35.00    Total pack years: 52.50    Types: Cigarettes    Quit date: 01/12/2004    Years since quitting: 18.2   Smokeless tobacco: Never   Tobacco comments:    OVER 30 YEARS HX OF SMOKING   Vaping Use   Vaping Use: Never used  Substance and Sexual Activity   Alcohol use: Not Currently    Comment: heavy drinker until 2004   Drug use: Not Currently    Types: Marijuana   Sexual activity: Not Currently  Other Topics Concern   Not on file  Social History Narrative   Married for 19 years.Works for [employer] from home.   Social Determinants of Health   Financial Resource  Strain: Low Risk  (03/02/2020)   Overall Financial Resource Strain (CARDIA)    Difficulty of Paying Living Expenses: Not hard at all  Food Insecurity: No Food Insecurity (03/02/2020)   Hunger Vital Sign    Worried About Running Out of Food in the Last Year: Never true    Ran Out of Food in the Last Year: Never  true  Transportation Needs: No Transportation Needs (03/02/2020)   PRAPARE - Hydrologist (Medical): No    Lack of Transportation (Non-Medical): No  Physical Activity: Inactive (03/02/2020)   Exercise Vital Sign    Days of Exercise per Week: 0 days    Minutes of Exercise per Session: 0 min  Stress: No Stress Concern Present (03/02/2020)   [place]    Feeling of Stress : Only a little  Social Connections: Moderately Integrated (03/02/2020)   Social Connection and Isolation Panel [NHANES]    Frequency of Communication with Friends and Family: Three times a week    Frequency of Social Gatherings with Friends and Family: Never    Attends Religious Services: Never    Marine scientist or Organizations: Yes    Attends Archivist Meetings: 1 to 4 times per year    Marital Status: Married  Human resources officer Violence: Not At Risk (03/02/2020)   Humiliation, Afraid, Rape, and Kick questionnaire    Fear of Current or Ex-Partner: No    Emotionally Abused: No    Physically Abused: No    Sexually Abused: No    ALLERGIES: Crestor [rosuvastatin], Ivp dye [iodinated contrast media], and Wellbutrin [bupropion]  MEDICATIONS:  Current Outpatient Medications  Medication Sig Dispense Refill   arginine 500 MG tablet Take 500 mg by mouth 2 (two) times daily.     Cholecalciferol 125 MCG (5000 UT) capsule Take 5,000-10,000 Units by mouth See admin instructions. Take 10000 units in the morning and 5000 units at night     DANDELION ROOT PO Take 500 mg by mouth daily.     dutasteride  (AVODART) 0.5 MG capsule Take 1 capsule (0.5 mg total) by mouth daily. 90 capsule 3   estradiol (VIVELLE-DOT) 0.1 MG/24HR patch APPLY 1 PATCH(0.1 MG) TOPICALLY TO THE SKIN 2 TIMES A WEEK 8 patch 12   ibuprofen (ADVIL) 200 MG tablet Take 400 mg by mouth every 6 (six) hours as needed for moderate pain.     MAGNESIUM PO Take 1 tablet by mouth at bedtime.     MELATONIN PO Take 40 mg by mouth at bedtime.     MILK THISTLE PO Take 1 capsule by mouth daily.     NATTOKINASE PO Take 2 capsules by mouth daily.     NP THYROID 120 MG tablet Take 0.5 tablets (60 mg total) by mouth every morning. 45 tablet 1   NUBEQA 300 MG tablet TAKE 2 TABLETS TWICE DAILY WITH A MEAL 120 tablet 3   olmesartan (BENICAR) 20 MG tablet TAKE 1 TABLET(20 MG) BY MOUTH DAILY 90 tablet 0   ondansetron (ZOFRAN ODT) 8 MG disintegrating tablet Take 1 tablet (8 mg total) by mouth every 8 (eight) hours as needed for nausea or vomiting. 60 tablet 3   predniSONE (DELTASONE) 5 MG tablet Take 5 mg by mouth daily.     predniSONE (DELTASONE) 50 MG tablet Take 13 hours, 7 hours and 1 hour prior to the CT scan. 3 tablet 0   QUERCETIN PO Take 1 tablet by mouth 2 (two) times daily.     thiamine (VITAMIN B-1) 50 MG tablet Take 1 tablet (50 mg total) by mouth daily. 90 tablet 1   traMADol HCl 100 MG TABS Take 100 mg by mouth at bedtime as needed. 60 tablet 0   VITAMIN K PO Take 1 capsule by mouth every other day.  Zinc 30 MG CAPS Take 30 mg by mouth daily.     No current facility-administered medications for this encounter.    REVIEW OF SYSTEMS:  On review of systems, the patient reports that he is doing well overall. He denies any chest pain, shortness of breath, cough, fevers, chills, night sweats, or recent unintended weight changes. He denies any bowel disturbances, and denies abdominal pain, nausea or vomiting. He is having progressive low back pain that radiates into the hips/thighs bilaterally but he denies any focal weakness,  paraesthesias or other new musculoskeletal or joint aches or pains. His IPSS was 23, indicating severe urinary symptoms. He reports occasional leakage related to urinary urgency and intermittent post-void dribble His SHIM was 1, indicating he has severe erectile dysfunction. A complete review of systems is obtained and is otherwise negative.    PHYSICAL EXAM:  Wt Readings from Last 3 Encounters:  03/28/22 174 lb (78.9 kg)  03/15/22 175 lb 1.6 oz (79.4 kg)  02/22/22 176 lb (79.8 kg)   Temp Readings from Last 3 Encounters:  03/15/22 98 F (36.7 C) (Oral)  02/22/22 98.2 F (36.8 C) (Oral)  12/20/21 99.1 F (37.3 C) (Oral)   BP Readings from Last 3 Encounters:  03/15/22 125/79  02/22/22 136/70  12/20/21 138/79   Pulse Readings from Last 3 Encounters:  03/15/22 77  02/22/22 (!) 55  12/20/21 (!) 55   Pain Assessment Pain Score: 5  (Lower spine, bilateral hips, thighs)/10  In general this is a well appearing Caucasian gentleman in no acute distress. He's alert and oriented x4 and appropriate throughout the examination. Cardiopulmonary assessment is negative for acute distress and he exhibits normal effort.   KPS = 90  100 - Normal; no complaints; no evidence of disease. 90   - Able to carry on normal activity; minor signs or symptoms of disease. 80   - Normal activity with effort; some signs or symptoms of disease. 37   - Cares for self; unable to carry on normal activity or to do active work. 60   - Requires occasional assistance, but is able to care for most of his personal needs. 50   - Requires considerable assistance and frequent medical care. 66   - Disabled; requires special care and assistance. 31   - Severely disabled; hospital admission is indicated although death not imminent. 41   - Very sick; hospital admission necessary; active supportive treatment necessary. 10   - Moribund; fatal processes progressing rapidly. 0     - Dead  Karnofsky DA, Abelmann Kell, Craver LS  and Burchenal [facility] [phone]) The use of the nitrogen mustards in the palliative treatment of carcinoma: with particular reference to bronchogenic carcinoma Cancer 1 634-56  LABORATORY DATA:  Lab Results  Component Value Date   WBC 6.4 03/08/2022   HGB 12.6 (L) 03/08/2022   HCT 37.1 (L) 03/08/2022   MCV 92.8 03/08/2022   PLT 221 03/08/2022   Lab Results  Component Value Date   NA 131 (L) 03/08/2022   K 4.2 03/08/2022   CL 101 03/08/2022   CO2 22 03/08/2022   Lab Results  Component Value Date   ALT 14 03/08/2022   AST 16 03/08/2022   ALKPHOS 73 03/08/2022   BILITOT 1.5 (H) 03/08/2022     RADIOGRAPHY: CT Abdomen Pelvis W Contrast  Result Date: 03/26/2022 CLINICAL DATA:  Follow-up metastatic prostate carcinoma. * Tracking Code: BO * EXAM: CT ABDOMEN AND PELVIS WITH CONTRAST TECHNIQUE: Multidetector CT imaging of the  abdomen and pelvis was performed using the standard protocol following bolus administration of intravenous contrast. RADIATION DOSE REDUCTION: This exam was performed according to the departmental dose-optimization program which includes automated exposure control, adjustment of the mA and/or kV according to patient size and/or use of iterative reconstruction technique. CONTRAST:  160m OMNIPAQUE IOHEXOL 300 MG/ML  SOLN COMPARISON:  PET-CT on 09/28/2021 FINDINGS: Lower Chest: Few tiny sub-cm pulmonary nodules are seen in both lower lobes, largest in the medial left lower lobe measuring 8 mm on image 9/6. These were not definitely seen on prior studies, and raise suspicion for pulmonary metastases. Hepatobiliary: No hepatic masses identified. Gallbladder is unremarkable. No evidence of biliary ductal dilatation. Pancreas:  No mass or inflammatory changes. Spleen: Within normal limits in size and appearance. Adrenals/Urinary Tract: No suspicious masses identified. No evidence of ureteral calculi or hydronephrosis. Stomach/Bowel: No evidence of obstruction, inflammatory process or abnormal  fluid collections. Vascular/Lymphatic: 8 mm left para-aortic lymph node on image 40/2 is increased in size since previous study, suspicious for metastatic disease. Sub-centimeter bilateral external iliac lymph nodes remains stable. Aortic atherosclerotic calcification incidentally noted. No acute vascular findings. Reproductive: Small prostate gland with prior TURP defect again noted. Other: Stable paraumbilical ventral hernia again seen, which contains only fat. Musculoskeletal: Stable appearance of sclerotic bone metastases involving the L1 and L2 vertebra. New sclerotic lesion seen in the L4 vertebral body, consistent with bone metastasis. IMPRESSION: Increased size of 8 mm left para-aortic lymph node, suspicious for metastatic disease. New sub-cm pulmonary nodules in both lower lobes. Pulmonary metastases cannot be excluded. Chest CT is recommended for further evaluation. New sclerotic bone metastasis in L4 vertebral body. Stable sclerotic bone metastases involving L1 and L2 vertebra. Stable paraumbilical ventral hernia, which contains only fat. Aortic Atherosclerosis (ICD10-I70.0). Electronically Signed   By: JMarlaine HindM.D.   On: 03/26/2022 16:10   NM Bone Scan Whole Body  Result Date: 03/23/2022 CLINICAL DATA:  High-risk prostate cancer, staging, patient with low back and BILATERAL hip/thigh pain, no history of trauma EXAM: NUCLEAR MEDICINE WHOLE BODY BONE SCAN TECHNIQUE: Whole body anterior and posterior images were obtained approximately 3 hours after intravenous injection of radiopharmaceutical. RADIOPHARMACEUTICALS:  21 mCi Technetium-91mDP IV COMPARISON:  06/14/2020 Correlation: PET-CT 09/28/2021 FINDINGS: Increased tracer uptake in lumbar spine at L1, L2, and L4. Uptake at both L2 and L4 is progressive since the previous exam while uptake at L1 is little changed. No additional sites of concerning abnormal tracer accumulation are identified. Minimal degenerative uptake of tracer at shoulders,  sternoclavicular joints, LEFT knee, LEFT foot. Expected urinary tract and soft tissue distribution of tracer. 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 Electronically Signed   By: MaLavonia Dana.D.   On: 03/23/2022 14:59      IMPRESSION/PLAN: This visit was conducted via MyChart to spare the patient unnecessary potential exposure in the healthcare setting during the current COVID-19 pandemic. 1. [age].o. 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 Today, we talked to the patient about the findings and workup thus far. He has had prior palliative radiation so he is familiar with the natural history of prostate cancer carcinoma and the role of palliative radiotherapy in the management of painful osseous metastases. We reviewed the available radiation techniques, and focused on the details and  logistics of delivery. The recommendation is for a 2 week course of daily palliaitve radiotherapy to the painful lesion(s) at L4 and L2. We reviewed the anticipated acute and late sequelae associated with radiation in this setting. He appears to have a good understanding of his disease and our treatment recommendations which are of palliative intent. The patient was encouraged to ask questions that were answered to his stated satisfaction.  At the end of our conversation, the patient would like to proceed with the recommended 2 week course of daily palliative radiation therapy to the new disease at L4 and re-treatment of L2 pending Dr. Delton Coombes is in agreement. He has provided verbal consent to proceed today and in favor of tentatively scheduling his CT SIM later next week in anticipation of beginning his daily treatments in the near future. He has a follow up visit with Dr. Delton Coombes on  Wednesday 04/04/21 and pending he is in agreement with this plan, he will proceed with treatment planning thereafter. We will share our discussion with Dr. Delton Coombes and Dr. Jeffie Pollock and move forward with treatment planning accordingly.   Given current concerns for patient exposure during the COVID-19 pandemic, this encounter was conducted via video-enabled MyChart visit. The patient has given verbal consent for this type of encounter. The attendants for this meeting include Tyler Pita MD, Ashlyn Bruning PA-C, and patient, Scott [name]. During the encounter, Tyler Pita MD, and Andi Devon, were located at [facility] Radiation Oncology Department.  Patient, Scott [name] was located at home.  We personally spent 45 minutes in this encounter including chart review, reviewing radiological studies, meeting face-to-face with the patient, entering orders and completing documentation.     Nicholos Johns, PA-C    Tyler Pita, MD  [facility] Direct Dial: [phone]  Fax: [phone] [email or web address]  Skype  LinkedIn

## 2022-03-30 ENCOUNTER — Other Ambulatory Visit: Payer: Self-pay | Admitting: Internal Medicine

## 2022-03-30 DIAGNOSIS — I10 Essential (primary) hypertension: Secondary | ICD-10-CM

## 2022-04-03 ENCOUNTER — Ambulatory Visit: Payer: BC Managed Care – PPO

## 2022-04-03 ENCOUNTER — Other Ambulatory Visit: Payer: BC Managed Care – PPO

## 2022-04-03 ENCOUNTER — Ambulatory Visit: Payer: BC Managed Care – PPO | Admitting: Hematology

## 2022-04-04 ENCOUNTER — Inpatient Hospital Stay: Payer: BC Managed Care – PPO | Attending: Hematology | Admitting: Hematology

## 2022-04-04 ENCOUNTER — Ambulatory Visit: Payer: BC Managed Care – PPO | Admitting: Hematology

## 2022-04-04 ENCOUNTER — Encounter: Payer: Self-pay | Admitting: General Practice

## 2022-04-04 VITALS — BP 124/74 | HR 57 | Temp 98.2°F | Resp 16 | Wt 175.8 lb

## 2022-04-04 DIAGNOSIS — M549 Dorsalgia, unspecified: Secondary | ICD-10-CM | POA: Diagnosis not present

## 2022-04-04 DIAGNOSIS — Z803 Family history of malignant neoplasm of breast: Secondary | ICD-10-CM | POA: Diagnosis not present

## 2022-04-04 DIAGNOSIS — I10 Essential (primary) hypertension: Secondary | ICD-10-CM | POA: Diagnosis not present

## 2022-04-04 DIAGNOSIS — C61 Malignant neoplasm of prostate: Secondary | ICD-10-CM

## 2022-04-04 DIAGNOSIS — Z85828 Personal history of other malignant neoplasm of skin: Secondary | ICD-10-CM | POA: Insufficient documentation

## 2022-04-04 DIAGNOSIS — Z800 Family history of malignant neoplasm of digestive organs: Secondary | ICD-10-CM | POA: Insufficient documentation

## 2022-04-04 DIAGNOSIS — C7951 Secondary malignant neoplasm of bone: Secondary | ICD-10-CM | POA: Diagnosis not present

## 2022-04-04 DIAGNOSIS — Z8042 Family history of malignant neoplasm of prostate: Secondary | ICD-10-CM | POA: Insufficient documentation

## 2022-04-04 DIAGNOSIS — M25551 Pain in right hip: Secondary | ICD-10-CM | POA: Diagnosis not present

## 2022-04-04 DIAGNOSIS — M25552 Pain in left hip: Secondary | ICD-10-CM | POA: Diagnosis not present

## 2022-04-04 DIAGNOSIS — N1330 Unspecified hydronephrosis: Secondary | ICD-10-CM | POA: Diagnosis not present

## 2022-04-04 DIAGNOSIS — Z87891 Personal history of nicotine dependence: Secondary | ICD-10-CM | POA: Diagnosis not present

## 2022-04-04 DIAGNOSIS — G629 Polyneuropathy, unspecified: Secondary | ICD-10-CM | POA: Insufficient documentation

## 2022-04-04 NOTE — Progress Notes (Signed)
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 TURP on 11/10/2019. 2. Docetaxel x 6 cycles from 11/16/2019 to 02/29/2020. 3.  Abiraterone and prednisone  NGS Results: not done  CURRENT THERAPY: Darolutamide 600 mg twice daily  BRIEF ONCOLOGIC HISTORY:  Oncology History  Prostate cancer metastatic to multiple sites [facility])  01/25/2017 Initial Diagnosis   Prostate cancer metastatic to multiple sites [facility])   11/16/2019 - 03/02/2020 Chemotherapy   Patient is on Treatment Plan : PROSTATE Docetaxel + Prednisone q21d      Genetic Testing   Negative genetic testing. No pathogenic variants identified on the [facility] CancerNext-Expanded panel. 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 The report date is 04/19/2020.  The CancerNext-Expanded  gene panel offered by [facility] and includes sequencing and rearrangement analysis for the following 77 genes: IP, ALK, APC*, ATM*, AXIN2, BAP1, BARD1, BLM, BMPR1A, BRCA1*, BRCA2*, BRIP1*, CDC73, CDH1*,CDK4, CDKN1B, CDKN2A, CHEK2*, CTNNA1, DICER1, FANCC, FH, FLCN, GALNT12, KIF1B, LZTR1, MAX, MEN1, MET, MLH1*, MSH2*, MSH3, MSH6*, MUTYH*, NBN, NF1*, NF2, NTHL1, PALB2*, PHOX2B, PMS2*, POT1, PRKAR1A, PTCH1, PTEN*, RAD51C*, RAD51D*,RB1, RECQL, RET, SDHA, SDHAF2, SDHB, SDHC, SDHD, SMAD4, SMARCA4, SMARCB1, SMARCE1, STK11, SUFU, TMEM127, TP53*,TSC1, TSC2, VHL and XRCC2 (sequencing and deletion/duplication); EGFR, EGLN1, HOXB13, KIT, MITF, PDGFRA, POLD1 and POLE (sequencing only); EPCAM and GREM1 (deletion/duplication only).      CANCER STAGING:  Cancer Staging  No matching staging information was found for the patient.  INTERVAL HISTORY:  Scott [name], a 67 y.o. male, seen for follow-up of metastatic  prostate cancer to the bones.  He reports energy levels of 40 to 50%.  He reports back pain is stable.  He is taking ibuprofen during daytime and tramadol at nighttime.  He is also applying heating pad for most part of the day.  REVIEW OF SYSTEMS:  Review of Systems  Respiratory:  Positive for shortness of breath.   Gastrointestinal:  Positive for nausea and vomiting.  Musculoskeletal:  Positive for arthralgias (2/10 hips) and back pain (2/10).  Neurological:  Negative for light-headedness and numbness.  Psychiatric/Behavioral:  Positive for sleep disturbance.   All other systems reviewed and are negative.   PAST MEDICAL/SURGICAL HISTORY:  Past Medical History:  Diagnosis Date   Bladder obstruction    Complication of anesthesia    Diverticulitis 2015   Dyspnea    Essential hypertension, benign 01/26/2019   at [place] office elevated, normal at home, no medications at this time   History of basal cell cancer    HLD (hyperlipidemia) 01/26/2019   Hypothyroidism    history of, not currently taking medication   PONV (postoperative nausea and vomiting)    Prostate cancer ([place])    metastatic to bones   Vitamin D deficiency disease 01/26/2019   Past Surgical History:  Procedure Laterality Date   CYSTOSCOPY W/ URETERAL STENT PLACEMENT Left 11/10/2019   Procedure: CYSTOSCOPY;  Surgeon: Irine Seal, MD;  Location: [facility];  Service: Urology;  Laterality: Left;   ESOPHAGOGASTRODUODENOSCOPY (EGD) WITH PROPOFOL N/A 07/07/2020   Procedure: ESOPHAGOGASTRODUODENOSCOPY (EGD) WITH PROPOFOL;  Surgeon: Daneil Dolin, MD;  Location[facility];  Service: Endoscopy;  Laterality: N/A;  pm appt   HERNIA REPAIR  [id number]   UMBILICAL    MALONEY DILATION N/A  07/07/2020   Procedure: Venia Minks DILATION;  Surgeon: Daneil Dolin, MD;  Location: [facility];  Service: Endoscopy;  Laterality: N/A;   PROSTATE SURGERY     SKIN CANCER EXCISION     TONSILLECTOMY Bilateral    TOOTH EXTRACTION     front left tooth  pulled but no surgery or stitches   TRANSURETHRAL RESECTION OF PROSTATE N/A 01/25/2017   Procedure: TRANSURETHRAL RESECTION OF THE PROSTATE (TURP);  Surgeon: Alexis Frock, MD;  Location: [facility];  Service: Urology;  Laterality: N/A;   TRANSURETHRAL RESECTION OF PROSTATE N/A 11/10/2019   Procedure: TRANSURETHRAL RESECTION OF THE PROSTATE (TURP);  Surgeon: Irine Seal, MD;  Location: [facility];  Service: Urology;  Laterality: N/A;   WISDOM TOOTH EXTRACTION      SOCIAL HISTORY:  Social History   Socioeconomic History   Marital status: Married    Spouse name: Not on file   Number of children: 1   Years of education: Not on file   Highest education level: Not on file  Occupational History    Comment: working full time  Tobacco Use   Smoking status: Former    Packs/day: 1.50    Years: 35.00    Total pack years: 52.50    Types: Cigarettes    Quit date: 01/12/2004    Years since quitting: 18.2   Smokeless tobacco: Never   Tobacco comments:    OVER 30 YEARS HX OF SMOKING   Vaping Use   Vaping Use: Never used  Substance and Sexual Activity   Alcohol use: Not Currently    Comment: heavy drinker until 2004   Drug use: Not Currently    Types: Marijuana   Sexual activity: Not Currently  Other Topics Concern   Not on file  Social History Narrative   Married for 19 years.Works for [employer] from home.   Social Determinants of Health   Financial Resource Strain: Low Risk  (03/02/2020)   Overall Financial Resource Strain (CARDIA)    Difficulty of Paying Living Expenses: Not hard at all  Food Insecurity: No Food Insecurity (03/02/2020)   Hunger Vital Sign    Worried About Running Out of Food in the Last Year: Never true    Ran Out of Food in the Last Year: Never true  Transportation Needs: No Transportation Needs (03/02/2020)   PRAPARE - Hydrologist (Medical): No    Lack of Transportation (Non-Medical): No  Physical Activity: Inactive  (03/02/2020)   Exercise Vital Sign    Days of Exercise per Week: 0 days    Minutes of Exercise per Session: 0 min  Stress: No Stress Concern Present (03/02/2020)   [place]    Feeling of Stress : Only a little  Social Connections: Moderately Integrated (03/02/2020)   Social Connection and Isolation Panel [NHANES]    Frequency of Communication with Friends and Family: Three times a week    Frequency of Social Gatherings with Friends and Family: Never    Attends Religious Services: Never    Marine scientist or Organizations: Yes    Attends Archivist Meetings: 1 to 4 times per year    Marital Status: Married  Human resources officer Violence: Not At Risk (03/02/2020)   Humiliation, Afraid, Rape, and Kick questionnaire    Fear of Current or Ex-Partner: No    Emotionally Abused: No    Physically Abused: No    Sexually Abused: No  FAMILY HISTORY:  Family History  Problem Relation Age of Onset   Emphysema Mother    Alzheimer's disease Father    Prostate cancer Father        dx in his 78s   Prostate cancer Brother        dx in his 69s   Breast cancer Maternal Grandmother        45s   Pancreatic cancer Maternal Grandfather        71s   Dementia Paternal Grandmother    Heart attack Paternal Grandfather    Colon cancer Neg Hx     CURRENT MEDICATIONS:  Current Outpatient Medications  Medication Sig Dispense Refill   arginine 500 MG tablet Take 500 mg by mouth 2 (two) times daily.     Cholecalciferol 125 MCG (5000 UT) capsule Take 5,000-10,000 Units by mouth See admin instructions. Take 10000 units in the morning and 5000 units at night     DANDELION ROOT PO Take 500 mg by mouth daily.     dutasteride (AVODART) 0.5 MG capsule Take 1 capsule (0.5 mg total) by mouth daily. 90 capsule 3   estradiol (VIVELLE-DOT) 0.1 MG/24HR patch APPLY 1 PATCH(0.1 MG) TOPICALLY TO THE SKIN 2 TIMES A WEEK 8 patch 12    ibuprofen (ADVIL) 200 MG tablet Take 400 mg by mouth every 6 (six) hours as needed for moderate pain.     MAGNESIUM PO Take 1 tablet by mouth at bedtime.     MELATONIN PO Take 40 mg by mouth at bedtime.     MILK THISTLE PO Take 1 capsule by mouth daily.     NATTOKINASE PO Take 2 capsules by mouth daily.     NP THYROID 120 MG tablet Take 0.5 tablets (60 mg total) by mouth every morning. 45 tablet 1   olmesartan (BENICAR) 20 MG tablet TAKE 1 TABLET(20 MG) BY MOUTH DAILY 90 tablet 1   ondansetron (ZOFRAN ODT) 8 MG disintegrating tablet Take 1 tablet (8 mg total) by mouth every 8 (eight) hours as needed for nausea or vomiting. 60 tablet 3   predniSONE (DELTASONE) 5 MG tablet Take 5 mg by mouth daily.     QUERCETIN PO Take 1 tablet by mouth 2 (two) times daily.     thiamine (VITAMIN B-1) 50 MG tablet Take 1 tablet (50 mg total) by mouth daily. 90 tablet 1   traMADol HCl 100 MG TABS Take 100 mg by mouth at bedtime as needed. 60 tablet 0   VITAMIN K PO Take 1 capsule by mouth every other day.     Zinc 30 MG CAPS Take 30 mg by mouth daily.     No current facility-administered medications for this visit.    ALLERGIES:  Allergies  Allergen Reactions   Crestor [Rosuvastatin] Other (See Comments)    myalgia   Ivp Dye [Iodinated Contrast Media] Swelling    Swelling on head, took Benadryl and swelling reduced   Wellbutrin [Bupropion] Hives    PHYSICAL EXAM:  Performance status (ECOG): 0 - Asymptomatic  Vitals:   04/04/22 1123  BP: 124/74  Pulse: (!) 57  Resp: 16  Temp: 98.2 F (36.8 C)  SpO2: 98%   Wt Readings from Last 3 Encounters:  04/04/22 175 lb 12.8 oz (79.7 kg)  03/28/22 174 lb (78.9 kg)  03/15/22 175 lb 1.6 oz (79.4 kg)   Physical Exam Vitals reviewed.  Constitutional:      Appearance: Normal appearance. He is obese.  Cardiovascular:  Rate and Rhythm: Normal rate and regular rhythm.     Pulses: Normal pulses.     Heart sounds: Normal heart sounds.  Pulmonary:      Effort: Pulmonary effort is normal.     Breath sounds: Normal breath sounds.  Neurological:     General: No focal deficit present.     Mental Status: He is alert and oriented to person, place, and time.  Psychiatric:        Mood and Affect: Mood normal.        Behavior: Behavior normal.      LABORATORY DATA:  I have reviewed the labs as listed.     Latest Ref Rng & Units 03/08/2022    2:00 PM 12/13/2021   11:08 AM 11/01/2021    9:45 AM  CBC  WBC 4.0 - 10.5 K/uL 6.4  4.7  4.1   Hemoglobin 13.0 - 17.0 g/dL 12.6  13.4  13.0   Hematocrit 39.0 - 52.0 % 37.1  38.1  36.2   Platelets 150 - 400 K/uL 221  219  228       Latest Ref Rng & Units 03/08/2022    2:00 PM 02/22/2022    1:53 PM 12/13/2021   11:08 AM  CMP  Glucose 70 - 99 mg/dL 103  110  122   BUN 8 - 23 mg/dL '16  15  11   '$ Creatinine 0.61 - 1.24 mg/dL 0.93  1.04  0.98   Sodium 135 - 145 mmol/L 131  134  133   Potassium 3.5 - 5.1 mmol/L 4.2  4.3  4.5   Chloride 98 - 111 mmol/L 101  101  102   CO2 22 - 32 mmol/L '22  26  22   '$ Calcium 8.9 - 10.3 mg/dL 9.1  9.4  9.2   Total Protein 6.5 - 8.1 g/dL 6.8   7.1   Total Bilirubin 0.3 - 1.2 mg/dL 1.5   1.5   Alkaline Phos 38 - 126 U/L 73   64   AST 15 - 41 U/L 16   18   ALT 0 - 44 U/L 14   18     DIAGNOSTIC IMAGING:  I have independently reviewed the scans and discussed with the patient. CT Abdomen Pelvis W Contrast  Result Date: 03/26/2022 CLINICAL DATA:  Follow-up metastatic prostate carcinoma. * Tracking Code: BO * EXAM: CT ABDOMEN AND PELVIS WITH CONTRAST TECHNIQUE: Multidetector CT imaging of the abdomen and pelvis was performed using the standard protocol following bolus administration of intravenous contrast. RADIATION DOSE REDUCTION: This exam was performed according to the departmental dose-optimization program which includes automated exposure control, adjustment of the mA and/or kV according to patient size and/or use of iterative reconstruction technique. CONTRAST:  119m  OMNIPAQUE IOHEXOL 300 MG/ML  SOLN COMPARISON:  PET-CT on 09/28/2021 FINDINGS: Lower Chest: Few tiny sub-cm pulmonary nodules are seen in both lower lobes, largest in the medial left lower lobe measuring 8 mm on image 9/6. These were not definitely seen on prior studies, and raise suspicion for pulmonary metastases. Hepatobiliary: No hepatic masses identified. Gallbladder is unremarkable. No evidence of biliary ductal dilatation. Pancreas:  No mass or inflammatory changes. Spleen: Within normal limits in size and appearance. Adrenals/Urinary Tract: No suspicious masses identified. No evidence of ureteral calculi or hydronephrosis. Stomach/Bowel: No evidence of obstruction, inflammatory process or abnormal fluid collections. Vascular/Lymphatic: 8 mm left para-aortic lymph node on image 40/2 is increased in size since previous study, suspicious for metastatic disease.  Sub-centimeter bilateral external iliac lymph nodes remains stable. Aortic atherosclerotic calcification incidentally noted. No acute vascular findings. Reproductive: Small prostate gland with prior TURP defect again noted. Other: Stable paraumbilical ventral hernia again seen, which contains only fat. Musculoskeletal: Stable appearance of sclerotic bone metastases involving the L1 and L2 vertebra. New sclerotic lesion seen in the L4 vertebral body, consistent with bone metastasis. IMPRESSION: Increased size of 8 mm left para-aortic lymph node, suspicious for metastatic disease. New sub-cm pulmonary nodules in both lower lobes. Pulmonary metastases cannot be excluded. Chest CT is recommended for further evaluation. New sclerotic bone metastasis in L4 vertebral body. Stable sclerotic bone metastases involving L1 and L2 vertebra. Stable paraumbilical ventral hernia, which contains only fat. Aortic Atherosclerosis (ICD10-I70.0). Electronically Signed   By: Marlaine Hind M.D.   On: 03/26/2022 16:10   NM Bone Scan Whole Body  Result Date: 03/23/2022 CLINICAL  DATA:  High-risk prostate cancer, staging, patient with low back and BILATERAL hip/thigh pain, no history of trauma EXAM: NUCLEAR MEDICINE WHOLE BODY BONE SCAN TECHNIQUE: Whole body anterior and posterior images were obtained approximately 3 hours after intravenous injection of radiopharmaceutical. RADIOPHARMACEUTICALS:  21 mCi Technetium-14mMDP IV COMPARISON:  06/14/2020 Correlation: PET-CT 09/28/2021 FINDINGS: Increased tracer uptake in lumbar spine at L1, L2, and L4. Uptake at both L2 and L4 is progressive since the previous exam while uptake at L1 is little changed. No additional sites of concerning abnormal tracer accumulation are identified. Minimal degenerative uptake of tracer at shoulders, sternoclavicular joints, LEFT knee, LEFT foot. Expected urinary tract and soft tissue distribution of tracer. 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 Electronically Signed   By: MLavonia DanaM.D.   On: 03/23/2022 14:59     ASSESSMENT:  1.  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  Prominent left hydronephrosis and hydroureter noted.  New sclerotic lesion in the left anterior vertebral body of L1.  Mild left periaortic adenopathy increased from prior, short axis lymph node measuring 1 cm. -Last PSA increased to 15.8 on 09/28/2019. -As there is rapid progression of disease, I have recommended docetaxel chemotherapy.  He is agreeable after long discussion. -Cystoscopy with transurethral resection of tumor in the posterior bladder neck, right and left walls of the bladder, right lobe of the prostate on 11/10/2019. -Plan was to do 6 cycles of docetaxel followed by abiraterone and prednisone. -6 cycles of docetaxel from 11/16/2019 through 02/29/2020. -CTAP on 01/06/2020 showed  persistent thickening of the urinary bladder wall.  Left hydronephrosis is significantly reduced compared to prior exam.  Left-sided retroperitoneal and left iliac lymph nodes reduced in size.  Interval increase in sclerosis of lesions of the left aspect of L2 vertebral body consistent with treatment response.  No new bone lesions. -Bone scan on 01/06/2020 shows stable L2 metastatic focus. -Bone scan scan images from 06/14/2020 which showed progressive metastatic disease, with increased size and intensity of L2 1 new focus of activity at L1. 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 -Abiraterone and prednisone from 06/24/2020 through 09/06/2021 with progression. - PSMA PET scan (09/28/2021): Residual/recurrent tumor involving prostate gland and seminal vesicles.  Tracer avid bone metastasis localized L1 and L2 vertebral bodies.  No new sites of bone metastasis disease.  No solid organ or nodal mets. - Darolutamide from 10/17/2021  through 03/15/2022 with progression   2.  Left hydroureteronephrosis: -CT scan showed prostate mass invading the posterior bladder and obstructing the ureter.  Creatinine increased to 1.1. -Renal ultrasound on 12/11/2019 showed interval resolution of previously seen left-sided hydronephrosis.  Previously seen soft tissue mass at the posterior bladder lumen no longer seen.   PLAN:  1.  Metastatic CRPC to the bones and lymph nodes: - We discussed the results of Guardant360: MSI high not detected.  Androgen resistance present. - Reviewed images of the CT [facility] from 03/26/2022: Left para-aortic lymph node 8 mm.  New subcentimeter lung nodules in both lobes.  New sclerotic bone metastasis in the L4 vertebral body.  Stable sclerotic bone metastasis involving L1 and L2 vertebral body. - Bone scan (03/23/2022): Osseous metastatic disease of the lumbar spine at L1, L2 and L4. - I have recommended L4 biopsy for NGS testing. - If he is found to have MSI high, he will proceed with Keytruda.   Otherwise options include cabazitaxel every 21 days. - We will schedule L4 biopsy.  We will send it to NGS testing.  After that he will get radiation to L2 and L4 for pain control.  I will see him back in 5 to 6 weeks for follow-up.    2.  Hot flashes: - Continue estradiol patch 0.1 mg twice weekly for hot flashes.   3.  Peripheral neuropathy: - Left foot tingling is stable.   4.  Lower back pain/bilateral hip pains: - He is taking ibuprofen during daytime and tramadol 100 mg at bedtime.  He is using heating pad most part of the day. - He met with Dr. Tammi Klippel who recommended radiation to L2 and L4. - He may proceed with radiation after the biopsy of the L4 lesion.  5.  Osteoporosis (DEXA scan 02/24/2021 T-score -2.5): - We have previously discussed role of denosumab/zoledronic acid.  He has poor dentition and decided against. - Continue calcium and vitamin D supplements.    Orders placed this encounter:  Orders Placed This Encounter  Procedures   CT BIOPSY       Scott [name], [place] [phone]

## 2022-04-04 NOTE — Progress Notes (Signed)
Corrie Mckusick, DO  Daishia Fetterly, Marva Panda, NT OK for CT guided L4 osseous met biopsy.  Note Dr. Tomie China request to avoid L2 bc of prior therapy. .  Prostate CA, high risk staging.  Potentially right posterior lateral approach, image 41/94 of 03/26/22.  Earleen Newport       Previous Messages    ----- Message ----- From: Allen Kell, NT Sent: 04/04/2022  12:53 PM EST To: Ir Procedure Requests Subject: CT Biopsy                                      Procedure: CT Biopsy  Reason: Stage IV Prostate Cancer, new L4 lesion. DO NOT BIOPSY L2 due to previous XRT Dx: Prostate cancer metastatic to multiple sites ([place]) Mark[facility] (ICD-10-CM)]; Malignant neoplasm of prostate metastatic to bone [facility])  History: CT and NM in chart  Provider: [name], MD  Contact: [phone]

## 2022-04-04 NOTE — Patient Instructions (Addendum)
Scott [name]  Discharge Instructions  You were seen and examined today by Dr. Delton Coombes.  Dr. Delton Coombes has revealed the results of your Guardant 360 results, which did not reveal any targetable mutations present. Dr. Delton Coombes has requested an additional biopsy so that we can send it for tissue testing to see if there are additional or newer treatment options.  Dr. Delton Coombes has recommended starting chemotherapy [name]) due to cancer progression. Your cancer has progressed and moved to the lungs.  Proceed with radiation therapy as discussed with the Radiation Oncologist.  Dr. Delton Coombes has ordered a CT chest to get a complete photo of the lung.  Follow-up as scheduled.  Thank you for choosing [facility] to provide your oncology and hematology care.   To afford each patient quality time with our provider, please arrive at least 15 minutes before your scheduled appointment time. You may need to reschedule your appointment if you arrive late (10 or more minutes). Arriving late affects you and other patients whose appointments are after yours.  Also, if you miss three or more appointments without notifying the office, you may be dismissed from the clinic at the provider's discretion.    Again, thank you for choosing [facility].  Our hope is that these requests will decrease the amount of time that you wait before being seen by our physicians.   If you have a lab appointment with the [facility] please come in thru the Main Entrance and check in at the main information desk.           _____________________________________________________________  Should you have questions after your visit to [facility], please contact our office at [phone] and follow the prompts.  Our office hours are 8:00 a.m. to 4:30 p.m. Monday - Thursday and 8:00 a.m. to 2:30 p.m. Friday.  Please note that voicemails left  after 4:00 p.m. may not be returned until the following business day.  We are closed weekends and all major holidays.  You do have access to a nurse 24-7, just call the main number to the clinic [phone] and do not press any options, hold on the line and a nurse will answer the phone.    For prescription refill requests, have your pharmacy contact our office and allow 72 hours.    Masks are optional in the cancer centers. If you would like for your care team to wear a mask while they are taking care of you, please let them know. You may have one support person who is at least 65 years old accompany you for your appointments.

## 2022-04-05 NOTE — Progress Notes (Signed)
  Radiation Oncology         [phone] ________________________________  Name: Scott [name] MRN: [id number]  Date: 04/06/2022  DOB: [birth year]  SIMULATION AND TREATMENT PLANNING NOTE    ICD-10-CM   1. Malignant neoplasm of prostate metastatic to bone [facility])  C61    C79.51       DIAGNOSIS:  67 y.o. 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   NARRATIVE:  The patient was brought to the [facility].  Identity was confirmed.  All relevant records and images related to the planned course of therapy were reviewed.  The patient freely provided informed written consent to proceed with treatment after reviewing the details related to the planned course of therapy. The consent form was witnessed and verified by the simulation staff.  Then, the patient was set-up in a stable reproducible  supine position for radiation therapy.  CT images were obtained.  Surface markings were placed.  The CT images were loaded into the planning software.  Then the target and avoidance structures were contoured.  Treatment planning then occurred.  The radiation prescription was entered and confirmed.  Then, I designed and supervised the construction of a total of 3 medically necessary complex treatment devices consisting of a body positioner and MLC apertures to cover the treated area.  I have requested : 3D Simulation  I have requested a DVH of the following structures: Left Kidney, Right Kidney and bowel.  PLAN:  The patient will receive 30 Gy in 10 fractions.  ________________________________  Sheral Apley Tammi Klippel, M.D.

## 2022-04-06 ENCOUNTER — Ambulatory Visit
Admission: RE | Admit: 2022-04-06 | Discharge: 2022-04-06 | Disposition: A | Discharge status: Home / Self Care | Payer: BC Managed Care – PPO | Source: Ambulatory Visit | Attending: Radiation Oncology | Admitting: Radiation Oncology

## 2022-04-06 ENCOUNTER — Other Ambulatory Visit: Payer: Self-pay

## 2022-04-06 DIAGNOSIS — Z510 Encounter for antineoplastic radiation therapy: Secondary | ICD-10-CM | POA: Diagnosis not present

## 2022-04-06 DIAGNOSIS — C61 Malignant neoplasm of prostate: Secondary | ICD-10-CM | POA: Insufficient documentation

## 2022-04-06 DIAGNOSIS — C7951 Secondary malignant neoplasm of bone: Secondary | ICD-10-CM | POA: Diagnosis present

## 2022-04-09 ENCOUNTER — Ambulatory Visit (HOSPITAL_COMMUNITY)
Admission: RE | Admit: 2022-04-09 | Discharge: 2022-04-09 | Disposition: A | Discharge status: Home / Self Care | Payer: BC Managed Care – PPO | Source: Ambulatory Visit | Attending: Hematology | Admitting: Hematology

## 2022-04-09 ENCOUNTER — Encounter (HOSPITAL_COMMUNITY): Payer: Self-pay

## 2022-04-09 ENCOUNTER — Other Ambulatory Visit: Payer: Self-pay

## 2022-04-09 ENCOUNTER — Other Ambulatory Visit: Payer: Self-pay | Admitting: Hematology

## 2022-04-09 VITALS — BP 101/47 | HR 53 | Temp 97.5°F | Resp 13 | Ht 66.0 in | Wt 173.0 lb

## 2022-04-09 DIAGNOSIS — Z923 Personal history of irradiation: Secondary | ICD-10-CM | POA: Insufficient documentation

## 2022-04-09 DIAGNOSIS — C61 Malignant neoplasm of prostate: Secondary | ICD-10-CM

## 2022-04-09 DIAGNOSIS — C7951 Secondary malignant neoplasm of bone: Secondary | ICD-10-CM | POA: Insufficient documentation

## 2022-04-09 HISTORY — PX: IR FLUORO GUIDED NEEDLE PLC ASPIRATION/INJECTION LOC: IMG2395

## 2022-04-09 LAB — PROTIME-INR
INR: 1 (ref 0.8–1.2)
Prothrombin Time: 13.2 seconds (ref 11.4–15.2)

## 2022-04-09 LAB — CBC
HCT: 34.6 % — ABNORMAL LOW (ref 39.0–52.0)
Hemoglobin: 12.4 g/dL — ABNORMAL LOW (ref 13.0–17.0)
MCH: 32.6 pg (ref 26.0–34.0)
MCHC: 35.8 g/dL (ref 30.0–36.0)
MCV: 91.1 fL (ref 80.0–100.0)
Platelets: 227 10*3/uL (ref 150–400)
RBC: 3.8 MIL/uL — ABNORMAL LOW (ref 4.22–5.81)
RDW: 11.9 % (ref 11.5–15.5)
WBC: 7.7 10*3/uL (ref 4.0–10.5)
nRBC: 0 % (ref 0.0–0.2)

## 2022-04-09 MED ORDER — FENTANYL CITRATE (PF) 100 MCG/2ML IJ SOLN
INTRAMUSCULAR | Status: AC
  Filled 2022-04-09: qty 2

## 2022-04-09 MED ORDER — MIDAZOLAM HCL 2 MG/2ML IJ SOLN
INTRAMUSCULAR | Status: AC | PRN
  Administered 2022-04-09 (×2): 1 mg via INTRAVENOUS
  Administered 2022-04-09: .5 mg via INTRAVENOUS
  Administered 2022-04-09: 1 mg via INTRAVENOUS
  Administered 2022-04-09: .5 mg via INTRAVENOUS

## 2022-04-09 MED ORDER — CEFAZOLIN SODIUM-DEXTROSE 2-4 GM/100ML-% IV SOLN
2.0000 g | INTRAVENOUS | Status: AC
  Administered 2022-04-09: 2 g via INTRAVENOUS

## 2022-04-09 MED ORDER — SODIUM CHLORIDE 0.9 % IV SOLN
INTRAVENOUS | Status: AC | PRN
  Administered 2022-04-09: 10 mL/h via INTRAVENOUS

## 2022-04-09 MED ORDER — MIDAZOLAM HCL 2 MG/2ML IJ SOLN
INTRAMUSCULAR | Status: AC
  Filled 2022-04-09: qty 4

## 2022-04-09 MED ORDER — SODIUM CHLORIDE 0.9 % IV SOLN: INTRAVENOUS | Status: DC

## 2022-04-09 MED ORDER — FENTANYL CITRATE (PF) 100 MCG/2ML IJ SOLN
INTRAMUSCULAR | Status: AC | PRN
  Administered 2022-04-09 (×4): 25 ug via INTRAVENOUS

## 2022-04-09 MED ORDER — BUPIVACAINE HCL (PF) 0.5 % IJ SOLN
INTRAMUSCULAR | Status: AC
  Administered 2022-04-09: 150 mg
  Filled 2022-04-09: qty 30

## 2022-04-09 MED ORDER — CEFAZOLIN SODIUM-DEXTROSE 2-4 GM/100ML-% IV SOLN
INTRAVENOUS | Status: AC
  Filled 2022-04-09: qty 100

## 2022-04-09 NOTE — Procedures (Signed)
INTERVENTIONAL NEURORADIOLOGY BRIEF POSTPROCEDURE NOTE  FLUOROSCOPY GUIDED L4 CORE BONE BIOPSY    Attending: Dr. Pedro Earls   Diagnosis: L4 presumed metastatic lesion    Access site: Percutaneous, right posterolateral    Anesthesia: Moderate sedation    Medication used: 4 mg Versed IV; 100 mcg Fentanyl IV.   Complications: None    Estimated blood loss: Negligible    Specimen: L4 vertebral body core bone biopsy sent for tissue exam in formalin.    Right posterolateral transpedicular approach utilized for L4 core bone biopsy.  A large bone sample was obtained.  Multiple other passes retrieved bloody content.    The patient tolerated the procedure well without incident or complication and is in stable condition.

## 2022-04-09 NOTE — H&P (Signed)
Chief Complaint: Patient was seen in consultation today for metastatic prostate cancer at the request of [place]  Referring Physician(s): Katragadda,Sreedhar  Supervising Physician: Pedro Earls  Patient Status: [facility] - Out-pt  History of Present Illness: Scott [name] is a 67 y.o. 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  L2 was previously treated with radiation therapy.  L4 is found to have new lesion.  IR asked for biopsy for molecular testing.  Case reviewed by Dr. Earleen Newport who approves case.  Scott [name] is scheduled with Dr. Margarita Sermons today.  Past Medical History:  Diagnosis Date   Bladder obstruction    Complication of anesthesia    Diverticulitis 2015   Dyspnea    Essential hypertension, benign 01/26/2019   at [place] office elevated, normal at home, no medications at this time   History of basal cell cancer    HLD (hyperlipidemia) 01/26/2019   Hypothyroidism    history of, not currently taking medication   PONV (postoperative nausea and vomiting)    Prostate cancer ([place])    metastatic to bones   Vitamin D deficiency disease 01/26/2019    Past Surgical History:  Procedure Laterality Date   CYSTOSCOPY W/ URETERAL STENT PLACEMENT Left 11/10/2019   Procedure: CYSTOSCOPY;  Surgeon: Irine Seal, MD;  Location: [facility] ORS;  Service: Urology;  Laterality: Left;   ESOPHAGOGASTRODUODENOSCOPY (EGD) WITH PROPOFOL N/A 07/07/2020   Procedure: ESOPHAGOGASTRODUODENOSCOPY (EGD) WITH PROPOFOL;  Surgeon: Daneil Dolin, MD;  Location: [facility] END[facility] SU[facility];  Service: Endoscopy;  Laterality: N/A;  pm appt   HERNIA REPAIR  [id number]   UMBILICAL    MALONEY DILATION N/A 07/07/2020   Procedure: MALONEY DILATION;  Surgeon: Daneil Dolin, MD;  Location: [facility];  Service: Endoscopy;  Laterality: N/A;   PROSTATE SURGERY     SKIN CANCER EXCISION     TONSILLECTOMY Bilateral    TOOTH  EXTRACTION     front left tooth pulled but no surgery or stitches   TRANSURETHRAL RESECTION OF PROSTATE N/A 01/25/2017   Procedure: TRANSURETHRAL RESECTION OF THE PROSTATE (TURP);  Surgeon: Alexis Frock, MD;  Location: [facility];  Service: Urology;  Laterality: N/A;   TRANSURETHRAL RESECTION OF PROSTATE N/A 11/10/2019   Procedure: TRANSURETHRAL RESECTION OF THE PROSTATE (TURP);  Surgeon: Irine Seal, MD;  Location: [facility];  Service: Urology;  Laterality: N/A;   WISDOM TOOTH EXTRACTION      Allergies: Crestor [rosuvastatin], Ivp dye [iodinated contrast media], and Wellbutrin [bupropion]  Medications: Prior to Admission medications   Medication Sig Start Date End Date Taking? Authorizing Provider  arginine 500 MG tablet Take 500 mg by mouth 2 (two) times daily.   Yes [provider]  Cholecalciferol 125 MCG (5000 UT) capsule Take 5,000-10,000 Units by mouth See admin instructions. Take 10000 units in the morning and 5000 units at night   Yes [provider]  dutasteride (AVODART) 0.5 MG capsule Take 1 capsule (0.5 mg total) by mouth daily. 12/07/21  Yes Irine Seal, MD  estradiol (VIVELLE-DOT) 0.1 MG/24HR patch APPLY 1 PATCH(0.1 MG) TOPICALLY TO THE SKIN 2 TIMES A WEEK 11/13/21  Yes [name], MD  ibuprofen (ADVIL) 200 MG tablet Take 400 mg by mouth every 6 (six) hours as needed for moderate pain.   Yes [provider]  MAGNESIUM PO Take 1 tablet by mouth at bedtime.   Yes [provider]  MELATONIN PO Take 40 mg by mouth at bedtime.  Yes [provider]  MILK THISTLE PO Take 1 capsule by mouth daily.   Yes [provider]  NATTOKINASE PO Take 2 capsules by mouth daily.   Yes [provider]  NP THYROID 120 MG tablet Take 0.5 tablets (60 mg total) by mouth every morning. 02/22/22  Yes Janith Lima, MD  olmesartan (BENICAR) 20 MG tablet TAKE 1 TABLET(20 MG) BY MOUTH DAILY 03/30/22  Yes Janith Lima, MD  ondansetron  (ZOFRAN ODT) 8 MG disintegrating tablet Take 1 tablet (8 mg total) by mouth every 8 (eight) hours as needed for nausea or vomiting. 11/29/21  Yes [name], MD  predniSONE (DELTASONE) 5 MG tablet Take 5 mg by mouth daily. 03/02/22  Yes [provider]  QUERCETIN PO Take 1 tablet by mouth 2 (two) times daily.   Yes [provider]  thiamine (VITAMIN B-1) 50 MG tablet Take 1 tablet (50 mg total) by mouth daily. 08/24/21  Yes Janith Lima, MD  traMADol HCl 100 MG TABS Take 100 mg by mouth at bedtime as needed. 03/15/22  Yes [name], MD  VITAMIN K PO Take 1 capsule by mouth every other day.   Yes [provider]  Zinc 30 MG CAPS Take 30 mg by mouth daily.   Yes [provider]  DANDELION ROOT PO Take 500 mg by mouth daily.    [provider]     Family History  Problem Relation Age of Onset   Emphysema Mother    Alzheimer's disease Father    Prostate cancer Father        dx in his 43s   Prostate cancer Brother        dx in his 46s   Breast cancer Maternal Grandmother        51s   Pancreatic cancer Maternal Grandfather        67s   Dementia Paternal Grandmother    Heart attack Paternal Grandfather    Colon cancer Neg Hx     Social History   Socioeconomic History   Marital status: Married    Spouse name: Not on file   Number of children: 1   Years of education: Not on file   Highest education level: Not on file  Occupational History    Comment: working full time  Tobacco Use   Smoking status: Former    Packs/day: 1.50    Years: 35.00    Total pack years: 52.50    Types: Cigarettes    Quit date: 01/12/2004    Years since quitting: 18.2   Smokeless tobacco: Never   Tobacco comments:    OVER 30 YEARS HX OF SMOKING   Vaping Use   Vaping Use: Never used  Substance and Sexual Activity   Alcohol use: Not Currently    Comment: heavy drinker until 2004   Drug use: Not Currently    Types: Marijuana   Sexual  activity: Not Currently  Other Topics Concern   Not on file  Social History Narrative   Married for 19 years.Works for [employer] from home.   Social Determinants of Health   Financial Resource Strain: Low Risk  (03/02/2020)   Overall Financial Resource Strain (CARDIA)    Difficulty of Paying Living Expenses: Not hard at all  Food Insecurity: No Food Insecurity (03/02/2020)   Hunger Vital Sign    Worried About Running Out of Food in the Last Year: Never true    Ran Out of Food in  the Last Year: Never true  Transportation Needs: No Transportation Needs (03/02/2020)   PRAPARE - Hydrologist (Medical): No    Lack of Transportation (Non-Medical): No  Physical Activity: Inactive (03/02/2020)   Exercise Vital Sign    Days of Exercise per Week: 0 days    Minutes of Exercise per Session: 0 min  Stress: No Stress Concern Present (03/02/2020)   [place]    Feeling of Stress : Only a little  Social Connections: Moderately Integrated (03/02/2020)   Social Connection and Isolation Panel [NHANES]    Frequency of Communication with Friends and Family: Three times a week    Frequency of Social Gatherings with Friends and Family: Never    Attends Religious Services: Never    Marine scientist or Organizations: Yes    Attends Archivist Meetings: 1 to 4 times per year    Marital Status: Married   Review of Systems  Constitutional: Negative.   Respiratory: Negative.    Cardiovascular: Negative.   Gastrointestinal: Negative.   Musculoskeletal:  Positive for back pain.  Neurological: Negative.   Psychiatric/Behavioral: Negative.      Vital Signs: BP 117/72   Pulse 67   Temp (!) 97.5 F (36.4 C) (Temporal)   Resp 17   Ht '5\' 6"'$  (1.676 m)   Wt 173 lb (78.5 kg)   SpO2 96%   BMI 27.92 kg/m   Physical Exam Constitutional:      General: He is not in acute  distress. HENT:     Head: Normocephalic and atraumatic.     Mouth/Throat:     Mouth: Mucous membranes are moist.     Pharynx: Oropharynx is clear.  Eyes:     General: No scleral icterus.    Extraocular Movements: Extraocular movements intact.  Cardiovascular:     Rate and Rhythm: Normal rate and regular rhythm.     Pulses: Normal pulses.     Heart sounds: Normal heart sounds.  Pulmonary:     Effort: Pulmonary effort is normal. No respiratory distress.     Breath sounds: Normal breath sounds.  Abdominal:     General: Abdomen is flat.     Palpations: Abdomen is soft.  Skin:    General: Skin is warm and dry.  Neurological:     General: No focal deficit present.     Mental Status: He is alert and oriented to person, place, and time.  Psychiatric:        Mood and Affect: Mood normal.        Behavior: Behavior normal.    Imaging: CT Abdomen Pelvis W Contrast  Result Date: 03/26/2022 CLINICAL DATA:  Follow-up metastatic prostate carcinoma. * Tracking Code: BO * EXAM: CT ABDOMEN AND PELVIS WITH CONTRAST TECHNIQUE: Multidetector CT imaging of the abdomen and pelvis was performed using the standard protocol following bolus administration of intravenous contrast. RADIATION DOSE REDUCTION: This exam was performed according to the departmental dose-optimization program which includes automated exposure control, adjustment of the mA and/or kV according to patient size and/or use of iterative reconstruction technique. CONTRAST:  177m OMNIPAQUE IOHEXOL 300 MG/ML  SOLN COMPARISON:  PET-CT on 09/28/2021 FINDINGS: Lower Chest: Few tiny sub-cm pulmonary nodules are seen in both lower lobes, largest in the medial left lower lobe measuring 8 mm on image 9/6. These were not definitely seen on prior studies, and raise suspicion for pulmonary metastases. Hepatobiliary: No hepatic masses identified.  Gallbladder is unremarkable. No evidence of biliary ductal dilatation. Pancreas:  No mass or inflammatory  changes. Spleen: Within normal limits in size and appearance. Adrenals/Urinary Tract: No suspicious masses identified. No evidence of ureteral calculi or hydronephrosis. Stomach/Bowel: No evidence of obstruction, inflammatory process or abnormal fluid collections. Vascular/Lymphatic: 8 mm left para-aortic lymph node on image 40/2 is increased in size since previous study, suspicious for metastatic disease. Sub-centimeter bilateral external iliac lymph nodes remains stable. Aortic atherosclerotic calcification incidentally noted. No acute vascular findings. Reproductive: Small prostate gland with prior TURP defect again noted. Other: Stable paraumbilical ventral hernia again seen, which contains only fat. Musculoskeletal: Stable appearance of sclerotic bone metastases involving the L1 and L2 vertebra. New sclerotic lesion seen in the L4 vertebral body, consistent with bone metastasis. IMPRESSION: Increased size of 8 mm left para-aortic lymph node, suspicious for metastatic disease. New sub-cm pulmonary nodules in both lower lobes. Pulmonary metastases cannot be excluded. Chest CT is recommended for further evaluation. New sclerotic bone metastasis in L4 vertebral body. Stable sclerotic bone metastases involving L1 and L2 vertebra. Stable paraumbilical ventral hernia, which contains only fat. Aortic Atherosclerosis (ICD10-I70.0). Electronically Signed   By: Marlaine Hind M.D.   On: 03/26/2022 16:10   NM Bone Scan Whole Body  Result Date: 03/23/2022 CLINICAL DATA:  High-risk prostate cancer, staging, patient with low back and BILATERAL hip/thigh pain, no history of trauma EXAM: NUCLEAR MEDICINE WHOLE BODY BONE SCAN TECHNIQUE: Whole body anterior and posterior images were obtained approximately 3 hours after intravenous injection of radiopharmaceutical. RADIOPHARMACEUTICALS:  21 mCi Technetium-21mMDP IV COMPARISON:  06/14/2020 Correlation: PET-CT 09/28/2021 FINDINGS: Increased tracer uptake in lumbar spine at L1, L2,  and L4. Uptake at both L2 and L4 is progressive since the previous exam while uptake at L1 is little changed. No additional sites of concerning abnormal tracer accumulation are identified. Minimal degenerative uptake of tracer at shoulders, sternoclavicular joints, LEFT knee, LEFT foot. Expected urinary tract and soft tissue distribution of tracer. 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 Electronically Signed   By: MLavonia DanaM.D.   On: 03/23/2022 14:59    Labs:  CBC: Recent Labs    11/01/21 0945 12/13/21 1108 03/08/22 1400 04/09/22 1147  WBC 4.1 4.7 6.4 7.7  HGB 13.0 13.4 12.6* 12.4*  HCT 36.2* 38.1* 37.1* 34.6*  PLT 228 219 221 227    COAGS: Recent Labs    04/09/22 1147  INR 1.0    BMP: Recent Labs    08/30/21 1243 11/01/21 0945 12/13/21 1108 02/22/22 1353 03/08/22 1400  NA 135 135 133* 134* 131*  K 4.0 3.8 4.5 4.3 4.2  CL 105 105 102 101 101  CO2 '24 24 22 26 22  '$ GLUCOSE 102* 122* 122* 110* 103*  BUN '18 18 11 15 16  '$ CALCIUM 9.1 9.3 9.2 9.4 9.1  CREATININE 0.97 1.01 0.98 1.04 0.93  GFRNONAA >60 >60 >60  --  >60    LIVER FUNCTION TESTS: Recent Labs    08/30/21 1243 11/01/21 0945 12/13/21 1108 03/08/22 1400  BILITOT 1.0 0.8 1.5* 1.5*  AST 14* '15 18 16  '$ ALT '12 17 18 14  '$ ALKPHOS 64 59 64 73  PROT 6.6 6.7 7.1 6.8  ALBUMIN 3.8 3.9 4.4 4.1    Assessment and Plan:  Mr. WNikita Humbleis a pleasant 67year old male with history of metastatic prostate cancer and new lesion at L4.  IR asked  to biopsy.  Pt is appropriately NPO, and not on thinners.  His wife will drive him home today and be available for aftercare.  History, imaging, labs, vitals, and medications reviewed.  Will plan to proceed today with L4 biopsy and expected discharge later today.  Risks and benefits of L4 biopsy was discussed with the patient and/or patient's family including, but not limited to bleeding, infection,  damage to adjacent structures or low yield requiring additional tests.  All of the questions were answered and there is agreement to proceed.  Consent signed and in chart.   Thank you for this interesting consult.  I greatly enjoyed meeting Scott [name] and look forward to participating in their care.  A copy of this report was sent to the requesting provider on this date.  Electronically Signed: Pasty Spillers, PA 04/09/2022, 12:50 PM   I spent a total of  20 minutes  in face to face in clinical consultation, greater than 50% of which was counseling/coordinating care for L4 lesion biopsy.

## 2022-04-10 ENCOUNTER — Other Ambulatory Visit: Payer: Self-pay

## 2022-04-10 ENCOUNTER — Ambulatory Visit
Admission: RE | Admit: 2022-04-10 | Discharge: 2022-04-10 | Disposition: A | Discharge status: Home / Self Care | Payer: BC Managed Care – PPO | Source: Ambulatory Visit | Attending: Radiation Oncology | Admitting: Radiation Oncology

## 2022-04-10 DIAGNOSIS — C61 Malignant neoplasm of prostate: Secondary | ICD-10-CM

## 2022-04-10 DIAGNOSIS — C7951 Secondary malignant neoplasm of bone: Secondary | ICD-10-CM | POA: Diagnosis not present

## 2022-04-10 LAB — RAD ONC ARIA SESSION SUMMARY
Course Elapsed Days: 0
Plan Fractions Treated to Date: 1
Plan Prescribed Dose Per Fraction: 3 Gy
Plan Total Fractions Prescribed: 10
Plan Total Prescribed Dose: 30 Gy
Reference Point Dosage Given to Date: 3 Gy
Reference Point Session Dosage Given: 3 Gy
Session Number: 1

## 2022-04-11 ENCOUNTER — Ambulatory Visit
Admission: RE | Admit: 2022-04-11 | Discharge: 2022-04-11 | Disposition: A | Discharge status: Home / Self Care | Payer: BC Managed Care – PPO | Source: Ambulatory Visit | Attending: Radiation Oncology | Admitting: Radiation Oncology

## 2022-04-11 ENCOUNTER — Other Ambulatory Visit: Payer: Self-pay

## 2022-04-11 DIAGNOSIS — C7951 Secondary malignant neoplasm of bone: Secondary | ICD-10-CM | POA: Diagnosis not present

## 2022-04-11 LAB — RAD ONC ARIA SESSION SUMMARY
Course Elapsed Days: 1
Plan Fractions Treated to Date: 2
Plan Prescribed Dose Per Fraction: 3 Gy
Plan Total Fractions Prescribed: 10
Plan Total Prescribed Dose: 30 Gy
Reference Point Dosage Given to Date: 6 Gy
Reference Point Session Dosage Given: 3 Gy
Session Number: 2

## 2022-04-12 ENCOUNTER — Ambulatory Visit
Admission: RE | Admit: 2022-04-12 | Discharge: 2022-04-12 | Disposition: A | Discharge status: Home / Self Care | Payer: BC Managed Care – PPO | Source: Ambulatory Visit | Attending: Radiation Oncology | Admitting: Radiation Oncology

## 2022-04-12 ENCOUNTER — Other Ambulatory Visit: Payer: Self-pay

## 2022-04-12 DIAGNOSIS — C7951 Secondary malignant neoplasm of bone: Secondary | ICD-10-CM | POA: Diagnosis not present

## 2022-04-12 LAB — RAD ONC ARIA SESSION SUMMARY
Course Elapsed Days: 2
Plan Fractions Treated to Date: 3
Plan Prescribed Dose Per Fraction: 3 Gy
Plan Total Fractions Prescribed: 10
Plan Total Prescribed Dose: 30 Gy
Reference Point Dosage Given to Date: 9 Gy
Reference Point Session Dosage Given: 3 Gy
Session Number: 3

## 2022-04-12 LAB — SURGICAL PATHOLOGY

## 2022-04-13 ENCOUNTER — Ambulatory Visit
Admission: RE | Admit: 2022-04-13 | Discharge: 2022-04-13 | Disposition: A | Discharge status: Home / Self Care | Payer: BC Managed Care – PPO | Source: Ambulatory Visit | Attending: Radiation Oncology | Admitting: Radiation Oncology

## 2022-04-13 ENCOUNTER — Other Ambulatory Visit: Payer: Self-pay

## 2022-04-13 DIAGNOSIS — C7951 Secondary malignant neoplasm of bone: Secondary | ICD-10-CM | POA: Diagnosis not present

## 2022-04-13 LAB — RAD ONC ARIA SESSION SUMMARY
Course Elapsed Days: 3
Plan Fractions Treated to Date: 4
Plan Prescribed Dose Per Fraction: 3 Gy
Plan Total Fractions Prescribed: 10
Plan Total Prescribed Dose: 30 Gy
Reference Point Dosage Given to Date: 12 Gy
Reference Point Session Dosage Given: 3 Gy
Session Number: 4

## 2022-04-16 ENCOUNTER — Other Ambulatory Visit: Payer: Self-pay

## 2022-04-16 ENCOUNTER — Ambulatory Visit
Admission: RE | Admit: 2022-04-16 | Discharge: 2022-04-16 | Disposition: A | Discharge status: Home / Self Care | Payer: BC Managed Care – PPO | Source: Ambulatory Visit | Attending: Radiation Oncology | Admitting: Radiation Oncology

## 2022-04-16 DIAGNOSIS — C7951 Secondary malignant neoplasm of bone: Secondary | ICD-10-CM | POA: Diagnosis not present

## 2022-04-16 LAB — RAD ONC ARIA SESSION SUMMARY
Course Elapsed Days: 6
Plan Fractions Treated to Date: 5
Plan Prescribed Dose Per Fraction: 3 Gy
Plan Total Fractions Prescribed: 10
Plan Total Prescribed Dose: 30 Gy
Reference Point Dosage Given to Date: 15 Gy
Reference Point Session Dosage Given: 3 Gy
Session Number: 5

## 2022-04-17 ENCOUNTER — Other Ambulatory Visit: Payer: Self-pay

## 2022-04-17 ENCOUNTER — Ambulatory Visit
Admission: RE | Admit: 2022-04-17 | Discharge: 2022-04-17 | Disposition: A | Discharge status: Home / Self Care | Payer: BC Managed Care – PPO | Source: Ambulatory Visit | Attending: Radiation Oncology | Admitting: Radiation Oncology

## 2022-04-17 ENCOUNTER — Ambulatory Visit: Payer: BC Managed Care – PPO | Admitting: Radiation Oncology

## 2022-04-17 DIAGNOSIS — C7951 Secondary malignant neoplasm of bone: Secondary | ICD-10-CM | POA: Diagnosis not present

## 2022-04-17 LAB — RAD ONC ARIA SESSION SUMMARY
Course Elapsed Days: 7
Plan Fractions Treated to Date: 6
Plan Prescribed Dose Per Fraction: 3 Gy
Plan Total Fractions Prescribed: 10
Plan Total Prescribed Dose: 30 Gy
Reference Point Dosage Given to Date: 18 Gy
Reference Point Session Dosage Given: 3 Gy
Session Number: 6

## 2022-04-18 ENCOUNTER — Ambulatory Visit: Payer: BC Managed Care – PPO

## 2022-04-18 ENCOUNTER — Other Ambulatory Visit: Payer: Self-pay

## 2022-04-18 ENCOUNTER — Ambulatory Visit
Admission: RE | Admit: 2022-04-18 | Discharge: 2022-04-18 | Disposition: A | Discharge status: Home / Self Care | Payer: BC Managed Care – PPO | Source: Ambulatory Visit | Attending: Radiation Oncology | Admitting: Radiation Oncology

## 2022-04-18 DIAGNOSIS — C7951 Secondary malignant neoplasm of bone: Secondary | ICD-10-CM | POA: Diagnosis not present

## 2022-04-18 LAB — RAD ONC ARIA SESSION SUMMARY
Course Elapsed Days: 8
Plan Fractions Treated to Date: 7
Plan Prescribed Dose Per Fraction: 3 Gy
Plan Total Fractions Prescribed: 10
Plan Total Prescribed Dose: 30 Gy
Reference Point Dosage Given to Date: 21 Gy
Reference Point Session Dosage Given: 3 Gy
Session Number: 7

## 2022-04-19 ENCOUNTER — Other Ambulatory Visit: Payer: Self-pay

## 2022-04-19 ENCOUNTER — Other Ambulatory Visit: Payer: BC Managed Care – PPO | Admitting: Licensed Clinical Social Worker

## 2022-04-19 ENCOUNTER — Ambulatory Visit (HOSPITAL_COMMUNITY): Payer: BC Managed Care – PPO

## 2022-04-19 ENCOUNTER — Ambulatory Visit
Admission: RE | Admit: 2022-04-19 | Discharge: 2022-04-19 | Disposition: A | Discharge status: Home / Self Care | Payer: BC Managed Care – PPO | Source: Ambulatory Visit | Attending: Radiation Oncology | Admitting: Radiation Oncology

## 2022-04-19 ENCOUNTER — Ambulatory Visit: Payer: BC Managed Care – PPO

## 2022-04-19 DIAGNOSIS — C61 Malignant neoplasm of prostate: Secondary | ICD-10-CM | POA: Diagnosis not present

## 2022-04-19 DIAGNOSIS — C7951 Secondary malignant neoplasm of bone: Secondary | ICD-10-CM | POA: Insufficient documentation

## 2022-04-19 DIAGNOSIS — Z510 Encounter for antineoplastic radiation therapy: Secondary | ICD-10-CM | POA: Insufficient documentation

## 2022-04-19 LAB — RAD ONC ARIA SESSION SUMMARY
Course Elapsed Days: 9
Plan Fractions Treated to Date: 8
Plan Prescribed Dose Per Fraction: 3 Gy
Plan Total Fractions Prescribed: 10
Plan Total Prescribed Dose: 30 Gy
Reference Point Dosage Given to Date: 24 Gy
Reference Point Session Dosage Given: 3 Gy
Session Number: 8

## 2022-04-20 ENCOUNTER — Ambulatory Visit
Admission: RE | Admit: 2022-04-20 | Discharge: 2022-04-20 | Disposition: A | Discharge status: Home / Self Care | Payer: BC Managed Care – PPO | Source: Ambulatory Visit | Attending: Radiation Oncology | Admitting: Radiation Oncology

## 2022-04-20 ENCOUNTER — Other Ambulatory Visit: Payer: Self-pay

## 2022-04-20 ENCOUNTER — Ambulatory Visit: Payer: BC Managed Care – PPO

## 2022-04-20 DIAGNOSIS — C7951 Secondary malignant neoplasm of bone: Secondary | ICD-10-CM | POA: Diagnosis not present

## 2022-04-20 LAB — RAD ONC ARIA SESSION SUMMARY
Course Elapsed Days: 10
Plan Fractions Treated to Date: 9
Plan Prescribed Dose Per Fraction: 3 Gy
Plan Total Fractions Prescribed: 10
Plan Total Prescribed Dose: 30 Gy
Reference Point Dosage Given to Date: 27 Gy
Reference Point Session Dosage Given: 3 Gy
Session Number: 9

## 2022-04-23 ENCOUNTER — Other Ambulatory Visit: Payer: Self-pay

## 2022-04-23 ENCOUNTER — Ambulatory Visit: Payer: BC Managed Care – PPO

## 2022-04-23 ENCOUNTER — Encounter: Payer: Self-pay | Admitting: Urology

## 2022-04-23 ENCOUNTER — Ambulatory Visit
Admission: RE | Admit: 2022-04-23 | Discharge: 2022-04-23 | Disposition: A | Discharge status: Home / Self Care | Payer: BC Managed Care – PPO | Source: Ambulatory Visit | Attending: Radiation Oncology | Admitting: Radiation Oncology

## 2022-04-23 DIAGNOSIS — C7951 Secondary malignant neoplasm of bone: Secondary | ICD-10-CM | POA: Diagnosis not present

## 2022-04-23 LAB — RAD ONC ARIA SESSION SUMMARY
Course Elapsed Days: 13
Plan Fractions Treated to Date: 10
Plan Prescribed Dose Per Fraction: 3 Gy
Plan Total Fractions Prescribed: 10
Plan Total Prescribed Dose: 30 Gy
Reference Point Dosage Given to Date: 30 Gy
Reference Point Session Dosage Given: 3 Gy
Session Number: 10

## 2022-04-24 ENCOUNTER — Ambulatory Visit: Payer: BC Managed Care – PPO

## 2022-04-25 ENCOUNTER — Ambulatory Visit: Payer: BC Managed Care – PPO

## 2022-04-25 NOTE — Progress Notes (Signed)
Voice mail left for the patient FMLA papers are ready to be picked up.

## 2022-04-26 ENCOUNTER — Ambulatory Visit: Payer: BC Managed Care – PPO

## 2022-04-27 ENCOUNTER — Ambulatory Visit: Payer: BC Managed Care – PPO

## 2022-04-30 ENCOUNTER — Ambulatory Visit: Payer: BC Managed Care – PPO

## 2022-05-03 ENCOUNTER — Inpatient Hospital Stay: Payer: BC Managed Care – PPO | Admitting: Licensed Clinical Social Worker

## 2022-05-03 ENCOUNTER — Inpatient Hospital Stay: Payer: BC Managed Care – PPO | Attending: Hematology | Admitting: Dietician

## 2022-05-03 ENCOUNTER — Telehealth: Payer: Self-pay | Admitting: Dietician

## 2022-05-03 DIAGNOSIS — M25552 Pain in left hip: Secondary | ICD-10-CM | POA: Insufficient documentation

## 2022-05-03 DIAGNOSIS — N1330 Unspecified hydronephrosis: Secondary | ICD-10-CM | POA: Insufficient documentation

## 2022-05-03 DIAGNOSIS — Z923 Personal history of irradiation: Secondary | ICD-10-CM | POA: Insufficient documentation

## 2022-05-03 DIAGNOSIS — C7951 Secondary malignant neoplasm of bone: Secondary | ICD-10-CM | POA: Insufficient documentation

## 2022-05-03 DIAGNOSIS — Z5189 Encounter for other specified aftercare: Secondary | ICD-10-CM | POA: Insufficient documentation

## 2022-05-03 DIAGNOSIS — I10 Essential (primary) hypertension: Secondary | ICD-10-CM | POA: Insufficient documentation

## 2022-05-03 DIAGNOSIS — E039 Hypothyroidism, unspecified: Secondary | ICD-10-CM | POA: Insufficient documentation

## 2022-05-03 DIAGNOSIS — M25551 Pain in right hip: Secondary | ICD-10-CM | POA: Insufficient documentation

## 2022-05-03 DIAGNOSIS — Z9079 Acquired absence of other genital organ(s): Secondary | ICD-10-CM | POA: Insufficient documentation

## 2022-05-03 DIAGNOSIS — C61 Malignant neoplasm of prostate: Secondary | ICD-10-CM

## 2022-05-03 DIAGNOSIS — Z87891 Personal history of nicotine dependence: Secondary | ICD-10-CM | POA: Insufficient documentation

## 2022-05-03 DIAGNOSIS — Z79899 Other long term (current) drug therapy: Secondary | ICD-10-CM | POA: Insufficient documentation

## 2022-05-03 DIAGNOSIS — M810 Age-related osteoporosis without current pathological fracture: Secondary | ICD-10-CM | POA: Insufficient documentation

## 2022-05-03 DIAGNOSIS — R232 Flushing: Secondary | ICD-10-CM | POA: Insufficient documentation

## 2022-05-03 DIAGNOSIS — G629 Polyneuropathy, unspecified: Secondary | ICD-10-CM | POA: Insufficient documentation

## 2022-05-03 DIAGNOSIS — Z5111 Encounter for antineoplastic chemotherapy: Secondary | ICD-10-CM | POA: Insufficient documentation

## 2022-05-03 NOTE — Telephone Encounter (Signed)
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 S/p TURP followed by docetaxel x 6 cycles (11/16/19-02/29/20), abiraterone + prednisone (8/30-12/13/21). Darolutamide discontinued secondary to progression. Patient completed palliative radiation to bony lesions under the care of Dr. Sondra Come (1/23-2/5). He is pending start of cabazitaxel q21d  Spoke with patient and his wife via telephone. Patient reports increased energy levels and significantly less pain s/p radiation therapy to spine. He has been able to go on walks with the dog a few times. He endorses early satiety, recalls eating small meals/snacks every 2 hours. He is drinking one Ensure Original (220 kcal, 9 g). Patient reports having daily episodes of nausea with vomiting. He thought this might be due to possible ulcer from 6 months of heavy NSAID use for pain control. Patient states he had not had an episode x5 days until this morning. He has zofran-odt, however episodes arise quickly (20-30 seconds). Patient denies bloody emesis/bowel movements.   Nutrition Focused Physical Exam: unable to complete (telephone visit)   Medications: reviewed    Labs: 1/22 labs reviewed    Anthropometrics: Patient endorses intentional ~20 lb wt loss s/p wt increase with steroids. Per wife, pt cut out junk food, increased intake of fruits/vegetables + activity  Height: 5'6" Weight: 173 lb (1/22) UBW: 175 lb  BMI: 27.92   NUTRITION DIAGNOSIS: Predicted suboptimal intake related to cancer and associated treatment side effects as evidenced by reported early satiety, nausea, vomiting   INTERVENTION:  Discussed adequate calorie/protein energy intake to maintain strength/weights Encouraged protein source with all meals and snacks, suggested trying high protein snack at bedtime  Consider EGD given persistent symptoms Encouraged activity as able Will provide  handouts + coupons at 2/19 office visit   MONITORING, EVALUATION, GOAL: weight trends, intake   Next Visit: To be scheduled in collaboration with upcoming [facility] appointments

## 2022-05-04 ENCOUNTER — Other Ambulatory Visit: Payer: Self-pay | Admitting: Hematology

## 2022-05-04 DIAGNOSIS — C61 Malignant neoplasm of prostate: Secondary | ICD-10-CM

## 2022-05-04 NOTE — Progress Notes (Signed)
ON PATHWAY REGIMEN - Prostate  No Change  Continue With Treatment as Ordered.  Original Decision Date/Time: 11/04/2019 16:16     A cycle is every 21 days:     Docetaxel      Prednisone   **Always confirm dose/schedule in your pharmacy ordering system**  Patient Characteristics: Adenocarcinoma, Recurrent/New Systemic Disease, Castration Resistant, M1, No Prior Novel Hormonal Agent Histology: Adenocarcinoma Therapeutic Status: Recurrent/New Systemic Disease  Intent of Therapy: Non-Curative / Palliative Intent, Discussed with Patient

## 2022-05-04 NOTE — Progress Notes (Signed)
DISCONTINUE ON PATHWAY REGIMEN - Prostate     A cycle is every 21 days:     Docetaxel      Prednisone   **Always confirm dose/schedule in your pharmacy ordering system**  REASON: Other Reason PRIOR TREATMENT: [id number]: Docetaxel 75 mg/m2 q21 Days + Prednisone 5 mg BID Until Progression or Toxicity TREATMENT RESPONSE: Partial Response (PR)  START ON PATHWAY REGIMEN - Prostate     A cycle is every 21 days.:     Cabazitaxel      Prednisone   **Always confirm dose/schedule in your pharmacy ordering system**  Patient Characteristics: Adenocarcinoma, Recurrent/New Systemic Disease (Including Biochemical Recurrence), Castration Resistant, M1, Prior Novel Hormonal Agent, No Molecular Alteration or Targeted Therapy Exhausted, Prior Docetaxel/Docetaxel Not Indicated Histology: Adenocarcinoma Therapeutic Status: Recurrent/New Systemic Disease (Including Biochemical Recurrence)  Intent of Therapy: Non-Curative / Palliative Intent, Discussed with Patient

## 2022-05-04 NOTE — Progress Notes (Signed)
[place] Clinical Social Work  Initial Assessment   Scott [name] is a 67 y.o. year old male contacted by phone. Clinical Social Work was referred by medical provider for assessment of psychosocial needs.   SDOH (Social Determinants of Health) assessments performed: Yes   SDOH Screenings   Food Insecurity: No Food Insecurity (03/02/2020)  Housing: Low Risk  (02/29/2020)  Transportation Needs: No Transportation Needs (03/02/2020)  Alcohol Screen: Low Risk  (02/29/2020)  Depression (PHQ2-9): Low Risk  (08/24/2021)  Financial Resource Strain: Low Risk  (03/02/2020)  Physical Activity: Inactive (03/02/2020)  Social Connections: Moderately Integrated (03/02/2020)  Stress: No Stress Concern Present (03/02/2020)  Tobacco Use: Medium Risk (04/09/2022)     Distress Screen completed: Yes    03/28/2022    4:45 PM  ONCBCN DISTRESS SCREENING  Distress experienced in past week (1-10) 7  Emotional problem type Adjusting to illness;Nervousness/Anxiety      Family/Social Information:  Housing Arrangement: patient lives with his wife,  Scott [name] members/support persons in your life? Pt's wife is his primary source of support; however, the couple has close friends who also assist if needed. 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  The couple has a son at [employer] and are not financially in a position yet to retire.  Income source: Employment Financial concerns: Yes, due to illness and/or loss of work during treatment Type of concern:  at present the couple is financially doing okay, but if pt needs to stop working it could result in financial strain. Food access concerns: no Religious or spiritual practice: Not known Services Currently in place:  none  Coping/ Adjustment to diagnosis: Patient understands treatment plan and what happens next? yes Concerns about diagnosis and/or treatment: Losing my  job and/or losing income, Overwhelmed by information, How will I care for myself, and Quality of life Patient reported stressors: Finances, Anxiety/ nervousness, and Adjusting to my illness Hopes and/or priorities: Pt's priority is to continue treatment w/ the hope of positive results. Patient enjoys time with family/ friends Current coping skills/ strengths: Ability for insight , Average or above average intelligence , Capable of independent living , Communication skills , General fund of knowledge , Motivation for treatment/growth , Physical Health , and Supportive family/friends     SUMMARY: Current SDOH Barriers:  No barriers identified at this time.  Clinical Social Work Clinical Goal(s):  No clinical social work goals at this time  Interventions: Discussed common feeling and emotions when being diagnosed with cancer, and the importance of support during treatment Informed patient of the support team roles and support services at [facility] Provided [place] contact information and encouraged patient to call with any questions or concerns Referred patient to Scott [name], emailed information for [facility] to assist pt w/ exploring Medicare, emailed supportive services as well as a flyer for [employer], provided contact information for the [employer] in the event pt needs DME in the future.   Provided pt w/ details about the [facility] with [facility].  Pt's spouse will call to book an appointment to have Advanced [facility] completed.   Follow Up Plan: Patient will contact CSW with any support or resource needs Patient verbalizes understanding of plan: Yes    Scott Combs, LCSW

## 2022-05-06 ENCOUNTER — Other Ambulatory Visit: Payer: Self-pay

## 2022-05-07 ENCOUNTER — Inpatient Hospital Stay: Payer: BC Managed Care – PPO

## 2022-05-07 ENCOUNTER — Inpatient Hospital Stay (HOSPITAL_BASED_OUTPATIENT_CLINIC_OR_DEPARTMENT_OTHER): Payer: BC Managed Care – PPO | Admitting: Hematology

## 2022-05-07 VITALS — BP 131/72 | HR 49 | Temp 97.7°F | Resp 16

## 2022-05-07 DIAGNOSIS — Z9079 Acquired absence of other genital organ(s): Secondary | ICD-10-CM | POA: Diagnosis not present

## 2022-05-07 DIAGNOSIS — Z79899 Other long term (current) drug therapy: Secondary | ICD-10-CM | POA: Diagnosis not present

## 2022-05-07 DIAGNOSIS — M810 Age-related osteoporosis without current pathological fracture: Secondary | ICD-10-CM | POA: Diagnosis not present

## 2022-05-07 DIAGNOSIS — C61 Malignant neoplasm of prostate: Secondary | ICD-10-CM

## 2022-05-07 DIAGNOSIS — M25552 Pain in left hip: Secondary | ICD-10-CM | POA: Diagnosis not present

## 2022-05-07 DIAGNOSIS — Z923 Personal history of irradiation: Secondary | ICD-10-CM | POA: Diagnosis not present

## 2022-05-07 DIAGNOSIS — N1330 Unspecified hydronephrosis: Secondary | ICD-10-CM | POA: Diagnosis not present

## 2022-05-07 DIAGNOSIS — M25551 Pain in right hip: Secondary | ICD-10-CM | POA: Diagnosis not present

## 2022-05-07 DIAGNOSIS — G629 Polyneuropathy, unspecified: Secondary | ICD-10-CM | POA: Diagnosis not present

## 2022-05-07 DIAGNOSIS — Z87891 Personal history of nicotine dependence: Secondary | ICD-10-CM | POA: Diagnosis not present

## 2022-05-07 DIAGNOSIS — C7951 Secondary malignant neoplasm of bone: Secondary | ICD-10-CM | POA: Diagnosis present

## 2022-05-07 DIAGNOSIS — R232 Flushing: Secondary | ICD-10-CM | POA: Diagnosis not present

## 2022-05-07 DIAGNOSIS — Z5111 Encounter for antineoplastic chemotherapy: Secondary | ICD-10-CM | POA: Diagnosis not present

## 2022-05-07 DIAGNOSIS — E039 Hypothyroidism, unspecified: Secondary | ICD-10-CM | POA: Diagnosis not present

## 2022-05-07 DIAGNOSIS — Z5189 Encounter for other specified aftercare: Secondary | ICD-10-CM | POA: Diagnosis not present

## 2022-05-07 DIAGNOSIS — I10 Essential (primary) hypertension: Secondary | ICD-10-CM | POA: Diagnosis not present

## 2022-05-07 LAB — CBC WITH DIFFERENTIAL/PLATELET
Abs Immature Granulocytes: 0.03 10*3/uL (ref 0.00–0.07)
Basophils Absolute: 0.1 10*3/uL (ref 0.0–0.1)
Basophils Relative: 1 %
Eosinophils Absolute: 0.1 10*3/uL (ref 0.0–0.5)
Eosinophils Relative: 3 %
HCT: 33 % — ABNORMAL LOW (ref 39.0–52.0)
Hemoglobin: 11.8 g/dL — ABNORMAL LOW (ref 13.0–17.0)
Immature Granulocytes: 1 %
Lymphocytes Relative: 19 %
Lymphs Abs: 1.1 10*3/uL (ref 0.7–4.0)
MCH: 32.2 pg (ref 26.0–34.0)
MCHC: 35.8 g/dL (ref 30.0–36.0)
MCV: 90.2 fL (ref 80.0–100.0)
Monocytes Absolute: 0.6 10*3/uL (ref 0.1–1.0)
Monocytes Relative: 11 %
Neutro Abs: 3.8 10*3/uL (ref 1.7–7.7)
Neutrophils Relative %: 65 %
Platelets: 184 10*3/uL (ref 150–400)
RBC: 3.66 MIL/uL — ABNORMAL LOW (ref 4.22–5.81)
RDW: 11.9 % (ref 11.5–15.5)
WBC: 5.7 10*3/uL (ref 4.0–10.5)
nRBC: 0 % (ref 0.0–0.2)

## 2022-05-07 LAB — COMPREHENSIVE METABOLIC PANEL
ALT: 12 U/L (ref 0–44)
AST: 18 U/L (ref 15–41)
Albumin: 3.6 g/dL (ref 3.5–5.0)
Alkaline Phosphatase: 76 U/L (ref 38–126)
Anion gap: 7 (ref 5–15)
BUN: 14 mg/dL (ref 8–23)
CO2: 23 mmol/L (ref 22–32)
Calcium: 8.9 mg/dL (ref 8.9–10.3)
Chloride: 103 mmol/L (ref 98–111)
Creatinine, Ser: 0.83 mg/dL (ref 0.61–1.24)
GFR, Estimated: >60 mL/min (ref >60)
Glucose, Bld: 105 mg/dL — ABNORMAL HIGH (ref 70–99)
Potassium: 3.8 mmol/L (ref 3.5–5.1)
Sodium: 133 mmol/L — ABNORMAL LOW (ref 135–145)
Total Bilirubin: 0.8 mg/dL (ref 0.3–1.2)
Total Protein: 6.4 g/dL — ABNORMAL LOW (ref 6.5–8.1)

## 2022-05-07 LAB — PSA: Prostatic Specific Antigen: 53.59 ng/mL — ABNORMAL HIGH (ref 0.00–4.00)

## 2022-05-07 LAB — MAGNESIUM: Magnesium: 2.1 mg/dL (ref 1.7–2.4)

## 2022-05-07 MED ORDER — PREDNISONE 5 MG PO TABS: 5.0000 mg | ORAL_TABLET | Freq: Every day | ORAL | 5 refills | Status: DC

## 2022-05-07 MED ORDER — PALONOSETRON HCL INJECTION 0.25 MG/5ML
0.2500 mg | Freq: Once | INTRAVENOUS | Status: AC
  Administered 2022-05-07: 0.25 mg via INTRAVENOUS
  Filled 2022-05-07: qty 5

## 2022-05-07 MED ORDER — SODIUM CHLORIDE 0.9 % IV SOLN
20.0000 mg/m2 | Freq: Once | INTRAVENOUS | Status: AC
  Administered 2022-05-07: 38 mg via INTRAVENOUS
  Filled 2022-05-07: qty 3.8

## 2022-05-07 MED ORDER — PROCHLORPERAZINE MALEATE 10 MG PO TABS: 10.0000 mg | ORAL_TABLET | Freq: Four times a day (QID) | ORAL | 3 refills | Status: DC | PRN

## 2022-05-07 MED ORDER — PREDNISONE 10 MG PO TABS: 10.0000 mg | ORAL_TABLET | Freq: Every day | ORAL | 3 refills | Status: DC

## 2022-05-07 MED ORDER — SODIUM CHLORIDE 0.9 % IV SOLN: Freq: Once | INTRAVENOUS | Status: AC

## 2022-05-07 MED ORDER — PROCHLORPERAZINE MALEATE 10 MG PO TABS: 10.0000 mg | ORAL_TABLET | Freq: Four times a day (QID) | ORAL | 1 refills | Status: DC | PRN

## 2022-05-07 MED ORDER — FAMOTIDINE IN NACL 20-0.9 MG/50ML-% IV SOLN
20.0000 mg | Freq: Once | INTRAVENOUS | Status: AC
  Administered 2022-05-07: 20 mg via INTRAVENOUS
  Filled 2022-05-07: qty 50

## 2022-05-07 MED ORDER — SODIUM CHLORIDE 0.9 % IV SOLN
10.0000 mg | Freq: Once | INTRAVENOUS | Status: AC
  Administered 2022-05-07: 10 mg via INTRAVENOUS
  Filled 2022-05-07: qty 1

## 2022-05-07 MED ORDER — DIPHENHYDRAMINE HCL 50 MG/ML IJ SOLN: 25.0000 mg | Freq: Once | INTRAMUSCULAR | Status: DC

## 2022-05-07 MED ORDER — CETIRIZINE HCL 10 MG/ML IV SOLN
10.0000 mg | Freq: Once | INTRAVENOUS | Status: AC
  Administered 2022-05-07: 10 mg via INTRAVENOUS
  Filled 2022-05-07: qty 1

## 2022-05-07 NOTE — Progress Notes (Signed)
Pharmacist Chemotherapy Monitoring - Initial Assessment    Anticipated start date: 05/07/22   The following has been reviewed per standard work regarding the patient's treatment regimen: The patient's diagnosis, treatment plan and drug doses, and organ/hematologic function Lab orders and baseline tests specific to treatment regimen  The treatment plan start date, drug sequencing, and pre-medications Prior authorization status  Patient's documented medication list, including drug-drug interaction screen and prescriptions for anti-emetics and supportive care specific to the treatment regimen The drug concentrations, fluid compatibility, administration routes, and timing of the medications to be used The patient's access for treatment and lifetime cumulative dose history, if applicable  The patient's medication allergies and previous infusion related reactions, if applicable   Changes made to treatment plan:  N/A  Follow up needed:  N/A   Wynona Neat, [facility], 05/07/2022  10:52 AM

## 2022-05-07 NOTE — Progress Notes (Signed)
Discontinue diphenhydramine from oncology treatment plan --> Add Quzyttir (cetirizine) 10 mg IVPush x 1 as premedication for oncology treatment plan.  T.O. Dr Rhys Martini, PharmD

## 2022-05-07 NOTE — Patient Instructions (Signed)
[facility]  Discharge Instructions: Thank you for choosing [facility] to provide your oncology and hematology care.  If you have a lab appointment with the [facility], please come in thru the Main Entrance and check in at the main information desk.  Wear comfortable clothing and clothing appropriate for easy access to any Portacath or PICC line.   We strive to give you quality time with your provider. You may need to reschedule your appointment if you arrive late (15 or more minutes).  Arriving late affects you and other patients whose appointments are after yours.  Also, if you miss three or more appointments without notifying the office, you may be dismissed from the clinic at the provider's discretion.      For prescription refill requests, have your pharmacy contact our office and allow 72 hours for refills to be completed.    Today you received the following chemotherapy and/or immunotherapy agents jevtana   To help prevent nausea and vomiting after your treatment, we encourage you to take your nausea medication as directed.  BELOW ARE SYMPTOMS THAT SHOULD BE REPORTED IMMEDIATELY: *FEVER GREATER THAN 100.4 F (38 C) OR HIGHER *CHILLS OR SWEATING *NAUSEA AND VOMITING THAT IS NOT CONTROLLED WITH YOUR NAUSEA MEDICATION *UNUSUAL SHORTNESS OF BREATH *UNUSUAL BRUISING OR BLEEDING *URINARY PROBLEMS (pain or burning when urinating, or frequent urination) *BOWEL PROBLEMS (unusual diarrhea, constipation, pain near the anus) TENDERNESS IN MOUTH AND THROAT WITH OR WITHOUT PRESENCE OF ULCERS (sore throat, sores in mouth, or a toothache) UNUSUAL RASH, SWELLING OR PAIN  UNUSUAL VAGINAL DISCHARGE OR ITCHING   Items with * indicate a potential emergency and should be followed up as soon as possible or go to the Emergency Department if any problems should occur.  Please show the CHEMOTHERAPY ALERT CARD or IMMUNOTHERAPY ALERT CARD at check-in to the Emergency  Department and triage nurse.  Should you have questions after your visit or need to cancel or reschedule your appointment, please contact [facility] [phone]  and follow the prompts.  Office hours are 8:00 a.m. to 4:30 p.m. Monday - Friday. Please note that voicemails left after 4:00 p.m. may not be returned until the following business day.  We are closed weekends and major holidays. You have access to a nurse at all times for urgent questions. Please call the main number to the clinic [phone] and follow the prompts.  For any non-urgent questions, you may also contact your provider using MyChart. We now offer e-Visits for anyone 48 and older to request care online for non-urgent symptoms. For details visit [email or web address].   Also download the MyChart app! Go to the app store, search "MyChart", open the app, select [facility], and log in with your MyChart username and password.

## 2022-05-07 NOTE — Progress Notes (Signed)
[facility] [address] [place] [address]    Clinic Day:  05/07/2022  Referring physician: Janith Lima, MD  [name] Care Team: Janith Lima, MD as PCP - General (Internal Medicine) [name], MD as Consulting Physician (Hematology Scott [facility]cology) Gala Romney Cristopher Estimable, MD as Consulting Physician (Gastroenterology) Brien Mates, RN as Oncology Nurse Navigator (Medical Oncology)   ASSESSMENT & PLAN:   Assessment: 1.  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  Prominent left hydronephrosis Scott hydroureter noted.  New sclerotic lesion in Scott left anterior vertebral body of L1.  Mild left periaortic adenopathy increased from prior, short axis lymph node measuring 1 cm. -Last PSA increased to 15.8 on 09/28/2019. -As there is rapid progression of disease, I have recommended docetaxel chemotherapy.  He is agreeable after long discussion. -Cystoscopy with transurethral resection of tumor in Scott posterior bladder neck, right Scott left walls of Scott bladder, right lobe of Scott prostate on 11/10/2019. -Plan was to do 6 cycles of docetaxel followed by abiraterone Scott prednisone. -6 cycles of docetaxel from 11/16/2019 through 02/29/2020. -CTAP on 01/06/2020 showed persistent thickening of Scott urinary bladder wall.  Left hydronephrosis is significantly reduced compared to prior exam.  Left-sided retroperitoneal Scott left iliac lymph nodes reduced in size.  Interval increase in sclerosis of lesions of Scott left aspect of L2 vertebral body consistent with treatment response.  No new bone lesions. -Bone scan on 01/06/2020 shows stable L2 metastatic focus. -Bone scan scan images from 06/14/2020 which showed progressive metastatic disease, with increased size Scott intensity of L2 1 new focus of activity at  L1. 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 -Abiraterone Scott prednisone from 06/24/2020 through 09/06/2021 with progression. - PSMA PET scan (09/28/2021): Residual/recurrent tumor involving prostate gland Scott seminal vesicles.  Tracer avid bone metastasis localized L1 Scott L2 vertebral bodies.  No new sites of bone metastasis disease.  No solid organ or nodal mets. - Darolutamide from 10/17/2021 through 03/15/2022 with progression - Guardant360: MSI high not detected.  Androgen resistance present. - XRT to L2-L4, 30 Gray in 10 fractions completed on 04/23/2022 with improvement in pain. - Cabazitaxel cycle 1 on 05/07/2022.   2.  Left hydroureteronephrosis: -CT scan showed prostate mass invading Scott posterior bladder Scott obstructing Scott ureter.  Creatinine increased to 1.1. -Renal ultrasound on 12/11/2019 showed interval resolution of previously seen left-sided hydronephrosis.  Previously seen soft tissue mass at Scott posterior bladder lumen no longer seen.  Plan: 1.  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  He had minor side effects with diarrhea alternating with constipation. - We reviewed pathology report from L4 biopsy on 04/09/2022: Metastatic prostatic adenocarcinoma. - NGS panel results are pending at this time. - We talked about initiating chemotherapy with cabazitaxel.  We discussed side effects in detail. - We reviewed his labs today which showed normal LFTs Scott creatinine.  CBC was grossly normal.  Proceed with cycle 1 today.  RTC 3 weeks for follow-up.     2.  Hot flashes: - Continue estradiol patch 0.1 mg twice weekly for hot flashes.   3.  Peripheral neuropathy: - Left foot tingling is stable.   4.  Lower back pain/bilateral hip pains: - Status post XRT to  L2-L4 30 Gray in 10 fractions completed on 04/23/2022 with improvement in pain. - Continue tramadol 100 mg half tablet at bedtime to sleep.  5.   Osteoporosis (DEXA scan 02/24/2021 T-score -2.5): - We have previously discussed role of denosumab/zoledronic acid.  He has poor dentition Scott hence decided against it.  Continue calcium Scott vitamin D supplements.  No orders of Scott defined types were placed in this encounter.     Beverly Gust Oliver,acting as a scribe for [name], MD.,have documented all relevant documentation on Scott behalf of [name], MD,as directed by  [name], MD while in Scott presence of [name], MD.   I, [name] MD, have reviewed Scott above documentation for accuracy Scott completeness, Scott I agree with Scott above.   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   Prior Therapy: 1. TURP on 11/10/2019. 2. Docetaxel x 6 cycles from 11/16/2019 to 02/29/2020. 3.  Abiraterone Scott prednisone 4.  Darolutamide  Current Therapy: Cabazitaxel every 21 days   HISTORY OF PRESENT ILLNESS:   Oncology History  Prostate cancer metastatic to multiple sites [facility])  01/25/2017 Initial Diagnosis   Prostate cancer metastatic to multiple sites [facility]   11/16/2019 - 03/02/2020 Chemotherapy   [name] is on Treatment Plan : PROSTATE Docetaxel + Prednisone q21d      Genetic Testing   Negative genetic testing. No pathogenic variants identified on Scott [facility] CancerNext-Expanded panel. 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 Scott report date is 04/19/2020.  Scott CancerNext-Expanded  gene panel offered by [facility] Scott includes sequencing Scott rearrangement analysis for Scott following 77 genes: IP, ALK, APC*, ATM*, AXIN2, BAP1, BARD1, BLM, BMPR1A, BRCA1*, BRCA2*, BRIP1*, CDC73, CDH1*,CDK4, CDKN1B, CDKN2A, CHEK2*, CTNNA1, DICER1, FANCC, FH, FLCN, GALNT12, KIF1B, LZTR1, MAX, MEN1, MET, MLH1*, MSH2*, MSH3, MSH6*, MUTYH*, NBN,  NF1*, NF2, NTHL1, PALB2*, PHOX2B, PMS2*, POT1, PRKAR1A, PTCH1, PTEN*, RAD51C*, RAD51D*,RB1, RECQL, RET, SDHA, SDHAF2, SDHB, SDHC, SDHD, SMAD4, SMARCA4, SMARCB1, SMARCE1, STK11, SUFU, TMEM127, TP53*,TSC1, TSC2, VHL Scott XRCC2 (sequencing Scott deletion/duplication); EGFR, EGLN1, HOXB13, KIT, MITF, PDGFRA, POLD1 Scott POLE (sequencing only); EPCAM Scott GREM1 (deletion/duplication only).    05/07/2022 -  Chemotherapy   [name] is on Treatment Plan : PROSTATE Cabazitaxel (20) D1 + Prednisone D1-21 q21d        INTERVAL HISTORY:   Deavonte is a 67 y.o. 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 He was last seen by me on 04/04/2022.  Today, he states that he is doing well overall. His appetite level is at 70%. His energy level is at 50%.He 4/10 tail bone/lower back pain. He states that everything has been fine since radiation. He received two week of radiation.However, he did have diarrhea Scott constipation. This has improved along with his pain.                                              PAST MEDICAL HISTORY:   Past Medical History: Past Medical History:  Diagnosis Date   Bladder obstruction    Complication of anesthesia    Diverticulitis 2015   Dyspnea    Essential hypertension, benign 01/26/2019   at [facility] office elevated, normal  at home, no medications at this time   History of basal cell cancer    HLD (hyperlipidemia) 01/26/2019   Hypothyroidism    history of, not currently taking medication   PONV (postoperative nausea Scott vomiting)    Prostate cancer ([place])    metastatic to bones   Vitamin D deficiency disease 01/26/2019    Surgical History: Past Surgical History:  Procedure Laterality Date   CYSTOSCOPY W/ URETERAL STENT PLACEMENT Left 11/10/2019   Procedure: CYSTOSCOPY;  Surgeon: Irine Seal, MD;  Location: [facility];  Service: Urology;  Laterality: Left;   ESOPHAGOGASTRODUODENOSCOPY (EGD) WITH PROPOFOL N/A 07/07/2020    Procedure: ESOPHAGOGASTRODUODENOSCOPY (EGD) WITH PROPOFOL;  Surgeon: Daneil Dolin, MD;  Location: [facility];  Service: Endoscopy;  Laterality: N/A;  pm appt   HERNIA REPAIR  [id number]   UMBILICAL    IR FLUORO GUIDED NEEDLE PLC ASPIRATION/INJECTION LOC  04/09/2022   MALONEY DILATION N/A 07/07/2020   Procedure: MALONEY DILATION;  Surgeon: Daneil Dolin, MD;  Location: [facility];  Service: Endoscopy;  Laterality: N/A;   PROSTATE SURGERY     SKIN CANCER EXCISION     TONSILLECTOMY Bilateral    TOOTH EXTRACTION     front left tooth pulled but no surgery or stitches   TRANSURETHRAL RESECTION OF PROSTATE N/A 01/25/2017   Procedure: TRANSURETHRAL RESECTION OF Scott PROSTATE (TURP);  Surgeon: Alexis Frock, MD;  Location: [facility];  Service: Urology;  Laterality: N/A;   TRANSURETHRAL RESECTION OF PROSTATE N/A 11/10/2019   Procedure: TRANSURETHRAL RESECTION OF Scott PROSTATE (TURP);  Surgeon: Irine Seal, MD;  Location: [facility];  Service: Urology;  Laterality: N/A;   WISDOM TOOTH EXTRACTION      Social History: Social History   Socioeconomic History   Marital status: Married    Spouse name: Not on file   Number of children: 1   Years of education: Not on file   Highest education level: Not on file  Occupational History    Comment: working full time  Tobacco Use   Smoking status: Former    Packs/day: 1.50    Years: 35.00    Total pack years: 52.50    Types: Cigarettes    Quit date: 01/12/2004    Years since quitting: 18.3   Smokeless tobacco: Never   Tobacco comments:    OVER 30 YEARS HX OF SMOKING   Vaping Use   Vaping Use: Never used  Substance Scott Sexual Activity   Alcohol use: Not Currently    Comment: heavy drinker until 2004   Drug use: Not Currently    Types: Marijuana   Sexual activity: Not Currently  Other Topics Concern   Not on file  Social History Narrative   Married for 19 years.Works for [employer] from home.   Social Determinants of Health   Financial  Resource Strain: Low Risk  (03/02/2020)   Overall Financial Resource Strain (CARDIA)    Difficulty of Paying Living Expenses: Not hard at all  Food Insecurity: No Food Insecurity (03/02/2020)   Hunger Vital Sign    Worried About Running Out of Food in Scott Last Year: Never true    Ran Out of Food in Scott Last Year: Never true  Transportation Needs: No Transportation Needs (03/02/2020)   PRAPARE - Hydrologist (Medical): No    Lack of Transportation (Non-Medical): No  Physical Activity: Inactive (03/02/2020)   Exercise Vital Sign    Days of Exercise per Week: 0 days  Minutes of Exercise per Session: 0 min  Stress: No Stress Concern Present (03/02/2020)   [place]    Feeling of Stress : Only a little  Social Connections: Moderately Integrated (03/02/2020)   Social Connection Scott Isolation Panel [NHANES]    Frequency of Communication with Friends Scott Family: Three times a week    Frequency of Social Gatherings with Friends Scott Family: Never    Attends Religious Services: Never    Marine scientist or Organizations: Yes    Attends Archivist Meetings: 1 to 4 times per year    Marital Status: Married  Human resources officer Violence: Not At Risk (03/02/2020)   Humiliation, Afraid, Rape, Scott Kick questionnaire    Fear of Current or Ex-Partner: No    Emotionally Abused: No    Physically Abused: No    Sexually Abused: No    Family History: Family History  Problem Relation Age of Onset   Emphysema Mother    Alzheimer's disease Father    Prostate cancer Father        dx in his 73s   Prostate cancer Brother        dx in his 49s   Breast cancer Maternal Grandmother        60s   Pancreatic cancer Maternal Grandfather        59s   Dementia Paternal Grandmother    Heart attack Paternal Grandfather    Colon cancer Neg Hx     Current Medications:  Current Outpatient  Medications:    arginine 500 MG tablet, Take 500 mg by mouth 2 (two) times daily., Disp: , Rfl:    Cholecalciferol 125 MCG (5000 UT) capsule, Take 5,000-10,000 Units by mouth See admin instructions. Take 10000 units in Scott morning Scott 5000 units at night, Disp: , Rfl:    DANDELION ROOT PO, Take 500 mg by mouth daily., Disp: , Rfl:    dutasteride (AVODART) 0.5 MG capsule, Take 1 capsule (0.5 mg total) by mouth daily., Disp: 90 capsule, Rfl: 3   estradiol (VIVELLE-DOT) 0.1 MG/24HR patch, APPLY 1 PATCH(0.1 MG) TOPICALLY TO Scott SKIN 2 TIMES A WEEK, Disp: 8 patch, Rfl: 12   ibuprofen (ADVIL) 200 MG tablet, Take 400 mg by mouth every 6 (six) hours as needed for moderate pain., Disp: , Rfl:    MAGNESIUM PO, Take 1 tablet by mouth at bedtime., Disp: , Rfl:    MELATONIN PO, Take 40 mg by mouth at bedtime., Disp: , Rfl:    MILK THISTLE PO, Take 1 capsule by mouth daily., Disp: , Rfl:    NATTOKINASE PO, Take 2 capsules by mouth daily., Disp: , Rfl:    NP THYROID 120 MG tablet, Take 0.5 tablets (60 mg total) by mouth every morning., Disp: 45 tablet, Rfl: 1   olmesartan (BENICAR) 20 MG tablet, TAKE 1 TABLET(20 MG) BY MOUTH DAILY, Disp: 90 tablet, Rfl: 1   ondansetron (ZOFRAN ODT) 8 MG disintegrating tablet, Take 1 tablet (8 mg total) by mouth every 8 (eight) hours as needed for nausea or vomiting., Disp: 60 tablet, Rfl: 3   predniSONE (DELTASONE) 5 MG tablet, Take 1 tablet (5 mg total) by mouth daily with breakfast., Disp: 30 tablet, Rfl: 5   prochlorperazine (COMPAZINE) 10 MG tablet, Take 1 tablet (10 mg total) by mouth every 6 (six) hours as needed for nausea or vomiting., Disp: 30 tablet, Rfl: 3   QUERCETIN PO, Take 1 tablet by mouth 2 (  two) times daily., Disp: , Rfl:    thiamine (VITAMIN B-1) 50 MG tablet, Take 1 tablet (50 mg total) by mouth daily., Disp: 90 tablet, Rfl: 1   traMADol HCl 100 MG TABS, Take 100 mg by mouth at bedtime as needed., Disp: 60 tablet, Rfl: 0   VITAMIN K PO, Take 1 capsule by  mouth every other day., Disp: , Rfl:    Zinc 30 MG CAPS, Take 30 mg by mouth daily., Disp: , Rfl:    predniSONE (DELTASONE) 10 MG tablet, Take 1 tablet (10 mg total) by mouth daily., Disp: 30 tablet, Rfl: 3   prochlorperazine (COMPAZINE) 10 MG tablet, Take 1 tablet (10 mg total) by mouth every 6 (six) hours as needed for nausea or vomiting., Disp: 60 tablet, Rfl: 1   Allergies: Allergies  Allergen Reactions   Crestor [Rosuvastatin] Other (See Comments)    myalgia   Ivp Dye [Iodinated Contrast Media] Swelling    Swelling on head, took Benadryl Scott swelling reduced   Wellbutrin [Bupropion] Hives    REVIEW OF SYSTEMS:   Review of Systems  Constitutional:  Negative for chills, fatigue Scott fever.  HENT:   Negative for lump/mass, mouth sores, nosebleeds, sore throat Scott trouble swallowing.   Eyes:  Negative for eye problems.  Respiratory:  Positive for shortness of breath. Negative for cough.   Cardiovascular:  Negative for chest pain, leg swelling Scott palpitations.  Gastrointestinal:  Positive for constipation Scott diarrhea. Negative for abdominal pain, nausea Scott vomiting.  Genitourinary:  Positive for frequency. Negative for bladder incontinence, difficulty urinating, dysuria, hematuria Scott nocturia.   Musculoskeletal:  Negative for arthralgias, back pain, flank pain, myalgias Scott neck pain.  Skin:  Negative for itching Scott rash.  Neurological:  Positive for light-headedness, numbness (Tingling left foot) Scott seizures. Negative for dizziness Scott headaches.  Hematological:  Does not bruise/bleed easily.  Psychiatric/Behavioral:  Negative for depression, sleep disturbance Scott suicidal ideas. Scott [name] is not nervous/anxious.   All other systems reviewed Scott are negative.    VITALS:   Blood pressure 134/74, pulse (!) 59, temperature (!) 97.1 F (36.2 C), temperature source Tympanic, resp. rate 17, height 5' 6"$  (1.676 m), weight 173 lb 1.6 oz (78.5 kg), SpO2 97 %.  Wt Readings from  Last 3 Encounters:  05/07/22 173 lb 1.6 oz (78.5 kg)  04/09/22 173 lb (78.5 kg)  04/04/22 175 lb 12.8 oz (79.7 kg)    Body mass index is 27.94 kg/m.  Performance status (ECOG): 1 - Symptomatic but completely ambulatory  PHYSICAL EXAM:   Physical Exam Vitals Scott nursing note reviewed. Exam conducted with a chaperone present.  Constitutional:      Appearance: Normal appearance.  Cardiovascular:     Rate Scott Rhythm: Normal rate Scott regular rhythm.     Pulses: Normal pulses.     Heart sounds: Normal heart sounds.  Pulmonary:     Effort: Pulmonary effort is normal.     Breath sounds: Normal breath sounds.  Abdominal:     Palpations: Abdomen is soft. There is no hepatomegaly, splenomegaly or mass.     Tenderness: There is no abdominal tenderness.  Musculoskeletal:     Right lower leg: No edema.     Left lower leg: No edema.  Lymphadenopathy:     Cervical: No cervical adenopathy.     Right cervical: No superficial, deep or posterior cervical adenopathy.    Left cervical: No superficial, deep or posterior cervical adenopathy.     Upper Body:  Right upper body: No supraclavicular or axillary adenopathy.     Left upper body: No supraclavicular or axillary adenopathy.  Neurological:     General: No focal deficit present.     Mental Status: He is alert Scott oriented to person, place, Scott time.  Psychiatric:        Mood Scott Affect: Mood normal.        Behavior: Behavior normal.     LABS:      Latest Ref Rng & Units 05/07/2022    8:51 AM 04/09/2022   11:47 AM 03/08/2022    2:00 PM  CBC  WBC 4.0 - 10.5 K/uL 5.7  7.7  6.4   Hemoglobin 13.0 - 17.0 g/dL 11.8  12.4  12.6   Hematocrit 39.0 - 52.0 % 33.0  34.6  37.1   Platelets 150 - 400 K/uL 184  227  221       Latest Ref Rng & Units 05/07/2022    8:51 AM 03/08/2022    2:00 PM 02/22/2022    1:53 PM  CMP  Glucose 70 - 99 mg/dL 105  103  110   BUN 8 - 23 mg/dL 14  16  15   $ Creatinine 0.61 - 1.24 mg/dL 0.83  0.93  1.04    Sodium 135 - 145 mmol/L 133  131  134   Potassium 3.5 - 5.1 mmol/L 3.8  4.2  4.3   Chloride 98 - 111 mmol/L 103  101  101   CO2 22 - 32 mmol/L 23  22  26   $ Calcium 8.9 - 10.3 mg/dL 8.9  9.1  9.4   Total Protein 6.5 - 8.1 g/dL 6.4  6.8    Total Bilirubin 0.3 - 1.2 mg/dL 0.8  1.5    Alkaline Phos 38 - 126 U/L 76  73    AST 15 - 41 U/L 18  16    ALT 0 - 44 U/L 12  14       No results found for: "CEA1", "CEA" / No results found for: "CEA1", "CEA" Lab Results  Component Value Date   PSA1 12.9 (H) 05/26/2020   No results found for: "[id number]" No results found for: "CAN125"  No results found for: "TOTALPROTELP", "ALBUMINELP", "A1GS", "A2GS", "BETS", "BETA2SER", "GAMS", "MSPIKE", "SPEI" Lab Results  Component Value Date   TIBC 336.0 01/09/2021   TIBC 280 04/20/2020   FERRITIN 69.8 01/09/2021   FERRITIN 45 04/20/2020   IRONPCTSAT 45.2 01/09/2021   IRONPCTSAT 31 04/20/2020   No results found for: "LDH"   STUDIES:   IR Fluoro Guide Ndl Plmt / BX  Result Date: 04/09/2022 INDICATION: Prostate cancer metastatic to multiple sites [facility]) C61 (ICD-10-CM). Stage IV Prostate Cancer, new L4 lesion. Malignant neoplasm of prostate metastatic to bone (HCC) C61, C79.51 (ICD-10-CM). EXAM: FLUOROSCOPY GUIDED L4 CORE BONE BIOPSY MEDICATIONS: Ancef 2 g IV. ANESTHESIA/SEDATION: Moderate (conscious) sedation was employed during this procedure. A total of Versed 4 mg Scott Fentanyl 100 mcg were administered intravenously for moderate conscious sedation monitored under my direct supervision. Total intraservice time of sedation was 25 minute. Scott [name]'s vital signs were monitored throughout Scott procedure Scott recorded in Scott [name]'s medical record by Scott radiology nurse. Fluoroscopy radiation exposure: Peak skin dose 463 mGy. COMPLICATIONS: [name] immediate. PROCEDURE: Informed written consent was obtained from Scott [name] after a thorough discussion of Scott procedural risks, benefits Scott alternatives. All  questions were addressed. Maximal Sterile Barrier Technique was utilized including caps, mask, sterile gowns, sterile gloves,  sterile drape, hand hygiene Scott skin antiseptic. A timeout was performed prior to Scott initiation of Scott procedure. Scott [name] was placed in prone position on Scott angiography table. Scott lumbar spine region was prepped Scott draped in a sterile fashion. Under fluoroscopy, Scott L4 vertebral body was delineated Scott Scott skin area was marked. Scott skin was infiltrated with a 1% Lidocaine approximately 8 cm lateral to Scott spinous process projection on Scott right. Using a 22-gauge spinal needle, Scott soft issue Scott Scott peripedicular space Scott periosteum were infiltrated with Bupivacaine 0.5%. A skin incision was made at Scott access site. Subsequently, an 11-gauge Kyphon trocar was inserted under fluoroscopic guidance until contact with Scott pedicle was obtained. Scott trocar was inserted under light hammer tapping into Scott pedicle until Scott posterior boundaries of Scott vertebral body was reached. Scott diamond mandrill was removed Scott one large core biopsy sample was obtained from Scott posteromedial aspect of Scott vertebral body. Additional passes for attempted retrieving only bloody content. Scott trocar was later removed. Scott access site was cleaned Scott covered with a sterile bandage. IMPRESSION: Successful fluoroscopy guided L4 core bone biopsy. Electronically Signed   By: Pedro Earls M.D.   On: 04/09/2022 15:40

## 2022-05-07 NOTE — Progress Notes (Signed)
Patient has been examined by Dr. Katragadda, and vital signs and labs have been reviewed. ANC, Creatinine, LFTs, hemoglobin, and platelets are within treatment parameters per M.D. - pt may proceed with treatment.  Primary RN and pharmacy notified.  

## 2022-05-07 NOTE — Patient Instructions (Addendum)
Scott [name] at [facility] Discharge Instructions   You were seen and examined today by Dr. Delton Coombes.  He reviewed the results of your lab work which are normal/stable. Your PSA level from today is pending.   We will proceed with your first treatment of Jevtana today. This you will receive in the clinic once every 3 weeks. You will need to get a white blood cell booster shot on 2 days after each treatment. If the special testing we sent comes back "MSI high" we can switch you to treatment with an immunotherapy drug called Keytruda. This is a targeted therapy that works with your immune system to kill the cancer, and does not cause the typical side effects of chemotherapy. This is also a treatment given every 3 weeks in the clinic.   With the Jevtana regimen, you will need to take prednisone 5 mg daily.   We sent a prescription for Compazine for nausea to your pharmacy. Take this pill around 4 am when you wake up to go to the bathroom to help control your morning nausea upon waking.   Return as scheduled.    Thank you for choosing [place] at [facility] to provide your oncology and hematology care.  To afford each patient quality time with our provider, please arrive at least 15 minutes before your scheduled appointment time.   If you have a lab appointment with the [facility] please come in thru the Main Entrance and check in at the main information desk.  You need to re-schedule your appointment should you arrive 10 or more minutes late.  We strive to give you quality time with our providers, and arriving late affects you and other patients whose appointments are after yours.  Also, if you no show three or more times for appointments you may be dismissed from the clinic at the providers discretion.     Again, thank you for choosing [facility].  Our hope is that these requests will decrease the amount of time that you wait  before being seen by our physicians.       _____________________________________________________________  Should you have questions after your visit to [facility], please contact our office at [phone] and follow the prompts.  Our office hours are 8:00 a.m. and 4:30 p.m. Monday - Friday.  Please note that voicemails left after 4:00 p.m. may not be returned until the following business day.  We are closed weekends and major holidays.  You do have access to a nurse 24-7, just call the main number to the clinic [phone] and do not press any options, hold on the line and a nurse will answer the phone.    For prescription refill requests, have your pharmacy contact our office and allow 72 hours.    Due to Covid, you will need to wear a mask upon entering the hospital. If you do not have a mask, a mask will be given to you at the Main Entrance upon arrival. For doctor visits, patients may have 1 support person age 39 or older with them. For treatment visits, patients can not have anyone with them due to social distancing guidelines and our immunocompromised population.

## 2022-05-07 NOTE — Progress Notes (Signed)
Labs reviewed with MD today. Ok to proceed with treatment today . Consent obtained.    Treatment given per orders. Patient tolerated it well without problems. Vitals stable and discharged home from clinic ambulatory. Follow up as scheduled.

## 2022-05-08 ENCOUNTER — Telehealth: Payer: Self-pay

## 2022-05-08 NOTE — Telephone Encounter (Signed)
Patient called for 24-hour call-back, patient states he is feeling good today, patient made aware to call cancer center if any symptoms arrive, patient verbalized understanding.

## 2022-05-09 ENCOUNTER — Inpatient Hospital Stay: Payer: BC Managed Care – PPO

## 2022-05-09 VITALS — BP 148/77 | HR 61 | Temp 96.2°F | Resp 18

## 2022-05-09 DIAGNOSIS — Z5111 Encounter for antineoplastic chemotherapy: Secondary | ICD-10-CM | POA: Diagnosis not present

## 2022-05-09 DIAGNOSIS — C61 Malignant neoplasm of prostate: Secondary | ICD-10-CM

## 2022-05-09 MED ORDER — PEGFILGRASTIM-CBQV 6 MG/0.6ML ~~LOC~~ SOSY
6.0000 mg | PREFILLED_SYRINGE | Freq: Once | SUBCUTANEOUS | Status: AC
  Administered 2022-05-09: 6 mg via SUBCUTANEOUS
  Filled 2022-05-09: qty 0.6

## 2022-05-09 NOTE — Progress Notes (Signed)
Patient tolerated injection with no complaints voiced. Site clean and dry with no bruising or swelling noted at site. See MAR for details. Band aid applied.  Patient stable during and after injection. VSS with discharge and left in satisfactory condition with no s/s of distress noted.  

## 2022-05-09 NOTE — Patient Instructions (Signed)
[facility]  Discharge Instructions: Thank you for choosing [facility] to provide your oncology and hematology care.  If you have a lab appointment with the [facility], please come in thru the Main Entrance and check in at the main information desk.  Wear comfortable clothing and clothing appropriate for easy access to any Portacath or PICC line.   We strive to give you quality time with your provider. You may need to reschedule your appointment if you arrive late (15 or more minutes).  Arriving late affects you and other patients whose appointments are after yours.  Also, if you miss three or more appointments without notifying the office, you may be dismissed from the clinic at the provider's discretion.      For prescription refill requests, have your pharmacy contact our office and allow 72 hours for refills to be completed.    Today you received the following U[place], return as scheduled.   To help prevent nausea and vomiting after your treatment, we encourage you to take your nausea medication as directed.  BELOW ARE SYMPTOMS THAT SHOULD BE REPORTED IMMEDIATELY: *FEVER GREATER THAN 100.4 F (38 C) OR HIGHER *CHILLS OR SWEATING *NAUSEA AND VOMITING THAT IS NOT CONTROLLED WITH YOUR NAUSEA MEDICATION *UNUSUAL SHORTNESS OF BREATH *UNUSUAL BRUISING OR BLEEDING *URINARY PROBLEMS (pain or burning when urinating, or frequent urination) *BOWEL PROBLEMS (unusual diarrhea, constipation, pain near the anus) TENDERNESS IN MOUTH AND THROAT WITH OR WITHOUT PRESENCE OF ULCERS (sore throat, sores in mouth, or a toothache) UNUSUAL RASH, SWELLING OR PAIN  UNUSUAL VAGINAL DISCHARGE OR ITCHING   Items with * indicate a potential emergency and should be followed up as soon as possible or go to the Emergency Department if any problems should occur.  Please show the CHEMOTHERAPY ALERT CARD or IMMUNOTHERAPY ALERT CARD at check-in to the Emergency Department and triage  nurse.  Should you have questions after your visit or need to cancel or reschedule your appointment, please contact [facility] [phone]  and follow the prompts.  Office hours are 8:00 a.m. to 4:30 p.m. Monday - Friday. Please note that voicemails left after 4:00 p.m. may not be returned until the following business day.  We are closed weekends and major holidays. You have access to a nurse at all times for urgent questions. Please call the main number to the clinic [phone] and follow the prompts.  For any non-urgent questions, you may also contact your provider using MyChart. We now offer e-Visits for anyone 38 and older to request care online for non-urgent symptoms. For details visit [email or web address].   Also download the MyChart app! Go to the app store, search "MyChart", open the app, select [facility], and log in with your MyChart username and password.

## 2022-05-11 ENCOUNTER — Encounter (HOSPITAL_COMMUNITY): Payer: Self-pay

## 2022-05-17 ENCOUNTER — Ambulatory Visit: Payer: BC Managed Care – PPO

## 2022-05-17 ENCOUNTER — Ambulatory Visit: Payer: BC Managed Care – PPO | Admitting: Hematology

## 2022-05-17 ENCOUNTER — Other Ambulatory Visit: Payer: BC Managed Care – PPO

## 2022-05-17 ENCOUNTER — Inpatient Hospital Stay: Payer: BC Managed Care – PPO | Admitting: Licensed Clinical Social Worker

## 2022-05-17 ENCOUNTER — Encounter: Payer: Self-pay | Admitting: Hematology

## 2022-05-17 DIAGNOSIS — C61 Malignant neoplasm of prostate: Secondary | ICD-10-CM

## 2022-05-17 NOTE — Progress Notes (Signed)
[place] Social Work  Patient presented to [facility]  to review and complete healthcare advance directives.  Clinical Social Worker met with patient and spouse.  The patient designated his spouse Ponciano Lomboy) as their primary healthcare agent and brother Ezriel Harben) as their secondary agent.  Patient also completed healthcare living will.    Documents were notarized and copies made for patient/family. Clinical Social Worker will send documents to medical records to be scanned into patient's chart. Clinical Social Worker encouraged patient/family to contact with any additional questions or concerns.   Henriette Combs, [place] Worker [facility]

## 2022-05-17 NOTE — Progress Notes (Signed)
                                                                                                                                                             Patient Name: Scott [name] MRN: [id number] DOB: 1955/05/11 Referring Physician: [name] Date of Service: 04/23/2022 [facility], [place]                                                        End Of Treatment Note  Diagnoses: C79.51-Secondary malignant neoplasm of bone  Cancer Staging: 67 y.o. 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    Intent: Palliative  Radiation Treatment Dates: 04/10/2022 through 04/23/2022 Site Technique Total Dose (Gy) Dose per Fx (Gy) Completed Fx Beam Energies  Lumbar Spine: Spine IMRT 30/30 3 10/10 10X   Narrative: The patient tolerated radiation therapy relatively well without any significant ill side effects  Plan: The patient will receive a call in about one month from the radiation oncology department. He will continue follow up with Dr. Burr Medico, in medical oncology as well.  ------------------------------------------------   Tyler Pita, MD [place]: [phone]  Fax: [phone] [email or web address]  Skype  [email or web address]

## 2022-05-18 ENCOUNTER — Other Ambulatory Visit: Payer: Self-pay | Admitting: Hematology

## 2022-05-22 ENCOUNTER — Ambulatory Visit
Admission: RE | Admit: 2022-05-22 | Discharge: 2022-05-22 | Disposition: A | Discharge status: Home / Self Care | Payer: BC Managed Care – PPO | Source: Ambulatory Visit | Attending: Radiation Oncology | Admitting: Radiation Oncology

## 2022-05-22 NOTE — Progress Notes (Signed)
  Radiation Oncology         [phone]) [phone] ________________________________  Name: Scott [name] MRN: [id number]  Date of Service: 05/22/2022  DOB: Jul 26, 1955  Post Treatment Telephone Note  Diagnosis:  67 y.o. 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    Intent: Palliative  Radiation Treatment Dates: 04/10/2022 through 04/23/2022 Site Technique Total Dose (Gy) Dose per Fx (Gy) Completed Fx Beam Energies  Lumbar Spine: Spine IMRT 30/30 3 10/10 10X   (as documented in provider EOT note)   The patient was not available for call today. Detailed voicemail left.  The patient is NOT scheduled for ongoing care with Dr. Burr Medico in medical oncology. The patient was encouraged to call if he develops concerns or questions regarding radiation.   Leandra Kern, LPN

## 2022-05-30 ENCOUNTER — Inpatient Hospital Stay: Payer: BC Managed Care – PPO

## 2022-05-30 ENCOUNTER — Inpatient Hospital Stay: Payer: BC Managed Care – PPO | Attending: Hematology

## 2022-05-30 ENCOUNTER — Inpatient Hospital Stay (HOSPITAL_BASED_OUTPATIENT_CLINIC_OR_DEPARTMENT_OTHER): Payer: BC Managed Care – PPO | Admitting: Hematology

## 2022-05-30 VITALS — BP 135/68 | HR 46 | Temp 97.2°F | Resp 18

## 2022-05-30 DIAGNOSIS — G629 Polyneuropathy, unspecified: Secondary | ICD-10-CM | POA: Insufficient documentation

## 2022-05-30 DIAGNOSIS — C61 Malignant neoplasm of prostate: Secondary | ICD-10-CM

## 2022-05-30 DIAGNOSIS — M810 Age-related osteoporosis without current pathological fracture: Secondary | ICD-10-CM | POA: Diagnosis not present

## 2022-05-30 DIAGNOSIS — Z5189 Encounter for other specified aftercare: Secondary | ICD-10-CM | POA: Diagnosis not present

## 2022-05-30 DIAGNOSIS — C7951 Secondary malignant neoplasm of bone: Secondary | ICD-10-CM | POA: Insufficient documentation

## 2022-05-30 DIAGNOSIS — M25551 Pain in right hip: Secondary | ICD-10-CM | POA: Diagnosis not present

## 2022-05-30 DIAGNOSIS — M545 Low back pain, unspecified: Secondary | ICD-10-CM | POA: Diagnosis not present

## 2022-05-30 DIAGNOSIS — Z5111 Encounter for antineoplastic chemotherapy: Secondary | ICD-10-CM | POA: Diagnosis not present

## 2022-05-30 DIAGNOSIS — M25552 Pain in left hip: Secondary | ICD-10-CM | POA: Diagnosis not present

## 2022-05-30 DIAGNOSIS — Z87891 Personal history of nicotine dependence: Secondary | ICD-10-CM | POA: Insufficient documentation

## 2022-05-30 DIAGNOSIS — R232 Flushing: Secondary | ICD-10-CM | POA: Diagnosis not present

## 2022-05-30 DIAGNOSIS — I10 Essential (primary) hypertension: Secondary | ICD-10-CM | POA: Diagnosis not present

## 2022-05-30 LAB — CBC WITH DIFFERENTIAL/PLATELET
Abs Immature Granulocytes: 0.02 10*3/uL (ref 0.00–0.07)
Basophils Absolute: 0.1 10*3/uL (ref 0.0–0.1)
Basophils Relative: 2 %
Eosinophils Absolute: 0 10*3/uL (ref 0.0–0.5)
Eosinophils Relative: 1 %
HCT: 33.7 % — ABNORMAL LOW (ref 39.0–52.0)
Hemoglobin: 11.8 g/dL — ABNORMAL LOW (ref 13.0–17.0)
Immature Granulocytes: 0 %
Lymphocytes Relative: 25 %
Lymphs Abs: 1.3 10*3/uL (ref 0.7–4.0)
MCH: 33 pg (ref 26.0–34.0)
MCHC: 35 g/dL (ref 30.0–36.0)
MCV: 94.1 fL (ref 80.0–100.0)
Monocytes Absolute: 0.7 10*3/uL (ref 0.1–1.0)
Monocytes Relative: 14 %
Neutro Abs: 3.1 10*3/uL (ref 1.7–7.7)
Neutrophils Relative %: 58 %
Platelets: 307 10*3/uL (ref 150–400)
RBC: 3.58 MIL/uL — ABNORMAL LOW (ref 4.22–5.81)
RDW: 13.1 % (ref 11.5–15.5)
WBC: 5.3 10*3/uL (ref 4.0–10.5)
nRBC: 0 % (ref 0.0–0.2)

## 2022-05-30 LAB — MAGNESIUM: Magnesium: 2.2 mg/dL (ref 1.7–2.4)

## 2022-05-30 LAB — COMPREHENSIVE METABOLIC PANEL
ALT: 18 U/L (ref 0–44)
AST: 18 U/L (ref 15–41)
Albumin: 3.8 g/dL (ref 3.5–5.0)
Alkaline Phosphatase: 74 U/L (ref 38–126)
Anion gap: 9 (ref 5–15)
BUN: 19 mg/dL (ref 8–23)
CO2: 23 mmol/L (ref 22–32)
Calcium: 9.1 mg/dL (ref 8.9–10.3)
Chloride: 103 mmol/L (ref 98–111)
Creatinine, Ser: 0.84 mg/dL (ref 0.61–1.24)
GFR, Estimated: >60 mL/min (ref >60)
Glucose, Bld: 100 mg/dL — ABNORMAL HIGH (ref 70–99)
Potassium: 3.9 mmol/L (ref 3.5–5.1)
Sodium: 135 mmol/L (ref 135–145)
Total Bilirubin: 0.7 mg/dL (ref 0.3–1.2)
Total Protein: 6.6 g/dL (ref 6.5–8.1)

## 2022-05-30 LAB — PSA: Prostatic Specific Antigen: 44.4 ng/mL — ABNORMAL HIGH (ref 0.00–4.00)

## 2022-05-30 MED ORDER — SODIUM CHLORIDE 0.9 % IV SOLN: Freq: Once | INTRAVENOUS | Status: AC

## 2022-05-30 MED ORDER — SODIUM CHLORIDE 0.9 % IV SOLN
10.0000 mg | Freq: Once | INTRAVENOUS | Status: AC
  Administered 2022-05-30: 10 mg via INTRAVENOUS
  Filled 2022-05-30: qty 10

## 2022-05-30 MED ORDER — SODIUM CHLORIDE 0.9 % IV SOLN
150.0000 mg | Freq: Once | INTRAVENOUS | Status: AC
  Administered 2022-05-30: 150 mg via INTRAVENOUS
  Filled 2022-05-30: qty 150

## 2022-05-30 MED ORDER — PALONOSETRON HCL INJECTION 0.25 MG/5ML
0.2500 mg | Freq: Once | INTRAVENOUS | Status: AC
  Administered 2022-05-30: 0.25 mg via INTRAVENOUS
  Filled 2022-05-30: qty 5

## 2022-05-30 MED ORDER — FAMOTIDINE IN NACL 20-0.9 MG/50ML-% IV SOLN
20.0000 mg | Freq: Once | INTRAVENOUS | Status: AC
  Administered 2022-05-30: 20 mg via INTRAVENOUS
  Filled 2022-05-30: qty 50

## 2022-05-30 MED ORDER — SODIUM CHLORIDE 0.9 % IV SOLN
20.0000 mg/m2 | Freq: Once | INTRAVENOUS | Status: AC
  Administered 2022-05-30: 38 mg via INTRAVENOUS
  Filled 2022-05-30: qty 3.8

## 2022-05-30 MED ORDER — CETIRIZINE HCL 10 MG/ML IV SOLN
10.0000 mg | Freq: Once | INTRAVENOUS | Status: AC
  Administered 2022-05-30: 10 mg via INTRAVENOUS
  Filled 2022-05-30: qty 1

## 2022-05-30 NOTE — Progress Notes (Signed)
Patient presents today for treatment and follow up visit with Dr. Delton Coombes. Vital signs and labs within parameters for treatment.   Message received from Dr. Delton Coombes to proceed with treatment at this time.   Patient has complaints of nausea after last treatment.   Treatment given today per MD orders. Tolerated infusion without adverse affects. Vital signs stable. No complaints at this time. Discharged from clinic ambulatory in stable condition. Alert and oriented x 3. F/U with [facility] as scheduled.

## 2022-05-30 NOTE — Progress Notes (Signed)
Patient has been examined by Dr. Katragadda. Vital signs and labs have been reviewed by MD - ANC, Creatinine, LFTs, hemoglobin, and platelets are within treatment parameters per M.D. - pt may proceed with treatment.  Primary RN and pharmacy notified.  

## 2022-05-30 NOTE — Progress Notes (Signed)
[facility] [address], [place] [address]    Clinic Day:  05/30/2022  Referring physician: Janith Lima, MD  Patient Care Team: Janith Lima, MD as PCP - General (Internal Medicine) [name], MD as Consulting Physician (Hematology and Oncology) Gala Romney Cristopher Estimable, MD as Consulting Physician (Gastroenterology) Brien Mates, RN as Oncology Nurse Navigator (Medical Oncology)   ASSESSMENT & PLAN:   Assessment: 1.  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  Prominent left hydronephrosis and hydroureter noted.  New sclerotic lesion in the left anterior vertebral body of L1.  Mild left periaortic adenopathy increased from prior, short axis lymph node measuring 1 cm. -Last PSA increased to 15.8 on 09/28/2019. -As there is rapid progression of disease, I have recommended docetaxel chemotherapy.  He is agreeable after long discussion. -Cystoscopy with transurethral resection of tumor in the posterior bladder neck, right and left walls of the bladder, right lobe of the prostate on 11/10/2019. -Plan was to do 6 cycles of docetaxel followed by abiraterone and prednisone. -6 cycles of docetaxel from 11/16/2019 through 02/29/2020. -CTAP on 01/06/2020 showed persistent thickening of the urinary bladder wall.  Left hydronephrosis is significantly reduced compared to prior exam.  Left-sided retroperitoneal and left iliac lymph nodes reduced in size.  Interval increase in sclerosis of lesions of the left aspect of L2 vertebral body consistent with treatment response.  No new bone lesions. -Bone scan on 01/06/2020 shows stable L2 metastatic focus. -Bone scan scan images from 06/14/2020 which showed progressive metastatic disease, with increased size and intensity of L2 1 new focus of activity at  L1. 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 -Abiraterone and prednisone from 06/24/2020 through 09/06/2021 with progression. - PSMA PET scan (09/28/2021): Residual/recurrent tumor involving prostate gland and seminal vesicles.  Tracer avid bone metastasis localized L1 and L2 vertebral bodies.  No new sites of bone metastasis disease.  No solid organ or nodal mets. - Darolutamide from 10/17/2021 through 03/15/2022 with progression - Guardant360: MSI high not detected.  Androgen resistance present. - XRT to L2-L4, 30 Gray in 10 fractions completed on 04/23/2022 with improvement in pain. - Cabazitaxel cycle 1 on 05/07/2022.   2.  Left hydroureteronephrosis: -CT scan showed prostate mass invading the posterior bladder and obstructing the ureter.  Creatinine increased to 1.1. -Renal ultrasound on 12/11/2019 showed interval resolution of previously seen left-sided hydronephrosis.  Previously seen soft tissue mass at the posterior bladder lumen no longer seen.  Plan: 1.  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 - He had some nausea, decreased appetite lasting for 1 and half week.  He vomited few times.  Compazine helped.  Nausea started 48 hours after treatment. - Reviewed labs today which showed normal LFTs.  CBC was grossly normal. - PSA improved to 44 from 53 on 05/07/2022. - Proceed with cycle 2 today.  I will add Emend to the premeds.  RTC 3 weeks for follow-up with repeat PSA.     2.  Hot flashes: - Continue estradiol patch 0.1 mg twice weekly for hot flashes.   3.  Peripheral neuropathy: - Left foot tingling is stable.   4.  Lower back pain/bilateral hip pains: - Continue tramadol 100 mg half tablet at bedtime.  5.  Osteoporosis (DEXA  scan 02/24/2021 T-score -2.5): - We have previously discussed role of denosumab/zoledronic acid.  He has poor dentition and hence decided against it. - Continue calcium and vitamin D supplements.  No  orders of the defined types were placed in this encounter.    I,Alexis Herring,acting as a Education administrator for [employer], MD.,have documented all relevant documentation on the behalf of [name], MD,as directed by  [name], MD while in the presence of [name], MD.  I, [name] MD, have reviewed the above documentation for accuracy and completeness, and I agree with the above.    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   Prior Therapy: 1. TURP on 11/10/2019. 2. Docetaxel x 6 cycles from 11/16/2019 to 02/29/2020. 3.  Abiraterone and prednisone 4.  Darolutamide  Current Therapy: Cabazitaxel every 21 days   HISTORY OF PRESENT ILLNESS:   Oncology History  Prostate cancer metastatic to multiple sites [facility])  01/25/2017 Initial Diagnosis   Prostate cancer metastatic to multiple sites [facility])   11/16/2019 - 03/02/2020 Chemotherapy   Patient is on Treatment Plan : PROSTATE Docetaxel + Prednisone q21d      Genetic Testing   Negative genetic testing. No pathogenic variants identified on the [facility] CancerNext-Expanded panel. 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 The report date is 04/19/2020.  The CancerNext-Expanded  gene panel offered by [facility] and includes sequencing and rearrangement analysis for the following 77 genes: IP, ALK, APC*, ATM*, AXIN2, BAP1, BARD1, BLM, BMPR1A, BRCA1*, BRCA2*, BRIP1*, CDC73, CDH1*,CDK4, CDKN1B, CDKN2A, CHEK2*, CTNNA1, DICER1, FANCC, FH, FLCN, GALNT12, KIF1B, LZTR1, MAX, MEN1, MET, MLH1*, MSH2*, MSH3, MSH6*, MUTYH*, NBN, NF1*, NF2, NTHL1, PALB2*, PHOX2B, PMS2*, POT1, PRKAR1A, PTCH1, PTEN*, RAD51C*, RAD51D*,RB1, RECQL, RET, SDHA, SDHAF2, SDHB, SDHC, SDHD, SMAD4, SMARCA4, SMARCB1, SMARCE1, STK11, SUFU, TMEM127, TP53*,TSC1, TSC2, VHL and XRCC2  (sequencing and deletion/duplication); EGFR, EGLN1, HOXB13, KIT, MITF, PDGFRA, POLD1 and POLE (sequencing only); EPCAM and GREM1 (deletion/duplication only).    05/07/2022 -  Chemotherapy   Patient is on Treatment Plan : PROSTATE Cabazitaxel (20) D1 + Prednisone D1-21 q21d        INTERVAL HISTORY:   Scott [name] is a 67 y.o. 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 He was last seen by me on 05/07/22.  Today, he states that he is doing well overall. His appetite level is at 75%. His energy level is at 60%.  PAST MEDICAL HISTORY:   Past Medical History: Past Medical History:  Diagnosis Date   Bladder obstruction    Complication of anesthesia    Diverticulitis 2015   Dyspnea    Essential hypertension, benign 01/26/2019   at [place] office elevated, normal at home, no medications at this time   History of basal cell cancer    HLD (hyperlipidemia) 01/26/2019   Hypothyroidism    history of, not currently taking medication   PONV (postoperative nausea and vomiting)    Prostate cancer ([place])    metastatic to bones   Vitamin D deficiency disease 01/26/2019    Surgical History: Past Surgical History:  Procedure Laterality Date   CYSTOSCOPY W/ URETERAL STENT PLACEMENT Left 11/10/2019   Procedure: CYSTOSCOPY;  Surgeon: Irine Seal, MD;  Location: [facility];  Service: Urology;  Laterality: Left;   ESOPHAGOGASTRODUODENOSCOPY (EGD) WITH PROPOFOL N/A 07/07/2020  Procedure: ESOPHAGOGASTRODUODENOSCOPY (EGD) WITH PROPOFOL;  Surgeon: Daneil Dolin, MD;  Location: [facility];  Service: Endoscopy;  Laterality: N/A;  pm appt   HERNIA REPAIR  [id number]   UMBILICAL    IR FLUORO GUIDED NEEDLE PLC ASPIRATION/INJECTION LOC  04/09/2022   MALONEY DILATION N/A 07/07/2020   Procedure: MALONEY DILATION;  Surgeon: Daneil Dolin, MD;  Location: [facility];  Service: Endoscopy;  Laterality: N/A;   PROSTATE SURGERY     SKIN CANCER EXCISION      TONSILLECTOMY Bilateral    TOOTH EXTRACTION     front left tooth pulled but no surgery or stitches   TRANSURETHRAL RESECTION OF PROSTATE N/A 01/25/2017   Procedure: TRANSURETHRAL RESECTION OF THE PROSTATE (TURP);  Surgeon: Alexis Frock, MD;  Location: [facility];  Service: Urology;  Laterality: N/A;   TRANSURETHRAL RESECTION OF PROSTATE N/A 11/10/2019   Procedure: TRANSURETHRAL RESECTION OF THE PROSTATE (TURP);  Surgeon: Irine Seal, MD;  Location: [facility];  Service: Urology;  Laterality: N/A;   WISDOM TOOTH EXTRACTION      Social History: Social History   Socioeconomic History   Marital status: Married    Spouse name: Not on file   Number of children: 1   Years of education: Not on file   Highest education level: Not on file  Occupational History    Comment: working full time  Tobacco Use   Smoking status: Former    Packs/day: 1.50    Years: 35.00    Total pack years: 52.50    Types: Cigarettes    Quit date: 01/12/2004    Years since quitting: 18.3   Smokeless tobacco: Never   Tobacco comments:    OVER 30 YEARS HX OF SMOKING   Vaping Use   Vaping Use: Never used  Substance and Sexual Activity   Alcohol use: Not Currently    Comment: heavy drinker until 2004   Drug use: Not Currently    Types: Marijuana   Sexual activity: Not Currently  Other Topics Concern   Not on file  Social History Narrative   Married for 19 years.Works for [employer] from home.   Social Determinants of Health   Financial Resource Strain: Low Risk  (03/02/2020)   Overall Financial Resource Strain (CARDIA)    Difficulty of Paying Living Expenses: Not hard at all  Food Insecurity: No Food Insecurity (03/02/2020)   Hunger Vital Sign    Worried About Running Out of Food in the Last Year: Never true    Ran Out of Food in the Last Year: Never true  Transportation Needs: No Transportation Needs (03/02/2020)   PRAPARE - Hydrologist (Medical): No    Lack of  Transportation (Non-Medical): No  Physical Activity: Inactive (03/02/2020)   Exercise Vital Sign    Days of Exercise per Week: 0 days    Minutes of Exercise per Session: 0 min  Stress: No Stress Concern Present (03/02/2020)   [place]    Feeling of Stress : Only a little  Social Connections: Moderately Integrated (03/02/2020)   Social Connection and Isolation Panel [NHANES]    Frequency of Communication with Friends and Family: Three times a week    Frequency of Social Gatherings with Friends and Family: Never    Attends Religious Services: Never    Marine scientist or Organizations: Yes    Attends Archivist Meetings: 1 to 4 times per year  Marital Status: Married  Human resources officer Violence: Not At Risk (03/02/2020)   Humiliation, Afraid, Rape, and Kick questionnaire    Fear of Current or Ex-Partner: No    Emotionally Abused: No    Physically Abused: No    Sexually Abused: No    Family History: Family History  Problem Relation Age of Onset   Emphysema Mother    Alzheimer's disease Father    Prostate cancer Father        dx in his 80s   Prostate cancer Brother        dx in his 85s   Breast cancer Maternal Grandmother        60s   Pancreatic cancer Maternal Grandfather        47s   Dementia Paternal Grandmother    Heart attack Paternal Grandfather    Colon cancer Neg Hx     Current Medications:  Current Outpatient Medications:    arginine 500 MG tablet, Take 500 mg by mouth 2 (two) times daily., Disp: , Rfl:    Cholecalciferol 125 MCG (5000 UT) capsule, Take 5,000-10,000 Units by mouth See admin instructions. Take 10000 units in the morning and 5000 units at night, Disp: , Rfl:    DANDELION ROOT PO, Take 500 mg by mouth daily., Disp: , Rfl:    dutasteride (AVODART) 0.5 MG capsule, Take 1 capsule (0.5 mg total) by mouth daily., Disp: 90 capsule, Rfl: 3   estradiol (VIVELLE-DOT) 0.1  MG/24HR patch, APPLY 1 PATCH(0.1 MG) TOPICALLY TO THE SKIN 2 TIMES A WEEK, Disp: 8 patch, Rfl: 12   ibuprofen (ADVIL) 200 MG tablet, Take 400 mg by mouth every 6 (six) hours as needed for moderate pain., Disp: , Rfl:    MAGNESIUM PO, Take 1 tablet by mouth at bedtime., Disp: , Rfl:    MELATONIN PO, Take 40 mg by mouth at bedtime., Disp: , Rfl:    MILK THISTLE PO, Take 1 capsule by mouth daily., Disp: , Rfl:    NATTOKINASE PO, Take 2 capsules by mouth daily., Disp: , Rfl:    NP THYROID 120 MG tablet, Take 0.5 tablets (60 mg total) by mouth every morning., Disp: 45 tablet, Rfl: 1   olmesartan (BENICAR) 20 MG tablet, TAKE 1 TABLET(20 MG) BY MOUTH DAILY, Disp: 90 tablet, Rfl: 1   ondansetron (ZOFRAN ODT) 8 MG disintegrating tablet, Take 1 tablet (8 mg total) by mouth every 8 (eight) hours as needed for nausea or vomiting., Disp: 60 tablet, Rfl: 3   predniSONE (DELTASONE) 10 MG tablet, Take 1 tablet (10 mg total) by mouth daily., Disp: 30 tablet, Rfl: 3   predniSONE (DELTASONE) 5 MG tablet, Take 1 tablet (5 mg total) by mouth daily with breakfast., Disp: 30 tablet, Rfl: 5   prochlorperazine (COMPAZINE) 10 MG tablet, Take 1 tablet (10 mg total) by mouth every 6 (six) hours as needed for nausea or vomiting., Disp: 30 tablet, Rfl: 3   prochlorperazine (COMPAZINE) 10 MG tablet, Take 1 tablet (10 mg total) by mouth every 6 (six) hours as needed for nausea or vomiting., Disp: 60 tablet, Rfl: 1   QUERCETIN PO, Take 1 tablet by mouth 2 (two) times daily., Disp: , Rfl:    thiamine (VITAMIN B-1) 50 MG tablet, Take 1 tablet (50 mg total) by mouth daily., Disp: 90 tablet, Rfl: 1   traMADol HCl 100 MG TABS, TAKE 1 TABLET BY MOUTH AT BEDTIME AS NEEDED, Disp: 60 tablet, Rfl: 0   VITAMIN K PO, Take 1 capsule  by mouth every other day., Disp: , Rfl:    Zinc 30 MG CAPS, Take 30 mg by mouth daily., Disp: , Rfl:    Allergies: Allergies  Allergen Reactions   Crestor [Rosuvastatin] Other (See Comments)    myalgia   Ivp  Dye [Iodinated Contrast Media] Swelling    Swelling on head, took Benadryl and swelling reduced   Wellbutrin [Bupropion] Hives    REVIEW OF SYSTEMS:   Review of Systems  Constitutional:  Negative for chills, fatigue and fever.  HENT:   Negative for lump/mass, mouth sores, nosebleeds, sore throat and trouble swallowing.   Eyes:  Negative for eye problems.  Respiratory:  Positive for shortness of breath. Negative for cough.   Cardiovascular:  Negative for chest pain, leg swelling and palpitations.  Gastrointestinal:  Positive for nausea and vomiting. Negative for abdominal pain, constipation and diarrhea.  Genitourinary:  Negative for bladder incontinence, difficulty urinating, dysuria, frequency, hematuria and nocturia.   Musculoskeletal:  Negative for arthralgias, back pain, flank pain, myalgias and neck pain.  Skin:  Negative for itching and rash.  Neurological:  Positive for headaches. Negative for dizziness and numbness.  Hematological:  Does not bruise/bleed easily.  Psychiatric/Behavioral:  Negative for depression, sleep disturbance and suicidal ideas. The patient is not nervous/anxious.   All other systems reviewed and are negative.    VITALS:   Blood pressure 112/76, pulse (!) 57, temperature 98.1 F (36.7 C), temperature source Oral, resp. rate 18, height '5\' 6"'$  (1.676 m), weight 172 lb 4.8 oz (78.2 kg), SpO2 99 %.  Wt Readings from Last 3 Encounters:  05/30/22 172 lb 4.8 oz (78.2 kg)  05/07/22 173 lb 1.6 oz (78.5 kg)  04/09/22 173 lb (78.5 kg)    Body mass index is 27.81 kg/m.  Performance status (ECOG): 1 - Symptomatic but completely ambulatory  PHYSICAL EXAM:   Physical Exam Vitals and nursing note reviewed. Exam conducted with a chaperone present.  Constitutional:      Appearance: Normal appearance.  Cardiovascular:     Rate and Rhythm: Normal rate and regular rhythm.     Pulses: Normal pulses.     Heart sounds: Normal heart sounds.  Pulmonary:     Effort:  Pulmonary effort is normal.     Breath sounds: Normal breath sounds.  Abdominal:     Palpations: Abdomen is soft. There is no hepatomegaly, splenomegaly or mass.     Tenderness: There is no abdominal tenderness.  Musculoskeletal:     Right lower leg: No edema.     Left lower leg: No edema.  Lymphadenopathy:     Cervical: No cervical adenopathy.     Right cervical: No superficial, deep or posterior cervical adenopathy.    Left cervical: No superficial, deep or posterior cervical adenopathy.     Upper Body:     Right upper body: No supraclavicular or axillary adenopathy.     Left upper body: No supraclavicular or axillary adenopathy.  Neurological:     General: No focal deficit present.     Mental Status: He is alert and oriented to person, place, and time.  Psychiatric:        Mood and Affect: Mood normal.        Behavior: Behavior normal.     LABS:      Latest Ref Rng & Units 05/30/2022    9:30 AM 05/07/2022    8:51 AM 04/09/2022   11:47 AM  CBC  WBC 4.0 - 10.5 K/uL 5.3  5.7  7.7   Hemoglobin 13.0 - 17.0 g/dL 11.8  11.8  12.4   Hematocrit 39.0 - 52.0 % 33.7  33.0  34.6   Platelets 150 - 400 K/uL 307  184  227       Latest Ref Rng & Units 05/30/2022    9:30 AM 05/07/2022    8:51 AM 03/08/2022    2:00 PM  CMP  Glucose 70 - 99 mg/dL 100  105  103   BUN 8 - 23 mg/dL '19  14  16   '$ Creatinine 0.61 - 1.24 mg/dL 0.84  0.83  0.93   Sodium 135 - 145 mmol/L 135  133  131   Potassium 3.5 - 5.1 mmol/L 3.9  3.8  4.2   Chloride 98 - 111 mmol/L 103  103  101   CO2 22 - 32 mmol/L '23  23  22   '$ Calcium 8.9 - 10.3 mg/dL 9.1  8.9  9.1   Total Protein 6.5 - 8.1 g/dL 6.6  6.4  6.8   Total Bilirubin 0.3 - 1.2 mg/dL 0.7  0.8  1.5   Alkaline Phos 38 - 126 U/L 74  76  73   AST 15 - 41 U/L '18  18  16   '$ ALT 0 - 44 U/L '18  12  14      '$ No results found for: "CEA1", "CEA" / No results found for: "CEA1", "CEA" Lab Results  Component Value Date   PSA1 12.9 (H) 05/26/2020   No results found  for: "[id number]" No results found for: "CAN125"  No results found for: "TOTALPROTELP", "ALBUMINELP", "A1GS", "A2GS", "BETS", "BETA2SER", "GAMS", "MSPIKE", "SPEI" Lab Results  Component Value Date   TIBC 336.0 01/09/2021   TIBC 280 04/20/2020   FERRITIN 69.8 01/09/2021   FERRITIN 45 04/20/2020   IRONPCTSAT 45.2 01/09/2021   IRONPCTSAT 31 04/20/2020   No results found for: "LDH"   STUDIES:   No results found.

## 2022-05-30 NOTE — Patient Instructions (Addendum)
[place] at [facility] Discharge Instructions   You were seen and examined today by Dr. Delton Coombes.  He reviewed the results of your lab work which are normal/stable.   We will proceed with your treatment today. Dr. Raliegh Ip is adding a medication called Emend to your treatment plan to help prevent the nausea the chemotherapy is causing you.  Continue prednisone as prescribed.   Return as scheduled.    Thank you for choosing [place] at [facility] to provide your oncology and hematology care.  To afford each patient quality time with our provider, please arrive at least 15 minutes before your scheduled appointment time.   If you have a lab appointment with the [facility] please come in thru the Main Entrance and check in at the main information desk.  You need to re-schedule your appointment should you arrive 10 or more minutes late.  We strive to give you quality time with our providers, and arriving late affects you and other patients whose appointments are after yours.  Also, if you no show three or more times for appointments you may be dismissed from the clinic at the providers discretion.     Again, thank you for choosing [facility].  Our hope is that these requests will decrease the amount of time that you wait before being seen by our physicians.       _____________________________________________________________  Should you have questions after your visit to [facility], please contact our office at [phone] and follow the prompts.  Our office hours are 8:00 a.m. and 4:30 p.m. Monday - Friday.  Please note that voicemails left after 4:00 p.m. may not be returned until the following business day.  We are closed weekends and major holidays.  You do have access to a nurse 24-7, just call the main number to the clinic [phone] and do not press any options, hold on the line and a nurse will answer the  phone.    For prescription refill requests, have your pharmacy contact our office and allow 72 hours.    Due to Covid, you will need to wear a mask upon entering the hospital. If you do not have a mask, a mask will be given to you at the Main Entrance upon arrival. For doctor visits, patients may have 1 support person age 66 or older with them. For treatment visits, patients can not have anyone with them due to social distancing guidelines and our immunocompromised population.

## 2022-05-30 NOTE — Patient Instructions (Signed)
[facility]  Discharge Instructions: Thank you for choosing [facility] to provide your oncology and hematology care.  If you have a lab appointment with the [facility], please come in thru the Main Entrance and check in at the main information desk.  Wear comfortable clothing and clothing appropriate for easy access to any Portacath or PICC line.   We strive to give you quality time with your provider. You may need to reschedule your appointment if you arrive late (15 or more minutes).  Arriving late affects you and other patients whose appointments are after yours.  Also, if you miss three or more appointments without notifying the office, you may be dismissed from the clinic at the provider's discretion.      For prescription refill requests, have your pharmacy contact our office and allow 72 hours for refills to be completed.    Today you received the following chemotherapy and/or immunotherapy agents Jevtana. Cabazitaxel Injection What is this medication? CABAZITAXEL (ka BAZ i TAX el) treats prostate cancer. It works by slowing down the growth of cancer cells. This medicine may be used for other purposes; ask your health care provider or pharmacist if you have questions. COMMON BRAND NAME(S): Jevtana What should I tell my care team before I take this medication? They need to know if you have any of these conditions: Kidney problems Liver disease Low white blood cell levels Lung disease Stomach or intestine problems An unusual or allergic reaction to cabazitaxel, polysorbate 80, other medications, foods, dyes, or preservatives Pregnant or trying to get pregnant Breast-feeding How should I use this medication? This medication is injected into a vein. It is given by your care team in a hospital or clinic setting. Talk to your care team about the use of this medication in children. Special care may be needed. Overdosage: If you think you have taken  too much of this medicine contact a poison control center or emergency room at once. NOTE: This medicine is only for you. Do not share this medicine with others. What if I miss a dose? Keep appointments for follow-up doses. It is important not to miss your dose. Call your care team if you are unable to keep an appointment. What may interact with this medication? Certain antibiotics, such as clarithromycin or telithromycin Certain antivirals for HIV or AIDS Certain medications for fungal infections like ketoconazole, itraconazole, and voriconazole Nefazodone This list may not describe all possible interactions. Give your health care provider a list of all the medicines, herbs, non-prescription drugs, or dietary supplements you use. Also tell them if you smoke, drink alcohol, or use illegal drugs. Some items may interact with your medicine. What should I watch for while using this medication? This medication may make you feel generally unwell. This is not uncommon as chemotherapy can affect healthy cells as well as cancer cells. Report any side effects. Continue your course of treatment even though you feel ill unless your care team tells you to stop. You may need blood work while you are taking this medication. This medication may increase your risk of getting an infection. Call your care team for advice if you get a fever, chills, sore throat, or other symptoms of a cold or flu. Do not treat yourself. Try to avoid being around people who are sick. Avoid taking medications that contain aspirin, acetaminophen, ibuprofen, naproxen, or ketoprofen unless instructed by your care team. These medications may hide a fever. Be careful brushing or flossing  your teeth or using a toothpick because you may get an infection or bleed more easily. If you have any dental work done, tell your dentist you are receiving this medication. This medication can cause serious infusion reactions. To reduce the risk, your care  team may give you other medications to take before receiving this one. Be sure to follow the directions from your care team. Use a condom during sex while taking this medication and for 4 months after the last dose. Talk to your care team right away if your partner may be pregnant. This medication can cause serious birth defects. This medication may cause infertility. Talk to your care team if you are concerned about your fertility. What side effects may I notice from receiving this medication? Side effects that you should report to your care team as soon as possible: Allergic reactions--skin rash, itching, hives, swelling of the face, lips, tongue, or throat Diarrhea, nausea, vomiting Dry cough, shortness of breath or trouble breathing Infection--fever, chills, cough, or sore throat Kidney injury--decrease in the amount of urine, swelling of the ankles, hands, or feet Pain, tingling, or numbness in the hands or feet Red or dark brown urine Stomach bleeding--bloody or black, tar-like stools, vomiting blood or brown material that looks like coffee grounds Stomach pain that is severe, does not away, or gets worse Unusual bruising or bleeding Side effects that usually do not require medical attention (report these to your care team if they continue or are bothersome): Loss of appetite Unusual weakness or fatigue This list may not describe all possible side effects. Call your doctor for medical advice about side effects. You may report side effects to FDA at 1-800-FDA-1088. Where should I keep my medication? This medication is given in a hospital or clinic. It will not be stored at home. NOTE: This sheet is a summary. It may not cover all possible information. If you have questions about this medicine, talk to your doctor, pharmacist, or health care provider.  2023 Elsevier/Gold Standard (2008-09-15 00:00:00)       To help prevent nausea and vomiting after your treatment, we encourage you to  take your nausea medication as directed.  BELOW ARE SYMPTOMS THAT SHOULD BE REPORTED IMMEDIATELY: *FEVER GREATER THAN 100.4 F (38 C) OR HIGHER *CHILLS OR SWEATING *NAUSEA AND VOMITING THAT IS NOT CONTROLLED WITH YOUR NAUSEA MEDICATION *UNUSUAL SHORTNESS OF BREATH *UNUSUAL BRUISING OR BLEEDING *URINARY PROBLEMS (pain or burning when urinating, or frequent urination) *BOWEL PROBLEMS (unusual diarrhea, constipation, pain near the anus) TENDERNESS IN MOUTH AND THROAT WITH OR WITHOUT PRESENCE OF ULCERS (sore throat, sores in mouth, or a toothache) UNUSUAL RASH, SWELLING OR PAIN  UNUSUAL VAGINAL DISCHARGE OR ITCHING   Items with * indicate a potential emergency and should be followed up as soon as possible or go to the Emergency Department if any problems should occur.  Please show the CHEMOTHERAPY ALERT CARD or IMMUNOTHERAPY ALERT CARD at check-in to the Emergency Department and triage nurse.  Should you have questions after your visit or need to cancel or reschedule your appointment, please contact [facility] [phone]  and follow the prompts.  Office hours are 8:00 a.m. to 4:30 p.m. Monday - Friday. Please note that voicemails left after 4:00 p.m. may not be returned until the following business day.  We are closed weekends and major holidays. You have access to a nurse at all times for urgent questions. Please call the main number to the clinic [phone] and follow the  prompts.  For any non-urgent questions, you may also contact your provider using MyChart. We now offer e-Visits for anyone 70 and older to request care online for non-urgent symptoms. For details visit [email or web address].   Also download the MyChart app! Go to the app store, search "MyChart", open the app, select [facility], and log in with your MyChart username and password.

## 2022-06-01 ENCOUNTER — Inpatient Hospital Stay: Payer: BC Managed Care – PPO

## 2022-06-01 VITALS — BP 129/66 | HR 56 | Temp 96.3°F | Resp 18

## 2022-06-01 DIAGNOSIS — C61 Malignant neoplasm of prostate: Secondary | ICD-10-CM

## 2022-06-01 DIAGNOSIS — Z5111 Encounter for antineoplastic chemotherapy: Secondary | ICD-10-CM | POA: Diagnosis not present

## 2022-06-01 MED ORDER — PEGFILGRASTIM-CBQV 6 MG/0.6ML ~~LOC~~ SOSY
6.0000 mg | PREFILLED_SYRINGE | Freq: Once | SUBCUTANEOUS | Status: AC
  Administered 2022-06-01: 6 mg via SUBCUTANEOUS
  Filled 2022-06-01: qty 0.6

## 2022-06-01 NOTE — Progress Notes (Signed)
Khader Burgy presents today for Udenyca injection per the provider's orders.  Stable during administration without incident; injection site WNL; see MAR for injection details.  Patient tolerated procedure well and without incident.  No questions or complaints noted at this time.

## 2022-06-01 NOTE — Patient Instructions (Signed)
[facility]  Discharge Instructions: Thank you for choosing [facility] to provide your oncology and hematology care.  If you have a lab appointment with the Cancer Center, please come in thru the Main Entrance and check in at the main information desk.  Wear comfortable clothing and clothing appropriate for easy access to any Portacath or PICC line.   We strive to give you quality time with your provider. You may need to reschedule your appointment if you arrive late (15 or more minutes).  Arriving late affects you and other patients whose appointments are after yours.  Also, if you miss three or more appointments without notifying the office, you may be dismissed from the clinic at the provider's discretion.      For prescription refill requests, have your pharmacy contact our office and allow 72 hours for refills to be completed.    Today you received the following chemotherapy and/or immunotherapy agents Udenyca      To help prevent nausea and vomiting after your treatment, we encourage you to take your nausea medication as directed.  BELOW ARE SYMPTOMS THAT SHOULD BE REPORTED IMMEDIATELY: *FEVER GREATER THAN 100.4 F (38 C) OR HIGHER *CHILLS OR SWEATING *NAUSEA AND VOMITING THAT IS NOT CONTROLLED WITH YOUR NAUSEA MEDICATION *UNUSUAL SHORTNESS OF BREATH *UNUSUAL BRUISING OR BLEEDING *URINARY PROBLEMS (pain or burning when urinating, or frequent urination) *BOWEL PROBLEMS (unusual diarrhea, constipation, pain near the anus) TENDERNESS IN MOUTH AND THROAT WITH OR WITHOUT PRESENCE OF ULCERS (sore throat, sores in mouth, or a toothache) UNUSUAL RASH, SWELLING OR PAIN  UNUSUAL VAGINAL DISCHARGE OR ITCHING   Items with * indicate a potential emergency and should be followed up as soon as possible or go to the Emergency Department if any problems should occur.  Please show the CHEMOTHERAPY ALERT CARD or IMMUNOTHERAPY ALERT CARD at check-in to the Emergency  Department and triage nurse.  Should you have questions after your visit or need to cancel or reschedule your appointment, please contact [facility] [phone]  and follow the prompts.  Office hours are 8:00 a.m. to 4:30 p.m. Monday - Friday. Please note that voicemails left after 4:00 p.m. may not be returned until the following business day.  We are closed weekends and major holidays. You have access to a nurse at all times for urgent questions. Please call the main number to the clinic [phone] and follow the prompts.  For any non-urgent questions, you may also contact your provider using MyChart. We now offer e-Visits for anyone 18 and older to request care online for non-urgent symptoms. For details visit [email or web address].   Also download the MyChart app! Go to the app store, search "MyChart", open the app, select [facility], and log in with your MyChart username and password.   

## 2022-06-14 ENCOUNTER — Ambulatory Visit (INDEPENDENT_AMBULATORY_CARE_PROVIDER_SITE_OTHER): Payer: BC Managed Care – PPO | Admitting: Urology

## 2022-06-14 ENCOUNTER — Encounter: Payer: Self-pay | Admitting: Urology

## 2022-06-14 VITALS — BP 112/70 | HR 70 | Ht 66.0 in | Wt 172.3 lb

## 2022-06-14 DIAGNOSIS — C779 Secondary and unspecified malignant neoplasm of lymph node, unspecified: Secondary | ICD-10-CM

## 2022-06-14 DIAGNOSIS — R351 Nocturia: Secondary | ICD-10-CM | POA: Diagnosis not present

## 2022-06-14 DIAGNOSIS — C7951 Secondary malignant neoplasm of bone: Secondary | ICD-10-CM

## 2022-06-14 DIAGNOSIS — N403 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: Secondary | ICD-10-CM

## 2022-06-14 DIAGNOSIS — C61 Malignant neoplasm of prostate: Secondary | ICD-10-CM

## 2022-06-14 DIAGNOSIS — N179 Acute kidney failure, unspecified: Secondary | ICD-10-CM

## 2022-06-14 LAB — URINALYSIS, ROUTINE W REFLEX MICROSCOPIC
Bilirubin, UA: NEGATIVE
Glucose, UA: NEGATIVE
Ketones, UA: NEGATIVE
Leukocytes,UA: NEGATIVE
Nitrite, UA: NEGATIVE
Specific Gravity, UA: 1.03 (ref 1.005–1.030)
Urobilinogen, Ur: 0.2 mg/dL (ref 0.2–1.0)
pH, UA: 5 (ref 5.0–7.5)

## 2022-06-14 LAB — MICROSCOPIC EXAMINATION: Bacteria, UA: NONE SEEN

## 2022-06-14 MED ORDER — LEUPROLIDE ACETATE (6 MONTH) 45 MG ~~LOC~~ KIT
45.0000 mg | PACK | Freq: Once | SUBCUTANEOUS | Status: AC
  Administered 2022-06-14: 45 mg via SUBCUTANEOUS

## 2022-06-14 NOTE — Progress Notes (Signed)
Eligard SubQ Injection   Due to Prostate Cancer patient is present today for a Eligard Injection.  Medication: Eligard 6 month Dose: 45 mg  Location: left  Lot: [id number] Exp: 04/2023  Patient tolerated well, no complications were noted  Performed by: Levi Aland, CMA

## 2022-06-14 NOTE — Progress Notes (Signed)
Subjective:  06/14/22: Will returns today in f/u.  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  He had radation to spinal mets with the most recent at L4  in 1/24.  His PSA was 53.59 in 2/24 and was 44.4 on 05/30/22. He remains on dutasteride and estradiol patches.  He has discussed pluvicto but hasn't initiated that.  He has had germline and somatic testing with no actionable mutations per his report.   He has some increased LUTS but his IPSS is 6.  He has nocturia x 1-2 and occasional urgency with UUI.  He has had  12/07/21: Will returns today in f/u for leuprolide for his history of CRCP.  His PSA has continued to rise and was 3.58 in 6/23 and is now 9.84.  He last saw Dr. Delton Coombes on 11/01/21 and was switched to Darolutamide from abiaterone on 10/17/21.   He remains on dutasteride and estradiol patches as well. He had a [facility] PET on 09/28/21 and was noted to have tumor of the prostate gland and SV's and mets at L1 and L2.  There was no hydro noted.   He is on calcium and Vit D.  He had some increased voiding symptoms after starting the darolutamide but that has improved.  His IPSS is 7.  He has some low back and hip pain.  He is starting to losing weight after he gained on the prednisone.   He has discussed Pluvicto and is being considered for that.     06/01/21: Will returns today for Lupron.  His PSA is slowly rising and was 1.54 earlier this month.  He has fatigue and SOB. He has no further bone pain.   He has a stable weight. He is voiding well with an IPSS of 5-6 with some nocturia 1-2x.   He has had no hematuria and the UA is clear.  He continues to use estradiol and abiaterone with pred.  His hot flashes are well controlled with the estradiol.   He is on no bone targeting agents and is reluctant to add them because of dental issues.  He has started a calcium supplement.   He saw Dr. Delton Coombes on 05/23/21.   12/01/20: [name] returns today in f/u for Lupron.  He has been dealing with shingles  on the left flank and upper leg for the last 3-4 weeks but that is improving.  His last PSA in 8/22 was down to 0.89.  He started Zytiga in 4/22 and had lumbar SBRT in 5/22.   He has done well with that and his bone pain has improved.  He is voiding well with an IPSS of 2-3.  His Alk phos has normalized.    06/02/20: Power returns in f/u for his history of CRCP.  His PSA has been rising despite 6 rounds of chemo.   He is seeing Dr. Anastasio Champion and is on oral estradiol 1mg  po bid but the hot flashes have returned.   He remains on dutasteride and Lupron and his T is  <3 and PSA is up to 12.9 on 05/26/20 from 4.77 on 04/28/20.  He is voiding well and his Cr was 0.8 on 2/10.  He has back pain that improved with chemo and prednisone but it has recurred.   His bone scan on 01/14/20 demonstrated stable L2 mets.  The CT showed improved left hydro with minimal residual bilateral hydro.  He is due for Lupron.  He is voiding well.  His IPSS is 6.  He has a tooth that needs extraction.     12/04/19: [name] returns today in f/u from a TURP on 11/10/19 with resection of both UO's for malignant obstruction.   He is voiding better but has some frequency and nocturia 2-4x.  He has some left flank pain with voiding and probably has reflux.   His IPSS is 14.   He has had some hematuria but is improving.   He has some bone pain with the fulphila given for bone marrow support.   He is otherwise tolerating chemo after one dose.  His Cr is down to 0.9 from 1.1 prior to the resection.   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 CT with bilateral non-bulky iliac adenopathy and tiny uptake Lt orbit (no spine mets) by bone scan.  His PSA is up to 15.5 from  9.47 at last check and a doubling over the last year. The testosterone remains castrate at <3 on Lupron and dutasteride. He remains on  estradiol 0.1mg  patches for the hot flashes.   He had a telehealth visit with Dr. Tammi Klippel for consideration of radiation therapy to the primary and pelvic nodes from the [place] PET findings on 12/11/18 but decided against that.  He is seeing Dr. Delton Coombes as well but is  reluctant to consider Zytiga because of the prednisone.   He has had no recent hematuria.  He has occasional frequency with urgency and UUI.  He wears a pad. He has a reduced stream.  He has nocturia 3-4x but he is drinking a lot of fluids.   His IPSS is 29-30.   Present Management:  01/2017 begin Lupron androgen deprivation Q23mo (declined reccomended orchiectomy)  04/2017 - PSA 2.86;  07/2017 PSA/T 3.44 / T 3 ==> Lupron 45mg .  10/08/17 PSA 2.1.  02/07/18 PSA 2.19/T 4. added Estradiol 0.1mg  patches twice weekly for the hot flashes.  03/10/18 PSA 1.87/T<3.  05/09/18 PSA 2.09/T10/ Est 32.  07/24/18 PSA 6/T 54  08/08/18 Lupron 45mg .  09/08/18 PSA 4.37  10/22/18 PSA 4.64/T<3.  11/26/18 Bone scan negative/ CT C/A/P negative. 12/11/18 Axumin PET small equivocal periaortic and left ext iliac nodes and right prostate activity.  I have discussed that with him.  11/28/18 PSA 6.46. 02/10/19 PSA 7.31/T <3. 03/19/19 PSA 8.20 04/11/19  Lupron 45mg  given.  04/23/19 PSA 9.96. 06/23/19 PSA 9.47. 09/28/19 PSA 15.56, T <3.  10/09/19 Lupron 45mg  given 11/10/19  Operative cysto / TURP 2018 with inviltrative bladder neck / prostate mass ( left UO purposefully resected).  11/16/19 PSA 20.69.   11/06/19 Mr.Sandner has a mass at the base of the bladder consistent with local invasion from his CRCP and he has left ureteral obstruction with a minor increase in his Cr.  He has seen Dr. Delton Coombes and is scheduled to begin chemotherapy but it was felt that decompression of the left kidney was indicated.   He is have increased voiding difficulty with passage of clots as well.    Results for MAGED, [id number] (MRN [id number]) as of 07/10/2019 08:56  Ref. Range 11/28/2018 10:23  02/10/2019 13:25 03/19/2019 13:49 04/23/2019 13:58 06/23/2019 13:41  Prostatic Specific Antigen Latest Ref Range: 0.00 - 4.00 ng/mL 6.46 (H) 7.31 (H) 8.20 (H) 9.96 (H) 9.47 (H)      IPSS     Row Name 06/14/22 1300  International Prostate Symptom Score   How often have you had the sensation of not emptying your bladder? Not at All     How often have you had to urinate less than every two hours? Less than 1 in 5 times     How often have you found you stopped and started again several times when you urinated? Not at All     How often have you found it difficult to postpone urination? Less than 1 in 5 times     How often have you had a weak urinary stream? Less than half the time     How often have you had to strain to start urination? Not at All     How many times did you typically get up at night to urinate? 2 Times     Total IPSS Score 6       Quality of Life due to urinary symptoms   If you were to spend the rest of your life with your urinary condition just the way it is now how would you feel about that? Mostly Satisfied              ROS:  ROS:  A complete review of systems was performed.  All systems are negative except for pertinent findings as noted.   Review of Systems  Constitutional:  Negative for weight loss.  Respiratory:  Positive for shortness of breath.   Neurological:  Positive for weakness.    Allergies  Allergen Reactions   Crestor [Rosuvastatin] Other (See Comments)    myalgia   Ivp Dye [Iodinated Contrast Media] Swelling    Swelling on head, took Benadryl and swelling reduced   Wellbutrin [Bupropion] Hives    Outpatient Encounter Medications as of 06/14/2022  Medication Sig   arginine 500 MG tablet Take 500 mg by mouth 2 (two) times daily.   Cholecalciferol 125 MCG (5000 UT) capsule Take 5,000-10,000 Units by mouth See admin instructions. Take 10000 units in the morning and 5000 units at night   DANDELION ROOT PO Take 500 mg by mouth daily.    dutasteride (AVODART) 0.5 MG capsule Take 1 capsule (0.5 mg total) by mouth daily.   estradiol (VIVELLE-DOT) 0.1 MG/24HR patch APPLY 1 PATCH(0.1 MG) TOPICALLY TO THE SKIN 2 TIMES A WEEK   ibuprofen (ADVIL) 200 MG tablet Take 400 mg by mouth every 6 (six) hours as needed for moderate pain.   MAGNESIUM PO Take 1 tablet by mouth at bedtime.   MELATONIN PO Take 40 mg by mouth at bedtime.   MILK THISTLE PO Take 1 capsule by mouth daily.   NATTOKINASE PO Take 2 capsules by mouth daily.   NP THYROID 120 MG tablet Take 0.5 tablets (60 mg total) by mouth every morning.   olmesartan (BENICAR) 20 MG tablet TAKE 1 TABLET(20 MG) BY MOUTH DAILY   ondansetron (ZOFRAN ODT) 8 MG disintegrating tablet Take 1 tablet (8 mg total) by mouth every 8 (eight) hours as needed for nausea or vomiting.   predniSONE (DELTASONE) 10 MG tablet Take 1 tablet (10 mg total) by mouth daily.   predniSONE (DELTASONE) 5 MG tablet Take 1 tablet (5 mg total) by mouth daily with breakfast.   prochlorperazine (COMPAZINE) 10 MG tablet Take 1 tablet (10 mg total) by mouth every 6 (six) hours as needed for nausea or vomiting.   prochlorperazine (COMPAZINE) 10 MG tablet Take 1 tablet (10 mg total) by mouth every 6 (six) hours as needed for nausea or vomiting.  QUERCETIN PO Take 1 tablet by mouth 2 (two) times daily.   thiamine (VITAMIN B-1) 50 MG tablet Take 1 tablet (50 mg total) by mouth daily.   traMADol HCl 100 MG TABS TAKE 1 TABLET BY MOUTH AT BEDTIME AS NEEDED   VITAMIN K PO Take 1 capsule by mouth every other day.   Zinc 30 MG CAPS Take 30 mg by mouth daily.   [EXPIRED] leuprolide (6 Month) (ELIGARD) injection 45 mg    No facility-administered encounter medications on file as of 06/14/2022.    Past Medical History:  Diagnosis Date   Bladder obstruction    Complication of anesthesia    Diverticulitis 2015   Dyspnea    Essential hypertension, benign 01/26/2019   at [place] office elevated, normal at home, no medications at this  time   History of basal cell cancer    HLD (hyperlipidemia) 01/26/2019   Hypothyroidism    history of, not currently taking medication   PONV (postoperative nausea and vomiting)    Prostate cancer ([place])    metastatic to bones   Vitamin D deficiency disease 01/26/2019    Past Surgical History:  Procedure Laterality Date   CYSTOSCOPY W/ URETERAL STENT PLACEMENT Left 11/10/2019   Procedure: CYSTOSCOPY;  Surgeon: Irine Seal, MD;  Location: [facility];  Service: Urology;  Laterality: Left;   ESOPHAGOGASTRODUODENOSCOPY (EGD) WITH PROPOFOL N/A 07/07/2020   Procedure: ESOPHAGOGASTRODUODENOSCOPY (EGD) WITH PROPOFOL;  Surgeon: Daneil Dolin, MD;  Location: [facility];  Service: Endoscopy;  Laterality: N/A;  pm appt   HERNIA REPAIR  [id number]   UMBILICAL    IR FLUORO GUIDED NEEDLE PLC ASPIRATION/INJECTION LOC  04/09/2022   MALONEY DILATION N/A 07/07/2020   Procedure: MALONEY DILATION;  Surgeon: Daneil Dolin, MD;  [facility];  Service: Endoscopy;  Laterality: N/A;   PROSTATE SURGERY     SKIN CANCER EXCISION     TONSILLECTOMY Bilateral    TOOTH EXTRACTION     front left tooth pulled but no surgery or stitches   TRANSURETHRAL RESECTION OF PROSTATE N/A 01/25/2017   Procedure: TRANSURETHRAL RESECTION OF THE PROSTATE (TURP);  Surgeon: Alexis Frock, MD;  Location: [facility];  Service: Urology;  Laterality: N/A;   TRANSURETHRAL RESECTION OF PROSTATE N/A 11/10/2019   Procedure: TRANSURETHRAL RESECTION OF THE PROSTATE (TURP);  Surgeon: Irine Seal, MD;  Location: [facility];  Service: Urology;  Laterality: N/A;   WISDOM TOOTH EXTRACTION      Social History   Socioeconomic History   Marital status: Married    Spouse name: Not on file   Number of children: 1   Years of education: Not on file   Highest education level: Not on file  Occupational History    Comment: working full time  Tobacco Use   Smoking status: Former    Packs/day: 1.50    Years: 35.00    Additional pack years: 0.00     Total pack years: 52.50    Types: Cigarettes    Quit date: 01/12/2004    Years since quitting: 18.4   Smokeless tobacco: Never   Tobacco comments:    OVER 30 YEARS HX OF SMOKING   Vaping Use   Vaping Use: Never used  Substance and Sexual Activity   Alcohol use: Not Currently    Comment: heavy drinker until 2004   Drug use: Not Currently    Types: Marijuana   Sexual activity: Not Currently  Other Topics Concern   Not on file  Social History  Narrative   Married for 19 years.Works for [employer] from home.   Social Determinants of Health   Financial Resource Strain: Low Risk  (03/02/2020)   Overall Financial Resource Strain (CARDIA)    Difficulty of Paying Living Expenses: Not hard at all  Food Insecurity: No Food Insecurity (03/02/2020)   Hunger Vital Sign    Worried About Running Out of Food in the Last Year: Never true    Ran Out of Food in the Last Year: Never true  Transportation Needs: No Transportation Needs (03/02/2020)   PRAPARE - Hydrologist (Medical): No    Lack of Transportation (Non-Medical): No  Physical Activity: Inactive (03/02/2020)   Exercise Vital Sign    Days of Exercise per Week: 0 days    Minutes of Exercise per Session: 0 min  Stress: No Stress Concern Present (03/02/2020)   [place]    Feeling of Stress : Only a little  Social Connections: Moderately Integrated (03/02/2020)   Social Connection and Isolation Panel [NHANES]    Frequency of Communication with Friends and Family: Three times a week    Frequency of Social Gatherings with Friends and Family: Never    Attends Religious Services: Never    Marine scientist or Organizations: Yes    Attends Archivist Meetings: 1 to 4 times per year    Marital Status: Married  Human resources officer Violence: Not At Risk (03/02/2020)   Humiliation, Afraid, Rape, and Kick questionnaire    Fear of  Current or Ex-Partner: No    Emotionally Abused: No    Physically Abused: No    Sexually Abused: No    Family History  Problem Relation Age of Onset   Emphysema Mother    Alzheimer's disease Father    Prostate cancer Father        dx in his 31s   Prostate cancer Brother        dx in his 40s   Breast cancer Maternal Grandmother        60s   Pancreatic cancer Maternal Grandfather        16s   Dementia Paternal Grandmother    Heart attack Paternal Grandfather    Colon cancer Neg Hx        Objective: Vitals:   06/14/22 1322  BP: 112/70  Pulse: 70     Physical ExamI  Recent Results (from the past 2160 hour(s))  CBC upon arrival     Status: Abnormal   Collection Time: 04/09/22 11:47 AM  Result Value Ref Range   WBC 7.7 4.0 - 10.5 K/uL   RBC 3.80 (L) 4.22 - 5.81 MIL/uL   Hemoglobin 12.4 (L) 13.0 - 17.0 g/dL   HCT 34.6 (L) 39.0 - 52.0 %   MCV 91.1 80.0 - 100.0 fL   MCH 32.6 26.0 - 34.0 pg   MCHC 35.8 30.0 - 36.0 g/dL   RDW 11.9 11.5 - 15.5 %   Platelets 227 150 - 400 K/uL   nRBC 0.0 0.0 - 0.2 %    Comment: Performed at [facility], [address], [place], [place] [address]  Protime-INR upon arrival     Status: None   Collection Time: 04/09/22 11:47 AM  Result Value Ref Range   Prothrombin Time 13.2 11.4 - 15.2 seconds   INR 1.0 0.8 - 1.2    Comment: (NOTE) INR goal varies based on device and disease  states. Performed at [facility], [place] [address], [place], [place] [address]   Surgical pathology     Status: None   Collection Time: 04/09/22  2:36 PM  Result Value Ref Range   SURGICAL PATHOLOGY      SURGICAL PATHOLOGY CASE: [id number] PATIENT: Scott [name] Surgical Pathology Report     Clinical History: (nt)     FINAL MICROSCOPIC DIAGNOSIS:  A. L4 VERTEBRAL BODY, BONE BIOPSY: Metastatic prostatic adenocarcinoma  COMMENT:  Sections show a core of reactive trabecular bone infiltrated by a tumor composed of irregular angulated  glands and sheets of atypical cells with enlarged round to oval vesicular nuclei with conspicuous nucleoli within a desmoplastic and fibrotic marrow. Five immunohistochemical stains are performed with adequate control to elucidate the histogenesis of this metastatic tumor.  The tumor is positive for prostein and prostate-specific antigen.  The tumor is also positive for cytokeratin 7.  The tumor is negative for cytokeratin 20 and the pulmonary adeno marker TTF-1.  The cytohistomorphology and this immunohistochemical pattern support the above diagnosis.   GROSS DESCRIPTION:  The specimen is received in formalin and consists o f a 2.0 x 1.7 x 0.2 cm aggregate of red-brown clotted blood, and 2 cores of tan bone measuring 0.7 and 0.8 cm in length by 0.3 cm in diameter.  An additional 0.4 cm aggregate of bone fragments is identified.  The specimen is entirely submitted in 2 cassettes. 1 = blood clot 2 = bone, following decalcification with Immunocal.  Craig Staggers 04/10/2022)   Final Diagnosis performed by Tobin Chad, MD.   Electronically signed 04/12/2022 Technical and / or Professional components performed at [facility]. [facility], [place] [address], [place], [place] [address].  Immunohistochemistry Technical component (if applicable) was performed at [facility]. [address] [place], [place], [place] [address].   IMMUNOHISTOCHEMISTRY DISCLAIMER (if applicable): Some of these immunohistochemical stains may have been developed and the performance characteristics determine by [facility]. Some may not have been cleared or approved by the U.S. Food and Drug  Administration. The FDA has determined that such clearance or approval is not necessary. This test is used for clinical purposes. It should not be regarded as investigational or for research. This laboratory is certified under the [place] (CLIA-88) as  qualified to perform high complexity clinical laboratory testing.  The controls stained appropriately.   Rad Sandria Senter Session Summary     Status: None   Collection Time: 04/10/22  3:30 PM  Result Value Ref Range   Course ID [email or web address]    Course Start Date 04/06/2022    Session Number 1    Course First Treatment Date 04/10/2022  3:23 PM    Course Last Treatment Date 04/10/2022  3:26 PM    Course Elapsed Days 0    Reference Point ID Spine_L DP    Reference Point Dosage Given to Date 2.99999999 Gy   Reference Point Session Dosage Given [id number] Gy   Plan ID [email or web address]    Plan Fractions Treated to Date 1    Plan Total Fractions Prescribed 10    Plan Prescribed Dose Per Fraction 3 Gy   Plan Total Prescribed Dose 30.000000 Gy   Plan Primary Reference Point Spine_L DP   Rad Onc Aria Session Summary     Status: None   Collection Time: 04/11/22  1:48 PM  Result Value Ref Range   Course ID [email or web address]    Course Start Date 04/06/2022  Session Number 2    Course First Treatment Date 04/10/2022  3:23 PM    Course Last Treatment Date 04/11/2022  1:47 PM    Course Elapsed Days 1    Reference Point ID Spine_L DP    Reference Point Dosage Given to Date 5.99999998 Gy   Reference Point Session Dosage Given [id number] Gy   Plan ID [email or web address]    Plan Fractions Treated to Date 2    Plan Total Fractions Prescribed 10    Plan Prescribed Dose Per Fraction 3 Gy   Plan Total Prescribed Dose 30.000000 Gy   Plan Primary Reference Point Spine_L DP   Rad Onc Aria Session Summary     Status: None   Collection Time: 04/12/22  4:12 PM  Result Value Ref Range   Course ID [email or web address]    Course Start Date 04/06/2022    Session Number 3    Course First Treatment Date 04/10/2022  3:23 PM    Course Last Treatment Date 04/12/2022  4:10 PM    Course Elapsed Days 2    Reference Point ID Spine_L DP    Reference Point Dosage Given to Date [id number] Gy   Reference Point Session Dosage Given [id number] Gy   Plan ID  [email or web address]    Plan Fractions Treated to Date 3    Plan Total Fractions Prescribed 10    Plan Prescribed Dose Per Fraction 3 Gy   Plan Total Prescribed Dose 30.000000 Gy   Plan Primary Reference Point Spine_L DP   Rad Onc Aria Session Summary     Status: None   Collection Time: 04/13/22  4:08 PM  Result Value Ref Range   Course ID [email or web address]    Course Start Date 04/06/2022    Session Number 4    Course First Treatment Date 04/10/2022  3:23 PM    Course Last Treatment Date 04/13/2022  4:06 PM    Course Elapsed Days 3    Reference Point ID Spine_L DP    Reference Point Dosage Given to Date [id number] Gy   Reference Point Session Dosage Given [id number] Gy   Plan ID [email or web address]    Plan Fractions Treated to Date 4    Plan Total Fractions Prescribed 10    Plan Prescribed Dose Per Fraction 3 Gy   Plan Total Prescribed Dose 30.000000 Gy   Plan Primary Reference Point Spine_L DP   Rad Onc Aria Session Summary     Status: None   Collection Time: 04/16/22  4:01 PM  Result Value Ref Range   Course ID [email or web address]    Course Start Date 04/06/2022    Session Number 5    Course First Treatment Date 04/10/2022  3:23 PM    Course Last Treatment Date 04/16/2022  4:00 PM    Course Elapsed Days 6    Reference Point ID Spine_L DP    Reference Point Dosage Given to Date [id number] Gy   Reference Point Session Dosage Given [id number] Gy   Plan ID [email or web address]    Plan Fractions Treated to Date 5    Plan Total Fractions Prescribed 10    Plan Prescribed Dose Per Fraction 3 Gy   Plan Total Prescribed Dose 30.000000 Gy   Plan Primary Reference Point Spine_L DP   Rad Onc Aria Session Summary     Status: None   Collection Time: 04/17/22  3:44 PM  Result Value Ref Range   Course ID [email or web address]    Course Start Date 04/06/2022    Session Number 6  Course First Treatment Date 04/10/2022  3:23 PM    Course Last Treatment Date 04/17/2022  3:42 PM    Course Elapsed Days 7    Reference Point ID Spine_L DP     Reference Point Dosage Given to Date [id number] Gy   Reference Point Session Dosage Given [id number] Gy   Plan ID [email or web address]    Plan Fractions Treated to Date 6    Plan Total Fractions Prescribed 10    Plan Prescribed Dose Per Fraction 3 Gy   Plan Total Prescribed Dose 30.000000 Gy   Plan Primary Reference Point Spine_L DP   Rad Onc Aria Session Summary     Status: None   Collection Time: 04/18/22 12:02 PM  Result Value Ref Range   Course ID [email or web address]    Course Start Date 04/06/2022    Session Number 7    Course First Treatment Date 04/10/2022  3:23 PM    Course Last Treatment Date 04/18/2022 12:00 PM    Course Elapsed Days 8    Reference Point ID Spine_L DP    Reference Point Dosage Given to Date [id number] Gy   Reference Point Session Dosage Given [id number] Gy   Plan ID [email or web address]    Plan Fractions Treated to Date 7    Plan Total Fractions Prescribed 10    Plan Prescribed Dose Per Fraction 3 Gy   Plan Total Prescribed Dose 30.000000 Gy   Plan Primary Reference Point Spine_L DP   Rad Onc Aria Session Summary     Status: None   Collection Time: 04/19/22  3:00 PM  Result Value Ref Range   Course ID [email or web address]    Course Start Date 04/06/2022    Session Number 8    Course First Treatment Date 04/10/2022  3:23 PM    Course Last Treatment Date 04/19/2022  2:58 PM    Course Elapsed Days 9    Reference Point ID Spine_L DP    Reference Point Dosage Given to Date [id number] Gy   Reference Point Session Dosage Given [id number] Gy   Plan ID [email or web address]    Plan Fractions Treated to Date 8    Plan Total Fractions Prescribed 10    Plan Prescribed Dose Per Fraction 3 Gy   Plan Total Prescribed Dose 30.000000 Gy   Plan Primary Reference Point Spine_L DP   Rad Onc Aria Session Summary     Status: None   Collection Time: 04/20/22  3:59 PM  Result Value Ref Range   Course ID [email or web address]    Course Start Date 04/06/2022    Session Number 9    Course First Treatment Date 04/10/2022  3:23 PM     Course Last Treatment Date 04/20/2022  3:58 PM    Course Elapsed Days 10    Reference Point ID Spine_L DP    Reference Point Dosage Given to Date [id number] Gy   Reference Point Session Dosage Given [id number] Gy   Plan ID [email or web address]    Plan Fractions Treated to Date 9    Plan Total Fractions Prescribed 10    Plan Prescribed Dose Per Fraction 3 Gy   Plan Total Prescribed Dose 30.000000 Gy   Plan Primary Reference Point Spine_L DP   Rad Onc Aria Session Summary     Status: None   Collection Time: 04/23/22  1:51 PM  Result Value Ref Range   Course ID [email or web address]    Course Start Date 04/06/2022    Session Number 10    Course First Treatment Date  04/10/2022  3:23 PM    Course Last Treatment Date 04/23/2022  1:49 PM    Course Elapsed Days 13    Reference Point ID Spine_L DP    Reference Point Dosage Given to Date 29.9999999 Gy   Reference Point Session Dosage Given [id number] Gy   Plan ID [email or web address]    Plan Fractions Treated to Date 10    Plan Total Fractions Prescribed 10    Plan Prescribed Dose Per Fraction 3 Gy   Plan Total Prescribed Dose 30.000000 Gy   Plan Primary Reference Point Spine_L DP   PSA     Status: Abnormal   Collection Time: 05/07/22  8:51 AM  Result Value Ref Range   Prostatic Specific Antigen 53.59 (H) 0.00 - 4.00 ng/mL    Comment: (NOTE) While PSA levels of <=4.00 ng/ml are reported as reference range, some men with levels below 4.00 ng/ml can have prostate cancer and many men with PSA above 4.00 ng/ml do not have prostate cancer.  Other tests such as free PSA, age specific reference ranges, PSA velocity and PSA doubling time may be helpful especially in men less than 22 years old. Performed at [facility], [place] [address] [place], [place] [address]   Magnesium     Status: None   Collection Time: 05/07/22  8:51 AM  Result Value Ref Range   Magnesium 2.1 1.7 - 2.4 mg/dL    Comment: Performed at [facility], [address] [place], [place] [address]   Comprehensive metabolic panel     Status: Abnormal   Collection Time: 05/07/22  8:51 AM  Result Value Ref Range   Sodium 133 (L) 135 - 145 mmol/L   Potassium 3.8 3.5 - 5.1 mmol/L   Chloride 103 98 - 111 mmol/L   CO2 23 22 - 32 mmol/L   Glucose, Bld 105 (H) 70 - 99 mg/dL    Comment: Glucose reference range applies only to samples taken after fasting for at least 8 hours.   BUN 14 8 - 23 mg/dL   Creatinine, Ser 0.83 0.61 - 1.24 mg/dL   Calcium 8.9 8.9 - 10.3 mg/dL   Total Protein 6.4 (L) 6.5 - 8.1 g/dL   Albumin 3.6 3.5 - 5.0 g/dL   AST 18 15 - 41 U/L   ALT 12 0 - 44 U/L   Alkaline Phosphatase 76 38 - 126 U/L   Total Bilirubin 0.8 0.3 - 1.2 mg/dL   GFR, Estimated >60 >60 mL/min    Comment: (NOTE) Calculated using the CKD-EPI Creatinine Equation (2021)    Anion gap 7 5 - 15    Comment: Performed at [facility], [address], [place], [place] [address]  CBC with Differential     Status: Abnormal   Collection Time: 05/07/22  8:51 AM  Result Value Ref Range   WBC 5.7 4.0 - 10.5 K/uL   RBC 3.66 (L) 4.22 - 5.81 MIL/uL   Hemoglobin 11.8 (L) 13.0 - 17.0 g/dL   HCT 33.0 (L) 39.0 - 52.0 %   MCV 90.2 80.0 - 100.0 fL   MCH 32.2 26.0 - 34.0 pg   MCHC 35.8 30.0 - 36.0 g/dL   RDW 11.9 11.5 - 15.5 %   Platelets 184 150 - 400 K/uL   nRBC 0.0 0.0 - 0.2 %   Neutrophils Relative % 65 %   Neutro Abs 3.8 1.7 - 7.7 K/uL   Lymphocytes Relative 19 %   Lymphs Abs 1.1 0.7 - 4.0 K/uL  Monocytes Relative 11 %   Monocytes Absolute 0.6 0.1 - 1.0 K/uL   Eosinophils Relative 3 %   Eosinophils Absolute 0.1 0.0 - 0.5 K/uL   Basophils Relative 1 %   Basophils Absolute 0.1 0.0 - 0.1 K/uL   Immature Granulocytes 1 %   Abs Immature Granulocytes 0.03 0.00 - 0.07 K/uL    Comment: Performed at [facility], [address], [place], [place] [address]  CBC with Differential     Status: Abnormal   Collection Time: 05/30/22  9:30 AM  Result Value Ref Range   WBC 5.3 4.0 - 10.5 K/uL   RBC 3.58 (L) 4.22 -  5.81 MIL/uL   Hemoglobin 11.8 (L) 13.0 - 17.0 g/dL   HCT 33.7 (L) 39.0 - 52.0 %   MCV 94.1 80.0 - 100.0 fL   MCH 33.0 26.0 - 34.0 pg   MCHC 35.0 30.0 - 36.0 g/dL   RDW 13.1 11.5 - 15.5 %   Platelets 307 150 - 400 K/uL   nRBC 0.0 0.0 - 0.2 %   Neutrophils Relative % 58 %   Neutro Abs 3.1 1.7 - 7.7 K/uL   Lymphocytes Relative 25 %   Lymphs Abs 1.3 0.7 - 4.0 K/uL   Monocytes Relative 14 %   Monocytes Absolute 0.7 0.1 - 1.0 K/uL   Eosinophils Relative 1 %   Eosinophils Absolute 0.0 0.0 - 0.5 K/uL   Basophils Relative 2 %   Basophils Absolute 0.1 0.0 - 0.1 K/uL   Immature Granulocytes 0 %   Abs Immature Granulocytes 0.02 0.00 - 0.07 K/uL    Comment: Performed at [facility], [address], [place], [place] [address]  Comprehensive metabolic panel     Status: Abnormal   Collection Time: 05/30/22  9:30 AM  Result Value Ref Range   Sodium 135 135 - 145 mmol/L   Potassium 3.9 3.5 - 5.1 mmol/L   Chloride 103 98 - 111 mmol/L   CO2 23 22 - 32 mmol/L   Glucose, Bld 100 (H) 70 - 99 mg/dL    Comment: Glucose reference range applies only to samples taken after fasting for at least 8 hours.   BUN 19 8 - 23 mg/dL   Creatinine, Ser 0.84 0.61 - 1.24 mg/dL   Calcium 9.1 8.9 - 10.3 mg/dL   Total Protein 6.6 6.5 - 8.1 g/dL   Albumin 3.8 3.5 - 5.0 g/dL   AST 18 15 - 41 U/L   ALT 18 0 - 44 U/L   Alkaline Phosphatase 74 38 - 126 U/L   Total Bilirubin 0.7 0.3 - 1.2 mg/dL   GFR, Estimated >60 >60 mL/min    Comment: (NOTE) Calculated using the CKD-EPI Creatinine Equation (2021)    Anion gap 9 5 - 15    Comment: Performed at [facility], [address], [place], [place] [address]  Magnesium     Status: None   Collection Time: 05/30/22  9:30 AM  Result Value Ref Range   Magnesium 2.2 1.7 - 2.4 mg/dL    Comment: Performed at [facility], [address], [place], [place] [address]  PSA     Status: Abnormal   Collection Time: 05/30/22  9:30 AM  Result Value Ref Range   Prostatic Specific Antigen  44.40 (H) 0.00 - 4.00 ng/mL    Comment: (NOTE) While PSA levels of <=4.00 ng/ml are reported as reference range, some men with levels below 4.00 ng/ml can have prostate cancer and many men with PSA  above 4.00 ng/ml do not have prostate cancer.  Other tests such as free PSA, age specific reference ranges, PSA velocity and PSA doubling time may be helpful especially in men less than 80 years old. Performed at [facility], [place] [address], [place], [place] [address]   Urinalysis, Routine w reflex microscopic     Status: Abnormal   Collection Time: 06/14/22  1:32 PM  Result Value Ref Range   Specific Gravity, UA 1.030 1.005 - 1.030   pH, UA 5.0 5.0 - 7.5   Color, UA Yellow Yellow   Appearance Ur Clear Clear   Leukocytes,UA Negative Negative   Protein,UA 1+ (A) Negative/Trace   Glucose, UA Negative Negative   Ketones, UA Negative Negative   RBC, UA Trace (A) Negative   Bilirubin, UA Negative Negative   Urobilinogen, Ur 0.2 0.2 - 1.0 mg/dL   Nitrite, UA Negative Negative   Microscopic Examination See below:     Comment: Microscopic was indicated and was performed.  Microscopic Examination     Status: None   Collection Time: 06/14/22  1:32 PM   Urine  Result Value Ref Range   WBC, UA 0-5 0 - 5 /hpf   RBC, Urine 0-2 0 - 2 /hpf   Epithelial Cells (non renal) 0-10 0 - 10 /hpf   Bacteria, UA None seen None seen/Few   I reviewed the records from his visits with Dr. Delton Coombes and his CT report.   Assessment & Plan: He has CRCP with locally advanced disease with a history of  left ureteral obstruction with AKI and he also has bone and lymph node metastases.   He has progressive disease and is now on cabazitaxol.  He has had further SBRT for lumbar mets..  He was given leuprolide today.    He is on the estradiol patch which is helping his ADT side effects.   Nodular prostate with obstruction.  He continues to do well following the TURP.     History of Left ureteral obstruction.    He has no left flank pain.  Recent CT shows no obstruction.   Continue Luprolide 45mg  q67mo and dutasteride.   The estradiol is managed by Dr. Delton Coombes    Meds ordered this encounter  Medications   leuprolide (6 Month) (ELIGARD) injection 45 mg     Orders Placed This Encounter  Procedures   Microscopic Examination   Urinalysis, Routine w reflex microscopic       Return in about 6 months (around 12/15/2022) for for Eligard.   CC: Janith Lima, MD, Dr. [name].     Irine Seal 06/15/2022 Patient ID: Scott [name], male   DOB: 1955-06-03, 67 y.o.   MRN: [id number]

## 2022-06-19 ENCOUNTER — Other Ambulatory Visit: Payer: Self-pay

## 2022-06-19 NOTE — Progress Notes (Signed)
Scott [address] [place] [address]    Clinic Day:  06/20/2022  Referring physician: Janith Lima, MD  Patient Care Team: Scott Lima, MD as PCP - General (Internal Medicine) Scott Jack, MD as Consulting Physician (Hematology and Oncology) Scott Romney Cristopher Estimable, MD as Consulting Physician (Gastroenterology) Scott Mates, RN as Oncology Nurse Navigator (Medical Oncology)   ASSESSMENT & PLAN:   Assessment: 1.  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  Prominent left hydronephrosis and hydroureter noted.  New sclerotic lesion in the left anterior vertebral body of L1.  Mild left periaortic adenopathy increased from prior, short axis lymph node measuring 1 cm. -Last PSA increased to 15.8 on 09/28/2019. -As there is rapid progression of disease, I have recommended docetaxel chemotherapy.  He is agreeable after long discussion. -Cystoscopy with transurethral resection of tumor in the posterior bladder neck, right and left walls of the bladder, right lobe of the prostate on 11/10/2019. -Plan was to do 6 cycles of docetaxel followed by abiraterone and prednisone. -6 cycles of docetaxel from 11/16/2019 through 02/29/2020. -CTAP on 01/06/2020 showed persistent thickening of the urinary bladder wall.  Left hydronephrosis is significantly reduced compared to prior exam.  Left-sided retroperitoneal and left iliac lymph nodes reduced in size.  Interval increase in sclerosis of lesions of the left aspect of L2 vertebral body consistent with treatment response.  No new bone lesions. -Bone scan on 01/06/2020 shows stable L2 metastatic focus. -Bone scan scan images from 06/14/2020 which showed progressive metastatic disease, with increased size and intensity of L2 1 new focus of activity at  L1. 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 -Abiraterone and prednisone from 06/24/2020 through 09/06/2021 with progression. - PSMA PET scan (09/28/2021): Residual/recurrent tumor involving prostate gland and seminal vesicles.  Tracer avid bone metastasis localized L1 and L2 vertebral bodies.  No new sites of bone metastasis disease.  No solid organ or nodal mets. - Darolutamide from 10/17/2021 through 03/15/2022 with progression - Guardant360: MSI high not detected.  Androgen resistance present. - XRT to L2-L4, 30 Gray in 10 fractions completed on 04/23/2022 with improvement in pain. - Cabazitaxel cycle 1 on 05/07/2022.   2.  Left hydroureteronephrosis: -CT scan showed prostate mass invading the posterior bladder and obstructing the ureter.  Creatinine increased to 1.1. -Renal ultrasound on 12/11/2019 showed interval resolution of previously seen left-sided hydronephrosis.  Previously seen soft tissue mass at the posterior bladder lumen no longer seen.   Plan: 1.  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 - He reported some fatigue.  He also had some pain in the abdomen which he attributes to prior history of taking lots of ibuprofen.  Pain is burning in sensation.  Pepto-Bismol gives some relief.  He is taking cimetidine. - Nausea improved after adding Emend to the premeds. - Labs today: LFTs normal.  Creatinine and electrolytes normal.  CBC was grossly normal.  Last PSA is improved to 44 from 53.59. - Proceed with cycle 3 today.  Will do imaging if the PSA increases.   2.  Hot flashes: - Continue estradiol patch 0.1 mg twice weekly for hot flashes.   3.  Peripheral neuropathy: - Tingling in the left foot is stable.  Will closely  monitor.   4.  Lower back pain/bilateral hip pains: - Continue tramadol 100 mg half tablet at bedtime. We will prescribe 50 mg tablets because of problem with insurance and drugstore. - He  has more pains in the first 4 to 5 days after growth factor injection.  We will send hydrocodone 5/325 to use at that time as needed.  5.  Osteoporosis (DEXA scan 02/24/2021 T-score -2.5): - We have previously discussed role of denosumab/zoledronic acid.  He has poor dentition and opted against it.  Continue calcium and vitamin D supplements.  No orders of the defined types were placed in this encounter.     I,Scott [name],acting as a Education administrator for Scott Jack, MD.,have documented all relevant documentation on the behalf of Scott Jack, MD,as directed by  Scott Jack, MD while in the presence of Scott Jack, MD.   I, Scott Jack MD, have reviewed the above documentation for accuracy and completeness, and I agree with the above.   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   Prior Therapy: 1. TURP on 11/10/2019. 2. Docetaxel x 6 cycles from 11/16/2019 to 02/29/2020. 3.  Abiraterone and prednisone 4.  Darolutamide  Current Therapy:  Cabazitaxel every 21 days    HISTORY OF PRESENT ILLNESS:   Oncology History  Prostate cancer metastatic to multiple sites  01/25/2017 Initial Diagnosis   Prostate cancer metastatic to multiple sites [name])   11/16/2019 - 03/02/2020 Chemotherapy   Patient is on Treatment Plan : PROSTATE Docetaxel + Prednisone q21d      Genetic Testing   Negative genetic testing. No pathogenic variants identified on the [facility] CancerNext-Expanded panel. 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 The report date is 04/19/2020.  The CancerNext-Expanded  gene panel offered by [facility] and includes sequencing and rearrangement analysis for the following 77 genes: IP, ALK, APC*, ATM*, AXIN2, BAP1, BARD1, BLM, BMPR1A, BRCA1*, BRCA2*, BRIP1*, CDC73, CDH1*,CDK4, CDKN1B,  CDKN2A, CHEK2*, CTNNA1, DICER1, FANCC, FH, FLCN, GALNT12, KIF1B, LZTR1, MAX, MEN1, MET, MLH1*, MSH2*, MSH3, MSH6*, MUTYH*, NBN, NF1*, NF2, NTHL1, PALB2*, PHOX2B, PMS2*, POT1, PRKAR1A, PTCH1, PTEN*, RAD51C*, RAD51D*,RB1, RECQL, RET, SDHA, SDHAF2, SDHB, SDHC, SDHD, SMAD4, SMARCA4, SMARCB1, SMARCE1, STK11, SUFU, TMEM127, TP53*,TSC1, TSC2, VHL and XRCC2 (sequencing and deletion/duplication); EGFR, EGLN1, HOXB13, KIT, MITF, PDGFRA, POLD1 and POLE (sequencing only); EPCAM and GREM1 (deletion/duplication only).    05/07/2022 -  Chemotherapy   Patient is on Treatment Plan : PROSTATE Cabazitaxel (20) D1 + Prednisone D1-21 q21d        INTERVAL HISTORY:   Scott [name] is a 67 y.o. 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 He was last seen by me on 05/30/22.  Today, he states that he is doing well overall. His appetite level is at 60%. His energy level is at 40%.  PAST MEDICAL HISTORY:   Past Medical History: Past Medical History:  Diagnosis Date   Bladder obstruction    Complication of anesthesia    Diverticulitis 2015   Dyspnea    Essential hypertension, benign 01/26/2019   at [place] office elevated, normal at home, no medications at this time   History of basal cell cancer    HLD (hyperlipidemia) 01/26/2019   Hypothyroidism    history of, not currently taking medication   PONV (postoperative nausea and vomiting)    Prostate  cancer [name])    metastatic to bones   Vitamin D deficiency disease 01/26/2019    Surgical History: Past Surgical History:  Procedure Laterality Date   CYSTOSCOPY W/ URETERAL STENT PLACEMENT Left 11/10/2019   Procedure: CYSTOSCOPY;  Surgeon: Irine Seal, MD;  Location: [facility];  Service: Urology;  Laterality: Left;   ESOPHAGOGASTRODUODENOSCOPY (EGD) WITH PROPOFOL N/A 07/07/2020   Procedure: ESOPHAGOGASTRODUODENOSCOPY (EGD) WITH PROPOFOL;  Surgeon: Daneil Dolin, MD;  Location: [facility];  Service:  Endoscopy;  Laterality: N/A;  pm appt   HERNIA REPAIR  [id number]   UMBILICAL    IR FLUORO GUIDED NEEDLE PLC ASPIRATION/INJECTION LOC  04/09/2022   MALONEY DILATION N/A 07/07/2020   Procedure: MALONEY DILATION;  Surgeon: Daneil Dolin, MD;  Location: [facility];  Service: Endoscopy;  Laterality: N/A;   PROSTATE SURGERY     SKIN CANCER EXCISION     TONSILLECTOMY Bilateral    TOOTH EXTRACTION     front left tooth pulled but no surgery or stitches   TRANSURETHRAL RESECTION OF PROSTATE N/A 01/25/2017   Procedure: TRANSURETHRAL RESECTION OF THE PROSTATE (TURP);  Surgeon: Alexis Frock, MD;  Location: [facility];  Service: Urology;  Laterality: N/A;   TRANSURETHRAL RESECTION OF PROSTATE N/A 11/10/2019   Procedure: TRANSURETHRAL RESECTION OF THE PROSTATE (TURP);  Surgeon: Irine Seal, MD;  Location: [facility];  Service: Urology;  Laterality: N/A;   WISDOM TOOTH EXTRACTION      Social History: Social History   Socioeconomic History   Marital status: Married    Spouse name: Not on file   Number of children: 1   Years of education: Not on file   Highest education level: Not on file  Occupational History    Comment: working full time  Tobacco Use   Smoking status: Former    Packs/day: 1.50    Years: 35.00    Additional pack years: 0.00    Total pack years: 52.50    Types: Cigarettes    Quit date: 01/12/2004    Years since quitting: 18.4   Smokeless tobacco: Never   Tobacco comments:    OVER 30 YEARS HX OF SMOKING   Vaping Use   Vaping Use: Never used  Substance and Sexual Activity   Alcohol use: Not Currently    Comment: heavy drinker until 2004   Drug use: Not Currently    Types: Marijuana   Sexual activity: Not Currently  Other Topics Concern   Not on file  Social History Narrative   Married for 19 years.Works for [employer] from home.   Social Determinants of Health   Financial Resource Strain: Low Risk  (03/02/2020)   Overall Financial Resource Strain (CARDIA)     Difficulty of Paying Living Expenses: Not hard at all  Food Insecurity: No Food Insecurity (03/02/2020)   Hunger Vital Sign    Worried About Running Out of Food in the Last Year: Never true    Ran Out of Food in the Last Year: Never true  Transportation Needs: No Transportation Needs (03/02/2020)   PRAPARE - Hydrologist (Medical): No    Lack of Transportation (Non-Medical): No  Physical Activity: Inactive (03/02/2020)   Exercise Vital Sign    Days of Exercise per Week: 0 days    Minutes of Exercise per Session: 0 min  Stress: No Stress Concern Present (03/02/2020)   [place]    Feeling of Stress : Only a  little  Social Connections: Moderately Integrated (03/02/2020)   Social Connection and Isolation Panel [NHANES]    Frequency of Communication with Friends and Family: Three times a week    Frequency of Social Gatherings with Friends and Family: Never    Attends Religious Services: Never    Marine scientist or Organizations: Yes    Attends Archivist Meetings: 1 to 4 times per year    Marital Status: Married  Human resources officer Violence: Not At Risk (03/02/2020)   Humiliation, Afraid, Rape, and Kick questionnaire    Fear of Current or Ex-Partner: No    Emotionally Abused: No    Physically Abused: No    Sexually Abused: No    Family History: Family History  Problem Relation Age of Onset   Emphysema Mother    Alzheimer's disease Father    Prostate cancer Father        dx in his 30s   Prostate cancer Brother        dx in his 22s   Breast cancer Maternal Grandmother        60s   Pancreatic cancer Maternal Grandfather        22s   Dementia Paternal Grandmother    Heart attack Paternal Grandfather    Colon cancer Neg Hx     Current Medications:  Current Outpatient Medications:    arginine 500 MG tablet, Take 500 mg by mouth 2 (two) times daily., Disp: , Rfl:     Cholecalciferol 125 MCG (5000 UT) capsule, Take 5,000-10,000 Units by mouth See admin instructions. Take 10000 units in the morning and 5000 units at night, Disp: , Rfl:    DANDELION ROOT PO, Take 500 mg by mouth daily., Disp: , Rfl:    dutasteride (AVODART) 0.5 MG capsule, Take 1 capsule (0.5 mg total) by mouth daily., Disp: 90 capsule, Rfl: 3   estradiol (VIVELLE-DOT) 0.1 MG/24HR patch, APPLY 1 PATCH(0.1 MG) TOPICALLY TO THE SKIN 2 TIMES A WEEK, Disp: 8 patch, Rfl: 12   ibuprofen (ADVIL) 200 MG tablet, Take 400 mg by mouth every 6 (six) hours as needed for moderate pain., Disp: , Rfl:    MAGNESIUM PO, Take 1 tablet by mouth at bedtime., Disp: , Rfl:    MELATONIN PO, Take 40 mg by mouth at bedtime., Disp: , Rfl:    MILK THISTLE PO, Take 1 capsule by mouth daily., Disp: , Rfl:    NATTOKINASE PO, Take 2 capsules by mouth daily., Disp: , Rfl:    NP THYROID 120 MG tablet, Take 0.5 tablets (60 mg total) by mouth every morning., Disp: 45 tablet, Rfl: 1   olmesartan (BENICAR) 20 MG tablet, TAKE 1 TABLET(20 MG) BY MOUTH DAILY, Disp: 90 tablet, Rfl: 1   ondansetron (ZOFRAN ODT) 8 MG disintegrating tablet, Take 1 tablet (8 mg total) by mouth every 8 (eight) hours as needed for nausea or vomiting., Disp: 60 tablet, Rfl: 3   predniSONE (DELTASONE) 10 MG tablet, Take 1 tablet (10 mg total) by mouth daily., Disp: 30 tablet, Rfl: 3   predniSONE (DELTASONE) 5 MG tablet, Take 1 tablet (5 mg total) by mouth daily with breakfast., Disp: 30 tablet, Rfl: 5   prochlorperazine (COMPAZINE) 10 MG tablet, Take 1 tablet (10 mg total) by mouth every 6 (six) hours as needed for nausea or vomiting., Disp: 30 tablet, Rfl: 3   prochlorperazine (COMPAZINE) 10 MG tablet, Take 1 tablet (10 mg total) by mouth every 6 (six) hours as needed for nausea  or vomiting., Disp: 60 tablet, Rfl: 1   pyridOXINE (VITAMIN B6) 25 MG tablet, Take 25 mg by mouth daily., Disp: , Rfl:    QUERCETIN PO, Take 1 tablet by mouth 2 (two) times daily., Disp:  , Rfl:    thiamine (VITAMIN B-1) 50 MG tablet, Take 1 tablet (50 mg total) by mouth daily., Disp: 90 tablet, Rfl: 1   traMADol HCl 100 MG TABS, TAKE 1 TABLET BY MOUTH AT BEDTIME AS NEEDED, Disp: 60 tablet, Rfl: 0   VITAMIN K PO, Take 1 capsule by mouth every other day., Disp: , Rfl:    Zinc 30 MG CAPS, Take 30 mg by mouth daily., Disp: , Rfl:  No current facility-administered medications for this visit.  Facility-Administered Medications Ordered in Other Visits:    heparin lock flush 100 unit/mL, 500 Units, Intravenous, Once, Scott Jack, MD   sodium chloride flush (NS) 0.9 % injection 10 mL, 10 mL, Intravenous, PRN, Scott Jack, MD   Allergies: Allergies  Allergen Reactions   Crestor [Rosuvastatin] Other (See Comments)    myalgia   Ivp Dye [Iodinated Contrast Media] Swelling    Swelling on head, took Benadryl and swelling reduced   Wellbutrin [Bupropion] Hives    REVIEW OF SYSTEMS:   Review of Systems  Constitutional:  Negative for chills, fatigue and fever.  HENT:   Negative for lump/mass, mouth sores, nosebleeds, sore throat and trouble swallowing.   Eyes:  Negative for eye problems.  Respiratory:  Positive for shortness of breath. Negative for cough.   Cardiovascular:  Negative for chest pain, leg swelling and palpitations.  Gastrointestinal:  Positive for constipation and diarrhea. Negative for abdominal pain, nausea and vomiting.  Genitourinary:  Negative for bladder incontinence, difficulty urinating, dysuria, frequency, hematuria and nocturia.   Musculoskeletal:  Negative for arthralgias, back pain, flank pain, myalgias and neck pain.  Skin:  Negative for itching and rash.  Neurological:  Positive for numbness. Negative for dizziness and headaches.  Hematological:  Does not bruise/bleed easily.  Psychiatric/Behavioral:  Negative for depression, sleep disturbance and suicidal ideas. The patient is not nervous/anxious.   All other systems reviewed and are  negative.    VITALS:   Blood pressure 116/79, pulse 61, temperature 98.4 F (36.9 C), temperature source Oral, resp. rate 17, height 5\' 6"  (1.676 m), weight 173 lb 8 oz (78.7 kg), SpO2 97 %.  Wt Readings from Last 3 Encounters:  06/20/22 173 lb 8 oz (78.7 kg)  06/14/22 172 lb 4.8 oz (78.2 kg)  05/30/22 172 lb 4.8 oz (78.2 kg)    Body mass index is 28 kg/m.  Performance status (ECOG): 1 - Symptomatic but completely ambulatory  PHYSICAL EXAM:   Physical Exam Vitals and nursing note reviewed. Exam conducted with a chaperone present.  Constitutional:      Appearance: Normal appearance.  Cardiovascular:     Rate and Rhythm: Normal rate and regular rhythm.     Pulses: Normal pulses.     Heart sounds: Normal heart sounds.  Pulmonary:     Effort: Pulmonary effort is normal.     Breath sounds: Normal breath sounds.  Abdominal:     Palpations: Abdomen is soft. There is no hepatomegaly, splenomegaly or mass.     Tenderness: There is no abdominal tenderness.  Musculoskeletal:     Right lower leg: No edema.     Left lower leg: No edema.  Lymphadenopathy:     Cervical: No cervical adenopathy.     Right cervical: No superficial,  deep or posterior cervical adenopathy.    Left cervical: No superficial, deep or posterior cervical adenopathy.     Upper Body:     Right upper body: No supraclavicular or axillary adenopathy.     Left upper body: No supraclavicular or axillary adenopathy.  Neurological:     General: No focal deficit present.     Mental Status: He is alert and oriented to person, place, and time.  Psychiatric:        Mood and Affect: Mood normal.        Behavior: Behavior normal.     LABS:      Latest Ref Rng & Units 06/20/2022   11:32 AM 05/30/2022    9:30 AM 05/07/2022    8:51 AM  CBC  WBC 4.0 - 10.5 K/uL 7.1  5.3  5.7   Hemoglobin 13.0 - 17.0 g/dL 11.0  11.8  11.8   Hematocrit 39.0 - 52.0 % 32.0  33.7  33.0   Platelets 150 - 400 K/uL 213  307  184        Latest Ref Rng & Units 06/20/2022   11:32 AM 05/30/2022    9:30 AM 05/07/2022    8:51 AM  CMP  Glucose 70 - 99 mg/dL 106  100  105   BUN 8 - 23 mg/dL 17  19  14    Creatinine 0.61 - 1.24 mg/dL 0.78  0.84  0.83   Sodium 135 - 145 mmol/L 133  135  133   Potassium 3.5 - 5.1 mmol/L 4.0  3.9  3.8   Chloride 98 - 111 mmol/L 103  103  103   CO2 22 - 32 mmol/L 23  23  23    Calcium 8.9 - 10.3 mg/dL 9.0  9.1  8.9   Total Protein 6.5 - 8.1 g/dL 6.3  6.6  6.4   Total Bilirubin 0.3 - 1.2 mg/dL 0.7  0.7  0.8   Alkaline Phos 38 - 126 U/L 82  74  76   AST 15 - 41 U/L 15  18  18    ALT 0 - 44 U/L 17  18  12       No results found for: "CEA1", "CEA" / No results found for: "CEA1", "CEA" Lab Results  Component Value Date   PSA1 12.9 (H) 05/26/2020   No results found for: "[id number]" No results found for: "CAN125"  No results found for: "TOTALPROTELP", "ALBUMINELP", "A1GS", "A2GS", "BETS", "BETA2SER", "GAMS", "MSPIKE", "SPEI" Lab Results  Component Value Date   TIBC 336.0 01/09/2021   TIBC 280 04/20/2020   FERRITIN 69.8 01/09/2021   FERRITIN 45 04/20/2020   IRONPCTSAT 45.2 01/09/2021   IRONPCTSAT 31 04/20/2020   No results found for: "LDH"   STUDIES:   No results found.

## 2022-06-20 ENCOUNTER — Inpatient Hospital Stay (HOSPITAL_BASED_OUTPATIENT_CLINIC_OR_DEPARTMENT_OTHER): Payer: BC Managed Care – PPO | Admitting: Hematology

## 2022-06-20 ENCOUNTER — Inpatient Hospital Stay: Payer: BC Managed Care – PPO | Attending: Hematology

## 2022-06-20 ENCOUNTER — Inpatient Hospital Stay: Payer: BC Managed Care – PPO

## 2022-06-20 VITALS — BP 117/68 | HR 47 | Temp 98.2°F | Resp 16

## 2022-06-20 DIAGNOSIS — I10 Essential (primary) hypertension: Secondary | ICD-10-CM | POA: Diagnosis not present

## 2022-06-20 DIAGNOSIS — Z7952 Long term (current) use of systemic steroids: Secondary | ICD-10-CM | POA: Diagnosis not present

## 2022-06-20 DIAGNOSIS — Z85828 Personal history of other malignant neoplasm of skin: Secondary | ICD-10-CM | POA: Insufficient documentation

## 2022-06-20 DIAGNOSIS — R5383 Other fatigue: Secondary | ICD-10-CM | POA: Insufficient documentation

## 2022-06-20 DIAGNOSIS — R110 Nausea: Secondary | ICD-10-CM | POA: Diagnosis not present

## 2022-06-20 DIAGNOSIS — E785 Hyperlipidemia, unspecified: Secondary | ICD-10-CM | POA: Insufficient documentation

## 2022-06-20 DIAGNOSIS — Z5189 Encounter for other specified aftercare: Secondary | ICD-10-CM | POA: Insufficient documentation

## 2022-06-20 DIAGNOSIS — Z79890 Hormone replacement therapy (postmenopausal): Secondary | ICD-10-CM | POA: Diagnosis not present

## 2022-06-20 DIAGNOSIS — E039 Hypothyroidism, unspecified: Secondary | ICD-10-CM | POA: Insufficient documentation

## 2022-06-20 DIAGNOSIS — C7951 Secondary malignant neoplasm of bone: Secondary | ICD-10-CM | POA: Insufficient documentation

## 2022-06-20 DIAGNOSIS — M810 Age-related osteoporosis without current pathological fracture: Secondary | ICD-10-CM | POA: Insufficient documentation

## 2022-06-20 DIAGNOSIS — N1330 Unspecified hydronephrosis: Secondary | ICD-10-CM | POA: Diagnosis not present

## 2022-06-20 DIAGNOSIS — Z87891 Personal history of nicotine dependence: Secondary | ICD-10-CM | POA: Insufficient documentation

## 2022-06-20 DIAGNOSIS — M25552 Pain in left hip: Secondary | ICD-10-CM | POA: Insufficient documentation

## 2022-06-20 DIAGNOSIS — G629 Polyneuropathy, unspecified: Secondary | ICD-10-CM | POA: Diagnosis not present

## 2022-06-20 DIAGNOSIS — E559 Vitamin D deficiency, unspecified: Secondary | ICD-10-CM | POA: Insufficient documentation

## 2022-06-20 DIAGNOSIS — M25551 Pain in right hip: Secondary | ICD-10-CM | POA: Diagnosis not present

## 2022-06-20 DIAGNOSIS — R232 Flushing: Secondary | ICD-10-CM | POA: Insufficient documentation

## 2022-06-20 DIAGNOSIS — C61 Malignant neoplasm of prostate: Secondary | ICD-10-CM | POA: Insufficient documentation

## 2022-06-20 DIAGNOSIS — Z95828 Presence of other vascular implants and grafts: Secondary | ICD-10-CM

## 2022-06-20 DIAGNOSIS — M545 Low back pain, unspecified: Secondary | ICD-10-CM | POA: Diagnosis not present

## 2022-06-20 DIAGNOSIS — Z5111 Encounter for antineoplastic chemotherapy: Secondary | ICD-10-CM | POA: Diagnosis not present

## 2022-06-20 LAB — COMPREHENSIVE METABOLIC PANEL
ALT: 17 U/L (ref 0–44)
AST: 15 U/L (ref 15–41)
Albumin: 3.7 g/dL (ref 3.5–5.0)
Alkaline Phosphatase: 82 U/L (ref 38–126)
Anion gap: 7 (ref 5–15)
BUN: 17 mg/dL (ref 8–23)
CO2: 23 mmol/L (ref 22–32)
Calcium: 9 mg/dL (ref 8.9–10.3)
Chloride: 103 mmol/L (ref 98–111)
Creatinine, Ser: 0.78 mg/dL (ref 0.61–1.24)
GFR, Estimated: >60 mL/min (ref >60)
Glucose, Bld: 106 mg/dL — ABNORMAL HIGH (ref 70–99)
Potassium: 4 mmol/L (ref 3.5–5.1)
Sodium: 133 mmol/L — ABNORMAL LOW (ref 135–145)
Total Bilirubin: 0.7 mg/dL (ref 0.3–1.2)
Total Protein: 6.3 g/dL — ABNORMAL LOW (ref 6.5–8.1)

## 2022-06-20 LAB — CBC WITH DIFFERENTIAL/PLATELET
Abs Immature Granulocytes: 0.05 10*3/uL (ref 0.00–0.07)
Basophils Absolute: 0.1 10*3/uL (ref 0.0–0.1)
Basophils Relative: 1 %
Eosinophils Absolute: 0.1 10*3/uL (ref 0.0–0.5)
Eosinophils Relative: 1 %
HCT: 32 % — ABNORMAL LOW (ref 39.0–52.0)
Hemoglobin: 11 g/dL — ABNORMAL LOW (ref 13.0–17.0)
Immature Granulocytes: 1 %
Lymphocytes Relative: 13 %
Lymphs Abs: 0.9 10*3/uL (ref 0.7–4.0)
MCH: 32.7 pg (ref 26.0–34.0)
MCHC: 34.4 g/dL (ref 30.0–36.0)
MCV: 95.2 fL (ref 80.0–100.0)
Monocytes Absolute: 0.8 10*3/uL (ref 0.1–1.0)
Monocytes Relative: 11 %
Neutro Abs: 5.2 10*3/uL (ref 1.7–7.7)
Neutrophils Relative %: 73 %
Platelets: 213 10*3/uL (ref 150–400)
RBC: 3.36 MIL/uL — ABNORMAL LOW (ref 4.22–5.81)
RDW: 14.1 % (ref 11.5–15.5)
WBC: 7.1 10*3/uL (ref 4.0–10.5)
nRBC: 0 % (ref 0.0–0.2)

## 2022-06-20 LAB — MAGNESIUM: Magnesium: 2.1 mg/dL (ref 1.7–2.4)

## 2022-06-20 LAB — PSA: Prostatic Specific Antigen: 31.06 ng/mL — ABNORMAL HIGH (ref 0.00–4.00)

## 2022-06-20 MED ORDER — HEPARIN SOD (PORK) LOCK FLUSH 100 UNIT/ML IV SOLN: 500.0000 [IU] | Freq: Once | INTRAVENOUS | Status: DC

## 2022-06-20 MED ORDER — SODIUM CHLORIDE 0.9 % IV SOLN: Freq: Once | INTRAVENOUS | Status: AC

## 2022-06-20 MED ORDER — CETIRIZINE HCL 10 MG/ML IV SOLN
10.0000 mg | Freq: Once | INTRAVENOUS | Status: AC
  Administered 2022-06-20: 10 mg via INTRAVENOUS
  Filled 2022-06-20: qty 1

## 2022-06-20 MED ORDER — SODIUM CHLORIDE 0.9 % IV SOLN
150.0000 mg | Freq: Once | INTRAVENOUS | Status: AC
  Administered 2022-06-20: 150 mg via INTRAVENOUS
  Filled 2022-06-20: qty 150

## 2022-06-20 MED ORDER — SODIUM CHLORIDE 0.9 % IV SOLN
10.0000 mg | Freq: Once | INTRAVENOUS | Status: AC
  Administered 2022-06-20: 10 mg via INTRAVENOUS
  Filled 2022-06-20: qty 10

## 2022-06-20 MED ORDER — SODIUM CHLORIDE 0.9 % IV SOLN
20.0000 mg/m2 | Freq: Once | INTRAVENOUS | Status: AC
  Administered 2022-06-20: 38 mg via INTRAVENOUS
  Filled 2022-06-20: qty 3.8

## 2022-06-20 MED ORDER — SODIUM CHLORIDE 0.9% FLUSH: 10.0000 mL | INTRAVENOUS | Status: DC | PRN

## 2022-06-20 MED ORDER — PALONOSETRON HCL INJECTION 0.25 MG/5ML
0.2500 mg | Freq: Once | INTRAVENOUS | Status: AC
  Administered 2022-06-20: 0.25 mg via INTRAVENOUS
  Filled 2022-06-20: qty 5

## 2022-06-20 MED ORDER — HYDROCODONE-ACETAMINOPHEN 5-325 MG PO TABS: 1.0000 | ORAL_TABLET | Freq: Three times a day (TID) | ORAL | 0 refills | Status: DC | PRN

## 2022-06-20 MED ORDER — FAMOTIDINE IN NACL 20-0.9 MG/50ML-% IV SOLN
20.0000 mg | Freq: Once | INTRAVENOUS | Status: AC
  Administered 2022-06-20: 20 mg via INTRAVENOUS
  Filled 2022-06-20: qty 50

## 2022-06-20 NOTE — Progress Notes (Signed)
Patient presents today for chemotherapy infusion. Patient is in satisfactory condition with no new complaints voiced.  Vital signs are stable.  Labs reviewed by Dr. Katragadda during the office visit and all labs are within treatment parameters.  We will proceed with treatment per MD orders.   Patient tolerated treatment well with no complaints voiced.  Patient left ambulatory in stable condition.  Vital signs stable at discharge.  Follow up as scheduled.       

## 2022-06-20 NOTE — Patient Instructions (Signed)
[facility] at [facility] Discharge Instructions   You were seen and examined today by Dr. Katragadda.  He reviewed the results of your lab work which are normal/stable.   We will proceed with your treatment today.  Return as scheduled.    Thank you for choosing [facility] at [facility] to provide your oncology and hematology care.  To afford each patient quality time with our provider, please arrive at least 15 minutes before your scheduled appointment time.   If you have a lab appointment with the Cancer Center please come in thru the Main Entrance and check in at the main information desk.  You need to re-schedule your appointment should you arrive 10 or more minutes late.  We strive to give you quality time with our providers, and arriving late affects you and other patients whose appointments are after yours.  Also, if you no show three or more times for appointments you may be dismissed from the clinic at the providers discretion.     Again, thank you for choosing [facility].  Our hope is that these requests will decrease the amount of time that you wait before being seen by our physicians.       _____________________________________________________________  Should you have questions after your visit to  Cancer Center, please contact our office at [phone] and follow the prompts.  Our office hours are 8:00 a.m. and 4:30 p.m. Monday - Friday.  Please note that voicemails left after 4:00 p.m. may not be returned until the following business day.  We are closed weekends and major holidays.  You do have access to a nurse 24-7, just call the main number to the clinic [phone] and do not press any options, hold on the line and a nurse will answer the phone.    For prescription refill requests, have your pharmacy contact our office and allow 72 hours.    Due to Covid, you will need to wear a mask upon entering  the hospital. If you do not have a mask, a mask will be given to you at the Main Entrance upon arrival. For doctor visits, patients may have 1 support person age 18 or older with them. For treatment visits, patients can not have anyone with them due to social distancing guidelines and our immunocompromised population.      

## 2022-06-20 NOTE — Patient Instructions (Signed)
[facility]  Discharge Instructions: Thank you for choosing [facility] to provide your oncology and hematology care.  If you have a lab appointment with the [facility] - please note that after April 8th, 2024, all labs will be drawn in the cancer center.  You do not have to check in or register with the main entrance as you have in the past but will complete your check-in in the cancer center.  Wear comfortable clothing and clothing appropriate for easy access to any Portacath or PICC line.   We strive to give you quality time with your provider. You may need to reschedule your appointment if you arrive late (15 or more minutes).  Arriving late affects you and other patients whose appointments are after yours.  Also, if you miss three or more appointments without notifying the office, you may be dismissed from the clinic at the provider's discretion.      For prescription refill requests, have your pharmacy contact our office and allow 72 hours for refills to be completed.    Today you received the following chemotherapy and/or immunotherapy agents Jevtana.  Cabazitaxel Injection What is this medication? CABAZITAXEL (ka BAZ i TAX el) treats prostate cancer. It works by slowing down the growth of cancer cells. This medicine may be used for other purposes; ask your health care provider or pharmacist if you have questions. COMMON BRAND NAME(S): Jevtana What should I tell my care team before I take this medication? They need to know if you have any of these conditions: Kidney problems Liver disease Low white blood cell levels Lung disease Stomach or intestine problems An unusual or allergic reaction to cabazitaxel, polysorbate 80, other medications, foods, dyes, or preservatives Pregnant or trying to get pregnant Breast-feeding How should I use this medication? This medication is injected into a vein. It is given by your care team in a hospital or  clinic setting. Talk to your care team about the use of this medication in children. Special care may be needed. Overdosage: If you think you have taken too much of this medicine contact a poison control center or emergency room at once. NOTE: This medicine is only for you. Do not share this medicine with others. What if I miss a dose? Keep appointments for follow-up doses. It is important not to miss your dose. Call your care team if you are unable to keep an appointment. What may interact with this medication? Certain antibiotics, such as clarithromycin or telithromycin Certain antivirals for HIV or AIDS Certain medications for fungal infections like ketoconazole, itraconazole, and voriconazole Nefazodone This list may not describe all possible interactions. Give your health care provider a list of all the medicines, herbs, non-prescription drugs, or dietary supplements you use. Also tell them if you smoke, drink alcohol, or use illegal drugs. Some items may interact with your medicine. What should I watch for while using this medication? This medication may make you feel generally unwell. This is not uncommon as chemotherapy can affect healthy cells as well as cancer cells. Report any side effects. Continue your course of treatment even though you feel ill unless your care team tells you to stop. You may need blood work while you are taking this medication. This medication may increase your risk of getting an infection. Call your care team for advice if you get a fever, chills, sore throat, or other symptoms of a cold or flu. Do not treat yourself. Try to avoid being around  people who are sick. Avoid taking medications that contain aspirin, acetaminophen, ibuprofen, naproxen, or ketoprofen unless instructed by your care team. These medications may hide a fever. Be careful brushing or flossing your teeth or using a toothpick because you may get an infection or bleed more easily. If you have any  dental work done, tell your dentist you are receiving this medication. This medication can cause serious infusion reactions. To reduce the risk, your care team may give you other medications to take before receiving this one. Be sure to follow the directions from your care team. Use a condom during sex while taking this medication and for 4 months after the last dose. Talk to your care team right away if your partner may be pregnant. This medication can cause serious birth defects. This medication may cause infertility. Talk to your care team if you are concerned about your fertility. What side effects may I notice from receiving this medication? Side effects that you should report to your care team as soon as possible: Allergic reactions--skin rash, itching, hives, swelling of the face, lips, tongue, or throat Diarrhea, nausea, vomiting Dry cough, shortness of breath or trouble breathing Infection--fever, chills, cough, or sore throat Kidney injury--decrease in the amount of urine, swelling of the ankles, hands, or feet Pain, tingling, or numbness in the hands or feet Red or dark Anira [place] urine Stomach bleeding--bloody or black, tar-like stools, vomiting blood or Chrysten [place]oulfe material that looks like coffee grounds Stomach pain that is severe, does not away, or gets worse Unusual bruising or bleeding Side effects that usually do not require medical attention (report these to your care team if they continue or are bothersome): Loss of appetite Unusual weakness or fatigue This list may not describe all possible side effects. Call your doctor for medical advice about side effects. You may report side effects to FDA at 1-800-FDA-1088. Where should I keep my medication? This medication is given in a hospital or clinic. It will not be stored at home. NOTE: This sheet is a summary. It may not cover all possible information. If you have questions about this medicine, talk to your doctor, pharmacist, or health  care provider.  2023 Elsevier/Gold Standard (2008-09-15 00:00:00)        To help prevent nausea and vomiting after your treatment, we encourage you to take your nausea medication as directed.  BELOW ARE SYMPTOMS THAT SHOULD BE REPORTED IMMEDIATELY: *FEVER GREATER THAN 100.4 F (38 C) OR HIGHER *CHILLS OR SWEATING *NAUSEA AND VOMITING THAT IS NOT CONTROLLED WITH YOUR NAUSEA MEDICATION *UNUSUAL SHORTNESS OF BREATH *UNUSUAL BRUISING OR BLEEDING *URINARY PROBLEMS (pain or burning when urinating, or frequent urination) *BOWEL PROBLEMS (unusual diarrhea, constipation, pain near the anus) TENDERNESS IN MOUTH AND THROAT WITH OR WITHOUT PRESENCE OF ULCERS (sore throat, sores in mouth, or a toothache) UNUSUAL RASH, SWELLING OR PAIN  UNUSUAL VAGINAL DISCHARGE OR ITCHING   Items with * indicate a potential emergency and should be followed up as soon as possible or go to the Emergency Department if any problems should occur.  Please show the CHEMOTHERAPY ALERT CARD or IMMUNOTHERAPY ALERT CARD at check-in to the Emergency Department and triage nurse.  Should you have questions after your visit or need to cancel or reschedule your appointment, please contact [facility] [phone]  and follow the prompts.  Office hours are 8:00 a.m. to 4:30 p.m. Monday - Friday. Please note that voicemails left after 4:00 p.m. may not be returned until the following business  day.  We are closed weekends and major holidays. You have access to a nurse at all times for urgent questions. Please call the main number to the clinic [phone] and follow the prompts.  For any non-urgent questions, you may also contact your provider using MyChart. We now offer e-Visits for anyone 66 and older to request care online for non-urgent symptoms. For details visit [email or web address].   Also download the MyChart app! Go to the app store, search "MyChart", open the app, select [facility], and log in with  your MyChart username and password.

## 2022-06-20 NOTE — Progress Notes (Signed)
Patient has been examined by Dr. Katragadda. Vital signs and labs have been reviewed by MD - ANC, Creatinine, LFTs, hemoglobin, and platelets are within treatment parameters per M.D. - pt may proceed with treatment.  Primary RN and pharmacy notified.  

## 2022-06-22 ENCOUNTER — Inpatient Hospital Stay: Payer: BC Managed Care – PPO

## 2022-06-22 VITALS — BP 148/90 | HR 70 | Temp 97.3°F | Resp 18

## 2022-06-22 DIAGNOSIS — C61 Malignant neoplasm of prostate: Secondary | ICD-10-CM | POA: Diagnosis not present

## 2022-06-22 MED ORDER — PEGFILGRASTIM-CBQV 6 MG/0.6ML ~~LOC~~ SOSY
6.0000 mg | PREFILLED_SYRINGE | Freq: Once | SUBCUTANEOUS | Status: AC
  Administered 2022-06-22: 6 mg via SUBCUTANEOUS
  Filled 2022-06-22: qty 0.6

## 2022-06-22 NOTE — Patient Instructions (Signed)
[facility]  Discharge Instructions: Thank you for choosing [facility] to provide your oncology and hematology care.  If you have a lab appointment with the Cancer Center - please note that after April 8th, 2024, all labs will be drawn in the cancer center.  You do not have to check in or register with the main entrance as you have in the past but will complete your check-in in the cancer center.  Wear comfortable clothing and clothing appropriate for easy access to any Portacath or PICC line.   We strive to give you quality time with your provider. You may need to reschedule your appointment if you arrive late (15 or more minutes).  Arriving late affects you and other patients whose appointments are after yours.  Also, if you miss three or more appointments without notifying the office, you may be dismissed from the clinic at the provider's discretion.      For prescription refill requests, have your pharmacy contact our office and allow 72 hours for refills to be completed.    Today you received the following chemotherapy and/or immunotherapy agents Udenyca.  Pegfilgrastim Injection What is this medication? PEGFILGRASTIM (PEG fil gra stim) lowers the risk of infection in people who are receiving chemotherapy. It works by helping your body make more white blood cells, which protects your body from infection. It may also be used to help people who have been exposed to high doses of radiation. This medicine may be used for other purposes; ask your health care provider or pharmacist if you have questions. COMMON BRAND NAME(S): Fulphila, Fylnetra, Neulasta, Nyvepria, Stimufend, UDENYCA, Ziextenzo What should I tell my care team before I take this medication? They need to know if you have any of these conditions: Kidney disease Latex allergy Ongoing radiation therapy Sickle cell disease Skin reactions to acrylic adhesives (On-Body Injector only) An unusual or  allergic reaction to pegfilgrastim, filgrastim, other medications, foods, dyes, or preservatives Pregnant or trying to get pregnant Breast-feeding How should I use this medication? This medication is for injection under the skin. If you get this medication at home, you will be taught how to prepare and give the pre-filled syringe or how to use the On-body Injector. Refer to the patient Instructions for Use for detailed instructions. Use exactly as directed. Tell your care team immediately if you suspect that the On-body Injector may not have performed as intended or if you suspect the use of the On-body Injector resulted in a missed or partial dose. It is important that you put your used needles and syringes in a special sharps container. Do not put them in a trash can. If you do not have a sharps container, call your pharmacist or care team to get one. Talk to your care team about the use of this medication in children. While this medication may be prescribed for selected conditions, precautions do apply. Overdosage: If you think you have taken too much of this medicine contact a poison control center or emergency room at once. NOTE: This medicine is only for you. Do not share this medicine with others. What if I miss a dose? It is important not to miss your dose. Call your care team if you miss your dose. If you miss a dose due to an On-body Injector failure or leakage, a new dose should be administered as soon as possible using a single prefilled syringe for manual use. What may interact with this medication? Interactions have not   been studied. This list may not describe all possible interactions. Give your health care provider a list of all the medicines, herbs, non-prescription drugs, or dietary supplements you use. Also tell them if you smoke, drink alcohol, or use illegal drugs. Some items may interact with your medicine. What should I watch for while using this medication? Your condition will  be monitored carefully while you are receiving this medication. You may need blood work done while you are taking this medication. Talk to your care team about your risk of cancer. You may be more at risk for certain types of cancer if you take this medication. If you are going to need a MRI, CT scan, or other procedure, tell your care team that you are using this medication (On-Body Injector only). What side effects may I notice from receiving this medication? Side effects that you should report to your care team as soon as possible: Allergic reactions--skin rash, itching, hives, swelling of the face, lips, tongue, or throat Capillary leak syndrome--stomach or muscle pain, unusual weakness or fatigue, feeling faint or lightheaded, decrease in the amount of urine, swelling of the ankles, hands, or feet, trouble breathing High white blood cell level--fever, fatigue, trouble breathing, night sweats, change in vision, weight loss Inflammation of the aorta--fever, fatigue, back, chest, or stomach pain, severe headache Kidney injury (glomerulonephritis)--decrease in the amount of urine, red or dark Scott [name] urine, foamy or bubbly urine, swelling of the ankles, hands, or feet Shortness of breath or trouble breathing Spleen injury--pain in upper left stomach or shoulder Unusual bruising or bleeding Side effects that usually do not require medical attention (report to your care team if they continue or are bothersome): Bone pain Pain in the hands or feet This list may not describe all possible side effects. Call your doctor for medical advice about side effects. You may report side effects to FDA at 1-800-FDA-1088. Where should I keep my medication? Keep out of the reach of children. If you are using this medication at home, you will be instructed on how to store it. Throw away any unused medication after the expiration date on the label. NOTE: This sheet is a summary. It may not cover all possible  information. If you have questions about this medicine, talk to your doctor, pharmacist, or health care provider.  2023 Elsevier/Gold Standard (2020-11-18 00:00:00)        To help prevent nausea and vomiting after your treatment, we encourage you to take your nausea medication as directed.  BELOW ARE SYMPTOMS THAT SHOULD BE REPORTED IMMEDIATELY: *FEVER GREATER THAN 100.4 F (38 C) OR HIGHER *CHILLS OR SWEATING *NAUSEA AND VOMITING THAT IS NOT CONTROLLED WITH YOUR NAUSEA MEDICATION *UNUSUAL SHORTNESS OF BREATH *UNUSUAL BRUISING OR BLEEDING *URINARY PROBLEMS (pain or burning when urinating, or frequent urination) *BOWEL PROBLEMS (unusual diarrhea, constipation, pain near the anus) TENDERNESS IN MOUTH AND THROAT WITH OR WITHOUT PRESENCE OF ULCERS (sore throat, sores in mouth, or a toothache) UNUSUAL RASH, SWELLING OR PAIN  UNUSUAL VAGINAL DISCHARGE OR ITCHING   Items with * indicate a potential emergency and should be followed up as soon as possible or go to the Emergency Department if any problems should occur.  Please show the CHEMOTHERAPY ALERT CARD or IMMUNOTHERAPY ALERT CARD at check-in to the Emergency Department and triage nurse.  Should you have questions after your visit or need to cancel or reschedule your appointment, please contact [facility] [phone]  and follow the prompts.  Office hours are 8:00  a.m. to 4:30 p.m. Monday - Friday. Please note that voicemails left after 4:00 p.m. may not be returned until the following business day.  We are closed weekends and major holidays. You have access to a nurse at all times for urgent questions. Please call the main number to the clinic [phone] and follow the prompts.  For any non-urgent questions, you may also contact your provider using MyChart. We now offer e-Visits for anyone 61 and older to request care online for non-urgent symptoms. For details visit [email or web address].   Also download the  MyChart app! Go to the app store, search "MyChart", open the app, select [facility], and log in with your MyChart username and password.

## 2022-06-22 NOTE — Progress Notes (Signed)
Patient tolerated Udenyca injection with no complaints voiced.  Site clean and dry with no bruising or swelling noted.  No complaints of pain.  Discharged with vital signs stable and no signs or symptoms of distress noted.  

## 2022-07-07 ENCOUNTER — Ambulatory Visit
Admission: EM | Admit: 2022-07-07 | Discharge: 2022-07-07 | Disposition: A | Discharge status: Home / Self Care | Payer: BC Managed Care – PPO | Attending: Family Medicine | Admitting: Family Medicine

## 2022-07-07 ENCOUNTER — Other Ambulatory Visit: Payer: Self-pay

## 2022-07-07 ENCOUNTER — Encounter: Payer: Self-pay | Admitting: Emergency Medicine

## 2022-07-07 DIAGNOSIS — R238 Other skin changes: Secondary | ICD-10-CM

## 2022-07-07 DIAGNOSIS — S40861A Insect bite (nonvenomous) of right upper arm, initial encounter: Secondary | ICD-10-CM | POA: Diagnosis not present

## 2022-07-07 DIAGNOSIS — W57XXXA Bitten or stung by nonvenomous insect and other nonvenomous arthropods, initial encounter: Secondary | ICD-10-CM | POA: Diagnosis not present

## 2022-07-07 MED ORDER — DOXYCYCLINE HYCLATE 100 MG PO CAPS: ORAL_CAPSULE | ORAL | 0 refills | Status: DC

## 2022-07-07 NOTE — ED Triage Notes (Signed)
Pt reports was on the way home and reports had a itch to back of right arm and reports wife stated "its a big tick". Pt reports removed from site, redness and discoloration to skin noted.   Pt reports is currently undergoing chemo.

## 2022-07-09 NOTE — ED Provider Notes (Signed)
[facility] CARE CENTER   [id number] 07/07/22 Arrival Time: 1329  ASSESSMENT & PLAN:  1. Skin irritation   2. Tick bite of right upper arm, initial encounter     No sign of infection. Reports redness at site of bite but this is stable and not worsening; discussed this is likely inflammatory secondary to tick bite. Precautions to monitor for flu-like symptoms or fever along with any new rashes. Should these develop he will seek follow up. Given his medical history, will give prophylactic doxy.  Meds ordered this encounter  Medications   doxycycline (VIBRAMYCIN) 100 MG capsule    Sig: Take 2 capsules by mouth as a single dose.    Dispense:  2 capsule    Refill:  0     Follow-up Information     Etta Grandchild, MD.   Specialty: Internal Medicine Why: As needed. Contact information: [address] [place] [address] [phone]                See AVS for d/c information.  Reviewed expectations re: course of current medical issues. Questions answered. Outlined signs and symptoms indicating need for more acute intervention. Patient verbalized understanding. After Visit Summary given.   SUBJECTIVE: History from: patient. Scott [name] is a 67 y.o. male who reports was on the way home and reports had a itch to back of right arm and reports finding non-engorged tick; removed by wife. Some redness and discoloration to skin noted.   Pt reports is currently undergoing chemo.    OBJECTIVE:  Vitals:   07/07/22 1410  BP: 115/67  Pulse: (!) 59  Resp: 20  Temp: 98.2 F (36.8 C)  TempSrc: Oral  SpO2: 96%    General appearance: alert; no distress Eyes: PERRLA; EOMI; conjunctiva normal HENT: normocephalic; atraumatic Lungs: unlabored Abdomen: soft, non-tender Back: no CVA tenderness Extremities: no edema; symmetrical with no gross deformities Skin: warm and dry; very slight sub-cm induration of upper R arm; with localized surrounding mild erythema;  without wheals; normal skin temperature; no visible foreign bodies or parts of tick appreciated; no sign of infection Neurologic: normal gait; normal symmetric reflexes Psychological: alert and cooperative; normal mood and affect   Allergies  Allergen Reactions   Crestor [Rosuvastatin] Other (See Comments)    myalgia   Ivp Dye [Iodinated Contrast Media] Swelling    Swelling on head, took Benadryl and swelling reduced   Wellbutrin [Bupropion] Hives    Past Medical History:  Diagnosis Date   Bladder obstruction    Complication of anesthesia    Diverticulitis 2015   Dyspnea    Essential hypertension, benign 01/26/2019   at doctor's office elevated, normal at home, no medications at this time   History of basal cell cancer    HLD (hyperlipidemia) 01/26/2019   Hypothyroidism    history of, not currently taking medication   PONV (postoperative nausea and vomiting)    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   Vitamin D deficiency disease 01/26/2019   Social History   Socioeconomic History   Marital status: Married    Spouse name: Not on file   Number of children: 1   Years of education: Not on file   Highest education level: Not on file  Occupational History    Comment: working full time  Tobacco Use   Smoking status: Former    Packs/day: 1.50    Years: 35.00    Additional pack years: 0.00    Total pack years: 52.50  Types: Cigarettes    Quit date: 01/12/2004    Years since quitting: 18.5   Smokeless tobacco: Never   Tobacco comments:    OVER 30 YEARS HX OF SMOKING   Vaping Use   Vaping Use: Never used  Substance and Sexual Activity   Alcohol use: Not Currently    Comment: heavy drinker until 2004   Drug use: Not Currently    Types: Marijuana   Sexual activity: Not Currently  Other Topics Concern   Not on file  Social History Narrative   Married for 19 years.Works for [employer] from home.   Social Determinants of Health   Financial Resource Strain:  Low Risk  (03/02/2020)   Overall Financial Resource Strain (CARDIA)    Difficulty of Paying Living Expenses: Not hard at all  Food Insecurity: No Food Insecurity (03/02/2020)   Hunger Vital Sign    Worried About Running Out of Food in the Last Year: Never true    Ran Out of Food in the Last Year: Never true  Transportation Needs: No Transportation Needs (03/02/2020)   PRAPARE - Administrator, Civil Service (Medical): No    Lack of Transportation (Non-Medical): No  Physical Activity: Inactive (03/02/2020)   Exercise Vital Sign    Days of Exercise per Week: 0 days    Minutes of Exercise per Session: 0 min  Stress: No Stress Concern Present (03/02/2020)   [employer] of Occupational Health - Occupational Stress Questionnaire    Feeling of Stress : Only a little  Social Connections: Moderately Integrated (03/02/2020)   Social Connection and Isolation Panel [NHANES]    Frequency of Communication with Friends and Family: Three times a week    Frequency of Social Gatherings with Friends and Family: Never    Attends Religious Services: Never    Database administrator or Organizations: Yes    Attends Banker Meetings: 1 to 4 times per year    Marital Status: Married  Catering manager Violence: Not At Risk (03/02/2020)   Humiliation, Afraid, Rape, and Kick questionnaire    Fear of Current or Ex-Partner: No    Emotionally Abused: No    Physically Abused: No    Sexually Abused: No   Family History  Problem Relation Age of Onset   Emphysema Mother    Alzheimer's disease Father    Prostate cancer Father        dx in his 76s   Prostate cancer Brother        dx in his 50s   Breast cancer Maternal Grandmother        60s   Pancreatic cancer Maternal Grandfather        69s   Dementia Paternal Grandmother    Heart attack Paternal Grandfather    Colon cancer Neg Hx    Past Surgical History:  Procedure Laterality Date   CYSTOSCOPY W/ URETERAL STENT  PLACEMENT Left 11/10/2019   Procedure: CYSTOSCOPY;  Surgeon: Bjorn Pippin, MD;  Location: [facility];  Service: Urology;  Laterality: Left;   ESOPHAGOGASTRODUODENOSCOPY (EGD) WITH PROPOFOL N/A 07/07/2020   Procedure: ESOPHAGOGASTRODUODENOSCOPY (EGD) WITH PROPOFOL;  Surgeon: Corbin Ade, MD;  Location: [facility];  Service: Endoscopy;  Laterality: N/A;  pm appt   HERNIA REPAIR  1995   UMBILICAL    IR FLUORO GUIDED NEEDLE PLC ASPIRATION/INJECTION LOC  04/09/2022   MALONEY DILATION N/A 07/07/2020   Procedure: MALONEY DILATION;  Surgeon: Corbin Ade, MD;  Location: [facility];  Service: Endoscopy;  Laterality: N/A;   PROSTATE SURGERY     SKIN CANCER EXCISION     TONSILLECTOMY Bilateral    TOOTH EXTRACTION     front left tooth pulled but no surgery or stitches   TRANSURETHRAL RESECTION OF PROSTATE N/A 01/25/2017   Procedure: TRANSURETHRAL RESECTION OF THE PROSTATE (TURP);  Surgeon: Sebastian Ache, MD;  Location: [facility];  Service: Urology;  Laterality: N/A;   TRANSURETHRAL RESECTION OF PROSTATE N/A 11/10/2019   Procedure: TRANSURETHRAL RESECTION OF THE PROSTATE (TURP);  Surgeon: Bjorn Pippin, MD;  Location: [facility];  Service: Urology;  Laterality: N/A;   WISDOM TOOTH EXTRACTION        Mardella Layman, MD 07/09/22 [phone]

## 2022-07-10 ENCOUNTER — Other Ambulatory Visit: Payer: Self-pay

## 2022-07-10 DIAGNOSIS — C61 Malignant neoplasm of prostate: Secondary | ICD-10-CM

## 2022-07-10 NOTE — Progress Notes (Signed)
[facility] [address], [place] [address]    Clinic Day:  07/11/2022  Referring physician: Etta Grandchild, MD  Patient Care Team: Etta Grandchild, MD as PCP - General (Internal Medicine) Doreatha Massed, MD as Consulting Physician (Hematology and Oncology) Jena Gauss Gerrit Friends, MD as Consulting Physician (Gastroenterology) Therese Sarah, RN as Oncology Nurse Navigator (Medical Oncology)   ASSESSMENT & PLAN:   Assessment: 1.  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  Prominent left hydronephrosis and hydroureter noted.  New sclerotic lesion in the left anterior vertebral body of L1.  Mild left periaortic adenopathy increased from prior, short axis lymph node measuring 1 cm. -Last PSA increased to 15.8 on 09/28/2019. -As there is rapid progression of disease, I have recommended docetaxel chemotherapy.  He is agreeable after long discussion. -Cystoscopy with transurethral resection of tumor in the posterior bladder neck, right and left walls of the bladder, right lobe of the prostate on 11/10/2019. -Plan was to do 6 cycles of docetaxel followed by abiraterone and prednisone. -6 cycles of docetaxel from 11/16/2019 through 02/29/2020. -CTAP on 01/06/2020 showed persistent thickening of the urinary bladder wall.  Left hydronephrosis is significantly reduced compared to prior exam.  Left-sided retroperitoneal and left iliac lymph nodes reduced in size.  Interval increase in sclerosis of lesions of the left aspect of L2 vertebral body consistent with treatment response.  No new bone lesions. -Bone scan on 01/06/2020 shows stable L2 metastatic focus. -Bone scan scan images from 06/14/2020 which showed progressive metastatic disease, with increased size and intensity of L2 1 new focus of activity at  L1. 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 -Abiraterone and prednisone from 06/24/2020 through 09/06/2021 with progression. - PSMA PET scan (09/28/2021): Residual/recurrent tumor involving prostate gland and seminal vesicles.  Tracer avid bone metastasis localized L1 and L2 vertebral bodies.  No new sites of bone metastasis disease.  No solid organ or nodal mets. - Darolutamide from 10/17/2021 through 03/15/2022 with progression - Guardant360: MSI high not detected.  Androgen resistance present. - XRT to L2-L4, 30 Gray in 10 fractions completed on 04/23/2022 with improvement in pain. - Cabazitaxel cycle 1 on 05/07/2022.   2.  Left hydroureteronephrosis: -CT scan showed prostate mass invading the posterior bladder and obstructing the ureter.  Creatinine increased to 1.1. -Renal ultrasound on 12/11/2019 showed interval resolution of previously seen left-sided hydronephrosis.  Previously seen soft tissue mass at the posterior bladder lumen no longer seen.    Plan: 1.  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 - Labs today: Normal LFTs.  CBC grossly normal.  PSA was 31, down from 44. - Proceed with cycle 4 without any dose modifications. - Will consider imaging if PSA gets worse.   2.  Hot flashes: - Continue estradiol patch twice weekly for hot flashes.   3.  Peripheral neuropathy: - Slight worsening of the tingling of the left foot and new finding of fingertips being sensitive.  Will closely monitor.   4.  Lower back pain/bilateral hip pains: - Continue tramadol 50 mg at bedtime as needed. - Continue hydrocodone 5/325 for the first few days when the pains are intense from growth factor.  5.  Osteoporosis (DEXA scan 02/24/2021 T-score -2.5): - Continue calcium and vitamin D supplements.  Orders Placed This Encounter  Procedures   Magnesium    Standing Status:   Future    Standing Expiration Date:   07/31/2023   PSA    Standing Status:    Future    Standing Expiration Date:   07/31/2023   CBC with Differential    Standing Status:   Future    Standing Expiration Date:   07/31/2023   Comprehensive metabolic panel    Standing Status:   Future    Standing Expiration Date:   07/31/2023   Magnesium    Standing Status:   Future    Standing Expiration Date:   08/21/2023   PSA    Standing Status:   Future    Standing Expiration Date:   08/21/2023   CBC with Differential    Standing Status:   Future    Standing Expiration Date:   08/21/2023   Comprehensive metabolic panel    Standing Status:   Future    Standing Expiration Date:   08/21/2023      I,Katie Daubenspeck,acting as a scribe for Doreatha Massed, MD.,have documented all relevant documentation on the behalf of Doreatha Massed, MD,as directed by  Doreatha Massed, MD while in the presence of Doreatha Massed, MD.   I, Doreatha Massed MD, have reviewed the above documentation for accuracy and completeness, and I agree with the above.   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   Prior Therapy: 1. TURP on 11/10/2019. 2. Docetaxel x 6 cycles from 11/16/2019 to 02/29/2020. 3.  Abiraterone and prednisone 4.  Darolutamide  Current Therapy:  Cabazitaxel every 21 days    HISTORY OF PRESENT ILLNESS:   Oncology History  Prostate cancer metastatic to multiple sites  01/25/2017 Initial Diagnosis   Prostate cancer metastatic to multiple sites [facility])   11/16/2019 - 03/02/2020 Chemotherapy   Patient is on Treatment Plan : PROSTATE Docetaxel + Prednisone q21d      Genetic Testing   Negative genetic testing. No pathogenic variants identified on the [facility] CancerNext-Expanded panel. 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 The report date is 04/19/2020.  The CancerNext-Expanded  gene panel offered  by [facility] and includes sequencing and rearrangement analysis for the following 77 genes: IP, ALK, APC*, ATM*, AXIN2, BAP1, BARD1, BLM, BMPR1A, BRCA1*, BRCA2*, BRIP1*, CDC73, CDH1*,CDK4, CDKN1B, CDKN2A, CHEK2*, CTNNA1, DICER1, FANCC, FH, FLCN, GALNT12, KIF1B, LZTR1, MAX, MEN1, MET, MLH1*, MSH2*, MSH3, MSH6*, MUTYH*, NBN, NF1*, NF2, NTHL1, PALB2*, PHOX2B, PMS2*, POT1, PRKAR1A, PTCH1, PTEN*, RAD51C*, RAD51D*,RB1, RECQL, RET, SDHA, SDHAF2, SDHB, SDHC, SDHD, SMAD4, SMARCA4, SMARCB1, SMARCE1, STK11, SUFU, TMEM127, TP53*,TSC1, TSC2, VHL and XRCC2 (sequencing and deletion/duplication); EGFR, EGLN1, HOXB13, KIT, MITF, PDGFRA, POLD1 and POLE (sequencing only); EPCAM and GREM1 (deletion/duplication only).    05/07/2022 -  Chemotherapy   Patient is on Treatment Plan : PROSTATE Cabazitaxel (20) D1 + Prednisone D1-21 q21d        INTERVAL HISTORY:   Scott [name] is a 67 y.o. 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 He was last seen by me on 06/20/22.  Today, he states that he is doing well overall. His appetite level is at 50%. His energy level is at 50%.  PAST MEDICAL HISTORY:   Past Medical History: Past Medical History:  Diagnosis Date   Bladder obstruction  Complication of anesthesia    Diverticulitis 2015   Dyspnea    Essential hypertension, benign 01/26/2019   at doctor's office elevated, normal at home, no medications at this time   History of basal cell cancer    HLD (hyperlipidemia) 01/26/2019   Hypothyroidism    history of, not currently taking medication   PONV (postoperative nausea and vomiting)    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   Vitamin D deficiency disease 01/26/2019    Surgical History: Past Surgical History:  Procedure Laterality Date   CYSTOSCOPY W/ URETERAL STENT PLACEMENT Left 11/10/2019   Procedure: CYSTOSCOPY;  Surgeon: Bjorn Pippin, MD;  Location: [facility];  Service: Urology;  Laterality: Left;    ESOPHAGOGASTRODUODENOSCOPY (EGD) WITH PROPOFOL N/A 07/07/2020   Procedure: ESOPHAGOGASTRODUODENOSCOPY (EGD) WITH PROPOFOL;  Surgeon: Corbin Ade, MD;  Location: [facility];  Service: Endoscopy;  Laterality: N/A;  pm appt   HERNIA REPAIR  1995   UMBILICAL    IR FLUORO GUIDED NEEDLE PLC ASPIRATION/INJECTION LOC  04/09/2022   MALONEY DILATION N/A 07/07/2020   Procedure: MALONEY DILATION;  Surgeon: Corbin Ade, MD;  Location: [facility];  Service: Endoscopy;  Laterality: N/A;   PROSTATE SURGERY     SKIN CANCER EXCISION     TONSILLECTOMY Bilateral    TOOTH EXTRACTION     front left tooth pulled but no surgery or stitches   TRANSURETHRAL RESECTION OF PROSTATE N/A 01/25/2017   Procedure: TRANSURETHRAL RESECTION OF THE PROSTATE (TURP);  Surgeon: Sebastian Ache, MD;  Location: [facility];  Service: Urology;  Laterality: N/A;   TRANSURETHRAL RESECTION OF PROSTATE N/A 11/10/2019   Procedure: TRANSURETHRAL RESECTION OF THE PROSTATE (TURP);  Surgeon: Bjorn Pippin, MD;  Location: [facility];  Service: Urology;  Laterality: N/A;   WISDOM TOOTH EXTRACTION      Social History: Social History   Socioeconomic History   Marital status: Married    Spouse name: Not on file   Number of children: 1   Years of education: Not on file   Highest education level: Not on file  Occupational History    Comment: working full time  Tobacco Use   Smoking status: Former    Packs/day: 1.50    Years: 35.00    Additional pack years: 0.00    Total pack years: 52.50    Types: Cigarettes    Quit date: 01/12/2004    Years since quitting: 18.5   Smokeless tobacco: Never   Tobacco comments:    OVER 30 YEARS HX OF SMOKING   Vaping Use   Vaping Use: Never used  Substance and Sexual Activity   Alcohol use: Not Currently    Comment: heavy drinker until 2004   Drug use: Not Currently    Types: Marijuana   Sexual activity: Not Currently  Other Topics Concern   Not on file  Social History Narrative   Married for  19 years.Works for [employer] from home.   Social Determinants of Health   Financial Resource Strain: Low Risk  (03/02/2020)   Overall Financial Resource Strain (CARDIA)    Difficulty of Paying Living Expenses: Not hard at all  Food Insecurity: No Food Insecurity (03/02/2020)   Hunger Vital Sign    Worried About Running Out of Food in the Last Year: Never true    Ran Out of Food in the Last Year: Never true  Transportation Needs: No Transportation Needs (03/02/2020)   PRAPARE - Administrator, Civil Service (  Medical): No    Lack of Transportation (Non-Medical): No  Physical Activity: Inactive (03/02/2020)   Exercise Vital Sign    Days of Exercise per Week: 0 days    Minutes of Exercise per Session: 0 min  Stress: No Stress Concern Present (03/02/2020)   [employer] of Occupational Health - Occupational Stress Questionnaire    Feeling of Stress : Only a little  Social Connections: Moderately Integrated (03/02/2020)   Social Connection and Isolation Panel [NHANES]    Frequency of Communication with Friends and Family: Three times a week    Frequency of Social Gatherings with Friends and Family: Never    Attends Religious Services: Never    Database administrator or Organizations: Yes    Attends Banker Meetings: 1 to 4 times per year    Marital Status: Married  Catering manager Violence: Not At Risk (03/02/2020)   Humiliation, Afraid, Rape, and Kick questionnaire    Fear of Current or Ex-Partner: No    Emotionally Abused: No    Physically Abused: No    Sexually Abused: No    Family History: Family History  Problem Relation Age of Onset   Emphysema Mother    Alzheimer's disease Father    Prostate cancer Father        dx in his 72s   Prostate cancer Brother        dx in his 31s   Breast cancer Maternal Grandmother        60s   Pancreatic cancer Maternal Grandfather        66s   Dementia Paternal Grandmother    Heart attack Paternal  Grandfather    Colon cancer Neg Hx     Current Medications:  Current Outpatient Medications:    arginine 500 MG tablet, Take 500 mg by mouth 2 (two) times daily., Disp: , Rfl:    Cholecalciferol 125 MCG (5000 UT) capsule, Take 5,000-10,000 Units by mouth See admin instructions. Take 16109 units in the morning and 5000 units at night, Disp: , Rfl:    DANDELION ROOT PO, Take 500 mg by mouth daily., Disp: , Rfl:    doxycycline (VIBRAMYCIN) 100 MG capsule, Take 2 capsules by mouth as a single dose., Disp: 2 capsule, Rfl: 0   dutasteride (AVODART) 0.5 MG capsule, Take 1 capsule (0.5 mg total) by mouth daily., Disp: 90 capsule, Rfl: 3   estradiol (VIVELLE-DOT) 0.1 MG/24HR patch, APPLY 1 PATCH(0.1 MG) TOPICALLY TO THE SKIN 2 TIMES A WEEK, Disp: 8 patch, Rfl: 12   HYDROcodone-acetaminophen (NORCO) 5-325 MG tablet, Take 1 tablet by mouth every 8 (eight) hours as needed for moderate pain., Disp: 30 tablet, Rfl: 0   ibuprofen (ADVIL) 200 MG tablet, Take 400 mg by mouth every 6 (six) hours as needed for moderate pain., Disp: , Rfl:    MAGNESIUM PO, Take 1 tablet by mouth at bedtime., Disp: , Rfl:    MELATONIN PO, Take 40 mg by mouth at bedtime., Disp: , Rfl:    MILK THISTLE PO, Take 1 capsule by mouth daily., Disp: , Rfl:    NATTOKINASE PO, Take 2 capsules by mouth daily., Disp: , Rfl:    NP THYROID 120 MG tablet, Take 0.5 tablets (60 mg total) by mouth every morning., Disp: 45 tablet, Rfl: 1   olmesartan (BENICAR) 20 MG tablet, TAKE 1 TABLET(20 MG) BY MOUTH DAILY, Disp: 90 tablet, Rfl: 1   ondansetron (ZOFRAN ODT) 8 MG disintegrating tablet, Take 1 tablet (8 mg total)  by mouth every 8 (eight) hours as needed for nausea or vomiting., Disp: 60 tablet, Rfl: 3   predniSONE (DELTASONE) 10 MG tablet, Take 1 tablet (10 mg total) by mouth daily., Disp: 30 tablet, Rfl: 3   predniSONE (DELTASONE) 5 MG tablet, Take 1 tablet (5 mg total) by mouth daily with breakfast., Disp: 30 tablet, Rfl: 5   prochlorperazine  (COMPAZINE) 10 MG tablet, Take 1 tablet (10 mg total) by mouth every 6 (six) hours as needed for nausea or vomiting., Disp: 30 tablet, Rfl: 3   prochlorperazine (COMPAZINE) 10 MG tablet, Take 1 tablet (10 mg total) by mouth every 6 (six) hours as needed for nausea or vomiting., Disp: 60 tablet, Rfl: 1   pyridOXINE (VITAMIN B6) 25 MG tablet, Take 25 mg by mouth daily., Disp: , Rfl:    QUERCETIN PO, Take 1 tablet by mouth 2 (two) times daily., Disp: , Rfl:    thiamine (VITAMIN B-1) 50 MG tablet, Take 1 tablet (50 mg total) by mouth daily., Disp: 90 tablet, Rfl: 1   traMADol (ULTRAM) 50 MG tablet, Take 1 tablet (50 mg total) by mouth 2 (two) times daily as needed., Disp: 60 tablet, Rfl: 0   VITAMIN K PO, Take 1 capsule by mouth every other day., Disp: , Rfl:    Zinc 30 MG CAPS, Take 30 mg by mouth daily., Disp: , Rfl:    Allergies: Allergies  Allergen Reactions   Crestor [Rosuvastatin] Other (See Comments)    myalgia   Ivp Dye [Iodinated Contrast Media] Swelling    Swelling on head, took Benadryl and swelling reduced   Wellbutrin [Bupropion] Hives    REVIEW OF SYSTEMS:   Review of Systems  Constitutional:  Negative for chills, fatigue and fever.  HENT:   Negative for lump/mass, mouth sores, nosebleeds, sore throat and trouble swallowing.   Eyes:  Negative for eye problems.  Respiratory:  Positive for shortness of breath. Negative for cough.   Cardiovascular:  Negative for chest pain, leg swelling and palpitations.  Gastrointestinal:  Negative for abdominal pain, constipation, diarrhea, nausea and vomiting.  Genitourinary:  Negative for bladder incontinence, difficulty urinating, dysuria, frequency, hematuria and nocturia.   Musculoskeletal:  Negative for arthralgias, back pain, flank pain, myalgias and neck pain.  Skin:  Negative for itching and rash.  Neurological:  Positive for numbness. Negative for dizziness and headaches.  Hematological:  Does not bruise/bleed easily.   Psychiatric/Behavioral:  Negative for depression, sleep disturbance and suicidal ideas. The patient is not nervous/anxious.   All other systems reviewed and are negative.    VITALS:   Blood pressure 111/73, pulse 60, temperature 98.1 F (36.7 C), temperature source Oral, resp. rate 18, height 5\' 6"  (1.676 m), weight 174 lb (78.9 kg), SpO2 100 %.  Wt Readings from Last 3 Encounters:  07/11/22 174 lb (78.9 kg)  06/20/22 173 lb 8 oz (78.7 kg)  06/14/22 172 lb 4.8 oz (78.2 kg)    Body mass index is 28.08 kg/m.  Performance status (ECOG): 1 - Symptomatic but completely ambulatory  PHYSICAL EXAM:   Physical Exam Vitals and nursing note reviewed. Exam conducted with a chaperone present.  Constitutional:      Appearance: Normal appearance.  Cardiovascular:     Rate and Rhythm: Normal rate and regular rhythm.     Pulses: Normal pulses.     Heart sounds: Normal heart sounds.  Pulmonary:     Effort: Pulmonary effort is normal.     Breath sounds: Normal breath sounds.  Abdominal:     Palpations: Abdomen is soft. There is no hepatomegaly, splenomegaly or mass.     Tenderness: There is no abdominal tenderness.  Musculoskeletal:     Right lower leg: No edema.     Left lower leg: No edema.  Lymphadenopathy:     Cervical: No cervical adenopathy.     Right cervical: No superficial, deep or posterior cervical adenopathy.    Left cervical: No superficial, deep or posterior cervical adenopathy.     Upper Body:     Right upper body: No supraclavicular or axillary adenopathy.     Left upper body: No supraclavicular or axillary adenopathy.  Neurological:     General: No focal deficit present.     Mental Status: He is alert and oriented to person, place, and time.  Psychiatric:        Mood and Affect: Mood normal.        Behavior: Behavior normal.     LABS:      Latest Ref Rng & Units 07/11/2022    9:31 AM 06/20/2022   11:32 AM 05/30/2022    9:30 AM  CBC  WBC 4.0 - 10.5 K/uL 4.8   7.1  5.3   Hemoglobin 13.0 - 17.0 g/dL 16.1  09.6  04.5   Hematocrit 39.0 - 52.0 % 33.6  32.0  33.7   Platelets 150 - 400 K/uL 299  213  307       Latest Ref Rng & Units 07/11/2022    9:31 AM 06/20/2022   11:32 AM 05/30/2022    9:30 AM  CMP  Glucose 70 - 99 mg/dL 409  811  914   BUN 8 - 23 mg/dL Creatinine 0.61 - 1.24 mg/dL 7.82  9.56  2.13   Sodium 135 - 145 mmol/L 133  133  135   Potassium 3.5 - 5.1 mmol/L 3.9  4.0  3.9   Chloride 98 - 111 mmol/L 101  103  103   CO2 22 - 32 mmol/L Calcium 8.9 - 10.3 mg/dL 9.3  9.0  9.1   Total Protein 6.5 - 8.1 g/dL 6.7  6.3  6.6   Total Bilirubin 0.3 - 1.2 mg/dL 0.9  0.7  0.7   Alkaline Phos 38 - 126 U/L 88  82  74   AST 15 - 41 U/L ALT 0 - 44 U/L No results found for: "CEA1", "CEA" / No results found for: "CEA1", "CEA" Lab Results  Component Value Date   PSA1 12.9 (H) 05/26/2020   No results found for: "YQM578" No results found for: "CAN125"  No results found for: "TOTALPROTELP", "ALBUMINELP", "A1GS", "A2GS", "BETS", "BETA2SER", "GAMS", "MSPIKE", "SPEI" Lab Results  Component Value Date   TIBC 336.0 01/09/2021   TIBC 280 04/20/2020   FERRITIN 69.8 01/09/2021   FERRITIN 45 04/20/2020   IRONPCTSAT 45.2 01/09/2021   IRONPCTSAT 31 04/20/2020   No results found for: "LDH"   STUDIES:   No results found.

## 2022-07-11 ENCOUNTER — Inpatient Hospital Stay (HOSPITAL_BASED_OUTPATIENT_CLINIC_OR_DEPARTMENT_OTHER): Payer: BC Managed Care – PPO | Admitting: Hematology

## 2022-07-11 ENCOUNTER — Inpatient Hospital Stay: Payer: BC Managed Care – PPO

## 2022-07-11 VITALS — BP 111/73 | HR 60 | Temp 98.1°F | Resp 18 | Ht 66.0 in | Wt 174.0 lb

## 2022-07-11 VITALS — BP 119/73 | HR 49 | Temp 98.4°F | Resp 16

## 2022-07-11 DIAGNOSIS — C61 Malignant neoplasm of prostate: Secondary | ICD-10-CM

## 2022-07-11 LAB — CBC WITH DIFFERENTIAL/PLATELET
Abs Immature Granulocytes: 0.04 10*3/uL (ref 0.00–0.07)
Basophils Absolute: 0.1 10*3/uL (ref 0.0–0.1)
Basophils Relative: 2 %
Eosinophils Absolute: 0.1 10*3/uL (ref 0.0–0.5)
Eosinophils Relative: 2 %
HCT: 33.6 % — ABNORMAL LOW (ref 39.0–52.0)
Hemoglobin: 11.2 g/dL — ABNORMAL LOW (ref 13.0–17.0)
Immature Granulocytes: 1 %
Lymphocytes Relative: 20 %
Lymphs Abs: 1 10*3/uL (ref 0.7–4.0)
MCH: 32.7 pg (ref 26.0–34.0)
MCHC: 33.3 g/dL (ref 30.0–36.0)
MCV: 98 fL (ref 80.0–100.0)
Monocytes Absolute: 0.6 10*3/uL (ref 0.1–1.0)
Monocytes Relative: 13 %
Neutro Abs: 3 10*3/uL (ref 1.7–7.7)
Neutrophils Relative %: 62 %
Platelets: 299 10*3/uL (ref 150–400)
RBC: 3.43 MIL/uL — ABNORMAL LOW (ref 4.22–5.81)
RDW: 14.2 % (ref 11.5–15.5)
WBC: 4.8 10*3/uL (ref 4.0–10.5)
nRBC: 0 % (ref 0.0–0.2)

## 2022-07-11 LAB — MAGNESIUM: Magnesium: 2.3 mg/dL (ref 1.7–2.4)

## 2022-07-11 LAB — COMPREHENSIVE METABOLIC PANEL
ALT: 20 U/L (ref 0–44)
AST: 19 U/L (ref 15–41)
Albumin: 3.8 g/dL (ref 3.5–5.0)
Alkaline Phosphatase: 88 U/L (ref 38–126)
Anion gap: 7 (ref 5–15)
BUN: 20 mg/dL (ref 8–23)
CO2: 25 mmol/L (ref 22–32)
Calcium: 9.3 mg/dL (ref 8.9–10.3)
Chloride: 101 mmol/L (ref 98–111)
Creatinine, Ser: 0.78 mg/dL (ref 0.61–1.24)
GFR, Estimated: >60 mL/min (ref >60)
Glucose, Bld: 116 mg/dL — ABNORMAL HIGH (ref 70–99)
Potassium: 3.9 mmol/L (ref 3.5–5.1)
Sodium: 133 mmol/L — ABNORMAL LOW (ref 135–145)
Total Bilirubin: 0.9 mg/dL (ref 0.3–1.2)
Total Protein: 6.7 g/dL (ref 6.5–8.1)

## 2022-07-11 LAB — PSA: Prostatic Specific Antigen: 44.42 ng/mL — ABNORMAL HIGH (ref 0.00–4.00)

## 2022-07-11 MED ORDER — TRAMADOL HCL 50 MG PO TABS: 50.0000 mg | ORAL_TABLET | Freq: Two times a day (BID) | ORAL | 0 refills | Status: DC | PRN

## 2022-07-11 MED ORDER — CETIRIZINE HCL 10 MG/ML IV SOLN
10.0000 mg | Freq: Once | INTRAVENOUS | Status: AC
  Administered 2022-07-11: 10 mg via INTRAVENOUS
  Filled 2022-07-11: qty 1

## 2022-07-11 MED ORDER — FAMOTIDINE IN NACL 20-0.9 MG/50ML-% IV SOLN
20.0000 mg | Freq: Once | INTRAVENOUS | Status: AC
  Administered 2022-07-11: 20 mg via INTRAVENOUS
  Filled 2022-07-11: qty 50

## 2022-07-11 MED ORDER — PALONOSETRON HCL INJECTION 0.25 MG/5ML
0.2500 mg | Freq: Once | INTRAVENOUS | Status: AC
  Administered 2022-07-11: 0.25 mg via INTRAVENOUS
  Filled 2022-07-11: qty 5

## 2022-07-11 MED ORDER — SODIUM CHLORIDE 0.9 % IV SOLN
10.0000 mg | Freq: Once | INTRAVENOUS | Status: AC
  Administered 2022-07-11: 10 mg via INTRAVENOUS
  Filled 2022-07-11: qty 10

## 2022-07-11 MED ORDER — SODIUM CHLORIDE 0.9 % IV SOLN
20.0000 mg/m2 | Freq: Once | INTRAVENOUS | Status: AC
  Administered 2022-07-11: 38 mg via INTRAVENOUS
  Filled 2022-07-11: qty 3.8

## 2022-07-11 MED ORDER — SODIUM CHLORIDE 0.9 % IV SOLN: Freq: Once | INTRAVENOUS | Status: AC

## 2022-07-11 MED ORDER — SODIUM CHLORIDE 0.9 % IV SOLN
150.0000 mg | Freq: Once | INTRAVENOUS | Status: AC
  Administered 2022-07-11: 150 mg via INTRAVENOUS
  Filled 2022-07-11: qty 150

## 2022-07-11 NOTE — Progress Notes (Signed)
Patient has been examined by Dr. Katragadda. Vital signs and labs have been reviewed by MD - ANC, Creatinine, LFTs, hemoglobin, and platelets are within treatment parameters per M.D. - pt may proceed with treatment.  Primary RN and pharmacy notified.  

## 2022-07-11 NOTE — Progress Notes (Signed)
Patient presents today for [facility] and follow up visit with Dr. Ellin Saba. Labs and vital signs within parameters for treatment. Pre-treatment checklist complete. MAR reviewed.   Treatment given today per MD orders. Tolerated infusion without adverse affects. Vital signs stable. No complaints at this time. Discharged from clinic ambulatory in stable condition. Alert and oriented x 3. F/U with [facility] as scheduled.

## 2022-07-11 NOTE — Patient Instructions (Signed)
[facility] Discharge Instructions   You were seen and examined today by Dr. Katragadda.  He reviewed the results of your lab work which are normal/stable.   We will proceed with your treatment today.  Return as scheduled.    Thank you for choosing [facility] to provide your oncology and hematology care.  To afford each patient quality time with our provider, please arrive at least 15 minutes before your scheduled appointment time.   If you have a lab appointment with the Cancer Center please come in thru the Main Entrance and check in at the main information desk.  You need to re-schedule your appointment should you arrive 10 or more minutes late.  We strive to give you quality time with our providers, and arriving late affects you and other patients whose appointments are after yours.  Also, if you no show three or more times for appointments you may be dismissed from the clinic at the providers discretion.     Again, thank you for choosing [facility].  Our hope is that these requests will decrease the amount of time that you wait before being seen by our physicians.       _____________________________________________________________  Should you have questions after your visit to [facility], please contact our office at [phone] and follow the prompts.  Our office hours are 8:00 a.m. and 4:30 p.m. Monday - Friday.  Please note that voicemails left after 4:00 p.m. may not be returned until the following business day.  We are closed weekends and major holidays.  You do have access to a nurse 24-7, just call the main number to the clinic [phone] and do not press any options, hold on the line and a nurse will answer the phone.    For prescription refill requests, have your pharmacy contact our office and allow 72 hours.    Due to Covid, you will need to wear a mask upon entering  the hospital. If you do not have a mask, a mask will be given to you at the Main Entrance upon arrival. For doctor visits, patients may have 1 support person age 18 or older with them. For treatment visits, patients can not have anyone with them due to social distancing guidelines and our immunocompromised population.      

## 2022-07-11 NOTE — Patient Instructions (Addendum)
[facility] [place]  Discharge Instructions: Thank you for choosing [facility] to provide your oncology and hematology care.  If you have a lab appointment with the Cancer Center - please note that after April 8th, 2024, all labs will be drawn in the cancer center.  You do not have to check in or register with the main entrance as you have in the past but will complete your check-in in the cancer center.  Wear comfortable clothing and clothing appropriate for easy access to any Portacath or PICC line.   We strive to give you quality time with your provider. You may need to reschedule your appointment if you arrive late (15 or more minutes).  Arriving late affects you and other patients whose appointments are after yours.  Also, if you miss three or more appointments without notifying the office, you may be dismissed from the clinic at the provider's discretion.      For prescription refill requests, have your pharmacy contact our office and allow 72 hours for refills to be completed.    Today you received the following chemotherapy and/or immunotherapy agents Jevtana.Cabazitaxel Injection What is this medication? CABAZITAXEL (ka BAZ i TAX el) treats prostate cancer. It works by slowing down the growth of cancer cells. This medicine may be used for other purposes; ask your health care provider or pharmacist if you have questions. COMMON BRAND NAME(S): Jevtana What should I tell my care team before I take this medication? They need to know if you have any of these conditions: Kidney problems Liver disease Low white blood cell levels Lung disease Stomach or intestine problems An unusual or allergic reaction to cabazitaxel, polysorbate 80, other medications, foods, dyes, or preservatives Pregnant or trying to get pregnant Breast-feeding How should I use this medication? This medication is injected into a vein. It is given by your care team in a hospital or clinic  setting. Talk to your care team about the use of this medication in children. Special care may be needed. Overdosage: If you think you have taken too much of this medicine contact a poison control center or emergency room at once. NOTE: This medicine is only for you. Do not share this medicine with others. What if I miss a dose? Keep appointments for follow-up doses. It is important not to miss your dose. Call your care team if you are unable to keep an appointment. What may interact with this medication? Certain antibiotics, such as clarithromycin or telithromycin Certain antivirals for HIV or AIDS Certain medications for fungal infections like ketoconazole, itraconazole, and voriconazole Nefazodone This list may not describe all possible interactions. Give your health care provider a list of all the medicines, herbs, non-prescription drugs, or dietary supplements you use. Also tell them if you smoke, drink alcohol, or use illegal drugs. Some items may interact with your medicine. What should I watch for while using this medication? This medication may make you feel generally unwell. This is not uncommon as chemotherapy can affect healthy cells as well as cancer cells. Report any side effects. Continue your course of treatment even though you feel ill unless your care team tells you to stop. You may need blood work while you are taking this medication. This medication may increase your risk of getting an infection. Call your care team for advice if you get a fever, chills, sore throat, or other symptoms of a cold or flu. Do not treat yourself. Try to avoid being around people who  are sick. Avoid taking medications that contain aspirin, acetaminophen, ibuprofen, naproxen, or ketoprofen unless instructed by your care team. These medications may hide a fever. Be careful brushing or flossing your teeth or using a toothpick because you may get an infection or bleed more easily. If you have any dental  work done, tell your dentist you are receiving this medication. This medication can cause serious infusion reactions. To reduce the risk, your care team may give you other medications to take before receiving this one. Be sure to follow the directions from your care team. Use a condom during sex while taking this medication and for 4 months after the last dose. Talk to your care team right away if your partner may be pregnant. This medication can cause serious birth defects. This medication may cause infertility. Talk to your care team if you are concerned about your fertility. What side effects may I notice from receiving this medication? Side effects that you should report to your care team as soon as possible: Allergic reactions--skin rash, itching, hives, swelling of the face, lips, tongue, or throat Diarrhea, nausea, vomiting Dry cough, shortness of breath or trouble breathing Infection--fever, chills, cough, or sore throat Kidney injury--decrease in the amount of urine, swelling of the ankles, hands, or feet Pain, tingling, or numbness in the hands or feet Red or dark brown urine Stomach bleeding--bloody or black, tar-like stools, vomiting blood or brown material that looks like coffee grounds Stomach pain that is severe, does not away, or gets worse Unusual bruising or bleeding Side effects that usually do not require medical attention (report these to your care team if they continue or are bothersome): Loss of appetite Unusual weakness or fatigue This list may not describe all possible side effects. Call your doctor for medical advice about side effects. You may report side effects to FDA at 1-800-FDA-1088. Where should I keep my medication? This medication is given in a hospital or clinic. It will not be stored at home. NOTE: This sheet is a summary. It may not cover all possible information. If you have questions about this medicine, talk to your doctor, pharmacist, or health care  provider.  2023 Elsevier/Gold Standard (2008-09-15 00:00:00)    BELOW ARE SYMPTOMS THAT SHOULD BE REPORTED IMMEDIATELY: *FEVER GREATER THAN 100.4 F (38 C) OR HIGHER *CHILLS OR SWEATING *NAUSEA AND VOMITING THAT IS NOT CONTROLLED WITH YOUR NAUSEA MEDICATION *UNUSUAL SHORTNESS OF BREATH *UNUSUAL BRUISING OR BLEEDING *URINARY PROBLEMS (pain or burning when urinating, or frequent urination) *BOWEL PROBLEMS (unusual diarrhea, constipation, pain near the anus) TENDERNESS IN MOUTH AND THROAT WITH OR WITHOUT PRESENCE OF ULCERS (sore throat, sores in mouth, or a toothache) UNUSUAL RASH, SWELLING OR PAIN  UNUSUAL VAGINAL DISCHARGE OR ITCHING   Items with * indicate a potential emergency and should be followed up as soon as possible or go to the Emergency Department if any problems should occur.  Please show the CHEMOTHERAPY ALERT CARD or IMMUNOTHERAPY ALERT CARD at check-in to the Emergency Department and triage nurse.  Should you have questions after your visit or need to cancel or reschedule your appointment, please contact [facility] AT [facility] [phone]  and follow the prompts.  Office hours are 8:00 a.m. to 4:30 p.m. Monday - Friday. Please note that voicemails left after 4:00 p.m. may not be returned until the following business day.  We are closed weekends and major holidays. You have access to a nurse at all times for urgent questions. Please call the main number  to the clinic [phone] and follow the prompts.  For any non-urgent questions, you may also contact your provider using MyChart. We now offer e-Visits for anyone 12 and older to request care online for non-urgent symptoms. For details visit [email or web address].   Also download the MyChart app! Go to the app store, search "MyChart", open the app, select [place], and log in with your MyChart username and password.  Cabazitaxel Injection What is this medication? CABAZITAXEL (ka BAZ i TAX el) treats prostate  cancer. It works by slowing down the growth of cancer cells. This medicine may be used for other purposes; ask your health care provider or pharmacist if you have questions. COMMON BRAND NAME(S): Jevtana What should I tell my care team before I take this medication? They need to know if you have any of these conditions: Kidney problems Liver disease Low white blood cell levels Lung disease Stomach or intestine problems An unusual or allergic reaction to cabazitaxel, polysorbate 80, other medications, foods, dyes, or preservatives Pregnant or trying to get pregnant Breast-feeding How should I use this medication? This medication is injected into a vein. It is given by your care team in a hospital or clinic setting. Talk to your care team about the use of this medication in children. Special care may be needed. Overdosage: If you think you have taken too much of this medicine contact a poison control center or emergency room at once. NOTE: This medicine is only for you. Do not share this medicine with others. What if I miss a dose? Keep appointments for follow-up doses. It is important not to miss your dose. Call your care team if you are unable to keep an appointment. What may interact with this medication? Certain antibiotics, such as clarithromycin or telithromycin Certain antivirals for HIV or AIDS Certain medications for fungal infections like ketoconazole, itraconazole, and voriconazole Nefazodone This list may not describe all possible interactions. Give your health care provider a list of all the medicines, herbs, non-prescription drugs, or dietary supplements you use. Also tell them if you smoke, drink alcohol, or use illegal drugs. Some items may interact with your medicine. What should I watch for while using this medication? This medication may make you feel generally unwell. This is not uncommon as chemotherapy can affect healthy cells as well as cancer cells. Report any side  effects. Continue your course of treatment even though you feel ill unless your care team tells you to stop. You may need blood work while you are taking this medication. This medication may increase your risk of getting an infection. Call your care team for advice if you get a fever, chills, sore throat, or other symptoms of a cold or flu. Do not treat yourself. Try to avoid being around people who are sick. Avoid taking medications that contain aspirin, acetaminophen, ibuprofen, naproxen, or ketoprofen unless instructed by your care team. These medications may hide a fever. Be careful brushing or flossing your teeth or using a toothpick because you may get an infection or bleed more easily. If you have any dental work done, tell your dentist you are receiving this medication. This medication can cause serious infusion reactions. To reduce the risk, your care team may give you other medications to take before receiving this one. Be sure to follow the directions from your care team. Use a condom during sex while taking this medication and for 4 months after the last dose. Talk to your care team right away if your partner may  be pregnant. This medication can cause serious birth defects. This medication may cause infertility. Talk to your care team if you are concerned about your fertility. What side effects may I notice from receiving this medication? Side effects that you should report to your care team as soon as possible: Allergic reactions--skin rash, itching, hives, swelling of the face, lips, tongue, or throat Diarrhea, nausea, vomiting Dry cough, shortness of breath or trouble breathing Infection--fever, chills, cough, or sore throat Kidney injury--decrease in the amount of urine, swelling of the ankles, hands, or feet Pain, tingling, or numbness in the hands or feet Red or dark brown urine Stomach bleeding--bloody or black, tar-like stools, vomiting blood or brown material that looks like  coffee grounds Stomach pain that is severe, does not away, or gets worse Unusual bruising or bleeding Side effects that usually do not require medical attention (report these to your care team if they continue or are bothersome): Loss of appetite Unusual weakness or fatigue This list may not describe all possible side effects. Call your doctor for medical advice about side effects. You may report side effects to FDA at 1-800-FDA-1088. Where should I keep my medication? This medication is given in a hospital or clinic. It will not be stored at home. NOTE: This sheet is a summary. It may not cover all possible information. If you have questions about this medicine, talk to your doctor, pharmacist, or health care provider.  2023 Elsevier/Gold Standard (2008-09-15 00:00:00) [facility] [place]  Discharge Instructions: Thank you for choosing [facility] to provide your oncology and hematology care.  If you have a lab appointment with the Cancer Center - please note that after April 8th, 2024, all labs will be drawn in the cancer center.  You do not have to check in or register with the main entrance as you have in the past but will complete your check-in in the cancer center.  Wear comfortable clothing and clothing appropriate for easy access to any Portacath or PICC line.   We strive to give you quality time with your provider. You may need to reschedule your appointment if you arrive late (15 or more minutes).  Arriving late affects you and other patients whose appointments are after yours.  Also, if you miss three or more appointments without notifying the office, you may be dismissed from the clinic at the provider's discretion.      For prescription refill requests, have your pharmacy contact our office and allow 72 hours for refills to be completed.    Today you received the following chemotherapy and/or immunotherapy agents Jevtana       To help prevent nausea  and vomiting after your treatment, we encourage you to take your nausea medication as directed.  BELOW ARE SYMPTOMS THAT SHOULD BE REPORTED IMMEDIATELY: *FEVER GREATER THAN 100.4 F (38 C) OR HIGHER *CHILLS OR SWEATING *NAUSEA AND VOMITING THAT IS NOT CONTROLLED WITH YOUR NAUSEA MEDICATION *UNUSUAL SHORTNESS OF BREATH *UNUSUAL BRUISING OR BLEEDING *URINARY PROBLEMS (pain or burning when urinating, or frequent urination) *BOWEL PROBLEMS (unusual diarrhea, constipation, pain near the anus) TENDERNESS IN MOUTH AND THROAT WITH OR WITHOUT PRESENCE OF ULCERS (sore throat, sores in mouth, or a toothache) UNUSUAL RASH, SWELLING OR PAIN  UNUSUAL VAGINAL DISCHARGE OR ITCHING   Items with * indicate a potential emergency and should be followed up as soon as possible or go to the Emergency Department if any problems should occur.  Please show the CHEMOTHERAPY ALERT CARD  or IMMUNOTHERAPY ALERT CARD at check-in to the Emergency Department and triage nurse.  Should you have questions after your visit or need to cancel or reschedule your appointment, please contact [facility] CENTER AT [facility] [phone]  and follow the prompts.  Office hours are 8:00 a.m. to 4:30 p.m. Monday - Friday. Please note that voicemails left after 4:00 p.m. may not be returned until the following business day.  We are closed weekends and major holidays. You have access to a nurse at all times for urgent questions. Please call the main number to the clinic [phone] and follow the prompts.  For any non-urgent questions, you may also contact your provider using MyChart. We now offer e-Visits for anyone 71 and older to request care online for non-urgent symptoms. For details visit [email or web address].   Also download the MyChart app! Go to the app store, search "MyChart", open the app, select , and log in with your MyChart username and password.

## 2022-07-12 ENCOUNTER — Other Ambulatory Visit: Payer: Self-pay

## 2022-07-12 DIAGNOSIS — C61 Malignant neoplasm of prostate: Secondary | ICD-10-CM

## 2022-07-12 NOTE — Progress Notes (Signed)
Message received from Dr. Ellin Saba- Please order CTCAP and bone scan prior to next visit in 3 weeks. Please also call and let him know that I have ordered them as his PSA went up slightly from yesterday. Thanks.   CT CAP and bone scan ordered per Dr. Marice Potter orders. Patient is aware and agreeable with plan. Schedulers up front to schedule appointments.

## 2022-07-13 ENCOUNTER — Inpatient Hospital Stay: Payer: BC Managed Care – PPO

## 2022-07-13 VITALS — BP 124/64 | HR 63 | Temp 96.4°F | Resp 18

## 2022-07-13 DIAGNOSIS — C61 Malignant neoplasm of prostate: Secondary | ICD-10-CM

## 2022-07-13 MED ORDER — PEGFILGRASTIM-CBQV 6 MG/0.6ML ~~LOC~~ SOSY
6.0000 mg | PREFILLED_SYRINGE | Freq: Once | SUBCUTANEOUS | Status: AC
  Administered 2022-07-13: 6 mg via SUBCUTANEOUS
  Filled 2022-07-13: qty 0.6

## 2022-07-13 NOTE — Patient Instructions (Signed)
[facility] [place]  Discharge Instructions: Thank you for choosing [facility] to provide your oncology and hematology care.  If you have a lab appointment with the Cancer Center - please note that after April 8th, 2024, all labs will be drawn in the cancer center.  You do not have to check in or register with the main entrance as you have in the past but will complete your check-in in the cancer center.  Wear comfortable clothing and clothing appropriate for easy access to any Portacath or PICC line.   We strive to give you quality time with your provider. You may need to reschedule your appointment if you arrive late (15 or more minutes).  Arriving late affects you and other patients whose appointments are after yours.  Also, if you miss three or more appointments without notifying the office, you may be dismissed from the clinic at the provider's discretion.      For prescription refill requests, have your pharmacy contact our office and allow 72 hours for refills to be completed.    Today you received the following, udenyca injection   To help prevent nausea and vomiting after your treatment, we encourage you to take your nausea medication as directed.  BELOW ARE SYMPTOMS THAT SHOULD BE REPORTED IMMEDIATELY: *FEVER GREATER THAN 100.4 F (38 C) OR HIGHER *CHILLS OR SWEATING *NAUSEA AND VOMITING THAT IS NOT CONTROLLED WITH YOUR NAUSEA MEDICATION *UNUSUAL SHORTNESS OF BREATH *UNUSUAL BRUISING OR BLEEDING *URINARY PROBLEMS (pain or burning when urinating, or frequent urination) *BOWEL PROBLEMS (unusual diarrhea, constipation, pain near the anus) TENDERNESS IN MOUTH AND THROAT WITH OR WITHOUT PRESENCE OF ULCERS (sore throat, sores in mouth, or a toothache) UNUSUAL RASH, SWELLING OR PAIN  UNUSUAL VAGINAL DISCHARGE OR ITCHING   Items with * indicate a potential emergency and should be followed up as soon as possible or go to the Emergency Department if any problems  should occur.  Please show the CHEMOTHERAPY ALERT CARD or IMMUNOTHERAPY ALERT CARD at check-in to the Emergency Department and triage nurse.  Should you have questions after your visit or need to cancel or reschedule your appointment, please contact [facility] AT [facility] [phone]  and follow the prompts.  Office hours are 8:00 a.m. to 4:30 p.m. Monday - Friday. Please note that voicemails left after 4:00 p.m. may not be returned until the following business day.  We are closed weekends and major holidays. You have access to a nurse at all times for urgent questions. Please call the main number to the clinic [phone] and follow the prompts.  For any non-urgent questions, you may also contact your provider using MyChart. We now offer e-Visits for anyone 24 and older to request care online for non-urgent symptoms. For details visit [email or web address].   Also download the MyChart app! Go to the app store, search "MyChart", open the app, select [place], and log in with your MyChart username and password.

## 2022-07-13 NOTE — Progress Notes (Signed)
Udenyca injection given per orders.Patient tolerated it well without problems. Vitals stable and discharged home from clinic ambulatory. Follow up as scheduled.  

## 2022-07-23 ENCOUNTER — Other Ambulatory Visit: Payer: Self-pay | Admitting: Internal Medicine

## 2022-07-23 DIAGNOSIS — E039 Hypothyroidism, unspecified: Secondary | ICD-10-CM

## 2022-07-24 ENCOUNTER — Encounter (HOSPITAL_COMMUNITY): Payer: Self-pay | Admitting: Hematology

## 2022-07-24 ENCOUNTER — Encounter (HOSPITAL_COMMUNITY)
Admission: RE | Admit: 2022-07-24 | Discharge: 2022-07-24 | Disposition: A | Discharge status: Home / Self Care | Payer: BC Managed Care – PPO | Source: Ambulatory Visit | Attending: Hematology | Admitting: Hematology

## 2022-07-24 ENCOUNTER — Other Ambulatory Visit: Payer: Self-pay | Admitting: *Deleted

## 2022-07-24 DIAGNOSIS — C61 Malignant neoplasm of prostate: Secondary | ICD-10-CM | POA: Diagnosis present

## 2022-07-24 MED ORDER — PREDNISONE 50 MG PO TABS: ORAL_TABLET | ORAL | 0 refills | Status: DC

## 2022-07-24 MED ORDER — TECHNETIUM TC 99M MEDRONATE IV KIT
20.0000 | PACK | Freq: Once | INTRAVENOUS | Status: AC | PRN
  Administered 2022-07-24: 19.6 via INTRAVENOUS

## 2022-07-26 ENCOUNTER — Ambulatory Visit (HOSPITAL_COMMUNITY)
Admission: RE | Admit: 2022-07-26 | Discharge: 2022-07-26 | Disposition: A | Discharge status: Home / Self Care | Payer: BC Managed Care – PPO | Source: Ambulatory Visit | Attending: Hematology | Admitting: Hematology

## 2022-07-26 DIAGNOSIS — C61 Malignant neoplasm of prostate: Secondary | ICD-10-CM | POA: Diagnosis present

## 2022-07-26 MED ORDER — IOHEXOL 300 MG/ML  SOLN
100.0000 mL | Freq: Once | INTRAMUSCULAR | Status: AC | PRN
  Administered 2022-07-26: 100 mL via INTRAVENOUS

## 2022-07-31 NOTE — Progress Notes (Signed)
[facility] [address], [place] [address]    Clinic Day:  08/01/2022  Referring physician: Etta Grandchild, MD  Patient Care Team: Etta Grandchild, MD as PCP - General (Internal Medicine) Doreatha Massed, MD as Consulting Physician (Hematology and Oncology) Jena Gauss Gerrit Friends, MD as Consulting Physician (Gastroenterology) Therese Sarah, RN as Oncology Nurse Navigator (Medical Oncology)   ASSESSMENT & PLAN:   Assessment: 1.  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  Prominent left hydronephrosis and hydroureter noted.  New sclerotic lesion in the left anterior vertebral body of L1.  Mild left periaortic adenopathy increased from prior, short axis lymph node measuring 1 cm. -Last PSA increased to 15.8 on 09/28/2019. -As there is rapid progression of disease, I have recommended docetaxel chemotherapy.  He is agreeable after long discussion. -Cystoscopy with transurethral resection of tumor in the posterior bladder neck, right and left walls of the bladder, right lobe of the prostate on 11/10/2019. -Plan was to do 6 cycles of docetaxel followed by abiraterone and prednisone. -6 cycles of docetaxel from 11/16/2019 through 02/29/2020. -CTAP on 01/06/2020 showed persistent thickening of the urinary bladder wall.  Left hydronephrosis is significantly reduced compared to prior exam.  Left-sided retroperitoneal and left iliac lymph nodes reduced in size.  Interval increase in sclerosis of lesions of the left aspect of L2 vertebral body consistent with treatment response.  No new bone lesions. -Bone scan on 01/06/2020 shows stable L2 metastatic focus. -Bone scan scan images from 06/14/2020 which showed progressive metastatic disease, with increased size and intensity of L2 1 new focus of activity at  L1. 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 -Abiraterone and prednisone from 06/24/2020 through 09/06/2021 with progression. - PSMA PET scan (09/28/2021): Residual/recurrent tumor involving prostate gland and seminal vesicles.  Tracer avid bone metastasis localized L1 and L2 vertebral bodies.  No new sites of bone metastasis disease.  No solid organ or nodal mets. - Darolutamide from 10/17/2021 through 03/15/2022 with progression - Guardant360: MSI high not detected.  Androgen resistance present. - XRT to L2-L4, 30 Gray in 10 fractions completed on 04/23/2022 with improvement in pain. - Cabazitaxel cycle 1 on 05/07/2022.   2.  Left hydroureteronephrosis: -CT scan showed prostate mass invading the posterior bladder and obstructing the ureter.  Creatinine increased to 1.1. -Renal ultrasound on 12/11/2019 showed interval resolution of previously seen left-sided hydronephrosis.  Previously seen soft tissue mass at the posterior bladder lumen no longer seen.    Plan: 1.  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  He had more tiredness after cycle 4. - His PSA has jumped up to 44 on 07/11/2022.  Hence we have done imaging. - CT scan showed worsening bone mets but decreasing lung nodules and retroperitoneal adenopathy.  Bone scan showed multiple new sites of metastatic disease. - We will hold his treatment today.  We will wait on PSA from today. - I have recommended PSMA PET scan. - If PSA from today and PSMA PET scan are worse, will consider changing his treatment to Pluvicto.   2.  Hot flashes: - Continue estradiol patch twice weekly for hot flashes.   3.  Peripheral neuropathy: - Tingling in the left foot and fingertips is stable.   4.  Lower back pain/bilateral hip  pains: - Continue tramadol 50 mg at bedtime as needed.  Continue hydrocodone 5/325 for more intense pains.  5.  Osteoporosis (DEXA scan 02/24/2021 T-score -2.5): - Continue calcium and  vitamin D supplements.    Orders Placed This Encounter  Procedures   NM PET (PSMA) SKULL TO MID THIGH    Standing Status:   Future    Standing Expiration Date:   08/01/2023    Order Specific Question:   If indicated for the ordered procedure, I authorize the administration of a radiopharmaceutical per Radiology protocol    Answer:   Yes    Order Specific Question:   Preferred imaging location?    Answer:   [facility]      I,Katie Daubenspeck,acting as a Neurosurgeon for [employer], MD.,have documented all relevant documentation on the behalf of Doreatha Massed, MD,as directed by  Doreatha Massed, MD while in the presence of Doreatha Massed, MD.   I, Doreatha Massed MD, have reviewed the above documentation for accuracy and completeness, and I agree with the above.   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   Prior Therapy: 1. TURP on 11/10/2019. 2. Docetaxel x 6 cycles from 11/16/2019 to 02/29/2020. 3.  Abiraterone and prednisone 4.  Darolutamide  Current Therapy:  Cabazitaxel every 21 days    HISTORY OF PRESENT ILLNESS:   Oncology History  Prostate cancer metastatic to multiple sites [facility])  01/25/2017 Initial Diagnosis   Prostate cancer metastatic to multiple sites [facility])   11/16/2019 - 03/02/2020 Chemotherapy   Patient is on Treatment Plan : PROSTATE Docetaxel + Prednisone q21d      Genetic Testing   Negative genetic testing. No pathogenic variants identified on the [facility] CancerNext-Expanded panel. 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 The report date is 04/19/2020.  The CancerNext-Expanded  gene panel offered by [facility] and includes sequencing and rearrangement analysis for the following 77 genes: IP, ALK, APC*, ATM*, AXIN2, BAP1, BARD1, BLM, BMPR1A, BRCA1*, BRCA2*, BRIP1*,  CDC73, CDH1*,CDK4, CDKN1B, CDKN2A, CHEK2*, CTNNA1, DICER1, FANCC, FH, FLCN, GALNT12, KIF1B, LZTR1, MAX, MEN1, MET, MLH1*, MSH2*, MSH3, MSH6*, MUTYH*, NBN, NF1*, NF2, NTHL1, PALB2*, PHOX2B, PMS2*, POT1, PRKAR1A, PTCH1, PTEN*, RAD51C*, RAD51D*,RB1, RECQL, RET, SDHA, SDHAF2, SDHB, SDHC, SDHD, SMAD4, SMARCA4, SMARCB1, SMARCE1, STK11, SUFU, TMEM127, TP53*,TSC1, TSC2, VHL and XRCC2 (sequencing and deletion/duplication); EGFR, EGLN1, HOXB13, KIT, MITF, PDGFRA, POLD1 and POLE (sequencing only); EPCAM and GREM1 (deletion/duplication only).    05/07/2022 -  Chemotherapy   Patient is on Treatment Plan : PROSTATE Cabazitaxel (20) D1 + Prednisone D1-21 q21d        INTERVAL HISTORY:   Scott [name] is a 67 y.o. 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 He was last seen by me on 07/11/22.  Since his last visit, he underwent restaging bone scan on 07/24/22 showing: multiple new sites of abnormal tracer uptake; interval decrease in degree of tracer uptake at L2 and L4.  He also underwent restaging CT C/A/P on 07/26/22 showing: extensive osseous metastasis, including newly sclerotic lesions within left ischium and T11 vertebral body which could represent progressive osseous metastasis or healing of metastases; minimal decrease in bibasilar pulmonary nodules; decrease in isolated abdominal retroperitoneal borderline adenopathy; no soft tissue progression.  Today, he states that he is doing well overall. His appetite level is at 60%.  His energy level is at 40%.  PAST MEDICAL HISTORY:   Past Medical History: Past Medical History:  Diagnosis Date   Bladder obstruction    Complication of anesthesia    Diverticulitis 2015   Dyspnea    Essential hypertension, benign 01/26/2019   at doctor's office elevated, normal at home, no medications at this time   History of basal cell cancer    HLD (hyperlipidemia) 01/26/2019   Hypothyroidism    history of, not currently  taking medication   PONV (postoperative nausea and vomiting)    Prostate cancer (HCC)    metastatic to bones   Vitamin D deficiency disease 01/26/2019    Surgical History: Past Surgical History:  Procedure Laterality Date   CYSTOSCOPY W/ URETERAL STENT PLACEMENT Left 11/10/2019   Procedure: CYSTOSCOPY;  Surgeon: Bjorn Pippin, MD;  Location: [facility];  Service: Urology;  Laterality: Left;   ESOPHAGOGASTRODUODENOSCOPY (EGD) WITH PROPOFOL N/A 07/07/2020   Procedure: ESOPHAGOGASTRODUODENOSCOPY (EGD) WITH PROPOFOL;  Surgeon: Corbin Ade, MD;  Location: [facility];  Service: Endoscopy;  Laterality: N/A;  pm appt   HERNIA REPAIR  1995   UMBILICAL    IR FLUORO GUIDED NEEDLE PLC ASPIRATION/INJECTION LOC  04/09/2022   MALONEY DILATION N/A 07/07/2020   Procedure: MALONEY DILATION;  Surgeon: Corbin Ade, MD;  Location: [facility];  Service: Endoscopy;  Laterality: N/A;   PROSTATE SURGERY     SKIN CANCER EXCISION     TONSILLECTOMY Bilateral    TOOTH EXTRACTION     front left tooth pulled but no surgery or stitches   TRANSURETHRAL RESECTION OF PROSTATE N/A 01/25/2017   Procedure: TRANSURETHRAL RESECTION OF THE PROSTATE (TURP);  Surgeon: Sebastian Ache, MD;  Location: [facility];  Service: Urology;  Laterality: N/A;   TRANSURETHRAL RESECTION OF PROSTATE N/A 11/10/2019   Procedure: TRANSURETHRAL RESECTION OF THE PROSTATE (TURP);  Surgeon: Bjorn Pippin, MD;  Location: [facility];  Service: Urology;  Laterality: N/A;   WISDOM TOOTH EXTRACTION      Social History: Social History   Socioeconomic History   Marital status: Married    Spouse name: Not on file   Number of children: 1   Years of education: Not on file   Highest education level: Not on file  Occupational History    Comment: working full time  Tobacco Use   Smoking status: Former    Packs/day: 1.50    Years: 35.00    Additional pack years: 0.00    Total pack years: 52.50    Types: Cigarettes    Quit date: 01/12/2004    Years  since quitting: 18.5   Smokeless tobacco: Never   Tobacco comments:    OVER 30 YEARS HX OF SMOKING   Vaping Use   Vaping Use: Never used  Substance and Sexual Activity   Alcohol use: Not Currently    Comment: heavy drinker until 2004   Drug use: Not Currently    Types: Marijuana   Sexual activity: Not Currently  Other Topics Concern   Not on file  Social History Narrative   Married for 19 years.Works for [employer] from home.   Social Determinants of Health   Financial Resource Strain: Low Risk  (03/02/2020)   Overall Financial Resource Strain (CARDIA)    Difficulty of Paying Living Expenses: Not hard at all  Food Insecurity: No Food Insecurity (03/02/2020)   Hunger Vital Sign    Worried About Running Out of Food in the Last Year: Never true  Ran Out of Food in the Last Year: Never true  Transportation Needs: No Transportation Needs (03/02/2020)   PRAPARE - Administrator, Civil Service (Medical): No    Lack of Transportation (Non-Medical): No  Physical Activity: Inactive (03/02/2020)   Exercise Vital Sign    Days of Exercise per Week: 0 days    Minutes of Exercise per Session: 0 min  Stress: No Stress Concern Present (03/02/2020)   [employer] of Occupational Health - Occupational Stress Questionnaire    Feeling of Stress : Only a little  Social Connections: Moderately Integrated (03/02/2020)   Social Connection and Isolation Panel [NHANES]    Frequency of Communication with Friends and Family: Three times a week    Frequency of Social Gatherings with Friends and Family: Never    Attends Religious Services: Never    Database administrator or Organizations: Yes    Attends Banker Meetings: 1 to 4 times per year    Marital Status: Married  Catering manager Violence: Not At Risk (03/02/2020)   Humiliation, Afraid, Rape, and Kick questionnaire    Fear of Current or Ex-Partner: No    Emotionally Abused: No    Physically Abused: No     Sexually Abused: No    Family History: Family History  Problem Relation Age of Onset   Emphysema Mother    Alzheimer's disease Father    Prostate cancer Father        dx in his 53s   Prostate cancer Brother        dx in his 84s   Breast cancer Maternal Grandmother        60s   Pancreatic cancer Maternal Grandfather        70s   Dementia Paternal Grandmother    Heart attack Paternal Grandfather    Colon cancer Neg Hx     Current Medications:  Current Outpatient Medications:    arginine 500 MG tablet, Take 500 mg by mouth 2 (two) times daily., Disp: , Rfl:    Cholecalciferol 125 MCG (5000 UT) capsule, Take 5,000-10,000 Units by mouth See admin instructions. Take 14782 units in the morning and 5000 units at night, Disp: , Rfl:    DANDELION ROOT PO, Take 500 mg by mouth daily., Disp: , Rfl:    dutasteride (AVODART) 0.5 MG capsule, Take 1 capsule (0.5 mg total) by mouth daily., Disp: 90 capsule, Rfl: 3   estradiol (VIVELLE-DOT) 0.1 MG/24HR patch, APPLY 1 PATCH(0.1 MG) TOPICALLY TO THE SKIN 2 TIMES A WEEK, Disp: 8 patch, Rfl: 12   HYDROcodone-acetaminophen (NORCO) 5-325 MG tablet, Take 1 tablet by mouth every 8 (eight) hours as needed for moderate pain., Disp: 30 tablet, Rfl: 0   ibuprofen (ADVIL) 200 MG tablet, Take 400 mg by mouth every 6 (six) hours as needed for moderate pain., Disp: , Rfl:    MAGNESIUM PO, Take 1 tablet by mouth at bedtime., Disp: , Rfl:    MELATONIN PO, Take 40 mg by mouth at bedtime., Disp: , Rfl:    MILK THISTLE PO, Take 1 capsule by mouth daily., Disp: , Rfl:    NATTOKINASE PO, Take 2 capsules by mouth daily., Disp: , Rfl:    NP THYROID 120 MG tablet, TAKE 1/2 TABLET BY MOUTH EVERY MORNING, Disp: 45 tablet, Rfl: 0   olmesartan (BENICAR) 20 MG tablet, TAKE 1 TABLET(20 MG) BY MOUTH DAILY, Disp: 90 tablet, Rfl: 1   ondansetron (ZOFRAN ODT) 8 MG disintegrating tablet, Take 1  tablet (8 mg total) by mouth every 8 (eight) hours as needed for nausea or vomiting.,  Disp: 60 tablet, Rfl: 3   predniSONE (DELTASONE) 10 MG tablet, Take 1 tablet (10 mg total) by mouth daily., Disp: 30 tablet, Rfl: 3   predniSONE (DELTASONE) 5 MG tablet, Take 1 tablet (5 mg total) by mouth daily with breakfast., Disp: 30 tablet, Rfl: 5   prochlorperazine (COMPAZINE) 10 MG tablet, Take 1 tablet (10 mg total) by mouth every 6 (six) hours as needed for nausea or vomiting., Disp: 30 tablet, Rfl: 3   prochlorperazine (COMPAZINE) 10 MG tablet, Take 1 tablet (10 mg total) by mouth every 6 (six) hours as needed for nausea or vomiting., Disp: 60 tablet, Rfl: 1   pyridOXINE (VITAMIN B6) 25 MG tablet, Take 25 mg by mouth daily., Disp: , Rfl:    QUERCETIN PO, Take 1 tablet by mouth 2 (two) times daily., Disp: , Rfl:    thiamine (VITAMIN B-1) 50 MG tablet, Take 1 tablet (50 mg total) by mouth daily., Disp: 90 tablet, Rfl: 1   traMADol (ULTRAM) 50 MG tablet, Take 1 tablet (50 mg total) by mouth 2 (two) times daily as needed., Disp: 60 tablet, Rfl: 0   VITAMIN K PO, Take 1 capsule by mouth every other day., Disp: , Rfl:    Zinc 30 MG CAPS, Take 30 mg by mouth daily., Disp: , Rfl:    Allergies: Allergies  Allergen Reactions   Crestor [Rosuvastatin] Other (See Comments)    myalgia   Ivp Dye [Iodinated Contrast Media] Swelling    Swelling on head, took Benadryl and swelling reduced   Wellbutrin [Bupropion] Hives    REVIEW OF SYSTEMS:   Review of Systems  Constitutional:  Negative for chills, fatigue and fever.  HENT:   Negative for lump/mass, mouth sores, nosebleeds, sore throat and trouble swallowing.   Eyes:  Negative for eye problems.  Respiratory:  Positive for shortness of breath. Negative for cough.   Cardiovascular:  Negative for chest pain, leg swelling and palpitations.  Gastrointestinal:  Positive for constipation and diarrhea. Negative for abdominal pain, nausea and vomiting.  Genitourinary:  Negative for bladder incontinence, difficulty urinating, dysuria, frequency,  hematuria and nocturia.   Musculoskeletal:  Negative for arthralgias, back pain, flank pain, myalgias and neck pain.  Skin:  Negative for itching and rash.  Neurological:  Positive for numbness. Negative for dizziness and headaches.  Hematological:  Does not bruise/bleed easily.  Psychiatric/Behavioral:  Positive for sleep disturbance. Negative for depression and suicidal ideas. The patient is not nervous/anxious.   All other systems reviewed and are negative.    VITALS:   Blood pressure 119/71, pulse (!) 58, temperature 97.6 F (36.4 C), temperature source Oral, resp. rate 17, height 5\' 6"  (1.676 m), weight 171 lb 6.4 oz (77.7 kg), SpO2 97 %.  Wt Readings from Last 3 Encounters:  08/01/22 171 lb 6.4 oz (77.7 kg)  07/11/22 174 lb (78.9 kg)  06/20/22 173 lb 8 oz (78.7 kg)    Body mass index is 27.66 kg/m.  Performance status (ECOG): 1 - Symptomatic but completely ambulatory  PHYSICAL EXAM:   Physical Exam Vitals and nursing note reviewed. Exam conducted with a chaperone present.  Constitutional:      Appearance: Normal appearance.  Cardiovascular:     Rate and Rhythm: Normal rate and regular rhythm.     Pulses: Normal pulses.     Heart sounds: Normal heart sounds.  Pulmonary:     Effort: Pulmonary  effort is normal.     Breath sounds: Normal breath sounds.  Abdominal:     Palpations: Abdomen is soft. There is no hepatomegaly, splenomegaly or mass.     Tenderness: There is no abdominal tenderness.  Musculoskeletal:     Right lower leg: No edema.     Left lower leg: No edema.  Lymphadenopathy:     Cervical: No cervical adenopathy.     Right cervical: No superficial, deep or posterior cervical adenopathy.    Left cervical: No superficial, deep or posterior cervical adenopathy.     Upper Body:     Right upper body: No supraclavicular or axillary adenopathy.     Left upper body: No supraclavicular or axillary adenopathy.  Neurological:     General: No focal deficit present.      Mental Status: He is alert and oriented to person, place, and time.  Psychiatric:        Mood and Affect: Mood normal.        Behavior: Behavior normal.     LABS:      Latest Ref Rng & Units 08/01/2022    8:34 AM 07/11/2022    9:31 AM 06/20/2022   11:32 AM  CBC  WBC 4.0 - 10.5 K/uL 5.2  4.8  7.1   Hemoglobin 13.0 - 17.0 g/dL 16.1  09.6  04.5   Hematocrit 39.0 - 52.0 % 33.6  33.6  32.0   Platelets 150 - 400 K/uL 244  299  213       Latest Ref Rng & Units 08/01/2022    8:34 AM 07/11/2022    9:31 AM 06/20/2022   11:32 AM  CMP  Glucose 70 - 99 mg/dL 409  811  914   BUN 8 - 23 mg/dL 18  20  17    Creatinine 0.61 - 1.24 mg/dL 7.82  9.56  2.13   Sodium 135 - 145 mmol/L 133  133  133   Potassium 3.5 - 5.1 mmol/L 3.9  3.9  4.0   Chloride 98 - 111 mmol/L 102  101  103   CO2 22 - 32 mmol/L 23  25  23    Calcium 8.9 - 10.3 mg/dL 9.1  9.3  9.0   Total Protein 6.5 - 8.1 g/dL 6.3  6.7  6.3   Total Bilirubin 0.3 - 1.2 mg/dL 0.8  0.9  0.7   Alkaline Phos 38 - 126 U/L 93  88  82   AST 15 - 41 U/L 16  19  15    ALT 0 - 44 U/L 19  20  17       No results found for: "CEA1", "CEA" / No results found for: "CEA1", "CEA" Lab Results  Component Value Date   PSA1 12.9 (H) 05/26/2020   No results found for: "YQM578" No results found for: "CAN125"  No results found for: "TOTALPROTELP", "ALBUMINELP", "A1GS", "A2GS", "BETS", "BETA2SER", "GAMS", "MSPIKE", "SPEI" Lab Results  Component Value Date   TIBC 336.0 01/09/2021   TIBC 280 04/20/2020   FERRITIN 69.8 01/09/2021   FERRITIN 45 04/20/2020   IRONPCTSAT 45.2 01/09/2021   IRONPCTSAT 31 04/20/2020   No results found for: "LDH"   STUDIES:   CT CHEST ABDOMEN PELVIS W CONTRAST  Result Date: 07/30/2022 CLINICAL DATA:  Evaluate treatment response for metastatic prostate cancer. Known bone metastasis * Tracking Code: BO * EXAM: CT CHEST, ABDOMEN, AND PELVIS WITH CONTRAST TECHNIQUE: Multidetector CT imaging of the chest, abdomen and pelvis was  performed following the  standard protocol during bolus administration of intravenous contrast. RADIATION DOSE REDUCTION: This exam was performed according to the departmental dose-optimization program which includes automated exposure control, adjustment of the mA and/or kV according to patient size and/or use of iterative reconstruction technique. CONTRAST:  OMNIPAQUE IOHEXOL 300 MG/ML  SOLN COMPARISON:  03/26/2022 abdominopelvic CT.  PET 09/28/2021. FINDINGS: CT CHEST FINDINGS Cardiovascular: Aortic atherosclerosis. Normal heart size, without pericardial effusion. Three vessel coronary artery calcification. No central pulmonary embolism, on this non-dedicated study. Mediastinum/Nodes: No supraclavicular adenopathy. No mediastinal or hilar adenopathy, given limitations of unenhanced CT. Lungs/Pleura: No pleural fluid.  Mild centrilobular emphysema. Subpleural inferior right upper lobe pulmonary nodule of 4 mm on 73/3 is similar on the prior PET. Subpleural left lower lobe pulmonary nodule of 3 mm on 110/3 is unchanged. More medial left lower lobe subpleural pulmonary nodule measures 8 mm on 113/3 and is similar to on the prior. Based on coronal reformats, similar to minimally decreased on 101/4 today versus 68/8 of the prior. Posteromedial right lower lobe 3 mm nodule on 95/3 is similar to 03/26/2022. The other tiny left-sided pulmonary nodules on 03/26/2022 are not readily apparent today Left upper lobe calcified granuloma of 3 mm on 71/3. Musculoskeletal: Moderate to marked bilateral gynecomastia. Superior T11 a sclerotic lesion is new including on 115/5 CT ABDOMEN PELVIS FINDINGS Hepatobiliary: Normal liver. Normal gallbladder, without biliary ductal dilatation. Pancreas: Normal, without mass or ductal dilatation. Spleen: Normal in size, without focal abnormality. Adrenals/Urinary Tract: Normal adrenal glands. Normal kidneys, without hydronephrosis. The bladder appears mildly thick walled with pericystic  interstitial thickening. It is however, underdistended. Similar to the prior. Stomach/Bowel: Normal stomach, without wall thickening. Colonic stool burden suggests constipation. Normal terminal ileum and appendix. Normal small bowel. Vascular/Lymphatic: Aortic atherosclerosis. The presumed node along the origin of the IMA is decreased in size today, 5 mm on 78/2 versus 8 mm on the prior. No abdominopelvic adenopathy. Reproductive: Heterogeneous prostate enhancement is nonspecific. Prior TURP. Other: No significant free fluid. Small fat containing left inguinal hernia. Moderate-sized, bilobed fat containing ventral abdominal wall hernia is similar. Musculoskeletal: New left ischial sclerotic 1.9 cm lesion on 124/2. Anterior L1 sclerotic lesion of 2.1 cm measured 2.3 cm on the prior (when remeasured). Sclerosis involving the L2 and less so L4 vertebral bodies is relatively diffuse and similar. Trace L4-5 anterolisthesis. IMPRESSION: 1. Extensive osseous metastasis, including newly sclerotic lesions within the left ischium and T11 vertebral body. This could represent progressive osseous metastasis or healing of otherwise CT occult metastasis. 2. Bibasilar pulmonary nodules, felt to be minimally decreased since 03/26/2022. Suspect response of pulmonary metastasis. 3. The isolated abdominal retroperitoneal borderline adenopathy on the prior exam is decreased and may also represent response to therapy. 4. No soft tissue progression. 5. Bilateral gynecomastia. 6. Possible bladder wall thickening and pericystic edema. Consider correlation with urinalysis. 7. Aortic atherosclerosis (ICD10-I70.0), coronary artery atherosclerosis and emphysema (ICD10-J43.9). Electronically Signed   By: Jeronimo Greaves M.D.   On: 07/30/2022 10:30   NM Bone Scan Whole Body  Result Date: 07/24/2022 CLINICAL DATA:  Metastatic prostate cancer, assess treatment response EXAM: NUCLEAR MEDICINE WHOLE BODY BONE SCAN TECHNIQUE: Whole body anterior and  posterior images were obtained approximately 3 hours after intravenous injection of radiopharmaceutical. RADIOPHARMACEUTICALS:  19.6 mCi Technetium-7m MDP IV COMPARISON:  03/23/2022 Radiographic correlation: CT abdomen and pelvis 03/26/2022 FINDINGS: Multiple sites of abnormal osseous tracer uptake are seen consistent with osseous metastases. These include calvaria, thoracolumbar spine, pelvis, and questionably BILATERAL scapula and cervical  spine. When compared to the previous exam, foci of uptake within the calvaria, thoracic spine, and pelvis appear new. Foci of uptake at the cervical region and inferior scapula appear new. Previously identified increased tracer uptake at L2 and L4 has decreased in the interval since the previous exam. Degenerative type uptake of tracer at the hips, sternoclavicular joints, feet. Expected urinary tract and soft tissue distribution of tracer. IMPRESSION: Multiple new sites of abnormal tracer uptake consistent with osseous metastases. Interval decrease in degree of tracer uptake at L2 and L4 vertebral bodies versus prior study. Electronically Signed   By: Ulyses Southward M.D.   On: 07/24/2022 17:06

## 2022-08-01 ENCOUNTER — Inpatient Hospital Stay (HOSPITAL_BASED_OUTPATIENT_CLINIC_OR_DEPARTMENT_OTHER): Payer: BC Managed Care – PPO | Admitting: Hematology

## 2022-08-01 ENCOUNTER — Inpatient Hospital Stay: Payer: BC Managed Care – PPO

## 2022-08-01 ENCOUNTER — Inpatient Hospital Stay: Payer: BC Managed Care – PPO | Attending: Hematology

## 2022-08-01 DIAGNOSIS — Z803 Family history of malignant neoplasm of breast: Secondary | ICD-10-CM | POA: Diagnosis not present

## 2022-08-01 DIAGNOSIS — G629 Polyneuropathy, unspecified: Secondary | ICD-10-CM | POA: Insufficient documentation

## 2022-08-01 DIAGNOSIS — Z8042 Family history of malignant neoplasm of prostate: Secondary | ICD-10-CM | POA: Insufficient documentation

## 2022-08-01 DIAGNOSIS — M25552 Pain in left hip: Secondary | ICD-10-CM | POA: Insufficient documentation

## 2022-08-01 DIAGNOSIS — M545 Low back pain, unspecified: Secondary | ICD-10-CM | POA: Diagnosis not present

## 2022-08-01 DIAGNOSIS — Z87891 Personal history of nicotine dependence: Secondary | ICD-10-CM | POA: Diagnosis not present

## 2022-08-01 DIAGNOSIS — M810 Age-related osteoporosis without current pathological fracture: Secondary | ICD-10-CM | POA: Insufficient documentation

## 2022-08-01 DIAGNOSIS — C779 Secondary and unspecified malignant neoplasm of lymph node, unspecified: Secondary | ICD-10-CM | POA: Insufficient documentation

## 2022-08-01 DIAGNOSIS — C61 Malignant neoplasm of prostate: Secondary | ICD-10-CM | POA: Diagnosis present

## 2022-08-01 DIAGNOSIS — M25551 Pain in right hip: Secondary | ICD-10-CM | POA: Insufficient documentation

## 2022-08-01 DIAGNOSIS — Z800 Family history of malignant neoplasm of digestive organs: Secondary | ICD-10-CM | POA: Diagnosis not present

## 2022-08-01 DIAGNOSIS — N1330 Unspecified hydronephrosis: Secondary | ICD-10-CM | POA: Diagnosis not present

## 2022-08-01 DIAGNOSIS — C7951 Secondary malignant neoplasm of bone: Secondary | ICD-10-CM | POA: Insufficient documentation

## 2022-08-01 LAB — COMPREHENSIVE METABOLIC PANEL
ALT: 19 U/L (ref 0–44)
AST: 16 U/L (ref 15–41)
Albumin: 3.8 g/dL (ref 3.5–5.0)
Alkaline Phosphatase: 93 U/L (ref 38–126)
Anion gap: 8 (ref 5–15)
BUN: 18 mg/dL (ref 8–23)
CO2: 23 mmol/L (ref 22–32)
Calcium: 9.1 mg/dL (ref 8.9–10.3)
Chloride: 102 mmol/L (ref 98–111)
Creatinine, Ser: 0.79 mg/dL (ref 0.61–1.24)
GFR, Estimated: >60 mL/min (ref >60)
Glucose, Bld: 114 mg/dL — ABNORMAL HIGH (ref 70–99)
Potassium: 3.9 mmol/L (ref 3.5–5.1)
Sodium: 133 mmol/L — ABNORMAL LOW (ref 135–145)
Total Bilirubin: 0.8 mg/dL (ref 0.3–1.2)
Total Protein: 6.3 g/dL — ABNORMAL LOW (ref 6.5–8.1)

## 2022-08-01 LAB — CBC WITH DIFFERENTIAL/PLATELET
Abs Immature Granulocytes: 0.04 10*3/uL (ref 0.00–0.07)
Basophils Absolute: 0.1 10*3/uL (ref 0.0–0.1)
Basophils Relative: 2 %
Eosinophils Absolute: 0.1 10*3/uL (ref 0.0–0.5)
Eosinophils Relative: 1 %
HCT: 33.6 % — ABNORMAL LOW (ref 39.0–52.0)
Hemoglobin: 11.3 g/dL — ABNORMAL LOW (ref 13.0–17.0)
Immature Granulocytes: 1 %
Lymphocytes Relative: 21 %
Lymphs Abs: 1.1 10*3/uL (ref 0.7–4.0)
MCH: 32.7 pg (ref 26.0–34.0)
MCHC: 33.6 g/dL (ref 30.0–36.0)
MCV: 97.1 fL (ref 80.0–100.0)
Monocytes Absolute: 0.6 10*3/uL (ref 0.1–1.0)
Monocytes Relative: 12 %
Neutro Abs: 3.2 10*3/uL (ref 1.7–7.7)
Neutrophils Relative %: 63 %
Platelets: 244 10*3/uL (ref 150–400)
RBC: 3.46 MIL/uL — ABNORMAL LOW (ref 4.22–5.81)
RDW: 14.2 % (ref 11.5–15.5)
WBC: 5.2 10*3/uL (ref 4.0–10.5)
nRBC: 0 % (ref 0.0–0.2)

## 2022-08-01 LAB — PSA: Prostatic Specific Antigen: 45.68 ng/mL — ABNORMAL HIGH (ref 0.00–4.00)

## 2022-08-01 LAB — MAGNESIUM: Magnesium: 2.1 mg/dL (ref 1.7–2.4)

## 2022-08-01 NOTE — Patient Instructions (Signed)
[facility] at [facility] Discharge Instructions   You were seen and examined today by Dr. Ellin Saba.  He reviewed the results of your lab work which are mostly normal/stable. Your PSA went up the last time we checked.   He reviewed the results of your CT scan and bone scan. The CT scan was stable.   He reviewed the results of your bone scan. There are new lesions noted on this scan.   We will hold your treatment today. We will obtain a PET scan that is specialized in finding prostate cancer.   Return as scheduled.    Thank you for choosing [facility] at [facility] to provide your oncology and hematology care.  To afford each patient quality time with our provider, please arrive at least 15 minutes before your scheduled appointment time.   If you have a lab appointment with the Cancer Center please come in thru the Main Entrance and check in at the main information desk.  You need to re-schedule your appointment should you arrive 10 or more minutes late.  We strive to give you quality time with our providers, and arriving late affects you and other patients whose appointments are after yours.  Also, if you no show three or more times for appointments you may be dismissed from the clinic at the providers discretion.     Again, thank you for choosing [facility].  Our hope is that these requests will decrease the amount of time that you wait before being seen by our physicians.       _____________________________________________________________  Should you have questions after your visit to [facility], please contact our office at [phone] and follow the prompts.  Our office hours are 8:00 a.m. and 4:30 p.m. Monday - Friday.  Please note that voicemails left after 4:00 p.m. may not be returned until the following business day.  We are closed weekends and major holidays.  You do have access to a nurse 24-7, just  call the main number to the clinic [phone] and do not press any options, hold on the line and a nurse will answer the phone.    For prescription refill requests, have your pharmacy contact our office and allow 72 hours.    Due to Covid, you will need to wear a mask upon entering the hospital. If you do not have a mask, a mask will be given to you at the Main Entrance upon arrival. For doctor visits, patients may have 1 support person age 89 or older with them. For treatment visits, patients can not have anyone with them due to social distancing guidelines and our immunocompromised population.

## 2022-08-01 NOTE — Progress Notes (Signed)
Treatment held today per MD.  

## 2022-08-03 ENCOUNTER — Inpatient Hospital Stay: Payer: BC Managed Care – PPO

## 2022-08-16 ENCOUNTER — Ambulatory Visit (HOSPITAL_COMMUNITY)
Admission: RE | Admit: 2022-08-16 | Discharge: 2022-08-16 | Disposition: A | Discharge status: Home / Self Care | Payer: BC Managed Care – PPO | Source: Ambulatory Visit | Attending: Hematology | Admitting: Hematology

## 2022-08-16 ENCOUNTER — Other Ambulatory Visit: Payer: Self-pay | Admitting: Hematology

## 2022-08-16 DIAGNOSIS — C61 Malignant neoplasm of prostate: Secondary | ICD-10-CM | POA: Insufficient documentation

## 2022-08-16 MED ORDER — PIFLIFOLASTAT F 18 (PYLARIFY) INJECTION
9.0000 | Freq: Once | INTRAVENOUS | Status: AC
  Administered 2022-08-16: 9.08 via INTRAVENOUS

## 2022-08-17 ENCOUNTER — Encounter: Payer: Self-pay | Admitting: Hematology

## 2022-08-17 ENCOUNTER — Other Ambulatory Visit: Payer: Self-pay

## 2022-08-17 MED ORDER — HYDROCODONE-ACETAMINOPHEN 5-325 MG PO TABS: 1.0000 | ORAL_TABLET | Freq: Three times a day (TID) | ORAL | 0 refills | Status: DC | PRN

## 2022-08-21 NOTE — Progress Notes (Signed)
[facility] [address], [place] [address]    Clinic Day:  08/22/2022  Referring physician: Etta Grandchild, MD  Patient Care Team: Etta Grandchild, MD as PCP - General (Internal Medicine) Doreatha Massed, MD as Consulting Physician (Hematology and Oncology) Jena Gauss Gerrit Friends, MD as Consulting Physician (Gastroenterology) Therese Sarah, RN as Oncology Nurse Navigator (Medical Oncology)   ASSESSMENT & PLAN:   Assessment: 1.  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  Prominent left hydronephrosis and hydroureter noted.  New sclerotic lesion in the left anterior vertebral body of L1.  Mild left periaortic adenopathy increased from prior, short axis lymph node measuring 1 cm. -Last PSA increased to 15.8 on 09/28/2019. -As there is rapid progression of disease, I have recommended docetaxel chemotherapy.  He is agreeable after long discussion. -Cystoscopy with transurethral resection of tumor in the posterior bladder neck, right and left walls of the bladder, right lobe of the prostate on 11/10/2019. -Plan was to do 6 cycles of docetaxel followed by abiraterone and prednisone. -6 cycles of docetaxel from 11/16/2019 through 02/29/2020. -CTAP on 01/06/2020 showed persistent thickening of the urinary bladder wall.  Left hydronephrosis is significantly reduced compared to prior exam.  Left-sided retroperitoneal and left iliac lymph nodes reduced in size.  Interval increase in sclerosis of lesions of the left aspect of L2 vertebral body consistent with treatment response.  No new bone lesions. -Bone scan on 01/06/2020 shows stable L2 metastatic focus. -Bone scan scan images from 06/14/2020 which showed progressive metastatic disease, with increased size and intensity of L2 1 new focus of activity at  L1. 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 -Abiraterone and prednisone from 06/24/2020 through 09/06/2021 with progression. - PSMA PET scan (09/28/2021): Residual/recurrent tumor involving prostate gland and seminal vesicles.  Tracer avid bone metastasis localized L1 and L2 vertebral bodies.  No new sites of bone metastasis disease.  No solid organ or nodal mets. - Darolutamide from 10/17/2021 through 03/15/2022 with progression - Guardant360: MSI high not detected.  Androgen resistance present. - XRT to L2-L4, 30 Gray in 10 fractions completed on 04/23/2022 with improvement in pain. - Cabazitaxel 4 cycles from 05/07/2022 through 07/11/2022 with progression.   2.  Left hydroureteronephrosis: -CT scan showed prostate mass invading the posterior bladder and obstructing the ureter.  Creatinine increased to 1.1. -Renal ultrasound on 12/11/2019 showed interval resolution of previously seen left-sided hydronephrosis.  Previously seen soft tissue mass at the posterior bladder lumen no longer seen.    Plan: 1.  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  Intense radiotracer avid ET at the junction of the left and right seminal vesicles for local prostate cancer recurrence.  No evidence of nodal metastasis other than left hilar lymph nodes.  No visceral disease. - His PSA has increased to 75. - I have recommended Pluvicto every 6 weeks for 6 doses or until disease progression if he gets good response. - We discussed side effects including cytopenias, GI side effects, dry mouth, fatigue, LFT abnormalities and elevated creatinine. - Will make a referral to nuclear medicine.  I will contact Dr. Amil Amen. - RTC 2 to 3 weeks after first dose to check LFTs, CBC and creatinine.   2.  Hot flashes: - Continue  estradiol twice weekly for hot flashes.   3.  Peripheral neuropathy: - Tingling in the left foot and fingertips is stable.    4.  Lower back pain/bilateral hip pains: - He reports slight worsening arm pain.  Continue tramadol 50 mg at bedtime.  Continue hydrocodone 5/325 as needed for severe pains.  5.  Osteoporosis (DEXA scan 02/24/2021 T-score -2.5): - Continue calcium and vitamin D.    No orders of the defined types were placed in this encounter.     I,Katie Daubenspeck,acting as a Neurosurgeon for Doreatha Massed, MD.,have documented all relevant documentation on the behalf of Doreatha Massed, MD,as directed by  Doreatha Massed, MD while in the presence of Doreatha Massed, MD.   I, Doreatha Massed MD, have reviewed the above documentation for accuracy and completeness, and I agree with the above.   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   Prior Therapy: 1. TURP on 11/10/2019. 2. Docetaxel x 6 cycles, 11/16/2019 - 02/29/2020. 3.  Abiraterone and prednisone, 06/24/20 - 09/06/21 4.  Darolutamide, 10/17/21 - 03/15/22  Current Therapy:  Cabazitaxel every 21 days    HISTORY OF PRESENT ILLNESS:   Oncology History  Prostate cancer metastatic to multiple sites [facility])  01/25/2017 Initial Diagnosis   Prostate cancer metastatic to multiple sites [facility])   11/16/2019 - 03/02/2020 Chemotherapy   Patient is on Treatment Plan : PROSTATE Docetaxel + Prednisone q21d      Genetic Testing   Negative genetic testing. No pathogenic variants identified on the [facility] CancerNext-Expanded panel. 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 The report date is 04/19/2020.  The CancerNext-Expanded  gene panel offered by [facility] and includes sequencing and rearrangement analysis for the following 77 genes: IP, ALK, APC*, ATM*, AXIN2, BAP1, BARD1, BLM, BMPR1A, BRCA1*, BRCA2*, BRIP1*, CDC73, CDH1*,CDK4, CDKN1B, CDKN2A, CHEK2*, CTNNA1, DICER1, FANCC,  FH, FLCN, GALNT12, KIF1B, LZTR1, MAX, MEN1, MET, MLH1*, MSH2*, MSH3, MSH6*, MUTYH*, NBN, NF1*, NF2, NTHL1, PALB2*, PHOX2B, PMS2*, POT1, PRKAR1A, PTCH1, PTEN*, RAD51C*, RAD51D*,RB1, RECQL, RET, SDHA, SDHAF2, SDHB, SDHC, SDHD, SMAD4, SMARCA4, SMARCB1, SMARCE1, STK11, SUFU, TMEM127, TP53*,TSC1, TSC2, VHL and XRCC2 (sequencing and deletion/duplication); EGFR, EGLN1, HOXB13, KIT, MITF, PDGFRA, POLD1 and POLE (sequencing only); EPCAM and GREM1 (deletion/duplication only).    05/07/2022 -  Chemotherapy   Patient is on Treatment Plan : PROSTATE Cabazitaxel (20) D1 + Prednisone D1-21 q21d        INTERVAL HISTORY:   Siddiq is a 67 y.o. 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 He was last seen by me on 08/01/22.  Since his last visit, he underwent restaging PSMA PET scan on 08/16/22.  Today, he states that he is doing well overall. His appetite level is at 50%. His energy level is at 50%.  PAST MEDICAL HISTORY:   Past Medical History: Past Medical History:  Diagnosis Date   Bladder obstruction    Complication of anesthesia    Diverticulitis 2015   Dyspnea    Essential hypertension, benign 01/26/2019   at doctor's office elevated, normal at home, no medications at this time   History of basal cell cancer    HLD (hyperlipidemia) 01/26/2019   Hypothyroidism    history of, not currently taking medication   PONV (postoperative nausea and vomiting)    Prostate cancer (HCC)  metastatic to bones   Vitamin D deficiency disease 01/26/2019    Surgical History: Past Surgical History:  Procedure Laterality Date   CYSTOSCOPY W/ URETERAL STENT PLACEMENT Left 11/10/2019   Procedure: CYSTOSCOPY;  Surgeon: Bjorn Pippin, MD;  Location: [facility];  Service: Urology;  Laterality: Left;   ESOPHAGOGASTRODUODENOSCOPY (EGD) WITH PROPOFOL N/A 07/07/2020   Procedure: ESOPHAGOGASTRODUODENOSCOPY (EGD) WITH PROPOFOL;  Surgeon: Corbin Ade, MD;  Location:  [facility];  Service: Endoscopy;  Laterality: N/A;  pm appt   HERNIA REPAIR  1995   UMBILICAL    IR FLUORO GUIDED NEEDLE PLC ASPIRATION/INJECTION LOC  04/09/2022   MALONEY DILATION N/A 07/07/2020   Procedure: MALONEY DILATION;  Surgeon: Corbin Ade, MD;  Location: [facility];  Service: Endoscopy;  Laterality: N/A;   PROSTATE SURGERY     SKIN CANCER EXCISION     TONSILLECTOMY Bilateral    TOOTH EXTRACTION     front left tooth pulled but no surgery or stitches   TRANSURETHRAL RESECTION OF PROSTATE N/A 01/25/2017   Procedure: TRANSURETHRAL RESECTION OF THE PROSTATE (TURP);  Surgeon: Sebastian Ache, MD;  Location: [facility];  Service: Urology;  Laterality: N/A;   TRANSURETHRAL RESECTION OF PROSTATE N/A 11/10/2019   Procedure: TRANSURETHRAL RESECTION OF THE PROSTATE (TURP);  Surgeon: Bjorn Pippin, MD;  Location: [facility];  Service: Urology;  Laterality: N/A;   WISDOM TOOTH EXTRACTION      Social History: Social History   Socioeconomic History   Marital status: Married    Spouse name: Not on file   Number of children: 1   Years of education: Not on file   Highest education level: Not on file  Occupational History    Comment: working full time  Tobacco Use   Smoking status: Former    Packs/day: 1.50    Years: 35.00    Additional pack years: 0.00    Total pack years: 52.50    Types: Cigarettes    Quit date: 01/12/2004    Years since quitting: 18.6   Smokeless tobacco: Never   Tobacco comments:    OVER 30 YEARS HX OF SMOKING   Vaping Use   Vaping Use: Never used  Substance and Sexual Activity   Alcohol use: Not Currently    Comment: heavy drinker until 2004   Drug use: Not Currently    Types: Marijuana   Sexual activity: Not Currently  Other Topics Concern   Not on file  Social History Narrative   Married for 19 years.Works for [employer] from home.   Social Determinants of Health   Financial Resource Strain: Low Risk  (03/02/2020)   Overall Financial Resource  Strain (CARDIA)    Difficulty of Paying Living Expenses: Not hard at all  Food Insecurity: No Food Insecurity (03/02/2020)   Hunger Vital Sign    Worried About Running Out of Food in the Last Year: Never true    Ran Out of Food in the Last Year: Never true  Transportation Needs: No Transportation Needs (03/02/2020)   PRAPARE - Administrator, Civil Service (Medical): No    Lack of Transportation (Non-Medical): No  Physical Activity: Inactive (03/02/2020)   Exercise Vital Sign    Days of Exercise per Week: 0 days    Minutes of Exercise per Session: 0 min  Stress: No Stress Concern Present (03/02/2020)   [employer] of Occupational Health - Occupational Stress Questionnaire    Feeling of Stress : Only a little  Social Connections: Moderately  Integrated (03/02/2020)   Social Connection and Isolation Panel [NHANES]    Frequency of Communication with Friends and Family: Three times a week    Frequency of Social Gatherings with Friends and Family: Never    Attends Religious Services: Never    Database administrator or Organizations: Yes    Attends Banker Meetings: 1 to 4 times per year    Marital Status: Married  Catering manager Violence: Not At Risk (03/02/2020)   Humiliation, Afraid, Rape, and Kick questionnaire    Fear of Current or Ex-Partner: No    Emotionally Abused: No    Physically Abused: No    Sexually Abused: No    Family History: Family History  Problem Relation Age of Onset   Emphysema Mother    Alzheimer's disease Father    Prostate cancer Father        dx in his 53s   Prostate cancer Brother        dx in his 33s   Breast cancer Maternal Grandmother        60s   Pancreatic cancer Maternal Grandfather        86s   Dementia Paternal Grandmother    Heart attack Paternal Grandfather    Colon cancer Neg Hx     Current Medications:  Current Outpatient Medications:    arginine 500 MG tablet, Take 500 mg by mouth 2 (two) times  daily., Disp: , Rfl:    Cholecalciferol 125 MCG (5000 UT) capsule, Take 5,000-10,000 Units by mouth See admin instructions. Take 09811 units in the morning and 5000 units at night, Disp: , Rfl:    DANDELION ROOT PO, Take 500 mg by mouth daily., Disp: , Rfl:    dutasteride (AVODART) 0.5 MG capsule, Take 1 capsule (0.5 mg total) by mouth daily., Disp: 90 capsule, Rfl: 3   estradiol (VIVELLE-DOT) 0.1 MG/24HR patch, APPLY 1 PATCH(0.1 MG) TOPICALLY TO THE SKIN 2 TIMES A WEEK, Disp: 8 patch, Rfl: 12   HYDROcodone-acetaminophen (NORCO) 5-325 MG tablet, Take 1 tablet by mouth every 8 (eight) hours as needed for moderate pain., Disp: 30 tablet, Rfl: 0   ibuprofen (ADVIL) 200 MG tablet, Take 400 mg by mouth every 6 (six) hours as needed for moderate pain., Disp: , Rfl:    MAGNESIUM PO, Take 1 tablet by mouth at bedtime., Disp: , Rfl:    MELATONIN PO, Take 40 mg by mouth at bedtime., Disp: , Rfl:    MILK THISTLE PO, Take 1 capsule by mouth daily., Disp: , Rfl:    NATTOKINASE PO, Take 2 capsules by mouth daily., Disp: , Rfl:    NP THYROID 120 MG tablet, TAKE 1/2 TABLET BY MOUTH EVERY MORNING, Disp: 45 tablet, Rfl: 0   olmesartan (BENICAR) 20 MG tablet, TAKE 1 TABLET(20 MG) BY MOUTH DAILY, Disp: 90 tablet, Rfl: 1   ondansetron (ZOFRAN ODT) 8 MG disintegrating tablet, Take 1 tablet (8 mg total) by mouth every 8 (eight) hours as needed for nausea or vomiting., Disp: 60 tablet, Rfl: 3   predniSONE (DELTASONE) 10 MG tablet, Take 1 tablet (10 mg total) by mouth daily., Disp: 30 tablet, Rfl: 3   predniSONE (DELTASONE) 5 MG tablet, Take 1 tablet (5 mg total) by mouth daily with breakfast., Disp: 30 tablet, Rfl: 5   pyridOXINE (VITAMIN B6) 25 MG tablet, Take 25 mg by mouth daily., Disp: , Rfl:    QUERCETIN PO, Take 1 tablet by mouth 2 (two) times daily., Disp: , Rfl:  thiamine (VITAMIN B-1) 50 MG tablet, Take 1 tablet (50 mg total) by mouth daily., Disp: 90 tablet, Rfl: 1   traMADol (ULTRAM) 50 MG tablet, TAKE 1  TABLET(50 MG) BY MOUTH TWICE DAILY AS NEEDED, Disp: 60 tablet, Rfl: 0   VITAMIN K PO, Take 1 capsule by mouth every other day., Disp: , Rfl:    Zinc 30 MG CAPS, Take 30 mg by mouth daily., Disp: , Rfl:    prochlorperazine (COMPAZINE) 10 MG tablet, Take 1 tablet (10 mg total) by mouth every 6 (six) hours as needed for nausea or vomiting. (Patient not taking: Reported on 08/22/2022), Disp: 30 tablet, Rfl: 3   prochlorperazine (COMPAZINE) 10 MG tablet, Take 1 tablet (10 mg total) by mouth every 6 (six) hours as needed for nausea or vomiting. (Patient not taking: Reported on 08/22/2022), Disp: 60 tablet, Rfl: 1   Allergies: Allergies  Allergen Reactions   Crestor [Rosuvastatin] Other (See Comments)    myalgia   Ivp Dye [Iodinated Contrast Media] Swelling    Swelling on head, took Benadryl and swelling reduced   Wellbutrin [Bupropion] Hives    REVIEW OF SYSTEMS:   Review of Systems  Constitutional:  Negative for chills, fatigue and fever.  HENT:   Negative for lump/mass, mouth sores, nosebleeds, sore throat and trouble swallowing.   Eyes:  Negative for eye problems.  Respiratory:  Positive for shortness of breath. Negative for cough.   Cardiovascular:  Negative for chest pain, leg swelling and palpitations.  Gastrointestinal:  Positive for constipation, diarrhea and nausea. Negative for abdominal pain and vomiting.  Genitourinary:  Negative for bladder incontinence, difficulty urinating, dysuria, frequency, hematuria and nocturia.   Musculoskeletal:  Positive for back pain. Negative for arthralgias, flank pain, myalgias and neck pain.  Skin:  Negative for itching and rash.  Neurological:  Positive for numbness. Negative for dizziness and headaches.  Hematological:  Does not bruise/bleed easily.  Psychiatric/Behavioral:  Positive for sleep disturbance. Negative for depression and suicidal ideas. The patient is not nervous/anxious.   All other systems reviewed and are negative.    VITALS:    Blood pressure 123/74, pulse (!) 52, temperature 98.5 F (36.9 C), temperature source Oral, resp. rate 16, weight 171 lb 3.2 oz (77.7 kg), SpO2 97 %.  Wt Readings from Last 3 Encounters:  08/22/22 171 lb 3.2 oz (77.7 kg)  08/01/22 171 lb 6.4 oz (77.7 kg)  07/11/22 174 lb (78.9 kg)    Body mass index is 27.63 kg/m.  Performance status (ECOG): 1 - Symptomatic but completely ambulatory  PHYSICAL EXAM:   Physical Exam Vitals and nursing note reviewed. Exam conducted with a chaperone present.  Constitutional:      Appearance: Normal appearance.  Cardiovascular:     Rate and Rhythm: Normal rate and regular rhythm.     Pulses: Normal pulses.     Heart sounds: Normal heart sounds.  Pulmonary:     Effort: Pulmonary effort is normal.     Breath sounds: Normal breath sounds.  Abdominal:     Palpations: Abdomen is soft. There is no hepatomegaly, splenomegaly or mass.     Tenderness: There is no abdominal tenderness.  Musculoskeletal:     Right lower leg: No edema.     Left lower leg: No edema.  Lymphadenopathy:     Cervical: No cervical adenopathy.     Right cervical: No superficial, deep or posterior cervical adenopathy.    Left cervical: No superficial, deep or posterior cervical adenopathy.  Upper Body:     Right upper body: No supraclavicular or axillary adenopathy.     Left upper body: No supraclavicular or axillary adenopathy.  Neurological:     General: No focal deficit present.     Mental Status: He is alert and oriented to person, place, and time.  Psychiatric:        Mood and Affect: Mood normal.        Behavior: Behavior normal.     LABS:      Latest Ref Rng & Units 08/22/2022    9:13 AM 08/01/2022    8:34 AM 07/11/2022    9:31 AM  CBC  WBC 4.0 - 10.5 K/uL 4.2  5.2  4.8   Hemoglobin 13.0 - 17.0 g/dL 16.1  09.6  04.5   Hematocrit 39.0 - 52.0 % 32.8  33.6  33.6   Platelets 150 - 400 K/uL 200  244  299       Latest Ref Rng & Units 08/22/2022    9:13 AM  08/01/2022    8:34 AM 07/11/2022    9:31 AM  CMP  Glucose 70 - 99 mg/dL 409  811  914   BUN 8 - 23 mg/dL 17  18  20    Creatinine 0.61 - 1.24 mg/dL 7.82  9.56  2.13   Sodium 135 - 145 mmol/L 134  133  133   Potassium 3.5 - 5.1 mmol/L 3.9  3.9  3.9   Chloride 98 - 111 mmol/L 101  102  101   CO2 22 - 32 mmol/L 25  23  25    Calcium 8.9 - 10.3 mg/dL 9.1  9.1  9.3   Total Protein 6.5 - 8.1 g/dL 6.3  6.3  6.7   Total Bilirubin 0.3 - 1.2 mg/dL 0.8  0.8  0.9   Alkaline Phos 38 - 126 U/L 219  93  88   AST 15 - 41 U/L 22  16  19    ALT 0 - 44 U/L 16  19  20       No results found for: "CEA1", "CEA" / No results found for: "CEA1", "CEA" Lab Results  Component Value Date   PSA1 12.9 (H) 05/26/2020   No results found for: "YQM578" No results found for: "CAN125"  No results found for: "TOTALPROTELP", "ALBUMINELP", "A1GS", "A2GS", "BETS", "BETA2SER", "GAMS", "MSPIKE", "SPEI" Lab Results  Component Value Date   TIBC 336.0 01/09/2021   TIBC 280 04/20/2020   FERRITIN 69.8 01/09/2021   FERRITIN 45 04/20/2020   IRONPCTSAT 45.2 01/09/2021   IRONPCTSAT 31 04/20/2020   No results found for: "LDH"   STUDIES:   NM PET (PSMA) SKULL TO MID THIGH  Result Date: 08/22/2022 CLINICAL DATA:  Prostate cancer. Assess response to treatment. Chemotherapy for 1 month EXAM: NUCLEAR MEDICINE PET SKULL BASE TO THIGH TECHNIQUE: 9.1 mCi F18 Piflufolastat (Pylarify) was injected intravenously. Full-ring PET imaging was performed from the skull base to thigh after the radiotracer. CT data was obtained and used for attenuation correction and anatomic localization. COMPARISON:  MDP bone scan 07/24/2022, PSMA PET scan 09/28/2021 FINDINGS: NECK No radiotracer activity in neck lymph nodes. Incidental CT finding: None. CHEST radiotracer activity associated with small LEFT hilar lymph nodes with SUV max equal 8.5 on image 197 No radiotracer avid pulmonary nodules. Incidental CT finding: None. ABDOMEN/PELVIS Prostate: Intense  activity centrally within the prostate gland is favored urine activity within the urethra. Surgical defect within the prostate gland expands the urethra. More superiorly, there is  a focus intense radiotracer activity at the junction of the LEFT and RIGHT similar vesicle with SUV max equal 27 on image 62. On, comparison to contrast enhanced CT from 07/26/2022 there is a small enhancing nodule identified at this site on the diagnostic CT scan. Lymph nodes: No abnormal radiotracer accumulation within pelvic or abdominal nodes. Liver: No evidence of liver metastasis. Incidental CT finding: None. SKELETON Multifocal intense radiotracer avid skeletal metastasis are markedly progressed from prior PSMA PET scan (09/28/2021) and concordant with more recent MDP bone scan. Lesions involve the axillary appendicular skeleton including the proximal femurs, pelvis, sacrum spine, ribs and calvarium. Example lesion in the LEFT sacral ala with SUV max equal 12.2 image 89. No clear CT correlation. Partially sclerotic lesion at T12 with SUV max equal 7.0. RIGHT scapular lesion with SUV max equal 4.1 on image 188. IMPRESSION: 1. Marked progression of multifocal radiotracer avid skeletal metastasis compared to prior PSMA PET scan 09/28/2021. Multifocal skeletal metastasis involving axillary and appendicular skeleton. 2. Intense radiotracer activity at the junction of the LEFT and RIGHT seminal vesicles is concerning for local prostate cancer recurrence. 3. Intense activity centrally within the prostate gland is favored urine activity within the urethra. 4. No evidence of nodal metastasis in the pelvis or periaortic retroperitoneum. 5. Intense radiotracer activity associated with LEFT hilar lymph nodes is consistent with thoracic nodal metastasis. Electronically Signed   By: Genevive Bi M.D.   On: 08/22/2022 08:40   CT CHEST ABDOMEN PELVIS W CONTRAST  Result Date: 07/30/2022 CLINICAL DATA:  Evaluate treatment response for  metastatic prostate cancer. Known bone metastasis * Tracking Code: BO * EXAM: CT CHEST, ABDOMEN, AND PELVIS WITH CONTRAST TECHNIQUE: Multidetector CT imaging of the chest, abdomen and pelvis was performed following the standard protocol during bolus administration of intravenous contrast. RADIATION DOSE REDUCTION: This exam was performed according to the departmental dose-optimization program which includes automated exposure control, adjustment of the mA and/or kV according to patient size and/or use of iterative reconstruction technique. CONTRAST:  OMNIPAQUE IOHEXOL 300 MG/ML  SOLN COMPARISON:  03/26/2022 abdominopelvic CT.  PET 09/28/2021. FINDINGS: CT CHEST FINDINGS Cardiovascular: Aortic atherosclerosis. Normal heart size, without pericardial effusion. Three vessel coronary artery calcification. No central pulmonary embolism, on this non-dedicated study. Mediastinum/Nodes: No supraclavicular adenopathy. No mediastinal or hilar adenopathy, given limitations of unenhanced CT. Lungs/Pleura: No pleural fluid.  Mild centrilobular emphysema. Subpleural inferior right upper lobe pulmonary nodule of 4 mm on 73/3 is similar on the prior PET. Subpleural left lower lobe pulmonary nodule of 3 mm on 110/3 is unchanged. More medial left lower lobe subpleural pulmonary nodule measures 8 mm on 113/3 and is similar to on the prior. Based on coronal reformats, similar to minimally decreased on 101/4 today versus 68/8 of the prior. Posteromedial right lower lobe 3 mm nodule on 95/3 is similar to 03/26/2022. The other tiny left-sided pulmonary nodules on 03/26/2022 are not readily apparent today Left upper lobe calcified granuloma of 3 mm on 71/3. Musculoskeletal: Moderate to marked bilateral gynecomastia. Superior T11 a sclerotic lesion is new including on 115/5 CT ABDOMEN PELVIS FINDINGS Hepatobiliary: Normal liver. Normal gallbladder, without biliary ductal dilatation. Pancreas: Normal, without mass or ductal dilatation.  Spleen: Normal in size, without focal abnormality. Adrenals/Urinary Tract: Normal adrenal glands. Normal kidneys, without hydronephrosis. The bladder appears mildly thick walled with pericystic interstitial thickening. It is however, underdistended. Similar to the prior. Stomach/Bowel: Normal stomach, without wall thickening. Colonic stool burden suggests constipation. Normal terminal ileum and appendix. Normal  small bowel. Vascular/Lymphatic: Aortic atherosclerosis. The presumed node along the origin of the IMA is decreased in size today, 5 mm on 78/2 versus 8 mm on the prior. No abdominopelvic adenopathy. Reproductive: Heterogeneous prostate enhancement is nonspecific. Prior TURP. Other: No significant free fluid. Small fat containing left inguinal hernia. Moderate-sized, bilobed fat containing ventral abdominal wall hernia is similar. Musculoskeletal: New left ischial sclerotic 1.9 cm lesion on 124/2. Anterior L1 sclerotic lesion of 2.1 cm measured 2.3 cm on the prior (when remeasured). Sclerosis involving the L2 and less so L4 vertebral bodies is relatively diffuse and similar. Trace L4-5 anterolisthesis. IMPRESSION: 1. Extensive osseous metastasis, including newly sclerotic lesions within the left ischium and T11 vertebral body. This could represent progressive osseous metastasis or healing of otherwise CT occult metastasis. 2. Bibasilar pulmonary nodules, felt to be minimally decreased since 03/26/2022. Suspect response of pulmonary metastasis. 3. The isolated abdominal retroperitoneal borderline adenopathy on the prior exam is decreased and may also represent response to therapy. 4. No soft tissue progression. 5. Bilateral gynecomastia. 6. Possible bladder wall thickening and pericystic edema. Consider correlation with urinalysis. 7. Aortic atherosclerosis (ICD10-I70.0), coronary artery atherosclerosis and emphysema (ICD10-J43.9). Electronically Signed   By: Jeronimo Greaves M.D.   On: 07/30/2022 10:30   NM  Bone Scan Whole Body  Result Date: 07/24/2022 CLINICAL DATA:  Metastatic prostate cancer, assess treatment response EXAM: NUCLEAR MEDICINE WHOLE BODY BONE SCAN TECHNIQUE: Whole body anterior and posterior images were obtained approximately 3 hours after intravenous injection of radiopharmaceutical. RADIOPHARMACEUTICALS:  19.6 mCi Technetium-109m MDP IV COMPARISON:  03/23/2022 Radiographic correlation: CT abdomen and pelvis 03/26/2022 FINDINGS: Multiple sites of abnormal osseous tracer uptake are seen consistent with osseous metastases. These include calvaria, thoracolumbar spine, pelvis, and questionably BILATERAL scapula and cervical spine. When compared to the previous exam, foci of uptake within the calvaria, thoracic spine, and pelvis appear new. Foci of uptake at the cervical region and inferior scapula appear new. Previously identified increased tracer uptake at L2 and L4 has decreased in the interval since the previous exam. Degenerative type uptake of tracer at the hips, sternoclavicular joints, feet. Expected urinary tract and soft tissue distribution of tracer. IMPRESSION: Multiple new sites of abnormal tracer uptake consistent with osseous metastases. Interval decrease in degree of tracer uptake at L2 and L4 vertebral bodies versus prior study. Electronically Signed   By: Ulyses Southward M.D.   On: 07/24/2022 17:06

## 2022-08-22 ENCOUNTER — Inpatient Hospital Stay: Payer: BC Managed Care – PPO

## 2022-08-22 ENCOUNTER — Inpatient Hospital Stay: Payer: BC Managed Care – PPO | Attending: Hematology

## 2022-08-22 ENCOUNTER — Inpatient Hospital Stay (HOSPITAL_BASED_OUTPATIENT_CLINIC_OR_DEPARTMENT_OTHER): Payer: BC Managed Care – PPO | Admitting: Hematology

## 2022-08-22 DIAGNOSIS — C61 Malignant neoplasm of prostate: Secondary | ICD-10-CM

## 2022-08-22 DIAGNOSIS — Z87891 Personal history of nicotine dependence: Secondary | ICD-10-CM | POA: Insufficient documentation

## 2022-08-22 DIAGNOSIS — M25552 Pain in left hip: Secondary | ICD-10-CM | POA: Insufficient documentation

## 2022-08-22 DIAGNOSIS — M25551 Pain in right hip: Secondary | ICD-10-CM | POA: Insufficient documentation

## 2022-08-22 DIAGNOSIS — M810 Age-related osteoporosis without current pathological fracture: Secondary | ICD-10-CM | POA: Insufficient documentation

## 2022-08-22 DIAGNOSIS — R232 Flushing: Secondary | ICD-10-CM | POA: Diagnosis not present

## 2022-08-22 DIAGNOSIS — C7951 Secondary malignant neoplasm of bone: Secondary | ICD-10-CM | POA: Insufficient documentation

## 2022-08-22 DIAGNOSIS — Z8042 Family history of malignant neoplasm of prostate: Secondary | ICD-10-CM | POA: Insufficient documentation

## 2022-08-22 DIAGNOSIS — Z803 Family history of malignant neoplasm of breast: Secondary | ICD-10-CM | POA: Diagnosis not present

## 2022-08-22 DIAGNOSIS — R7989 Other specified abnormal findings of blood chemistry: Secondary | ICD-10-CM | POA: Insufficient documentation

## 2022-08-22 DIAGNOSIS — M545 Low back pain, unspecified: Secondary | ICD-10-CM | POA: Insufficient documentation

## 2022-08-22 DIAGNOSIS — N1330 Unspecified hydronephrosis: Secondary | ICD-10-CM | POA: Insufficient documentation

## 2022-08-22 DIAGNOSIS — Z800 Family history of malignant neoplasm of digestive organs: Secondary | ICD-10-CM | POA: Insufficient documentation

## 2022-08-22 DIAGNOSIS — G629 Polyneuropathy, unspecified: Secondary | ICD-10-CM | POA: Diagnosis not present

## 2022-08-22 LAB — CBC WITH DIFFERENTIAL/PLATELET
Abs Immature Granulocytes: 0.02 10*3/uL (ref 0.00–0.07)
Basophils Absolute: 0.1 10*3/uL (ref 0.0–0.1)
Basophils Relative: 1 %
Eosinophils Absolute: 0.3 10*3/uL (ref 0.0–0.5)
Eosinophils Relative: 6 %
HCT: 32.8 % — ABNORMAL LOW (ref 39.0–52.0)
Hemoglobin: 11.2 g/dL — ABNORMAL LOW (ref 13.0–17.0)
Immature Granulocytes: 1 %
Lymphocytes Relative: 19 %
Lymphs Abs: 0.8 10*3/uL (ref 0.7–4.0)
MCH: 32.6 pg (ref 26.0–34.0)
MCHC: 34.1 g/dL (ref 30.0–36.0)
MCV: 95.3 fL (ref 80.0–100.0)
Monocytes Absolute: 0.4 10*3/uL (ref 0.1–1.0)
Monocytes Relative: 10 %
Neutro Abs: 2.7 10*3/uL (ref 1.7–7.7)
Neutrophils Relative %: 63 %
Platelets: 200 10*3/uL (ref 150–400)
RBC: 3.44 MIL/uL — ABNORMAL LOW (ref 4.22–5.81)
RDW: 12 % (ref 11.5–15.5)
WBC: 4.2 10*3/uL (ref 4.0–10.5)
nRBC: 0 % (ref 0.0–0.2)

## 2022-08-22 LAB — COMPREHENSIVE METABOLIC PANEL
ALT: 16 U/L (ref 0–44)
AST: 22 U/L (ref 15–41)
Albumin: 3.7 g/dL (ref 3.5–5.0)
Alkaline Phosphatase: 219 U/L — ABNORMAL HIGH (ref 38–126)
Anion gap: 8 (ref 5–15)
BUN: 17 mg/dL (ref 8–23)
CO2: 25 mmol/L (ref 22–32)
Calcium: 9.1 mg/dL (ref 8.9–10.3)
Chloride: 101 mmol/L (ref 98–111)
Creatinine, Ser: 0.91 mg/dL (ref 0.61–1.24)
GFR, Estimated: >60 mL/min (ref >60)
Glucose, Bld: 102 mg/dL — ABNORMAL HIGH (ref 70–99)
Potassium: 3.9 mmol/L (ref 3.5–5.1)
Sodium: 134 mmol/L — ABNORMAL LOW (ref 135–145)
Total Bilirubin: 0.8 mg/dL (ref 0.3–1.2)
Total Protein: 6.3 g/dL — ABNORMAL LOW (ref 6.5–8.1)

## 2022-08-22 LAB — MAGNESIUM: Magnesium: 2.1 mg/dL (ref 1.7–2.4)

## 2022-08-22 LAB — PSA: Prostatic Specific Antigen: 75.63 ng/mL — ABNORMAL HIGH (ref 0.00–4.00)

## 2022-08-22 NOTE — Patient Instructions (Addendum)
[facility] at [facility] Discharge Instructions   You were seen and examined today by Dr. Ellin Saba.  He reviewed the results of your PET scan. It is showing that the bone metastases are worsened.   We will reach out to the doctor in [place] to see if you are eligible for treatment with Pluvicto.   Return as scheduled.    Thank you for choosing [facility] at [facility] to provide your oncology and hematology care.  To afford each patient quality time with our provider, please arrive at least 15 minutes before your scheduled appointment time.   If you have a lab appointment with the Cancer Center please come in thru the Main Entrance and check in at the main information desk.  You need to re-schedule your appointment should you arrive 10 or more minutes late.  We strive to give you quality time with our providers, and arriving late affects you and other patients whose appointments are after yours.  Also, if you no show three or more times for appointments you may be dismissed from the clinic at the providers discretion.     Again, thank you for choosing [facility].  Our hope is that these requests will decrease the amount of time that you wait before being seen by our physicians.       _____________________________________________________________  Should you have questions after your visit to [facility], please contact our office at [phone] and follow the prompts.  Our office hours are 8:00 a.m. and 4:30 p.m. Monday - Friday.  Please note that voicemails left after 4:00 p.m. may not be returned until the following business day.  We are closed weekends and major holidays.  You do have access to a nurse 24-7, just call the main number to the clinic [phone] and do not press any options, hold on the line and a nurse will answer the phone.    For prescription refill requests, have your pharmacy contact our  office and allow 72 hours.    Due to Covid, you will need to wear a mask upon entering the hospital. If you do not have a mask, a mask will be given to you at the Main Entrance upon arrival. For doctor visits, patients may have 1 support person age 63 or older with them. For treatment visits, patients can not have anyone with them due to social distancing guidelines and our immunocompromised population.

## 2022-08-22 NOTE — Progress Notes (Signed)
We will be holding treatment due to progression. We will refer him to [facility] for treatment with Pluvicto per Dr.K.

## 2022-08-24 ENCOUNTER — Other Ambulatory Visit (HOSPITAL_COMMUNITY): Payer: Self-pay | Admitting: Hematology

## 2022-08-24 ENCOUNTER — Inpatient Hospital Stay: Payer: BC Managed Care – PPO

## 2022-08-24 DIAGNOSIS — C61 Malignant neoplasm of prostate: Secondary | ICD-10-CM

## 2022-08-27 ENCOUNTER — Encounter: Payer: Self-pay | Admitting: Internal Medicine

## 2022-08-27 ENCOUNTER — Ambulatory Visit (INDEPENDENT_AMBULATORY_CARE_PROVIDER_SITE_OTHER): Payer: BC Managed Care – PPO | Admitting: Internal Medicine

## 2022-08-27 VITALS — BP 128/76 | HR 67 | Temp 98.1°F | Resp 16 | Ht 66.0 in | Wt 170.0 lb

## 2022-08-27 DIAGNOSIS — E039 Hypothyroidism, unspecified: Secondary | ICD-10-CM

## 2022-08-27 DIAGNOSIS — D538 Other specified nutritional anemias: Secondary | ICD-10-CM | POA: Diagnosis not present

## 2022-08-27 DIAGNOSIS — I10 Essential (primary) hypertension: Secondary | ICD-10-CM | POA: Diagnosis not present

## 2022-08-27 DIAGNOSIS — E519 Thiamine deficiency, unspecified: Secondary | ICD-10-CM

## 2022-08-27 DIAGNOSIS — E785 Hyperlipidemia, unspecified: Secondary | ICD-10-CM | POA: Diagnosis not present

## 2022-08-27 LAB — LIPID PANEL
Cholesterol: 189 mg/dL (ref 0–200)
HDL: 40.9 mg/dL (ref >39.00)
LDL Cholesterol: 116 mg/dL — ABNORMAL HIGH (ref 0–99)
NonHDL: 148.32
Total CHOL/HDL Ratio: 5
Triglycerides: 160 mg/dL — ABNORMAL HIGH (ref 0.0–149.0)
VLDL: 32 mg/dL (ref 0.0–40.0)

## 2022-08-27 LAB — TSH: TSH: 2 u[IU]/mL (ref 0.35–5.50)

## 2022-08-27 MED ORDER — VITAMIN B-1 50 MG PO TABS
50.0000 mg | ORAL_TABLET | Freq: Every day | ORAL | 1 refills | Status: DC
Start: 2022-08-27 — End: 2023-10-08

## 2022-08-27 NOTE — Progress Notes (Signed)
Subjective:  Patient ID: Scott [name], male    DOB: 02/09/1956  Age: 67 y.o. MRN: [id number]  CC: Anemia, Hypertension, and Hyperlipidemia   HPI Scott [name] presents for f/up ----   He complains of chronic, unchanged shortness of breath.  He denies chest pain, diaphoresis, palpitations, or edema.  Outpatient Medications Prior to Visit  Medication Sig Dispense Refill   arginine 500 MG tablet Take 500 mg by mouth 2 (two) times daily.     Cholecalciferol 125 MCG (5000 UT) capsule Take 5,000-10,000 Units by mouth See admin instructions. Take 82956 units in the morning and 5000 units at night     DANDELION ROOT PO Take 500 mg by mouth daily.     dutasteride (AVODART) 0.5 MG capsule Take 1 capsule (0.5 mg total) by mouth daily. 90 capsule 3   estradiol (VIVELLE-DOT) 0.1 MG/24HR patch APPLY 1 PATCH(0.1 MG) TOPICALLY TO THE SKIN 2 TIMES A WEEK 8 patch 12   HYDROcodone-acetaminophen (NORCO) 5-325 MG tablet Take 1 tablet by mouth every 8 (eight) hours as needed for moderate pain. 30 tablet 0   MAGNESIUM PO Take 1 tablet by mouth at bedtime.     MELATONIN PO Take 40 mg by mouth at bedtime.     MILK THISTLE PO Take 1 capsule by mouth daily.     NATTOKINASE PO Take 2 capsules by mouth daily.     ondansetron (ZOFRAN ODT) 8 MG disintegrating tablet Take 1 tablet (8 mg total) by mouth every 8 (eight) hours as needed for nausea or vomiting. 60 tablet 3   predniSONE (DELTASONE) 10 MG tablet Take 1 tablet (10 mg total) by mouth daily. 30 tablet 3   predniSONE (DELTASONE) 5 MG tablet Take 1 tablet (5 mg total) by mouth daily with breakfast. 30 tablet 5   prochlorperazine (COMPAZINE) 10 MG tablet Take 1 tablet (10 mg total) by mouth every 6 (six) hours as needed for nausea or vomiting. 30 tablet 3   prochlorperazine (COMPAZINE) 10 MG tablet Take 1 tablet (10 mg total) by mouth every 6 (six) hours as needed for nausea or vomiting. 60 tablet 1   pyridOXINE (VITAMIN B6) 25 MG tablet Take 25 mg by mouth daily.      QUERCETIN PO Take 1 tablet by mouth 2 (two) times daily.     traMADol (ULTRAM) 50 MG tablet TAKE 1 TABLET(50 MG) BY MOUTH TWICE DAILY AS NEEDED 60 tablet 0   VITAMIN K PO Take 1 capsule by mouth every other day.     Zinc 30 MG CAPS Take 30 mg by mouth daily.     ibuprofen (ADVIL) 200 MG tablet Take 400 mg by mouth every 6 (six) hours as needed for moderate pain.     NP THYROID 120 MG tablet TAKE 1/2 TABLET BY MOUTH EVERY MORNING 45 tablet 0   olmesartan (BENICAR) 20 MG tablet TAKE 1 TABLET(20 MG) BY MOUTH DAILY 90 tablet 1   thiamine (VITAMIN B-1) 50 MG tablet Take 1 tablet (50 mg total) by mouth daily. 90 tablet 1   No facility-administered medications prior to visit.    ROS Review of Systems  Constitutional: Negative.  Negative for diaphoresis, fatigue and unexpected weight change.  HENT: Negative.    Respiratory:  Positive for shortness of breath. Negative for cough, chest tightness and wheezing.   Cardiovascular:  Negative for chest pain, palpitations and leg swelling.  Gastrointestinal:  Negative for abdominal pain, constipation, diarrhea, nausea and vomiting.  Genitourinary: Negative.  Negative for  difficulty urinating.  Musculoskeletal: Negative.  Negative for arthralgias and myalgias.  Skin:  Positive for pallor.  Neurological:  Negative for dizziness, weakness, light-headedness and numbness.  Hematological:  Negative for adenopathy. Does not bruise/bleed easily.  Psychiatric/Behavioral: Negative.      Objective:  BP 128/76 (BP Location: Left Arm, Patient Position: Sitting, Cuff Size: Large)   Pulse 67   Temp 98.1 F (36.7 C) (Oral)   Resp 16   Ht 5\' 6"  (1.676 m)   Wt 170 lb (77.1 kg)   SpO2 97%   BMI 27.44 kg/m   BP Readings from Last 3 Encounters:  08/27/22 128/76  08/22/22 123/74  08/01/22 119/71    Wt Readings from Last 3 Encounters:  08/27/22 170 lb (77.1 kg)  08/22/22 171 lb 3.2 oz (77.7 kg)  08/01/22 171 lb 6.4 oz (77.7 kg)    Physical  Exam Vitals reviewed.  Constitutional:      Appearance: He is not ill-appearing.  HENT:     Mouth/Throat:     Mouth: Mucous membranes are moist.  Eyes:     General: No scleral icterus.    Conjunctiva/sclera: Conjunctivae normal.  Cardiovascular:     Rate and Rhythm: Normal rate and regular rhythm.     Heart sounds: Normal heart sounds, S1 normal and S2 normal. No murmur heard.    No friction rub. No gallop.     Comments: EKG- NSR, 62 bpm Low voltage No LVH or Q waves Pulmonary:     Effort: Pulmonary effort is normal.     Breath sounds: No stridor. No wheezing, rhonchi or rales.  Abdominal:     General: Abdomen is flat.     Palpations: There is no mass.     Tenderness: There is no abdominal tenderness. There is no guarding.     Hernia: No hernia is present.  Musculoskeletal:     Cervical back: Neck supple.     Right lower leg: No edema.     Left lower leg: No edema.  Lymphadenopathy:     Cervical: No cervical adenopathy.  Skin:    General: Skin is warm and dry.     Coloration: Skin is pale.     Findings: No rash.  Neurological:     General: No focal deficit present.     Mental Status: He is alert. Mental status is at baseline.  Psychiatric:        Mood and Affect: Mood normal.        Behavior: Behavior normal.     Lab Results  Component Value Date   WBC 4.2 08/22/2022   HGB 11.2 (L) 08/22/2022   HCT 32.8 (L) 08/22/2022   PLT 200 08/22/2022   GLUCOSE 102 (H) 08/22/2022   CHOL 189 08/27/2022   TRIG 160.0 (H) 08/27/2022   HDL 40.90 08/27/2022   LDLCALC 116 (H) 08/27/2022   ALT 16 08/22/2022   AST 22 08/22/2022   NA 134 (L) 08/22/2022   K 3.9 08/22/2022   CL 101 08/22/2022   CREATININE 0.91 08/22/2022   BUN 17 08/22/2022   CO2 25 08/22/2022   TSH 2.00 08/27/2022   PSA 1.12 02/08/2021   INR 1.0 04/09/2022   HGBA1C 5.8 02/22/2022    NM PET (PSMA) SKULL TO MID THIGH  Result Date: 08/24/2022 CLINICAL DATA:  Prostate cancer. Assess response to treatment.  Chemotherapy for 1 month EXAM: NUCLEAR MEDICINE PET SKULL BASE TO THIGH TECHNIQUE: 9.1 mCi F18 Piflufolastat (Pylarify) was injected intravenously. Full-ring PET imaging  was performed from the skull base to thigh after the radiotracer. CT data was obtained and used for attenuation correction and anatomic localization. COMPARISON:  MDP bone scan 07/24/2022, PSMA PET scan 09/28/2021 FINDINGS: NECK No radiotracer activity in neck lymph nodes. Incidental CT finding: None. CHEST radiotracer activity associated with small LEFT hilar lymph nodes with SUV max equal 8.5 on image 197 No radiotracer avid pulmonary nodules. Incidental CT finding: None. ABDOMEN/PELVIS Prostate: Intense activity centrally within the prostate gland is favored urine activity within the urethra. Surgical defect within the prostate gland expands the urethra. More superiorly, there is a focus intense radiotracer activity at the junction of the LEFT and RIGHT similar vesicle with SUV max equal 27 on image 62. On, comparison to contrast enhanced CT from 07/26/2022 there is a small enhancing nodule identified at this site on the diagnostic CT scan. Lymph nodes: No abnormal radiotracer accumulation within pelvic or abdominal nodes. Liver: No evidence of liver metastasis. Incidental CT finding: None. SKELETON Multifocal intense radiotracer avid skeletal metastasis are markedly progressed from prior PSMA PET scan (09/28/2021) and concordant with more recent MDP bone scan. Lesions involve the axillary appendicular skeleton including the proximal femurs, pelvis, sacrum spine, ribs and calvarium. Example lesion in the LEFT sacral ala with SUV max equal 12.2 image 89. No clear CT correlation. Partially sclerotic lesion at T12 with SUV max equal 7.0. RIGHT scapular lesion with SUV max equal 4.1 on image 188. IMPRESSION: 1. Marked progression of multifocal radiotracer avid skeletal metastasis compared to prior PSMA PET scan 09/28/2021. Multifocal skeletal  metastasis involving axillary and appendicular skeleton. 2. Intense radiotracer activity at the junction of the LEFT and RIGHT seminal vesicles is concerning for local prostate cancer recurrence. 3. Intense activity centrally within the prostate gland is favored urine activity within the urethra. 4. No evidence of nodal metastasis in the pelvis or periaortic retroperitoneum. 5. Intense radiotracer activity associated with LEFT hilar lymph nodes is consistent with thoracic nodal metastasis. Electronically Signed   By: Genevive Bi M.D.   On: 08/22/2022 08:40    Assessment & Plan:   Hypertension, unspecified type- His blood pressure is well-controlled.  EKG is reassuring. -     TSH; Future -     EKG 12-Lead -     Olmesartan Medoxomil; TAKE 1 TABLET(20 MG) BY MOUTH DAILY  Dispense: 90 tablet; Refill: 1  Hyperlipidemia with target LDL less than - He is not willing to take a statin. -     Lipid panel; Future -     TSH; Future  Acquired hypothyroidism- He is euthyroid. -     TSH; Future -     NP Thyroid; Take 0.5 tablets (60 mg total) by mouth every morning.  Dispense: 45 tablet; Refill: 1  Anemia due to acquired thiamine deficiency -     Vitamin B-1; Take 1 tablet (50 mg total) by mouth daily.  Dispense: 90 tablet; Refill: 1     Follow-up: Return in about 6 months (around 02/26/2023).  Sanda Linger, MD

## 2022-08-27 NOTE — Patient Instructions (Signed)
Hypertension, Adult High blood pressure (hypertension) is when the force of blood pumping through the arteries is too strong. The arteries are the blood vessels that carry blood from the heart throughout the body. Hypertension forces the heart to work harder to pump blood and may cause arteries to become narrow or stiff. Untreated or uncontrolled hypertension can lead to a heart attack, heart failure, a stroke, kidney disease, and other problems. A blood pressure reading consists of a higher number over a lower number. Ideally, your blood pressure should be below 120/80. The first ("top") number is called the systolic pressure. It is a measure of the pressure in your arteries as your heart beats. The second ("bottom") number is called the diastolic pressure. It is a measure of the pressure in your arteries as the heart relaxes. What are the causes? The exact cause of this condition is not known. There are some conditions that result in high blood pressure. What increases the risk? Certain factors may make you more likely to develop high blood pressure. Some of these risk factors are under your control, including: Smoking. Not getting enough exercise or physical activity. Being overweight. Having too much fat, sugar, calories, or salt (sodium) in your diet. Drinking too much alcohol. Other risk factors include: Having a personal history of heart disease, diabetes, high cholesterol, or kidney disease. Stress. Having a family history of high blood pressure and high cholesterol. Having obstructive sleep apnea. Age. The risk increases with age. What are the signs or symptoms? High blood pressure may not cause symptoms. Very high blood pressure (hypertensive crisis) may cause: Headache. Fast or irregular heartbeats (palpitations). Shortness of breath. Nosebleed. Nausea and vomiting. Vision changes. Severe chest pain, dizziness, and seizures. How is this diagnosed? This condition is diagnosed by  measuring your blood pressure while you are seated, with your arm resting on a flat surface, your legs uncrossed, and your feet flat on the floor. The cuff of the blood pressure monitor will be placed directly against the skin of your upper arm at the level of your heart. Blood pressure should be measured at least twice using the same arm. Certain conditions can cause a difference in blood pressure between your right and left arms. If you have a high blood pressure reading during one visit or you have normal blood pressure with other risk factors, you may be asked to: Return on a different day to have your blood pressure checked again. Monitor your blood pressure at home for 1 week or longer. If you are diagnosed with hypertension, you may have other blood or imaging tests to help your health care provider understand your overall risk for other conditions. How is this treated? This condition is treated by making healthy lifestyle changes, such as eating healthy foods, exercising more, and reducing your alcohol intake. You may be referred for counseling on a healthy diet and physical activity. Your health care provider may prescribe medicine if lifestyle changes are not enough to get your blood pressure under control and if: Your systolic blood pressure is above 130. Your diastolic blood pressure is above 80. Your personal target blood pressure may vary depending on your medical conditions, your age, and other factors. Follow these instructions at home: Eating and drinking  Eat a diet that is high in fiber and potassium, and low in sodium, added sugar, and fat. An example of this eating plan is called the DASH diet. DASH stands for Dietary Approaches to Stop Hypertension. To eat this way: Eat   plenty of fresh fruits and vegetables. Try to fill one half of your plate at each meal with fruits and vegetables. Eat whole grains, such as whole-wheat pasta, brown rice, or whole-grain bread. Fill about one  fourth of your plate with whole grains. Eat or drink low-fat dairy products, such as skim milk or low-fat yogurt. Avoid fatty cuts of meat, processed or cured meats, and poultry with skin. Fill about one fourth of your plate with lean proteins, such as fish, chicken without skin, beans, eggs, or tofu. Avoid pre-made and processed foods. These tend to be higher in sodium, added sugar, and fat. Reduce your daily sodium intake. Many people with hypertension should eat less than 1,500 mg of sodium a day. Do not drink alcohol if: Your health care provider tells you not to drink. You are pregnant, may be pregnant, or are planning to become pregnant. If you drink alcohol: Limit how much you have to: 0-1 drink a day for women. 0-2 drinks a day for men. Know how much alcohol is in your drink. In the [place] one drink equals one 12 oz bottle of beer (355 mL), one 5 oz glass of wine (148 mL), or one 1 oz glass of hard liquor (44 mL). Lifestyle  Work with your health care provider to maintain a healthy body weight or to lose weight. Ask what an ideal weight is for you. Get at least 30 minutes of exercise that causes your heart to beat faster (aerobic exercise) most days of the week. Activities may include walking, swimming, or biking. Include exercise to strengthen your muscles (resistance exercise), such as Pilates or lifting weights, as part of your weekly exercise routine. Try to do these types of exercises for 30 minutes at least 3 days a week. Do not use any products that contain nicotine or tobacco. These products include cigarettes, chewing tobacco, and vaping devices, such as e-cigarettes. If you need help quitting, ask your health care provider. Monitor your blood pressure at home as told by your health care provider. Keep all follow-up visits. This is important. Medicines Take over-the-counter and prescription medicines only as told by your health care provider. Follow directions carefully. Blood  pressure medicines must be taken as prescribed. Do not skip doses of blood pressure medicine. Doing this puts you at risk for problems and can make the medicine less effective. Ask your health care provider about side effects or reactions to medicines that you should watch for. Contact a health care provider if you: Think you are having a reaction to a medicine you are taking. Have headaches that keep coming back (recurring). Feel dizzy. Have swelling in your ankles. Have trouble with your vision. Get help right away if you: Develop a severe headache or confusion. Have unusual weakness or numbness. Feel faint. Have severe pain in your chest or abdomen. Vomit repeatedly. Have trouble breathing. These symptoms may be an emergency. Get help right away. Call 911. Do not wait to see if the symptoms will go away. Do not drive yourself to the hospital. Summary Hypertension is when the force of blood pumping through your arteries is too strong. If this condition is not controlled, it may put you at risk for serious complications. Your personal target blood pressure may vary depending on your medical conditions, your age, and other factors. For most people, a normal blood pressure is less than 120/80. Hypertension is treated with lifestyle changes, medicines, or a combination of both. Lifestyle changes include losing weight, eating a healthy,   low-sodium diet, exercising more, and limiting alcohol. This information is not intended to replace advice given to you by your health care provider. Make sure you discuss any questions you have with your health care provider. Document Revised: 01/10/2021 Document Reviewed: 01/10/2021 Elsevier Patient Education  2024 [employer]  

## 2022-08-29 ENCOUNTER — Other Ambulatory Visit (HOSPITAL_COMMUNITY): Payer: Self-pay | Admitting: Hematology

## 2022-08-29 ENCOUNTER — Ambulatory Visit (HOSPITAL_COMMUNITY)
Admission: RE | Admit: 2022-08-29 | Discharge: 2022-08-29 | Disposition: A | Discharge status: Home / Self Care | Payer: BC Managed Care – PPO | Source: Ambulatory Visit | Attending: Hematology | Admitting: Hematology

## 2022-08-29 DIAGNOSIS — C61 Malignant neoplasm of prostate: Secondary | ICD-10-CM

## 2022-08-29 NOTE — Consult Note (Signed)
Chief Complaint: [67 year old male with metastatic prostate carcinoma diagnosed 2018.  Patient diagnosed with metastatic disease on presentation.  Patient has undergone androgen  deprivation, 2 rounds of chemotherapy, and directed radiation therapy lumbar spine.  Recent PSA is is increasing and patient demonstrates progression of disease PSMA PET scan.]  Referring Physician(s):Katragaddra     Patient Status: [facility] - Out-pt  History of Present Illness: Scott [name] is a 67 y.o. male [ with metastatic prostate carcinoma diagnosed 2018.  Patient diagnosed with metastatic disease on presentation.  Patient has undergone androgen  deprivation, 2 rounds of chemotherapy, and directed radiation therapy lumbar spine.  Recent PSA is is increasing and patient demonstrates progression of disease PSMA PET scan.]  Past Medical History:  Diagnosis Date   Bladder obstruction    Complication of anesthesia    Diverticulitis 2015   Dyspnea    Essential hypertension, benign 01/26/2019   at doctor's office elevated, normal at home, no medications at this time   History of basal cell cancer    HLD (hyperlipidemia) 01/26/2019   Hypothyroidism    history of, not currently taking medication   PONV (postoperative nausea and vomiting)    Prostate cancer (HCC)    metastatic to bones   Vitamin D deficiency disease 01/26/2019    Past Surgical History:  Procedure Laterality Date   CYSTOSCOPY W/ URETERAL STENT PLACEMENT Left 11/10/2019   Procedure: CYSTOSCOPY;  Surgeon: Bjorn Pippin, MD;  Location: [facility];  Service: Urology;  Laterality: Left;   ESOPHAGOGASTRODUODENOSCOPY (EGD) WITH PROPOFOL N/A 07/07/2020   Procedure: ESOPHAGOGASTRODUODENOSCOPY (EGD) WITH PROPOFOL;  Surgeon: Corbin Ade, MD;  Location: [facility];  Service: Endoscopy;  Laterality: N/A;  pm appt   HERNIA REPAIR  1995   UMBILICAL    IR FLUORO GUIDED NEEDLE PLC ASPIRATION/INJECTION LOC  04/09/2022   MALONEY DILATION N/A 07/07/2020    Procedure: MALONEY DILATION;  Surgeon: Corbin Ade, MD;  Location: [facility];  Service: Endoscopy;  Laterality: N/A;   PROSTATE SURGERY     SKIN CANCER EXCISION     TONSILLECTOMY Bilateral    TOOTH EXTRACTION     front left tooth pulled but no surgery or stitches   TRANSURETHRAL RESECTION OF PROSTATE N/A 01/25/2017   Procedure: TRANSURETHRAL RESECTION OF THE PROSTATE (TURP);  Surgeon: Sebastian Ache, MD;  Location: [facility];  Service: Urology;  Laterality: N/A;   TRANSURETHRAL RESECTION OF PROSTATE N/A 11/10/2019   Procedure: TRANSURETHRAL RESECTION OF THE PROSTATE (TURP);  Surgeon: Bjorn Pippin, MD;  Location: [facility];  Service: Urology;  Laterality: N/A;   WISDOM TOOTH EXTRACTION      Allergies: Crestor [rosuvastatin], Ivp dye [iodinated contrast media], and Wellbutrin [bupropion]  Medications: Prior to Admission medications   Medication Sig Start Date End Date Taking? Authorizing Provider  arginine 500 MG tablet Take 500 mg by mouth 2 (two) times daily.    [provider]  Cholecalciferol 125 MCG (5000 UT) capsule Take 5,000-10,000 Units by mouth See admin instructions. Take 16109 units in the morning and 5000 units at night    [provider]  DANDELION ROOT PO Take 500 mg by mouth daily.    [provider]  dutasteride (AVODART) 0.5 MG capsule Take 1 capsule (0.5 mg total) by mouth daily. 12/07/21   Bjorn Pippin, MD  estradiol (VIVELLE-DOT) 0.1 MG/24HR patch APPLY 1 PATCH(0.1 MG) TOPICALLY TO THE SKIN 2 TIMES A WEEK 11/13/21   Doreatha Massed, MD  HYDROcodone-acetaminophen (NORCO) 5-325 MG tablet  Take 1 tablet by mouth every 8 (eight) hours as needed for moderate pain. 08/17/22   Doreatha Massed, MD  MAGNESIUM PO Take 1 tablet by mouth at bedtime.    [provider]  MELATONIN PO Take 40 mg by mouth at bedtime.    [provider]  MILK THISTLE PO Take 1 capsule by mouth daily.    [provider]  NATTOKINASE PO Take 2  capsules by mouth daily.    [provider]  NP THYROID 120 MG tablet TAKE 1/2 TABLET BY MOUTH EVERY MORNING 07/23/22   Etta Grandchild, MD  olmesartan (BENICAR) 20 MG tablet TAKE 1 TABLET(20 MG) BY MOUTH DAILY 03/30/22   Etta Grandchild, MD  ondansetron (ZOFRAN ODT) 8 MG disintegrating tablet Take 1 tablet (8 mg total) by mouth every 8 (eight) hours as needed for nausea or vomiting. 11/29/21   Doreatha Massed, MD  predniSONE (DELTASONE) 10 MG tablet Take 1 tablet (10 mg total) by mouth daily. 05/07/22   Doreatha Massed, MD  predniSONE (DELTASONE) 5 MG tablet Take 1 tablet (5 mg total) by mouth daily with breakfast. 05/07/22   Doreatha Massed, MD  prochlorperazine (COMPAZINE) 10 MG tablet Take 1 tablet (10 mg total) by mouth every 6 (six) hours as needed for nausea or vomiting. 05/07/22   Doreatha Massed, MD  prochlorperazine (COMPAZINE) 10 MG tablet Take 1 tablet (10 mg total) by mouth every 6 (six) hours as needed for nausea or vomiting. 05/07/22   Doreatha Massed, MD  pyridOXINE (VITAMIN B6) 25 MG tablet Take 25 mg by mouth daily.    [provider]  QUERCETIN PO Take 1 tablet by mouth 2 (two) times daily.    [provider]  thiamine (VITAMIN B-1) 50 MG tablet Take 1 tablet (50 mg total) by mouth daily. 08/27/22   Etta Grandchild, MD  traMADol (ULTRAM) 50 MG tablet TAKE 1 TABLET(50 MG) BY MOUTH TWICE DAILY AS NEEDED 08/17/22   Doreatha Massed, MD  VITAMIN K PO Take 1 capsule by mouth every other day.    [provider]  Zinc 30 MG CAPS Take 30 mg by mouth daily.    [provider]     Family History  Problem Relation Age of Onset   Emphysema Mother    Alzheimer's disease Father    Prostate cancer Father        dx in his 58s   Prostate cancer Brother        dx in his 34s   Breast cancer Maternal Grandmother        27s   Pancreatic cancer Maternal Grandfather        63s   Dementia Paternal Grandmother    Heart attack  Paternal Grandfather    Colon cancer Neg Hx     Social History   Socioeconomic History   Marital status: Married    Spouse name: Not on file   Number of children: 1   Years of education: Not on file   Highest education level: Bachelor's degree (e.g., BA, AB, BS)  Occupational History    Comment: working full time  Tobacco Use   Smoking status: Former    Packs/day: 1.50    Years: 35.00    Additional pack years: 0.00    Total pack years: 52.50    Types: Cigarettes    Quit date: 01/12/2004    Years since quitting: 18.6   Smokeless tobacco: Never   Tobacco comments:  OVER 30 YEARS HX OF SMOKING   Vaping Use   Vaping Use: Never used  Substance and Sexual Activity   Alcohol use: Not Currently    Comment: heavy drinker until 2004   Drug use: Not Currently    Types: Marijuana   Sexual activity: Not Currently  Other Topics Concern   Not on file  Social History Narrative   Married for 19 years.Works for [employer] from home.   Social Determinants of Health   Financial Resource Strain: Low Risk  (08/24/2022)   Overall Financial Resource Strain (CARDIA)    Difficulty of Paying Living Expenses: Not very hard  Food Insecurity: No Food Insecurity (08/24/2022)   Hunger Vital Sign    Worried About Running Out of Food in the Last Year: Never true    Ran Out of Food in the Last Year: Never true  Transportation Needs: No Transportation Needs (08/24/2022)   PRAPARE - Administrator, Civil Service (Medical): No    Lack of Transportation (Non-Medical): No  Physical Activity: Insufficiently Active (08/24/2022)   Exercise Vital Sign    Days of Exercise per Week: 5 days    Minutes of Exercise per Session: 20 min  Stress: Stress Concern Present (08/24/2022)   [employer] of Occupational Health - Occupational Stress Questionnaire    Feeling of Stress : To some extent  Social Connections: Socially Isolated (08/24/2022)   Social Connection and Isolation Panel [NHANES]     Frequency of Communication with Friends and Family: Once a week    Frequency of Social Gatherings with Friends and Family: Once a week    Attends Religious Services: Never    Database administrator or Organizations: No    Attends Engineer, structural: Not on file    Marital Status: Married    ECOG Status: 1 - Symptomatic but completely ambulatory  Review of Systems: A 12 point ROS discussed and pertinent positives are indicated in the HPI above.  All other systems are negative.  Review of Systems  Musculoskeletal:  Positive for back pain.       Multifocal bone pain. Hip pain    Vital Signs: There were no vitals taken for this visit.  Physical Exam Constitutional:      Appearance: Normal appearance. He is normal weight.  Neurological:     Mental Status: He is alert.     Imaging: No results found.  Labs:  CBC: Recent Labs    06/20/22 1132 07/11/22 0931 08/01/22 0834 08/22/22 0913  WBC 7.1 4.8 5.2 4.2  HGB 11.0* 11.2* 11.3* 11.2*  HCT 32.0* 33.6* 33.6* 32.8*  PLT 213 299 244 200    COAGS: Recent Labs    04/09/22 1147  INR 1.0    BMP: Recent Labs    06/20/22 1132 07/11/22 0931 08/01/22 0834 08/22/22 0913  NA 133* 133* 133* 134*  K 4.0 3.9 3.9 3.9  CL 103 101 102 101  CO2 23 25 23 25   GLUCOSE 106* 116* 114* 102*  BUN 17 20 18 17   CALCIUM 9.0 9.3 9.1 9.1  CREATININE 0.78 0.78 0.79 0.91  GFRNONAA >60 >60 >60 >60    LIVER FUNCTION TESTS: Recent Labs    06/20/22 1132 07/11/22 0931 08/01/22 0834 08/22/22 0913  BILITOT 0.7 0.9 0.8 0.8  AST 15 19 16 22   ALT 17 20 19 16   ALKPHOS 82 88 93 219*  PROT 6.3* 6.7 6.3* 6.3*  ALBUMIN 3.7 3.8 3.8 3.7  TUMOR MARKERS: PSA = 75 on 08/22/22  increase from 19 on 03/08/22   Assessment and Plan:  [67 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  Patient demonstrates progression of disease within the bone on most recent PSMA PET scan as  well as increased PSA.  Patient is a good candidate for Lu  177 PSMA treatment (lutetium Lu 177 vipivotide tetraxetan).  Patient and wife counseled on the risks and benefits of procedure.  Primary benefit being progression-free survival.  Primary risks being marrow suppression, renal toxicity, and xerostomia.   Additionally patient counseled on radiation safety.   Patient agrees to undergo 6 cycles of Lu 177 PSMA therapy spaced 6 weeks apart.  Patient will return to oncologist 1 week prior to each therapy for patient assessment and serum labs (CBC, CMP and PSA).   Thank you for this interesting consult.  I greatly enjoyed meeting Scott [name] and look forward to participating in their care.  A copy of this report was sent to the requesting provider on this date.  Electronically Signed: Patriciaann Clan, MD 08/29/2022, 3:55 PM   I spent a total of  15 Minutes   in face to face in clinical consultation, greater than 50% of which was counseling/coordinating care for metastatic neuroendocrine tumor.

## 2022-09-02 MED ORDER — OLMESARTAN MEDOXOMIL 20 MG PO TABS: ORAL_TABLET | ORAL | 1 refills | Status: DC

## 2022-09-02 MED ORDER — NP THYROID 120 MG PO TABS: 60.0000 mg | ORAL_TABLET | Freq: Every morning | ORAL | 1 refills | Status: DC

## 2022-09-04 ENCOUNTER — Other Ambulatory Visit (HOSPITAL_COMMUNITY): Payer: Self-pay | Admitting: Hematology

## 2022-09-04 DIAGNOSIS — C61 Malignant neoplasm of prostate: Secondary | ICD-10-CM

## 2022-09-06 ENCOUNTER — Other Ambulatory Visit: Payer: Self-pay

## 2022-09-06 DIAGNOSIS — C61 Malignant neoplasm of prostate: Secondary | ICD-10-CM

## 2022-09-07 ENCOUNTER — Other Ambulatory Visit: Payer: Self-pay

## 2022-09-10 ENCOUNTER — Other Ambulatory Visit: Payer: Self-pay | Admitting: *Deleted

## 2022-09-10 MED ORDER — PROCHLORPERAZINE 25 MG RE SUPP: 25.0000 mg | Freq: Three times a day (TID) | RECTAL | 0 refills | Status: DC | PRN

## 2022-09-10 MED ORDER — TRAMADOL HCL 50 MG PO TABS: ORAL_TABLET | ORAL | 0 refills | Status: DC

## 2022-09-10 NOTE — Telephone Encounter (Signed)
Received call from wife requesting compazine suppositories rather than p.o., as he is vomiting up the majority of what he takes in p.o.  States that he has lost 11 lb in 28 days.  Offered a lab and possible fluid appointment, however they declined.  Script sent for suppositories and I will check back Wednesday morning to see if he needs Labs and fluids.  Patient agrees to plan.

## 2022-09-11 ENCOUNTER — Inpatient Hospital Stay: Payer: BC Managed Care – PPO | Admitting: Hematology

## 2022-09-11 ENCOUNTER — Telehealth: Payer: Self-pay | Admitting: *Deleted

## 2022-09-11 ENCOUNTER — Inpatient Hospital Stay: Payer: BC Managed Care – PPO

## 2022-09-11 NOTE — Telephone Encounter (Signed)
Patient called to cancel labs and fluids for tomorrow.  States he has used compazine suppositories and is having adequate control of nausea and is eating and drinking well, therefore does not feel he needs to come in.  Appointments cancelled per his request.

## 2022-09-12 ENCOUNTER — Inpatient Hospital Stay: Payer: BC Managed Care – PPO

## 2022-09-12 ENCOUNTER — Other Ambulatory Visit: Payer: Self-pay | Admitting: *Deleted

## 2022-09-12 ENCOUNTER — Other Ambulatory Visit (HOSPITAL_COMMUNITY): Payer: BC Managed Care – PPO

## 2022-09-13 ENCOUNTER — Encounter: Payer: Self-pay | Admitting: Hematology

## 2022-09-13 MED ORDER — TRAMADOL HCL 50 MG PO TABS: 100.0000 mg | ORAL_TABLET | Freq: Four times a day (QID) | ORAL | 0 refills | Status: DC | PRN

## 2022-09-14 ENCOUNTER — Inpatient Hospital Stay: Payer: BC Managed Care – PPO

## 2022-09-19 ENCOUNTER — Inpatient Hospital Stay (HOSPITAL_BASED_OUTPATIENT_CLINIC_OR_DEPARTMENT_OTHER): Payer: BC Managed Care – PPO | Admitting: Hematology

## 2022-09-19 ENCOUNTER — Inpatient Hospital Stay: Payer: BC Managed Care – PPO | Attending: Hematology | Admitting: Hematology

## 2022-09-19 ENCOUNTER — Encounter: Payer: Self-pay | Admitting: Hematology

## 2022-09-19 DIAGNOSIS — R112 Nausea with vomiting, unspecified: Secondary | ICD-10-CM | POA: Insufficient documentation

## 2022-09-19 DIAGNOSIS — Z85820 Personal history of malignant melanoma of skin: Secondary | ICD-10-CM | POA: Diagnosis not present

## 2022-09-19 DIAGNOSIS — I10 Essential (primary) hypertension: Secondary | ICD-10-CM | POA: Diagnosis not present

## 2022-09-19 DIAGNOSIS — M545 Low back pain, unspecified: Secondary | ICD-10-CM | POA: Diagnosis not present

## 2022-09-19 DIAGNOSIS — C773 Secondary and unspecified malignant neoplasm of axilla and upper limb lymph nodes: Secondary | ICD-10-CM | POA: Diagnosis not present

## 2022-09-19 DIAGNOSIS — M25552 Pain in left hip: Secondary | ICD-10-CM | POA: Insufficient documentation

## 2022-09-19 DIAGNOSIS — E559 Vitamin D deficiency, unspecified: Secondary | ICD-10-CM | POA: Diagnosis not present

## 2022-09-19 DIAGNOSIS — C61 Malignant neoplasm of prostate: Secondary | ICD-10-CM | POA: Diagnosis not present

## 2022-09-19 DIAGNOSIS — G629 Polyneuropathy, unspecified: Secondary | ICD-10-CM | POA: Insufficient documentation

## 2022-09-19 DIAGNOSIS — Z8042 Family history of malignant neoplasm of prostate: Secondary | ICD-10-CM | POA: Insufficient documentation

## 2022-09-19 DIAGNOSIS — E039 Hypothyroidism, unspecified: Secondary | ICD-10-CM | POA: Diagnosis not present

## 2022-09-19 DIAGNOSIS — Z7952 Long term (current) use of systemic steroids: Secondary | ICD-10-CM | POA: Insufficient documentation

## 2022-09-19 DIAGNOSIS — M25551 Pain in right hip: Secondary | ICD-10-CM | POA: Diagnosis not present

## 2022-09-19 DIAGNOSIS — Z87891 Personal history of nicotine dependence: Secondary | ICD-10-CM | POA: Insufficient documentation

## 2022-09-19 DIAGNOSIS — Z79899 Other long term (current) drug therapy: Secondary | ICD-10-CM | POA: Diagnosis not present

## 2022-09-19 DIAGNOSIS — Z803 Family history of malignant neoplasm of breast: Secondary | ICD-10-CM | POA: Diagnosis not present

## 2022-09-19 DIAGNOSIS — Z192 Hormone resistant malignancy status: Secondary | ICD-10-CM | POA: Diagnosis not present

## 2022-09-19 DIAGNOSIS — R232 Flushing: Secondary | ICD-10-CM | POA: Diagnosis not present

## 2022-09-19 DIAGNOSIS — N1330 Unspecified hydronephrosis: Secondary | ICD-10-CM | POA: Insufficient documentation

## 2022-09-19 DIAGNOSIS — C7951 Secondary malignant neoplasm of bone: Secondary | ICD-10-CM | POA: Diagnosis present

## 2022-09-19 DIAGNOSIS — D6481 Anemia due to antineoplastic chemotherapy: Secondary | ICD-10-CM | POA: Diagnosis not present

## 2022-09-19 DIAGNOSIS — E785 Hyperlipidemia, unspecified: Secondary | ICD-10-CM | POA: Diagnosis not present

## 2022-09-19 LAB — COMPREHENSIVE METABOLIC PANEL
ALT: 13 U/L (ref 0–44)
AST: 25 U/L (ref 15–41)
Albumin: 3.8 g/dL (ref 3.5–5.0)
Alkaline Phosphatase: 519 U/L — ABNORMAL HIGH (ref 38–126)
Anion gap: 9 (ref 5–15)
BUN: 15 mg/dL (ref 8–23)
CO2: 22 mmol/L (ref 22–32)
Calcium: 9 mg/dL (ref 8.9–10.3)
Chloride: 98 mmol/L (ref 98–111)
Creatinine, Ser: 0.85 mg/dL (ref 0.61–1.24)
GFR, Estimated: >60 mL/min (ref >60)
Glucose, Bld: 118 mg/dL — ABNORMAL HIGH (ref 70–99)
Potassium: 4 mmol/L (ref 3.5–5.1)
Sodium: 129 mmol/L — ABNORMAL LOW (ref 135–145)
Total Bilirubin: 0.8 mg/dL (ref 0.3–1.2)
Total Protein: 6.6 g/dL (ref 6.5–8.1)

## 2022-09-19 LAB — CBC WITH DIFFERENTIAL/PLATELET
Abs Immature Granulocytes: 0.02 10*3/uL (ref 0.00–0.07)
Basophils Absolute: 0.1 10*3/uL (ref 0.0–0.1)
Basophils Relative: 1 %
Eosinophils Absolute: 0.1 10*3/uL (ref 0.0–0.5)
Eosinophils Relative: 1 %
HCT: 34.4 % — ABNORMAL LOW (ref 39.0–52.0)
Hemoglobin: 11.6 g/dL — ABNORMAL LOW (ref 13.0–17.0)
Immature Granulocytes: 0 %
Lymphocytes Relative: 15 %
Lymphs Abs: 0.8 10*3/uL (ref 0.7–4.0)
MCH: 30.4 pg (ref 26.0–34.0)
MCHC: 33.7 g/dL (ref 30.0–36.0)
MCV: 90.3 fL (ref 80.0–100.0)
Monocytes Absolute: 0.4 10*3/uL (ref 0.1–1.0)
Monocytes Relative: 8 %
Neutro Abs: 4.1 10*3/uL (ref 1.7–7.7)
Neutrophils Relative %: 75 %
Platelets: 230 10*3/uL (ref 150–400)
RBC: 3.81 MIL/uL — ABNORMAL LOW (ref 4.22–5.81)
RDW: 11.7 % (ref 11.5–15.5)
WBC: 5.4 10*3/uL (ref 4.0–10.5)
nRBC: 0 % (ref 0.0–0.2)

## 2022-09-19 LAB — PSA: Prostatic Specific Antigen: 285.64 ng/mL — ABNORMAL HIGH (ref 0.00–4.00)

## 2022-09-19 NOTE — Patient Instructions (Addendum)
[facility]  Discharge Instructions  You were seen and examined today by Dr. Ellin Saba.  Dr. Ellin Saba discussed your most recent lab work and PET scan which revealed that everything looks good and stable with your labs.  Get your Pluvicto injection as scheduled.  Start taking the prednisone as prescribed once daily.   Follow-up as scheduled in 6 weeks.    Thank you for choosing [facility] to provide your oncology and hematology care.   To afford each patient quality time with our provider, please arrive at least 15 minutes before your scheduled appointment time. You may need to reschedule your appointment if you arrive late (10 or more minutes). Arriving late affects you and other patients whose appointments are after yours.  Also, if you miss three or more appointments without notifying the office, you may be dismissed from the clinic at the provider's discretion.    Again, thank you for choosing [facility].  Our hope is that these requests will decrease the amount of time that you wait before being seen by our physicians.   If you have a lab appointment with the Cancer Center - please note that after April 8th, all labs will be drawn in the cancer center.  You do not have to check in or register with the main entrance as you have in the past but will complete your check-in at the cancer center.            _____________________________________________________________  Should you have questions after your visit to [facility], please contact our office at [phone] and follow the prompts.  Our office hours are 8:00 a.m. to 4:30 p.m. Monday - Thursday and 8:00 a.m. to 2:30 p.m. Friday.  Please note that voicemails left after 4:00 p.m. may not be returned until the following business day.  We are closed weekends and all major holidays.  You do have access to a nurse 24-7, just call the main number to the  clinic [phone] and do not press any options, hold on the line and a nurse will answer the phone.    For prescription refill requests, have your pharmacy contact our office and allow 72 hours.    Masks are no longer required in the cancer centers. If you would like for your care team to wear a mask while they are taking care of you, please let them know. You may have one support person who is at least 65 years old accompany you for your appointments.

## 2022-09-19 NOTE — Progress Notes (Signed)
[facility] [address], [place] [address]    Clinic Day:  09/28/2022  Referring physician: Etta Grandchild, MD  Patient Care Team: Etta Grandchild, MD as PCP - General (Internal Medicine) Doreatha Massed, MD as Consulting Physician (Hematology and Oncology) Jena Gauss Gerrit Friends, MD as Consulting Physician (Gastroenterology) Therese Sarah, RN as Oncology Nurse Navigator (Medical Oncology)   ASSESSMENT & PLAN:   Assessment: 1.  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  Prominent left hydronephrosis and hydroureter noted.  New sclerotic lesion in the left anterior vertebral body of L1.  Mild left periaortic adenopathy increased from prior, short axis lymph node measuring 1 cm. -Last PSA increased to 15.8 on 09/28/2019. -As there is rapid progression of disease, I have recommended docetaxel chemotherapy.  He is agreeable after long discussion. -Cystoscopy with transurethral resection of tumor in the posterior bladder neck, right and left walls of the bladder, right lobe of the prostate on 11/10/2019. -Plan was to do 6 cycles of docetaxel followed by abiraterone and prednisone. -6 cycles of docetaxel from 11/16/2019 through 02/29/2020. -CTAP on 01/06/2020 showed persistent thickening of the urinary bladder wall.  Left hydronephrosis is significantly reduced compared to prior exam.  Left-sided retroperitoneal and left iliac lymph nodes reduced in size.  Interval increase in sclerosis of lesions of the left aspect of L2 vertebral body consistent with treatment response.  No new bone lesions. -Bone scan on 01/06/2020 shows stable L2 metastatic focus. -Bone scan scan images from 06/14/2020 which showed progressive metastatic disease, with increased size and intensity of L2 1 new focus of activity at  L1. 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 -Abiraterone and prednisone from 06/24/2020 through 09/06/2021 with progression. - PSMA PET scan (09/28/2021): Residual/recurrent tumor involving prostate gland and seminal vesicles.  Tracer avid bone metastasis localized L1 and L2 vertebral bodies.  No new sites of bone metastasis disease.  No solid organ or nodal mets. - Darolutamide from 10/17/2021 through 03/15/2022 with progression - Guardant360: MSI high not detected.  Androgen resistance present. - XRT to L2-L4, 30 Gray in 10 fractions completed on 04/23/2022 with improvement in pain. - Cabazitaxel 4 cycles from 05/07/2022 through 07/11/2022 with progression.   2.  Left hydroureteronephrosis: -CT scan showed prostate mass invading the posterior bladder and obstructing the ureter.  Creatinine increased to 1.1. -Renal ultrasound on 12/11/2019 showed interval resolution of previously seen left-sided hydronephrosis.  Previously seen soft tissue mass at the posterior bladder lumen no longer seen.    Plan: 1.  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  Overall worsening disease. - PSA is gradually worsening. - Labs today: CBC grossly normal with mild anemia.  Alk phos is elevated and rest of LFTs are normal.  Creatinine is normal. - He will proceed with Pluvicto first dose on 09/26/2022.  We discussed side effects in detail. - He will come back 1 week prior to second dose.   2.  Hot flashes: - Continue estradiol twice daily for hot flashes.   3.  Peripheral neuropathy: - Tingling in the left foot and fingertips is stable.   4.  Lower back pain/bilateral hip pains: - He reported worsening pain in the low back pain and bilateral hip pain. - Will increase tramadol to 100 mg  twice daily and hydrocodone at bedtime. - Will start him on prednisone 10 mg daily.  5.  Nausea: - Continue Compazine suppository every 8 hours as needed. - He lost  about 9 pounds due to decreased appetite and feeling queasy.    Orders Placed This Encounter  Procedures   CBC with Differential/Platelet    Standing Status:   Future    Standing Expiration Date:   09/19/2023    Order Specific Question:   Release to patient    Answer:   Immediate   Comprehensive metabolic panel    Standing Status:   Future    Standing Expiration Date:   09/19/2023    Order Specific Question:   Release to patient    Answer:   Immediate   Magnesium    Standing Status:   Future    Standing Expiration Date:   09/19/2023    Order Specific Question:   Release to patient    Answer:   Immediate     I,Alexis Herring,acting as a scribe for Doreatha Massed, MD.,have documented all relevant documentation on the behalf of Doreatha Massed, MD,as directed by  Doreatha Massed, MD while in the presence of Doreatha Massed, MD.   I, Doreatha Massed MD, have reviewed the above documentation for accuracy and completeness, and I agree with the above.    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    Prior Therapy: 1. TURP on 11/10/2019. 2. Docetaxel x 6 cycles, 11/16/2019 - 02/29/2020. 3.  Abiraterone and prednisone, 06/24/20 - 09/06/21 4.  Darolutamide, 10/17/21 - 03/15/22  Current Therapy:  Cabazitaxel every 21 days    HISTORY OF PRESENT ILLNESS:   Oncology History  Prostate cancer metastatic to multiple sites [facility])  01/25/2017 Initial Diagnosis   Prostate cancer metastatic to multiple sites [facility])   11/16/2019 - 03/02/2020 Chemotherapy   Patient is on Treatment Plan : PROSTATE Docetaxel + Prednisone q21d      Genetic Testing   Negative genetic testing. No pathogenic variants identified on the [facility] CancerNext-Expanded panel. 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 The report date is  04/19/2020.  The CancerNext-Expanded  gene panel offered by [facility] and includes sequencing and rearrangement analysis for the following 77 genes: IP, ALK, APC*, ATM*, AXIN2, BAP1, BARD1, BLM, BMPR1A, BRCA1*, BRCA2*, BRIP1*, CDC73, CDH1*,CDK4, CDKN1B, CDKN2A, CHEK2*, CTNNA1, DICER1, FANCC, FH, FLCN, GALNT12, KIF1B, LZTR1, MAX, MEN1, MET, MLH1*, MSH2*, MSH3, MSH6*, MUTYH*, NBN, NF1*, NF2, NTHL1, PALB2*, PHOX2B, PMS2*, POT1, PRKAR1A, PTCH1, PTEN*, RAD51C*, RAD51D*,RB1, RECQL, RET, SDHA, SDHAF2, SDHB, SDHC, SDHD, SMAD4, SMARCA4, SMARCB1, SMARCE1, STK11, SUFU, TMEM127, TP53*,TSC1, TSC2, VHL and XRCC2 (sequencing and deletion/duplication); EGFR, EGLN1, HOXB13, KIT, MITF, PDGFRA, POLD1 and POLE (sequencing only); EPCAM and GREM1 (deletion/duplication only).    05/07/2022 -  Chemotherapy   Patient is on Treatment Plan : PROSTATE Cabazitaxel (20) D1 + Prednisone D1-21 q21d        INTERVAL HISTORY:   Scott [name] is a 67 y.o. 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 He was last seen by me on 07/11/22.  He has no appetite level. His energy level is at 25%.  Patient notes he is taking compazine suppositories around the clock for his nausea. He reports a 9 pound wight loss since our last visit. He notes a decrease in appetite.  Patient's wife notes that Zofran did not improve vomiting, but compazine improves vomiting most of the time. He reports that his diet is down to a few foods.    He reports persistent pain of both hips, starting in the right and radiating to his lower back. He notes that sitting worsens the pain. Patient notes he took Prednisone 10mg  one time which improved his pain. He has increased his pain medication to Tramadol 100mg  q6h and Norco 5 q8h.  He complains of mild neuropathy in the left foot. This is unchanged since last visit.  PAST MEDICAL HISTORY:   Past Medical History: Past Medical History:  Diagnosis Date    Bladder obstruction    Complication of anesthesia    Diverticulitis 2015   Dyspnea    Essential hypertension, benign 01/26/2019   at doctor's office elevated, normal at home, no medications at this time   History of basal cell cancer    HLD (hyperlipidemia) 01/26/2019   Hypothyroidism    history of, not currently taking medication   PONV (postoperative nausea and vomiting)    Prostate cancer (HCC)    metastatic to bones   Vitamin D deficiency disease 01/26/2019    Surgical History: Past Surgical History:  Procedure Laterality Date   CYSTOSCOPY W/ URETERAL STENT PLACEMENT Left 11/10/2019   Procedure: CYSTOSCOPY;  Surgeon: Bjorn Pippin, MD;  Location: [facility];  Service: Urology;  Laterality: Left;   ESOPHAGOGASTRODUODENOSCOPY (EGD) WITH PROPOFOL N/A 07/07/2020   Procedure: ESOPHAGOGASTRODUODENOSCOPY (EGD) WITH PROPOFOL;  Surgeon: Corbin Ade, MD;  Location: [facility];  Service: Endoscopy;  Laterality: N/A;  pm appt   HERNIA REPAIR  1995   UMBILICAL    IR FLUORO GUIDED NEEDLE PLC ASPIRATION/INJECTION LOC  04/09/2022   MALONEY DILATION N/A 07/07/2020   Procedure: MALONEY DILATION;  Surgeon: Corbin Ade, MD;  Location: [facility];  Service: Endoscopy;  Laterality: N/A;   PROSTATE SURGERY     SKIN CANCER EXCISION     TONSILLECTOMY Bilateral    TOOTH EXTRACTION     front left tooth pulled but no surgery or stitches   TRANSURETHRAL RESECTION OF PROSTATE N/A 01/25/2017   Procedure: TRANSURETHRAL RESECTION OF THE PROSTATE (TURP);  Surgeon: Sebastian Ache, MD;  Location: [facility];  Service: Urology;  Laterality: N/A;   TRANSURETHRAL RESECTION OF PROSTATE N/A 11/10/2019   Procedure: TRANSURETHRAL RESECTION OF THE PROSTATE (TURP);  Surgeon: Bjorn Pippin, MD;  Location: [facility];  Service: Urology;  Laterality: N/A;   WISDOM TOOTH EXTRACTION      Social History: Social History   Socioeconomic History   Marital status: Married    Spouse name: Not on file   Number of children: 1    Years of education: Not on file   Highest education level: Bachelor's degree (e.g., BA, AB, BS)  Occupational History    Comment: working full time  Tobacco Use   Smoking status: Former    Current packs/day: 0.00    Average packs/day: 1.5 packs/day for 35.0 years (52.5 ttl pk-yrs)    Types: Cigarettes    Start date: 01/11/1969    Quit date: 01/12/2004    Years since quitting: 18.7   Smokeless tobacco: Never   Tobacco comments:    OVER 30 YEARS HX OF SMOKING   Vaping Use   Vaping status: Never Used  Substance and Sexual Activity   Alcohol use: Not Currently    Comment: heavy drinker until 2004   Drug use: Not Currently  Types: Marijuana   Sexual activity: Not Currently  Other Topics Concern   Not on file  Social History Narrative   Married for 19 years.Works for [employer] from home.   Social Determinants of Health   Financial Resource Strain: Low Risk  (08/24/2022)   Overall Financial Resource Strain (CARDIA)    Difficulty of Paying Living Expenses: Not very hard  Food Insecurity: No Food Insecurity (08/24/2022)   Hunger Vital Sign    Worried About Running Out of Food in the Last Year: Never true    Ran Out of Food in the Last Year: Never true  Transportation Needs: No Transportation Needs (08/24/2022)   PRAPARE - Administrator, Civil Service (Medical): No    Lack of Transportation (Non-Medical): No  Physical Activity: Insufficiently Active (08/24/2022)   Exercise Vital Sign    Days of Exercise per Week: 5 days    Minutes of Exercise per Session: 20 min  Stress: Stress Concern Present (08/24/2022)   [employer] of Occupational Health - Occupational Stress Questionnaire    Feeling of Stress : To some extent  Social Connections: Socially Isolated (08/24/2022)   Social Connection and Isolation Panel [NHANES]    Frequency of Communication with Friends and Family: Once a week    Frequency of Social Gatherings with Friends and Family: Once a week     Attends Religious Services: Never    Database administrator or Organizations: No    Attends Engineer, structural: Not on file    Marital Status: Married  Catering manager Violence: Not At Risk (03/02/2020)   Humiliation, Afraid, Rape, and Kick questionnaire    Fear of Current or Ex-Partner: No    Emotionally Abused: No    Physically Abused: No    Sexually Abused: No    Family History: Family History  Problem Relation Age of Onset   Emphysema Mother    Alzheimer's disease Father    Prostate cancer Father        dx in his 62s   Prostate cancer Brother        dx in his 34s   Breast cancer Maternal Grandmother        60s   Pancreatic cancer Maternal Grandfather        79s   Dementia Paternal Grandmother    Heart attack Paternal Grandfather    Colon cancer Neg Hx     Current Medications:  Current Outpatient Medications:    arginine 500 MG tablet, Take 500 mg by mouth 2 (two) times daily., Disp: , Rfl:    Cholecalciferol 125 MCG (5000 UT) capsule, Take 5,000-10,000 Units by mouth See admin instructions. Take [address] units in the morning and 5000 units at night, Disp: , Rfl:    DANDELION ROOT PO, Take 500 mg by mouth daily., Disp: , Rfl:    dutasteride (AVODART) 0.5 MG capsule, Take 1 capsule (0.5 mg total) by mouth daily., Disp: 90 capsule, Rfl: 3   estradiol (VIVELLE-DOT) 0.1 MG/24HR patch, APPLY 1 PATCH(0.1 MG) TOPICALLY TO THE SKIN 2 TIMES A WEEK, Disp: 8 patch, Rfl: 12   MAGNESIUM PO, Take 1 tablet by mouth at bedtime., Disp: , Rfl:    MELATONIN PO, Take 40 mg by mouth at bedtime., Disp: , Rfl:    MILK THISTLE PO, Take 1 capsule by mouth daily., Disp: , Rfl:    NATTOKINASE PO, Take 2 capsules by mouth daily., Disp: , Rfl:    NP THYROID 120 MG tablet,  Take 0.5 tablets (60 mg total) by mouth every morning., Disp: 45 tablet, Rfl: 1   olmesartan (BENICAR) 20 MG tablet, TAKE 1 TABLET(20 MG) BY MOUTH DAILY, Disp: 90 tablet, Rfl: 1   ondansetron (ZOFRAN ODT) 8 MG  disintegrating tablet, Take 1 tablet (8 mg total) by mouth every 8 (eight) hours as needed for nausea or vomiting., Disp: 60 tablet, Rfl: 3   predniSONE (DELTASONE) 10 MG tablet, Take 1 tablet (10 mg total) by mouth daily., Disp: 30 tablet, Rfl: 3   predniSONE (DELTASONE) 5 MG tablet, Take 1 tablet (5 mg total) by mouth daily with breakfast., Disp: 30 tablet, Rfl: 5   pyridOXINE (VITAMIN B6) 25 MG tablet, Take 25 mg by mouth daily., Disp: , Rfl:    QUERCETIN PO, Take 1 tablet by mouth 2 (two) times daily., Disp: , Rfl:    thiamine (VITAMIN B-1) 50 MG tablet, Take 1 tablet (50 mg total) by mouth daily., Disp: 90 tablet, Rfl: 1   traMADol (ULTRAM) 50 MG tablet, Take 2 tablets (100 mg total) by mouth every 6 (six) hours as needed., Disp: 120 tablet, Rfl: 0   VITAMIN K PO, Take 1 capsule by mouth every other day., Disp: , Rfl:    Zinc 30 MG CAPS, Take 30 mg by mouth daily., Disp: , Rfl:    dronabinol (MARINOL) 5 MG capsule, Take 1 capsule (5 mg total) by mouth 2 (two) times daily before a meal., Disp: 60 capsule, Rfl: 0   HYDROcodone-acetaminophen (NORCO) 5-325 MG tablet, Take 1 tablet by mouth every 8 (eight) hours as needed for moderate pain., Disp: 30 tablet, Rfl: 0   prochlorperazine (COMPAZINE) 25 MG suppository, Place 1 suppository (25 mg total) rectally every 8 (eight) hours as needed for nausea or vomiting., Disp: 24 suppository, Rfl: 0 No current facility-administered medications for this visit.  Facility-Administered Medications Ordered in Other Visits:    sodium chloride 0.9 % bolus 1,000 mL, 1,000 mL, Intravenous, Once, Lorna Few, MD   Allergies: Allergies  Allergen Reactions   Crestor [Rosuvastatin] Other (See Comments)    myalgia   Ivp Dye [Iodinated Contrast Media] Swelling    Swelling on head, took Benadryl and swelling reduced   Wellbutrin [Bupropion] Hives    REVIEW OF SYSTEMS:   Review of Systems  Constitutional:  Positive for unexpected weight change. Negative for  chills, fatigue and fever.  HENT:   Positive for nosebleeds. Negative for lump/mass, mouth sores, sore throat and trouble swallowing.   Eyes:  Negative for eye problems.  Respiratory:  Negative for cough and shortness of breath.   Cardiovascular:  Negative for chest pain, leg swelling and palpitations.  Gastrointestinal:  Positive for constipation, nausea and vomiting (not as bad since starting compazine suppositories). Negative for abdominal pain and diarrhea.  Genitourinary:  Positive for difficulty urinating (slow flow). Negative for bladder incontinence, dysuria, frequency, hematuria and nocturia.   Musculoskeletal:  Positive for arthralgias (right hip, 6/10 severity) and back pain. Negative for flank pain, myalgias and neck pain.  Skin:  Negative for itching and rash.  Neurological:  Positive for numbness (left foot). Negative for dizziness and headaches.  Hematological:  Does not bruise/bleed easily.  Psychiatric/Behavioral:  Negative for depression, sleep disturbance and suicidal ideas. The patient is not nervous/anxious.   All other systems reviewed and are negative.    VITALS:   Blood pressure 115/73, pulse 65, temperature 97.9 F (36.6 C), temperature source Oral, resp. rate 18, weight 161 lb 11.2 oz (73.3 kg), SpO2  97%.  Wt Readings from Last 3 Encounters:  09/19/22 161 lb 11.2 oz (73.3 kg)  08/27/22 170 lb (77.1 kg)  08/22/22 171 lb 3.2 oz (77.7 kg)    Body mass index is 26.1 kg/m.  Performance status (ECOG): 1 - Symptomatic but completely ambulatory  PHYSICAL EXAM:   Physical Exam Vitals and nursing note reviewed. Exam conducted with a chaperone present.  Constitutional:      Appearance: Normal appearance.  Cardiovascular:     Rate and Rhythm: Normal rate and regular rhythm.     Pulses: Normal pulses.     Heart sounds: Normal heart sounds.  Pulmonary:     Effort: Pulmonary effort is normal.     Breath sounds: Normal breath sounds.  Abdominal:     Palpations:  Abdomen is soft. There is no hepatomegaly, splenomegaly or mass.     Tenderness: There is no abdominal tenderness.  Musculoskeletal:     Right lower leg: No edema.     Left lower leg: No edema.  Lymphadenopathy:     Cervical: No cervical adenopathy.     Right cervical: No superficial, deep or posterior cervical adenopathy.    Left cervical: No superficial, deep or posterior cervical adenopathy.     Upper Body:     Right upper body: No supraclavicular or axillary adenopathy.     Left upper body: No supraclavicular or axillary adenopathy.  Neurological:     General: No focal deficit present.     Mental Status: He is alert and oriented to person, place, and time.  Psychiatric:        Mood and Affect: Mood normal.        Behavior: Behavior normal.     LABS:      Latest Ref Rng & Units 09/28/2022    9:25 AM 09/19/2022    1:21 PM 08/22/2022    9:13 AM  CBC  WBC 4.0 - 10.5 K/uL 5.9  5.4  4.2   Hemoglobin 13.0 - 17.0 g/dL 16.1  09.6  04.5   Hematocrit 39.0 - 52.0 % 36.7  34.4  32.8   Platelets 150 - 400 K/uL 218  230  200       Latest Ref Rng & Units 09/28/2022    9:25 AM 09/19/2022    1:21 PM 08/22/2022    9:13 AM  CMP  Glucose 70 - 99 mg/dL 409  811  914   BUN 8 - 23 mg/dL 12  15  17    Creatinine 0.61 - 1.24 mg/dL 7.82  9.56  2.13   Sodium 135 - 145 mmol/L 129  129  134   Potassium 3.5 - 5.1 mmol/L 3.3  4.0  3.9   Chloride 98 - 111 mmol/L 96  98  101   CO2 22 - 32 mmol/L 21  22  25    Calcium 8.9 - 10.3 mg/dL 9.0  9.0  9.1   Total Protein 6.5 - 8.1 g/dL 6.9  6.6  6.3   Total Bilirubin 0.3 - 1.2 mg/dL 1.0  0.8  0.8   Alkaline Phos 38 - 126 U/L 937  519  219   AST 15 - 41 U/L 37  25  22   ALT 0 - 44 U/L 18  13  16       No results found for: "CEA1", "CEA" / No results found for: "CEA1", "CEA" Lab Results  Component Value Date   PSA1 12.9 (H) 05/26/2020   No results found for: "YQM578" No  results found for: "CAN125"  No results found for: "TOTALPROTELP", "ALBUMINELP",  "A1GS", "A2GS", "BETS", "BETA2SER", "GAMS", "MSPIKE", "SPEI" Lab Results  Component Value Date   TIBC 336.0 01/09/2021   TIBC 280 04/20/2020   FERRITIN 69.8 01/09/2021   FERRITIN 45 04/20/2020   IRONPCTSAT 45.2 01/09/2021   IRONPCTSAT 31 04/20/2020   No results found for: "LDH"   STUDIES:   NM PLUVICTO ADMINISTRATION  Result Date: 09/26/2022 CLINICAL DATA:  67 year old male with castrate resistant prostate adenocarcinoma. Progression of bone metastasis on recent PSMA PET scan (08/16/2022) EXAM: NUCLEAR MEDICINE PLUVICTO INJECTION TECHNIQUE: Infusion: The nuclear medicine technologist and I personally verified the dose activity to be delivered as specified in the written directive, and verified the patient identification via 2 separate methods. Initial flush of the intravenous catheter was performed was sterile saline. The dose syringe was connected to the catheter and the Lu-177 Pluvicto administered over a 1 to 10 min infusion. Single 10 cc lushes with normal saline follow the dose. No complications were noted. The entire IV tubing, venocatheter, stopcock and syringes was removed in total, placed in a disposal bag and sent for assay of the residual activity, which will be reported at a later time in our EMR by the physics staff. Pressure was applied to the venipuncture site, and a compression bandage placed. Patient monitored for 1 hour following infusion. Radiation Safety personnel were present to perform the discharge survey, as detailed on their documentation. After a short period of observation, the patient had his IV removed. RADIOPHARMACEUTICALS:  202 microcuries Lu-177 PLUVICTO FINDINGS: Current Infusion: 1 Planned Infusions: 6 Patient presented to nuclear medicine for treatment. The patient's most recent blood counts were reviewed and remains a good candidate to proceed with Lu-177 Pluvicto. PSA equal 285 increased from 75 one month prior Alkaline phosphatase increased to 518. Normal renal  function. Minimal anemia related to prior chemotherapy (hemoglobin equal 11.6. Normal platelets. Patient reports decreased appetite and weight loss. The patient was situated in an infusion suite with a contact barrier placed under the arm. Intravenous access was established, using sterile technique, and a normal saline infusion from a syringe was started. Micro-dosimetry: The prescribed radiation activity was assayed and confirmed to be within specified tolerance. IMPRESSION: Current Infusion: 1 Planned Infusions: 6 Patient reports significant decreased appetite and weight loss. The patient tolerated the infusion well. The patient will return in 6 weeks for ongoing care. Electronically Signed   By: Genevive Bi M.D.   On: 09/26/2022 10:13

## 2022-09-21 ENCOUNTER — Other Ambulatory Visit: Payer: Self-pay | Admitting: Physician Assistant

## 2022-09-21 ENCOUNTER — Other Ambulatory Visit: Payer: Self-pay | Admitting: *Deleted

## 2022-09-21 DIAGNOSIS — C61 Malignant neoplasm of prostate: Secondary | ICD-10-CM

## 2022-09-21 MED ORDER — HYDROCODONE-ACETAMINOPHEN 5-325 MG PO TABS
1.0000 | ORAL_TABLET | Freq: Three times a day (TID) | ORAL | 0 refills | Status: DC | PRN
Start: 2022-09-21 — End: 2022-10-22

## 2022-09-21 MED ORDER — PROCHLORPERAZINE 25 MG RE SUPP
25.0000 mg | Freq: Three times a day (TID) | RECTAL | 0 refills | Status: DC | PRN
Start: 2022-09-21 — End: 2022-10-05

## 2022-09-21 NOTE — Written Directive (Addendum)
  PLUVICTO  THERAPY   RADIOPHARMACEUTICAL: Lutetium 177 vipivotide tetraxetan (Pluvicto)     PRESCRIBED DOSE FOR ADMINISTRATION:  200 mCi   ROUTE OFADMINISTRATION:  IV   DIAGNOSIS:  Metastatic prostate cancer   REFERRING PHYSICIAN: Katragadda   TREATMENT #: 1   ADDITIONAL PHYSICIAN COMMENTS/NOTES:   AUTHORIZED USER SIGNATURE & TIME STAMP: Patriciaann Clan, MD   09/21/22    6:39 PM

## 2022-09-25 ENCOUNTER — Other Ambulatory Visit (HOSPITAL_COMMUNITY): Payer: Self-pay | Admitting: Hematology

## 2022-09-25 DIAGNOSIS — C61 Malignant neoplasm of prostate: Secondary | ICD-10-CM

## 2022-09-25 DIAGNOSIS — C7951 Secondary malignant neoplasm of bone: Secondary | ICD-10-CM

## 2022-09-26 ENCOUNTER — Other Ambulatory Visit: Payer: Self-pay | Admitting: *Deleted

## 2022-09-26 ENCOUNTER — Other Ambulatory Visit (HOSPITAL_COMMUNITY): Payer: BC Managed Care – PPO

## 2022-09-26 ENCOUNTER — Ambulatory Visit (HOSPITAL_COMMUNITY)
Admission: RE | Admit: 2022-09-26 | Discharge: 2022-09-26 | Disposition: A | Discharge status: Home / Self Care | Payer: BC Managed Care – PPO | Source: Ambulatory Visit | Attending: Hematology | Admitting: Hematology

## 2022-09-26 DIAGNOSIS — C61 Malignant neoplasm of prostate: Secondary | ICD-10-CM | POA: Insufficient documentation

## 2022-09-26 DIAGNOSIS — C7951 Secondary malignant neoplasm of bone: Secondary | ICD-10-CM | POA: Diagnosis present

## 2022-09-26 MED ORDER — LUTETIUM LU 177 VIPIVOTIDE TET 1000 MBQ/ML IV SOLN
202.0000 | Freq: Once | INTRAVENOUS | Status: AC
  Administered 2022-09-26: 202 via INTRAVENOUS

## 2022-09-26 MED ORDER — DRONABINOL 5 MG PO CAPS: 5.0000 mg | ORAL_CAPSULE | Freq: Two times a day (BID) | ORAL | 0 refills | Status: DC

## 2022-09-26 MED ORDER — SODIUM CHLORIDE 0.9 % IV BOLUS: 1000.0000 mL | Freq: Once | INTRAVENOUS | Status: DC

## 2022-09-26 MED ORDER — SODIUM CHLORIDE 0.9 % IV SOLN: INTRAVENOUS | Status: AC

## 2022-09-26 NOTE — Progress Notes (Signed)
Dr. Ellin Saba aware of PSA.  No new orders noted.

## 2022-09-26 NOTE — Progress Notes (Signed)
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  Progression of bone metastasis on recent PSMA PET scan (08/16/2022)     EXAM: NUCLEAR MEDICINE PLUVICTO INJECTION  TECHNIQUE: Infusion: The nuclear medicine technologist and I personally verified the dose activity to be delivered as specified in the written directive, and verified the patient identification via 2 separate methods.  Initial flush of the intravenous catheter was performed was sterile saline. The dose syringe was connected to the catheter and the Lu-177 Pluvicto administered over a 1 to 10 min infusion. Single 10 cc  lushes with normal saline follow the dose. No complications were noted. The entire IV tubing, venocatheter, stopcock and syringes was removed in total, placed in a disposal bag and sent for assay of the residual activity, which will be reported at a later time in our EMR by the physics staff. Pressure was applied to the venipuncture site, and a compression bandage placed. Patient monitored for 1 hour following infusion.    Radiation Safety personnel were present to perform the discharge survey, as detailed on their documentation. After a short period of observation, the patient had his IV removed.  RADIOPHARMACEUTICALS: [202] microcuries Lu-177 PLUVICTO  FINDINGS: Current Infusion: [1]  Planned Infusions: 6    Patient presented to nuclear medicine for treatment. The patient's most recent blood counts were reviewed and remains a good candidate to proceed with Lu-177 Pluvicto.    PSA equal 285 increased from 75 one month prior     Alkaline phosphatase increased  to 518.     Normal renal function.  Minimal anemia related to prior chemotherapy (hemoglobin equal 11.6.  Normal platelets.     Patient reports decreased appetite and weight loss.     The patient was situated in an infusion suite with a contact barrier placed under the arm. Intravenous access was  established, using sterile technique, and a normal saline infusion from a syringe was started.     Micro-dosimetry: The prescribed radiation activity was assayed and confirmed to be within specified tolerance.  IMPRESSION: Current Infusion: [1]  Planned Infusions: 6   Patient reports significant decreased appetite and weight loss.    [The patient tolerated the infusion well. The patient will return in 6 weeks for ongoing care.]

## 2022-09-27 ENCOUNTER — Encounter: Payer: Self-pay | Admitting: Hematology

## 2022-09-27 ENCOUNTER — Telehealth: Payer: Self-pay | Admitting: *Deleted

## 2022-09-27 NOTE — Telephone Encounter (Signed)
Wife called to advise that patient began vomiting on the way home from treatment yesterday and continued throughout the evening.  States that he has vomited x 7 today.  Has adequate urine output.  Is extremely weak.  Put on schedule for PF labs and house fluids for 9:30 tomorrow.

## 2022-09-28 ENCOUNTER — Inpatient Hospital Stay: Payer: BC Managed Care – PPO

## 2022-09-28 ENCOUNTER — Encounter: Payer: Self-pay | Admitting: Hematology

## 2022-09-28 VITALS — BP 132/74 | HR 83 | Temp 97.8°F | Resp 18

## 2022-09-28 DIAGNOSIS — C61 Malignant neoplasm of prostate: Secondary | ICD-10-CM

## 2022-09-28 DIAGNOSIS — C7951 Secondary malignant neoplasm of bone: Secondary | ICD-10-CM | POA: Diagnosis not present

## 2022-09-28 LAB — COMPREHENSIVE METABOLIC PANEL
ALT: 18 U/L (ref 0–44)
AST: 37 U/L (ref 15–41)
Albumin: 3.9 g/dL (ref 3.5–5.0)
Alkaline Phosphatase: 937 U/L — ABNORMAL HIGH (ref 38–126)
Anion gap: 12 (ref 5–15)
BUN: 12 mg/dL (ref 8–23)
CO2: 21 mmol/L — ABNORMAL LOW (ref 22–32)
Calcium: 9 mg/dL (ref 8.9–10.3)
Chloride: 96 mmol/L — ABNORMAL LOW (ref 98–111)
Creatinine, Ser: 0.85 mg/dL (ref 0.61–1.24)
GFR, Estimated: >60 mL/min (ref >60)
Glucose, Bld: 105 mg/dL — ABNORMAL HIGH (ref 70–99)
Potassium: 3.3 mmol/L — ABNORMAL LOW (ref 3.5–5.1)
Sodium: 129 mmol/L — ABNORMAL LOW (ref 135–145)
Total Bilirubin: 1 mg/dL (ref 0.3–1.2)
Total Protein: 6.9 g/dL (ref 6.5–8.1)

## 2022-09-28 LAB — CBC WITH DIFFERENTIAL/PLATELET
Abs Immature Granulocytes: 0.04 10*3/uL (ref 0.00–0.07)
Basophils Absolute: 0 10*3/uL (ref 0.0–0.1)
Basophils Relative: 1 %
Eosinophils Absolute: 0 10*3/uL (ref 0.0–0.5)
Eosinophils Relative: 1 %
HCT: 36.7 % — ABNORMAL LOW (ref 39.0–52.0)
Hemoglobin: 12.8 g/dL — ABNORMAL LOW (ref 13.0–17.0)
Immature Granulocytes: 1 %
Lymphocytes Relative: 11 %
Lymphs Abs: 0.7 10*3/uL (ref 0.7–4.0)
MCH: 31.1 pg (ref 26.0–34.0)
MCHC: 34.9 g/dL (ref 30.0–36.0)
MCV: 89.1 fL (ref 80.0–100.0)
Monocytes Absolute: 0.5 10*3/uL (ref 0.1–1.0)
Monocytes Relative: 8 %
Neutro Abs: 4.6 10*3/uL (ref 1.7–7.7)
Neutrophils Relative %: 78 %
Platelets: 218 10*3/uL (ref 150–400)
RBC: 4.12 MIL/uL — ABNORMAL LOW (ref 4.22–5.81)
RDW: 11.9 % (ref 11.5–15.5)
WBC: 5.9 10*3/uL (ref 4.0–10.5)
nRBC: 0 % (ref 0.0–0.2)

## 2022-09-28 LAB — PSA: Prostatic Specific Antigen: 520.98 ng/mL — ABNORMAL HIGH (ref 0.00–4.00)

## 2022-09-28 MED ORDER — ONDANSETRON HCL 4 MG/2ML IJ SOLN
8.0000 mg | Freq: Once | INTRAMUSCULAR | Status: AC
  Administered 2022-09-28: 8 mg via INTRAVENOUS
  Filled 2022-09-28: qty 4

## 2022-09-28 MED ORDER — MAGNESIUM SULFATE 2 GM/50ML IV SOLN
2.0000 g | Freq: Once | INTRAVENOUS | Status: AC
  Administered 2022-09-28: 2 g via INTRAVENOUS
  Filled 2022-09-28: qty 50

## 2022-09-28 MED ORDER — POTASSIUM CHLORIDE CRYS ER 20 MEQ PO TBCR
40.0000 meq | EXTENDED_RELEASE_TABLET | Freq: Once | ORAL | Status: AC
  Administered 2022-09-28: 40 meq via ORAL
  Filled 2022-09-28: qty 2

## 2022-09-28 MED ORDER — POTASSIUM CHLORIDE IN NACL 20-0.9 MEQ/L-% IV SOLN
Freq: Once | INTRAVENOUS | Status: AC
  Filled 2022-09-28: qty 1000

## 2022-09-28 NOTE — Patient Instructions (Signed)
[facility] [place]  Discharge Instructions: Thank you for choosing [facility] to provide your oncology and hematology care.  If you have a lab appointment with the Cancer Center - please note that after April 8th, 2024, all labs will be drawn in the cancer center.  You do not have to check in or register with the main entrance as you have in the past but will complete your check-in in the cancer center.  Wear comfortable clothing and clothing appropriate for easy access to any Portacath or PICC line.   We strive to give you quality time with your provider. You may need to reschedule your appointment if you arrive late (15 or more minutes).  Arriving late affects you and other patients whose appointments are after yours.  Also, if you miss three or more appointments without notifying the office, you may be dismissed from the clinic at the provider's discretion.      For prescription refill requests, have your pharmacy contact our office and allow 72 hours for refills to be completed.    Today you received the following, hydration fluids and magnesium    To help prevent nausea and vomiting after your treatment, we encourage you to take your nausea medication as directed.  BELOW ARE SYMPTOMS THAT SHOULD BE REPORTED IMMEDIATELY: *FEVER GREATER THAN 100.4 F (38 C) OR HIGHER *CHILLS OR SWEATING *NAUSEA AND VOMITING THAT IS NOT CONTROLLED WITH YOUR NAUSEA MEDICATION *UNUSUAL SHORTNESS OF BREATH *UNUSUAL BRUISING OR BLEEDING *URINARY PROBLEMS (pain or burning when urinating, or frequent urination) *BOWEL PROBLEMS (unusual diarrhea, constipation, pain near the anus) TENDERNESS IN MOUTH AND THROAT WITH OR WITHOUT PRESENCE OF ULCERS (sore throat, sores in mouth, or a toothache) UNUSUAL RASH, SWELLING OR PAIN  UNUSUAL VAGINAL DISCHARGE OR ITCHING   Items with * indicate a potential emergency and should be followed up as soon as possible or go to the Emergency Department  if any problems should occur.  Please show the CHEMOTHERAPY ALERT CARD or IMMUNOTHERAPY ALERT CARD at check-in to the Emergency Department and triage nurse.  Should you have questions after your visit or need to cancel or reschedule your appointment, please contact [facility] [place] [phone]  and follow the prompts.  Office hours are 8:00 a.m. to 4:30 p.m. Monday - Friday. Please note that voicemails left after 4:00 p.m. may not be returned until the following business day.  We are closed weekends and major holidays. You have access to a nurse at all times for urgent questions. Please call the main number to the clinic [phone] and follow the prompts.  For any non-urgent questions, you may also contact your provider using MyChart. We now offer e-Visits for anyone 18 and older to request care online for non-urgent symptoms. For details visit [email or web address].   Also download the MyChart app! Go to the app store, search "MyChart", open the app, select [place], and log in with your MyChart username and password.   

## 2022-09-28 NOTE — Progress Notes (Signed)
Hydration fluids given per orders. Patient tolerated it well without problems. Vitals stable and discharged home from clinic ambulatory. Follow up as scheduled.  

## 2022-10-01 ENCOUNTER — Other Ambulatory Visit: Payer: Self-pay

## 2022-10-01 ENCOUNTER — Inpatient Hospital Stay: Payer: BC Managed Care – PPO

## 2022-10-01 DIAGNOSIS — C61 Malignant neoplasm of prostate: Secondary | ICD-10-CM

## 2022-10-01 NOTE — Progress Notes (Signed)
IR Port ordered per Dr. Ellin Saba verbal order.

## 2022-10-05 ENCOUNTER — Other Ambulatory Visit: Payer: Self-pay

## 2022-10-05 DIAGNOSIS — C61 Malignant neoplasm of prostate: Secondary | ICD-10-CM

## 2022-10-05 MED ORDER — PROCHLORPERAZINE 25 MG RE SUPP: 25.0000 mg | Freq: Three times a day (TID) | RECTAL | 3 refills | Status: DC | PRN

## 2022-10-08 ENCOUNTER — Other Ambulatory Visit: Payer: Self-pay

## 2022-10-08 DIAGNOSIS — C61 Malignant neoplasm of prostate: Secondary | ICD-10-CM

## 2022-10-08 NOTE — Progress Notes (Signed)
Lab orders placed per Dr Kirtland Bouchard

## 2022-10-09 ENCOUNTER — Inpatient Hospital Stay: Payer: BC Managed Care – PPO

## 2022-10-09 VITALS — BP 115/60 | HR 52 | Temp 96.7°F | Resp 18

## 2022-10-09 DIAGNOSIS — C61 Malignant neoplasm of prostate: Secondary | ICD-10-CM

## 2022-10-09 DIAGNOSIS — C7951 Secondary malignant neoplasm of bone: Secondary | ICD-10-CM | POA: Diagnosis not present

## 2022-10-09 LAB — CBC WITH DIFFERENTIAL/PLATELET
Abs Immature Granulocytes: 0.03 10*3/uL (ref 0.00–0.07)
Basophils Absolute: 0.1 10*3/uL (ref 0.0–0.1)
Basophils Relative: 1 %
Eosinophils Absolute: 0.1 10*3/uL (ref 0.0–0.5)
Eosinophils Relative: 1 %
HCT: 37.6 % — ABNORMAL LOW (ref 39.0–52.0)
Hemoglobin: 13 g/dL (ref 13.0–17.0)
Immature Granulocytes: 1 %
Lymphocytes Relative: 11 %
Lymphs Abs: 0.6 10*3/uL — ABNORMAL LOW (ref 0.7–4.0)
MCH: 30.6 pg (ref 26.0–34.0)
MCHC: 34.6 g/dL (ref 30.0–36.0)
MCV: 88.5 fL (ref 80.0–100.0)
Monocytes Absolute: 0.6 10*3/uL (ref 0.1–1.0)
Monocytes Relative: 11 %
Neutro Abs: 4.2 10*3/uL (ref 1.7–7.7)
Neutrophils Relative %: 75 %
Platelets: 272 10*3/uL (ref 150–400)
RBC: 4.25 MIL/uL (ref 4.22–5.81)
RDW: 12.2 % (ref 11.5–15.5)
WBC: 5.6 10*3/uL (ref 4.0–10.5)
nRBC: 0 % (ref 0.0–0.2)

## 2022-10-09 LAB — COMPREHENSIVE METABOLIC PANEL
ALT: 14 U/L (ref 0–44)
AST: 23 U/L (ref 15–41)
Albumin: 4 g/dL (ref 3.5–5.0)
Alkaline Phosphatase: 676 U/L — ABNORMAL HIGH (ref 38–126)
Anion gap: 13 (ref 5–15)
BUN: 13 mg/dL (ref 8–23)
CO2: 20 mmol/L — ABNORMAL LOW (ref 22–32)
Calcium: 9.1 mg/dL (ref 8.9–10.3)
Chloride: 94 mmol/L — ABNORMAL LOW (ref 98–111)
Creatinine, Ser: 0.84 mg/dL (ref 0.61–1.24)
GFR, Estimated: >60 mL/min (ref >60)
Glucose, Bld: 118 mg/dL — ABNORMAL HIGH (ref 70–99)
Potassium: 3.7 mmol/L (ref 3.5–5.1)
Sodium: 127 mmol/L — ABNORMAL LOW (ref 135–145)
Total Bilirubin: 1 mg/dL (ref 0.3–1.2)
Total Protein: 7.1 g/dL (ref 6.5–8.1)

## 2022-10-09 LAB — MAGNESIUM: Magnesium: 2.1 mg/dL (ref 1.7–2.4)

## 2022-10-09 LAB — SAMPLE TO BLOOD BANK

## 2022-10-09 MED ORDER — ONDANSETRON HCL 4 MG/2ML IJ SOLN
8.0000 mg | Freq: Once | INTRAMUSCULAR | Status: AC
  Administered 2022-10-09: 8 mg via INTRAVENOUS
  Filled 2022-10-09: qty 4

## 2022-10-09 MED ORDER — POTASSIUM CHLORIDE IN NACL 20-0.9 MEQ/L-% IV SOLN
Freq: Once | INTRAVENOUS | Status: AC
  Filled 2022-10-09: qty 1000

## 2022-10-09 MED ORDER — SODIUM CHLORIDE 0.9 % IV SOLN: INTRAVENOUS | Status: DC

## 2022-10-09 MED ORDER — MAGNESIUM SULFATE 2 GM/50ML IV SOLN
2.0000 g | Freq: Once | INTRAVENOUS | Status: AC
  Administered 2022-10-09: 2 g via INTRAVENOUS
  Filled 2022-10-09: qty 50

## 2022-10-09 NOTE — Progress Notes (Signed)
Patients today for possible fluid per provider's order. Vital signs stable and pt c/o nausea, vomiting, and constipation. Dr.K made aware and stated to give house fluids and Zofran 8 mg IV.  Discharged from clinic ambulatory in stable condition. Alert and oriented x 3. F/U with [facility] as scheduled.

## 2022-10-09 NOTE — Patient Instructions (Signed)
[facility]  Discharge Instructions: Thank you for choosing [facility] to provide your oncology and hematology care.  If you have a lab appointment with the Cancer Center - please note that after April 8th, 2024, all labs will be drawn in the cancer center.  You do not have to check in or register with the main entrance as you have in the past but will complete your check-in in the cancer center.  Wear comfortable clothing and clothing appropriate for easy access to any Portacath or PICC line.   We strive to give you quality time with your provider. You may need to reschedule your appointment if you arrive late (15 or more minutes).  Arriving late affects you and other patients whose appointments are after yours.  Also, if you miss three or more appointments without notifying the office, you may be dismissed from the clinic at the provider's discretion.      For prescription refill requests, have your pharmacy contact our office and allow 72 hours for refills to be completed.    Today you received house fluids and Zofran 8mg  IV     BELOW ARE SYMPTOMS THAT SHOULD BE REPORTED IMMEDIATELY: *FEVER GREATER THAN 100.4 F (38 C) OR HIGHER *CHILLS OR SWEATING *NAUSEA AND VOMITING THAT IS NOT CONTROLLED WITH YOUR NAUSEA MEDICATION *UNUSUAL SHORTNESS OF BREATH *UNUSUAL BRUISING OR BLEEDING *URINARY PROBLEMS (pain or burning when urinating, or frequent urination) *BOWEL PROBLEMS (unusual diarrhea, constipation, pain near the anus) TENDERNESS IN MOUTH AND THROAT WITH OR WITHOUT PRESENCE OF ULCERS (sore throat, sores in mouth, or a toothache) UNUSUAL RASH, SWELLING OR PAIN  UNUSUAL VAGINAL DISCHARGE OR ITCHING   Items with * indicate a potential emergency and should be followed up as soon as possible or go to the Emergency Department if any problems should occur.  Please show the CHEMOTHERAPY ALERT CARD or IMMUNOTHERAPY ALERT CARD at check-in to the Emergency  Department and triage nurse.  Should you have questions after your visit or need to cancel or reschedule your appointment, please contact [facility] [phone]  and follow the prompts.  Office hours are 8:00 a.m. to 4:30 p.m. Monday - Friday. Please note that voicemails left after 4:00 p.m. may not be returned until the following business day.  We are closed weekends and major holidays. You have access to a nurse at all times for urgent questions. Please call the main number to the clinic [phone] and follow the prompts.  For any non-urgent questions, you may also contact your provider using MyChart. We now offer e-Visits for anyone 63 and older to request care online for non-urgent symptoms. For details visit [email or web address].   Also download the MyChart app! Go to the app store, search "MyChart", open the app, select [place], and log in with your MyChart username and password.

## 2022-10-10 ENCOUNTER — Other Ambulatory Visit: Payer: Self-pay | Admitting: Radiology

## 2022-10-10 NOTE — H&P (Signed)
Referring Physician(s): Doreatha Massed  Supervising Physician: Marliss Coots  Patient Status:  [facility] OP  Chief Complaint:  " I am here for a Port-A-Cath"  Subjective: Patient familiar to IR/NIR service from L4 core bone biopsy on 04/09/2022.  He is a 67 year old male with past medical history significant for diverticulosis, hypertension, skin cancer, hyperlipidemia, hypothyroidism, osteoporosis, vitamin D deficiency who presents now with progressive metastatic prostate carcinoma, initially diagnosed 2018.  He is scheduled today for Port-A-Cath placement to assist with treatment. He denies fever,HA,CP,worsening dyspnea, cough, N/V or bleeding; he does have occ abd/back/bone pain, weight loss, decreased appetite.   Past Medical History:  Diagnosis Date   Bladder obstruction    Complication of anesthesia    Diverticulitis 2015   Dyspnea    Essential hypertension, benign 01/26/2019   at doctor's office elevated, normal at home, no medications at this time   History of basal cell cancer    HLD (hyperlipidemia) 01/26/2019   Hypothyroidism    history of, not currently taking medication   PONV (postoperative nausea and vomiting)    Prostate cancer (HCC)    metastatic to bones   Vitamin D deficiency disease 01/26/2019   Past Surgical History:  Procedure Laterality Date   CYSTOSCOPY W/ URETERAL STENT PLACEMENT Left 11/10/2019   Procedure: CYSTOSCOPY;  Surgeon: Bjorn Pippin, MD;  Location: [facility]S;  Service: Urology;  Laterality: Left;   ESOPHAGOGASTRODUODENOSCOPY (EGD) WITH PROPOFOL N/A 07/07/2020   Procedure: ESOPHAGOGASTRODUODENOSCOPY (EGD) WITH PROPOFOL;  Surgeon: Corbin Ade, MD;  Location: [facility] ENDO SUITE;  Service: Endoscopy;  Laterality: N/A;  pm appt   HERNIA REPAIR  1995   UMBILICAL    IR FLUORO GUIDED NEEDLE PLC ASPIRATION/INJECTION LOC  04/09/2022   MALONEY DILATION N/A 07/07/2020   Procedure: MALONEY DILATION;  Surgeon: Corbin Ade, MD;  Location: [facility];   Service: Endoscopy;  Laterality: N/A;   PROSTATE SURGERY     SKIN CANCER EXCISION     TONSILLECTOMY Bilateral    TOOTH EXTRACTION     front left tooth pulled but no surgery or stitches   TRANSURETHRAL RESECTION OF PROSTATE N/A 01/25/2017   Procedure: TRANSURETHRAL RESECTION OF THE PROSTATE (TURP);  Surgeon: Sebastian Ache, MD;  Location: [facility];  Service: Urology;  Laterality: N/A;   TRANSURETHRAL RESECTION OF PROSTATE N/A 11/10/2019   Procedure: TRANSURETHRAL RESECTION OF THE PROSTATE (TURP);  Surgeon: Bjorn Pippin, MD;  Location: [facility];  Service: Urology;  Laterality: N/A;   WISDOM TOOTH EXTRACTION        Allergies: Crestor [rosuvastatin], Ivp dye [iodinated contrast media], and Wellbutrin [bupropion]  Medications: Prior to Admission medications   Medication Sig Start Date End Date Taking? Authorizing Provider  arginine 500 MG tablet Take 500 mg by mouth 2 (two) times daily.    [provider]  Cholecalciferol 125 MCG (5000 UT) capsule Take 5,000-10,000 Units by mouth See admin instructions. Take 57846 units in the morning and 5000 units at night    [provider]  DANDELION ROOT PO Take 500 mg by mouth daily.    [provider]  dronabinol (MARINOL) 5 MG capsule Take 1 capsule (5 mg total) by mouth 2 (two) times daily before a meal. 09/26/22   Doreatha Massed, MD  dutasteride (AVODART) 0.5 MG capsule Take 1 capsule (0.5 mg total) by mouth daily. 12/07/21   Bjorn Pippin, MD  estradiol (VIVELLE-DOT) 0.1 MG/24HR patch APPLY 1 PATCH(0.1 MG) TOPICALLY TO THE SKIN 2 TIMES A WEEK 11/13/21  Doreatha Massed, MD  HYDROcodone-acetaminophen (NORCO) 5-325 MG tablet Take 1 tablet by mouth every 8 (eight) hours as needed for moderate pain. 09/21/22   Carnella Guadalajara, PA-C  MAGNESIUM PO Take 1 tablet by mouth at bedtime.    [provider]  MELATONIN PO Take 40 mg by mouth at bedtime.    [provider]  MILK THISTLE PO Take 1 capsule by mouth  daily.    [provider]  NATTOKINASE PO Take 2 capsules by mouth daily.    [provider]  NP THYROID 120 MG tablet Take 0.5 tablets (60 mg total) by mouth every morning. 09/02/22   Etta Grandchild, MD  olmesartan (BENICAR) 20 MG tablet TAKE 1 TABLET(20 MG) BY MOUTH DAILY 09/02/22   Etta Grandchild, MD  ondansetron (ZOFRAN ODT) 8 MG disintegrating tablet Take 1 tablet (8 mg total) by mouth every 8 (eight) hours as needed for nausea or vomiting. 11/29/21   Doreatha Massed, MD  predniSONE (DELTASONE) 10 MG tablet Take 1 tablet (10 mg total) by mouth daily. 05/07/22   Doreatha Massed, MD  predniSONE (DELTASONE) 5 MG tablet Take 1 tablet (5 mg total) by mouth daily with breakfast. 05/07/22   Doreatha Massed, MD  prochlorperazine (COMPAZINE) 25 MG suppository Place 1 suppository (25 mg total) rectally every 8 (eight) hours as needed for nausea or vomiting. 10/05/22   Doreatha Massed, MD  pyridOXINE (VITAMIN B6) 25 MG tablet Take 25 mg by mouth daily.    [provider]  QUERCETIN PO Take 1 tablet by mouth 2 (two) times daily.    [provider]  thiamine (VITAMIN B-1) 50 MG tablet Take 1 tablet (50 mg total) by mouth daily. 08/27/22   Etta Grandchild, MD  traMADol (ULTRAM) 50 MG tablet Take 2 tablets (100 mg total) by mouth every 6 (six) hours as needed. 09/13/22   Doreatha Massed, MD  VITAMIN K PO Take 1 capsule by mouth every other day.    [provider]  Zinc 30 MG CAPS Take 30 mg by mouth daily.    [provider]     Vital Signs: Vitals:   10/11/22 1229  BP: (!) 128/51  Pulse: (!) 54  Resp: 18  Temp: 99 F (37.2 C)  SpO2: 99%       Code Status: FULL CODE  Physical Exam: awake/alert; chest- CTA bilat; heart- RRR; abd- soft,+BS,NT; no LE edema  Imaging: No results found.  Labs:  CBC: Recent Labs    08/22/22 0913 09/19/22 1321 09/28/22 0925 10/09/22 1113  WBC 4.2 5.4 5.9 5.6  HGB 11.2* 11.6* 12.8*  13.0  HCT 32.8* 34.4* 36.7* 37.6*  PLT 200 230 218 272    COAGS: Recent Labs    04/09/22 1147  INR 1.0    BMP: Recent Labs    08/22/22 0913 09/19/22 1321 09/28/22 0925 10/09/22 1113  NA 134* 129* 129* 127*  K 3.9 4.0 3.3* 3.7  CL 101 98 96* 94*  CO2 25 22 21* 20*  GLUCOSE 102* 118* 105* 118*  BUN 17 15 12 13   CALCIUM 9.1 9.0 9.0 9.1  CREATININE 0.91 0.85 0.85 0.84  GFRNONAA >60 >60 >60 >60    LIVER FUNCTION TESTS: Recent Labs    08/22/22 0913 09/19/22 1321 09/28/22 0925 10/09/22 1113  BILITOT 0.8 0.8 1.0 1.0  AST 22 25 37 23  ALT 16 13 18 14   ALKPHOS 219* 519* 937* 676*  PROT 6.3* 6.6 6.9 7.1  ALBUMIN 3.7 3.8 3.9 4.0    Assessment and Plan: 67 year old male with past medical history significant for diverticulosis, hypertension, skin cancer, hyperlipidemia, hypothyroidism, osteoporosis, vitamin D deficiency who presents now with progressive metastatic prostate carcinoma, initially diagnosed 2018.  He is scheduled today for Port-A-Cath placement to assist with treatment. Risks and benefits of image guided port-a-catheter placement was discussed with the patient including, but not limited to bleeding, infection, pneumothorax, or fibrin sheath development and need for additional procedures.  All of the patient's questions were answered, patient is agreeable to proceed. Consent signed and in chart.    Electronically Signed: D. Jeananne Rama, PA-C 10/10/2022, 11:35 AM   I spent a total of 25 minutes at the the patient's bedside AND on the patient's hospital floor or unit, greater than 50% of which was counseling/coordinating care for Port-A-Cath placement

## 2022-10-11 ENCOUNTER — Ambulatory Visit (HOSPITAL_COMMUNITY)
Admission: RE | Admit: 2022-10-11 | Discharge: 2022-10-11 | Disposition: A | Discharge status: Home / Self Care | Payer: BC Managed Care – PPO | Source: Ambulatory Visit | Attending: Hematology | Admitting: Hematology

## 2022-10-11 ENCOUNTER — Other Ambulatory Visit: Payer: Self-pay | Admitting: *Deleted

## 2022-10-11 ENCOUNTER — Encounter (HOSPITAL_COMMUNITY): Payer: Self-pay

## 2022-10-11 ENCOUNTER — Other Ambulatory Visit: Payer: Self-pay

## 2022-10-11 DIAGNOSIS — E785 Hyperlipidemia, unspecified: Secondary | ICD-10-CM | POA: Diagnosis not present

## 2022-10-11 DIAGNOSIS — E559 Vitamin D deficiency, unspecified: Secondary | ICD-10-CM | POA: Insufficient documentation

## 2022-10-11 DIAGNOSIS — M810 Age-related osteoporosis without current pathological fracture: Secondary | ICD-10-CM | POA: Diagnosis not present

## 2022-10-11 DIAGNOSIS — C61 Malignant neoplasm of prostate: Secondary | ICD-10-CM | POA: Insufficient documentation

## 2022-10-11 DIAGNOSIS — I10 Essential (primary) hypertension: Secondary | ICD-10-CM | POA: Insufficient documentation

## 2022-10-11 DIAGNOSIS — C7951 Secondary malignant neoplasm of bone: Secondary | ICD-10-CM | POA: Diagnosis not present

## 2022-10-11 DIAGNOSIS — E039 Hypothyroidism, unspecified: Secondary | ICD-10-CM | POA: Insufficient documentation

## 2022-10-11 HISTORY — PX: IR IMAGING GUIDED PORT INSERTION: IMG5740

## 2022-10-11 MED ORDER — HEPARIN SOD (PORK) LOCK FLUSH 100 UNIT/ML IV SOLN
500.0000 [IU] | Freq: Once | INTRAVENOUS | Status: AC
  Administered 2022-10-11: 500 [IU] via INTRAVENOUS

## 2022-10-11 MED ORDER — HEPARIN SOD (PORK) LOCK FLUSH 100 UNIT/ML IV SOLN
INTRAVENOUS | Status: AC
  Filled 2022-10-11: qty 5

## 2022-10-11 MED ORDER — LIDOCAINE-EPINEPHRINE 1 %-1:100000 IJ SOLN
20.0000 mL | Freq: Once | INTRAMUSCULAR | Status: AC
  Administered 2022-10-11: 20 mL via INTRADERMAL

## 2022-10-11 MED ORDER — MIDAZOLAM HCL 2 MG/2ML IJ SOLN
INTRAMUSCULAR | Status: AC
  Filled 2022-10-11: qty 2

## 2022-10-11 MED ORDER — FENTANYL CITRATE (PF) 100 MCG/2ML IJ SOLN
INTRAMUSCULAR | Status: AC | PRN
  Administered 2022-10-11: 50 ug via INTRAVENOUS

## 2022-10-11 MED ORDER — TRAMADOL HCL 50 MG PO TABS: 100.0000 mg | ORAL_TABLET | Freq: Four times a day (QID) | ORAL | 0 refills | Status: DC | PRN

## 2022-10-11 MED ORDER — SODIUM CHLORIDE 0.9 % IV SOLN: INTRAVENOUS | Status: DC

## 2022-10-11 MED ORDER — MIDAZOLAM HCL 2 MG/2ML IJ SOLN
INTRAMUSCULAR | Status: AC | PRN
  Administered 2022-10-11: 1 mg via INTRAVENOUS

## 2022-10-11 MED ORDER — FENTANYL CITRATE (PF) 100 MCG/2ML IJ SOLN
INTRAMUSCULAR | Status: AC
  Filled 2022-10-11: qty 2

## 2022-10-11 MED ORDER — LIDOCAINE-EPINEPHRINE 1 %-1:100000 IJ SOLN
INTRAMUSCULAR | Status: AC
  Filled 2022-10-11: qty 1

## 2022-10-11 NOTE — Discharge Instructions (Signed)
Please call Interventional Radiology clinic [phone] with any questions or concerns.  You may remove your dressing and shower tomorrow.  After the procedure, it is common to have: Discomfort at the port insertion site. Bruising on the skin over the port. This should improve over 3-4 days  Follow these instructions at home:  Medication: Do not use Aspirin or ibuprofen products, such as Advil or Motrin, as it may increase bleeding.  You may resume your usual medications as ordered by your doctor. If your doctor prescribed antibiotics, take them as directed. Do not stop taking them just because you feel better. You need to take the full course of antibiotics.  Eating and drinking: Drink plenty of liquids to keep your urine pale yellow You can resume your regular diet as directed by your doctor   Care of the procedure site Follow instructions from your health care provider about how to take care of your port insertion site. Make sure you: After your port is placed, you will get a manufacturer's information card. The card has information about your port. Keep this card with you at all times Make sure to remember what type of port you have Take care of the port as told by your health care provider DO NOT use EMLA cream for 2 weeks after port placement -the cream will remove surgical glue on your incision Wash your hands with soap and water before and after you change your bandage (dressing). If soap and water are not available, use hand sanitizer Change your dressing as told by your health care provider Leave skin glue, or adhesive strips in place. These skin closures may need to stay in place for 2 weeks or longer Check your port insertion site every day for signs of infection. Check for: Redness, swelling, or pain Fluid or blood Warmth Pus or a bad smell  Activity Return to your normal activities as told by your health care provider. Ask your health care provider what activities  are safe for you Do not lift anything that is heavier than 10 lb (4.5 kg), or the limit that you are told, until your health care provider says that it is safe Do not take baths, swim, or use a hot tub until your health care provider approves. Take showers only. Keep all follow-up visits as told by your doctor  Contact a health care provider if: You cannot flush your port with saline as directed, or you cannot draw blood from the port You have a fever or chills You have redness, swelling, or pain around your port insertion site You have fluid or blood coming from your port insertion site Your port insertion site feels warm to the touch You have pus or a bad smell coming from the port insertion site  Get help right away if: You have chest pain or shortness of breath You have bleeding from your port that you cannot control  Moderate Conscious Sedation-Care After  This sheet gives you information about how to care for yourself after your procedure. Your health care provider may also give you more specific instructions. If you have problems or questions, contact your health care provider.  After the procedure, it is common to have: Sleepiness for several hours. Impaired judgment for several hours. Difficulty with balance. Vomiting if you eat too soon.  Follow these instructions at home:  Rest. Do not participate in activities where you could fall or become injured. Do not drive or use machinery. Do not drink alcohol. Do not take  sleeping pills or medicines that cause drowsiness. Do not make important decisions or sign legal documents. Do not take care of children on your own.  Eating and drinking Follow the diet recommended by your health care provider. Drink enough fluid to keep your urine pale yellow. If you vomit: Drink water, juice, or soup when you can drink without vomiting. Make sure you have little or no nausea before eating solid foods.  General instructions Take  over-the-counter and prescription medicines only as told by your health care provider. Have a responsible adult stay with you for the time you are told. It is important to have someone help care for you until you are awake and alert. Do not smoke. Keep all follow-up visits as told by your health care provider. This is important.  Contact a health care provider if: You are still sleepy or having trouble with balance after 24 hours. You feel light-headed. You keep feeling nauseous or you keep vomiting. You develop a rash. You have a fever. You have redness or swelling around the IV site.  Get help right away if: You have trouble breathing. You have new-onset confusion at home.  This information is not intended to replace advice given to you by your health care provider. Make sure you discuss any questions you have with your healthcare provider.

## 2022-10-11 NOTE — Procedures (Signed)
Interventional Radiology Procedure Note ° °Procedure: Single Lumen Power Port Placement   ° °Access:  Right internal jugular vein ° °Findings: Catheter tip positioned at cavoatrial junction. Port is ready for immediate use.  ° °Complications: None ° °EBL: < 10 mL ° °Recommendations:  °- Ok to shower in 24 hours °- Do not submerge for 7 days °- Routine line care  ° ° °Dylan Suttle, MD ° ° ° °

## 2022-10-17 ENCOUNTER — Other Ambulatory Visit: Payer: Self-pay | Admitting: Urology

## 2022-10-19 ENCOUNTER — Other Ambulatory Visit: Payer: Self-pay | Admitting: Hematology

## 2022-10-22 ENCOUNTER — Telehealth: Payer: Self-pay | Admitting: *Deleted

## 2022-10-22 ENCOUNTER — Other Ambulatory Visit: Payer: Self-pay

## 2022-10-22 ENCOUNTER — Emergency Department (HOSPITAL_COMMUNITY)
Admission: EM | Admit: 2022-10-22 | Discharge: 2022-10-22 | Disposition: A | Discharge status: Home / Self Care | Payer: BC Managed Care – PPO | Source: Home / Self Care | Attending: Emergency Medicine | Admitting: Emergency Medicine

## 2022-10-22 ENCOUNTER — Emergency Department (HOSPITAL_COMMUNITY): Payer: BC Managed Care – PPO

## 2022-10-22 ENCOUNTER — Encounter (HOSPITAL_COMMUNITY): Payer: Self-pay

## 2022-10-22 ENCOUNTER — Encounter: Payer: Self-pay | Admitting: *Deleted

## 2022-10-22 ENCOUNTER — Other Ambulatory Visit: Payer: Self-pay | Admitting: *Deleted

## 2022-10-22 DIAGNOSIS — E860 Dehydration: Secondary | ICD-10-CM | POA: Diagnosis not present

## 2022-10-22 DIAGNOSIS — Z8546 Personal history of malignant neoplasm of prostate: Secondary | ICD-10-CM | POA: Insufficient documentation

## 2022-10-22 DIAGNOSIS — I480 Paroxysmal atrial fibrillation: Secondary | ICD-10-CM | POA: Insufficient documentation

## 2022-10-22 DIAGNOSIS — R42 Dizziness and giddiness: Secondary | ICD-10-CM

## 2022-10-22 DIAGNOSIS — C61 Malignant neoplasm of prostate: Secondary | ICD-10-CM

## 2022-10-22 DIAGNOSIS — C7951 Secondary malignant neoplasm of bone: Secondary | ICD-10-CM

## 2022-10-22 DIAGNOSIS — I4891 Unspecified atrial fibrillation: Secondary | ICD-10-CM

## 2022-10-22 LAB — COMPREHENSIVE METABOLIC PANEL
ALT: 13 U/L (ref 0–44)
AST: 25 U/L (ref 15–41)
Albumin: 3.8 g/dL (ref 3.5–5.0)
Alkaline Phosphatase: 1283 U/L — ABNORMAL HIGH (ref 38–126)
Anion gap: 10 (ref 5–15)
BUN: 11 mg/dL (ref 8–23)
CO2: 21 mmol/L — ABNORMAL LOW (ref 22–32)
Calcium: 8.5 mg/dL — ABNORMAL LOW (ref 8.9–10.3)
Chloride: 93 mmol/L — ABNORMAL LOW (ref 98–111)
Creatinine, Ser: 0.73 mg/dL (ref 0.61–1.24)
GFR, Estimated: >60 mL/min (ref >60)
Glucose, Bld: 121 mg/dL — ABNORMAL HIGH (ref 70–99)
Potassium: 3 mmol/L — ABNORMAL LOW (ref 3.5–5.1)
Sodium: 124 mmol/L — ABNORMAL LOW (ref 135–145)
Total Bilirubin: 1.6 mg/dL — ABNORMAL HIGH (ref 0.3–1.2)
Total Protein: 6.8 g/dL (ref 6.5–8.1)

## 2022-10-22 LAB — URINALYSIS, ROUTINE W REFLEX MICROSCOPIC
Bilirubin Urine: NEGATIVE
Glucose, UA: NEGATIVE mg/dL
Hgb urine dipstick: NEGATIVE
Ketones, ur: 5 mg/dL — AB
Leukocytes,Ua: NEGATIVE
Nitrite: NEGATIVE
Protein, ur: NEGATIVE mg/dL
Specific Gravity, Urine: 1.006 (ref 1.005–1.030)
pH: 6 (ref 5.0–8.0)

## 2022-10-22 LAB — CBC
HCT: 35.6 % — ABNORMAL LOW (ref 39.0–52.0)
Hemoglobin: 13 g/dL (ref 13.0–17.0)
MCH: 30.9 pg (ref 26.0–34.0)
MCHC: 36.5 g/dL — ABNORMAL HIGH (ref 30.0–36.0)
MCV: 84.6 fL (ref 80.0–100.0)
Platelets: 235 10*3/uL (ref 150–400)
RBC: 4.21 MIL/uL — ABNORMAL LOW (ref 4.22–5.81)
RDW: 12.4 % (ref 11.5–15.5)
WBC: 6.6 10*3/uL (ref 4.0–10.5)
nRBC: 0 % (ref 0.0–0.2)

## 2022-10-22 LAB — CBG MONITORING, ED: Glucose-Capillary: 124 mg/dL — ABNORMAL HIGH (ref 70–99)

## 2022-10-22 LAB — MAGNESIUM: Magnesium: 1.9 mg/dL (ref 1.7–2.4)

## 2022-10-22 LAB — LIPASE, BLOOD: Lipase: 27 U/L (ref 11–51)

## 2022-10-22 MED ORDER — POTASSIUM CHLORIDE CRYS ER 20 MEQ PO TBCR
40.0000 meq | EXTENDED_RELEASE_TABLET | Freq: Once | ORAL | Status: AC
  Administered 2022-10-22: 40 meq via ORAL
  Filled 2022-10-22: qty 2

## 2022-10-22 MED ORDER — ONDANSETRON HCL 4 MG/2ML IJ SOLN
4.0000 mg | Freq: Once | INTRAMUSCULAR | Status: AC
  Administered 2022-10-22: 4 mg via INTRAVENOUS
  Filled 2022-10-22: qty 2

## 2022-10-22 MED ORDER — METOPROLOL TARTRATE 25 MG PO TABS: 12.5000 mg | ORAL_TABLET | Freq: Two times a day (BID) | ORAL | 1 refills | Status: DC

## 2022-10-22 MED ORDER — SODIUM CHLORIDE 0.9 % IV BOLUS
1000.0000 mL | Freq: Once | INTRAVENOUS | Status: AC
  Administered 2022-10-22: 1000 mL via INTRAVENOUS

## 2022-10-22 MED ORDER — LACTATED RINGERS IV BOLUS
1000.0000 mL | Freq: Once | INTRAVENOUS | Status: AC
  Administered 2022-10-22: 1000 mL via INTRAVENOUS

## 2022-10-22 MED ORDER — HYDROCODONE-ACETAMINOPHEN 5-325 MG PO TABS
1.0000 | ORAL_TABLET | Freq: Two times a day (BID) | ORAL | 0 refills | Status: DC | PRN
Start: 2022-10-22 — End: 2022-11-14

## 2022-10-22 MED ORDER — METOPROLOL TARTRATE 25 MG PO TABS
12.5000 mg | ORAL_TABLET | ORAL | Status: AC
  Administered 2022-10-22: 12.5 mg via ORAL
  Filled 2022-10-22: qty 1

## 2022-10-22 MED ORDER — POTASSIUM CHLORIDE 10 MEQ/100ML IV SOLN
10.0000 meq | Freq: Once | INTRAVENOUS | Status: AC
  Administered 2022-10-22: 10 meq via INTRAVENOUS
  Filled 2022-10-22: qty 100

## 2022-10-22 MED ORDER — HEPARIN SOD (PORK) LOCK FLUSH 100 UNIT/ML IV SOLN
500.0000 [IU] | Freq: Once | INTRAVENOUS | Status: AC
  Administered 2022-10-22: 500 [IU] via INTRAVENOUS
  Filled 2022-10-22: qty 5

## 2022-10-22 MED ORDER — MEGESTROL ACETATE 400 MG/10ML PO SUSP: 400.0000 mg | Freq: Two times a day (BID) | ORAL | 0 refills | Status: DC

## 2022-10-22 NOTE — Discharge Instructions (Signed)
You were seen for your dehydration and atrial fibrillation in the emergency department.   At home, please take the metoprolol we have prescribed you.    Check your MyChart online for the results of any tests that had not resulted by the time you left the emergency department.   Follow-up with your primary doctor in 2-3 days regarding your visit.  Follow-up with cardiology to discuss your atrial fibrillation.  Return immediately to the emergency department if you experience any of the following: Palpitations lasting more than 5 minutes at a time, fainting, or any other concerning symptoms.    Thank you for visiting our Emergency Department. It was a pleasure taking care of you today.

## 2022-10-22 NOTE — ED Notes (Signed)
Port-a-cath flushed with heparin 5 ml prior to deaccessing.

## 2022-10-22 NOTE — Progress Notes (Signed)
Patient is experiencing weight loss and decrease in appetite due to disease process and treatment regimen.  Per Dr. Ellin Saba, Megace 400 mg bid sent to pharmacy.

## 2022-10-22 NOTE — ED Provider Notes (Signed)
Scott [name]   CSN: [id number] Arrival date & time: 10/22/22  1150     History  Chief Complaint  Patient presents with   Dizziness    Scott [name] is a 67 y.o. male.  67 year old male with a history of metastatic prostate cancer who presents to the emergency department with dizziness and concern for dehydration.  History obtained per the patient's wife who states that since starting chemotherapy has had very frequent nausea and vomiting.  Has had significant weight loss recently last night started experiencing a lightheaded sensation and went to the cancer center today for a few lipid infusion but they were unable to see him so he came to the emergency department.  No history of atrial fibrillation.  Not on blood thinners.  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  No history of frequent falls or bleeding.  Does have a port placed in his right chest that was placed approximately 10 days ago.       Home Medications Prior to Admission medications   Medication Sig Start Date End Date Taking? Authorizing Provider  metoprolol tartrate (LOPRESSOR) 25 MG tablet Take 0.5 tablets (12.5 mg total) by mouth 2 (two) times daily. 10/22/22  Yes Rondel Baton, MD  arginine 500 MG tablet Take 500 mg by mouth 2 (two) times daily.    [provider]  Cholecalciferol 125 MCG (5000 UT) capsule Take 5,000-10,000 Units by mouth See admin instructions. Take 69629 units in the morning and 5000 units at night    [provider]  DANDELION ROOT PO Take 500 mg by mouth daily.    [provider]  dronabinol (MARINOL) 5 MG capsule Take 1 capsule (5 mg total) by mouth 2 (two) times daily before a meal. 09/26/22   Doreatha Massed, MD  dutasteride (AVODART) 0.5 MG capsule TAKE 1 CAPSULE(0.5 MG) BY MOUTH DAILY 10/17/22   Bjorn Pippin, MD  estradiol (VIVELLE-DOT) 0.1 MG/24HR patch APPLY 1 PATCH(0.1 MG) TOPICALLY  TO THE SKIN 2 TIMES A WEEK 10/19/22   Doreatha Massed, MD  HYDROcodone-acetaminophen (NORCO) 5-325 MG tablet Take 1 tablet by mouth every 8 (eight) hours as needed for moderate pain. 09/21/22   Carnella Guadalajara, PA-C  MAGNESIUM PO Take 1 tablet by mouth at bedtime.    [provider]  megestrol (MEGACE) 400 MG/10ML suspension Take 10 mLs (400 mg total) by mouth 2 (two) times daily. 10/22/22   Doreatha Massed, MD  MELATONIN PO Take 40 mg by mouth at bedtime.    [provider]  MILK THISTLE PO Take 1 capsule by mouth daily.    [provider]  NATTOKINASE PO Take 2 capsules by mouth daily.    [provider]  NP THYROID 120 MG tablet Take 0.5 tablets (60 mg total) by mouth every morning. 09/02/22   Etta Grandchild, MD  olmesartan (BENICAR) 20 MG tablet TAKE 1 TABLET(20 MG) BY MOUTH DAILY 09/02/22   Etta Grandchild, MD  ondansetron (ZOFRAN ODT) 8 MG disintegrating tablet Take 1 tablet (8 mg total) by mouth every 8 (eight) hours as needed for nausea or vomiting. 11/29/21   Doreatha Massed, MD  predniSONE (DELTASONE) 10 MG tablet Take 1 tablet (10 mg total) by mouth daily. 05/07/22   Doreatha Massed, MD  predniSONE (DELTASONE) 5 MG tablet Take 1 tablet (5 mg total) by mouth daily with breakfast. 05/07/22   Doreatha Massed, MD  prochlorperazine (  COMPAZINE) 25 MG suppository Place 1 suppository (25 mg total) rectally every 8 (eight) hours as needed for nausea or vomiting. 10/05/22   Doreatha Massed, MD  pyridOXINE (VITAMIN B6) 25 MG tablet Take 25 mg by mouth daily.    [provider]  QUERCETIN PO Take 1 tablet by mouth 2 (two) times daily.    [provider]  thiamine (VITAMIN B-1) 50 MG tablet Take 1 tablet (50 mg total) by mouth daily. 08/27/22   Etta Grandchild, MD  traMADol (ULTRAM) 50 MG tablet Take 2 tablets (100 mg total) by mouth every 6 (six) hours as needed. 10/11/22   Doreatha Massed, MD  VITAMIN K PO Take 1  capsule by mouth every other day.    [provider]  Zinc 30 MG CAPS Take 30 mg by mouth daily.    [provider]      Allergies    Crestor [rosuvastatin], Ivp dye [iodinated contrast media], and Wellbutrin [bupropion]    Review of Systems   Review of Systems  Physical Exam Updated Vital Signs BP 128/88   Pulse 74   Temp 98.6 F (37 C) (Oral)   Resp 19   Ht 5\' 6"  (1.676 m)   Wt 63 kg   SpO2 100%   BMI 22.44 kg/m  Physical Exam Vitals and nursing [name] reviewed.  Constitutional:      General: He is not in acute distress.    Appearance: He is well-developed.  HENT:     Head: Normocephalic and atraumatic.     Right Ear: External ear normal.     Left Ear: External ear normal.     Nose: Nose normal.  Eyes:     Extraocular Movements: Extraocular movements intact.     Conjunctiva/sclera: Conjunctivae normal.     Pupils: Pupils are equal, round, and reactive to light.  Cardiovascular:     Rate and Rhythm: Tachycardia present. Rhythm irregular.     Heart sounds: Normal heart sounds.     Comments: Port in right chest wall surgical site appears clean dry and intact Pulmonary:     Effort: Pulmonary effort is normal. No respiratory distress.     Breath sounds: Normal breath sounds.  Abdominal:     General: There is no distension.     Palpations: Abdomen is soft. There is no mass.     Tenderness: There is no abdominal tenderness. There is no guarding.  Musculoskeletal:     Cervical back: Normal range of motion and neck supple.     Right lower leg: No edema.     Left lower leg: No edema.  Skin:    General: Skin is warm and dry.  Neurological:     Mental Status: He is alert. Mental status is at baseline.     Cranial Nerves: No cranial nerve deficit.     Comments: No truncal ataxia.  Normal finger-nose.  Psychiatric:        Mood and Affect: Mood normal.        Behavior: Behavior normal.     ED Results / Procedures / Treatments   Labs (all labs  ordered are listed, but only abnormal results are displayed) Labs Reviewed  COMPREHENSIVE METABOLIC PANEL - Abnormal; Notable for the following components:      Result Value   Sodium 124 (*)    Potassium 3.0 (*)    Chloride 93 (*)    CO2 21 (*)    Glucose, Bld 121 (*)  Calcium 8.5 (*)    Alkaline Phosphatase 1,283 (*)    Total Bilirubin 1.6 (*)    All other components within normal limits  CBC - Abnormal; Notable for the following components:   RBC 4.21 (*)    HCT 35.6 (*)    MCHC 36.5 (*)    All other components within normal limits  URINALYSIS, ROUTINE W REFLEX MICROSCOPIC - Abnormal; Notable for the following components:   Ketones, ur 5 (*)    All other components within normal limits  CBG MONITORING, ED - Abnormal; Notable for the following components:   Glucose-Capillary 124 (*)    All other components within normal limits  LIPASE, BLOOD  MAGNESIUM    EKG EKG Interpretation Date/Time:  Monday October 22 2022 12:22:57 EDT Ventricular Rate:  139 PR Interval:    QRS Duration:  74 QT Interval:  298 QTC Calculation: 453 R Axis:   53  Text Interpretation: Atrial fibrillation with rapid ventricular response ST depression, consider subendocardial injury Abnormal QRS-T angle, consider primary T wave abnormality Abnormal ECG When compared with ECG of 06-Jul-2020 08:28,  Atrial fibrillaiton with RVR and ST depressions now present Confirmed by Vonita Moss [phone]) on 10/22/2022 12:43:57 PM  Radiology DG Chest Portable 1 View  Result Date: 10/22/2022 CLINICAL DATA:  dizziness EXAM: PORTABLE CHEST 1 VIEW COMPARISON:  None Available. FINDINGS: Bilateral lung fields are clear. Bilateral costophrenic angles are clear. Normal cardio-mediastinal silhouette. Aortic arch calcifications noted. No acute osseous abnormalities. The soft tissues are within normal limits. Tube/lines: CT Port-A-Cath is seen with its tip overlying the lower portion of superior vena cava. IMPRESSION: 1. No active  disease. Aortic Atherosclerosis (ICD10-I70.0). Electronically Signed   By: Jules Schick M.D.   On: 10/22/2022 13:34    Procedures Procedures    Medications Ordered in ED Medications  lactated ringers bolus 1,000 mL (1,000 mLs Intravenous Bolus 10/22/22 1245)  ondansetron (ZOFRAN) injection 4 mg (4 mg Intravenous Given 10/22/22 1308)  sodium chloride 0.9 % bolus 1,000 mL (0 mLs Intravenous Stopped 10/22/22 1509)  potassium chloride 10 mEq in 100 mL IVPB (0 mEq Intravenous Stopped 10/22/22 1509)  potassium chloride SA (KLOR-CON M) CR tablet 40 mEq (40 mEq Oral Given 10/22/22 1359)  metoprolol tartrate (LOPRESSOR) tablet 12.5 mg (12.5 mg Oral Given 10/22/22 1529)    ED Course/ Medical Decision Making/ A&P Clinical Course as of 10/22/22 1624  Mon Oct 22, 2022  1307 Dr Roderic Palau may benefit from [facility] if not frequent falls or brain mets.  [RP]  1316 Sodium(!): 124 Baseline of 129 [RP]    Clinical Course User Index [RP] Rondel Baton, MD          CHA2DS2-VASc Score: 2                        Medical Decision Making Amount and/or Complexity of Data Reviewed Labs: ordered. Decision-making details documented in ED Course. Radiology: ordered.  Risk Prescription drug management.   Oman Mosey is a 67 y.o. male with comorbidities that complicate the patient evaluation including metastatic prostate cancer on chemotherapy who presents to the emergency department with nausea and vomiting and dizziness found to be in atrial fibrillation with RVR  Initial Ddx:  A-fib with RVR, dehydration, AKI, electrolyte abnormality  MDM/Course:  Patient presented to the emergency department with nausea and vomiting despite his antiemetics at home.  Was found to be in A-fib with RVR which is new onset.  He is hemodynamically stable.  Heart rate improved with 2 L of fluids.  Patient was also given 12.5 of metoprolol in order to avoid bradycardia.  Electrolytes did show that he is hyponatremic which appears to be  a chronic finding for him though it is somewhat worse than typical.  Suspect it may be slightly exacerbated by his nausea and vomiting.  Potassium was also found to be low so this was replenished orally.  Upon re-evaluation was feeling better after the fluids.  No persistent vomiting in the emergency department.  Did discuss the patient's case with cardiology who thought that he may potentially benefit from anticoagulation since she has a CHA2DS2-VASc of 2 and has bled score of 1.  However upon further discussing with the patient and his wife it does appear that he has skull mets so may be high risk for ICH so we will hold off on his anticoagulation at this time.  Will have the patient continue the metoprolol and follow-up with cardiology and his primary doctor.  This patient presents to the ED for concern of complaints listed in HPI, this involves an extensive number of treatment options, and is a complaint that carries with it a high risk of complications and morbidity. Disposition including potential need for admission considered.   Dispo: DC Home. Return precautions discussed including, but not limited to, those listed in the AVS. Allowed pt time to ask questions which were answered fully prior to dc.  Additional history obtained from spouse Records reviewed Outpatient Clinic Notes The following labs were independently interpreted: Chemistry and show  chronic hyponatremia and acute hypokalemia I independently reviewed the following imaging with scope of interpretation limited to determining acute life threatening conditions related to emergency care: Chest x-ray and agree with the radiologist interpretation with the following exceptions: none I personally reviewed and interpreted cardiac monitoring: atrial fibrillation with RVR I personally reviewed and interpreted the pt's EKG: see above for interpretation  I have reviewed the patients home medications and made adjustments as needed Consults:  Cardiology Social Determinants of health:  Elderly  CRITICAL CARE Performed by: Rondel Baton   Total critical care time: 30 minutes  Critical care time was exclusive of separately billable procedures and treating other patients.  Critical care was necessary to treat or prevent imminent or life-threatening deterioration.  Critical care was time spent personally by me on the following activities: development of treatment plan with patient and/or surrogate as well as nursing, discussions with consultants, evaluation of patient's response to treatment, examination of patient, obtaining history from patient or surrogate, ordering and performing treatments and interventions, ordering and review of laboratory studies, ordering and review of radiographic studies, pulse oximetry and re-evaluation of patient's condition.  Final Clinical Impression(s) / ED Diagnoses Final diagnoses:  Dizziness  Paroxysmal atrial fibrillation (HCC)  Dehydration    Rx / DC Orders ED Discharge Orders          Ordered    metoprolol tartrate (LOPRESSOR) 25 MG tablet  2 times daily        10/22/22 1558    Ambulatory referral to Cardiology        10/22/22 1559              Rondel Baton, MD 10/22/22 [phone]

## 2022-10-22 NOTE — ED Triage Notes (Signed)
Pt brought in by wife for possible dehydration and dizziness. Pt states dizziness starting last night around 1115 pm. Per spouse pt has not eaten or drank all weekend, pt states a moment of confusion, (pt states he had batteries in his hand, could not find the batteries didn't know what happened to batteries, and later realized he dropped them."

## 2022-10-22 NOTE — Telephone Encounter (Signed)
Spoke to patient and his wife this morning and he has had nausea, vomiting and severe weakness over the weekend.  Unfortunately, we could not add him to the schedule for fluids/labs today, however has an appointment first thing in the am.  Per wife, she does not think he will make it until tomorrow morning, as she is afraid he is too dehydrated to wait.  Advised that he go to the ER for evaluation if he is unable to sustain between now and then.  Verbalized understanding.

## 2022-10-23 ENCOUNTER — Other Ambulatory Visit: Payer: Self-pay | Admitting: *Deleted

## 2022-10-23 ENCOUNTER — Inpatient Hospital Stay: Payer: BC Managed Care – PPO

## 2022-10-23 ENCOUNTER — Telehealth: Payer: Self-pay

## 2022-10-23 NOTE — Transitions of Care (Post Inpatient/ED Visit) (Unsigned)
   10/23/2022  Name: Scott [name] MRN: [id number] DOB: 05-Oct-1955  Today's TOC FU Call Status: Today's TOC FU Call Status:: Unsuccessful Call (1st Attempt) Unsuccessful Call (1st Attempt) Date: 10/23/22  Attempted to reach the patient regarding the most recent Inpatient/ED visit.  Follow Up Plan: Additional outreach attempts will be made to reach the patient to complete the Transitions of Care (Post Inpatient/ED visit) call.   Signature Karena Addison, LPN [facility] Nurse Health Advisor Direct Dial [phone]

## 2022-10-25 NOTE — Transitions of Care (Post Inpatient/ED Visit) (Signed)
10/25/2022  Name: Scott [name] MRN: [id number] DOB: 1955/08/08  Today's TOC FU Call Status: Today's TOC FU Call Status:: Successful TOC FU Call Completed Unsuccessful Call (1st Attempt) Date: 10/23/22 [facility] FU Call Complete Date: 10/25/22  Transition Care Management Follow-up Telephone Call Date of Discharge: 10/22/22 [facility] Type of Discharge: Emergency Department Reason for ED Visit: Other: (dizziness) How have you been since you were released from the hospital?: Better Any questions or concerns?: No  Items Reviewed: Did you receive and understand the discharge instructions provided?: Yes Medications obtained,verified, and reconciled?: Yes (Medications Reviewed) Any new allergies since your discharge?: No Dietary orders reviewed?: Yes Do you have support at home?: Yes People in Home: spouse  Medications Reviewed Today: Medications Reviewed Today     Reviewed by Karena Addison, LPN (Licensed Practical Nurse) on 10/25/22 at 1503  Med List Status: <None>   Medication Order Taking? Sig Documenting Provider Last Dose Status Informant  arginine 500 MG tablet [id number] No Take 500 mg by mouth 2 (two) times daily. [provider] not taking Active Self  Cholecalciferol 125 MCG (5000 UT) capsule [id number] No Take 5,000-10,000 Units by mouth See admin instructions. Take 62952 units in the morning and 5000 units at night [provider] 10/10/2022 Active   DANDELION ROOT PO [id number] No Take 500 mg by mouth daily. [provider] not taking Active Self  dronabinol (MARINOL) 5 MG capsule [id number] No Take 1 capsule (5 mg total) by mouth 2 (two) times daily before a meal. Doreatha Massed, MD 10/10/2022 Active   dutasteride (AVODART) 0.5 MG capsule [id number]  TAKE 1 CAPSULE(0.5 MG) BY MOUTH DAILY Bjorn Pippin, MD  Active   estradiol (VIVELLE-DOT) 0.1 MG/24HR patch [id number]  APPLY 1 PATCH(0.1 MG) TOPICALLY TO THE SKIN 2 TIMES A WEEK  Doreatha Massed, MD  Active   HYDROcodone-acetaminophen (NORCO) 5-325 MG tablet [id number]  Take 1 tablet by mouth every 12 (twelve) hours as needed for moderate pain. Doreatha Massed, MD  Active   MAGNESIUM PO [id number] No Take 1 tablet by mouth at bedtime. [provider] not taking Active Self  megestrol (MEGACE) 400 MG/10ML suspension [id number]  Take 10 mLs (400 mg total) by mouth 2 (two) times daily. Doreatha Massed, MD  Active   MELATONIN PO [id number] No Take 40 mg by mouth at bedtime. [provider] Taking Active Self  metoprolol tartrate (LOPRESSOR) 25 MG tablet [id number]  Take 0.5 tablets (12.5 mg total) by mouth 2 (two) times daily. Rondel Baton, MD  Active   MILK THISTLE PO [id number] No Take 1 capsule by mouth daily. [provider] Past Month Active Self  NATTOKINASE PO [id number] No Take 2 capsules by mouth daily. [provider] Past Month Active Self  NP THYROID 120 MG tablet [id number] No Take 0.5 tablets (60 mg total) by mouth every morning. Etta Grandchild, MD Past Month Active   olmesartan [facility]) 20 MG tablet [id number] No TAKE 1 TABLET(20 MG) BY MOUTH DAILY Etta Grandchild, MD 10/10/2022 Active   ondansetron (ZOFRAN ODT) 8 MG disintegrating tablet [id number] No Take 1 tablet (8 mg total) by mouth every 8 (eight) hours as needed for nausea or vomiting. Doreatha Massed, MD 10/11/2022 1200 Active   predniSONE (DELTASONE) 10 MG tablet [id number] No Take 1 tablet (10 mg total) by mouth daily. Doreatha Massed, MD 10/10/2022 Active   predniSONE (DELTASONE) 5 MG tablet [id number] No Take 1 tablet (5 mg total) by mouth daily with breakfast. Doreatha Massed,  MD Past Week Active   prochlorperazine (COMPAZINE) 25 MG suppository [id number] No Place 1 suppository (25 mg total) rectally every 8 (eight) hours as needed for nausea or vomiting. Doreatha Massed, MD 10/11/2022 0930 Active   pyridOXINE (VITAMIN B6) 25 MG tablet  [id number] No Take 25 mg by mouth daily. [provider] not taking Active   QUERCETIN PO [id number] No Take 1 tablet by mouth 2 (two) times daily. [provider] not taking Active Self  thiamine (VITAMIN B-1) 50 MG tablet [id number] No Take 1 tablet (50 mg total) by mouth daily. Etta Grandchild, MD not taking Active   traMADol (ULTRAM) 50 MG tablet [id number] No Take 2 tablets (100 mg total) by mouth every 6 (six) hours as needed. Doreatha Massed, MD 10/11/2022 0900 Active   VITAMIN K PO [id number] No Take 1 capsule by mouth every other day. [provider] not taking Active Self  Zinc 30 MG CAPS [id number] No Take 30 mg by mouth daily. [provider] not taking Active Self  Med List Note Remi Haggard, RPH-CPP 10/04/21 1557): Nubeqa filled at [facility] 2022            Home Care and Equipment/Supplies: Were Home Health Services Ordered?: NA Any new equipment or medical supplies ordered?: NA  Functional Questionnaire: Do you need assistance with bathing/showering or dressing?: No Do you need assistance with meal preparation?: No Do you need assistance with eating?: No Do you have difficulty maintaining continence: No Do you need assistance with getting out of bed/getting out of a chair/moving?: No Do you have difficulty managing or taking your medications?: No  Follow up appointments reviewed: PCP Follow-up appointment confirmed?: No (declined) MD Provider Line Number:[phone] Given: No Specialist Hospital Follow-up appointment confirmed?: NA Do you need transportation to your follow-up appointment?: No Do you understand care options if your condition(s) worsen?: Yes-patient verbalized understanding  Declined appt  SIGNATURE Karena Addison, LPN [facility] Nurse Health Advisor Direct Dial [phone]

## 2022-10-30 ENCOUNTER — Inpatient Hospital Stay: Payer: BC Managed Care – PPO | Attending: Hematology

## 2022-10-30 DIAGNOSIS — C61 Malignant neoplasm of prostate: Secondary | ICD-10-CM | POA: Insufficient documentation

## 2022-10-30 DIAGNOSIS — M545 Low back pain, unspecified: Secondary | ICD-10-CM | POA: Diagnosis not present

## 2022-10-30 DIAGNOSIS — M25551 Pain in right hip: Secondary | ICD-10-CM | POA: Diagnosis not present

## 2022-10-30 DIAGNOSIS — R112 Nausea with vomiting, unspecified: Secondary | ICD-10-CM | POA: Insufficient documentation

## 2022-10-30 DIAGNOSIS — K5900 Constipation, unspecified: Secondary | ICD-10-CM | POA: Diagnosis not present

## 2022-10-30 DIAGNOSIS — Z800 Family history of malignant neoplasm of digestive organs: Secondary | ICD-10-CM | POA: Insufficient documentation

## 2022-10-30 DIAGNOSIS — C779 Secondary and unspecified malignant neoplasm of lymph node, unspecified: Secondary | ICD-10-CM | POA: Diagnosis not present

## 2022-10-30 DIAGNOSIS — D649 Anemia, unspecified: Secondary | ICD-10-CM | POA: Insufficient documentation

## 2022-10-30 DIAGNOSIS — Z8042 Family history of malignant neoplasm of prostate: Secondary | ICD-10-CM | POA: Insufficient documentation

## 2022-10-30 DIAGNOSIS — Z87891 Personal history of nicotine dependence: Secondary | ICD-10-CM | POA: Insufficient documentation

## 2022-10-30 DIAGNOSIS — M25552 Pain in left hip: Secondary | ICD-10-CM | POA: Diagnosis not present

## 2022-10-30 DIAGNOSIS — R232 Flushing: Secondary | ICD-10-CM | POA: Diagnosis not present

## 2022-10-30 DIAGNOSIS — C7951 Secondary malignant neoplasm of bone: Secondary | ICD-10-CM | POA: Insufficient documentation

## 2022-10-30 DIAGNOSIS — Z803 Family history of malignant neoplasm of breast: Secondary | ICD-10-CM | POA: Diagnosis not present

## 2022-10-30 LAB — CBC WITH DIFFERENTIAL/PLATELET
Abs Immature Granulocytes: 0.03 10*3/uL (ref 0.00–0.07)
Basophils Absolute: 0 10*3/uL (ref 0.0–0.1)
Basophils Relative: 1 %
Eosinophils Absolute: 0 10*3/uL (ref 0.0–0.5)
Eosinophils Relative: 0 %
HCT: 30.2 % — ABNORMAL LOW (ref 39.0–52.0)
Hemoglobin: 10.6 g/dL — ABNORMAL LOW (ref 13.0–17.0)
Immature Granulocytes: 1 %
Lymphocytes Relative: 17 %
Lymphs Abs: 0.9 10*3/uL (ref 0.7–4.0)
MCH: 30.5 pg (ref 26.0–34.0)
MCHC: 35.1 g/dL (ref 30.0–36.0)
MCV: 86.8 fL (ref 80.0–100.0)
Monocytes Absolute: 0.5 10*3/uL (ref 0.1–1.0)
Monocytes Relative: 9 %
Neutro Abs: 4 10*3/uL (ref 1.7–7.7)
Neutrophils Relative %: 72 %
Platelets: 221 10*3/uL (ref 150–400)
RBC: 3.48 MIL/uL — ABNORMAL LOW (ref 4.22–5.81)
RDW: 13.2 % (ref 11.5–15.5)
WBC: 5.5 10*3/uL (ref 4.0–10.5)
nRBC: 0 % (ref 0.0–0.2)

## 2022-10-30 LAB — COMPREHENSIVE METABOLIC PANEL
ALT: 17 U/L (ref 0–44)
AST: 27 U/L (ref 15–41)
Albumin: 3.2 g/dL — ABNORMAL LOW (ref 3.5–5.0)
Alkaline Phosphatase: 890 U/L — ABNORMAL HIGH (ref 38–126)
Anion gap: 10 (ref 5–15)
BUN: 9 mg/dL (ref 8–23)
CO2: 19 mmol/L — ABNORMAL LOW (ref 22–32)
Calcium: 8.6 mg/dL — ABNORMAL LOW (ref 8.9–10.3)
Chloride: 97 mmol/L — ABNORMAL LOW (ref 98–111)
Creatinine, Ser: 0.68 mg/dL (ref 0.61–1.24)
GFR, Estimated: >60 mL/min (ref >60)
Glucose, Bld: 104 mg/dL — ABNORMAL HIGH (ref 70–99)
Potassium: 3.5 mmol/L (ref 3.5–5.1)
Sodium: 126 mmol/L — ABNORMAL LOW (ref 135–145)
Total Bilirubin: 0.5 mg/dL (ref 0.3–1.2)
Total Protein: 6 g/dL — ABNORMAL LOW (ref 6.5–8.1)

## 2022-10-30 LAB — MAGNESIUM: Magnesium: 2.3 mg/dL (ref 1.7–2.4)

## 2022-10-30 LAB — PSA: Prostatic Specific Antigen: >1500 ng/mL — ABNORMAL HIGH (ref 0.00–4.00)

## 2022-10-30 MED ORDER — HEPARIN SOD (PORK) LOCK FLUSH 100 UNIT/ML IV SOLN
500.0000 [IU] | Freq: Once | INTRAVENOUS | Status: AC
  Administered 2022-10-30: 500 [IU] via INTRAVENOUS

## 2022-10-30 MED ORDER — SODIUM CHLORIDE 0.9% FLUSH
10.0000 mL | Freq: Once | INTRAVENOUS | Status: AC
  Administered 2022-10-30: 10 mL via INTRAVENOUS

## 2022-10-30 NOTE — Patient Instructions (Signed)
[facility]  Discharge Instructions: Thank you for choosing [facility] to provide your oncology and hematology care.  If you have a lab appointment with the Cancer Center - please note that after April 8th, 2024, all labs will be drawn in the cancer center.  You do not have to check in or register with the main entrance as you have in the past but will complete your check-in in the cancer center.  Wear comfortable clothing and clothing appropriate for easy access to any Portacath or PICC line.   We strive to give you quality time with your provider. You may need to reschedule your appointment if you arrive late (15 or more minutes).  Arriving late affects you and other patients whose appointments are after yours.  Also, if you miss three or more appointments without notifying the office, you may be dismissed from the clinic at the provider's discretion.      For prescription refill requests, have your pharmacy contact our office and allow 72 hours for refills to be completed.    Today you received your port flush with labs   To help prevent nausea and vomiting after your treatment, we encourage you to take your nausea medication as directed.  BELOW ARE SYMPTOMS THAT SHOULD BE REPORTED IMMEDIATELY: *FEVER GREATER THAN 100.4 F (38 C) OR HIGHER *CHILLS OR SWEATING *NAUSEA AND VOMITING THAT IS NOT CONTROLLED WITH YOUR NAUSEA MEDICATION *UNUSUAL SHORTNESS OF BREATH *UNUSUAL BRUISING OR BLEEDING *URINARY PROBLEMS (pain or burning when urinating, or frequent urination) *BOWEL PROBLEMS (unusual diarrhea, constipation, pain near the anus) TENDERNESS IN MOUTH AND THROAT WITH OR WITHOUT PRESENCE OF ULCERS (sore throat, sores in mouth, or a toothache) UNUSUAL RASH, SWELLING OR PAIN  UNUSUAL VAGINAL DISCHARGE OR ITCHING   Items with * indicate a potential emergency and should be followed up as soon as possible or go to the Emergency Department if any problems should  occur.  Please show the CHEMOTHERAPY ALERT CARD or IMMUNOTHERAPY ALERT CARD at check-in to the Emergency Department and triage nurse.  Should you have questions after your visit or need to cancel or reschedule your appointment, please contact [facility] [phone]  and follow the prompts.  Office hours are 8:00 a.m. to 4:30 p.m. Monday - Friday. Please note that voicemails left after 4:00 p.m. may not be returned until the following business day.  We are closed weekends and major holidays. You have access to a nurse at all times for urgent questions. Please call the main number to the clinic [phone] and follow the prompts.  For any non-urgent questions, you may also contact your provider using MyChart. We now offer e-Visits for anyone 2 and older to request care online for non-urgent symptoms. For details visit [email or web address].   Also download the MyChart app! Go to the app store, search "MyChart", open the app, select [place], and log in with your MyChart username and password.

## 2022-10-30 NOTE — Progress Notes (Signed)
Scott [name] presented for Portacath access and flush. Proper placement of portacath confirmed by CXR. Portacath located right chest wall accessed with  H 20 needle. Good blood return present. Portacath flushed with 20ml NS and 500U/35ml Heparin and needle removed intact. Procedure without incident. Patient tolerated procedure well.

## 2022-11-05 NOTE — Progress Notes (Signed)
[facility] [address], [place] [address]    Clinic Day:  11/05/2022  Referring physician: Etta Grandchild, MD  Patient Care Team: Etta Grandchild, MD as PCP - General (Internal Medicine) Doreatha Massed, MD as Consulting Physician (Hematology and Oncology) Jena Gauss Gerrit Friends, MD as Consulting Physician (Gastroenterology) Therese Sarah, RN as Oncology Nurse Navigator (Medical Oncology)   ASSESSMENT & PLAN:   Assessment: 1.  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  Prominent left hydronephrosis and hydroureter noted.  New sclerotic lesion in the left anterior vertebral body of L1.  Mild left periaortic adenopathy increased from prior, short axis lymph node measuring 1 cm. -Last PSA increased to 15.8 on 09/28/2019. -As there is rapid progression of disease, I have recommended docetaxel chemotherapy.  He is agreeable after long discussion. -Cystoscopy with transurethral resection of tumor in the posterior bladder neck, right and left walls of the bladder, right lobe of the prostate on 11/10/2019. -Plan was to do 6 cycles of docetaxel followed by abiraterone and prednisone. -6 cycles of docetaxel from 11/16/2019 through 02/29/2020. -CTAP on 01/06/2020 showed persistent thickening of the urinary bladder wall.  Left hydronephrosis is significantly reduced compared to prior exam.  Left-sided retroperitoneal and left iliac lymph nodes reduced in size.  Interval increase in sclerosis of lesions of the left aspect of L2 vertebral body consistent with treatment response.  No new bone lesions. -Bone scan on 01/06/2020 shows stable L2 metastatic focus. -Bone scan scan images from 06/14/2020 which showed progressive metastatic disease, with increased size and intensity of L2 1 new focus of activity at  L1. 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 -Abiraterone and prednisone from 06/24/2020 through 09/06/2021 with progression. - PSMA PET scan (09/28/2021): Residual/recurrent tumor involving prostate gland and seminal vesicles.  Tracer avid bone metastasis localized L1 and L2 vertebral bodies.  No new sites of bone metastasis disease.  No solid organ or nodal mets. - Darolutamide from 10/17/2021 through 03/15/2022 with progression - Guardant360: MSI high not detected.  Androgen resistance present. - XRT to L2-L4, 30 Gray in 10 fractions completed on 04/23/2022 with improvement in pain. - Cabazitaxel 4 cycles from 05/07/2022 through 07/11/2022 with progression.   2.  Left hydroureteronephrosis: -CT scan showed prostate mass invading the posterior bladder and obstructing the ureter.  Creatinine increased to 1.1. -Renal ultrasound on 12/11/2019 showed interval resolution of previously seen left-sided hydronephrosis.  Previously seen soft tissue mass at the posterior bladder lumen no longer seen.    Plan: 1.  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  Overall worsening disease. - PSA is gradually worsening. - Labs today: CBC grossly normal with mild anemia.  Alk phos is elevated and rest of LFTs are normal.  Creatinine is normal. - He will proceed with Pluvicto first dose on 09/26/2022.  We discussed side effects in detail. - He will come back 1 week prior to second dose.   2.  Hot flashes: - Continue estradiol twice daily for hot flashes.   3.  Peripheral neuropathy: - Tingling in the left foot and fingertips is stable.   4.  Lower back pain/bilateral hip pains: - He reported worsening pain in the low back pain and bilateral hip pain. - Will increase tramadol to 100 mg  twice daily and hydrocodone at bedtime. - Will start him on prednisone 10 mg daily.  5.  Nausea: - Continue Compazine suppository every 8 hours as needed. - He lost  about 9 pounds due to decreased appetite and feeling queasy.    No orders of the defined types were placed in this encounter.     Alben Deeds Teague,acting as a Neurosurgeon for Doreatha Massed, MD.,have documented all relevant documentation on the behalf of Doreatha Massed, MD,as directed by  Doreatha Massed, MD while in the presence of Doreatha Massed, MD.  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    Prior Therapy: 1. TURP on 11/10/2019. 2. Docetaxel x 6 cycles, 11/16/2019 - 02/29/2020. 3.  Abiraterone and prednisone, 06/24/20 - 09/06/21 4.  Darolutamide, 10/17/21 - 03/15/22  Current Therapy:  Cabazitaxel every 21 days    HISTORY OF PRESENT ILLNESS:   Oncology History  Prostate cancer metastatic to multiple sites [facility])  01/25/2017 Initial Diagnosis   Prostate cancer metastatic to multiple sites [facility])   11/16/2019 - 03/02/2020 Chemotherapy   Patient is on Treatment Plan : PROSTATE Docetaxel + Prednisone q21d      Genetic Testing   Negative genetic testing. No pathogenic variants identified on the [facility] CancerNext-Expanded panel. 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 The report date is 04/19/2020.  The CancerNext-Expanded  gene panel offered by [facility] and includes sequencing and rearrangement analysis for the following 77 genes: IP, ALK, APC*, ATM*, AXIN2, BAP1, BARD1, BLM, BMPR1A, BRCA1*, BRCA2*, BRIP1*, CDC73, CDH1*,CDK4, CDKN1B, CDKN2A, CHEK2*, CTNNA1, DICER1, FANCC, FH, FLCN, GALNT12, KIF1B, LZTR1, MAX, MEN1, MET, MLH1*, MSH2*, MSH3, MSH6*, MUTYH*, NBN, NF1*, NF2, NTHL1, PALB2*, PHOX2B, PMS2*, POT1, PRKAR1A, PTCH1, PTEN*, RAD51C*, RAD51D*,RB1, RECQL, RET, SDHA, SDHAF2, SDHB, SDHC, SDHD, SMAD4, SMARCA4, SMARCB1, SMARCE1, STK11, SUFU, TMEM127, TP53*,TSC1, TSC2, VHL and XRCC2 (sequencing and  deletion/duplication); EGFR, EGLN1, HOXB13, KIT, MITF, PDGFRA, POLD1 and POLE (sequencing only); EPCAM and GREM1 (deletion/duplication only).    05/07/2022 -  Chemotherapy   Patient is on Treatment Plan : PROSTATE Cabazitaxel (20) D1 + Prednisone D1-21 q21d        INTERVAL HISTORY:   Nail is a 67 y.o. 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 He was last seen by me on 09/19/22.  Since his last visit, he presented to the ED on 8/5 for dizziness. He was found to be in A-fib with RVR and heart rate improved with 2 L of fluids and 12.5 of metoprolol.   Of note, he had his port inserted on 10/11/22.   Today, he states that he is doing well overall. His appetite level is at ***%. His energy level is at ***%.    PAST MEDICAL HISTORY:   Past Medical History: Past Medical History:  Diagnosis Date   Bladder obstruction    Complication of anesthesia    Diverticulitis 2015   Dyspnea    Essential hypertension, benign 01/26/2019   at doctor's office elevated, normal at home, no medications at this time   History of basal cell cancer    HLD (hyperlipidemia) 01/26/2019   Hypothyroidism    history of, not currently taking medication   PONV (postoperative nausea and vomiting)    Prostate cancer (HCC)    metastatic to bones   Vitamin D  deficiency disease 01/26/2019    Surgical History: Past Surgical History:  Procedure Laterality Date   CYSTOSCOPY W/ URETERAL STENT PLACEMENT Left 11/10/2019   Procedure: CYSTOSCOPY;  Surgeon: Bjorn Pippin, MD;  Location: [facility];  Service: Urology;  Laterality: Left;   ESOPHAGOGASTRODUODENOSCOPY (EGD) WITH PROPOFOL N/A 07/07/2020   Procedure: ESOPHAGOGASTRODUODENOSCOPY (EGD) WITH PROPOFOL;  Surgeon: Corbin Ade, MD;  Location: [facility];  Service: Endoscopy;  Laterality: N/A;  pm appt   HERNIA REPAIR  1995   UMBILICAL    IR FLUORO GUIDED NEEDLE PLC ASPIRATION/INJECTION LOC  04/09/2022   IR  IMAGING GUIDED PORT INSERTION  10/11/2022   MALONEY DILATION N/A 07/07/2020   Procedure: Elease Hashimoto DILATION;  Surgeon: Corbin Ade, MD;  Location: [facility];  Service: Endoscopy;  Laterality: N/A;   PROSTATE SURGERY     SKIN CANCER EXCISION     TONSILLECTOMY Bilateral    TOOTH EXTRACTION     front left tooth pulled but no surgery or stitches   TRANSURETHRAL RESECTION OF PROSTATE N/A 01/25/2017   Procedure: TRANSURETHRAL RESECTION OF THE PROSTATE (TURP);  Surgeon: Sebastian Ache, MD;  Location: [facility];  Service: Urology;  Laterality: N/A;   TRANSURETHRAL RESECTION OF PROSTATE N/A 11/10/2019   Procedure: TRANSURETHRAL RESECTION OF THE PROSTATE (TURP);  Surgeon: Bjorn Pippin, MD;  Location: [facility];  Service: Urology;  Laterality: N/A;   WISDOM TOOTH EXTRACTION      Social History: Social History   Socioeconomic History   Marital status: Married    Spouse name: Not on file   Number of children: 1   Years of education: Not on file   Highest education level: Bachelor's degree (e.g., BA, AB, BS)  Occupational History    Comment: working full time  Tobacco Use   Smoking status: Former    Current packs/day: 0.00    Average packs/day: 1.5 packs/day for 35.0 years (52.5 ttl pk-yrs)    Types: Cigarettes    Start date: 01/11/1969    Quit date: 01/12/2004    Years since quitting: 18.8   Smokeless tobacco: Never   Tobacco comments:    OVER 30 YEARS HX OF SMOKING   Vaping Use   Vaping status: Never Used  Substance and Sexual Activity   Alcohol use: Not Currently    Comment: heavy drinker until 2004   Drug use: Not Currently    Types: Marijuana   Sexual activity: Not Currently  Other Topics Concern   Not on file  Social History Narrative   Married for 19 years.Works for [employer] from home.   Social Determinants of Health   Financial Resource Strain: Low Risk  (08/24/2022)   Overall Financial Resource Strain (CARDIA)    Difficulty of Paying Living Expenses: Not very hard   Food Insecurity: No Food Insecurity (08/24/2022)   Hunger Vital Sign    Worried About Running Out of Food in the Last Year: Never true    Ran Out of Food in the Last Year: Never true  Transportation Needs: No Transportation Needs (08/24/2022)   PRAPARE - Administrator, Civil Service (Medical): No    Lack of Transportation (Non-Medical): No  Physical Activity: Insufficiently Active (08/24/2022)   Exercise Vital Sign    Days of Exercise per Week: 5 days    Minutes of Exercise per Session: 20 min  Stress: Stress Concern Present (08/24/2022)   [employer] of Occupational Health - Occupational Stress Questionnaire    Feeling of Stress : To some  extent  Social Connections: Socially Isolated (08/24/2022)   Social Connection and Isolation Panel [NHANES]    Frequency of Communication with Friends and Family: Once a week    Frequency of Social Gatherings with Friends and Family: Once a week    Attends Religious Services: Never    Database administrator or Organizations: No    Attends Engineer, structural: Not on file    Marital Status: Married  Catering manager Violence: Not At Risk (03/02/2020)   Humiliation, Afraid, Rape, and Kick questionnaire    Fear of Current or Ex-Partner: No    Emotionally Abused: No    Physically Abused: No    Sexually Abused: No    Family History: Family History  Problem Relation Age of Onset   Emphysema Mother    Alzheimer's disease Father    Prostate cancer Father        dx in his 24s   Prostate cancer Brother        dx in his 70s   Breast cancer Maternal Grandmother        60s   Pancreatic cancer Maternal Grandfather        28s   Dementia Paternal Grandmother    Heart attack Paternal Grandfather    Colon cancer Neg Hx     Current Medications:  Current Outpatient Medications:    arginine 500 MG tablet, Take 500 mg by mouth 2 (two) times daily., Disp: , Rfl:    Cholecalciferol 125 MCG (5000 UT) capsule, Take 5,000-10,000 Units  by mouth See admin instructions. Take 16109 units in the morning and 5000 units at night, Disp: , Rfl:    DANDELION ROOT PO, Take 500 mg by mouth daily., Disp: , Rfl:    dronabinol (MARINOL) 5 MG capsule, Take 1 capsule (5 mg total) by mouth 2 (two) times daily before a meal., Disp: 60 capsule, Rfl: 0   dutasteride (AVODART) 0.5 MG capsule, TAKE 1 CAPSULE(0.5 MG) BY MOUTH DAILY, Disp: 90 capsule, Rfl: 3   estradiol (VIVELLE-DOT) 0.1 MG/24HR patch, APPLY 1 PATCH(0.1 MG) TOPICALLY TO THE SKIN 2 TIMES A WEEK, Disp: 8 patch, Rfl: 12   HYDROcodone-acetaminophen (NORCO) 5-325 MG tablet, Take 1 tablet by mouth every 12 (twelve) hours as needed for moderate pain., Disp: 60 tablet, Rfl: 0   MAGNESIUM PO, Take 1 tablet by mouth at bedtime., Disp: , Rfl:    megestrol (MEGACE) 400 MG/10ML suspension, Take 10 mLs (400 mg total) by mouth 2 (two) times daily., Disp: 480 mL, Rfl: 0   MELATONIN PO, Take 40 mg by mouth at bedtime., Disp: , Rfl:    metoprolol tartrate (LOPRESSOR) 25 MG tablet, Take 0.5 tablets (12.5 mg total) by mouth 2 (two) times daily., Disp: 30 tablet, Rfl: 1   MILK THISTLE PO, Take 1 capsule by mouth daily., Disp: , Rfl:    NATTOKINASE PO, Take 2 capsules by mouth daily., Disp: , Rfl:    NP THYROID 120 MG tablet, Take 0.5 tablets (60 mg total) by mouth every morning., Disp: 45 tablet, Rfl: 1   olmesartan (BENICAR) 20 MG tablet, TAKE 1 TABLET(20 MG) BY MOUTH DAILY, Disp: 90 tablet, Rfl: 1   ondansetron (ZOFRAN ODT) 8 MG disintegrating tablet, Take 1 tablet (8 mg total) by mouth every 8 (eight) hours as needed for nausea or vomiting., Disp: 60 tablet, Rfl: 3   predniSONE (DELTASONE) 10 MG tablet, Take 1 tablet (10 mg total) by mouth daily., Disp: 30 tablet, Rfl: 3   predniSONE (  DELTASONE) 5 MG tablet, Take 1 tablet (5 mg total) by mouth daily with breakfast., Disp: 30 tablet, Rfl: 5   prochlorperazine (COMPAZINE) 25 MG suppository, Place 1 suppository (25 mg total) rectally every 8 (eight) hours as  needed for nausea or vomiting., Disp: 24 suppository, Rfl: 3   pyridOXINE (VITAMIN B6) 25 MG tablet, Take 25 mg by mouth daily., Disp: , Rfl:    QUERCETIN PO, Take 1 tablet by mouth 2 (two) times daily., Disp: , Rfl:    thiamine (VITAMIN B-1) 50 MG tablet, Take 1 tablet (50 mg total) by mouth daily., Disp: 90 tablet, Rfl: 1   traMADol (ULTRAM) 50 MG tablet, Take 2 tablets (100 mg total) by mouth every 6 (six) hours as needed., Disp: 120 tablet, Rfl: 0   VITAMIN K PO, Take 1 capsule by mouth every other day., Disp: , Rfl:    Zinc 30 MG CAPS, Take 30 mg by mouth daily., Disp: , Rfl:    Allergies: Allergies  Allergen Reactions   Crestor [Rosuvastatin] Other (See Comments)    myalgia   Ivp Dye [Iodinated Contrast Media] Swelling    Swelling on head, took Benadryl and swelling reduced   Wellbutrin [Bupropion] Hives    REVIEW OF SYSTEMS:   Review of Systems  Constitutional:  Negative for chills, fatigue and fever.  HENT:   Negative for lump/mass, mouth sores, nosebleeds, sore throat and trouble swallowing.   Eyes:  Negative for eye problems.  Respiratory:  Negative for cough and shortness of breath.   Cardiovascular:  Negative for chest pain, leg swelling and palpitations.  Gastrointestinal:  Negative for abdominal pain, constipation, diarrhea, nausea and vomiting.  Genitourinary:  Negative for bladder incontinence, difficulty urinating, dysuria, frequency, hematuria and nocturia.   Musculoskeletal:  Negative for arthralgias, back pain, flank pain, myalgias and neck pain.  Skin:  Negative for itching and rash.  Neurological:  Negative for dizziness, headaches and numbness.  Hematological:  Does not bruise/bleed easily.  Psychiatric/Behavioral:  Negative for depression, sleep disturbance and suicidal ideas. The patient is not nervous/anxious.   All other systems reviewed and are negative.    VITALS:   There were no vitals taken for this visit.  Wt Readings from Last 3 Encounters:   10/22/22 139 lb (63 kg)  09/19/22 161 lb 11.2 oz (73.3 kg)  08/27/22 170 lb (77.1 kg)    There is no height or weight on file to calculate BMI.  Performance status (ECOG): 1 - Symptomatic but completely ambulatory  PHYSICAL EXAM:   Physical Exam Vitals and nursing note reviewed. Exam conducted with a chaperone present.  Constitutional:      Appearance: Normal appearance.  Cardiovascular:     Rate and Rhythm: Normal rate and regular rhythm.     Pulses: Normal pulses.     Heart sounds: Normal heart sounds.  Pulmonary:     Effort: Pulmonary effort is normal.     Breath sounds: Normal breath sounds.  Abdominal:     Palpations: Abdomen is soft. There is no hepatomegaly, splenomegaly or mass.     Tenderness: There is no abdominal tenderness.  Musculoskeletal:     Right lower leg: No edema.     Left lower leg: No edema.  Lymphadenopathy:     Cervical: No cervical adenopathy.     Right cervical: No superficial, deep or posterior cervical adenopathy.    Left cervical: No superficial, deep or posterior cervical adenopathy.     Upper Body:     Right  upper body: No supraclavicular or axillary adenopathy.     Left upper body: No supraclavicular or axillary adenopathy.  Neurological:     General: No focal deficit present.     Mental Status: He is alert and oriented to person, place, and time.  Psychiatric:        Mood and Affect: Mood normal.        Behavior: Behavior normal.     LABS:      Latest Ref Rng & Units 10/30/2022    1:39 PM 10/22/2022   12:42 PM 10/09/2022   11:13 AM  CBC  WBC 4.0 - 10.5 K/uL 5.5  6.6  5.6   Hemoglobin 13.0 - 17.0 g/dL 40.9  81.1  91.4   Hematocrit 39.0 - 52.0 % 30.2  35.6  37.6   Platelets 150 - 400 K/uL 221  235  272       Latest Ref Rng & Units 10/30/2022    1:39 PM 10/22/2022   12:42 PM 10/09/2022   11:13 AM  CMP  Glucose 70 - 99 mg/dL 782  956  213   BUN 8 - 23 mg/dL 9  11  13    Creatinine 0.61 - 1.24 mg/dL 0.86  5.78  4.69   Sodium 135 -  145 mmol/L 126  124  127   Potassium 3.5 - 5.1 mmol/L 3.5  3.0  3.7   Chloride 98 - 111 mmol/L 97  93  94   CO2 22 - 32 mmol/L 19  21  20    Calcium 8.9 - 10.3 mg/dL 8.6  8.5  9.1   Total Protein 6.5 - 8.1 g/dL 6.0  6.8  7.1   Total Bilirubin 0.3 - 1.2 mg/dL 0.5  1.6  1.0   Alkaline Phos 38 - 126 U/L 890  1,283  676   AST 15 - 41 U/L 27  25  23    ALT 0 - 44 U/L 17  13  14       No results found for: "CEA1", "CEA" / No results found for: "CEA1", "CEA" Lab Results  Component Value Date   PSA1 12.9 (H) 05/26/2020   No results found for: "GEX528" No results found for: "CAN125"  No results found for: "TOTALPROTELP", "ALBUMINELP", "A1GS", "A2GS", "BETS", "BETA2SER", "GAMS", "MSPIKE", "SPEI" Lab Results  Component Value Date   TIBC 336.0 01/09/2021   TIBC 280 04/20/2020   FERRITIN 69.8 01/09/2021   FERRITIN 45 04/20/2020   IRONPCTSAT 45.2 01/09/2021   IRONPCTSAT 31 04/20/2020   No results found for: "LDH"   STUDIES:   DG Chest Portable 1 View  Result Date: 10/22/2022 CLINICAL DATA:  dizziness EXAM: PORTABLE CHEST 1 VIEW COMPARISON:  None Available. FINDINGS: Bilateral lung fields are clear. Bilateral costophrenic angles are clear. Normal cardio-mediastinal silhouette. Aortic arch calcifications noted. No acute osseous abnormalities. The soft tissues are within normal limits. Tube/lines: CT Port-A-Cath is seen with its tip overlying the lower portion of superior vena cava. IMPRESSION: 1. No active disease. Aortic Atherosclerosis (ICD10-I70.0). Electronically Signed   By: Jules Schick M.D.   On: 10/22/2022 13:34   IR IMAGING GUIDED PORT INSERTION  Result Date: 10/11/2022 INDICATION: 67 year old male with history of prostate cancer requiring central venous access for chemotherapy administration. EXAM: IMPLANTED PORT A CATH PLACEMENT WITH ULTRASOUND AND FLUOROSCOPIC GUIDANCE COMPARISON:  None Available. MEDICATIONS: None. ANESTHESIA/SEDATION: Moderate (conscious) sedation was employed  during this procedure. A total of Versed 2 mg and Fentanyl 100 mcg was administered intravenously. Moderate Sedation Time:  18 minutes. The patient's level of consciousness and vital signs were monitored continuously by radiology nursing throughout the procedure under my direct supervision. CONTRAST:  None FLUOROSCOPY TIME:  One mGy COMPLICATIONS: None immediate. PROCEDURE: The procedure, risks, benefits, and alternatives were explained to the patient. Questions regarding the procedure were encouraged and answered. The patient understands and consents to the procedure. The right neck and chest were prepped with chlorhexidine in a sterile fashion, and a sterile drape was applied covering the operative field. Maximum barrier sterile technique with sterile gowns and gloves were used for the procedure. A timeout was performed prior to the initiation of the procedure. Ultrasound was used to examine the jugular vein which was compressible and free of internal echoes. A skin marker was used to demarcate the planned venotomy and port pocket incision sites. Local anesthesia was provided to these sites and the subcutaneous tunnel track with 1% lidocaine with 1:100,000 epinephrine. A small incision was created at the jugular access site and blunt dissection was performed of the subcutaneous tissues. Under ultrasound guidance, the jugular vein was accessed with a 21 ga micropuncture needle and an 0.018" wire was inserted to the superior vena cava. Real-time ultrasound guidance was utilized for vascular access including the acquisition of a permanent ultrasound image documenting patency of the accessed vessel. A 5 Fr micopuncture set was then used, through which a 0.035" Rosen wire was passed under fluoroscopic guidance into the inferior vena cava. An 8 Fr dilator was then placed over the wire. A subcutaneous port pocket was then created along the upper chest wall utilizing a combination of sharp and blunt dissection. The pocket  was irrigated with sterile saline, packed with gauze, and observed for hemorrhage. A single lumen "ISP" sized power injectable port was chosen for placement. The 8 Fr catheter was tunneled from the port pocket site to the venotomy incision. The port was placed in the pocket. The external catheter was trimmed to appropriate length. The dilator was exchanged for an 8 Fr peel-away sheath under fluoroscopic guidance. The catheter was then placed through the sheath and the sheath was removed. Final catheter positioning was confirmed and documented with a fluoroscopic spot radiograph. The port was accessed with a Huber needle, aspirated, and flushed with heparinized saline. The deep dermal layer of the port pocket incision was closed with interrupted 3-0 Vicryl suture. Dermabond was then placed over the port pocket and neck incisions. The patient tolerated the procedure well without immediate post procedural complication. FINDINGS: After catheter placement, the tip lies within the superior cavoatrial junction. The catheter aspirates and flushes normally and is ready for immediate use. IMPRESSION: Successful placement of a power injectable Port-A-Cath via the right internal jugular vein. The catheter is ready for immediate use. Marliss Coots, MD [facility] Electronically Signed   By: Marliss Coots M.D.   On: 10/11/2022 15:18

## 2022-11-06 ENCOUNTER — Inpatient Hospital Stay: Payer: BC Managed Care – PPO | Admitting: Hematology

## 2022-11-06 VITALS — BP 113/82 | HR 92 | Temp 98.2°F | Resp 16 | Wt 144.4 lb

## 2022-11-06 DIAGNOSIS — C61 Malignant neoplasm of prostate: Secondary | ICD-10-CM | POA: Diagnosis not present

## 2022-11-06 MED ORDER — DEXAMETHASONE 4 MG PO TABS: 4.0000 mg | ORAL_TABLET | Freq: Every day | ORAL | 0 refills | Status: DC

## 2022-11-06 NOTE — Patient Instructions (Signed)
[facility] at [facility] Discharge Instructions   You were seen and examined today by Dr. Ellin Saba.  He reviewed the results of your lab work which are mostly normal/stable. Your PSA increased to over 1500. This could be a result of the treatment and the cancer cells breaking down. We will continue to monitor.   Continue with cycle 2 of Pluvicto. Please call the clinic if you feel you need to be seen or receive IV fluids.   We will send a prescription for a steroid called dexamethasone. You will take this every morning with food for three days after each treatment. This is to help control the nausea you are experiencing.   Return as scheduled.      Thank you for choosing [facility] at [facility] to provide your oncology and hematology care.  To afford each patient quality time with our provider, please arrive at least 15 minutes before your scheduled appointment time.   If you have a lab appointment with the Cancer Center please come in thru the Main Entrance and check in at the main information desk.  You need to re-schedule your appointment should you arrive 10 or more minutes late.  We strive to give you quality time with our providers, and arriving late affects you and other patients whose appointments are after yours.  Also, if you no show three or more times for appointments you may be dismissed from the clinic at the providers discretion.     Again, thank you for choosing [facility].  Our hope is that these requests will decrease the amount of time that you wait before being seen by our physicians.       _____________________________________________________________  Should you have questions after your visit to [facility], please contact our office at [phone] and follow the prompts.  Our office hours are 8:00 a.m. and 4:30 p.m. Monday - Friday.  Please note that voicemails left after 4:00 p.m. may not  be returned until the following business day.  We are closed weekends and major holidays.  You do have access to a nurse 24-7, just call the main number to the clinic [phone] and do not press any options, hold on the line and a nurse will answer the phone.    For prescription refill requests, have your pharmacy contact our office and allow 72 hours.    Due to Covid, you will need to wear a mask upon entering the hospital. If you do not have a mask, a mask will be given to you at the Main Entrance upon arrival. For doctor visits, patients may have 1 support person age 76 or older with them. For treatment visits, patients can not have anyone with them due to social distancing guidelines and our immunocompromised population.

## 2022-11-08 ENCOUNTER — Other Ambulatory Visit (HOSPITAL_COMMUNITY): Payer: BC Managed Care – PPO

## 2022-11-08 ENCOUNTER — Ambulatory Visit (HOSPITAL_COMMUNITY)
Admission: RE | Admit: 2022-11-08 | Discharge: 2022-11-08 | Disposition: A | Discharge status: Home / Self Care | Payer: BC Managed Care – PPO | Source: Ambulatory Visit | Attending: Hematology | Admitting: Hematology

## 2022-11-08 DIAGNOSIS — C7951 Secondary malignant neoplasm of bone: Secondary | ICD-10-CM | POA: Insufficient documentation

## 2022-11-08 DIAGNOSIS — C61 Malignant neoplasm of prostate: Secondary | ICD-10-CM | POA: Insufficient documentation

## 2022-11-08 MED ORDER — SODIUM CHLORIDE 0.9 % IV BOLUS: 1000.0000 mL | Freq: Once | INTRAVENOUS | Status: DC

## 2022-11-08 MED ORDER — SODIUM CHLORIDE 0.9 % IV SOLN
Freq: Once | INTRAVENOUS | Status: AC
  Filled 2022-11-08: qty 5

## 2022-11-08 MED ORDER — SODIUM CHLORIDE 0.9 % IV SOLN: INTRAVENOUS | Status: AC

## 2022-11-08 MED ORDER — PALONOSETRON HCL INJECTION 0.25 MG/5ML
0.2500 mg | Freq: Once | INTRAVENOUS | Status: AC
  Administered 2022-11-08: 0.25 mg via INTRAVENOUS
  Filled 2022-11-08: qty 5

## 2022-11-08 MED ORDER — LUTETIUM LU 177 VIPIVOTIDE TET 1000 MBQ/ML IV SOLN
218.5000 | Freq: Once | INTRAVENOUS | Status: AC
  Administered 2022-11-08: 218.5 via INTRAVENOUS

## 2022-11-08 NOTE — Progress Notes (Signed)
Tolerated treatment well.

## 2022-11-08 NOTE — Written Directive (Addendum)
  MOLECULAR IMAGING AND THERAPEUTICS WRITTEN DIRECTIVE   PATIENT NAME: Scott [name]  PT DOB:   12/15/1955                                              MRN: [id number]  ---------------------------------------------------------------------------------------------------------------------   PLUVICTO  THERAPY   RADIOPHARMACEUTICAL: Lutetium 177 vipivotide tetraxetan (Pluvicto)     PRESCRIBED DOSE FOR ADMINISTRATION:  200 mCi   ROUTE OFADMINISTRATION:  IV   DIAGNOSIS:  Prostate Cancer   REFERRING PHYSICIAN:   TREATMENT #:2   ADDITIONAL PHYSICIAN COMMENTS/NOTES:   AUTHORIZED USER SIGNATURE & TIME STAMP: Patriciaann Clan, MD   11/08/22    9:20 AM

## 2022-11-08 NOTE — Progress Notes (Signed)
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  Initial flush of the intravenous catheter was performed was sterile saline. The dose syringe was connected to the catheter and the Lu-177 Pluvicto administered over a 1 to 10 min infusion. Single 10 cc  lushes with normal saline follow the dose. No complications were noted. The entire IV tubing, venocatheter, stopcock and syringes was removed in total, placed in a disposal bag and sent for assay of the residual activity, which will be reported at a later time in our EMR by the physics staff. Pressure was applied to the venipuncture site, and a compression bandage placed. Patient monitored for 1 hour following infusion.    Radiation Safety personnel were present to perform the discharge survey, as detailed on their documentation. After a short period of observation, the patient had his IV removed.  RADIOPHARMACEUTICALS: [218.5] microcuries Lu-177 PLUVICTO  FINDINGS: Current Infusion: [2]  Planned Infusions: 6    Patient presented to nuclear medicine for treatment. The patient's most recent blood counts were reviewed and remains a good candidate to proceed with Lu-177 Pluvicto.       Patient experienced severe nausea and vomiting following initial treatment.  Patient ultimately to receive IV fluids from vomiting associated dehydration.    With this in mind, patient was pre-medicated with with antinausea medication including    Emend 150 mg, Aloxi 0.25 mg, and Decadron 10 mg.    Patient's PSA is significantly  elevated from comparison exam (potential flare response).     No renal toxicity.  Hemoglobin stable.      The patient  was situated in an infusion suite with a contact barrier placed under the arm. Intravenous access was established, using sterile technique, and a normal saline infusion from a syringe was started.   IMPRESSION: Current Infusion: [2]  Planned Infusions: 6    [The patient tolerated the infusion well. The patient will return in 6 weeks  for ongoing care.]

## 2022-11-12 ENCOUNTER — Other Ambulatory Visit: Payer: Self-pay | Admitting: Hematology

## 2022-11-13 ENCOUNTER — Other Ambulatory Visit: Payer: Self-pay | Admitting: Hematology

## 2022-11-14 ENCOUNTER — Encounter: Payer: Self-pay | Admitting: Hematology

## 2022-11-14 ENCOUNTER — Other Ambulatory Visit: Payer: Self-pay | Admitting: *Deleted

## 2022-11-14 DIAGNOSIS — C61 Malignant neoplasm of prostate: Secondary | ICD-10-CM

## 2022-11-14 MED ORDER — TRAMADOL HCL 50 MG PO TABS: 100.0000 mg | ORAL_TABLET | Freq: Four times a day (QID) | ORAL | 0 refills | Status: DC | PRN

## 2022-11-14 MED ORDER — HYDROCODONE-ACETAMINOPHEN 5-325 MG PO TABS
1.0000 | ORAL_TABLET | Freq: Two times a day (BID) | ORAL | 0 refills | Status: DC | PRN
Start: 2022-11-21 — End: 2022-11-23

## 2022-11-15 ENCOUNTER — Inpatient Hospital Stay: Payer: BC Managed Care – PPO | Admitting: Licensed Clinical Social Worker

## 2022-11-15 DIAGNOSIS — C61 Malignant neoplasm of prostate: Secondary | ICD-10-CM

## 2022-11-15 NOTE — Progress Notes (Signed)
[facility] CSW Progress Note  Clinical Social Worker  spoke w/ pt's wife regarding hospice services and the resources available through hospice.  Emotional support provided related to goals of care and end of life decision making.  CSW emailing link to [employer] to explore financial resources for insurance co-pay.  Contact information for [employer] included to explore Medicare inquiries.  Spouse added to caregiver support group.  CSW to remain available to provide support as appropriate.        Rachel Moulds, LCSW Clinical Social Worker [facility]

## 2022-11-23 ENCOUNTER — Other Ambulatory Visit: Payer: Self-pay

## 2022-11-23 DIAGNOSIS — C7951 Secondary malignant neoplasm of bone: Secondary | ICD-10-CM

## 2022-11-23 MED ORDER — HYDROCODONE-ACETAMINOPHEN 5-325 MG PO TABS
1.0000 | ORAL_TABLET | Freq: Four times a day (QID) | ORAL | 0 refills | Status: DC | PRN
Start: 2022-11-23 — End: 2023-01-02

## 2022-11-23 NOTE — Progress Notes (Signed)
Call received from patient's wife. Patient is having more pain. Having to take the pain medication as directed but is running out and having to wait to get refills. Wife is wanting to have something for breakthrough pain. He has trouble getting pain medication through [facility] and they are looking into switching to [employer] but she will let us know if they do. She is wanting something for breakthrough pain sent in that is stronger than tramadol because she doesn't want to have to go the the ER on the weekend or have to worry about him needing something and can't get the pharmacy to fill it.     Refill request sent to Dr. Ellin Saba. He is going to increase the frequency of medication to every 6 hours.

## 2022-12-04 ENCOUNTER — Telehealth: Payer: Self-pay

## 2022-12-04 ENCOUNTER — Other Ambulatory Visit: Payer: Self-pay

## 2022-12-04 ENCOUNTER — Encounter (HOSPITAL_COMMUNITY): Payer: Self-pay | Admitting: *Deleted

## 2022-12-04 ENCOUNTER — Emergency Department (HOSPITAL_COMMUNITY): Payer: BC Managed Care – PPO

## 2022-12-04 ENCOUNTER — Emergency Department (HOSPITAL_COMMUNITY)
Admission: EM | Admit: 2022-12-04 | Discharge: 2022-12-04 | Disposition: A | Discharge status: Home / Self Care | Payer: BC Managed Care – PPO | Attending: Student | Admitting: Student

## 2022-12-04 DIAGNOSIS — C61 Malignant neoplasm of prostate: Secondary | ICD-10-CM | POA: Diagnosis not present

## 2022-12-04 DIAGNOSIS — S0083XA Contusion of other part of head, initial encounter: Secondary | ICD-10-CM | POA: Insufficient documentation

## 2022-12-04 DIAGNOSIS — Y93K1 Activity, walking an animal: Secondary | ICD-10-CM | POA: Diagnosis not present

## 2022-12-04 DIAGNOSIS — S0990XA Unspecified injury of head, initial encounter: Secondary | ICD-10-CM | POA: Diagnosis present

## 2022-12-04 DIAGNOSIS — R350 Frequency of micturition: Secondary | ICD-10-CM | POA: Diagnosis not present

## 2022-12-04 DIAGNOSIS — R3915 Urgency of urination: Secondary | ICD-10-CM | POA: Diagnosis not present

## 2022-12-04 DIAGNOSIS — R600 Localized edema: Secondary | ICD-10-CM | POA: Diagnosis not present

## 2022-12-04 DIAGNOSIS — W19XXXA Unspecified fall, initial encounter: Secondary | ICD-10-CM | POA: Diagnosis not present

## 2022-12-04 LAB — CBC WITH DIFFERENTIAL/PLATELET
Abs Immature Granulocytes: 0.14 10*3/uL — ABNORMAL HIGH (ref 0.00–0.07)
Basophils Absolute: 0.1 10*3/uL (ref 0.0–0.1)
Basophils Relative: 1 %
Eosinophils Absolute: 0 10*3/uL (ref 0.0–0.5)
Eosinophils Relative: 0 %
HCT: 33.9 % — ABNORMAL LOW (ref 39.0–52.0)
Hemoglobin: 11.5 g/dL — ABNORMAL LOW (ref 13.0–17.0)
Immature Granulocytes: 3 %
Lymphocytes Relative: 16 %
Lymphs Abs: 0.9 10*3/uL (ref 0.7–4.0)
MCH: 31.2 pg (ref 26.0–34.0)
MCHC: 33.9 g/dL (ref 30.0–36.0)
MCV: 91.9 fL (ref 80.0–100.0)
Monocytes Absolute: 0.6 10*3/uL (ref 0.1–1.0)
Monocytes Relative: 10 %
Neutro Abs: 4 10*3/uL (ref 1.7–7.7)
Neutrophils Relative %: 70 %
Platelets: 289 10*3/uL (ref 150–400)
RBC: 3.69 MIL/uL — ABNORMAL LOW (ref 4.22–5.81)
RDW: 16.9 % — ABNORMAL HIGH (ref 11.5–15.5)
WBC: 5.7 10*3/uL (ref 4.0–10.5)
nRBC: 0 % (ref 0.0–0.2)

## 2022-12-04 LAB — COMPREHENSIVE METABOLIC PANEL
ALT: 47 U/L — ABNORMAL HIGH (ref 0–44)
AST: 26 U/L (ref 15–41)
Albumin: 3.4 g/dL — ABNORMAL LOW (ref 3.5–5.0)
Alkaline Phosphatase: 913 U/L — ABNORMAL HIGH (ref 38–126)
Anion gap: 9 (ref 5–15)
BUN: 11 mg/dL (ref 8–23)
CO2: 22 mmol/L (ref 22–32)
Calcium: 8.2 mg/dL — ABNORMAL LOW (ref 8.9–10.3)
Chloride: 99 mmol/L (ref 98–111)
Creatinine, Ser: 0.62 mg/dL (ref 0.61–1.24)
GFR, Estimated: >60 mL/min (ref >60)
Glucose, Bld: 99 mg/dL (ref 70–99)
Potassium: 3.9 mmol/L (ref 3.5–5.1)
Sodium: 130 mmol/L — ABNORMAL LOW (ref 135–145)
Total Bilirubin: 0.8 mg/dL (ref 0.3–1.2)
Total Protein: 6.6 g/dL (ref 6.5–8.1)

## 2022-12-04 LAB — URINALYSIS, ROUTINE W REFLEX MICROSCOPIC
Bacteria, UA: NONE SEEN
Bilirubin Urine: NEGATIVE
Glucose, UA: NEGATIVE mg/dL
Hgb urine dipstick: NEGATIVE
Ketones, ur: NEGATIVE mg/dL
Leukocytes,Ua: NEGATIVE
Nitrite: NEGATIVE
Protein, ur: 30 mg/dL — AB
Specific Gravity, Urine: 1.023 (ref 1.005–1.030)
pH: 5 (ref 5.0–8.0)

## 2022-12-04 LAB — BRAIN NATRIURETIC PEPTIDE: B Natriuretic Peptide: 271 pg/mL — ABNORMAL HIGH (ref 0.0–100.0)

## 2022-12-04 MED ORDER — B-4 MED COMPRESSION HOSE MENS MISC: 0 refills | Status: DC

## 2022-12-04 MED ORDER — GADOBUTROL 1 MMOL/ML IV SOLN
7.0000 mL | Freq: Once | INTRAVENOUS | Status: AC | PRN
  Administered 2022-12-04: 7 mL via INTRAVENOUS

## 2022-12-04 MED ORDER — PHENAZOPYRIDINE HCL 100 MG PO TABS
200.0000 mg | ORAL_TABLET | Freq: Once | ORAL | Status: AC
  Administered 2022-12-04: 200 mg via ORAL

## 2022-12-04 MED ORDER — PHENAZOPYRIDINE HCL 100 MG PO TABS
ORAL_TABLET | ORAL | Status: AC
  Filled 2022-12-04: qty 2

## 2022-12-04 MED ORDER — PHENAZOPYRIDINE HCL 200 MG PO TABS: 200.0000 mg | ORAL_TABLET | Freq: Three times a day (TID) | ORAL | 0 refills | Status: DC

## 2022-12-04 NOTE — ED Triage Notes (Signed)
Pt with pain with urination that started last night. Pt with stage 4 prostate CA.  Swelling to bilateral ankles over the weekend, pitting per family member.  Pt fell and has a bruise to left cheek after taking the dog out and dog pulled pt down.

## 2022-12-04 NOTE — ED Provider Notes (Signed)
[place] [facility] AT [facility] Provider Note   CSN: [id number] Arrival date & time: 12/04/22  1247     History {Add pertinent medical, surgical, social history, OB history to HPI:1} Chief Complaint  Patient presents with   Dysuria    Scott [name] is a 67 y.o. male  with complains of increased urinary frequency of small amounts of urine since last night along with increased lower extremity edema over the past 4 days.  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  He is no longer on chemotherapy but is receiving a nuclear medicine therapy pluvicto for specific treatment of his bony metastasis.  He has concerns that he is having increased bladder complications with today's symptoms stating the last time he felt this way he ended up having a bladder metastasis that required surgical excision.  He denies hematuria, denies nausea or vomiting, fevers, flank pain.He denies sob or chest pain.   He reached out to Dr. Ellin Saba who advised his presentation here.  As an aside, the patient had a trip and fall over a week ago when he was walking his dog, the dog pulled on his leash and he fell down causing bruising to his left cheek.  He has no residual symptoms regarding this injury.  The history is provided by the patient.       Home Medications Prior to Admission medications   Medication Sig Start Date End Date Taking? Authorizing Provider  arginine 500 MG tablet Take 500 mg by mouth 2 (two) times daily.    [provider]  Cholecalciferol 125 MCG (5000 UT) capsule Take 5,000-10,000 Units by mouth See admin instructions. Take 40981 units in the morning and 5000 units at night    [provider]  DANDELION ROOT PO Take 500 mg by mouth daily.    [provider]  dexamethasone (DECADRON) 4 MG tablet Take 1 tablet (4 mg total) by mouth daily. Take for three days after each Pluvicto treatment 11/06/22    Doreatha Massed, MD  dronabinol (MARINOL) 5 MG capsule TAKE 1 CAPSULE(5 MG) BY MOUTH TWICE DAILY BEFORE A MEAL 11/12/22   Doreatha Massed, MD  dutasteride (AVODART) 0.5 MG capsule TAKE 1 CAPSULE(0.5 MG) BY MOUTH DAILY 10/17/22   Bjorn Pippin, MD  estradiol (VIVELLE-DOT) 0.1 MG/24HR patch APPLY 1 PATCH(0.1 MG) TOPICALLY TO THE SKIN 2 TIMES A WEEK 10/19/22   Doreatha Massed, MD  HYDROcodone-acetaminophen (NORCO) 5-325 MG tablet Take 1 tablet by mouth every 6 (six) hours as needed for moderate pain. 11/23/22   Doreatha Massed, MD  MAGNESIUM PO Take 1 tablet by mouth at bedtime.    [provider]  megestrol (MEGACE) 40 MG/ML suspension SHAKE LIQUID AND TAKE 10 ML(400 MG) BY MOUTH TWICE DAILY 11/12/22   Doreatha Massed, MD  MELATONIN PO Take 40 mg by mouth at bedtime.    [provider]  metoprolol tartrate (LOPRESSOR) 25 MG tablet Take 0.5 tablets (12.5 mg total) by mouth 2 (two) times daily. 10/22/22   Rondel Baton, MD  MILK THISTLE PO Take 1 capsule by mouth daily.    [provider]  NATTOKINASE PO Take 2 capsules by mouth daily.    [provider]  NP THYROID 120 MG tablet Take 0.5 tablets (60 mg total) by mouth every morning. 09/02/22   Etta Grandchild, MD  olmesartan (BENICAR) 20 MG tablet TAKE 1 TABLET(20 MG) BY MOUTH DAILY 09/02/22   Yetta Barre,  Bernadene Bell, MD  ondansetron (ZOFRAN ODT) 8 MG disintegrating tablet Take 1 tablet (8 mg total) by mouth every 8 (eight) hours as needed for nausea or vomiting. 11/29/21   Doreatha Massed, MD  prochlorperazine (COMPAZINE) 10 MG tablet Take 10 mg by mouth every 6 (six) hours. 10/26/22   [provider]  prochlorperazine (COMPAZINE) 25 MG suppository Place 1 suppository (25 mg total) rectally every 8 (eight) hours as needed for nausea or vomiting. 10/05/22   Doreatha Massed, MD  pyridOXINE (VITAMIN B6) 25 MG tablet Take 25 mg by mouth daily.    [provider]  QUERCETIN PO Take 1  tablet by mouth 2 (two) times daily.    [provider]  thiamine (VITAMIN B-1) 50 MG tablet Take 1 tablet (50 mg total) by mouth daily. 08/27/22   Etta Grandchild, MD  traMADol (ULTRAM) 50 MG tablet Take 2 tablets (100 mg total) by mouth every 6 (six) hours as needed. 11/14/22   Doreatha Massed, MD  VITAMIN K PO Take 1 capsule by mouth every other day.    [provider]  Zinc 30 MG CAPS Take 30 mg by mouth daily.    [provider]      Allergies    Crestor [rosuvastatin], Ivp dye [iodinated contrast media], and Wellbutrin [bupropion]    Review of Systems   Review of Systems  Constitutional:  Negative for fever.  HENT:  Negative for congestion and sore throat.   Eyes: Negative.   Respiratory:  Negative for cough, chest tightness, shortness of breath, wheezing and stridor.        No orthopnea  Cardiovascular:  Positive for leg swelling. Negative for chest pain.  Gastrointestinal:  Negative for abdominal pain and nausea.  Genitourinary:  Positive for frequency. Negative for dysuria, flank pain and urgency.  Musculoskeletal:  Negative for arthralgias, joint swelling and neck pain.  Skin: Negative.  Negative for rash and wound.  Neurological:  Negative for dizziness, weakness, light-headedness, numbness and headaches.  Psychiatric/Behavioral: Negative.      Physical Exam Updated Vital Signs BP 130/80 (BP Location: Right Arm)   Pulse 80   Temp 98.3 F (36.8 C) (Oral)   Resp 18   Ht 5\' 6"  (1.676 m)   Wt 67.6 kg   SpO2 99%   BMI 24.05 kg/m  Physical Exam Vitals and nursing note reviewed.  Constitutional:      Appearance: He is well-developed.  HENT:     Head: Normocephalic and atraumatic.  Eyes:     Conjunctiva/sclera: Conjunctivae normal.  Cardiovascular:     Rate and Rhythm: Normal rate and regular rhythm.     Heart sounds: Normal heart sounds.  Pulmonary:     Effort: Pulmonary effort is normal.     Breath sounds: Normal breath sounds. No  wheezing or rales.  Abdominal:     General: Bowel sounds are normal.     Palpations: Abdomen is soft.     Tenderness: There is no abdominal tenderness.  Musculoskeletal:        General: Normal range of motion.     Cervical back: Normal range of motion.     Comments: Trace bilateral ankle edema, nonpitting.  Per patient report it was much more severe earlier, but has been sitting with legs propped up since arrival in the ED.  Skin:    General: Skin is warm and dry.  Neurological:     Mental Status: He is alert.     ED Results /  Procedures / Treatments   Labs (all labs ordered are listed, but only abnormal results are displayed) Labs Reviewed  URINALYSIS, ROUTINE W REFLEX MICROSCOPIC - Abnormal; Notable for the following components:      Result Value   Protein, ur 30 (*)    All other components within normal limits  COMPREHENSIVE METABOLIC PANEL - Abnormal; Notable for the following components:   Sodium 130 (*)    Calcium 8.2 (*)    Albumin 3.4 (*)    ALT 47 (*)    Alkaline Phosphatase 913 (*)    All other components within normal limits  CBC WITH DIFFERENTIAL/PLATELET - Abnormal; Notable for the following components:   RBC 3.69 (*)    Hemoglobin 11.5 (*)    HCT 33.9 (*)    RDW 16.9 (*)    Abs Immature Granulocytes 0.14 (*)    All other components within normal limits  BRAIN NATRIURETIC PEPTIDE - Abnormal; Notable for the following components:   B Natriuretic Peptide 271.0 (*)    All other components within normal limits    EKG None  Radiology MR PELVIS W WO CONTRAST  Result Date: 12/04/2022 CLINICAL DATA:  Bladder mass, status post TURP, history of prostate cancer EXAM: MRI PELVIS WITHOUT AND WITH CONTRAST TECHNIQUE: Multiplanar multisequence MR imaging of the pelvis was performed both before and after administration of intravenous contrast. CONTRAST:  7mL GADAVIST GADOBUTROL 1 MMOL/ML IV SOLN COMPARISON:  CT abdomen/pelvis dated 12/04/2022 FINDINGS: Urinary Tract:  Bladder is mildly thick-walled although underdistended. Soft tissue along the bladder base, described below. Bowel:  Sigmoid diverticulosis, without evidence of diverticulitis. Vascular/Lymphatic: No evidence of abdominal aortic aneurysm. No suspicious abdominal lymphadenopathy. 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 Additional enhancing tumor involving the base of the seminal vesicles (series 8/image 51). Other:  Trace presacral fluid. Musculoskeletal: Multifocal osseous metastases. 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 If there is concern for primary bladder carcinoma, consider cystoscopy. Additional enhancing tumor involving the base of the seminal vesicles. Multifocal osseous metastases. Electronically Signed   By: Charline Bills M.D.   On: 12/04/2022 20:14   CT ABDOMEN PELVIS WO CONTRAST  Result Date: 12/04/2022 CLINICAL DATA:  Bladder dysfunction. History of metastatic prostate cancer. Concern for bladder mass. * Tracking Code: BO * EXAM: CT ABDOMEN AND PELVIS WITHOUT CONTRAST TECHNIQUE: Multidetector CT imaging of the abdomen and pelvis was performed following the standard protocol without IV contrast. RADIATION DOSE REDUCTION: This exam was performed according to the departmental dose-optimization program which includes automated exposure control, adjustment of the mA and/or kV according to patient size and/or use of iterative reconstruction technique. COMPARISON:  CT 07/26/2022 FINDINGS: Lower chest: 4 mm subpleural left lower lobe lung nodule on image 40 of series 4. This measured 3 mm previously, similar. There is some bilateral lung base areas of atelectasis. Breathing motion. Stable 2-3 mm nodule subpleural right lower lobe series 4, image 17 as  well. No pleural effusion. Gynecomastia. Hepatobiliary: Stones in the nondilated gallbladder. On this non IV contrast exam, no clear space-occupying liver lesion. Evaluation for solid organ pathology is limited without the advantage of IV contrast. Pancreas: Global mild atrophy of the pancreas particularly the head neck and body region. Unchanged from previous. Spleen: Normal in size without focal abnormality. Adrenals/Urinary Tract: Punctate calcification along the right adrenal gland.  Stable. Normal left adrenal. No abnormal calcification seen within either kidney nor along the course of either ureter. Slight ectasia of the distal ureters, nonspecific. Preserved contours of the urinary bladder with some wall thickening and stranding. Stomach/Bowel: There is fluid and debris along the stomach. Large bowel has a normal course and caliber with moderate colonic stool. Few distal colonic diverticula. The appendix is grossly preserved in the right lower quadrant and is nondilated. Small bowel is nondilated. Vascular/Lymphatic: Aortic atherosclerosis. No enlarged abdominal or pelvic lymph nodes. Reproductive: Lobular prostate with TURP defect and calcifications. Previously there was a enhancing nodule along the margin of the TURP defect inferiorly. This has less well defined without the advantage of IV contrast but the contour deformity is still present on series 2, image 74. Additional evaluation as clinically appropriate. MRI may be of some benefit as an option Other: Nonspecific presacral fat stranding, increased from previous. Small bilateral fat containing inguinal hernias. Epigastric midline anterior abdominal wall moderate fat containing hernia. The fat in the hernia sac is with some stranding. This was seen previously. Musculoskeletal: Extensive sclerotic bone metastases again identified. Comparing current to prior the extent distribution of the areas is markedly increased throughout the pelvis, proximal femurs,  spine and ribs. There is also sternal lesions. 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 Nodular prostate with mass effect along the bladder. Stable bladder wall thickening and stranding. The nodular area in the area of the TURP defect of the prostate on the prior is suggested again today but less well seen without the advantage of IV contrast. Additional workup is recommended as clinically appropriate such as MRI. Increasing presacral fat stranding. Gallstones. Gynecomastia. Tiny lung nodules. Additional follow up as per the patient's neoplasm. Electronically Signed   By: Karen Kays M.D.   On: 12/04/2022 17:52   CT HEAD WO CONTRAST ( )  Result Date: 12/04/2022 CLINICAL DATA:  Trauma, pain EXAM: CT HEAD WITHOUT CONTRAST CT MAXILLOFACIAL WITHOUT CONTRAST CT CERVICAL SPINE WITHOUT CONTRAST TECHNIQUE: Multidetector CT imaging of the head, cervical spine, and maxillofacial structures were performed using the standard protocol without intravenous contrast. Multiplanar CT image reconstructions of the cervical spine and maxillofacial structures were also generated. RADIATION DOSE REDUCTION: This exam was performed according to the departmental dose-optimization program which includes automated exposure control, adjustment of the mA and/or kV according to patient size and/or use of iterative reconstruction technique. COMPARISON:  None Available. FINDINGS: CT HEAD FINDINGS Brain: There is no acute intracranial hemorrhage, extra-axial fluid collection, or acute territorial infarct. Parenchymal volume is normal. The ventricles are normal in size. There is an age-indeterminate small infarct in the right basal ganglia/corona radiata. The pituitary and suprasellar region are normal. There is no mass lesion. There is no mass effect or midline shift. Vascular: No hyperdense vessel or unexpected calcification. Skull: There is osseous  metastatic disease in the clivus and bilateral sphenoid bones. Other: The mastoid air cells and middle ear cavities are clear. CT MAXILLOFACIAL FINDINGS Osseous: There is no acute facial bone fracture. There is no evidence of mandibular dislocation. There is abnormal sclerosis in the bilateral sphenoid bones consistent with osseous metastatic disease. No lytic destructive lesion is seen in the facial bones. Orbits: The globes and orbits are unremarkable. Sinuses: There is a small retention cyst in the left maxillary sinus. Soft tissues: Unremarkable. CT CERVICAL SPINE FINDINGS Alignment: There is mild anterolisthesis of C3 on C4 and retrolisthesis of  C4 on C5, likely degenerative. There is no evidence of traumatic malalignment. Skull base and vertebrae: Skull base alignment is maintained. Vertebral body heights are preserved. There is no evidence of acute fracture. There is extensive sclerotic osseous metastatic disease. Soft tissues and spinal canal: No prevertebral fluid or swelling. No visible canal hematoma. Disc levels: There is moderate disc space narrowing and degenerative endplate change at C4-C5 through C6-C7 and moderate left facet arthropathy at C3-C4 there is no evidence of high-grade spinal canal stenosis. Upper chest: The imaged lung apices are clear. A right chest wall port is partially imaged. Other: None. IMPRESSION: 1. No acute intracranial hemorrhage or calvarial fracture. 2. 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 3. No acute facial bone fracture. 4. No acute fracture or traumatic malalignment of the cervical spine. 5. Extensive sclerotic osseous metastatic disease. Electronically Signed   By: Lesia Hausen M.D.   On: 12/04/2022 17:12   CT CERVICAL SPINE WO CONTRAST  Result Date: 12/04/2022 CLINICAL DATA:  Trauma, pain EXAM: CT HEAD WITHOUT CONTRAST CT MAXILLOFACIAL WITHOUT CONTRAST CT CERVICAL SPINE WITHOUT CONTRAST TECHNIQUE: Multidetector CT imaging of the  head, cervical spine, and maxillofacial structures were performed using the standard protocol without intravenous contrast. Multiplanar CT image reconstructions of the cervical spine and maxillofacial structures were also generated. RADIATION DOSE REDUCTION: This exam was performed according to the departmental dose-optimization program which includes automated exposure control, adjustment of the mA and/or kV according to patient size and/or use of iterative reconstruction technique. COMPARISON:  None Available. FINDINGS: CT HEAD FINDINGS Brain: There is no acute intracranial hemorrhage, extra-axial fluid collection, or acute territorial infarct. Parenchymal volume is normal. The ventricles are normal in size. There is an age-indeterminate small infarct in the right basal ganglia/corona radiata. The pituitary and suprasellar region are normal. There is no mass lesion. There is no mass effect or midline shift. Vascular: No hyperdense vessel or unexpected calcification. Skull: There is osseous metastatic disease in the clivus and bilateral sphenoid bones. Other: The mastoid air cells and middle ear cavities are clear. CT MAXILLOFACIAL FINDINGS Osseous: There is no acute facial bone fracture. There is no evidence of mandibular dislocation. There is abnormal sclerosis in the bilateral sphenoid bones consistent with osseous metastatic disease. No lytic destructive lesion is seen in the facial bones. Orbits: The globes and orbits are unremarkable. Sinuses: There is a small retention cyst in the left maxillary sinus. Soft tissues: Unremarkable. CT CERVICAL SPINE FINDINGS Alignment: There is mild anterolisthesis of C3 on C4 and retrolisthesis of C4 on C5, likely degenerative. There is no evidence of traumatic malalignment. Skull base and vertebrae: Skull base alignment is maintained. Vertebral body heights are preserved. There is no evidence of acute fracture. There is extensive sclerotic osseous metastatic disease. Soft  tissues and spinal canal: No prevertebral fluid or swelling. No visible canal hematoma. Disc levels: There is moderate disc space narrowing and degenerative endplate change at C4-C5 through C6-C7 and moderate left facet arthropathy at C3-C4 there is no evidence of high-grade spinal canal stenosis. Upper chest: The imaged lung apices are clear. A right chest wall port is partially imaged. Other: None. IMPRESSION: 1. No acute intracranial hemorrhage or calvarial fracture. 2. 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 3. No acute facial bone fracture. 4. No acute fracture or traumatic malalignment of the cervical spine. 5. Extensive sclerotic osseous metastatic disease. Electronically Signed   By: Lesia Hausen M.D.   On: 12/04/2022 17:12   CT Maxillofacial  Wo Contrast  Result Date: 12/04/2022 CLINICAL DATA:  Trauma, pain EXAM: CT HEAD WITHOUT CONTRAST CT MAXILLOFACIAL WITHOUT CONTRAST CT CERVICAL SPINE WITHOUT CONTRAST TECHNIQUE: Multidetector CT imaging of the head, cervical spine, and maxillofacial structures were performed using the standard protocol without intravenous contrast. Multiplanar CT image reconstructions of the cervical spine and maxillofacial structures were also generated. RADIATION DOSE REDUCTION: This exam was performed according to the departmental dose-optimization program which includes automated exposure control, adjustment of the mA and/or kV according to patient size and/or use of iterative reconstruction technique. COMPARISON:  None Available. FINDINGS: CT HEAD FINDINGS Brain: There is no acute intracranial hemorrhage, extra-axial fluid collection, or acute territorial infarct. Parenchymal volume is normal. The ventricles are normal in size. There is an age-indeterminate small infarct in the right basal ganglia/corona radiata. The pituitary and suprasellar region are normal. There is no mass lesion. There is no mass effect or midline shift. Vascular: No  hyperdense vessel or unexpected calcification. Skull: There is osseous metastatic disease in the clivus and bilateral sphenoid bones. Other: The mastoid air cells and middle ear cavities are clear. CT MAXILLOFACIAL FINDINGS Osseous: There is no acute facial bone fracture. There is no evidence of mandibular dislocation. There is abnormal sclerosis in the bilateral sphenoid bones consistent with osseous metastatic disease. No lytic destructive lesion is seen in the facial bones. Orbits: The globes and orbits are unremarkable. Sinuses: There is a small retention cyst in the left maxillary sinus. Soft tissues: Unremarkable. CT CERVICAL SPINE FINDINGS Alignment: There is mild anterolisthesis of C3 on C4 and retrolisthesis of C4 on C5, likely degenerative. There is no evidence of traumatic malalignment. Skull base and vertebrae: Skull base alignment is maintained. Vertebral body heights are preserved. There is no evidence of acute fracture. There is extensive sclerotic osseous metastatic disease. Soft tissues and spinal canal: No prevertebral fluid or swelling. No visible canal hematoma. Disc levels: There is moderate disc space narrowing and degenerative endplate change at C4-C5 through C6-C7 and moderate left facet arthropathy at C3-C4 there is no evidence of high-grade spinal canal stenosis. Upper chest: The imaged lung apices are clear. A right chest wall port is partially imaged. Other: None. IMPRESSION: 1. No acute intracranial hemorrhage or calvarial fracture. 2. 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 3. No acute facial bone fracture. 4. No acute fracture or traumatic malalignment of the cervical spine. 5. Extensive sclerotic osseous metastatic disease. Electronically Signed   By: Lesia Hausen M.D.   On: 12/04/2022 17:12    Procedures Procedures  {Document cardiac monitor, telemetry assessment procedure when appropriate:1}  Medications Ordered in ED Medications   gadobutrol (GADAVIST) 1 MMOL/ML injection 7 mL (7 mLs Intravenous Contrast Given 12/04/22 1910)    ED Course/ Medical Decision Making/ A&P   {   Click here for ABCD2, HEART and other calculatorsREFRESH Note before signing :1}                              Medical Decision Making Amount and/or Complexity of Data Reviewed Labs: ordered. Radiology: ordered.  Risk Prescription drug management.   ***  {Document critical care time when appropriate:1} {Document review of labs and clinical decision tools ie heart score, Chads2Vasc2 etc:1}  {Document your independent review of radiology images, and any outside records:1} {Document your discussion with family members, caretakers, and with consultants:1} {Document social determinants of health affecting pt's care:1} {Document your decision making why or why not  admission, treatments were needed:1} Final Clinical Impression(s) / ED Diagnoses Final diagnoses:  None    Rx / DC Orders ED Discharge Orders     None

## 2022-12-04 NOTE — Telephone Encounter (Signed)
Patient's wife called stating patient started having edema in both ankles and now is pitting. Patient started getting up at 3 A.M. this morning and has been to the restroom an estimated 35 times with a low output of urine.  Dr. Elbert Ewings made aware and suggest patient go to ED.  Patient's wife called and made aware of physician's decision. Patient's wife was in agreement.

## 2022-12-04 NOTE — Discharge Instructions (Signed)
You are being prescribed a medication which may help with your urinary frequency, this medication will turn your urine bright fluorescent yellow-orange and is a normal side effect while you are on this medication.  Your oncology office should call you in the morning to arrange office follow-up this week.  Please call them if you have not heard from them by the end of the day tomorrow.  As discussed there is no evidence for any blockages in your bladder causing your symptoms today.  There may be a small bladder tumor which may require further assistance by Dr. Annabell Howells, however your oncologist should guide you further when you see them.

## 2022-12-06 ENCOUNTER — Telehealth: Payer: Self-pay

## 2022-12-06 ENCOUNTER — Inpatient Hospital Stay: Payer: BC Managed Care – PPO

## 2022-12-06 ENCOUNTER — Inpatient Hospital Stay: Payer: BC Managed Care – PPO | Attending: Hematology | Admitting: Oncology

## 2022-12-06 VITALS — BP 128/73 | HR 74 | Temp 97.8°F | Resp 16 | Wt 150.8 lb

## 2022-12-06 DIAGNOSIS — Z803 Family history of malignant neoplasm of breast: Secondary | ICD-10-CM | POA: Diagnosis not present

## 2022-12-06 DIAGNOSIS — Z8042 Family history of malignant neoplasm of prostate: Secondary | ICD-10-CM | POA: Diagnosis not present

## 2022-12-06 DIAGNOSIS — M25551 Pain in right hip: Secondary | ICD-10-CM | POA: Diagnosis not present

## 2022-12-06 DIAGNOSIS — C779 Secondary and unspecified malignant neoplasm of lymph node, unspecified: Secondary | ICD-10-CM | POA: Diagnosis not present

## 2022-12-06 DIAGNOSIS — R232 Flushing: Secondary | ICD-10-CM | POA: Insufficient documentation

## 2022-12-06 DIAGNOSIS — M25552 Pain in left hip: Secondary | ICD-10-CM | POA: Insufficient documentation

## 2022-12-06 DIAGNOSIS — Z87891 Personal history of nicotine dependence: Secondary | ICD-10-CM | POA: Insufficient documentation

## 2022-12-06 DIAGNOSIS — C61 Malignant neoplasm of prostate: Secondary | ICD-10-CM | POA: Diagnosis present

## 2022-12-06 DIAGNOSIS — M545 Low back pain, unspecified: Secondary | ICD-10-CM | POA: Insufficient documentation

## 2022-12-06 DIAGNOSIS — R112 Nausea with vomiting, unspecified: Secondary | ICD-10-CM | POA: Insufficient documentation

## 2022-12-06 DIAGNOSIS — C7951 Secondary malignant neoplasm of bone: Secondary | ICD-10-CM | POA: Insufficient documentation

## 2022-12-06 DIAGNOSIS — N1330 Unspecified hydronephrosis: Secondary | ICD-10-CM | POA: Diagnosis not present

## 2022-12-06 DIAGNOSIS — R300 Dysuria: Secondary | ICD-10-CM | POA: Diagnosis not present

## 2022-12-06 DIAGNOSIS — Z800 Family history of malignant neoplasm of digestive organs: Secondary | ICD-10-CM | POA: Diagnosis not present

## 2022-12-06 LAB — CBC WITH DIFFERENTIAL/PLATELET
Abs Immature Granulocytes: 0.1 10*3/uL — ABNORMAL HIGH (ref 0.00–0.07)
Basophils Absolute: 0 10*3/uL (ref 0.0–0.1)
Basophils Relative: 0 %
Eosinophils Absolute: 0 10*3/uL (ref 0.0–0.5)
Eosinophils Relative: 1 %
HCT: 29.2 % — ABNORMAL LOW (ref 39.0–52.0)
Hemoglobin: 10 g/dL — ABNORMAL LOW (ref 13.0–17.0)
Immature Granulocytes: 2 %
Lymphocytes Relative: 10 %
Lymphs Abs: 0.6 10*3/uL — ABNORMAL LOW (ref 0.7–4.0)
MCH: 31.6 pg (ref 26.0–34.0)
MCHC: 34.2 g/dL (ref 30.0–36.0)
MCV: 92.4 fL (ref 80.0–100.0)
Monocytes Absolute: 0.4 10*3/uL (ref 0.1–1.0)
Monocytes Relative: 6 %
Neutro Abs: 5.1 10*3/uL (ref 1.7–7.7)
Neutrophils Relative %: 81 %
Platelets: 237 10*3/uL (ref 150–400)
RBC: 3.16 MIL/uL — ABNORMAL LOW (ref 4.22–5.81)
RDW: 17.1 % — ABNORMAL HIGH (ref 11.5–15.5)
WBC: 6.2 10*3/uL (ref 4.0–10.5)
nRBC: 0 % (ref 0.0–0.2)

## 2022-12-06 LAB — COMPREHENSIVE METABOLIC PANEL
ALT: 37 U/L (ref 0–44)
AST: 26 U/L (ref 15–41)
Albumin: 3.1 g/dL — ABNORMAL LOW (ref 3.5–5.0)
Alkaline Phosphatase: 741 U/L — ABNORMAL HIGH (ref 38–126)
Anion gap: 8 (ref 5–15)
BUN: 13 mg/dL (ref 8–23)
CO2: 18 mmol/L — ABNORMAL LOW (ref 22–32)
Calcium: 8 mg/dL — ABNORMAL LOW (ref 8.9–10.3)
Chloride: 104 mmol/L (ref 98–111)
Creatinine, Ser: 0.56 mg/dL — ABNORMAL LOW (ref 0.61–1.24)
GFR, Estimated: >60 mL/min (ref >60)
Glucose, Bld: 99 mg/dL (ref 70–99)
Potassium: 4.1 mmol/L (ref 3.5–5.1)
Sodium: 130 mmol/L — ABNORMAL LOW (ref 135–145)
Total Bilirubin: 0.6 mg/dL (ref 0.3–1.2)
Total Protein: 5.9 g/dL — ABNORMAL LOW (ref 6.5–8.1)

## 2022-12-06 LAB — PSA: Prostatic Specific Antigen: 734.92 ng/mL — ABNORMAL HIGH (ref 0.00–4.00)

## 2022-12-06 LAB — MAGNESIUM: Magnesium: 2.2 mg/dL (ref 1.7–2.4)

## 2022-12-06 MED ORDER — SODIUM CHLORIDE 0.9% FLUSH
10.0000 mL | Freq: Once | INTRAVENOUS | Status: AC
  Administered 2022-12-06: 10 mL via INTRAVENOUS

## 2022-12-06 MED ORDER — HEPARIN SOD (PORK) LOCK FLUSH 100 UNIT/ML IV SOLN
500.0000 [IU] | Freq: Once | INTRAVENOUS | Status: AC
  Administered 2022-12-06: 500 [IU] via INTRAVENOUS

## 2022-12-06 MED ORDER — PHENAZOPYRIDINE HCL 200 MG PO TABS: 200.0000 mg | ORAL_TABLET | Freq: Three times a day (TID) | ORAL | 0 refills | Status: DC

## 2022-12-06 NOTE — Progress Notes (Signed)
[facility] [address], [place] [address]    Clinic Day:  12/06/22   Referring physician: Etta Grandchild, MD  Patient Care Team: Etta Grandchild, MD as PCP - General (Internal Medicine) Doreatha Massed, MD as Consulting Physician (Hematology and Oncology) Jena Gauss Gerrit Friends, MD as Consulting Physician (Gastroenterology) Therese Sarah, RN as Oncology Nurse Navigator (Medical Oncology)   ASSESSMENT & PLAN:   Assessment: 1.  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  Prominent left hydronephrosis and hydroureter noted.  New sclerotic lesion in the left anterior vertebral body of L1.  Mild left periaortic adenopathy increased from prior, short axis lymph node measuring 1 cm. -Last PSA increased to 15.8 on 09/28/2019. -As there is rapid progression of disease, I have recommended docetaxel chemotherapy.  He is agreeable after long discussion. -Cystoscopy with transurethral resection of tumor in the posterior bladder neck, right and left walls of the bladder, right lobe of the prostate on 11/10/2019. -Plan was to do 6 cycles of docetaxel followed by abiraterone and prednisone. -6 cycles of docetaxel from 11/16/2019 through 02/29/2020. -CTAP on 01/06/2020 showed persistent thickening of the urinary bladder wall.  Left hydronephrosis is significantly reduced compared to prior exam.  Left-sided retroperitoneal and left iliac lymph nodes reduced in size.  Interval increase in sclerosis of lesions of the left aspect of L2 vertebral body consistent with treatment response.  No new bone lesions. -Bone scan on 01/06/2020 shows stable L2 metastatic focus. -Bone scan scan images from 06/14/2020 which showed progressive metastatic disease, with increased size and intensity of L2 1 new focus of activity at  L1. 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 -Abiraterone and prednisone from 06/24/2020 through 09/06/2021 with progression. - PSMA PET scan (09/28/2021): Residual/recurrent tumor involving prostate gland and seminal vesicles.  Tracer avid bone metastasis localized L1 and L2 vertebral bodies.  No new sites of bone metastasis disease.  No solid organ or nodal mets. - Darolutamide from 10/17/2021 through 03/15/2022 with progression - Guardant360: MSI high not detected.  Androgen resistance present. - XRT to L2-L4, 30 Gray in 10 fractions completed on 04/23/2022 with improvement in pain. - Cabazitaxel 4 cycles from 05/07/2022 through 07/11/2022 with progression. - Pluvicto cycle 1 on 09/26/2022 and cycle 2 on 11/08/22.   2.  Left hydroureteronephrosis: -CT scan showed prostate mass invading the posterior bladder and obstructing the ureter.  Creatinine increased to 1.1. -Renal ultrasound on 12/11/2019 showed interval resolution of previously seen left-sided hydronephrosis.  Previously seen soft tissue mass at the posterior bladder lumen no longer seen.    Plan: 1.  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 - He had mostly gastrointestinal side effects including nausea/vomiting, decreased appetite and fatigue.  He also had hiccups on and off. - He had required multiple IV fluids and nausea medication.  He had port placed on 10/11/2022. - Started cycle 2 of Pluvicto on 11/08/2022 with premedications with dexamethasone 10 mg, Aloxi and Emend with significant improvement of his tolerance. -Developed dysuria and increased urinary frequency on 12/03/2022 and was evaluated in the emergency room.  Imaging revealed enhancing soft tissue indenting the bladder base and enhancing tumor involving the base of seminal vesicle.  Recommend cystoscopy.  Multiple osseous metastasis. -She was started on Pyridium by the emergency room with  improvement of his dysuria.  Needs any prescription until  his visit with urology. -Discussed with patient and wife current findings and recommend urgent appointment with Dr. Annabell Howells for cystoscopy.  Lequita Halt has called Dr. Belva Crome office to see how soon he can get in.  He was scheduled initially for follow-up on 12/20/2022. -She is also scheduled for cycle 3 of Pluvicto on 12/20/2022.  2.  Hot flashes: - Continue estradiol twice daily for hot flashes.   3.  Nausea/vomiting: - He is currently taking Compazine, Zofran and Marinol was recently added with improvement of his symptoms.  Megace was also added for appetite stimulant which has been helping. -With cycle 2 Pluvicto he was started on dexamethasone 4 mg daily on day 2 of this cycle which significantly help with his nausea/vomiting.   4.  Lower back pain/bilateral hip pains: - Continue tramadol 100 mg twice daily as needed.  Use Norco 5/325 every 12 hours as needed for severe pain.    PLAN SUMMARY: >> Urgent referral to Dr. Wilson Singer for cystoscopy of new enhancing bladder mass. >> Continue Pyridium for dysuria.  New prescription sent to [facility]. >> Will keep appointment with Dr. Ellin Saba on 12/19/2022 until we hear back about date of cystoscopy.      No orders of the defined types were placed in this encounter.   I spent 20 minutes dedicated to the care of this patient (face-to-face and non-face-to-face) on the date of the encounter to include what is described in the assessment and plan.   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    Prior Therapy: 1. TURP on 11/10/2019. 2. Docetaxel x 6 cycles, 11/16/2019 - 02/29/2020. 3.  Abiraterone and prednisone, 06/24/20 - 09/06/21 4.  Darolutamide, 10/17/21 - 03/15/22  Current Therapy:  Cabazitaxel every 21 days    HISTORY OF PRESENT ILLNESS:   Oncology History  Prostate  cancer metastatic to multiple sites [facility])  01/25/2017 Initial Diagnosis   Prostate cancer metastatic to multiple sites [facility])   11/16/2019 - 03/02/2020 Chemotherapy   Patient is on Treatment Plan : PROSTATE Docetaxel + Prednisone q21d      Genetic Testing   Negative genetic testing. No pathogenic variants identified on the [facility] CancerNext-Expanded panel. 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 The report date is 04/19/2020.  The CancerNext-Expanded  gene panel offered by [facility] and includes sequencing and rearrangement analysis for the following 77 genes: IP, ALK, APC*, ATM*, AXIN2, BAP1, BARD1, BLM, BMPR1A, BRCA1*, BRCA2*, BRIP1*, CDC73, CDH1*,CDK4, CDKN1B, CDKN2A, CHEK2*, CTNNA1, DICER1, FANCC, FH, FLCN, GALNT12, KIF1B, LZTR1, MAX, MEN1, MET, MLH1*, MSH2*, MSH3, MSH6*, MUTYH*, NBN, NF1*, NF2, NTHL1, PALB2*, PHOX2B, PMS2*, POT1, PRKAR1A, PTCH1, PTEN*, RAD51C*, RAD51D*,RB1, RECQL, RET, SDHA, SDHAF2, SDHB, SDHC, SDHD, SMAD4, SMARCA4, SMARCB1, SMARCE1, STK11, SUFU, TMEM127, TP53*,TSC1, TSC2, VHL and XRCC2 (sequencing and deletion/duplication); EGFR, EGLN1, HOXB13, KIT, MITF, PDGFRA, POLD1 and POLE (sequencing only); EPCAM and GREM1 (deletion/duplication only).    05/07/2022 -  Chemotherapy   Patient is on Treatment Plan : PROSTATE Cabazitaxel (20) D1 + Prednisone D1-21 q21d        INTERVAL HISTORY:   Scott [name] is a 67 y.o. 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   He was evaluated in the ED on 12/04/2022 for complaints of dysuria and increased urinary frequency.  Workup included urinalysis and blood work which was essentially unremarkable.  He had a CT abdomen/pelvis on 12/04/2022 which showed history of prostate neoplasm with marked increase in sclerotic bone metastasis compared to prior CT scan.  Nodular prostate with mass effect along the bladder stable bladder wall thickening and stranding.  MRI showed status post TURP with  enhancing soft tissue indenting the bladder base suggesting tumor and the patient with known prostate adenocarcinoma.  If there is concern for primary bladder carcinoma consider cystoscopy.  Today, he states that he is doing fair.  Appetite is 25% energy levels are 30 to 40%.  He has hip and back discomfort and persistent urinary problems including dysuria and increased urinary frequency although this is improved some since starting Pyridium.  Reports significant improvement of his appetite and nausea following the second cycle of Pluvicto with the addition of Megace, Marinol and dexamethasone.  Reports he has a scheduled visit with Dr. Annabell Howells in early October.   PAST MEDICAL HISTORY:   Past Medical History: Past Medical History:  Diagnosis Date   Bladder obstruction    Complication of anesthesia    Diverticulitis 2015   Dyspnea    Essential hypertension, benign 01/26/2019   at doctor's office elevated, normal at home, no medications at this time   History of basal cell cancer    HLD (hyperlipidemia) 01/26/2019   Hypothyroidism    history of, not currently taking medication   PONV (postoperative nausea and vomiting)    Prostate cancer (HCC)    metastatic to bones   Vitamin D deficiency disease 01/26/2019    Surgical History: Past Surgical History:  Procedure Laterality Date   CYSTOSCOPY W/ URETERAL STENT PLACEMENT Left 11/10/2019   Procedure: CYSTOSCOPY;  Surgeon: Bjorn Pippin, MD;  Location: [facility];  Service: Urology;  Laterality: Left;   ESOPHAGOGASTRODUODENOSCOPY (EGD) WITH PROPOFOL N/A 07/07/2020   Procedure: ESOPHAGOGASTRODUODENOSCOPY (EGD) WITH PROPOFOL;  Surgeon: Corbin Ade, MD;  Location: [facility];  Service: Endoscopy;  Laterality: N/A;  pm appt   HERNIA REPAIR  1995   UMBILICAL    IR FLUORO GUIDED NEEDLE PLC ASPIRATION/INJECTION LOC  04/09/2022   IR IMAGING GUIDED PORT INSERTION  10/11/2022   MALONEY DILATION N/A 07/07/2020   Procedure: Elease Hashimoto DILATION;  Surgeon:  Corbin Ade, MD;  Location: [facility];  Service: Endoscopy;  Laterality: N/A;   PROSTATE SURGERY     SKIN CANCER EXCISION     TONSILLECTOMY Bilateral    TOOTH EXTRACTION     front left tooth pulled but no surgery or stitches   TRANSURETHRAL RESECTION OF PROSTATE N/A 01/25/2017   Procedure: TRANSURETHRAL RESECTION OF THE PROSTATE (TURP);  Surgeon: Sebastian Ache, MD;  Location: [facility];  Service: Urology;  Laterality: N/A;   TRANSURETHRAL RESECTION OF PROSTATE N/A 11/10/2019   Procedure: TRANSURETHRAL RESECTION OF THE PROSTATE (TURP);  Surgeon: Bjorn Pippin, MD;  Location: [facility];  Service: Urology;  Laterality: N/A;   WISDOM TOOTH EXTRACTION      Social History: Social History   Socioeconomic History   Marital status: Married    Spouse name: Not on file   Number of children: 1   Years of education: Not on file   Highest education level: Bachelor's degree (e.g., BA, AB, BS)  Occupational History    Comment: working full time  Tobacco Use   Smoking status: Former    Current packs/day: 0.00    Average packs/day: 1.5 packs/day for 35.0 years (52.5 ttl pk-yrs)  Types: Cigarettes    Start date: 01/11/1969    Quit date: 01/12/2004    Years since quitting: 18.9   Smokeless tobacco: Never   Tobacco comments:    OVER 30 YEARS HX OF SMOKING   Vaping Use   Vaping status: Never Used  Substance and Sexual Activity   Alcohol use: Not Currently    Comment: heavy drinker until 2004   Drug use: Not Currently    Types: Marijuana   Sexual activity: Not Currently  Other Topics Concern   Not on file  Social History Narrative   Married for 19 years.Works for [employer] from home.   Social Determinants of Health   Financial Resource Strain: Low Risk  (08/24/2022)   Overall Financial Resource Strain (CARDIA)    Difficulty of Paying Living Expenses: Not very hard  Food Insecurity: No Food Insecurity (08/24/2022)   Hunger Vital Sign    Worried About Running Out of Food in the  Last Year: Never true    Ran Out of Food in the Last Year: Never true  Transportation Needs: No Transportation Needs (08/24/2022)   PRAPARE - Administrator, Civil Service (Medical): No    Lack of Transportation (Non-Medical): No  Physical Activity: Insufficiently Active (08/24/2022)   Exercise Vital Sign    Days of Exercise per Week: 5 days    Minutes of Exercise per Session: 20 min  Stress: Stress Concern Present (08/24/2022)   [employer] of Occupational Health - Occupational Stress Questionnaire    Feeling of Stress : To some extent  Social Connections: Socially Isolated (08/24/2022)   Social Connection and Isolation Panel [NHANES]    Frequency of Communication with Friends and Family: Once a week    Frequency of Social Gatherings with Friends and Family: Once a week    Attends Religious Services: Never    Database administrator or Organizations: No    Attends Engineer, structural: Not on file    Marital Status: Married  Catering manager Violence: Not At Risk (03/02/2020)   Humiliation, Afraid, Rape, and Kick questionnaire    Fear of Current or Ex-Partner: No    Emotionally Abused: No    Physically Abused: No    Sexually Abused: No    Family History: Family History  Problem Relation Age of Onset   Emphysema Mother    Alzheimer's disease Father    Prostate cancer Father        dx in his 91s   Prostate cancer Brother        dx in his 65s   Breast cancer Maternal Grandmother        60s   Pancreatic cancer Maternal Grandfather        24s   Dementia Paternal Grandmother    Heart attack Paternal Grandfather    Colon cancer Neg Hx     Current Medications:  Current Outpatient Medications:    arginine 500 MG tablet, Take 500 mg by mouth 2 (two) times daily., Disp: , Rfl:    Cholecalciferol 125 MCG (5000 UT) capsule, Take 5,000-10,000 Units by mouth See admin instructions. Take 09323 units in the morning and 5000 units at night, Disp: , Rfl:     DANDELION ROOT PO, Take 500 mg by mouth daily., Disp: , Rfl:    dexamethasone (DECADRON) 4 MG tablet, Take 1 tablet (4 mg total) by mouth daily. Take for three days after each Pluvicto treatment, Disp: 30 tablet, Rfl: 0   dronabinol (MARINOL)  5 MG capsule, TAKE 1 CAPSULE(5 MG) BY MOUTH TWICE DAILY BEFORE A MEAL, Disp: 60 capsule, Rfl: 1   dutasteride (AVODART) 0.5 MG capsule, TAKE 1 CAPSULE(0.5 MG) BY MOUTH DAILY, Disp: 90 capsule, Rfl: 3   Elastic Bandages & Supports (B-4 MED COMPRESSION HOSE MENS) MISC, Wear as needed when awake to help prevent leg swelling, Disp: 2 each, Rfl: 0   estradiol (VIVELLE-DOT) 0.1 MG/24HR patch, APPLY 1 PATCH(0.1 MG) TOPICALLY TO THE SKIN 2 TIMES A WEEK, Disp: 8 patch, Rfl: 12   HYDROcodone-acetaminophen (NORCO) 5-325 MG tablet, Take 1 tablet by mouth every 6 (six) hours as needed for moderate pain., Disp: 120 tablet, Rfl: 0   MAGNESIUM PO, Take 1 tablet by mouth at bedtime., Disp: , Rfl:    megestrol (MEGACE) 40 MG/ML suspension, SHAKE LIQUID AND TAKE 10 ML(400 MG) BY MOUTH TWICE DAILY, Disp: 480 mL, Rfl: 0   MELATONIN PO, Take 40 mg by mouth at bedtime., Disp: , Rfl:    metoprolol tartrate (LOPRESSOR) 25 MG tablet, Take 0.5 tablets (12.5 mg total) by mouth 2 (two) times daily., Disp: 30 tablet, Rfl: 1   MILK THISTLE PO, Take 1 capsule by mouth daily., Disp: , Rfl:    NATTOKINASE PO, Take 2 capsules by mouth daily., Disp: , Rfl:    NP THYROID 120 MG tablet, Take 0.5 tablets (60 mg total) by mouth every morning., Disp: 45 tablet, Rfl: 1   olmesartan (BENICAR) 20 MG tablet, TAKE 1 TABLET(20 MG) BY MOUTH DAILY, Disp: 90 tablet, Rfl: 1   ondansetron (ZOFRAN ODT) 8 MG disintegrating tablet, Take 1 tablet (8 mg total) by mouth every 8 (eight) hours as needed for nausea or vomiting., Disp: 60 tablet, Rfl: 3   phenazopyridine (PYRIDIUM) 200 MG tablet, Take 1 tablet (200 mg total) by mouth 3 (three) times daily., Disp: 6 tablet, Rfl: 0   prochlorperazine (COMPAZINE) 10 MG  tablet, Take 10 mg by mouth every 6 (six) hours., Disp: , Rfl:    prochlorperazine (COMPAZINE) 25 MG suppository, Place 1 suppository (25 mg total) rectally every 8 (eight) hours as needed for nausea or vomiting., Disp: 24 suppository, Rfl: 3   pyridOXINE (VITAMIN B6) 25 MG tablet, Take 25 mg by mouth daily., Disp: , Rfl:    QUERCETIN PO, Take 1 tablet by mouth 2 (two) times daily., Disp: , Rfl:    thiamine (VITAMIN B-1) 50 MG tablet, Take 1 tablet (50 mg total) by mouth daily., Disp: 90 tablet, Rfl: 1   traMADol (ULTRAM) 50 MG tablet, Take 2 tablets (100 mg total) by mouth every 6 (six) hours as needed., Disp: 120 tablet, Rfl: 0   VITAMIN K PO, Take 1 capsule by mouth every other day., Disp: , Rfl:    Zinc 30 MG CAPS, Take 30 mg by mouth daily., Disp: , Rfl:    Allergies: Allergies  Allergen Reactions   Crestor [Rosuvastatin] Other (See Comments)    myalgia   Ivp Dye [Iodinated Contrast Media] Swelling    Swelling on head, took Benadryl and swelling reduced   Wellbutrin [Bupropion] Hives    REVIEW OF SYSTEMS:   Review of Systems  Constitutional:  Positive for fatigue.  Respiratory:  Positive for shortness of breath (Occasional).   Gastrointestinal:  Positive for nausea and vomiting. Negative for constipation and diarrhea.  Genitourinary:  Positive for dysuria and frequency. Negative for bladder incontinence.   Hematological:  Negative for adenopathy.     VITALS:   There were no vitals taken for this  visit.  Wt Readings from Last 3 Encounters:  12/04/22 149 lb (67.6 kg)  11/06/22 144 lb 6.4 oz (65.5 kg)  10/22/22 139 lb (63 kg)    There is no height or weight on file to calculate BMI.  Performance status (ECOG): 1 - Symptomatic but completely ambulatory  PHYSICAL EXAM:   Physical Exam Vitals and nursing note reviewed. Exam conducted with a chaperone present.  Constitutional:      Appearance: Normal appearance.  Cardiovascular:     Rate and Rhythm: Normal rate and  regular rhythm.     Pulses: Normal pulses.     Heart sounds: Normal heart sounds.  Pulmonary:     Effort: Pulmonary effort is normal.     Breath sounds: Normal breath sounds.  Abdominal:     Palpations: Abdomen is soft. There is no hepatomegaly, splenomegaly or mass.     Tenderness: There is no abdominal tenderness.  Musculoskeletal:     Right lower leg: No edema.     Left lower leg: No edema.  Lymphadenopathy:     Cervical: No cervical adenopathy.     Right cervical: No superficial, deep or posterior cervical adenopathy.    Left cervical: No superficial, deep or posterior cervical adenopathy.     Upper Body:     Right upper body: No supraclavicular or axillary adenopathy.     Left upper body: No supraclavicular or axillary adenopathy.  Neurological:     General: No focal deficit present.     Mental Status: He is alert and oriented to person, place, and time.  Psychiatric:        Mood and Affect: Mood normal.        Behavior: Behavior normal.     LABS:      Latest Ref Rng & Units 12/04/2022    3:32 PM 10/30/2022    1:39 PM 10/22/2022   12:42 PM  CBC  WBC 4.0 - 10.5 K/uL 5.7  5.5  6.6   Hemoglobin 13.0 - 17.0 g/dL 09.8  11.9  14.7   Hematocrit 39.0 - 52.0 % 33.9  30.2  35.6   Platelets 150 - 400 K/uL 289  221  235       Latest Ref Rng & Units 12/04/2022    3:32 PM 10/30/2022    1:39 PM 10/22/2022   12:42 PM  CMP  Glucose 70 - 99 mg/dL 99  829  562   BUN 8 - 23 mg/dL 11  9  11    Creatinine 0.61 - 1.24 mg/dL 1.30  8.65  7.84   Sodium 135 - 145 mmol/L 130  126  124   Potassium 3.5 - 5.1 mmol/L 3.9  3.5  3.0   Chloride 98 - 111 mmol/L 99  97  93   CO2 22 - 32 mmol/L 22  19  21    Calcium 8.9 - 10.3 mg/dL 8.2  8.6  8.5   Total Protein 6.5 - 8.1 g/dL 6.6  6.0  6.8   Total Bilirubin 0.3 - 1.2 mg/dL 0.8  0.5  1.6   Alkaline Phos 38 - 126 U/L 913  890  1,283   AST 15 - 41 U/L 26  27  25    ALT 0 - 44 U/L 47  17  13      No results found for: "CEA1", "CEA" / No results found  for: "CEA1", "CEA" Lab Results  Component Value Date   PSA1 12.9 (H) 05/26/2020   No results found for: "  ZOX096" No results found for: "CAN125"  No results found for: "TOTALPROTELP", "ALBUMINELP", "A1GS", "A2GS", "BETS", "BETA2SER", "GAMS", "MSPIKE", "SPEI" Lab Results  Component Value Date   TIBC 336.0 01/09/2021   TIBC 280 04/20/2020   FERRITIN 69.8 01/09/2021   FERRITIN 45 04/20/2020   IRONPCTSAT 45.2 01/09/2021   IRONPCTSAT 31 04/20/2020   No results found for: "LDH"   STUDIES:   MR PELVIS W WO CONTRAST  Result Date: 12/04/2022 CLINICAL DATA:  Bladder mass, status post TURP, history of prostate cancer EXAM: MRI PELVIS WITHOUT AND WITH CONTRAST TECHNIQUE: Multiplanar multisequence MR imaging of the pelvis was performed both before and after administration of intravenous contrast. CONTRAST:  7mL GADAVIST GADOBUTROL 1 MMOL/ML IV SOLN COMPARISON:  CT abdomen/pelvis dated 12/04/2022 FINDINGS: Urinary Tract: Bladder is mildly thick-walled although underdistended. Soft tissue along the bladder base, described below. Bowel:  Sigmoid diverticulosis, without evidence of diverticulitis. Vascular/Lymphatic: No evidence of abdominal aortic aneurysm. No suspicious abdominal lymphadenopathy. 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 Additional enhancing tumor involving the base of the seminal vesicles (series 8/image 51). Other:  Trace presacral fluid. Musculoskeletal: Multifocal osseous metastases. 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 If there is concern for primary bladder carcinoma, consider cystoscopy. Additional enhancing tumor involving the base of the seminal vesicles. Multifocal osseous metastases. Electronically Signed   By: Charline Bills M.D.    On: 12/04/2022 20:14   CT ABDOMEN PELVIS WO CONTRAST  Result Date: 12/04/2022 CLINICAL DATA:  Bladder dysfunction. History of metastatic prostate cancer. Concern for bladder mass. * Tracking Code: BO * EXAM: CT ABDOMEN AND PELVIS WITHOUT CONTRAST TECHNIQUE: Multidetector CT imaging of the abdomen and pelvis was performed following the standard protocol without IV contrast. RADIATION DOSE REDUCTION: This exam was performed according to the departmental dose-optimization program which includes automated exposure control, adjustment of the mA and/or kV according to patient size and/or use of iterative reconstruction technique. COMPARISON:  CT 07/26/2022 FINDINGS: Lower chest: 4 mm subpleural left lower lobe lung nodule on image 40 of series 4. This measured 3 mm previously, similar. There is some bilateral lung base areas of atelectasis. Breathing motion. Stable 2-3 mm nodule subpleural right lower lobe series 4, image 17 as well. No pleural effusion. Gynecomastia. Hepatobiliary: Stones in the nondilated gallbladder. On this non IV contrast exam, no clear space-occupying liver lesion. Evaluation for solid organ pathology is limited without the advantage of IV contrast. Pancreas: Global mild atrophy of the pancreas particularly the head neck and body region. Unchanged from previous. Spleen: Normal in size without focal abnormality. Adrenals/Urinary Tract: Punctate calcification along the right adrenal gland. Stable. Normal left adrenal. No abnormal calcification seen within either kidney nor along the course of either ureter. Slight ectasia of the distal ureters, nonspecific. Preserved contours of the urinary bladder with some wall thickening and stranding. Stomach/Bowel: There is fluid and debris along the stomach. Large bowel has a normal course and caliber with moderate colonic stool. Few distal colonic diverticula. The appendix is grossly preserved in the right lower quadrant and is nondilated. Small bowel is  nondilated. Vascular/Lymphatic: Aortic atherosclerosis. No enlarged abdominal or pelvic lymph nodes. Reproductive: Lobular prostate with TURP defect and calcifications. Previously there was a enhancing nodule along the margin of the TURP defect inferiorly. This has less well defined without the advantage of IV  contrast but the contour deformity is still present on series 2, image 74. Additional evaluation as clinically appropriate. MRI may be of some benefit as an option Other: Nonspecific presacral fat stranding, increased from previous. Small bilateral fat containing inguinal hernias. Epigastric midline anterior abdominal wall moderate fat containing hernia. The fat in the hernia sac is with some stranding. This was seen previously. Musculoskeletal: Extensive sclerotic bone metastases again identified. Comparing current to prior the extent distribution of the areas is markedly increased throughout the pelvis, proximal femurs, spine and ribs. There is also sternal lesions. 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 Nodular prostate with mass effect along the bladder. Stable bladder wall thickening and stranding. The nodular area in the area of the TURP defect of the prostate on the prior is suggested again today but less well seen without the advantage of IV contrast. Additional workup is recommended as clinically appropriate such as MRI. Increasing presacral fat stranding. Gallstones. Gynecomastia. Tiny lung nodules. Additional follow up as per the patient's neoplasm. Electronically Signed   By: Karen Kays M.D.   On: 12/04/2022 17:52   CT HEAD WO CONTRAST ( )  Result Date: 12/04/2022 CLINICAL DATA:  Trauma, pain EXAM: CT HEAD WITHOUT CONTRAST CT MAXILLOFACIAL WITHOUT CONTRAST CT CERVICAL SPINE WITHOUT CONTRAST TECHNIQUE: Multidetector CT imaging of the head, cervical spine, and maxillofacial structures were performed  using the standard protocol without intravenous contrast. Multiplanar CT image reconstructions of the cervical spine and maxillofacial structures were also generated. RADIATION DOSE REDUCTION: This exam was performed according to the departmental dose-optimization program which includes automated exposure control, adjustment of the mA and/or kV according to patient size and/or use of iterative reconstruction technique. COMPARISON:  None Available. FINDINGS: CT HEAD FINDINGS Brain: There is no acute intracranial hemorrhage, extra-axial fluid collection, or acute territorial infarct. Parenchymal volume is normal. The ventricles are normal in size. There is an age-indeterminate small infarct in the right basal ganglia/corona radiata. The pituitary and suprasellar region are normal. There is no mass lesion. There is no mass effect or midline shift. Vascular: No hyperdense vessel or unexpected calcification. Skull: There is osseous metastatic disease in the clivus and bilateral sphenoid bones. Other: The mastoid air cells and middle ear cavities are clear. CT MAXILLOFACIAL FINDINGS Osseous: There is no acute facial bone fracture. There is no evidence of mandibular dislocation. There is abnormal sclerosis in the bilateral sphenoid bones consistent with osseous metastatic disease. No lytic destructive lesion is seen in the facial bones. Orbits: The globes and orbits are unremarkable. Sinuses: There is a small retention cyst in the left maxillary sinus. Soft tissues: Unremarkable. CT CERVICAL SPINE FINDINGS Alignment: There is mild anterolisthesis of C3 on C4 and retrolisthesis of C4 on C5, likely degenerative. There is no evidence of traumatic malalignment. Skull base and vertebrae: Skull base alignment is maintained. Vertebral body heights are preserved. There is no evidence of acute fracture. There is extensive sclerotic osseous metastatic disease. Soft tissues and spinal canal: No prevertebral fluid or swelling. No  visible canal hematoma. Disc levels: There is moderate disc space narrowing and degenerative endplate change at C4-C5 through C6-C7 and moderate left facet arthropathy at C3-C4 there is no evidence of high-grade spinal canal stenosis. Upper chest: The imaged lung apices are clear. A right chest wall port is partially imaged. Other: None. IMPRESSION: 1. No acute intracranial hemorrhage or calvarial fracture. 2. Age-indeterminate small infarct in the right basal  ganglia/corona radiata. 3. No acute facial bone fracture. 4. No acute fracture or traumatic malalignment of the cervical spine. 5. Extensive sclerotic osseous metastatic disease. Electronically Signed   By: Lesia Hausen M.D.   On: 12/04/2022 17:12   CT CERVICAL SPINE WO CONTRAST  Result Date: 12/04/2022 CLINICAL DATA:  Trauma, pain EXAM: CT HEAD WITHOUT CONTRAST CT MAXILLOFACIAL WITHOUT CONTRAST CT CERVICAL SPINE WITHOUT CONTRAST TECHNIQUE: Multidetector CT imaging of the head, cervical spine, and maxillofacial structures were performed using the standard protocol without intravenous contrast. Multiplanar CT image reconstructions of the cervical spine and maxillofacial structures were also generated. RADIATION DOSE REDUCTION: This exam was performed according to the departmental dose-optimization program which includes automated exposure control, adjustment of the mA and/or kV according to patient size and/or use of iterative reconstruction technique. COMPARISON:  None Available. FINDINGS: CT HEAD FINDINGS Brain: There is no acute intracranial hemorrhage, extra-axial fluid collection, or acute territorial infarct. Parenchymal volume is normal. The ventricles are normal in size. There is an age-indeterminate small infarct in the right basal ganglia/corona radiata. The pituitary and suprasellar region are normal. There is no mass lesion. There is no mass effect or midline shift. Vascular: No hyperdense vessel or unexpected calcification. Skull: There is  osseous metastatic disease in the clivus and bilateral sphenoid bones. Other: The mastoid air cells and middle ear cavities are clear. CT MAXILLOFACIAL FINDINGS Osseous: There is no acute facial bone fracture. There is no evidence of mandibular dislocation. There is abnormal sclerosis in the bilateral sphenoid bones consistent with osseous metastatic disease. No lytic destructive lesion is seen in the facial bones. Orbits: The globes and orbits are unremarkable. Sinuses: There is a small retention cyst in the left maxillary sinus. Soft tissues: Unremarkable. CT CERVICAL SPINE FINDINGS Alignment: There is mild anterolisthesis of C3 on C4 and retrolisthesis of C4 on C5, likely degenerative. There is no evidence of traumatic malalignment. Skull base and vertebrae: Skull base alignment is maintained. Vertebral body heights are preserved. There is no evidence of acute fracture. There is extensive sclerotic osseous metastatic disease. Soft tissues and spinal canal: No prevertebral fluid or swelling. No visible canal hematoma. Disc levels: There is moderate disc space narrowing and degenerative endplate change at C4-C5 through C6-C7 and moderate left facet arthropathy at C3-C4 there is no evidence of high-grade spinal canal stenosis. Upper chest: The imaged lung apices are clear. A right chest wall port is partially imaged. Other: None. IMPRESSION: 1. No acute intracranial hemorrhage or calvarial fracture. 2. 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 3. No acute facial bone fracture. 4. No acute fracture or traumatic malalignment of the cervical spine. 5. Extensive sclerotic osseous metastatic disease. Electronically Signed   By: Lesia Hausen M.D.   On: 12/04/2022 17:12   CT Maxillofacial Wo Contrast  Result Date: 12/04/2022 CLINICAL DATA:  Trauma, pain EXAM: CT HEAD WITHOUT CONTRAST CT MAXILLOFACIAL WITHOUT CONTRAST CT CERVICAL SPINE WITHOUT CONTRAST TECHNIQUE: Multidetector CT imaging  of the head, cervical spine, and maxillofacial structures were performed using the standard protocol without intravenous contrast. Multiplanar CT image reconstructions of the cervical spine and maxillofacial structures were also generated. RADIATION DOSE REDUCTION: This exam was performed according to the departmental dose-optimization program which includes automated exposure control, adjustment of the mA and/or kV according to patient size and/or use of iterative reconstruction technique. COMPARISON:  None Available. FINDINGS: CT HEAD FINDINGS Brain: There is no acute intracranial hemorrhage, extra-axial fluid collection, or acute territorial infarct. Parenchymal volume is normal. The  ventricles are normal in size. There is an age-indeterminate small infarct in the right basal ganglia/corona radiata. The pituitary and suprasellar region are normal. There is no mass lesion. There is no mass effect or midline shift. Vascular: No hyperdense vessel or unexpected calcification. Skull: There is osseous metastatic disease in the clivus and bilateral sphenoid bones. Other: The mastoid air cells and middle ear cavities are clear. CT MAXILLOFACIAL FINDINGS Osseous: There is no acute facial bone fracture. There is no evidence of mandibular dislocation. There is abnormal sclerosis in the bilateral sphenoid bones consistent with osseous metastatic disease. No lytic destructive lesion is seen in the facial bones. Orbits: The globes and orbits are unremarkable. Sinuses: There is a small retention cyst in the left maxillary sinus. Soft tissues: Unremarkable. CT CERVICAL SPINE FINDINGS Alignment: There is mild anterolisthesis of C3 on C4 and retrolisthesis of C4 on C5, likely degenerative. There is no evidence of traumatic malalignment. Skull base and vertebrae: Skull base alignment is maintained. Vertebral body heights are preserved. There is no evidence of acute fracture. There is extensive sclerotic osseous metastatic disease.  Soft tissues and spinal canal: No prevertebral fluid or swelling. No visible canal hematoma. Disc levels: There is moderate disc space narrowing and degenerative endplate change at C4-C5 through C6-C7 and moderate left facet arthropathy at C3-C4 there is no evidence of high-grade spinal canal stenosis. Upper chest: The imaged lung apices are clear. A right chest wall port is partially imaged. Other: None. IMPRESSION: 1. No acute intracranial hemorrhage or calvarial fracture. 2. 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 3. No acute facial bone fracture. 4. No acute fracture or traumatic malalignment of the cervical spine. 5. Extensive sclerotic osseous metastatic disease. Electronically Signed   By: Lesia Hausen M.D.   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 EXAM: NUCLEAR MEDICINE PLUVICTO INJECTION TECHNIQUE: Infusion: The nuclear medicine technologist and I personally verified the dose activity to be delivered as specified in the written directive, and verified the patient identification via 2 separate methods. Initial flush of the intravenous catheter was performed was sterile saline. The dose syringe was connected to the catheter and the Lu-177 Pluvicto administered over a 1 to 10 min infusion. Single 10 cc lushes with normal saline follow the dose. No complications were noted. The entire IV tubing, venocatheter, stopcock and syringes was removed in total, placed in a disposal bag and sent for assay of the residual activity, which will be reported at a later time in our EMR by the physics staff. Pressure was applied to the venipuncture site, and a compression bandage placed. Patient monitored for 1 hour following infusion. Radiation Safety personnel were present to perform the discharge survey, as detailed on their documentation. After a short period of  observation, the patient had his IV removed. RADIOPHARMACEUTICALS:  218.5 microcuries Lu-177 PLUVICTO FINDINGS: Current Infusion: 2 Planned Infusions: 6 Patient presented to nuclear medicine for treatment. The patient's most recent blood counts were reviewed and remains a good candidate to proceed with Lu-177 Pluvicto. Patient experienced severe nausea and vomiting following initial treatment. Patient ultimately to receive IV fluids from vomiting associated dehydration. With this in mind, patient was pre-medicated with with antinausea medication including Emend 150 mg, Aloxi 0.25 mg, and Decadron 10 mg. Patient's PSA is significantly elevated from comparison exam (potential flare response). No renal toxicity.  Hemoglobin stable. The patient was situated in an infusion  suite with a contact barrier placed under the arm. Intravenous access was established, using sterile technique, and a normal saline infusion from a syringe was started. Micro-dosimetry: The prescribed radiation activity was assayed and confirmed to be within specified tolerance. IMPRESSION: Current Infusion: 2 Planned Infusions: 6 The patient tolerated the infusion well. The patient will return in 6 weeks for ongoing care. Electronically Signed   By: Genevive Bi M.D.   On: 11/08/2022 15:42

## 2022-12-06 NOTE — Progress Notes (Signed)
Patients port flushed without difficulty.  Good blood return noted with no bruising or swelling noted at site.  Stable during access and blood draw.  Band aid applied.  VSS with discharge and left in satisfactory condition with no s/s of distress noted.   ?

## 2022-12-06 NOTE — Telephone Encounter (Signed)
Scott [name] from [facility] called with an update on patient's recent imaging that showed cancer had spread to bladder.  Patient is scheduled for an OV with Dr. Annabell Howells on 10/03, she wanted to know if patient could be scheduled for Cysto bladder biopsy prior to 10/03 appt.  Staff message sent to Dr. Annabell Howells with information, I will update Scott [name] with MD response.

## 2022-12-06 NOTE — Patient Instructions (Signed)
[facility]  Discharge Instructions: Thank you for choosing [facility] to provide your oncology and hematology care.  If you have a lab appointment with the Cancer Center - please note that after April 8th, 2024, all labs will be drawn in the cancer center.  You do not have to check in or register with the main entrance as you have in the past but will complete your check-in in the cancer center.  Wear comfortable clothing and clothing appropriate for easy access to any Portacath or PICC line.   We strive to give you quality time with your provider. You may need to reschedule your appointment if you arrive late (15 or more minutes).  Arriving late affects you and other patients whose appointments are after yours.  Also, if you miss three or more appointments without notifying the office, you may be dismissed from the clinic at the provider's discretion.      For prescription refill requests, have your pharmacy contact our office and allow 72 hours for refills to be completed.    Today you received the following chemotherapy and/or immunotherapy agents Port flush labs      To help prevent nausea and vomiting after your treatment, we encourage you to take your nausea medication as directed.  BELOW ARE SYMPTOMS THAT SHOULD BE REPORTED IMMEDIATELY: *FEVER GREATER THAN 100.4 F (38 C) OR HIGHER *CHILLS OR SWEATING *NAUSEA AND VOMITING THAT IS NOT CONTROLLED WITH YOUR NAUSEA MEDICATION *UNUSUAL SHORTNESS OF BREATH *UNUSUAL BRUISING OR BLEEDING *URINARY PROBLEMS (pain or burning when urinating, or frequent urination) *BOWEL PROBLEMS (unusual diarrhea, constipation, pain near the anus) TENDERNESS IN MOUTH AND THROAT WITH OR WITHOUT PRESENCE OF ULCERS (sore throat, sores in mouth, or a toothache) UNUSUAL RASH, SWELLING OR PAIN  UNUSUAL VAGINAL DISCHARGE OR ITCHING   Items with * indicate a potential emergency and should be followed up as soon as possible or go to  the Emergency Department if any problems should occur.  Please show the CHEMOTHERAPY ALERT CARD or IMMUNOTHERAPY ALERT CARD at check-in to the Emergency Department and triage nurse.  Should you have questions after your visit or need to cancel or reschedule your appointment, please contact [facility] [phone]  and follow the prompts.  Office hours are 8:00 a.m. to 4:30 p.m. Monday - Friday. Please note that voicemails left after 4:00 p.m. may not be returned until the following business day.  We are closed weekends and major holidays. You have access to a nurse at all times for urgent questions. Please call the main number to the clinic [phone] and follow the prompts.  For any non-urgent questions, you may also contact your provider using MyChart. We now offer e-Visits for anyone 6 and older to request care online for non-urgent symptoms. For details visit [email or web address].   Also download the MyChart app! Go to the app store, search "MyChart", open the app, select [place], and log in with your MyChart username and password.

## 2022-12-10 ENCOUNTER — Telehealth: Payer: Self-pay | Admitting: Dietician

## 2022-12-10 ENCOUNTER — Other Ambulatory Visit: Payer: Self-pay | Admitting: Urology

## 2022-12-10 ENCOUNTER — Inpatient Hospital Stay: Payer: BC Managed Care – PPO | Admitting: Dietician

## 2022-12-10 ENCOUNTER — Inpatient Hospital Stay: Payer: BC Managed Care – PPO

## 2022-12-10 NOTE — Progress Notes (Signed)
COVID Vaccine received:  []  No [x]  Yes Date of any COVID positive Test in last 90 days: no PCP - Sanda Linger MD Cardiologist -   Chest x-ray -10/22/22 Epic  EKG -  10/23/22 Epic Stress Test -  ECHO -  Cardiac Cath -   Bowel Prep - [x]  No  []   Yes ______  Pacemaker / ICD device [x]  No []  Yes   Spinal Cord Stimulator:[x]  No []  Yes       History of Sleep Apnea? [x]  No []  Yes   CPAP used?- [x]  No []  Yes    Does the patient monitor blood sugar?          [x]  No []  Yes  []  N/A  Patient has:  NO Hx DM   []  Pre-DM                 []  DM1  []   DM2 Does patient have a [employer] or Dexacom? []  No []  Yes   Fasting Blood Sugar Ranges-  Checks Blood Sugar _____ times a day  GLP1 agonist / usual dose - no GLP1 instructions:  SGLT-2 inhibitors / usual dose - no SGLT-2 instructions:   Blood Thinner / Instructions:no Aspirin Instructions:no  Comments:   Activity level: Patient is able to climb a flight of stairs without difficulty; [x]  No CP  [x]  No SOB, but would have ___   Patient can perform ADLs without assistance.   Anesthesia review:   Patient denies shortness of breath, fever, cough and chest pain at PAT appointment.  Patient verbalized understanding and agreement to the Pre-Surgical Instructions that were given to them at this PAT appointment. Patient was also educated of the need to review these PAT instructions again prior to his/her surgery.I reviewed the appropriate phone numbers to call if they have any and questions or concerns.

## 2022-12-10 NOTE — Telephone Encounter (Signed)
Nutrition Follow-up:  Patient with metastatic prostate cancer. He is currently receiving Pluvicto q21d.   9/17 - ED for dysuria noted   Spoke with pt via telephone for nutrition follow-up. Pt reports appetite has significantly improved. States he "actually looks forward to eating now." Patient recalls 3 slices veggie pizza, few fig newtons, coffee, and water for breakfast. Patient reports megace + marinol are working well for him. Pt denies nausea, vomiting, diarrhea, constipation. Pt reports improved activity. He is walking his dog a few times/week.    Medications: megace, marinol, decadron, norco  Labs: 9/19 - Na 130, Cr 0.56, albumin 3.1  Anthropometrics: Last wt 150 lb 12.8 oz on 9/19 - increased   8/20 - 144 lb 6.4 oz 8/5 - 139 lb 7/3 - 161 lb 11.2 oz  6/5 - 171 lb 3.2 oz   NUTRITION DIAGNOSIS: Predicted suboptimal intake - appears stable   INTERVENTION:  Encouraged high calorie high protein foods to promote stable wt Suggested high protein snacks in between meals Continue activity as able Continue appetite stimulant/antiemetics as prescribed per MD     MONITORING, EVALUATION, GOAL: wt trends, intake   NEXT VISIT: To be scheduled as needed. Pt has contact information and encouraged to contact with nutrition questions/concerns

## 2022-12-10 NOTE — Patient Instructions (Signed)
SURGICAL WAITING ROOM VISITATION  Patients having surgery or a procedure may have no more than 2 support people in the waiting area - these visitors may rotate.    Children under the age of 29 must have an adult with them who is not the patient.  Due to an increase in RSV and influenza rates and associated hospitalizations, children ages 44 and under may not visit patients in [facility] hospitals.  If the patient needs to stay at the hospital during part of their recovery, the visitor guidelines for inpatient rooms apply. Pre-op nurse will coordinate an appropriate time for 1 support person to accompany patient in pre-op.  This support person may not rotate.    Please refer to the [facility] website for the visitor guidelines for Inpatients (after your surgery is over and you are in a regular room).       Your procedure is scheduled on: 12/18/22   Report to [facility] Main Entrance    Report to admitting at 5:15 AM   Call this number if you have problems the morning of surgery [phone]   Do not eat food :After Midnight.   After Midnight you may have the following liquids until  DAY OF SURGERY  Water Non-Citrus Juices (without pulp, NO RED-Apple, White grape, White cranberry) Black Coffee (NO MILK/CREAM OR CREAMERS, sugar ok)  Clear Tea (NO MILK/CREAM OR CREAMERS, sugar ok) regular and decaf                             Plain Jell-O (NO RED)                                           Fruit ices (not with fruit pulp, NO RED)                                     Popsicles (NO RED)                                                               Sports drinks like Gatorade (NO RED)                  The day of surgery:  Drink ONE (1) Pre-Surgery Clear EnsureAM the morning of surgery. Drink in one sitting. Do not sip.  This drink was given to you during your hospital  pre-op appointment visit. Nothing else to drink after completing the  Pre-Surgery Clear Ensure       Oral Hygiene is also important to reduce your risk of infection.                                    Remember - BRUSH YOUR TEETH THE MORNING OF SURGERY WITH YOUR REGULAR TOOTHPASTE  DENTURES WILL BE REMOVED PRIOR TO SURGERY PLEASE DO NOT APPLY "Poly grip" OR ADHESIVES!!!   Stop all vitamins and herbal supplements 7 days before surgery.   Take these medicines the morning of surgery:  Avodart, Megace, Metoprolol, NP thyroid, Pyridium, Compazine             You may not have any metal on your body including hair pins, jewelry, and body piercing             Do not wear lotions, powders, cologne, or deodorant              Men may shave face and neck.   Do not bring valuables to the hospital. [facility] IS NOT             RESPONSIBLE   FOR VALUABLES.   Contacts, glasses, dentures or bridgework may not be worn into surgery.   Bring small overnight bag day of surgery.   DO NOT BRING YOUR HOME MEDICATIONS TO THE HOSPITAL. PHARMACY WILL DISPENSE MEDICATIONS LISTED ON YOUR MEDICATION LIST TO YOU DURING YOUR ADMISSION IN THE HOSPITAL!    Patients discharged on the day of surgery will not be allowed to drive home.  Someone NEEDS to stay with you for the first 24 hours after anesthesia.   Special Instructions: Bring a copy of your healthcare power of attorney and living will documents the day of surgery if you haven't scanned them before.              Please read over the following fact sheets you were given: IF YOU HAVE QUESTIONS ABOUT YOUR PRE-OP INSTRUCTIONS PLEASE CALL [phone] [place]   If you received a COVID test during your pre-op visit  it is requested that you wear a mask when out in public, stay away from anyone that may not be feeling well and notify your surgeon if you develop symptoms. If you test positive for Covid or have been in contact with anyone that has tested positive in the last 10 days please notify you surgeon.     - Preparing for Surgery Before surgery,  you can play an important role.  Because skin is not sterile, your skin needs to be as free of germs as possible.  You can reduce the number of germs on your skin by washing with CHG (chlorahexidine gluconate) soap before surgery.  CHG is an antiseptic cleaner which kills germs and bonds with the skin to continue killing germs even after washing. Please DO NOT use if you have an allergy to CHG or antibacterial soaps.  If your skin becomes reddened/irritated stop using the CHG and inform your nurse when you arrive at Short Stay. Do not shave (including legs and underarms) for at least 48 hours prior to the first CHG shower.  You may shave your face/neck.  Please follow these instructions carefully:  1.  Shower with CHG Soap the night before surgery and the  morning of surgery.  2.  If you choose to wash your hair, wash your hair first as usual with your normal  shampoo.  3.  After you shampoo, rinse your hair and body thoroughly to remove the shampoo.                             4.  Use CHG as you would any other liquid soap.  You can apply chg directly to the skin and wash.  Gently with a scrungie or clean washcloth.  5.  Apply the CHG Soap to your body ONLY FROM THE NECK DOWN.   Do   not use on face/ open  Wound or open sores. Avoid contact with eyes, ears mouth and   genitals (private parts).                       Wash face,  Genitals (private parts) with your normal soap.             6.  Wash thoroughly, paying special attention to the area where your    surgery  will be performed.  7.  Thoroughly rinse your body with warm water from the neck down.  8.  DO NOT shower/wash with your normal soap after using and rinsing off the CHG Soap.                9.  Pat yourself dry with a clean towel.            10.  Wear clean pajamas.            11.  Place clean sheets on your bed the night of your first shower and do not  sleep with pets. Day of Surgery : Do not apply any  lotions/deodorants the morning of surgery.  Please wear clean clothes to the hospital/surgery center.  FAILURE TO FOLLOW THESE INSTRUCTIONS MAY RESULT IN THE CANCELLATION OF YOUR SURGERY  PATIENT SIGNATURE_________________________________  NURSE SIGNATURE__________________________________  ________________________________________________________________________

## 2022-12-10 NOTE — Progress Notes (Signed)
Please send pre op orders for PST visit 12/11/22

## 2022-12-11 ENCOUNTER — Encounter (HOSPITAL_COMMUNITY): Payer: Self-pay

## 2022-12-11 ENCOUNTER — Telehealth: Payer: Self-pay | Admitting: Hematology

## 2022-12-11 ENCOUNTER — Other Ambulatory Visit: Payer: Self-pay

## 2022-12-11 ENCOUNTER — Encounter (HOSPITAL_COMMUNITY)
Admission: RE | Admit: 2022-12-11 | Discharge: 2022-12-11 | Disposition: A | Discharge status: Home / Self Care | Payer: BC Managed Care – PPO | Source: Ambulatory Visit | Attending: Urology | Admitting: Urology

## 2022-12-11 VITALS — BP 139/76 | HR 72 | Temp 98.3°F | Resp 16 | Ht 66.0 in | Wt 147.0 lb

## 2022-12-11 DIAGNOSIS — D649 Anemia, unspecified: Secondary | ICD-10-CM | POA: Insufficient documentation

## 2022-12-11 DIAGNOSIS — Z01812 Encounter for preprocedural laboratory examination: Secondary | ICD-10-CM | POA: Insufficient documentation

## 2022-12-11 DIAGNOSIS — Z01818 Encounter for other preprocedural examination: Secondary | ICD-10-CM | POA: Diagnosis present

## 2022-12-11 DIAGNOSIS — I10 Essential (primary) hypertension: Secondary | ICD-10-CM | POA: Insufficient documentation

## 2022-12-11 HISTORY — DX: Anemia, unspecified: D64.9

## 2022-12-11 HISTORY — DX: Gastro-esophageal reflux disease without esophagitis: K21.9

## 2022-12-11 LAB — TYPE AND SCREEN
ABO/RH(D): AB POS
Antibody Screen: NEGATIVE

## 2022-12-11 LAB — BASIC METABOLIC PANEL
Anion gap: 7 (ref 5–15)
BUN: 16 mg/dL (ref 8–23)
CO2: 21 mmol/L — ABNORMAL LOW (ref 22–32)
Calcium: 8.5 mg/dL — ABNORMAL LOW (ref 8.9–10.3)
Chloride: 104 mmol/L (ref 98–111)
Creatinine, Ser: 0.64 mg/dL (ref 0.61–1.24)
GFR, Estimated: >60 mL/min (ref >60)
Glucose, Bld: 91 mg/dL (ref 70–99)
Potassium: 4.3 mmol/L (ref 3.5–5.1)
Sodium: 132 mmol/L — ABNORMAL LOW (ref 135–145)

## 2022-12-11 LAB — CBC
HCT: 31.4 % — ABNORMAL LOW (ref 39.0–52.0)
Hemoglobin: 10.4 g/dL — ABNORMAL LOW (ref 13.0–17.0)
MCH: 32.1 pg (ref 26.0–34.0)
MCHC: 33.1 g/dL (ref 30.0–36.0)
MCV: 96.9 fL (ref 80.0–100.0)
Platelets: 258 10*3/uL (ref 150–400)
RBC: 3.24 MIL/uL — ABNORMAL LOW (ref 4.22–5.81)
RDW: 16.8 % — ABNORMAL HIGH (ref 11.5–15.5)
WBC: 6.8 10*3/uL (ref 4.0–10.5)
nRBC: 0 % (ref 0.0–0.2)

## 2022-12-13 ENCOUNTER — Telehealth: Payer: Self-pay

## 2022-12-13 ENCOUNTER — Encounter: Payer: Self-pay | Admitting: Urology

## 2022-12-13 ENCOUNTER — Ambulatory Visit (INDEPENDENT_AMBULATORY_CARE_PROVIDER_SITE_OTHER): Payer: BC Managed Care – PPO | Admitting: Urology

## 2022-12-13 VITALS — BP 157/82 | HR 74 | Ht 66.0 in | Wt 147.0 lb

## 2022-12-13 DIAGNOSIS — N403 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: Secondary | ICD-10-CM

## 2022-12-13 DIAGNOSIS — R351 Nocturia: Secondary | ICD-10-CM | POA: Diagnosis not present

## 2022-12-13 DIAGNOSIS — C61 Malignant neoplasm of prostate: Secondary | ICD-10-CM

## 2022-12-13 NOTE — H&P (View-Only) (Signed)
Subjective:  12/13/22: Scott [name] returns today in f/u.  He was in the ER 9 days ago with difficulty voiding.  With nocturia 30-40x.  He had no hematuria.  He had dysuria and ankle edema.  His UA was negative but he had some benefit from pyridium.  He is currently on Pluvicto in addition to his ADT with Leuprolide, dutasteride and estradiol patches.  He is on tramadol and hydrocodone.  His IPSS is 19 with urgency and nocturia.  He had a CT that shows probable regrowth at the bladder neck.  He has no SUI but occasional UUI.  His PSA has fallen from >1500 to 734 with 2 rounds of Pluvicto.   .   06/14/22: Scott [name] returns today in f/u.  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  He had radation to spinal mets with the most recent at L4  in 1/24.  His PSA was 53.59 in 2/24 and was 44.4 on 05/30/22. He remains on dutasteride and estradiol patches.  He has discussed pluvicto but hasn't initiated that.  He has had germline and somatic testing with no actionable mutations per his report.   He has some increased LUTS but his IPSS is 6.  He has nocturia x 1-2 and occasional urgency with UUI.  He has had  12/07/21: Scott [name] returns today in f/u for leuprolide for his history of CRCP.  His PSA has continued to rise and was 3.58 in 6/23 and is now 9.84.  He last saw Dr. Ellin Saba on 11/01/21 and was switched to Darolutamide from abiaterone on 10/17/21.   He remains on dutasteride and estradiol patches as well. He had a PSMA PET on 09/28/21 and was noted to have tumor of the prostate gland and SV's and mets at L1 and L2.  There was no hydro noted.   He is on calcium and Vit D.  He had some increased voiding symptoms after starting the darolutamide but that has improved.  His IPSS is 7.  He has some low back and hip pain.  He is starting to losing weight after he gained on the prednisone.   He has discussed Pluvicto and is being considered for that.     06/01/21: Scott [name] returns today for Lupron.  His PSA is slowly rising and was  1.54 earlier this month.  He has fatigue and SOB. He has no further bone pain.   He has a stable weight. He is voiding well with [name] IPSS of 5-6 with some nocturia 1-2x.   He has had no hematuria and the UA is clear.  He continues to use estradiol and abiaterone with pred.  His hot flashes are well controlled with the estradiol.   He is on no bone targeting agents and is reluctant to add them because of dental issues.  He has started a calcium supplement.   He saw Dr. Ellin Saba on 05/23/21.   12/01/20: Scott [name] returns today in f/u for Lupron.  He has been dealing with shingles on the left flank and upper leg for the last 3-4 weeks but that is improving.  His last PSA in 8/22 was down to 0.89.  He started Zytiga in 4/22 and had lumbar SBRT in 5/22.   He has done well with that and his bone pain has improved.  He is voiding well with [name] IPSS of 2-3.  His Alk phos has normalized.    06/02/20: Scott [name] returns in f/u for his history of CRCP.  His PSA has been  rising despite 6 rounds of chemo.   He is seeing Dr. Karilyn Cota and is on oral estradiol 1mg  po bid but the hot flashes have returned.   He remains on dutasteride and Lupron and his T is  <3 and PSA is up to 12.9 on 05/26/20 from 4.77 on 04/28/20.  He is voiding well and his Cr was 0.8 on 2/10.  He has back pain that improved with chemo and prednisone but it has recurred.   His bone scan on 01/14/20 demonstrated stable L2 mets.  The CT showed improved left hydro with minimal residual bilateral hydro.  He is due for Lupron.  He is voiding well.  His IPSS is 6.  He has a tooth that needs extraction.     12/04/19: Scott [name] returns today in f/u from a TURP on 11/10/19 with resection of both UO's for malignant obstruction.   He is voiding better but has some frequency and nocturia 2-4x.  He has some left flank pain with voiding and probably has reflux.   His IPSS is 14.   He has had some hematuria but is improving.   He has some bone pain with the fulphila given for bone  marrow support.   He is otherwise tolerating chemo after one dose.  His Cr is down to 0.9 from 1.1 prior to the resection.   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 CT with bilateral non-bulky iliac adenopathy and tiny uptake Lt orbit (no spine mets) by bone scan.  His PSA is up to 15.5 from  9.47 at last check and a doubling over the last year. The testosterone remains castrate at <3 on Lupron and dutasteride. He remains on estradiol 0.1mg  patches for the hot flashes.   He had a telehealth visit with Dr. Kathrynn Running for consideration of radiation therapy to the primary and pelvic nodes from the Axumin PET findings on 12/11/18 but decided against that.  He is seeing Dr. Ellin Saba as well but is  reluctant to consider Zytiga because of the prednisone.   He has had no recent hematuria.  He has occasional frequency with urgency and UUI.  He wears a pad. He has a reduced stream.  He has nocturia 3-4x but he is drinking a lot of fluids.   His IPSS is 29-30.   Present Management:  01/2017 begin Lupron androgen deprivation Q74mo (declined reccomended orchiectomy)  04/2017 - PSA 2.86;  07/2017 PSA/T 3.44 / T 3 ==> Lupron 45mg .  10/08/17 PSA 2.1.  02/07/18 PSA 2.19/T 4. added Estradiol 0.1mg  patches twice weekly for the hot flashes.  03/10/18 PSA 1.87/T<3.  05/09/18 PSA 2.09/T10/ Est 32.  07/24/18 PSA 6/T 54  08/08/18 Lupron 45mg .  09/08/18 PSA 4.37  10/22/18 PSA 4.64/T<3.  11/26/18 Bone scan negative/ CT C/A/P negative. 12/11/18 [facility]umin PET small equivocal periaortic and left ext iliac nodes and right prostate activity.  I have discussed that with him.  11/28/18 PSA 6.46. 02/10/19 PSA 7.31/T <3. 03/19/19 PSA 8.20 04/11/19  Lupron 45mg  given.  04/23/19 PSA 9.96. 06/23/19 PSA 9.47. 09/28/19 PSA 15.56, T <3.  10/09/19 Lupron 45mg  given 11/10/19  Operative cysto / TURP 2018 with  inviltrative bladder neck / prostate mass ( left UO purposefully resected).  11/16/19 PSA 20.69.   11/06/19 Mr.Scott [name] has a mass at the base of the bladder consistent  with local invasion from his CRCP and he has left ureteral obstruction with a minor increase in his Cr.  He has seen Dr. Ellin Saba and is scheduled to begin chemotherapy but it was felt that decompression of the left kidney was indicated.   He is have increased voiding difficulty with passage of clots as well.    Results for Scott [name], SAFARIAN (MRN [id number]) as of 07/10/2019 08:56  Ref. Range 11/28/2018 10:23 02/10/2019 13:25 03/19/2019 13:49 04/23/2019 13:58 06/23/2019 13:41  Prostatic Specific Antigen Latest Ref Range: 0.00 - 4.00 ng/mL 6.46 (H) 7.31 (H) 8.20 (H) 9.96 (H) 9.47 (H)        ROS:  ROS:  A complete review of systems was performed.  All systems are negative except for pertinent findings as noted.   Review of Systems  Constitutional:  Negative for weight loss.  Respiratory:  Positive for shortness of breath.   Musculoskeletal:        Bone pain in pelvis, sacrum and both hips.   Neurological:  Positive for weakness.    Allergies  Allergen Reactions   Crestor [Rosuvastatin] Other (See Comments)    myalgia   Ivp Dye [Iodinated Contrast Media] Swelling    Swelling on head, took Benadryl and swelling reduced   Wellbutrin [Bupropion] Hives    Outpatient Encounter Medications as of 12/13/2022  Medication Sig   Cholecalciferol 125 MCG (5000 UT) capsule Take 10,000 Units by mouth daily.   dexamethasone (DECADRON) 4 MG tablet Take 1 tablet (4 mg total) by mouth daily. Take for three days after each Pluvicto treatment   docusate sodium (COLACE) 100 MG capsule Take 100 mg by mouth daily.   dronabinol (MARINOL) 5 MG capsule TAKE 1 CAPSULE(5 MG) BY MOUTH TWICE DAILY BEFORE A MEAL   dutasteride (AVODART) 0.5 MG capsule TAKE 1 CAPSULE(0.5 MG) BY MOUTH DAILY   Elastic Bandages & Supports (B-4 MED COMPRESSION HOSE MENS) MISC Wear  as needed when awake to help prevent leg swelling   estradiol (VIVELLE-DOT) 0.1 MG/24HR patch APPLY 1 PATCH(0.1 MG) TOPICALLY TO THE SKIN 2 TIMES A WEEK   HYDROcodone-acetaminophen (NORCO) 5-325 MG tablet Take 1 tablet by mouth every 6 (six) hours as needed for moderate pain.   MAGNESIUM PO Take 1 tablet by mouth at bedtime.   megestrol (MEGACE) 40 MG/ML suspension SHAKE LIQUID AND TAKE 10 ML(400 MG) BY MOUTH TWICE DAILY   MELATONIN PO Take 40 mg by mouth at bedtime.   metoprolol tartrate (LOPRESSOR) 25 MG tablet Take 0.5 tablets (12.5 mg total) by mouth 2 (two) times daily.   Multiple Vitamin (MULTIVITAMIN WITH MINERALS) TABS tablet Take 1 tablet by mouth daily.   NATTOKINASE PO Take 2 capsules by mouth daily.   NP THYROID 120 MG tablet Take 0.5 tablets (60 mg total) by mouth every morning.   olmesartan (BENICAR) 20 MG tablet TAKE 1 TABLET(20 MG) BY MOUTH DAILY   ondansetron (ZOFRAN ODT) 8 MG disintegrating tablet Take 1 tablet (8 mg total) by mouth every 8 (eight) hours as needed for nausea or vomiting.   phenazopyridine (PYRIDIUM) 200 MG tablet Take 1 tablet (200 mg total) by mouth 3 (three) times daily.   prochlorperazine (COMPAZINE) 10 MG tablet Take 10 mg by mouth daily.   prochlorperazine (COMPAZINE) 25 MG suppository Place 1 suppository (25 mg total) rectally every 8 (eight) hours as needed for nausea or vomiting.   pyridOXINE (VITAMIN B6) 25 MG tablet Take 25 mg by mouth daily.   thiamine (VITAMIN B-1) 50 MG tablet  Take 1 tablet (50 mg total) by mouth daily.   traMADol (ULTRAM) 50 MG tablet Take 2 tablets (100 mg total) by mouth every 6 (six) hours as needed.   vitamin E 180 MG (400 UNITS) capsule Take 400 Units by mouth daily.   VITAMIN K PO Take 1 capsule by mouth daily.   No facility-administered encounter medications on file as of 12/13/2022.    Past Medical History:  Diagnosis Date   Anemia    Bladder obstruction    Complication of anesthesia    Diverticulitis 2015    Dyspnea    Essential hypertension, benign 01/26/2019   at doctor's office elevated, normal at home, no medications at this time   GERD (gastroesophageal reflux disease)    History of basal cell cancer    HLD (hyperlipidemia) 01/26/2019   Hypothyroidism    history of, not currently taking medication   PONV (postoperative nausea and vomiting)    Prostate cancer (HCC)    metastatic to bones   Vitamin D deficiency disease 01/26/2019    Past Surgical History:  Procedure Laterality Date   CYSTOSCOPY W/ URETERAL STENT PLACEMENT Left 11/10/2019   Procedure: CYSTOSCOPY;  Surgeon: Bjorn Pippin, MD;  Location: [facility];  Service: Urology;  Laterality: Left;   ESOPHAGOGASTRODUODENOSCOPY (EGD) WITH PROPOFOL N/A 07/07/2020   Procedure: ESOPHAGOGASTRODUODENOSCOPY (EGD) WITH PROPOFOL;  Surgeon: Corbin Ade, MD;  Location: [facility];  Service: Endoscopy;  Laterality: N/A;  pm appt   HERNIA REPAIR  1995   UMBILICAL    IR FLUORO GUIDED NEEDLE PLC ASPIRATION/INJECTION LOC  04/09/2022   IR IMAGING GUIDED PORT INSERTION  10/11/2022   MALONEY DILATION N/A 07/07/2020   Procedure: Elease Hashimoto DILATION;  Surgeon: Corbin Ade, MD;  Location: [facility];  Service: Endoscopy;  Laterality: N/A;   PROSTATE SURGERY     SKIN CANCER EXCISION     TONSILLECTOMY Bilateral    TOOTH EXTRACTION     front left tooth pulled but no surgery or stitches   TRANSURETHRAL RESECTION OF PROSTATE N/A 01/25/2017   Procedure: TRANSURETHRAL RESECTION OF THE PROSTATE (TURP);  Surgeon: Sebastian Ache, MD;  Location: [facility];  Service: Urology;  Laterality: N/A;   TRANSURETHRAL RESECTION OF PROSTATE N/A 11/10/2019   Procedure: TRANSURETHRAL RESECTION OF THE PROSTATE (TURP);  Surgeon: Bjorn Pippin, MD;  Location: [facility];  Service: Urology;  Laterality: N/A;   WISDOM TOOTH EXTRACTION      Social History   Socioeconomic History   Marital status: Married    Spouse name: Not on file   Number of children: 1   Years of education: Not  on file   Highest education level: Bachelor's degree (e.g., BA, AB, BS)  Occupational History    Comment: working full time  Tobacco Use   Smoking status: Former    Current packs/day: 0.00    Average packs/day: 1.5 packs/day for 35.0 years (52.5 ttl pk-yrs)    Types: Cigarettes    Start date: 01/11/1969    Quit date: 01/12/2004    Years since quitting: 18.9   Smokeless tobacco: Never   Tobacco comments:    OVER 30 YEARS HX OF SMOKING   Vaping Use   Vaping status: Never Used  Substance and Sexual Activity   Alcohol use: Not Currently    Comment: heavy drinker until 2004   Drug use: Not Currently    Types: Marijuana   Sexual activity: Not Currently  Other Topics Concern   Not on file  Social History  Narrative   Married for 19 years.Works for [employer] from home.   Social Determinants of Health   Financial Resource Strain: Low Risk  (08/24/2022)   Overall Financial Resource Strain (CARDIA)    Difficulty of Paying Living Expenses: Not very hard  Food Insecurity: No Food Insecurity (08/24/2022)   Hunger Vital Sign    Worried About Running Out of Food in the Last Year: Never true    Ran Out of Food in the Last Year: Never true  Transportation Needs: No Transportation Needs (08/24/2022)   PRAPARE - Administrator, Civil Service (Medical): No    Lack of Transportation (Non-Medical): No  Physical Activity: Insufficiently Active (08/24/2022)   Exercise Vital Sign    Days of Exercise per Week: 5 days    Minutes of Exercise per Session: 20 min  Stress: Stress Concern Present (08/24/2022)   [employer] of Occupational Health - Occupational Stress Questionnaire    Feeling of Stress : To some extent  Social Connections: Socially Isolated (08/24/2022)   Social Connection and Isolation Panel [NHANES]    Frequency of Communication with Friends and Family: Once a week    Frequency of Social Gatherings with Friends and Family: Once a week    Attends Religious Services:  Never    Database administrator or Organizations: No    Attends Engineer, structural: Not on file    Marital Status: Married  Catering manager Violence: Not At Risk (03/02/2020)   Humiliation, Afraid, Rape, and Kick questionnaire    Fear of Current or Ex-Partner: No    Emotionally Abused: No    Physically Abused: No    Sexually Abused: No    Family History  Problem Relation Age of Onset   Emphysema Mother    Alzheimer's disease Father    Prostate cancer Father        dx in his 40s   Prostate cancer Brother        dx in his 56s   Breast cancer Maternal Grandmother        60s   Pancreatic cancer Maternal Grandfather        71s   Dementia Paternal Grandmother    Heart attack Paternal Grandfather    Colon cancer Neg Hx        Objective: Vitals:   12/13/22 0927  BP: (!) 157/82  Pulse: 74     Physical Exam Vitals reviewed.  Constitutional:      Appearance: He is ill-appearing.  Cardiovascular:     Rate and Rhythm: Normal rate and regular rhythm.     Heart sounds: Normal heart sounds.  Pulmonary:     Effort: Pulmonary effort is normal.     Breath sounds: Normal breath sounds.  Neurological:     Mental Status: He is alert.   I  Recent Results (from the past 2160 hour(s))  CBC with Differential     Status: Abnormal   Collection Time: 09/19/22  1:21 PM  Result Value Ref Range   WBC 5.4 4.0 - 10.5 K/uL   RBC 3.81 (L) 4.22 - 5.81 MIL/uL   Hemoglobin 11.6 (L) 13.0 - 17.0 g/dL   HCT 57.8 (L) 46.9 - 62.9 %   MCV 90.3 80.0 - 100.0 fL   MCH 30.4 26.0 - 34.0 pg   MCHC 33.7 30.0 - 36.0 g/dL   RDW 52.8 41.3 - 24.4 %   Platelets 230 150 - 400 K/uL   nRBC 0.0 0.0 - 0.2 %  Neutrophils Relative % 75 %   Neutro Abs 4.1 1.7 - 7.7 K/uL   Lymphocytes Relative 15 %   Lymphs Abs 0.8 0.7 - 4.0 K/uL   Monocytes Relative 8 %   Monocytes Absolute 0.4 0.1 - 1.0 K/uL   Eosinophils Relative 1 %   Eosinophils Absolute 0.1 0.0 - 0.5 K/uL   Basophils Relative 1 %    Basophils Absolute 0.1 0.0 - 0.1 K/uL   Immature Granulocytes 0 %   Abs Immature Granulocytes 0.02 0.00 - 0.07 K/uL    Comment: Performed at [facility], [address], [place], [place] [address]  Comprehensive metabolic panel     Status: Abnormal   Collection Time: 09/19/22  1:21 PM  Result Value Ref Range   Sodium 129 (L) 135 - 145 mmol/L   Potassium 4.0 3.5 - 5.1 mmol/L   Chloride 98 98 - 111 mmol/L   CO2 22 22 - 32 mmol/L   Glucose, Bld 118 (H) 70 - 99 mg/dL    Comment: Glucose reference range applies only to samples taken after fasting for at least 8 hours.   BUN 15 8 - 23 mg/dL   Creatinine, Ser 6.04 0.61 - 1.24 mg/dL   Calcium 9.0 8.9 - 54.0 mg/dL   Total Protein 6.6 6.5 - 8.1 g/dL   Albumin 3.8 3.5 - 5.0 g/dL   AST 25 15 - 41 U/L   ALT 13 0 - 44 U/L   Alkaline Phosphatase 519 (H) 38 - 126 U/L   Total Bilirubin 0.8 0.3 - 1.2 mg/dL   GFR, Estimated >98 >11 mL/min    Comment: (NOTE) Calculated using the CKD-EPI Creatinine Equation (2021)    Anion gap 9 5 - 15    Comment: Performed at [facility], [address] [place], [place] [address]  PSA     Status: Abnormal   Collection Time: 09/19/22  1:21 PM  Result Value Ref Range   Prostatic Specific Antigen 285.64 (H) 0.00 - 4.00 ng/mL    Comment: (NOTE) While PSA levels of <=4.00 ng/ml are reported as reference range, some men with levels below 4.00 ng/ml can have prostate cancer and many men with PSA above 4.00 ng/ml do not have prostate cancer.  Other tests such as free PSA, age specific reference ranges, PSA velocity and PSA doubling time may be helpful especially in men less than 25 years old. Performed at [facility], [address], [place], [place] [address]   CBC with Differential     Status: Abnormal   Collection Time: 09/28/22  9:25 AM  Result Value Ref Range   WBC 5.9 4.0 - 10.5 K/uL   RBC 4.12 (L) 4.22 - 5.81 MIL/uL   Hemoglobin 12.8 (L) 13.0 - 17.0 g/dL   HCT 13.0 (L) 86.5 - 78.4 %   MCV 89.1  80.0 - 100.0 fL   MCH 31.1 26.0 - 34.0 pg   MCHC 34.9 30.0 - 36.0 g/dL   RDW 69.6 29.5 - 28.4 %   Platelets 218 150 - 400 K/uL   nRBC 0.0 0.0 - 0.2 %   Neutrophils Relative % 78 %   Neutro Abs 4.6 1.7 - 7.7 K/uL   Lymphocytes Relative 11 %   Lymphs Abs 0.7 0.7 - 4.0 K/uL   Monocytes Relative 8 %   Monocytes Absolute 0.5 0.1 - 1.0 K/uL   Eosinophils Relative 1 %   Eosinophils Absolute 0.0 0.0 - 0.5 K/uL   Basophils Relative 1 %  Basophils Absolute 0.0 0.0 - 0.1 K/uL   Immature Granulocytes 1 %   Abs Immature Granulocytes 0.04 0.00 - 0.07 K/uL    Comment: Performed at [facility], [address], [place], [place] [address]  Comprehensive metabolic panel     Status: Abnormal   Collection Time: 09/28/22  9:25 AM  Result Value Ref Range   Sodium 129 (L) 135 - 145 mmol/L   Potassium 3.3 (L) 3.5 - 5.1 mmol/L   Chloride 96 (L) 98 - 111 mmol/L   CO2 21 (L) 22 - 32 mmol/L   Glucose, Bld 105 (H) 70 - 99 mg/dL    Comment: Glucose reference range applies only to samples taken after fasting for at least 8 hours.   BUN 12 8 - 23 mg/dL   Creatinine, Ser 6.21 0.61 - 1.24 mg/dL   Calcium 9.0 8.9 - 30.8 mg/dL   Total Protein 6.9 6.5 - 8.1 g/dL   Albumin 3.9 3.5 - 5.0 g/dL   AST 37 15 - 41 U/L   ALT 18 0 - 44 U/L   Alkaline Phosphatase 937 (H) 38 - 126 U/L   Total Bilirubin 1.0 0.3 - 1.2 mg/dL   GFR, Estimated >65 >78 mL/min    Comment: (NOTE) Calculated using the CKD-EPI Creatinine Equation (2021)    Anion gap 12 5 - 15    Comment: Performed at [facility], [address] [place], [place] [address]  PSA     Status: Abnormal   Collection Time: 09/28/22  9:25 AM  Result Value Ref Range   Prostatic Specific Antigen 520.98 (H) 0.00 - 4.00 ng/mL    Comment: (NOTE) While PSA levels of <=4.00 ng/ml are reported as reference range, some men with levels below 4.00 ng/ml can have prostate cancer and many men with PSA above 4.00 ng/ml do not have prostate cancer.  Other tests such as free PSA,  age specific reference ranges, PSA velocity and PSA doubling time may be helpful especially in men less than 54 years old. Performed at [facility], [address], [place], [place] [address]   Sample to Blood Bank(Blood Bank Hold)     Status: None   Collection Time: 10/09/22 11:13 AM  Result Value Ref Range   Blood Bank Specimen SAMPLE AVAILABLE FOR TESTING    Sample Expiration      10/10/2022,2359 Performed at [facility], [address], [place], [place] [address]   Magnesium     Status: None   Collection Time: 10/09/22 11:13 AM  Result Value Ref Range   Magnesium 2.1 1.7 - 2.4 mg/dL    Comment: Performed at [facility], [address] [place], [place] [address]  Comprehensive metabolic panel     Status: Abnormal   Collection Time: 10/09/22 11:13 AM  Result Value Ref Range   Sodium 127 (L) 135 - 145 mmol/L   Potassium 3.7 3.5 - 5.1 mmol/L   Chloride 94 (L) 98 - 111 mmol/L   CO2 20 (L) 22 - 32 mmol/L   Glucose, Bld 118 (H) 70 - 99 mg/dL    Comment: Glucose reference range applies only to samples taken after fasting for at least 8 hours.   BUN 13 8 - 23 mg/dL   Creatinine, Ser 2.53 0.61 - 1.24 mg/dL   Calcium 9.1 8.9 - 66.4 mg/dL   Total Protein 7.1 6.5 - 8.1 g/dL   Albumin 4.0 3.5 - 5.0 g/dL   AST 23 15 - 41 U/L   ALT  14 0 - 44 U/L   Alkaline Phosphatase 676 (H) 38 - 126 U/L   Total Bilirubin 1.0 0.3 - 1.2 mg/dL   GFR, Estimated >62 >13 mL/min    Comment: (NOTE) Calculated using the CKD-EPI Creatinine Equation (2021)    Anion gap 13 5 - 15    Comment: Performed at [facility], [address], [place], [place] [address]  CBC with Differential     Status: Abnormal   Collection Time: 10/09/22 11:13 AM  Result Value Ref Range   WBC 5.6 4.0 - 10.5 K/uL   RBC 4.25 4.22 - 5.81 MIL/uL   Hemoglobin 13.0 13.0 - 17.0 g/dL   HCT 84.6 (L) 96.2 - 95.2 %   MCV 88.5 80.0 - 100.0 fL   MCH 30.6 26.0 - 34.0 pg   MCHC 34.6 30.0 - 36.0 g/dL   RDW 84.1 32.4 - 40.1 %    Platelets 272 150 - 400 K/uL   nRBC 0.0 0.0 - 0.2 %   Neutrophils Relative % 75 %   Neutro Abs 4.2 1.7 - 7.7 K/uL   Lymphocytes Relative 11 %   Lymphs Abs 0.6 (L) 0.7 - 4.0 K/uL   Monocytes Relative 11 %   Monocytes Absolute 0.6 0.1 - 1.0 K/uL   Eosinophils Relative 1 %   Eosinophils Absolute 0.1 0.0 - 0.5 K/uL   Basophils Relative 1 %   Basophils Absolute 0.1 0.0 - 0.1 K/uL   Immature Granulocytes 1 %   Abs Immature Granulocytes 0.03 0.00 - 0.07 K/uL    Comment: Performed at [facility], [address] [place], [place] [address]  CBG monitoring, ED     Status: Abnormal   Collection Time: 10/22/22 12:17 PM  Result Value Ref Range   Glucose-Capillary 124 (H) 70 - 99 mg/dL    Comment: Glucose reference range applies only to samples taken after fasting for at least 8 hours.  Lipase, blood     Status: None   Collection Time: 10/22/22 12:42 PM  Result Value Ref Range   Lipase 27 11 - 51 U/L    Comment: Performed at [facility], [address] [place], [place] [address]  Comprehensive metabolic panel     Status: Abnormal   Collection Time: 10/22/22 12:42 PM  Result Value Ref Range   Sodium 124 (L) 135 - 145 mmol/L   Potassium 3.0 (L) 3.5 - 5.1 mmol/L   Chloride 93 (L) 98 - 111 mmol/L   CO2 21 (L) 22 - 32 mmol/L   Glucose, Bld 121 (H) 70 - 99 mg/dL    Comment: Glucose reference range applies only to samples taken after fasting for at least 8 hours.   BUN 11 8 - 23 mg/dL   Creatinine, Ser 0.34 0.61 - 1.24 mg/dL   Calcium 8.5 (L) 8.9 - 10.3 mg/dL   Total Protein 6.8 6.5 - 8.1 g/dL   Albumin 3.8 3.5 - 5.0 g/dL   AST 25 15 - 41 U/L   ALT 13 0 - 44 U/L   Alkaline Phosphatase 1,283 (H) 38 - 126 U/L   Total Bilirubin 1.6 (H) 0.3 - 1.2 mg/dL   GFR, Estimated >74 >25 mL/min    Comment: (NOTE) Calculated using the CKD-EPI Creatinine Equation (2021)    Anion gap 10 5 - 15    Comment: Performed at [facility], [address] [place], [place] [address]  CBC     Status: Abnormal    Collection Time: 10/22/22 12:42 PM  Result  Value Ref Range   WBC 6.6 4.0 - 10.5 K/uL   RBC 4.21 (L) 4.22 - 5.81 MIL/uL   Hemoglobin 13.0 13.0 - 17.0 g/dL   HCT 04.5 (L) 40.9 - 81.1 %   MCV 84.6 80.0 - 100.0 fL   MCH 30.9 26.0 - 34.0 pg   MCHC 36.5 (H) 30.0 - 36.0 g/dL   RDW 91.4 78.2 - 95.6 %   Platelets 235 150 - 400 K/uL   nRBC 0.0 0.0 - 0.2 %    Comment: Performed at [facility], [address] [place], [place] [address]  Magnesium     Status: None   Collection Time: 10/22/22 12:42 PM  Result Value Ref Range   Magnesium 1.9 1.7 - 2.4 mg/dL    Comment: Performed at [facility], [address], [place], [place] [address]  Urinalysis, Routine w reflex microscopic -Urine, Clean Catch     Status: Abnormal   Collection Time: 10/22/22  2:46 PM  Result Value Ref Range   Color, Urine YELLOW YELLOW   APPearance CLEAR CLEAR   Specific Gravity, Urine 1.006 1.005 - 1.030   pH 6.0 5.0 - 8.0   Glucose, UA NEGATIVE NEGATIVE mg/dL   Hgb urine dipstick NEGATIVE NEGATIVE   Bilirubin Urine NEGATIVE NEGATIVE   Ketones, ur 5 (A) NEGATIVE mg/dL   Protein, ur NEGATIVE NEGATIVE mg/dL   Nitrite NEGATIVE NEGATIVE   Leukocytes,Ua NEGATIVE NEGATIVE    Comment: Performed at [facility], [address], [place], [place] [address]  PSA     Status: Abnormal   Collection Time: 10/30/22  1:39 PM  Result Value Ref Range   Prostatic Specific Antigen >1,500.00 (H) 0.00 - 4.00 ng/mL    Comment: RESULT CONFIRMED BY AUTOMATED DILUTION (NOTE) While PSA levels of <=4.00 ng/ml are reported as reference range, some men with levels below 4.00 ng/ml can have prostate cancer and many men with PSA above 4.00 ng/ml do not have prostate cancer.  Other tests such as free PSA, age specific reference ranges, PSA velocity and PSA doubling time may be helpful especially in men less than 38 years old. Performed at [facility], [address] [place], [place] [address]   CBC with Differential/Platelet     Status:  Abnormal   Collection Time: 10/30/22  1:39 PM  Result Value Ref Range   WBC 5.5 4.0 - 10.5 K/uL   RBC 3.48 (L) 4.22 - 5.81 MIL/uL   Hemoglobin 10.6 (L) 13.0 - 17.0 g/dL   HCT 32.4 (L) 40.1 - 02.7 %   MCV 86.8 80.0 - 100.0 fL   MCH 30.5 26.0 - 34.0 pg   MCHC 35.1 30.0 - 36.0 g/dL   RDW 25.3 66.4 - 40.3 %   Platelets 221 150 - 400 K/uL   nRBC 0.0 0.0 - 0.2 %   Neutrophils Relative % 72 %   Neutro Abs 4.0 1.7 - 7.7 K/uL   Lymphocytes Relative 17 %   Lymphs Abs 0.9 0.7 - 4.0 K/uL   Monocytes Relative 9 %   Monocytes Absolute 0.5 0.1 - 1.0 K/uL   Eosinophils Relative 0 %   Eosinophils Absolute 0.0 0.0 - 0.5 K/uL   Basophils Relative 1 %   Basophils Absolute 0.0 0.0 - 0.1 K/uL   Immature Granulocytes 1 %   Abs Immature Granulocytes 0.03 0.00 - 0.07 K/uL    Comment: Performed at [facility], [address] [place], [place] [address]  Comprehensive metabolic panel  Status: Abnormal   Collection Time: 10/30/22  1:39 PM  Result Value Ref Range   Sodium 126 (L) 135 - 145 mmol/L   Potassium 3.5 3.5 - 5.1 mmol/L   Chloride 97 (L) 98 - 111 mmol/L   CO2 19 (L) 22 - 32 mmol/L   Glucose, Bld 104 (H) 70 - 99 mg/dL    Comment: Glucose reference range applies only to samples taken after fasting for at least 8 hours.   BUN 9 8 - 23 mg/dL   Creatinine, Ser 8.65 0.61 - 1.24 mg/dL   Calcium 8.6 (L) 8.9 - 10.3 mg/dL   Total Protein 6.0 (L) 6.5 - 8.1 g/dL   Albumin 3.2 (L) 3.5 - 5.0 g/dL   AST 27 15 - 41 U/L   ALT 17 0 - 44 U/L   Alkaline Phosphatase 890 (H) 38 - 126 U/L   Total Bilirubin 0.5 0.3 - 1.2 mg/dL   GFR, Estimated >78 >46 mL/min    Comment: (NOTE) Calculated using the CKD-EPI Creatinine Equation (2021)    Anion gap 10 5 - 15    Comment: Performed at [facility], [address] [place], [place] [address]  Magnesium     Status: None   Collection Time: 10/30/22  1:39 PM  Result Value Ref Range   Magnesium 2.3 1.7 - 2.4 mg/dL    Comment: Performed at [facility], [address], [place], [place] [address]  Urinalysis, Routine w reflex microscopic -Urine, Clean Catch     Status: Abnormal   Collection Time: 12/04/22  2:19 PM  Result Value Ref Range   Color, Urine YELLOW YELLOW   APPearance CLEAR CLEAR   Specific Gravity, Urine 1.023 1.005 - 1.030   pH 5.0 5.0 - 8.0   Glucose, UA NEGATIVE NEGATIVE mg/dL   Hgb urine dipstick NEGATIVE NEGATIVE   Bilirubin Urine NEGATIVE NEGATIVE   Ketones, ur NEGATIVE NEGATIVE mg/dL   Protein, ur 30 (A) NEGATIVE mg/dL   Nitrite NEGATIVE NEGATIVE   Leukocytes,Ua NEGATIVE NEGATIVE   RBC / HPF 0-5 0 - 5 RBC/hpf   WBC, UA 0-5 0 - 5 WBC/hpf   Bacteria, UA NONE SEEN NONE SEEN   Squamous Epithelial / HPF 0-5 0 - 5 /HPF   Mucus PRESENT     Comment: Performed at [facility], [address], [place], [place] [address]  Comprehensive metabolic panel     Status: Abnormal   Collection Time: 12/04/22  3:32 PM  Result Value Ref Range   Sodium 130 (L) 135 - 145 mmol/L   Potassium 3.9 3.5 - 5.1 mmol/L   Chloride 99 98 - 111 mmol/L   CO2 22 22 - 32 mmol/L   Glucose, Bld 99 70 - 99 mg/dL    Comment: Glucose reference range applies only to samples taken after fasting for at least 8 hours.   BUN 11 8 - 23 mg/dL   Creatinine, Ser 0.27 0.61 - 1.24 mg/dL   Calcium 8.2 (L) 8.9 - 10.3 mg/dL   Total Protein 6.6 6.5 - 8.1 g/dL   Albumin 3.4 (L) 3.5 - 5.0 g/dL   AST 26 15 - 41 U/L   ALT 47 (H) 0 - 44 U/L   Alkaline Phosphatase 913 (H) 38 - 126 U/L   Total Bilirubin 0.8 0.3 - 1.2 mg/dL   GFR, Estimated >25 >36 mL/min    Comment: (NOTE) Calculated using the CKD-EPI Creatinine Equation (2021)    Anion gap 9 5 - 15  Comment: Performed at [facility], [address], [place], [place] [address]  CBC with Differential     Status: Abnormal   Collection Time: 12/04/22  3:32 PM  Result Value Ref Range   WBC 5.7 4.0 - 10.5 K/uL   RBC 3.69 (L) 4.22 - 5.81 MIL/uL   Hemoglobin 11.5 (L) 13.0 - 17.0 g/dL   HCT 86.5 (L) 78.4 - 69.6 %   MCV 91.9 80.0  - 100.0 fL   MCH 31.2 26.0 - 34.0 pg   MCHC 33.9 30.0 - 36.0 g/dL   RDW 29.5 (H) 28.4 - 13.2 %   Platelets 289 150 - 400 K/uL   nRBC 0.0 0.0 - 0.2 %   Neutrophils Relative % 70 %   Neutro Abs 4.0 1.7 - 7.7 K/uL   Lymphocytes Relative 16 %   Lymphs Abs 0.9 0.7 - 4.0 K/uL   Monocytes Relative 10 %   Monocytes Absolute 0.6 0.1 - 1.0 K/uL   Eosinophils Relative 0 %   Eosinophils Absolute 0.0 0.0 - 0.5 K/uL   Basophils Relative 1 %   Basophils Absolute 0.1 0.0 - 0.1 K/uL   Immature Granulocytes 3 %   Abs Immature Granulocytes 0.14 (H) 0.00 - 0.07 K/uL    Comment: Performed at [facility], [address] [place], [place] [address]  Brain natriuretic peptide     Status: Abnormal   Collection Time: 12/04/22  3:32 PM  Result Value Ref Range   B Natriuretic Peptide 271.0 (H) 0.0 - 100.0 pg/mL    Comment: Performed at [facility], [address] [place], [place] [address]  Magnesium     Status: None   Collection Time: 12/06/22  2:24 PM  Result Value Ref Range   Magnesium 2.2 1.7 - 2.4 mg/dL    Comment: Performed at [facility], [address] [place], [place] [address]  CBC with Differential     Status: Abnormal   Collection Time: 12/06/22  2:24 PM  Result Value Ref Range   WBC 6.2 4.0 - 10.5 K/uL   RBC 3.16 (L) 4.22 - 5.81 MIL/uL   Hemoglobin 10.0 (L) 13.0 - 17.0 g/dL   HCT 34.7 (L) 42.5 - 95.6 %   MCV 92.4 80.0 - 100.0 fL   MCH 31.6 26.0 - 34.0 pg   MCHC 34.2 30.0 - 36.0 g/dL   RDW 38.7 (H) 56.4 - 33.2 %   Platelets 237 150 - 400 K/uL   nRBC 0.0 0.0 - 0.2 %   Neutrophils Relative % 81 %   Neutro Abs 5.1 1.7 - 7.7 K/uL   Lymphocytes Relative 10 %   Lymphs Abs 0.6 (L) 0.7 - 4.0 K/uL   Monocytes Relative 6 %   Monocytes Absolute 0.4 0.1 - 1.0 K/uL   Eosinophils Relative 1 %   Eosinophils Absolute 0.0 0.0 - 0.5 K/uL   Basophils Relative 0 %   Basophils Absolute 0.0 0.0 - 0.1 K/uL   Immature Granulocytes 2 %   Abs Immature Granulocytes 0.10 (H) 0.00 - 0.07 K/uL    Comment:  Performed at [facility], [address], [place], [place] [address]  Comprehensive metabolic panel     Status: Abnormal   Collection Time: 12/06/22  2:24 PM  Result Value Ref Range   Sodium 130 (L) 135 - 145 mmol/L   Potassium 4.1 3.5 - 5.1 mmol/L   Chloride 104 98 - 111 mmol/L   CO2 18 (L) 22 - 32 mmol/L   Glucose, Bld 99  70 - 99 mg/dL    Comment: Glucose reference range applies only to samples taken after fasting for at least 8 hours.   BUN 13 8 - 23 mg/dL   Creatinine, Ser 3.32 (L) 0.61 - 1.24 mg/dL   Calcium 8.0 (L) 8.9 - 10.3 mg/dL   Total Protein 5.9 (L) 6.5 - 8.1 g/dL   Albumin 3.1 (L) 3.5 - 5.0 g/dL   AST 26 15 - 41 U/L   ALT 37 0 - 44 U/L   Alkaline Phosphatase 741 (H) 38 - 126 U/L   Total Bilirubin 0.6 0.3 - 1.2 mg/dL   GFR, Estimated >95 >18 mL/min    Comment: (NOTE) Calculated using the CKD-EPI Creatinine Equation (2021)    Anion gap 8 5 - 15    Comment: Performed at [facility], [address], [place], [place] [address]  PSA     Status: Abnormal   Collection Time: 12/06/22  2:24 PM  Result Value Ref Range   Prostatic Specific Antigen 734.92 (H) 0.00 - 4.00 ng/mL    Comment: (NOTE) While PSA levels of <=4.00 ng/ml are reported as reference range, some men with levels below 4.00 ng/ml can have prostate cancer and many men with PSA above 4.00 ng/ml do not have prostate cancer.  Other tests such as free PSA, age specific reference ranges, PSA velocity and PSA doubling time may be helpful especially in men less than 57 years old. Performed at [facility], [address], [place], [place] [address]   Basic metabolic panel per protocol     Status: Abnormal   Collection Time: 12/11/22  2:50 PM  Result Value Ref Range   Sodium 132 (L) 135 - 145 mmol/L   Potassium 4.3 3.5 - 5.1 mmol/L   Chloride 104 98 - 111 mmol/L   CO2 21 (L) 22 - 32 mmol/L   Glucose, Bld 91 70 - 99 mg/dL    Comment: Glucose reference range applies only to samples taken after fasting for  at least 8 hours.   BUN 16 8 - 23 mg/dL   Creatinine, Ser 6.01 0.61 - 1.24 mg/dL   Calcium 8.5 (L) 8.9 - 10.3 mg/dL   GFR, Estimated >09 >32 mL/min    Comment: (NOTE) Calculated using the CKD-EPI Creatinine Equation (2021)    Anion gap 7 5 - 15    Comment: Performed at [facility], [address], [place], [place] [address]  CBC per protocol     Status: Abnormal   Collection Time: 12/11/22  2:50 PM  Result Value Ref Range   WBC 6.8 4.0 - 10.5 K/uL   RBC 3.24 (L) 4.22 - 5.81 MIL/uL   Hemoglobin 10.4 (L) 13.0 - 17.0 g/dL   HCT 22.0 (L) 25.4 - 27.0 %   MCV 96.9 80.0 - 100.0 fL   MCH 32.1 26.0 - 34.0 pg   MCHC 33.1 30.0 - 36.0 g/dL   RDW 62.3 (H) 76.2 - 83.1 %   Platelets 258 150 - 400 K/uL   nRBC 0.0 0.0 - 0.2 %    Comment: Performed at [facility], [address] [place], [place] [address]  Type and screen [facility]     Status: None   Collection Time: 12/11/22  2:50 PM  Result Value Ref Range   ABO/RH(D) AB POS    Antibody Screen NEG    Sample Expiration 12/25/2022,2359    Extend sample reason      NO  TRANSFUSIONS OR PREGNANCY IN THE PAST 3 MONTHS Performed at [facility], [address], [place], [place] [address]    I reviewed the records from his visits with Dr. Ellin Saba and his CT report.  MR PELVIS W WO CONTRAST  Result Date: 12/04/2022 CLINICAL DATA:  Bladder mass, status post TURP, history of prostate cancer EXAM: MRI PELVIS WITHOUT AND WITH CONTRAST TECHNIQUE: Multiplanar multisequence MR imaging of the pelvis was performed both before and after administration of intravenous contrast. CONTRAST:  7mL GADAVIST GADOBUTROL 1 MMOL/ML IV SOLN COMPARISON:  CT abdomen/pelvis dated 12/04/2022 FINDINGS: Urinary Tract: Bladder is mildly thick-walled although underdistended. Soft tissue along the bladder base, described below. Bowel:  Sigmoid diverticulosis, without evidence of diverticulitis.  Vascular/Lymphatic: No evidence of abdominal aortic aneurysm. No suspicious abdominal lymphadenopathy. 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 Additional enhancing tumor involving the base of the seminal vesicles (series 8/image 51). Other:  Trace presacral fluid. Musculoskeletal: Multifocal osseous metastases. 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 If there is concern for primary bladder carcinoma, consider cystoscopy. Additional enhancing tumor involving the base of the seminal vesicles. Multifocal osseous metastases. Electronically Signed   By: Charline Bills M.D.   On: 12/04/2022 20:14   CT ABDOMEN PELVIS WO CONTRAST  Result Date: 12/04/2022 CLINICAL DATA:  Bladder dysfunction. History of metastatic prostate cancer. Concern for bladder mass. * Tracking Code: BO * EXAM: CT ABDOMEN AND PELVIS WITHOUT CONTRAST TECHNIQUE: Multidetector CT imaging of the abdomen and pelvis was performed following the standard protocol without IV contrast. RADIATION DOSE REDUCTION: This exam was performed according to the departmental dose-optimization program which includes automated exposure control, adjustment of the mA and/or kV according to patient size and/or use of iterative reconstruction technique. COMPARISON:  CT 07/26/2022 FINDINGS: Lower chest: 4 mm subpleural left lower lobe lung nodule on image 40 of series 4. This measured 3 mm previously, similar. There is some bilateral lung base areas of atelectasis. Breathing motion. Stable 2-3 mm nodule subpleural right lower lobe series 4, image 17 as well. No pleural effusion. Gynecomastia. Hepatobiliary: Stones in the nondilated gallbladder. On this non IV contrast exam, no clear space-occupying liver lesion. Evaluation for  solid organ pathology is limited without the advantage of IV contrast. Pancreas: Global mild atrophy of the pancreas particularly the head neck and body region. Unchanged from previous. Spleen: Normal in size without focal abnormality. Adrenals/Urinary Tract: Punctate calcification along the right adrenal gland. Stable. Normal left adrenal. No abnormal calcification seen within either kidney nor along the course of either ureter. Slight ectasia of the distal ureters, nonspecific. Preserved contours of the urinary bladder with some wall thickening and stranding. Stomach/Bowel: There is fluid and debris along the stomach. Large bowel has a normal course and caliber with moderate colonic stool. Few distal colonic diverticula. The appendix is grossly preserved in the right lower quadrant and is nondilated. Small bowel is nondilated. Vascular/Lymphatic: Aortic atherosclerosis. No enlarged abdominal or pelvic lymph nodes. Reproductive: Lobular prostate with TURP defect and calcifications. Previously there was a enhancing nodule along the margin of the TURP defect inferiorly. This has less well defined without the advantage of IV contrast but the contour deformity is still present on series 2, image 74. Additional evaluation as clinically appropriate. MRI may be of some benefit as [name] option Other: Nonspecific  presacral fat stranding, increased from previous. Small bilateral fat containing inguinal hernias. Epigastric midline anterior abdominal wall moderate fat containing hernia. The fat in the hernia sac is with some stranding. This was seen previously. Musculoskeletal: Extensive sclerotic bone metastases again identified. Comparing current to prior the extent distribution of the areas is markedly increased throughout the pelvis, proximal femurs, spine and ribs. There is also sternal lesions. IMPRESSION: History of prostate neoplasm with marked increase in sclerotic bone metastases compared to the prior CT scan of May  2024. Nodular prostate with mass effect along the bladder. Stable bladder wall thickening and stranding. The nodular area in the area of the TURP defect of the prostate on the prior is suggested again today but less well seen without the advantage of IV contrast. Additional workup is recommended as clinically appropriate such as MRI. Increasing presacral fat stranding. Gallstones. Gynecomastia. Tiny lung nodules. Additional follow up as per the patient's neoplasm. Electronically Signed   By: Karen Kays M.D.   On: 12/04/2022 17:52   CT HEAD WO CONTRAST ( )  Result Date: 12/04/2022 CLINICAL DATA:  Trauma, pain EXAM: CT HEAD WITHOUT CONTRAST CT MAXILLOFACIAL WITHOUT CONTRAST CT CERVICAL SPINE WITHOUT CONTRAST TECHNIQUE: Multidetector CT imaging of the head, cervical spine, and maxillofacial structures were performed using the standard protocol without intravenous contrast. Multiplanar CT image reconstructions of the cervical spine and maxillofacial structures were also generated. RADIATION DOSE REDUCTION: This exam was performed according to the departmental dose-optimization program which includes automated exposure control, adjustment of the mA and/or kV according to patient size and/or use of iterative reconstruction technique. COMPARISON:  None Available. FINDINGS: CT HEAD FINDINGS Brain: There is no acute intracranial hemorrhage, extra-axial fluid collection, or acute territorial infarct. Parenchymal volume is normal. The ventricles are normal in size. There is [name] age-indeterminate small infarct in the right basal ganglia/corona radiata. The pituitary and suprasellar region are normal. There is no mass lesion. There is no mass effect or midline shift. Vascular: No hyperdense vessel or unexpected calcification. Skull: There is osseous metastatic disease in the clivus and bilateral sphenoid bones. Other: The mastoid air cells and middle ear cavities are clear. CT MAXILLOFACIAL FINDINGS Osseous: There is no  acute facial bone fracture. There is no evidence of mandibular dislocation. There is abnormal sclerosis in the bilateral sphenoid bones consistent with osseous metastatic disease. No lytic destructive lesion is seen in the facial bones. Orbits: The globes and orbits are unremarkable. Sinuses: There is a small retention cyst in the left maxillary sinus. Soft tissues: Unremarkable. CT CERVICAL SPINE FINDINGS Alignment: There is mild anterolisthesis of C3 on C4 and retrolisthesis of C4 on C5, likely degenerative. There is no evidence of traumatic malalignment. Skull base and vertebrae: Skull base alignment is maintained. Vertebral body heights are preserved. There is no evidence of acute fracture. There is extensive sclerotic osseous metastatic disease. Soft tissues and spinal canal: No prevertebral fluid or swelling. No visible canal hematoma. Disc levels: There is moderate disc space narrowing and degenerative endplate change at C4-C5 through C6-C7 and moderate left facet arthropathy at C3-C4 there is no evidence of high-grade spinal canal stenosis. Upper chest: The imaged lung apices are clear. A right chest wall port is partially imaged. Other: None. IMPRESSION: 1. No acute intracranial hemorrhage or calvarial fracture. 2. 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 3. No acute facial bone fracture. 4. No acute fracture or traumatic malalignment of the cervical spine. 5. Extensive sclerotic osseous metastatic disease. Electronically Signed  By: Lesia Hausen M.D.   On: 12/04/2022 17:12   CT CERVICAL SPINE WO CONTRAST  Result Date: 12/04/2022 CLINICAL DATA:  Trauma, pain EXAM: CT HEAD WITHOUT CONTRAST CT MAXILLOFACIAL WITHOUT CONTRAST CT CERVICAL SPINE WITHOUT CONTRAST TECHNIQUE: Multidetector CT imaging of the head, cervical spine, and maxillofacial structures were performed using the standard protocol without intravenous contrast. Multiplanar CT image reconstructions of the  cervical spine and maxillofacial structures were also generated. RADIATION DOSE REDUCTION: This exam was performed according to the departmental dose-optimization program which includes automated exposure control, adjustment of the mA and/or kV according to patient size and/or use of iterative reconstruction technique. COMPARISON:  None Available. FINDINGS: CT HEAD FINDINGS Brain: There is no acute intracranial hemorrhage, extra-axial fluid collection, or acute territorial infarct. Parenchymal volume is normal. The ventricles are normal in size. There is [name] age-indeterminate small infarct in the right basal ganglia/corona radiata. The pituitary and suprasellar region are normal. There is no mass lesion. There is no mass effect or midline shift. Vascular: No hyperdense vessel or unexpected calcification. Skull: There is osseous metastatic disease in the clivus and bilateral sphenoid bones. Other: The mastoid air cells and middle ear cavities are clear. CT MAXILLOFACIAL FINDINGS Osseous: There is no acute facial bone fracture. There is no evidence of mandibular dislocation. There is abnormal sclerosis in the bilateral sphenoid bones consistent with osseous metastatic disease. No lytic destructive lesion is seen in the facial bones. Orbits: The globes and orbits are unremarkable. Sinuses: There is a small retention cyst in the left maxillary sinus. Soft tissues: Unremarkable. CT CERVICAL SPINE FINDINGS Alignment: There is mild anterolisthesis of C3 on C4 and retrolisthesis of C4 on C5, likely degenerative. There is no evidence of traumatic malalignment. Skull base and vertebrae: Skull base alignment is maintained. Vertebral body heights are preserved. There is no evidence of acute fracture. There is extensive sclerotic osseous metastatic disease. Soft tissues and spinal canal: No prevertebral fluid or swelling. No visible canal hematoma. Disc levels: There is moderate disc space narrowing and degenerative endplate  change at C4-C5 through C6-C7 and moderate left facet arthropathy at C3-C4 there is no evidence of high-grade spinal canal stenosis. Upper chest: The imaged lung apices are clear. A right chest wall port is partially imaged. Other: None. IMPRESSION: 1. No acute intracranial hemorrhage or calvarial fracture. 2. 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 3. No acute facial bone fracture. 4. No acute fracture or traumatic malalignment of the cervical spine. 5. Extensive sclerotic osseous metastatic disease. Electronically Signed   By: Lesia Hausen M.D.   On: 12/04/2022 17:12   CT Maxillofacial Wo Contrast  Result Date: 12/04/2022 CLINICAL DATA:  Trauma, pain EXAM: CT HEAD WITHOUT CONTRAST CT MAXILLOFACIAL WITHOUT CONTRAST CT CERVICAL SPINE WITHOUT CONTRAST TECHNIQUE: Multidetector CT imaging of the head, cervical spine, and maxillofacial structures were performed using the standard protocol without intravenous contrast. Multiplanar CT image reconstructions of the cervical spine and maxillofacial structures were also generated. RADIATION DOSE REDUCTION: This exam was performed according to the departmental dose-optimization program which includes automated exposure control, adjustment of the mA and/or kV according to patient size and/or use of iterative reconstruction technique. COMPARISON:  None Available. FINDINGS: CT HEAD FINDINGS Brain: There is no acute intracranial hemorrhage, extra-axial fluid collection, or acute territorial infarct. Parenchymal volume is normal. The ventricles are normal in size. There is [name] age-indeterminate small infarct in the right basal ganglia/corona radiata. The pituitary and suprasellar region are normal. There is no mass lesion.  There is no mass effect or midline shift. Vascular: No hyperdense vessel or unexpected calcification. Skull: There is osseous metastatic disease in the clivus and bilateral sphenoid bones. Other: The mastoid air cells and  middle ear cavities are clear. CT MAXILLOFACIAL FINDINGS Osseous: There is no acute facial bone fracture. There is no evidence of mandibular dislocation. There is abnormal sclerosis in the bilateral sphenoid bones consistent with osseous metastatic disease. No lytic destructive lesion is seen in the facial bones. Orbits: The globes and orbits are unremarkable. Sinuses: There is a small retention cyst in the left maxillary sinus. Soft tissues: Unremarkable. CT CERVICAL SPINE FINDINGS Alignment: There is mild anterolisthesis of C3 on C4 and retrolisthesis of C4 on C5, likely degenerative. There is no evidence of traumatic malalignment. Skull base and vertebrae: Skull base alignment is maintained. Vertebral body heights are preserved. There is no evidence of acute fracture. There is extensive sclerotic osseous metastatic disease. Soft tissues and spinal canal: No prevertebral fluid or swelling. No visible canal hematoma. Disc levels: There is moderate disc space narrowing and degenerative endplate change at C4-C5 through C6-C7 and moderate left facet arthropathy at C3-C4 there is no evidence of high-grade spinal canal stenosis. Upper chest: The imaged lung apices are clear. A right chest wall port is partially imaged. Other: None. IMPRESSION: 1. No acute intracranial hemorrhage or calvarial fracture. 2. 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 3. No acute facial bone fracture. 4. No acute fracture or traumatic malalignment of the cervical spine. 5. Extensive sclerotic osseous metastatic disease. Electronically Signed   By: Lesia Hausen M.D.   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   I have him scheduled for cystoscopy with TURP of the recurrent tumor next week and reviewed the risks of bleeding, infection, injury to adjacent structures, incontinence, thrombotic  events and anesthetic risks.   Nodular prostate with obstruction.  Re-resection scheduled as noted above.Marland Kitchen     History of Left ureteral obstruction.   He has no left flank pain.  Recent CT shows no obstruction.   Continue Luprolide 45mg  q77mo and dutasteride.  He Scott [name] need his next Lupron at his postop visit.   The estradiol is managed by Dr. Ellin Saba    No orders of the defined types were placed in this encounter.    No orders of the defined types were placed in this encounter.      Return in about 1 month (around 01/12/2023) for reschedule Lupron for the 2-3 week post op visit.  Can see Maralyn Sago if I'm full. .   CC: Etta Grandchild, MD, Dr. Doreatha Massed.     Bjorn Pippin 12/15/2022 Patient ID: Scott [name], male   DOB: December 06, 1955, 67 y.o.   MRN: [id number]

## 2022-12-13 NOTE — Progress Notes (Signed)
Subjective:  12/13/22: Scott [name] returns today in f/u.  He was in the ER 9 days ago with difficulty voiding.  With nocturia 30-40x.  He had no hematuria.  He had dysuria and ankle edema.  His UA was negative but he had some benefit from pyridium.  He is currently on Pluvicto in addition to his ADT with Leuprolide, dutasteride and estradiol patches.  He is on tramadol and hydrocodone.  His IPSS is 19 with urgency and nocturia.  He had a CT that shows probable regrowth at the bladder neck.  He has no SUI but occasional UUI.  His PSA has fallen from >1500 to 734 with 2 rounds of Pluvicto.   .   06/14/22: Scott [name] returns today in f/u.  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  He had radation to spinal mets with the most recent at L4  in 1/24.  His PSA was 53.59 in 2/24 and was 44.4 on 05/30/22. He remains on dutasteride and estradiol patches.  He has discussed pluvicto but hasn't initiated that.  He has had germline and somatic testing with no actionable mutations per his report.   He has some increased LUTS but his IPSS is 6.  He has nocturia x 1-2 and occasional urgency with UUI.  He has had  12/07/21: Scott [name] returns today in f/u for leuprolide for his history of CRCP.  His PSA has continued to rise and was 3.58 in 6/23 and is now 9.84.  He last saw Dr. Ellin Saba on 11/01/21 and was switched to Darolutamide from abiaterone on 10/17/21.   He remains on dutasteride and estradiol patches as well. He had a PSMA PET on 09/28/21 and was noted to have tumor of the prostate gland and SV's and mets at L1 and L2.  There was no hydro noted.   He is on calcium and Vit D.  He had some increased voiding symptoms after starting the darolutamide but that has improved.  His IPSS is 7.  He has some low back and hip pain.  He is starting to losing weight after he gained on the prednisone.   He has discussed Pluvicto and is being considered for that.     06/01/21: Scott [name] returns today for Lupron.  His PSA is slowly rising and was  1.54 earlier this month.  He has fatigue and SOB. He has no further bone pain.   He has a stable weight. He is voiding well with an IPSS of 5-6 with some nocturia 1-2x.   He has had no hematuria and the UA is clear.  He continues to use estradiol and abiaterone with pred.  His hot flashes are well controlled with the estradiol.   He is on no bone targeting agents and is reluctant to add them because of dental issues.  He has started a calcium supplement.   He saw Dr. Ellin Saba on 05/23/21.   12/01/20: Scott [name] returns today in f/u for Lupron.  He has been dealing with shingles on the left flank and upper leg for the last 3-4 weeks but that is improving.  His last PSA in 8/22 was down to 0.89.  He started Zytiga in 4/22 and had lumbar SBRT in 5/22.   He has done well with that and his bone pain has improved.  He is voiding well with an IPSS of 2-3.  His Alk phos has normalized.    06/02/20: Scott [name] returns in f/u for his history of CRCP.  His PSA has been  rising despite 6 rounds of chemo.   He is seeing Dr. Karilyn Cota and is on oral estradiol 1mg  po bid but the hot flashes have returned.   He remains on dutasteride and Lupron and his T is  <3 and PSA is up to 12.9 on 05/26/20 from 4.77 on 04/28/20.  He is voiding well and his Cr was 0.8 on 2/10.  He has back pain that improved with chemo and prednisone but it has recurred.   His bone scan on 01/14/20 demonstrated stable L2 mets.  The CT showed improved left hydro with minimal residual bilateral hydro.  He is due for Lupron.  He is voiding well.  His IPSS is 6.  He has a tooth that needs extraction.     12/04/19: Scott [name] returns today in f/u from a TURP on 11/10/19 with resection of both UO's for malignant obstruction.   He is voiding better but has some frequency and nocturia 2-4x.  He has some left flank pain with voiding and probably has reflux.   His IPSS is 14.   He has had some hematuria but is improving.   He has some bone pain with the fulphila given for bone  marrow support.   He is otherwise tolerating chemo after one dose.  His Cr is down to 0.9 from 1.1 prior to the resection.   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 CT with bilateral non-bulky iliac adenopathy and tiny uptake Lt orbit (no spine mets) by bone scan.  His PSA is up to 15.5 from  9.47 at last check and a doubling over the last year. The testosterone remains castrate at <3 on Lupron and dutasteride. He remains on estradiol 0.1mg  patches for the hot flashes.   He had a telehealth visit with Dr. Kathrynn Running for consideration of radiation therapy to the primary and pelvic nodes from the Axumin PET findings on 12/11/18 but decided against that.  He is seeing Dr. Ellin Saba as well but is  reluctant to consider Zytiga because of the prednisone.   He has had no recent hematuria.  He has occasional frequency with urgency and UUI.  He wears a pad. He has a reduced stream.  He has nocturia 3-4x but he is drinking a lot of fluids.   His IPSS is 29-30.   Present Management:  01/2017 begin Lupron androgen deprivation Q74mo (declined reccomended orchiectomy)  04/2017 - PSA 2.86;  07/2017 PSA/T 3.44 / T 3 ==> Lupron 45mg .  10/08/17 PSA 2.1.  02/07/18 PSA 2.19/T 4. added Estradiol 0.1mg  patches twice weekly for the hot flashes.  03/10/18 PSA 1.87/T<3.  05/09/18 PSA 2.09/T10/ Est 32.  07/24/18 PSA 6/T 54  08/08/18 Lupron 45mg .  09/08/18 PSA 4.37  10/22/18 PSA 4.64/T<3.  11/26/18 Bone scan negative/ CT C/A/P negative. 12/11/18 Axumin PET small equivocal periaortic and left ext iliac nodes and right prostate activity.  I have discussed that with him.  11/28/18 PSA 6.46. 02/10/19 PSA 7.31/T <3. 03/19/19 PSA 8.20 04/11/19  Lupron 45mg  given.  04/23/19 PSA 9.96. 06/23/19 PSA 9.47. 09/28/19 PSA 15.56, T <3.  10/09/19 Lupron 45mg  given 11/10/19  Operative cysto / TURP 2018 with  inviltrative bladder neck / prostate mass ( left UO purposefully resected).  11/16/19 PSA 20.69.   11/06/19 Scott [name] has a mass at the base of the bladder consistent  with local invasion from his CRCP and he has left ureteral obstruction with a minor increase in his Cr.  He has seen Dr. Ellin Saba and is scheduled to begin chemotherapy but it was felt that decompression of the left kidney was indicated.   He is have increased voiding difficulty with passage of clots as well.    Results for Scott [name], SAFARIAN (MRN [id number]) as of 07/10/2019 08:56  Ref. Range 11/28/2018 10:23 02/10/2019 13:25 03/19/2019 13:49 04/23/2019 13:58 06/23/2019 13:41  Prostatic Specific Antigen Latest Ref Range: 0.00 - 4.00 ng/mL 6.46 (H) 7.31 (H) 8.20 (H) 9.96 (H) 9.47 (H)        ROS:  ROS:  A complete review of systems was performed.  All systems are negative except for pertinent findings as noted.   Review of Systems  Constitutional:  Negative for weight loss.  Respiratory:  Positive for shortness of breath.   Musculoskeletal:        Bone pain in pelvis, sacrum and both hips.   Neurological:  Positive for weakness.    Allergies  Allergen Reactions   Crestor [Rosuvastatin] Other (See Comments)    myalgia   Ivp Dye [Iodinated Contrast Media] Swelling    Swelling on head, took Benadryl and swelling reduced   Wellbutrin [Bupropion] Hives    Outpatient Encounter Medications as of 12/13/2022  Medication Sig   Cholecalciferol 125 MCG (5000 UT) capsule Take 10,000 Units by mouth daily.   dexamethasone (DECADRON) 4 MG tablet Take 1 tablet (4 mg total) by mouth daily. Take for three days after each Pluvicto treatment   docusate sodium (COLACE) 100 MG capsule Take 100 mg by mouth daily.   dronabinol (MARINOL) 5 MG capsule TAKE 1 CAPSULE(5 MG) BY MOUTH TWICE DAILY BEFORE A MEAL   dutasteride (AVODART) 0.5 MG capsule TAKE 1 CAPSULE(0.5 MG) BY MOUTH DAILY   Elastic Bandages & Supports (B-4 MED COMPRESSION HOSE MENS) MISC Wear  as needed when awake to help prevent leg swelling   estradiol (VIVELLE-DOT) 0.1 MG/24HR patch APPLY 1 PATCH(0.1 MG) TOPICALLY TO THE SKIN 2 TIMES A WEEK   HYDROcodone-acetaminophen (NORCO) 5-325 MG tablet Take 1 tablet by mouth every 6 (six) hours as needed for moderate pain.   MAGNESIUM PO Take 1 tablet by mouth at bedtime.   megestrol (MEGACE) 40 MG/ML suspension SHAKE LIQUID AND TAKE 10 ML(400 MG) BY MOUTH TWICE DAILY   MELATONIN PO Take 40 mg by mouth at bedtime.   metoprolol tartrate (LOPRESSOR) 25 MG tablet Take 0.5 tablets (12.5 mg total) by mouth 2 (two) times daily.   Multiple Vitamin (MULTIVITAMIN WITH MINERALS) TABS tablet Take 1 tablet by mouth daily.   NATTOKINASE PO Take 2 capsules by mouth daily.   NP THYROID 120 MG tablet Take 0.5 tablets (60 mg total) by mouth every morning.   olmesartan (BENICAR) 20 MG tablet TAKE 1 TABLET(20 MG) BY MOUTH DAILY   ondansetron (ZOFRAN ODT) 8 MG disintegrating tablet Take 1 tablet (8 mg total) by mouth every 8 (eight) hours as needed for nausea or vomiting.   phenazopyridine (PYRIDIUM) 200 MG tablet Take 1 tablet (200 mg total) by mouth 3 (three) times daily.   prochlorperazine (COMPAZINE) 10 MG tablet Take 10 mg by mouth daily.   prochlorperazine (COMPAZINE) 25 MG suppository Place 1 suppository (25 mg total) rectally every 8 (eight) hours as needed for nausea or vomiting.   pyridOXINE (VITAMIN B6) 25 MG tablet Take 25 mg by mouth daily.   thiamine (VITAMIN B-1) 50 MG tablet  Take 1 tablet (50 mg total) by mouth daily.   traMADol (ULTRAM) 50 MG tablet Take 2 tablets (100 mg total) by mouth every 6 (six) hours as needed.   vitamin E 180 MG (400 UNITS) capsule Take 400 Units by mouth daily.   VITAMIN K PO Take 1 capsule by mouth daily.   No facility-administered encounter medications on file as of 12/13/2022.    Past Medical History:  Diagnosis Date   Anemia    Bladder obstruction    Complication of anesthesia    Diverticulitis 2015    Dyspnea    Essential hypertension, benign 01/26/2019   at doctor's office elevated, normal at home, no medications at this time   GERD (gastroesophageal reflux disease)    History of basal cell cancer    HLD (hyperlipidemia) 01/26/2019   Hypothyroidism    history of, not currently taking medication   PONV (postoperative nausea and vomiting)    Prostate cancer (HCC)    metastatic to bones   Vitamin D deficiency disease 01/26/2019    Past Surgical History:  Procedure Laterality Date   CYSTOSCOPY W/ URETERAL STENT PLACEMENT Left 11/10/2019   Procedure: CYSTOSCOPY;  Surgeon: Bjorn Pippin, MD;  Location: [facility];  Service: Urology;  Laterality: Left;   ESOPHAGOGASTRODUODENOSCOPY (EGD) WITH PROPOFOL N/A 07/07/2020   Procedure: ESOPHAGOGASTRODUODENOSCOPY (EGD) WITH PROPOFOL;  Surgeon: Corbin Ade, MD;  Location: [facility];  Service: Endoscopy;  Laterality: N/A;  pm appt   HERNIA REPAIR  1995   UMBILICAL    IR FLUORO GUIDED NEEDLE PLC ASPIRATION/INJECTION LOC  04/09/2022   IR IMAGING GUIDED PORT INSERTION  10/11/2022   MALONEY DILATION N/A 07/07/2020   Procedure: Elease Hashimoto DILATION;  Surgeon: Corbin Ade, MD;  Location: [facility];  Service: Endoscopy;  Laterality: N/A;   PROSTATE SURGERY     SKIN CANCER EXCISION     TONSILLECTOMY Bilateral    TOOTH EXTRACTION     front left tooth pulled but no surgery or stitches   TRANSURETHRAL RESECTION OF PROSTATE N/A 01/25/2017   Procedure: TRANSURETHRAL RESECTION OF THE PROSTATE (TURP);  Surgeon: Sebastian Ache, MD;  Location: [facility];  Service: Urology;  Laterality: N/A;   TRANSURETHRAL RESECTION OF PROSTATE N/A 11/10/2019   Procedure: TRANSURETHRAL RESECTION OF THE PROSTATE (TURP);  Surgeon: Bjorn Pippin, MD;  Location: [facility];  Service: Urology;  Laterality: N/A;   WISDOM TOOTH EXTRACTION      Social History   Socioeconomic History   Marital status: Married    Spouse name: Not on file   Number of children: 1   Years of education: Not  on file   Highest education level: Bachelor's degree (e.g., BA, AB, BS)  Occupational History    Comment: working full time  Tobacco Use   Smoking status: Former    Current packs/day: 0.00    Average packs/day: 1.5 packs/day for 35.0 years (52.5 ttl pk-yrs)    Types: Cigarettes    Start date: 01/11/1969    Quit date: 01/12/2004    Years since quitting: 18.9   Smokeless tobacco: Never   Tobacco comments:    OVER 30 YEARS HX OF SMOKING   Vaping Use   Vaping status: Never Used  Substance and Sexual Activity   Alcohol use: Not Currently    Comment: heavy drinker until 2004   Drug use: Not Currently    Types: Marijuana   Sexual activity: Not Currently  Other Topics Concern   Not on file  Social History  Narrative   Married for 19 years.Works for [employer] from home.   Social Determinants of Health   Financial Resource Strain: Low Risk  (08/24/2022)   Overall Financial Resource Strain (CARDIA)    Difficulty of Paying Living Expenses: Not very hard  Food Insecurity: No Food Insecurity (08/24/2022)   Hunger Vital Sign    Worried About Running Out of Food in the Last Year: Never true    Ran Out of Food in the Last Year: Never true  Transportation Needs: No Transportation Needs (08/24/2022)   PRAPARE - Administrator, Civil Service (Medical): No    Lack of Transportation (Non-Medical): No  Physical Activity: Insufficiently Active (08/24/2022)   Exercise Vital Sign    Days of Exercise per Week: 5 days    Minutes of Exercise per Session: 20 min  Stress: Stress Concern Present (08/24/2022)   [employer] of Occupational Health - Occupational Stress Questionnaire    Feeling of Stress : To some extent  Social Connections: Socially Isolated (08/24/2022)   Social Connection and Isolation Panel [NHANES]    Frequency of Communication with Friends and Family: Once a week    Frequency of Social Gatherings with Friends and Family: Once a week    Attends Religious Services:  Never    Database administrator or Organizations: No    Attends Engineer, structural: Not on file    Marital Status: Married  Catering manager Violence: Not At Risk (03/02/2020)   Humiliation, Afraid, Rape, and Kick questionnaire    Fear of Current or Ex-Partner: No    Emotionally Abused: No    Physically Abused: No    Sexually Abused: No    Family History  Problem Relation Age of Onset   Emphysema Mother    Alzheimer's disease Father    Prostate cancer Father        dx in his 40s   Prostate cancer Brother        dx in his 56s   Breast cancer Maternal Grandmother        60s   Pancreatic cancer Maternal Grandfather        71s   Dementia Paternal Grandmother    Heart attack Paternal Grandfather    Colon cancer Neg Hx        Objective: Vitals:   12/13/22 0927  BP: (!) 157/82  Pulse: 74     Physical Exam Vitals reviewed.  Constitutional:      Appearance: He is ill-appearing.  Cardiovascular:     Rate and Rhythm: Normal rate and regular rhythm.     Heart sounds: Normal heart sounds.  Pulmonary:     Effort: Pulmonary effort is normal.     Breath sounds: Normal breath sounds.  Neurological:     Mental Status: He is alert.   I  Recent Results (from the past 2160 hour(s))  CBC with Differential     Status: Abnormal   Collection Time: 09/19/22  1:21 PM  Result Value Ref Range   WBC 5.4 4.0 - 10.5 K/uL   RBC 3.81 (L) 4.22 - 5.81 MIL/uL   Hemoglobin 11.6 (L) 13.0 - 17.0 g/dL   HCT 57.8 (L) 46.9 - 62.9 %   MCV 90.3 80.0 - 100.0 fL   MCH 30.4 26.0 - 34.0 pg   MCHC 33.7 30.0 - 36.0 g/dL   RDW 52.8 41.3 - 24.4 %   Platelets 230 150 - 400 K/uL   nRBC 0.0 0.0 - 0.2 %  Neutrophils Relative % 75 %   Neutro Abs 4.1 1.7 - 7.7 K/uL   Lymphocytes Relative 15 %   Lymphs Abs 0.8 0.7 - 4.0 K/uL   Monocytes Relative 8 %   Monocytes Absolute 0.4 0.1 - 1.0 K/uL   Eosinophils Relative 1 %   Eosinophils Absolute 0.1 0.0 - 0.5 K/uL   Basophils Relative 1 %    Basophils Absolute 0.1 0.0 - 0.1 K/uL   Immature Granulocytes 0 %   Abs Immature Granulocytes 0.02 0.00 - 0.07 K/uL    Comment: Performed at [facility], [address], [place], [place] [address]  Comprehensive metabolic panel     Status: Abnormal   Collection Time: 09/19/22  1:21 PM  Result Value Ref Range   Sodium 129 (L) 135 - 145 mmol/L   Potassium 4.0 3.5 - 5.1 mmol/L   Chloride 98 98 - 111 mmol/L   CO2 22 22 - 32 mmol/L   Glucose, Bld 118 (H) 70 - 99 mg/dL    Comment: Glucose reference range applies only to samples taken after fasting for at least 8 hours.   BUN 15 8 - 23 mg/dL   Creatinine, Ser 6.04 0.61 - 1.24 mg/dL   Calcium 9.0 8.9 - 54.0 mg/dL   Total Protein 6.6 6.5 - 8.1 g/dL   Albumin 3.8 3.5 - 5.0 g/dL   AST 25 15 - 41 U/L   ALT 13 0 - 44 U/L   Alkaline Phosphatase 519 (H) 38 - 126 U/L   Total Bilirubin 0.8 0.3 - 1.2 mg/dL   GFR, Estimated >98 >11 mL/min    Comment: (NOTE) Calculated using the CKD-EPI Creatinine Equation (2021)    Anion gap 9 5 - 15    Comment: Performed at [facility], [address] [place], [place] [address]  PSA     Status: Abnormal   Collection Time: 09/19/22  1:21 PM  Result Value Ref Range   Prostatic Specific Antigen 285.64 (H) 0.00 - 4.00 ng/mL    Comment: (NOTE) While PSA levels of <=4.00 ng/ml are reported as reference range, some men with levels below 4.00 ng/ml can have prostate cancer and many men with PSA above 4.00 ng/ml do not have prostate cancer.  Other tests such as free PSA, age specific reference ranges, PSA velocity and PSA doubling time may be helpful especially in men less than 25 years old. Performed at [facility], [address], [place], [place] [address]   CBC with Differential     Status: Abnormal   Collection Time: 09/28/22  9:25 AM  Result Value Ref Range   WBC 5.9 4.0 - 10.5 K/uL   RBC 4.12 (L) 4.22 - 5.81 MIL/uL   Hemoglobin 12.8 (L) 13.0 - 17.0 g/dL   HCT 13.0 (L) 86.5 - 78.4 %   MCV 89.1  80.0 - 100.0 fL   MCH 31.1 26.0 - 34.0 pg   MCHC 34.9 30.0 - 36.0 g/dL   RDW 69.6 29.5 - 28.4 %   Platelets 218 150 - 400 K/uL   nRBC 0.0 0.0 - 0.2 %   Neutrophils Relative % 78 %   Neutro Abs 4.6 1.7 - 7.7 K/uL   Lymphocytes Relative 11 %   Lymphs Abs 0.7 0.7 - 4.0 K/uL   Monocytes Relative 8 %   Monocytes Absolute 0.5 0.1 - 1.0 K/uL   Eosinophils Relative 1 %   Eosinophils Absolute 0.0 0.0 - 0.5 K/uL   Basophils Relative 1 %  Basophils Absolute 0.0 0.0 - 0.1 K/uL   Immature Granulocytes 1 %   Abs Immature Granulocytes 0.04 0.00 - 0.07 K/uL    Comment: Performed at [facility], [address], [place], [place] [address]  Comprehensive metabolic panel     Status: Abnormal   Collection Time: 09/28/22  9:25 AM  Result Value Ref Range   Sodium 129 (L) 135 - 145 mmol/L   Potassium 3.3 (L) 3.5 - 5.1 mmol/L   Chloride 96 (L) 98 - 111 mmol/L   CO2 21 (L) 22 - 32 mmol/L   Glucose, Bld 105 (H) 70 - 99 mg/dL    Comment: Glucose reference range applies only to samples taken after fasting for at least 8 hours.   BUN 12 8 - 23 mg/dL   Creatinine, Ser 6.21 0.61 - 1.24 mg/dL   Calcium 9.0 8.9 - 30.8 mg/dL   Total Protein 6.9 6.5 - 8.1 g/dL   Albumin 3.9 3.5 - 5.0 g/dL   AST 37 15 - 41 U/L   ALT 18 0 - 44 U/L   Alkaline Phosphatase 937 (H) 38 - 126 U/L   Total Bilirubin 1.0 0.3 - 1.2 mg/dL   GFR, Estimated >65 >78 mL/min    Comment: (NOTE) Calculated using the CKD-EPI Creatinine Equation (2021)    Anion gap 12 5 - 15    Comment: Performed at [facility], [address] [place], [place] [address]  PSA     Status: Abnormal   Collection Time: 09/28/22  9:25 AM  Result Value Ref Range   Prostatic Specific Antigen 520.98 (H) 0.00 - 4.00 ng/mL    Comment: (NOTE) While PSA levels of <=4.00 ng/ml are reported as reference range, some men with levels below 4.00 ng/ml can have prostate cancer and many men with PSA above 4.00 ng/ml do not have prostate cancer.  Other tests such as free PSA,  age specific reference ranges, PSA velocity and PSA doubling time may be helpful especially in men less than 54 years old. Performed at [facility], [address], [place], [place] [address]   Sample to Blood Bank(Blood Bank Hold)     Status: None   Collection Time: 10/09/22 11:13 AM  Result Value Ref Range   Blood Bank Specimen SAMPLE AVAILABLE FOR TESTING    Sample Expiration      10/10/2022,2359 Performed at [facility], [address], [place], [place] [address]   Magnesium     Status: None   Collection Time: 10/09/22 11:13 AM  Result Value Ref Range   Magnesium 2.1 1.7 - 2.4 mg/dL    Comment: Performed at [facility], [address] [place], [place] [address]  Comprehensive metabolic panel     Status: Abnormal   Collection Time: 10/09/22 11:13 AM  Result Value Ref Range   Sodium 127 (L) 135 - 145 mmol/L   Potassium 3.7 3.5 - 5.1 mmol/L   Chloride 94 (L) 98 - 111 mmol/L   CO2 20 (L) 22 - 32 mmol/L   Glucose, Bld 118 (H) 70 - 99 mg/dL    Comment: Glucose reference range applies only to samples taken after fasting for at least 8 hours.   BUN 13 8 - 23 mg/dL   Creatinine, Ser 2.53 0.61 - 1.24 mg/dL   Calcium 9.1 8.9 - 66.4 mg/dL   Total Protein 7.1 6.5 - 8.1 g/dL   Albumin 4.0 3.5 - 5.0 g/dL   AST 23 15 - 41 U/L   ALT  14 0 - 44 U/L   Alkaline Phosphatase 676 (H) 38 - 126 U/L   Total Bilirubin 1.0 0.3 - 1.2 mg/dL   GFR, Estimated >62 >13 mL/min    Comment: (NOTE) Calculated using the CKD-EPI Creatinine Equation (2021)    Anion gap 13 5 - 15    Comment: Performed at [facility], [address], [place], [place] [address]  CBC with Differential     Status: Abnormal   Collection Time: 10/09/22 11:13 AM  Result Value Ref Range   WBC 5.6 4.0 - 10.5 K/uL   RBC 4.25 4.22 - 5.81 MIL/uL   Hemoglobin 13.0 13.0 - 17.0 g/dL   HCT 84.6 (L) 96.2 - 95.2 %   MCV 88.5 80.0 - 100.0 fL   MCH 30.6 26.0 - 34.0 pg   MCHC 34.6 30.0 - 36.0 g/dL   RDW 84.1 32.4 - 40.1 %    Platelets 272 150 - 400 K/uL   nRBC 0.0 0.0 - 0.2 %   Neutrophils Relative % 75 %   Neutro Abs 4.2 1.7 - 7.7 K/uL   Lymphocytes Relative 11 %   Lymphs Abs 0.6 (L) 0.7 - 4.0 K/uL   Monocytes Relative 11 %   Monocytes Absolute 0.6 0.1 - 1.0 K/uL   Eosinophils Relative 1 %   Eosinophils Absolute 0.1 0.0 - 0.5 K/uL   Basophils Relative 1 %   Basophils Absolute 0.1 0.0 - 0.1 K/uL   Immature Granulocytes 1 %   Abs Immature Granulocytes 0.03 0.00 - 0.07 K/uL    Comment: Performed at [facility], [address] [place], [place] [address]  CBG monitoring, ED     Status: Abnormal   Collection Time: 10/22/22 12:17 PM  Result Value Ref Range   Glucose-Capillary 124 (H) 70 - 99 mg/dL    Comment: Glucose reference range applies only to samples taken after fasting for at least 8 hours.  Lipase, blood     Status: None   Collection Time: 10/22/22 12:42 PM  Result Value Ref Range   Lipase 27 11 - 51 U/L    Comment: Performed at [facility], [address] [place], [place] [address]  Comprehensive metabolic panel     Status: Abnormal   Collection Time: 10/22/22 12:42 PM  Result Value Ref Range   Sodium 124 (L) 135 - 145 mmol/L   Potassium 3.0 (L) 3.5 - 5.1 mmol/L   Chloride 93 (L) 98 - 111 mmol/L   CO2 21 (L) 22 - 32 mmol/L   Glucose, Bld 121 (H) 70 - 99 mg/dL    Comment: Glucose reference range applies only to samples taken after fasting for at least 8 hours.   BUN 11 8 - 23 mg/dL   Creatinine, Ser 0.34 0.61 - 1.24 mg/dL   Calcium 8.5 (L) 8.9 - 10.3 mg/dL   Total Protein 6.8 6.5 - 8.1 g/dL   Albumin 3.8 3.5 - 5.0 g/dL   AST 25 15 - 41 U/L   ALT 13 0 - 44 U/L   Alkaline Phosphatase 1,283 (H) 38 - 126 U/L   Total Bilirubin 1.6 (H) 0.3 - 1.2 mg/dL   GFR, Estimated >74 >25 mL/min    Comment: (NOTE) Calculated using the CKD-EPI Creatinine Equation (2021)    Anion gap 10 5 - 15    Comment: Performed at [facility], [address] [place], [place] [address]  CBC     Status: Abnormal    Collection Time: 10/22/22 12:42 PM  Result  Value Ref Range   WBC 6.6 4.0 - 10.5 K/uL   RBC 4.21 (L) 4.22 - 5.81 MIL/uL   Hemoglobin 13.0 13.0 - 17.0 g/dL   HCT 04.5 (L) 40.9 - 81.1 %   MCV 84.6 80.0 - 100.0 fL   MCH 30.9 26.0 - 34.0 pg   MCHC 36.5 (H) 30.0 - 36.0 g/dL   RDW 91.4 78.2 - 95.6 %   Platelets 235 150 - 400 K/uL   nRBC 0.0 0.0 - 0.2 %    Comment: Performed at [facility], [address] [place], [place] [address]  Magnesium     Status: None   Collection Time: 10/22/22 12:42 PM  Result Value Ref Range   Magnesium 1.9 1.7 - 2.4 mg/dL    Comment: Performed at [facility] [facility], [address], [place], [place] [address]  Urinalysis, Routine w reflex microscopic -Urine, Clean Catch     Status: Abnormal   Collection Time: 10/22/22  2:46 PM  Result Value Ref Range   Color, Urine YELLOW YELLOW   APPearance CLEAR CLEAR   Specific Gravity, Urine 1.006 1.005 - 1.030   pH 6.0 5.0 - 8.0   Glucose, UA NEGATIVE NEGATIVE mg/dL   Hgb urine dipstick NEGATIVE NEGATIVE   Bilirubin Urine NEGATIVE NEGATIVE   Ketones, ur 5 (A) NEGATIVE mg/dL   Protein, ur NEGATIVE NEGATIVE mg/dL   Nitrite NEGATIVE NEGATIVE   Leukocytes,Ua NEGATIVE NEGATIVE    Comment: Performed at [facility], [address], [place], [place] [address]  PSA     Status: Abnormal   Collection Time: 10/30/22  1:39 PM  Result Value Ref Range   Prostatic Specific Antigen >1,500.00 (H) 0.00 - 4.00 ng/mL    Comment: RESULT CONFIRMED BY AUTOMATED DILUTION (NOTE) While PSA levels of <=4.00 ng/ml are reported as reference range, some men with levels below 4.00 ng/ml can have prostate cancer and many men with PSA above 4.00 ng/ml do not have prostate cancer.  Other tests such as free PSA, age specific reference ranges, PSA velocity and PSA doubling time may be helpful especially in men less than 38 years old. Performed at [facility], [address] [place], [place] [address]   CBC with Differential/Platelet     Status:  Abnormal   Collection Time: 10/30/22  1:39 PM  Result Value Ref Range   WBC 5.5 4.0 - 10.5 K/uL   RBC 3.48 (L) 4.22 - 5.81 MIL/uL   Hemoglobin 10.6 (L) 13.0 - 17.0 g/dL   HCT 32.4 (L) 40.1 - 02.7 %   MCV 86.8 80.0 - 100.0 fL   MCH 30.5 26.0 - 34.0 pg   MCHC 35.1 30.0 - 36.0 g/dL   RDW 25.3 66.4 - 40.3 %   Platelets 221 150 - 400 K/uL   nRBC 0.0 0.0 - 0.2 %   Neutrophils Relative % 72 %   Neutro Abs 4.0 1.7 - 7.7 K/uL   Lymphocytes Relative 17 %   Lymphs Abs 0.9 0.7 - 4.0 K/uL   Monocytes Relative 9 %   Monocytes Absolute 0.5 0.1 - 1.0 K/uL   Eosinophils Relative 0 %   Eosinophils Absolute 0.0 0.0 - 0.5 K/uL   Basophils Relative 1 %   Basophils Absolute 0.0 0.0 - 0.1 K/uL   Immature Granulocytes 1 %   Abs Immature Granulocytes 0.03 0.00 - 0.07 K/uL    Comment: Performed at [facility], [address] [place], [place] [address]  Comprehensive metabolic panel  Status: Abnormal   Collection Time: 10/30/22  1:39 PM  Result Value Ref Range   Sodium 126 (L) 135 - 145 mmol/L   Potassium 3.5 3.5 - 5.1 mmol/L   Chloride 97 (L) 98 - 111 mmol/L   CO2 19 (L) 22 - 32 mmol/L   Glucose, Bld 104 (H) 70 - 99 mg/dL    Comment: Glucose reference range applies only to samples taken after fasting for at least 8 hours.   BUN 9 8 - 23 mg/dL   Creatinine, Ser 8.65 0.61 - 1.24 mg/dL   Calcium 8.6 (L) 8.9 - 10.3 mg/dL   Total Protein 6.0 (L) 6.5 - 8.1 g/dL   Albumin 3.2 (L) 3.5 - 5.0 g/dL   AST 27 15 - 41 U/L   ALT 17 0 - 44 U/L   Alkaline Phosphatase 890 (H) 38 - 126 U/L   Total Bilirubin 0.5 0.3 - 1.2 mg/dL   GFR, Estimated >78 >46 mL/min    Comment: (NOTE) Calculated using the CKD-EPI Creatinine Equation (2021)    Anion gap 10 5 - 15    Comment: Performed at [facility], [address] [place], [place] [address]  Magnesium     Status: None   Collection Time: 10/30/22  1:39 PM  Result Value Ref Range   Magnesium 2.3 1.7 - 2.4 mg/dL    Comment: Performed at [facility], [address], [place], [place] [address]  Urinalysis, Routine w reflex microscopic -Urine, Clean Catch     Status: Abnormal   Collection Time: 12/04/22  2:19 PM  Result Value Ref Range   Color, Urine YELLOW YELLOW   APPearance CLEAR CLEAR   Specific Gravity, Urine 1.023 1.005 - 1.030   pH 5.0 5.0 - 8.0   Glucose, UA NEGATIVE NEGATIVE mg/dL   Hgb urine dipstick NEGATIVE NEGATIVE   Bilirubin Urine NEGATIVE NEGATIVE   Ketones, ur NEGATIVE NEGATIVE mg/dL   Protein, ur 30 (A) NEGATIVE mg/dL   Nitrite NEGATIVE NEGATIVE   Leukocytes,Ua NEGATIVE NEGATIVE   RBC / HPF 0-5 0 - 5 RBC/hpf   WBC, UA 0-5 0 - 5 WBC/hpf   Bacteria, UA NONE SEEN NONE SEEN   Squamous Epithelial / HPF 0-5 0 - 5 /HPF   Mucus PRESENT     Comment: Performed at [facility], [address], [place], [place] [address]  Comprehensive metabolic panel     Status: Abnormal   Collection Time: 12/04/22  3:32 PM  Result Value Ref Range   Sodium 130 (L) 135 - 145 mmol/L   Potassium 3.9 3.5 - 5.1 mmol/L   Chloride 99 98 - 111 mmol/L   CO2 22 22 - 32 mmol/L   Glucose, Bld 99 70 - 99 mg/dL    Comment: Glucose reference range applies only to samples taken after fasting for at least 8 hours.   BUN 11 8 - 23 mg/dL   Creatinine, Ser 0.27 0.61 - 1.24 mg/dL   Calcium 8.2 (L) 8.9 - 10.3 mg/dL   Total Protein 6.6 6.5 - 8.1 g/dL   Albumin 3.4 (L) 3.5 - 5.0 g/dL   AST 26 15 - 41 U/L   ALT 47 (H) 0 - 44 U/L   Alkaline Phosphatase 913 (H) 38 - 126 U/L   Total Bilirubin 0.8 0.3 - 1.2 mg/dL   GFR, Estimated >25 >36 mL/min    Comment: (NOTE) Calculated using the CKD-EPI Creatinine Equation (2021)    Anion gap 9 5 - 15  Comment: Performed at [facility], [address], [place], [place] [address]  CBC with Differential     Status: Abnormal   Collection Time: 12/04/22  3:32 PM  Result Value Ref Range   WBC 5.7 4.0 - 10.5 K/uL   RBC 3.69 (L) 4.22 - 5.81 MIL/uL   Hemoglobin 11.5 (L) 13.0 - 17.0 g/dL   HCT 86.5 (L) 78.4 - 69.6 %   MCV 91.9 80.0  - 100.0 fL   MCH 31.2 26.0 - 34.0 pg   MCHC 33.9 30.0 - 36.0 g/dL   RDW 29.5 (H) 28.4 - 13.2 %   Platelets 289 150 - 400 K/uL   nRBC 0.0 0.0 - 0.2 %   Neutrophils Relative % 70 %   Neutro Abs 4.0 1.7 - 7.7 K/uL   Lymphocytes Relative 16 %   Lymphs Abs 0.9 0.7 - 4.0 K/uL   Monocytes Relative 10 %   Monocytes Absolute 0.6 0.1 - 1.0 K/uL   Eosinophils Relative 0 %   Eosinophils Absolute 0.0 0.0 - 0.5 K/uL   Basophils Relative 1 %   Basophils Absolute 0.1 0.0 - 0.1 K/uL   Immature Granulocytes 3 %   Abs Immature Granulocytes 0.14 (H) 0.00 - 0.07 K/uL    Comment: Performed at [facility], [address] [place], [place] [address]  Brain natriuretic peptide     Status: Abnormal   Collection Time: 12/04/22  3:32 PM  Result Value Ref Range   B Natriuretic Peptide 271.0 (H) 0.0 - 100.0 pg/mL    Comment: Performed at [facility], [address] [place], [place] [address]  Magnesium     Status: None   Collection Time: 12/06/22  2:24 PM  Result Value Ref Range   Magnesium 2.2 1.7 - 2.4 mg/dL    Comment: Performed at [facility], [address] [place], [place] [address]  CBC with Differential     Status: Abnormal   Collection Time: 12/06/22  2:24 PM  Result Value Ref Range   WBC 6.2 4.0 - 10.5 K/uL   RBC 3.16 (L) 4.22 - 5.81 MIL/uL   Hemoglobin 10.0 (L) 13.0 - 17.0 g/dL   HCT 34.7 (L) 42.5 - 95.6 %   MCV 92.4 80.0 - 100.0 fL   MCH 31.6 26.0 - 34.0 pg   MCHC 34.2 30.0 - 36.0 g/dL   RDW 38.7 (H) 56.4 - 33.2 %   Platelets 237 150 - 400 K/uL   nRBC 0.0 0.0 - 0.2 %   Neutrophils Relative % 81 %   Neutro Abs 5.1 1.7 - 7.7 K/uL   Lymphocytes Relative 10 %   Lymphs Abs 0.6 (L) 0.7 - 4.0 K/uL   Monocytes Relative 6 %   Monocytes Absolute 0.4 0.1 - 1.0 K/uL   Eosinophils Relative 1 %   Eosinophils Absolute 0.0 0.0 - 0.5 K/uL   Basophils Relative 0 %   Basophils Absolute 0.0 0.0 - 0.1 K/uL   Immature Granulocytes 2 %   Abs Immature Granulocytes 0.10 (H) 0.00 - 0.07 K/uL    Comment:  Performed at [facility], [address], [place], [place] [address]  Comprehensive metabolic panel     Status: Abnormal   Collection Time: 12/06/22  2:24 PM  Result Value Ref Range   Sodium 130 (L) 135 - 145 mmol/L   Potassium 4.1 3.5 - 5.1 mmol/L   Chloride 104 98 - 111 mmol/L   CO2 18 (L) 22 - 32 mmol/L   Glucose, Bld 99  70 - 99 mg/dL    Comment: Glucose reference range applies only to samples taken after fasting for at least 8 hours.   BUN 13 8 - 23 mg/dL   Creatinine, Ser 3.32 (L) 0.61 - 1.24 mg/dL   Calcium 8.0 (L) 8.9 - 10.3 mg/dL   Total Protein 5.9 (L) 6.5 - 8.1 g/dL   Albumin 3.1 (L) 3.5 - 5.0 g/dL   AST 26 15 - 41 U/L   ALT 37 0 - 44 U/L   Alkaline Phosphatase 741 (H) 38 - 126 U/L   Total Bilirubin 0.6 0.3 - 1.2 mg/dL   GFR, Estimated >95 >18 mL/min    Comment: (NOTE) Calculated using the CKD-EPI Creatinine Equation (2021)    Anion gap 8 5 - 15    Comment: Performed at [facility], [address], [place], [place] [address]  PSA     Status: Abnormal   Collection Time: 12/06/22  2:24 PM  Result Value Ref Range   Prostatic Specific Antigen 734.92 (H) 0.00 - 4.00 ng/mL    Comment: (NOTE) While PSA levels of <=4.00 ng/ml are reported as reference range, some men with levels below 4.00 ng/ml can have prostate cancer and many men with PSA above 4.00 ng/ml do not have prostate cancer.  Other tests such as free PSA, age specific reference ranges, PSA velocity and PSA doubling time may be helpful especially in men less than 57 years old. Performed at [facility], [address], [place], [place] [address]   Basic metabolic panel per protocol     Status: Abnormal   Collection Time: 12/11/22  2:50 PM  Result Value Ref Range   Sodium 132 (L) 135 - 145 mmol/L   Potassium 4.3 3.5 - 5.1 mmol/L   Chloride 104 98 - 111 mmol/L   CO2 21 (L) 22 - 32 mmol/L   Glucose, Bld 91 70 - 99 mg/dL    Comment: Glucose reference range applies only to samples taken after fasting for  at least 8 hours.   BUN 16 8 - 23 mg/dL   Creatinine, Ser 6.01 0.61 - 1.24 mg/dL   Calcium 8.5 (L) 8.9 - 10.3 mg/dL   GFR, Estimated >09 >32 mL/min    Comment: (NOTE) Calculated using the CKD-EPI Creatinine Equation (2021)    Anion gap 7 5 - 15    Comment: Performed at [facility], [address], [place], [place] [address]  CBC per protocol     Status: Abnormal   Collection Time: 12/11/22  2:50 PM  Result Value Ref Range   WBC 6.8 4.0 - 10.5 K/uL   RBC 3.24 (L) 4.22 - 5.81 MIL/uL   Hemoglobin 10.4 (L) 13.0 - 17.0 g/dL   HCT 22.0 (L) 25.4 - 27.0 %   MCV 96.9 80.0 - 100.0 fL   MCH 32.1 26.0 - 34.0 pg   MCHC 33.1 30.0 - 36.0 g/dL   RDW 62.3 (H) 76.2 - 83.1 %   Platelets 258 150 - 400 K/uL   nRBC 0.0 0.0 - 0.2 %    Comment: Performed at [facility], [address] [place], [place] [address]  Type and screen [facility]     Status: None   Collection Time: 12/11/22  2:50 PM  Result Value Ref Range   ABO/RH(D) AB POS    Antibody Screen NEG    Sample Expiration 12/25/2022,2359    Extend sample reason      NO  TRANSFUSIONS OR PREGNANCY IN THE PAST 3 MONTHS Performed at [facility], [address], [place], [place] [address]    I reviewed the records from his visits with Dr. Ellin Saba and his CT report.  MR PELVIS W WO CONTRAST  Result Date: 12/04/2022 CLINICAL DATA:  Bladder mass, status post TURP, history of prostate cancer EXAM: MRI PELVIS WITHOUT AND WITH CONTRAST TECHNIQUE: Multiplanar multisequence MR imaging of the pelvis was performed both before and after administration of intravenous contrast. CONTRAST:  7mL GADAVIST GADOBUTROL 1 MMOL/ML IV SOLN COMPARISON:  CT abdomen/pelvis dated 12/04/2022 FINDINGS: Urinary Tract: Bladder is mildly thick-walled although underdistended. Soft tissue along the bladder base, described below. Bowel:  Sigmoid diverticulosis, without evidence of diverticulitis.  Vascular/Lymphatic: No evidence of abdominal aortic aneurysm. No suspicious abdominal lymphadenopathy. 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 Additional enhancing tumor involving the base of the seminal vesicles (series 8/image 51). Other:  Trace presacral fluid. Musculoskeletal: Multifocal osseous metastases. 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 If there is concern for primary bladder carcinoma, consider cystoscopy. Additional enhancing tumor involving the base of the seminal vesicles. Multifocal osseous metastases. Electronically Signed   By: Charline Bills M.D.   On: 12/04/2022 20:14   CT ABDOMEN PELVIS WO CONTRAST  Result Date: 12/04/2022 CLINICAL DATA:  Bladder dysfunction. History of metastatic prostate cancer. Concern for bladder mass. * Tracking Code: BO * EXAM: CT ABDOMEN AND PELVIS WITHOUT CONTRAST TECHNIQUE: Multidetector CT imaging of the abdomen and pelvis was performed following the standard protocol without IV contrast. RADIATION DOSE REDUCTION: This exam was performed according to the departmental dose-optimization program which includes automated exposure control, adjustment of the mA and/or kV according to patient size and/or use of iterative reconstruction technique. COMPARISON:  CT 07/26/2022 FINDINGS: Lower chest: 4 mm subpleural left lower lobe lung nodule on image 40 of series 4. This measured 3 mm previously, similar. There is some bilateral lung base areas of atelectasis. Breathing motion. Stable 2-3 mm nodule subpleural right lower lobe series 4, image 17 as well. No pleural effusion. Gynecomastia. Hepatobiliary: Stones in the nondilated gallbladder. On this non IV contrast exam, no clear space-occupying liver lesion. Evaluation for  solid organ pathology is limited without the advantage of IV contrast. Pancreas: Global mild atrophy of the pancreas particularly the head neck and body region. Unchanged from previous. Spleen: Normal in size without focal abnormality. Adrenals/Urinary Tract: Punctate calcification along the right adrenal gland. Stable. Normal left adrenal. No abnormal calcification seen within either kidney nor along the course of either ureter. Slight ectasia of the distal ureters, nonspecific. Preserved contours of the urinary bladder with some wall thickening and stranding. Stomach/Bowel: There is fluid and debris along the stomach. Large bowel has a normal course and caliber with moderate colonic stool. Few distal colonic diverticula. The appendix is grossly preserved in the right lower quadrant and is nondilated. Small bowel is nondilated. Vascular/Lymphatic: Aortic atherosclerosis. No enlarged abdominal or pelvic lymph nodes. Reproductive: Lobular prostate with TURP defect and calcifications. Previously there was a enhancing nodule along the margin of the TURP defect inferiorly. This has less well defined without the advantage of IV contrast but the contour deformity is still present on series 2, image 74. Additional evaluation as clinically appropriate. MRI may be of some benefit as an option Other: Nonspecific  presacral fat stranding, increased from previous. Small bilateral fat containing inguinal hernias. Epigastric midline anterior abdominal wall moderate fat containing hernia. The fat in the hernia sac is with some stranding. This was seen previously. Musculoskeletal: Extensive sclerotic bone metastases again identified. Comparing current to prior the extent distribution of the areas is markedly increased throughout the pelvis, proximal femurs, spine and ribs. There is also sternal lesions. IMPRESSION: History of prostate neoplasm with marked increase in sclerotic bone metastases compared to the prior CT scan of May  2024. Nodular prostate with mass effect along the bladder. Stable bladder wall thickening and stranding. The nodular area in the area of the TURP defect of the prostate on the prior is suggested again today but less well seen without the advantage of IV contrast. Additional workup is recommended as clinically appropriate such as MRI. Increasing presacral fat stranding. Gallstones. Gynecomastia. Tiny lung nodules. Additional follow up as per the patient's neoplasm. Electronically Signed   By: Karen Kays M.D.   On: 12/04/2022 17:52   CT HEAD WO CONTRAST ( )  Result Date: 12/04/2022 CLINICAL DATA:  Trauma, pain EXAM: CT HEAD WITHOUT CONTRAST CT MAXILLOFACIAL WITHOUT CONTRAST CT CERVICAL SPINE WITHOUT CONTRAST TECHNIQUE: Multidetector CT imaging of the head, cervical spine, and maxillofacial structures were performed using the standard protocol without intravenous contrast. Multiplanar CT image reconstructions of the cervical spine and maxillofacial structures were also generated. RADIATION DOSE REDUCTION: This exam was performed according to the departmental dose-optimization program which includes automated exposure control, adjustment of the mA and/or kV according to patient size and/or use of iterative reconstruction technique. COMPARISON:  None Available. FINDINGS: CT HEAD FINDINGS Brain: There is no acute intracranial hemorrhage, extra-axial fluid collection, or acute territorial infarct. Parenchymal volume is normal. The ventricles are normal in size. There is an age-indeterminate small infarct in the right basal ganglia/corona radiata. The pituitary and suprasellar region are normal. There is no mass lesion. There is no mass effect or midline shift. Vascular: No hyperdense vessel or unexpected calcification. Skull: There is osseous metastatic disease in the clivus and bilateral sphenoid bones. Other: The mastoid air cells and middle ear cavities are clear. CT MAXILLOFACIAL FINDINGS Osseous: There is no  acute facial bone fracture. There is no evidence of mandibular dislocation. There is abnormal sclerosis in the bilateral sphenoid bones consistent with osseous metastatic disease. No lytic destructive lesion is seen in the facial bones. Orbits: The globes and orbits are unremarkable. Sinuses: There is a small retention cyst in the left maxillary sinus. Soft tissues: Unremarkable. CT CERVICAL SPINE FINDINGS Alignment: There is mild anterolisthesis of C3 on C4 and retrolisthesis of C4 on C5, likely degenerative. There is no evidence of traumatic malalignment. Skull base and vertebrae: Skull base alignment is maintained. Vertebral body heights are preserved. There is no evidence of acute fracture. There is extensive sclerotic osseous metastatic disease. Soft tissues and spinal canal: No prevertebral fluid or swelling. No visible canal hematoma. Disc levels: There is moderate disc space narrowing and degenerative endplate change at C4-C5 through C6-C7 and moderate left facet arthropathy at C3-C4 there is no evidence of high-grade spinal canal stenosis. Upper chest: The imaged lung apices are clear. A right chest wall port is partially imaged. Other: None. IMPRESSION: 1. No acute intracranial hemorrhage or calvarial fracture. 2. 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 3. No acute facial bone fracture. 4. No acute fracture or traumatic malalignment of the cervical spine. 5. Extensive sclerotic osseous metastatic disease. Electronically Signed  By: Lesia Hausen M.D.   On: 12/04/2022 17:12   CT CERVICAL SPINE WO CONTRAST  Result Date: 12/04/2022 CLINICAL DATA:  Trauma, pain EXAM: CT HEAD WITHOUT CONTRAST CT MAXILLOFACIAL WITHOUT CONTRAST CT CERVICAL SPINE WITHOUT CONTRAST TECHNIQUE: Multidetector CT imaging of the head, cervical spine, and maxillofacial structures were performed using the standard protocol without intravenous contrast. Multiplanar CT image reconstructions of the  cervical spine and maxillofacial structures were also generated. RADIATION DOSE REDUCTION: This exam was performed according to the departmental dose-optimization program which includes automated exposure control, adjustment of the mA and/or kV according to patient size and/or use of iterative reconstruction technique. COMPARISON:  None Available. FINDINGS: CT HEAD FINDINGS Brain: There is no acute intracranial hemorrhage, extra-axial fluid collection, or acute territorial infarct. Parenchymal volume is normal. The ventricles are normal in size. There is an age-indeterminate small infarct in the right basal ganglia/corona radiata. The pituitary and suprasellar region are normal. There is no mass lesion. There is no mass effect or midline shift. Vascular: No hyperdense vessel or unexpected calcification. Skull: There is osseous metastatic disease in the clivus and bilateral sphenoid bones. Other: The mastoid air cells and middle ear cavities are clear. CT MAXILLOFACIAL FINDINGS Osseous: There is no acute facial bone fracture. There is no evidence of mandibular dislocation. There is abnormal sclerosis in the bilateral sphenoid bones consistent with osseous metastatic disease. No lytic destructive lesion is seen in the facial bones. Orbits: The globes and orbits are unremarkable. Sinuses: There is a small retention cyst in the left maxillary sinus. Soft tissues: Unremarkable. CT CERVICAL SPINE FINDINGS Alignment: There is mild anterolisthesis of C3 on C4 and retrolisthesis of C4 on C5, likely degenerative. There is no evidence of traumatic malalignment. Skull base and vertebrae: Skull base alignment is maintained. Vertebral body heights are preserved. There is no evidence of acute fracture. There is extensive sclerotic osseous metastatic disease. Soft tissues and spinal canal: No prevertebral fluid or swelling. No visible canal hematoma. Disc levels: There is moderate disc space narrowing and degenerative endplate  change at C4-C5 through C6-C7 and moderate left facet arthropathy at C3-C4 there is no evidence of high-grade spinal canal stenosis. Upper chest: The imaged lung apices are clear. A right chest wall port is partially imaged. Other: None. IMPRESSION: 1. No acute intracranial hemorrhage or calvarial fracture. 2. 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 3. No acute facial bone fracture. 4. No acute fracture or traumatic malalignment of the cervical spine. 5. Extensive sclerotic osseous metastatic disease. Electronically Signed   By: Lesia Hausen M.D.   On: 12/04/2022 17:12   CT Maxillofacial Wo Contrast  Result Date: 12/04/2022 CLINICAL DATA:  Trauma, pain EXAM: CT HEAD WITHOUT CONTRAST CT MAXILLOFACIAL WITHOUT CONTRAST CT CERVICAL SPINE WITHOUT CONTRAST TECHNIQUE: Multidetector CT imaging of the head, cervical spine, and maxillofacial structures were performed using the standard protocol without intravenous contrast. Multiplanar CT image reconstructions of the cervical spine and maxillofacial structures were also generated. RADIATION DOSE REDUCTION: This exam was performed according to the departmental dose-optimization program which includes automated exposure control, adjustment of the mA and/or kV according to patient size and/or use of iterative reconstruction technique. COMPARISON:  None Available. FINDINGS: CT HEAD FINDINGS Brain: There is no acute intracranial hemorrhage, extra-axial fluid collection, or acute territorial infarct. Parenchymal volume is normal. The ventricles are normal in size. There is an age-indeterminate small infarct in the right basal ganglia/corona radiata. The pituitary and suprasellar region are normal. There is no mass lesion.  There is no mass effect or midline shift. Vascular: No hyperdense vessel or unexpected calcification. Skull: There is osseous metastatic disease in the clivus and bilateral sphenoid bones. Other: The mastoid air cells and  middle ear cavities are clear. CT MAXILLOFACIAL FINDINGS Osseous: There is no acute facial bone fracture. There is no evidence of mandibular dislocation. There is abnormal sclerosis in the bilateral sphenoid bones consistent with osseous metastatic disease. No lytic destructive lesion is seen in the facial bones. Orbits: The globes and orbits are unremarkable. Sinuses: There is a small retention cyst in the left maxillary sinus. Soft tissues: Unremarkable. CT CERVICAL SPINE FINDINGS Alignment: There is mild anterolisthesis of C3 on C4 and retrolisthesis of C4 on C5, likely degenerative. There is no evidence of traumatic malalignment. Skull base and vertebrae: Skull base alignment is maintained. Vertebral body heights are preserved. There is no evidence of acute fracture. There is extensive sclerotic osseous metastatic disease. Soft tissues and spinal canal: No prevertebral fluid or swelling. No visible canal hematoma. Disc levels: There is moderate disc space narrowing and degenerative endplate change at C4-C5 through C6-C7 and moderate left facet arthropathy at C3-C4 there is no evidence of high-grade spinal canal stenosis. Upper chest: The imaged lung apices are clear. A right chest wall port is partially imaged. Other: None. IMPRESSION: 1. No acute intracranial hemorrhage or calvarial fracture. 2. 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 3. No acute facial bone fracture. 4. No acute fracture or traumatic malalignment of the cervical spine. 5. Extensive sclerotic osseous metastatic disease. Electronically Signed   By: Lesia Hausen M.D.   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   I have him scheduled for cystoscopy with TURP of the recurrent tumor next week and reviewed the risks of bleeding, infection, injury to adjacent structures, incontinence, thrombotic  events and anesthetic risks.   Nodular prostate with obstruction.  Re-resection scheduled as noted above.Marland Kitchen     History of Left ureteral obstruction.   He has no left flank pain.  Recent CT shows no obstruction.   Continue Luprolide 45mg  q77mo and dutasteride.  He Scott [name] need his next Lupron at his postop visit.   The estradiol is managed by Dr. Ellin Saba    No orders of the defined types were placed in this encounter.    No orders of the defined types were placed in this encounter.      Return in about 1 month (around 01/12/2023) for reschedule Lupron for the 2-3 week post op visit.  Can see Maralyn Sago if I'm full. .   CC: Etta Grandchild, MD, Dr. Doreatha Massed.     Bjorn Pippin 12/15/2022 Patient ID: Scott [name], male   DOB: December 06, 1955, 67 y.o.   MRN: [id number]

## 2022-12-13 NOTE — Telephone Encounter (Signed)
Patient left a voice message 12-13-2022. Will need a "work" note for him to be out with surgery.  Unsure of how long he will need to be taken out.  Please advise.  Call:  [phone]

## 2022-12-17 ENCOUNTER — Other Ambulatory Visit: Payer: Self-pay | Admitting: *Deleted

## 2022-12-17 MED ORDER — TRAMADOL HCL 50 MG PO TABS: 100.0000 mg | ORAL_TABLET | Freq: Four times a day (QID) | ORAL | 0 refills | Status: DC | PRN

## 2022-12-17 NOTE — Anesthesia Preprocedure Evaluation (Signed)
Anesthesia Evaluation  Patient identified by MRN, date of birth, ID band Patient awake    Reviewed: Allergy & Precautions, NPO status , Patient's Chart, lab work & pertinent test results  History of Anesthesia Complications (+) PONV and history of anesthetic complications  Airway Mallampati: II  TM Distance: >3 FB Neck ROM: Full    Dental  (+) Upper Dentures   Pulmonary former smoker   Pulmonary exam normal        Cardiovascular hypertension, Pt. on medications and Pt. on home beta blockers  Rhythm:Regular Rate:Normal     Neuro/Psych negative neurological ROS  negative psych ROS   GI/Hepatic Neg liver ROS,GERD  ,,  Endo/Other  Hypothyroidism    Renal/GU negative Renal ROS Bladder dysfunction      Musculoskeletal negative musculoskeletal ROS (+)    Abdominal Normal abdominal exam  (+)   Peds  Hematology  (+) Blood dyscrasia, anemia Lab Results      Component                Value               Date                      WBC                      6.8                 12/11/2022                HGB                      10.4 (L)            12/11/2022                HCT                      31.4 (L)            12/11/2022                MCV                      96.9                12/11/2022                PLT                      258                 12/11/2022              Anesthesia Other Findings   Reproductive/Obstetrics                             Anesthesia Physical Anesthesia Plan  ASA: 2  Anesthesia Plan: General   Post-op Pain Management:    Induction: Intravenous  PONV Risk Score and Plan: 3 and Ondansetron, Dexamethasone and Treatment may vary due to age or medical condition  Airway Management Planned: Mask and Oral ETT  Additional Equipment: None  Intra-op Plan:   Post-operative Plan: Extubation in OR  Informed Consent: I have reviewed the patients History and  Physical, chart, labs and discussed the procedure including the risks, benefits  and alternatives for the proposed anesthesia with the patient or authorized representative who has indicated his/her understanding and acceptance.     Dental advisory given  Plan Discussed with: CRNA  Anesthesia Plan Comments:        Anesthesia Quick Evaluation

## 2022-12-17 NOTE — Telephone Encounter (Signed)
Please see below and advise.

## 2022-12-18 ENCOUNTER — Encounter (HOSPITAL_COMMUNITY)
Admission: RE | Disposition: A | Discharge status: Home / Self Care | Payer: Self-pay | Source: Home / Self Care | Attending: Urology

## 2022-12-18 ENCOUNTER — Telehealth: Payer: Self-pay | Admitting: Hematology

## 2022-12-18 ENCOUNTER — Ambulatory Visit (HOSPITAL_COMMUNITY): Payer: Self-pay

## 2022-12-18 ENCOUNTER — Other Ambulatory Visit: Payer: Self-pay

## 2022-12-18 ENCOUNTER — Encounter (HOSPITAL_COMMUNITY): Payer: Self-pay | Admitting: Urology

## 2022-12-18 ENCOUNTER — Other Ambulatory Visit (HOSPITAL_COMMUNITY): Payer: BC Managed Care – PPO

## 2022-12-18 ENCOUNTER — Ambulatory Visit (HOSPITAL_COMMUNITY): Payer: BC Managed Care – PPO

## 2022-12-18 ENCOUNTER — Observation Stay (HOSPITAL_COMMUNITY)
Admission: RE | Admit: 2022-12-18 | Discharge: 2022-12-19 | Disposition: A | Discharge status: Home / Self Care | Payer: BC Managed Care – PPO | Attending: Urology | Admitting: Urology

## 2022-12-18 DIAGNOSIS — C61 Malignant neoplasm of prostate: Principal | ICD-10-CM | POA: Diagnosis present

## 2022-12-18 DIAGNOSIS — N320 Bladder-neck obstruction: Secondary | ICD-10-CM | POA: Diagnosis present

## 2022-12-18 DIAGNOSIS — N138 Other obstructive and reflux uropathy: Secondary | ICD-10-CM | POA: Diagnosis not present

## 2022-12-18 DIAGNOSIS — C7951 Secondary malignant neoplasm of bone: Secondary | ICD-10-CM | POA: Diagnosis not present

## 2022-12-18 DIAGNOSIS — Z85828 Personal history of other malignant neoplasm of skin: Secondary | ICD-10-CM | POA: Insufficient documentation

## 2022-12-18 DIAGNOSIS — I10 Essential (primary) hypertension: Principal | ICD-10-CM | POA: Insufficient documentation

## 2022-12-18 DIAGNOSIS — Z8546 Personal history of malignant neoplasm of prostate: Secondary | ICD-10-CM | POA: Diagnosis not present

## 2022-12-18 DIAGNOSIS — N210 Calculus in bladder: Secondary | ICD-10-CM | POA: Insufficient documentation

## 2022-12-18 DIAGNOSIS — E039 Hypothyroidism, unspecified: Secondary | ICD-10-CM | POA: Insufficient documentation

## 2022-12-18 DIAGNOSIS — Z79899 Other long term (current) drug therapy: Secondary | ICD-10-CM | POA: Diagnosis not present

## 2022-12-18 DIAGNOSIS — Z87891 Personal history of nicotine dependence: Secondary | ICD-10-CM | POA: Insufficient documentation

## 2022-12-18 HISTORY — PX: TRANSURETHRAL RESECTION OF BLADDER TUMOR: SHX2575

## 2022-12-18 HISTORY — PX: TRANSURETHRAL RESECTION OF PROSTATE: SHX73

## 2022-12-18 LAB — HIV ANTIBODY (ROUTINE TESTING W REFLEX): HIV Screen 4th Generation wRfx: NONREACTIVE

## 2022-12-18 LAB — ABO/RH: ABO/RH(D): AB POS

## 2022-12-18 SURGERY — TURP (TRANSURETHRAL RESECTION OF PROSTATE)
Anesthesia: General

## 2022-12-18 MED ORDER — CEFAZOLIN SODIUM-DEXTROSE 2-4 GM/100ML-% IV SOLN
2.0000 g | INTRAVENOUS | Status: AC
  Administered 2022-12-18: 2 g via INTRAVENOUS
  Filled 2022-12-18: qty 100

## 2022-12-18 MED ORDER — MEGESTROL ACETATE 40 MG/ML PO SUSP
400.0000 mg | Freq: Two times a day (BID) | ORAL | Status: DC
  Administered 2022-12-19: 400 mg via ORAL
  Filled 2022-12-18 (×3): qty 10

## 2022-12-18 MED ORDER — ESMOLOL HCL 100 MG/10ML IV SOLN
INTRAVENOUS | Status: DC | PRN
Start: 2022-12-18 — End: 2022-12-18
  Administered 2022-12-18 (×4): 20 mg via INTRAVENOUS

## 2022-12-18 MED ORDER — DEXAMETHASONE 4 MG PO TABS: 4.0000 mg | ORAL_TABLET | Freq: Every day | ORAL | Status: DC

## 2022-12-18 MED ORDER — POTASSIUM CHLORIDE IN NACL 20-0.45 MEQ/L-% IV SOLN
INTRAVENOUS | Status: DC
  Administered 2022-12-18: 100 mL/h via INTRAVENOUS
  Filled 2022-12-18 (×3): qty 1000

## 2022-12-18 MED ORDER — MIDAZOLAM HCL 5 MG/5ML IJ SOLN
INTRAMUSCULAR | Status: DC | PRN
  Administered 2022-12-18: 2 mg via INTRAVENOUS

## 2022-12-18 MED ORDER — SUGAMMADEX SODIUM 200 MG/2ML IV SOLN
INTRAVENOUS | Status: DC | PRN
Start: 2022-12-18 — End: 2022-12-18
  Administered 2022-12-18: 270 mg via INTRAVENOUS

## 2022-12-18 MED ORDER — FENTANYL CITRATE PF 50 MCG/ML IJ SOSY
25.0000 ug | PREFILLED_SYRINGE | INTRAMUSCULAR | Status: DC | PRN
  Administered 2022-12-18: 50 ug via INTRAVENOUS

## 2022-12-18 MED ORDER — ZOLPIDEM TARTRATE 5 MG PO TABS: 5.0000 mg | ORAL_TABLET | Freq: Every evening | ORAL | Status: DC | PRN

## 2022-12-18 MED ORDER — HYDROMORPHONE HCL 1 MG/ML IJ SOLN
0.5000 mg | INTRAMUSCULAR | Status: DC | PRN
  Administered 2022-12-18: 0.5 mg via INTRAVENOUS
  Filled 2022-12-18: qty 1

## 2022-12-18 MED ORDER — DEXAMETHASONE SODIUM PHOSPHATE 10 MG/ML IJ SOLN
INTRAMUSCULAR | Status: DC | PRN
Start: 2022-12-18 — End: 2022-12-18
  Administered 2022-12-18: 8 mg via INTRAVENOUS

## 2022-12-18 MED ORDER — ORAL CARE MOUTH RINSE: 15.0000 mL | Freq: Once | OROMUCOSAL | Status: AC

## 2022-12-18 MED ORDER — PROPOFOL 10 MG/ML IV BOLUS
INTRAVENOUS | Status: AC
  Filled 2022-12-18: qty 20

## 2022-12-18 MED ORDER — HYDROMORPHONE HCL 1 MG/ML IJ SOLN
INTRAMUSCULAR | Status: AC
  Administered 2022-12-18: 1 mg
  Filled 2022-12-18: qty 1

## 2022-12-18 MED ORDER — FENTANYL CITRATE PF 50 MCG/ML IJ SOSY
PREFILLED_SYRINGE | INTRAMUSCULAR | Status: AC
  Administered 2022-12-18: 50 ug via INTRAVENOUS
  Filled 2022-12-18: qty 1

## 2022-12-18 MED ORDER — FENTANYL CITRATE (PF) 100 MCG/2ML IJ SOLN
INTRAMUSCULAR | Status: DC | PRN
  Administered 2022-12-18: 100 ug via INTRAVENOUS
  Administered 2022-12-18 (×2): 50 ug via INTRAVENOUS

## 2022-12-18 MED ORDER — CHLORHEXIDINE GLUCONATE 0.12 % MT SOLN
15.0000 mL | Freq: Once | OROMUCOSAL | Status: AC
  Administered 2022-12-18: 15 mL via OROMUCOSAL

## 2022-12-18 MED ORDER — IRBESARTAN 150 MG PO TABS
150.0000 mg | ORAL_TABLET | Freq: Every day | ORAL | Status: DC
  Administered 2022-12-19: 150 mg via ORAL
  Filled 2022-12-18 (×2): qty 1

## 2022-12-18 MED ORDER — METOPROLOL TARTRATE 25 MG PO TABS
12.5000 mg | ORAL_TABLET | Freq: Two times a day (BID) | ORAL | Status: DC
  Administered 2022-12-19: 12.5 mg via ORAL
  Filled 2022-12-18 (×2): qty 1

## 2022-12-18 MED ORDER — FENTANYL CITRATE PF 50 MCG/ML IJ SOSY
PREFILLED_SYRINGE | INTRAMUSCULAR | Status: AC
  Filled 2022-12-18: qty 1

## 2022-12-18 MED ORDER — ONDANSETRON HCL 4 MG/2ML IJ SOLN
INTRAMUSCULAR | Status: AC
  Filled 2022-12-18: qty 2

## 2022-12-18 MED ORDER — 0.9 % SODIUM CHLORIDE (POUR BTL) OPTIME
TOPICAL | Status: DC | PRN
  Administered 2022-12-18: 1000 mL

## 2022-12-18 MED ORDER — ACETAMINOPHEN 325 MG PO TABS: 650.0000 mg | ORAL_TABLET | ORAL | Status: DC | PRN

## 2022-12-18 MED ORDER — ONDANSETRON 4 MG PO TBDP: 8.0000 mg | ORAL_TABLET | Freq: Three times a day (TID) | ORAL | Status: DC | PRN

## 2022-12-18 MED ORDER — LACTATED RINGERS IV SOLN: INTRAVENOUS | Status: DC

## 2022-12-18 MED ORDER — LIDOCAINE HCL (CARDIAC) PF 100 MG/5ML IV SOSY
PREFILLED_SYRINGE | INTRAVENOUS | Status: DC | PRN
  Administered 2022-12-18: 60 mg via INTRAVENOUS

## 2022-12-18 MED ORDER — SODIUM CHLORIDE 0.9 % IR SOLN
Status: DC | PRN
  Administered 2022-12-18 (×2): 3000 mL via INTRAVESICAL

## 2022-12-18 MED ORDER — VITAMIN D 25 MCG (1000 UNIT) PO TABS
1000.0000 [IU] | ORAL_TABLET | Freq: Every day | ORAL | Status: DC
  Filled 2022-12-18 (×2): qty 1

## 2022-12-18 MED ORDER — DOCUSATE SODIUM 100 MG PO CAPS
100.0000 mg | ORAL_CAPSULE | Freq: Every day | ORAL | Status: DC
  Administered 2022-12-19: 100 mg via ORAL
  Filled 2022-12-18 (×2): qty 1

## 2022-12-18 MED ORDER — FLEET ENEMA RE ENEM
1.0000 | ENEMA | Freq: Once | RECTAL | Status: DC | PRN
  Filled 2022-12-18: qty 1

## 2022-12-18 MED ORDER — THYROID 60 MG PO TABS
60.0000 mg | ORAL_TABLET | Freq: Every morning | ORAL | Status: DC
  Filled 2022-12-18: qty 1

## 2022-12-18 MED ORDER — ONDANSETRON HCL 4 MG/2ML IJ SOLN
INTRAMUSCULAR | Status: DC | PRN
  Administered 2022-12-18: 4 mg via INTRAVENOUS

## 2022-12-18 MED ORDER — OXYCODONE HCL 5 MG PO TABS
ORAL_TABLET | ORAL | Status: AC
  Filled 2022-12-18: qty 1

## 2022-12-18 MED ORDER — ONDANSETRON HCL 4 MG/2ML IJ SOLN: 4.0000 mg | INTRAMUSCULAR | Status: DC | PRN

## 2022-12-18 MED ORDER — DRONABINOL 2.5 MG PO CAPS
5.0000 mg | ORAL_CAPSULE | Freq: Two times a day (BID) | ORAL | Status: DC
  Administered 2022-12-19: 5 mg via ORAL
  Filled 2022-12-18: qty 2

## 2022-12-18 MED ORDER — LIDOCAINE HCL (PF) 2 % IJ SOLN
INTRAMUSCULAR | Status: AC
  Filled 2022-12-18: qty 5

## 2022-12-18 MED ORDER — ESMOLOL HCL 100 MG/10ML IV SOLN
INTRAVENOUS | Status: AC
  Filled 2022-12-18: qty 10

## 2022-12-18 MED ORDER — HYOSCYAMINE SULFATE 0.125 MG SL SUBL: 0.1250 mg | SUBLINGUAL_TABLET | SUBLINGUAL | Status: DC | PRN

## 2022-12-18 MED ORDER — ROCURONIUM BROMIDE 10 MG/ML (PF) SYRINGE
PREFILLED_SYRINGE | INTRAVENOUS | Status: AC
  Filled 2022-12-18: qty 10

## 2022-12-18 MED ORDER — ROCURONIUM BROMIDE 100 MG/10ML IV SOLN
INTRAVENOUS | Status: DC | PRN
  Administered 2022-12-18: 70 mg via INTRAVENOUS

## 2022-12-18 MED ORDER — SENNOSIDES-DOCUSATE SODIUM 8.6-50 MG PO TABS: 1.0000 | ORAL_TABLET | Freq: Every evening | ORAL | Status: DC | PRN

## 2022-12-18 MED ORDER — PROPOFOL 10 MG/ML IV BOLUS
INTRAVENOUS | Status: DC | PRN
Start: 2022-12-18 — End: 2022-12-18
  Administered 2022-12-18: 180 mg via INTRAVENOUS

## 2022-12-18 MED ORDER — DROPERIDOL 2.5 MG/ML IJ SOLN: 0.6250 mg | Freq: Once | INTRAMUSCULAR | Status: DC | PRN

## 2022-12-18 MED ORDER — MIDAZOLAM HCL 2 MG/2ML IJ SOLN
INTRAMUSCULAR | Status: AC
  Filled 2022-12-18: qty 2

## 2022-12-18 MED ORDER — FENTANYL CITRATE (PF) 100 MCG/2ML IJ SOLN
INTRAMUSCULAR | Status: AC
  Filled 2022-12-18: qty 2

## 2022-12-18 MED ORDER — ACETAMINOPHEN 500 MG PO TABS
1000.0000 mg | ORAL_TABLET | Freq: Once | ORAL | Status: AC
  Administered 2022-12-18: 1000 mg via ORAL
  Filled 2022-12-18: qty 2

## 2022-12-18 MED ORDER — DEXAMETHASONE SODIUM PHOSPHATE 10 MG/ML IJ SOLN
INTRAMUSCULAR | Status: AC
  Filled 2022-12-18: qty 1

## 2022-12-18 MED ORDER — BISACODYL 10 MG RE SUPP: 10.0000 mg | Freq: Every day | RECTAL | Status: DC | PRN

## 2022-12-18 MED ORDER — HYDROCODONE-ACETAMINOPHEN 5-325 MG PO TABS
1.0000 | ORAL_TABLET | Freq: Four times a day (QID) | ORAL | Status: DC | PRN
  Administered 2022-12-18 – 2022-12-19 (×3): 1 via ORAL
  Filled 2022-12-18 (×2): qty 1

## 2022-12-18 MED ORDER — PROCHLORPERAZINE MALEATE 10 MG PO TABS: 10.0000 mg | ORAL_TABLET | Freq: Four times a day (QID) | ORAL | Status: DC | PRN

## 2022-12-18 MED ORDER — OXYCODONE HCL 5 MG PO TABS
5.0000 mg | ORAL_TABLET | ORAL | Status: DC | PRN
  Administered 2022-12-18 (×2): 5 mg via ORAL
  Filled 2022-12-18: qty 1

## 2022-12-18 SURGICAL SUPPLY — 25 items
BAG DRN RND TRDRP ANRFLXCHMBR (UROLOGICAL SUPPLIES)
BAG URINE DRAIN 2000ML AR STRL (UROLOGICAL SUPPLIES) IMPLANT
BAG URO CATCHER STRL LF (MISCELLANEOUS) ×1 IMPLANT
CATH COUNCIL 22FR (CATHETERS) IMPLANT
CATH FOLEY 3WAY 30CC 22FR (CATHETERS) IMPLANT
CATH URETL OPEN 5X70 (CATHETERS) IMPLANT
DRAPE FOOT SWITCH (DRAPES) ×1 IMPLANT
ELECT HOOK LOOP BIPOLAR (NEEDLE) IMPLANT
ELECT REM PT RETURN 15FT ADLT (MISCELLANEOUS) ×1 IMPLANT
GLOVE SURG SS PI 8.0 STRL IVOR (GLOVE) ×1 IMPLANT
GOWN STRL REUS W/ TWL XL LVL3 (GOWN DISPOSABLE) ×1 IMPLANT
GOWN STRL REUS W/TWL XL LVL3 (GOWN DISPOSABLE) ×1
GUIDEWIRE ZIPWRE .038 STRAIGHT (WIRE) IMPLANT
HOLDER FOLEY CATH W/STRAP (MISCELLANEOUS) IMPLANT
KIT TURNOVER KIT A (KITS) IMPLANT
LOOP CUT BIPOLAR 24F LRG (ELECTROSURGICAL) IMPLANT
MANIFOLD NEPTUNE II (INSTRUMENTS) ×1 IMPLANT
PACK CYSTO (CUSTOM PROCEDURE TRAY) ×1 IMPLANT
PAD PREP 24X48 CUFFED NSTRL (MISCELLANEOUS) ×1 IMPLANT
SUT ETHILON 3 0 PS 1 (SUTURE) IMPLANT
SYR 30ML LL (SYRINGE) IMPLANT
SYR TOOMEY IRRIG 70ML (MISCELLANEOUS)
SYRINGE TOOMEY IRRIG 70ML (MISCELLANEOUS) IMPLANT
TUBING CONNECTING 10 (TUBING) ×1 IMPLANT
TUBING UROLOGY SET (TUBING) ×1 IMPLANT

## 2022-12-18 NOTE — Transfer of Care (Signed)
Immediate Anesthesia Transfer of Care Note  Patient: Scott [name]  Procedure(s) Performed: TRANSURETHRAL RESECTION OF THE PROSTATE (TURP) REMOVAL OF BLADDER STONES  Patient Location: PACU  Anesthesia Type:General  Level of Consciousness: awake  Airway & Oxygen Therapy: Patient Spontanous Breathing  Post-op Assessment: Report given to RN and Post -op Vital signs reviewed and stable  Post vital signs: Reviewed and stable  Last Vitals:  Vitals Value Taken Time  BP 148/99 12/18/22 0825  Temp    Pulse 93 12/18/22 0828  Resp 20 12/18/22 0828  SpO2 96 % 12/18/22 0828  Vitals shown include unfiled device data.  Last Pain:  Vitals:   12/18/22 0557  TempSrc:   PainSc: 0-No pain         Complications: No notable events documented.

## 2022-12-18 NOTE — Interval H&P Note (Signed)
History and Physical Interval Note:  12/18/2022 7:03 AM  Scott [name]  has presented today for surgery, with the diagnosis of PROSTATE CANCER.  The various methods of treatment have been discussed with the patient and family. After consideration of risks, benefits and other options for treatment, the patient has consented to  Procedure(s) with comments: TRANSURETHRAL RESECTION OF THE PROSTATE (TURP) (N/A) TRANSURETHRAL RESECTION OF BLADDER TUMOR (TURBT) (N/A) - 1 HR FOR CASE as a surgical intervention.  The patient's history has been reviewed, patient examined, no change in status, stable for surgery.  I have reviewed the patient's chart and labs.  Questions were answered to the patient's satisfaction.     Bjorn Pippin

## 2022-12-18 NOTE — Anesthesia Postprocedure Evaluation (Signed)
Anesthesia Post Note  Patient: Scott [name]  Procedure(s) Performed: TRANSURETHRAL RESECTION OF THE PROSTATE (TURP) REMOVAL OF BLADDER STONES     Patient location during evaluation: PACU Anesthesia Type: General Level of consciousness: awake and alert Pain management: pain level controlled Vital Signs Assessment: post-procedure vital signs reviewed and stable Respiratory status: spontaneous breathing, nonlabored ventilation, respiratory function stable and patient connected to nasal cannula oxygen Cardiovascular status: blood pressure returned to baseline and stable Postop Assessment: no apparent nausea or vomiting Anesthetic complications: no   No notable events documented.  Last Vitals:  Vitals:   12/18/22 1200 12/18/22 1234  BP: (!) 141/73 (!) 141/71  Pulse: 72 69  Resp: 16 14  Temp:    SpO2: 94% 96%    Last Pain:  Vitals:   12/18/22 1234  TempSrc:   PainSc: 5                  Ayansh Feutz P Wally Shevchenko

## 2022-12-18 NOTE — Op Note (Signed)
Procedure: 1.  Cystoscopy with removal of bladder stones. 2.  Transurethral resection of the prostate for recurrent prostate cancer.  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  Postop diagnosis: Same with bladder stones.  Surgeon: Dr. Bjorn Pippin.  Anesthesia: General.  Specimen: Prostate chips.  Drains: 22 French Councill catheter.  EBL: 5 mL.  Complications: None.  Indications: The patient is a 67 year old male with a history of castrate resistant metastatic prostate cancer who has recently experienced progressive obstructive voiding symptoms consistent with regrowth of prostate cancer as he is experienced previously.  He is to undergo cystoscopy and resection.  Procedure: He was taken operating room was given antibiotic.  A general anesthetic was induced.  He was placed in the lithotomy position and fitted with PAS hose.  His perineum and genitalia were prepped with Betadine solution was draped in usual sterile fashion.  Cystoscopy was performed using a 21 [place] scope and 30 degree lens.  Examination revealed a normal caliber urethra but in the proximal urethra primarily on the right and anteriorly there were studding which is concerning for submucosal prostate cancer implants.  The external sphincter was fixed and it was very difficult to get the scope through into the prostatic urethra but I was able to successfully advance the scope into the bladder.  There was regrowth of cancer at the prostatic apex primarily on the right but also on the left and at the bladder neck.  Inspection of the bladder revealed mild trabeculation with a few stones at the base of the bladder.  The ureteral orifices were not clearly visualized.  There appeared to be some infiltration of the trigone with tumor.  He had had a recent CT and had no hydronephrosis.  There were a few adherent stones at the bladder neck in addition to some stones that were loose within the bladder.  I  then passed the 26 French continuous-flow resectoscope sheath with the aid of visual obturator after urethral calibration of the 32 [place].  Once the scope was in the bladder the visual obturator was removed and an Wandra Scot handle was placed with a bipolar loop and 30 degree lens.  Saline was used the irrigant.  The stones were evacuated from the bladder and the encrustation at the bladder neck was freed from the mucosa and also evacuated.  I then resected recurrent tumor from the right posterior bladder neck.  There was a large area of recurrence at the right anterior bladder neck which was resected along with the right lateral lobe and apex with a small amount from the left side.  I resected from bladder neck out to the verumontanum which is still barely visible from prior resections.  Once an adequate channel had been created, the bladder was evacuated free of chips and hemostasis was achieved.  Final inspection revealed no active bleeding or retained tissue.  The external sphincter appears somewhat fixed.  This resectoscope was removed and the cystoscope was reinserted.  A Glidewire was passed into the bladder through the scope which was then removed and a 22 [place] Councill catheter was inserted over the wire into the bladder.  There was some resistance at the prostatic apex because of the fixation there but I was able to get into the bladder.  The balloon was filled with 30 mL of sterile fluid and the catheter was irrigated with clear return.  The cath was placed to straight drainage.  She was taken down from lithotomy position, his anesthetic was reversed  and he was moved to recovery in stable condition.  There were no complications.

## 2022-12-18 NOTE — Anesthesia Procedure Notes (Signed)
Procedure Name: Intubation Date/Time: 12/18/2022 7:40 AM  Performed by: Sampson Goon, CRNAPre-anesthesia Checklist: Patient identified, Emergency Drugs available, Suction available and Patient being monitored Patient Re-evaluated:Patient Re-evaluated prior to induction Oxygen Delivery Method: Circle System Utilized Preoxygenation: Pre-oxygenation with 100% oxygen Induction Type: IV induction Ventilation: Mask ventilation without difficulty Laryngoscope Size: Mac and 4 Grade View: Grade III Tube type: Oral Tube size: 7.5 mm Number of attempts: 1 Airway Equipment and Method: Stylet and Oral airway Placement Confirmation: ETT inserted through vocal cords under direct vision, positive ETCO2 and breath sounds checked- equal and bilateral Secured at: 22 cm Tube secured with: Tape Dental Injury: Teeth and Oropharynx as per pre-operative assessment

## 2022-12-18 NOTE — Telephone Encounter (Signed)
Faxed denied claim and appeal to [facility]  PENDING

## 2022-12-19 ENCOUNTER — Inpatient Hospital Stay: Payer: BC Managed Care – PPO | Admitting: Hematology

## 2022-12-19 ENCOUNTER — Encounter (HOSPITAL_COMMUNITY): Payer: Self-pay | Admitting: Urology

## 2022-12-19 DIAGNOSIS — N210 Calculus in bladder: Secondary | ICD-10-CM | POA: Insufficient documentation

## 2022-12-19 DIAGNOSIS — C61 Malignant neoplasm of prostate: Secondary | ICD-10-CM | POA: Diagnosis not present

## 2022-12-19 LAB — SURGICAL PATHOLOGY

## 2022-12-19 MED ORDER — CHLORHEXIDINE GLUCONATE CLOTH 2 % EX PADS
6.0000 | MEDICATED_PAD | Freq: Every day | CUTANEOUS | Status: DC
  Administered 2022-12-19 (×2): 6 via TOPICAL

## 2022-12-19 NOTE — Progress Notes (Addendum)
Pt urinated only 50 ml after Foley removal. Bloody urine and per pt painful urination. Stated tried several times in order to get the urine out. MD notified. Per MD place Foley back, D/c with Foley and follow up with urology.

## 2022-12-19 NOTE — Progress Notes (Addendum)
   Discharge paperwork given: to patient and family  Reviewed with teach back  IV removed  Belongings given to patient  Pt  is d/c with Foley  Switched to leg bag.  Awaiting on ride.

## 2022-12-19 NOTE — Progress Notes (Signed)
Transition of Care [facility]) - Inpatient Brief Assessment   Patient Details  Name: Scott [name] MRN: [id number] Date of Birth: 1955/09/15  Transition of Care [facility]) CM/SW Contact:    Larrie Kass, LCSW Phone Number: 12/19/2022, 12:22 PM      Transition of Care Asessment: Insurance and Status: Insurance coverage has been reviewed Patient has primary care physician: Yes Home environment has been reviewed: home with spouse Prior level of function:: independent Prior/Current Home Services: No current home services Social Determinants of Health Reivew: SDOH reviewed no interventions necessary Readmission risk has been reviewed: Yes Transition of care needs: no transition of care needs at this time

## 2022-12-19 NOTE — Discharge Summary (Signed)
Physician Discharge Summary  Patient ID: Scott [name] MRN: [id number] DOB/AGE: Mar 01, 1956 67 y.o.  Admit date: 12/18/2022 Discharge date: 12/19/2022  Admission Diagnoses:  Prostate cancer [facility])  Discharge Diagnoses:  Principal Problem:   Prostate cancer [facility]) Active Problems:   Prostate cancer metastatic to multiple sites [facility])   Bladder outlet obstruction   Malignant neoplasm of prostate metastatic to bone [facility])   Bladder stones   Past Medical History:  Diagnosis Date   Anemia    Bladder obstruction    Complication of anesthesia    Diverticulitis 2015   Dyspnea    Essential hypertension, benign 01/26/2019   at doctor's office elevated, normal at home, no medications at this time   GERD (gastroesophageal reflux disease)    History of basal cell cancer    HLD (hyperlipidemia) 01/26/2019   Hypothyroidism    history of, not currently taking medication   PONV (postoperative nausea and vomiting)    Prostate cancer (HCC)    metastatic to bones   Vitamin D deficiency disease 01/26/2019    Surgeries: Procedure(s): TRANSURETHRAL RESECTION OF THE PROSTATE (TURP) REMOVAL OF BLADDER STONES on 12/18/2022   Consultants (if any):   Discharged Condition: Improved  Hospital Course: Scott [name] is an 67 y.o. male who was admitted 12/18/2022 with a diagnosis of Prostate cancer [facility]) and went to the operating room on 12/18/2022 and underwent the above named procedures.  He tolerated the procedure well and his urine is clear this morning.  He will be discharged.   He was given perioperative antibiotics:  Anti-infectives (From admission, onward)    Start     Dose/Rate Route Frequency Ordered Stop   12/18/22 0543  ceFAZolin (ANCEF) IVPB 2g/100 mL premix        2 g 200 mL/hr over 30 Minutes Intravenous 30 min pre-op 12/18/22 0543 12/18/22 0745     .  He was given sequential compression devices for DVT prophylaxis.  He benefited maximally from the hospital stay and there were no  complications.    Recent vital signs:  Vitals:   12/19/22 0135 12/19/22 0531  BP: 136/78 130/70  Pulse: 61 60  Resp: 18 18  Temp: 99 F (37.2 C) 98.9 F (37.2 C)  SpO2: 97% 96%    Recent laboratory studies:  Lab Results  Component Value Date   HGB 10.4 (L) 12/11/2022   HGB 10.0 (L) 12/06/2022   HGB 11.5 (L) 12/04/2022   Lab Results  Component Value Date   WBC 6.8 12/11/2022   PLT 258 12/11/2022   Lab Results  Component Value Date   INR 1.0 04/09/2022   Lab Results  Component Value Date   NA 132 (L) 12/11/2022   K 4.3 12/11/2022   CL 104 12/11/2022   CO2 21 (L) 12/11/2022   BUN 16 12/11/2022   CREATININE 0.64 12/11/2022   GLUCOSE 91 12/11/2022    Discharge Medications:   Allergies as of 12/19/2022       Reactions   Crestor [rosuvastatin] Other (See Comments)   myalgia   Ivp Dye [iodinated Contrast Media] Swelling   Swelling on head, took Benadryl and swelling reduced   Wellbutrin [bupropion] Hives        Medication List     TAKE these medications    B-4 Med Compression Hose Mens Misc Wear as needed when awake to help prevent leg swelling   Cholecalciferol 125 MCG (5000 UT) capsule Take 10,000 Units by mouth daily.   dexamethasone 4 MG tablet Commonly known  as: DECADRON Take 1 tablet (4 mg total) by mouth daily. Take for three days after each Pluvicto treatment   docusate sodium 100 MG capsule Commonly known as: COLACE Take 100 mg by mouth daily.   dronabinol 5 MG capsule Commonly known as: MARINOL TAKE 1 CAPSULE(5 MG) BY MOUTH TWICE DAILY BEFORE A MEAL   dutasteride 0.5 MG capsule Commonly known as: AVODART TAKE 1 CAPSULE(0.5 MG) BY MOUTH DAILY   estradiol 0.1 MG/24HR patch Commonly known as: VIVELLE-DOT APPLY 1 PATCH(0.1 MG) TOPICALLY TO THE SKIN 2 TIMES A WEEK   HYDROcodone-acetaminophen 5-325 MG tablet Commonly known as: Norco Take 1 tablet by mouth every 6 (six) hours as needed for moderate pain.   MAGNESIUM PO Take 1 tablet  by mouth at bedtime.   megestrol 40 MG/ML suspension Commonly known as: MEGACE SHAKE LIQUID AND TAKE 10 ML(400 MG) BY MOUTH TWICE DAILY   MELATONIN PO Take 40 mg by mouth at bedtime.   metoprolol tartrate 25 MG tablet Commonly known as: LOPRESSOR Take 0.5 tablets (12.5 mg total) by mouth 2 (two) times daily.   multivitamin with minerals Tabs tablet Take 1 tablet by mouth daily.   NATTOKINASE PO Take 2 capsules by mouth daily.   NP Thyroid 120 MG tablet Generic drug: thyroid Take 0.5 tablets (60 mg total) by mouth every morning.   olmesartan 20 MG tablet Commonly known as: BENICAR TAKE 1 TABLET(20 MG) BY MOUTH DAILY   ondansetron 8 MG disintegrating tablet Commonly known as: Zofran ODT Take 1 tablet (8 mg total) by mouth every 8 (eight) hours as needed for nausea or vomiting.   phenazopyridine 200 MG tablet Commonly known as: PYRIDIUM Take 1 tablet (200 mg total) by mouth 3 (three) times daily.   prochlorperazine 10 MG tablet Commonly known as: COMPAZINE Take 10 mg by mouth daily.   prochlorperazine 25 MG suppository Commonly known as: COMPAZINE Place 1 suppository (25 mg total) rectally every 8 (eight) hours as needed for nausea or vomiting.   pyridOXINE 25 MG tablet Commonly known as: VITAMIN B6 Take 25 mg by mouth daily.   thiamine 50 MG tablet Commonly known as: VITAMIN B-1 Take 1 tablet (50 mg total) by mouth daily.   traMADol 50 MG tablet Commonly known as: ULTRAM Take 2 tablets (100 mg total) by mouth every 6 (six) hours as needed.   vitamin E 180 MG (400 UNITS) capsule Take 400 Units by mouth daily.   VITAMIN K PO Take 1 capsule by mouth daily.        Diagnostic Studies: MR PELVIS W WO CONTRAST  Result Date: 12/04/2022 CLINICAL DATA:  Bladder mass, status post TURP, history of prostate cancer EXAM: MRI PELVIS WITHOUT AND WITH CONTRAST TECHNIQUE: Multiplanar multisequence MR imaging of the pelvis was performed both before and after  administration of intravenous contrast. CONTRAST:  7mL GADAVIST GADOBUTROL 1 MMOL/ML IV SOLN COMPARISON:  CT abdomen/pelvis dated 12/04/2022 FINDINGS: Urinary Tract: Bladder is mildly thick-walled although underdistended. Soft tissue along the bladder base, described below. Bowel:  Sigmoid diverticulosis, without evidence of diverticulitis. Vascular/Lymphatic: No evidence of abdominal aortic aneurysm. No suspicious abdominal lymphadenopathy. 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 Additional enhancing tumor involving the base of the seminal vesicles (series 8/image 51). Other:  Trace presacral fluid. Musculoskeletal: Multifocal osseous metastases. IMPRESSION: Status post TURP with enhancing soft tissue indenting the bladder base, suggesting tumor in this patient with  known prostatic adenocarcinoma. If there is concern for primary bladder carcinoma, consider cystoscopy. Additional enhancing tumor involving the base of the seminal vesicles. Multifocal osseous metastases. Electronically Signed   By: Charline Bills M.D.   On: 12/04/2022 20:14   CT ABDOMEN PELVIS WO CONTRAST  Result Date: 12/04/2022 CLINICAL DATA:  Bladder dysfunction. History of metastatic prostate cancer. Concern for bladder mass. * Tracking Code: BO * EXAM: CT ABDOMEN AND PELVIS WITHOUT CONTRAST TECHNIQUE: Multidetector CT imaging of the abdomen and pelvis was performed following the standard protocol without IV contrast. RADIATION DOSE REDUCTION: This exam was performed according to the departmental dose-optimization program which includes automated exposure control, adjustment of the mA and/or kV according to patient size and/or use of iterative reconstruction technique. COMPARISON:  CT 07/26/2022 FINDINGS: Lower chest: 4 mm subpleural left lower lobe lung nodule on image 40 of series 4. This measured 3 mm  previously, similar. There is some bilateral lung base areas of atelectasis. Breathing motion. Stable 2-3 mm nodule subpleural right lower lobe series 4, image 17 as well. No pleural effusion. Gynecomastia. Hepatobiliary: Stones in the nondilated gallbladder. On this non IV contrast exam, no clear space-occupying liver lesion. Evaluation for solid organ pathology is limited without the advantage of IV contrast. Pancreas: Global mild atrophy of the pancreas particularly the head neck and body region. Unchanged from previous. Spleen: Normal in size without focal abnormality. Adrenals/Urinary Tract: Punctate calcification along the right adrenal gland. Stable. Normal left adrenal. No abnormal calcification seen within either kidney nor along the course of either ureter. Slight ectasia of the distal ureters, nonspecific. Preserved contours of the urinary bladder with some wall thickening and stranding. Stomach/Bowel: There is fluid and debris along the stomach. Large bowel has a normal course and caliber with moderate colonic stool. Few distal colonic diverticula. The appendix is grossly preserved in the right lower quadrant and is nondilated. Small bowel is nondilated. Vascular/Lymphatic: Aortic atherosclerosis. No enlarged abdominal or pelvic lymph nodes. Reproductive: Lobular prostate with TURP defect and calcifications. Previously there was a enhancing nodule along the margin of the TURP defect inferiorly. This has less well defined without the advantage of IV contrast but the contour deformity is still present on series 2, image 74. Additional evaluation as clinically appropriate. MRI may be of some benefit as an option Other: Nonspecific presacral fat stranding, increased from previous. Small bilateral fat containing inguinal hernias. Epigastric midline anterior abdominal wall moderate fat containing hernia. The fat in the hernia sac is with some stranding. This was seen previously. Musculoskeletal: Extensive  sclerotic bone metastases again identified. Comparing current to prior the extent distribution of the areas is markedly increased throughout the pelvis, proximal femurs, spine and ribs. There is also sternal lesions. 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 Nodular prostate with mass effect along the bladder. Stable bladder wall thickening and stranding. The nodular area in the area of the TURP defect of the prostate on the prior is suggested again today but less well seen without the advantage of IV contrast. Additional workup is recommended as clinically appropriate such as MRI. Increasing presacral fat stranding. Gallstones. Gynecomastia. Tiny lung nodules. Additional follow up as per the patient's neoplasm. Electronically Signed   By: Karen Kays M.D.   On: 12/04/2022 17:52   CT HEAD WO CONTRAST ( )  Result Date: 12/04/2022 CLINICAL DATA:  Trauma, pain EXAM: CT HEAD WITHOUT CONTRAST CT MAXILLOFACIAL WITHOUT CONTRAST CT  CERVICAL SPINE WITHOUT CONTRAST TECHNIQUE: Multidetector CT imaging of the head, cervical spine, and maxillofacial structures were performed using the standard protocol without intravenous contrast. Multiplanar CT image reconstructions of the cervical spine and maxillofacial structures were also generated. RADIATION DOSE REDUCTION: This exam was performed according to the departmental dose-optimization program which includes automated exposure control, adjustment of the mA and/or kV according to patient size and/or use of iterative reconstruction technique. COMPARISON:  None Available. FINDINGS: CT HEAD FINDINGS Brain: There is no acute intracranial hemorrhage, extra-axial fluid collection, or acute territorial infarct. Parenchymal volume is normal. The ventricles are normal in size. There is an age-indeterminate small infarct in the right basal ganglia/corona radiata. The pituitary and suprasellar  region are normal. There is no mass lesion. There is no mass effect or midline shift. Vascular: No hyperdense vessel or unexpected calcification. Skull: There is osseous metastatic disease in the clivus and bilateral sphenoid bones. Other: The mastoid air cells and middle ear cavities are clear. CT MAXILLOFACIAL FINDINGS Osseous: There is no acute facial bone fracture. There is no evidence of mandibular dislocation. There is abnormal sclerosis in the bilateral sphenoid bones consistent with osseous metastatic disease. No lytic destructive lesion is seen in the facial bones. Orbits: The globes and orbits are unremarkable. Sinuses: There is a small retention cyst in the left maxillary sinus. Soft tissues: Unremarkable. CT CERVICAL SPINE FINDINGS Alignment: There is mild anterolisthesis of C3 on C4 and retrolisthesis of C4 on C5, likely degenerative. There is no evidence of traumatic malalignment. Skull base and vertebrae: Skull base alignment is maintained. Vertebral body heights are preserved. There is no evidence of acute fracture. There is extensive sclerotic osseous metastatic disease. Soft tissues and spinal canal: No prevertebral fluid or swelling. No visible canal hematoma. Disc levels: There is moderate disc space narrowing and degenerative endplate change at C4-C5 through C6-C7 and moderate left facet arthropathy at C3-C4 there is no evidence of high-grade spinal canal stenosis. Upper chest: The imaged lung apices are clear. A right chest wall port is partially imaged. Other: None. IMPRESSION: 1. No acute intracranial hemorrhage or calvarial fracture. 2. 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 3. No acute facial bone fracture. 4. No acute fracture or traumatic malalignment of the cervical spine. 5. Extensive sclerotic osseous metastatic disease. Electronically Signed   By: Lesia Hausen M.D.   On: 12/04/2022 17:12   CT CERVICAL SPINE WO CONTRAST  Result Date:  12/04/2022 CLINICAL DATA:  Trauma, pain EXAM: CT HEAD WITHOUT CONTRAST CT MAXILLOFACIAL WITHOUT CONTRAST CT CERVICAL SPINE WITHOUT CONTRAST TECHNIQUE: Multidetector CT imaging of the head, cervical spine, and maxillofacial structures were performed using the standard protocol without intravenous contrast. Multiplanar CT image reconstructions of the cervical spine and maxillofacial structures were also generated. RADIATION DOSE REDUCTION: This exam was performed according to the departmental dose-optimization program which includes automated exposure control, adjustment of the mA and/or kV according to patient size and/or use of iterative reconstruction technique. COMPARISON:  None Available. FINDINGS: CT HEAD FINDINGS Brain: There is no acute intracranial hemorrhage, extra-axial fluid collection, or acute territorial infarct. Parenchymal volume is normal. The ventricles are normal in size. There is an age-indeterminate small infarct in the right basal ganglia/corona radiata. The pituitary and suprasellar region are normal. There is no mass lesion. There is no mass effect or midline shift. Vascular: No hyperdense vessel or unexpected calcification. Skull: There is osseous metastatic disease in the clivus and bilateral sphenoid bones. Other: The mastoid air cells and middle  ear cavities are clear. CT MAXILLOFACIAL FINDINGS Osseous: There is no acute facial bone fracture. There is no evidence of mandibular dislocation. There is abnormal sclerosis in the bilateral sphenoid bones consistent with osseous metastatic disease. No lytic destructive lesion is seen in the facial bones. Orbits: The globes and orbits are unremarkable. Sinuses: There is a small retention cyst in the left maxillary sinus. Soft tissues: Unremarkable. CT CERVICAL SPINE FINDINGS Alignment: There is mild anterolisthesis of C3 on C4 and retrolisthesis of C4 on C5, likely degenerative. There is no evidence of traumatic malalignment. Skull base and  vertebrae: Skull base alignment is maintained. Vertebral body heights are preserved. There is no evidence of acute fracture. There is extensive sclerotic osseous metastatic disease. Soft tissues and spinal canal: No prevertebral fluid or swelling. No visible canal hematoma. Disc levels: There is moderate disc space narrowing and degenerative endplate change at C4-C5 through C6-C7 and moderate left facet arthropathy at C3-C4 there is no evidence of high-grade spinal canal stenosis. Upper chest: The imaged lung apices are clear. A right chest wall port is partially imaged. Other: None. IMPRESSION: 1. No acute intracranial hemorrhage or calvarial fracture. 2. 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 3. No acute facial bone fracture. 4. No acute fracture or traumatic malalignment of the cervical spine. 5. Extensive sclerotic osseous metastatic disease. Electronically Signed   By: Lesia Hausen M.D.   On: 12/04/2022 17:12   CT Maxillofacial Wo Contrast  Result Date: 12/04/2022 CLINICAL DATA:  Trauma, pain EXAM: CT HEAD WITHOUT CONTRAST CT MAXILLOFACIAL WITHOUT CONTRAST CT CERVICAL SPINE WITHOUT CONTRAST TECHNIQUE: Multidetector CT imaging of the head, cervical spine, and maxillofacial structures were performed using the standard protocol without intravenous contrast. Multiplanar CT image reconstructions of the cervical spine and maxillofacial structures were also generated. RADIATION DOSE REDUCTION: This exam was performed according to the departmental dose-optimization program which includes automated exposure control, adjustment of the mA and/or kV according to patient size and/or use of iterative reconstruction technique. COMPARISON:  None Available. FINDINGS: CT HEAD FINDINGS Brain: There is no acute intracranial hemorrhage, extra-axial fluid collection, or acute territorial infarct. Parenchymal volume is normal. The ventricles are normal in size. There is an age-indeterminate  small infarct in the right basal ganglia/corona radiata. The pituitary and suprasellar region are normal. There is no mass lesion. There is no mass effect or midline shift. Vascular: No hyperdense vessel or unexpected calcification. Skull: There is osseous metastatic disease in the clivus and bilateral sphenoid bones. Other: The mastoid air cells and middle ear cavities are clear. CT MAXILLOFACIAL FINDINGS Osseous: There is no acute facial bone fracture. There is no evidence of mandibular dislocation. There is abnormal sclerosis in the bilateral sphenoid bones consistent with osseous metastatic disease. No lytic destructive lesion is seen in the facial bones. Orbits: The globes and orbits are unremarkable. Sinuses: There is a small retention cyst in the left maxillary sinus. Soft tissues: Unremarkable. CT CERVICAL SPINE FINDINGS Alignment: There is mild anterolisthesis of C3 on C4 and retrolisthesis of C4 on C5, likely degenerative. There is no evidence of traumatic malalignment. Skull base and vertebrae: Skull base alignment is maintained. Vertebral body heights are preserved. There is no evidence of acute fracture. There is extensive sclerotic osseous metastatic disease. Soft tissues and spinal canal: No prevertebral fluid or swelling. No visible canal hematoma. Disc levels: There is moderate disc space narrowing and degenerative endplate change at C4-C5 through C6-C7 and moderate left facet arthropathy at C3-C4 there is no evidence  of high-grade spinal canal stenosis. Upper chest: The imaged lung apices are clear. A right chest wall port is partially imaged. Other: None. IMPRESSION: 1. No acute intracranial hemorrhage or calvarial fracture. 2. 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 3. No acute facial bone fracture. 4. No acute fracture or traumatic malalignment of the cervical spine. 5. Extensive sclerotic osseous metastatic disease. Electronically Signed   By: Lesia Hausen  M.D.   On: 12/04/2022 17:12    Disposition:   Discharge Instructions     Discontinue IV   Complete by: As directed    Foley catheter - discontinue   Complete by: As directed         Follow-up Information     Donnita Falls, FNP Follow up on 01/14/2023.   Specialty: Urology Why: Kittie Plater information: [address] [place] [address] [phone]                  Signed: Bjorn Pippin 12/19/2022, 6:54 AM

## 2022-12-19 NOTE — Progress Notes (Signed)
FC discontinued as ordered at 0730

## 2022-12-20 ENCOUNTER — Ambulatory Visit: Payer: BC Managed Care – PPO | Admitting: Urology

## 2022-12-20 ENCOUNTER — Other Ambulatory Visit (HOSPITAL_COMMUNITY): Payer: Self-pay | Admitting: Hematology

## 2022-12-20 ENCOUNTER — Inpatient Hospital Stay (HOSPITAL_COMMUNITY)
Admission: RE | Admit: 2022-12-20 | Discharge: 2022-12-20 | Disposition: A | Discharge status: Home / Self Care | Payer: BC Managed Care – PPO | Source: Ambulatory Visit | Attending: Hematology | Admitting: Hematology

## 2022-12-20 DIAGNOSIS — C61 Malignant neoplasm of prostate: Secondary | ICD-10-CM

## 2022-12-20 NOTE — Addendum Note (Signed)
Encounter addended by: Donato Heinz, RT on: 12/20/2022 3:35 PM  Actions taken: Imaging Exam ended

## 2022-12-20 NOTE — Written Directive (Addendum)
  PLUVICTO  THERAPY   RADIOPHARMACEUTICAL: Lutetium 177 vipivotide tetraxetan (Pluvicto)     PRESCRIBED DOSE FOR ADMINISTRATION:  200 mCi   ROUTE OFADMINISTRATION:  IV   DIAGNOSIS:  Prostate cancer    REFERRING PHYSICIAN: Dr. Ellin Saba    TREATMENT #: 3    ADDITIONAL PHYSICIAN COMMENTS/NOTES:   AUTHORIZED USER SIGNATURE & TIME STAMP: Patriciaann Clan, MD   12/21/22    8:53 AM

## 2022-12-25 ENCOUNTER — Telehealth: Payer: Self-pay

## 2022-12-25 NOTE — Telephone Encounter (Signed)
Spoke to patient informing him that his FMLA documents had been completed and were ready to pick up at [place] location as requested. Documents sent to [facility] nurses via email 10/7, informed patient will be coming in to pick them up.

## 2022-12-28 ENCOUNTER — Other Ambulatory Visit (HOSPITAL_COMMUNITY): Payer: BC Managed Care – PPO

## 2022-12-28 ENCOUNTER — Ambulatory Visit (HOSPITAL_COMMUNITY)
Admission: RE | Admit: 2022-12-28 | Discharge: 2022-12-28 | Disposition: A | Discharge status: Home / Self Care | Payer: BC Managed Care – PPO | Source: Ambulatory Visit | Attending: Hematology | Admitting: Hematology

## 2022-12-28 VITALS — BP 136/59 | HR 68

## 2022-12-28 DIAGNOSIS — C7951 Secondary malignant neoplasm of bone: Secondary | ICD-10-CM | POA: Diagnosis present

## 2022-12-28 DIAGNOSIS — C61 Malignant neoplasm of prostate: Secondary | ICD-10-CM | POA: Diagnosis present

## 2022-12-28 LAB — URINALYSIS, COMPLETE (UACMP) WITH MICROSCOPIC
Bacteria, UA: NONE SEEN
Bilirubin Urine: NEGATIVE
Glucose, UA: NEGATIVE mg/dL
Ketones, ur: NEGATIVE mg/dL
Leukocytes,Ua: NEGATIVE
Nitrite: POSITIVE — AB
Protein, ur: 100 mg/dL — AB
RBC / HPF: >50 RBC/hpf (ref 0–5)
Specific Gravity, Urine: 1.019 (ref 1.005–1.030)
pH: 5 (ref 5.0–8.0)

## 2022-12-28 MED ORDER — SODIUM CHLORIDE 0.9 % IV SOLN: INTRAVENOUS | Status: AC

## 2022-12-28 MED ORDER — PALONOSETRON HCL INJECTION 0.25 MG/5ML
0.2500 mg | Freq: Once | INTRAVENOUS | Status: AC
  Administered 2022-12-28: 0.25 mg via INTRAVENOUS
  Filled 2022-12-28: qty 5

## 2022-12-28 MED ORDER — HYDROCODONE-ACETAMINOPHEN 5-325 MG PO TABS
ORAL_TABLET | ORAL | Status: AC
  Administered 2022-12-28: 1 via ORAL
  Filled 2022-12-28: qty 1

## 2022-12-28 MED ORDER — LUTETIUM LU 177 VIPIVOTIDE TET 1000 MBQ/ML IV SOLN
203.0000 | Freq: Once | INTRAVENOUS | Status: AC
  Administered 2022-12-28: 203 via INTRAVENOUS

## 2022-12-28 MED ORDER — HYDROCODONE-ACETAMINOPHEN 5-325 MG PO TABS: 1.0000 | ORAL_TABLET | Freq: Once | ORAL | Status: AC

## 2022-12-28 MED ORDER — SODIUM CHLORIDE 0.9 % IV BOLUS: 1000.0000 mL | Freq: Once | INTRAVENOUS | Status: DC

## 2022-12-28 MED ORDER — SODIUM CHLORIDE 0.9 % IV SOLN
Freq: Once | INTRAVENOUS | Status: AC
  Filled 2022-12-28: qty 5

## 2022-12-28 NOTE — Progress Notes (Signed)
Tolerated treatment well.  VS WNL

## 2022-12-29 ENCOUNTER — Other Ambulatory Visit: Payer: Self-pay

## 2022-12-31 ENCOUNTER — Telehealth: Payer: Self-pay

## 2022-12-31 NOTE — Telephone Encounter (Signed)
Patient is requesting his work note be extended.  He has a UTI now and states "he does not feel fully recovered"  He is requesting being written out until 01/14/2023 when he is scheduled to follow up.  Ok to provide work note?

## 2023-01-01 NOTE — Progress Notes (Signed)
[facility] [address], [place] [address]    Clinic Day:  01/02/2023  Referring physician: Etta Grandchild, MD  Patient Care Team: Etta Grandchild, MD as PCP - General (Internal Medicine) Doreatha Massed, MD as Consulting Physician (Hematology and Oncology) Jena Gauss Gerrit Friends, MD as Consulting Physician (Gastroenterology) Therese Sarah, RN as Oncology Nurse Navigator (Medical Oncology)   ASSESSMENT & PLAN:   Assessment: 1.  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  Prominent left hydronephrosis and hydroureter noted.  New sclerotic lesion in the left anterior vertebral body of L1.  Mild left periaortic adenopathy increased from prior, short axis lymph node measuring 1 cm. -Last PSA increased to 15.8 on 09/28/2019. -As there is rapid progression of disease, I have recommended docetaxel chemotherapy.  He is agreeable after long discussion. -Cystoscopy with transurethral resection of tumor in the posterior bladder neck, right and left walls of the bladder, right lobe of the prostate on 11/10/2019. -Plan was to do 6 cycles of docetaxel followed by abiraterone and prednisone. -6 cycles of docetaxel from 11/16/2019 through 02/29/2020. -CTAP on 01/06/2020 showed persistent thickening of the urinary bladder wall.  Left hydronephrosis is significantly reduced compared to prior exam.  Left-sided retroperitoneal and left iliac lymph nodes reduced in size.  Interval increase in sclerosis of lesions of the left aspect of L2 vertebral body consistent with treatment response.  No new bone lesions. -Bone scan on 01/06/2020 shows stable L2 metastatic focus. -Bone scan scan images from 06/14/2020 which showed progressive metastatic disease, with increased size and intensity of L2 1 new focus of activity at  L1. 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 -Abiraterone and prednisone from 06/24/2020 through 09/06/2021 with progression. - PSMA PET scan (09/28/2021): Residual/recurrent tumor involving prostate gland and seminal vesicles.  Tracer avid bone metastasis localized L1 and L2 vertebral bodies.  No new sites of bone metastasis disease.  No solid organ or nodal mets. - Darolutamide from 10/17/2021 through 03/15/2022 with progression - Guardant360: MSI high not detected.  Androgen resistance present. - XRT to L2-L4, 30 Gray in 10 fractions completed on 04/23/2022 with improvement in pain. - Cabazitaxel 4 cycles from 05/07/2022 through 07/11/2022 with progression. - Pluvicto cycle 1 on 09/26/2022, cycle 2 on 11/08/2022, cycle 3 on 12/28/2022 - TURP on 12/18/2022 by Dr. Annabell Howells   2.  Left hydroureteronephrosis: -CT scan showed prostate mass invading the posterior bladder and obstructing the ureter.  Creatinine increased to 1.1. -Renal ultrasound on 12/11/2019 showed interval resolution of previously seen left-sided hydronephrosis.  Previously seen soft tissue mass at the posterior bladder lumen no longer seen.    Plan: 1.  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 - He reports decrease in energy and more sleepiness.  He is also having mood changes per wife. - He was diagnosed with UTI and Bactrim was started yesterday. - Last PSA was 734 on 12/06/2022, improvement. - Reviewed labs today: Alk phos improved to 498.  ALT minimally elevated at 63 and albumin 3.2.  Creatinine was normal.  CBC was grossly normal with mild anemia.  Sodium was low at 126.  PSA from today is pending. - He was told to increase sodium intake. - We will check his labs again during the week of 01/28/2023 along with  a PSA.   2.  Hot flashes: - Continue estradiol twice daily for hot flashes.   3.  Nausea/vomiting: - He is doing well with nausea and vomiting after premeds were added.  He is  also taking dexamethasone 4 mg daily for 3 days after each treatment which is helping.   4.  Lower back pain/bilateral hip pains: - He is currently taking Norco 5/325 every 6 hours.  Pain is not well-controlled. - Will increase Norco 10/325 every 6 hours as needed.  5.  Appetite: - He is taking Megace once daily which is helping.  Orders Placed This Encounter  Procedures   CBC with Differential    Standing Status:   Future    Number of Occurrences:   1    Standing Expiration Date:   01/02/2024   Comprehensive metabolic panel    Standing Status:   Future    Number of Occurrences:   1    Standing Expiration Date:   01/02/2024   PSA    Standing Status:   Future    Number of Occurrences:   1    Standing Expiration Date:   01/02/2024      I,Katie Daubenspeck,acting as a scribe for Doreatha Massed, MD.,have documented all relevant documentation on the behalf of Doreatha Massed, MD,as directed by  Doreatha Massed, MD while in the presence of Doreatha Massed, MD.   I, Doreatha Massed MD, have reviewed the above documentation for accuracy and completeness, and I agree with the above.   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    Prior Therapy: 1. TURP on 11/10/2019. 2. Docetaxel x 6 cycles, 11/16/2019 - 02/29/2020. 3.  Abiraterone and prednisone, 06/24/20 - 09/06/21 4.  Darolutamide, 10/17/21 - 03/15/22  Current Therapy:  Cabazitaxel every 21 days    HISTORY OF PRESENT ILLNESS:   Oncology History  Prostate cancer metastatic to multiple sites [facility])  01/25/2017 Initial Diagnosis   Prostate cancer metastatic to multiple sites [facility])   11/16/2019 - 03/02/2020 Chemotherapy   Patient is on Treatment Plan : PROSTATE Docetaxel + Prednisone q21d      Genetic Testing   Negative genetic testing. No  pathogenic variants identified on the [facility] CancerNext-Expanded panel. 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 The report date is 04/19/2020.  The CancerNext-Expanded  gene panel offered by [facility] and includes sequencing and rearrangement analysis for the following 77 genes: IP, ALK, APC*, ATM*, AXIN2, BAP1, BARD1, BLM, BMPR1A, BRCA1*, BRCA2*, BRIP1*, CDC73, CDH1*,CDK4, CDKN1B, CDKN2A, CHEK2*, CTNNA1, DICER1, FANCC, FH, FLCN, GALNT12, KIF1B, LZTR1, MAX, MEN1, MET, MLH1*, MSH2*, MSH3, MSH6*, MUTYH*, NBN, NF1*, NF2, NTHL1, PALB2*, PHOX2B, PMS2*, POT1, PRKAR1A, PTCH1, PTEN*, RAD51C*, RAD51D*,RB1, RECQL, RET, SDHA, SDHAF2, SDHB, SDHC, SDHD, SMAD4, SMARCA4, SMARCB1, SMARCE1, STK11, SUFU, TMEM127, TP53*,TSC1, TSC2, VHL and XRCC2 (sequencing and deletion/duplication); EGFR, EGLN1, HOXB13, KIT, MITF, PDGFRA, POLD1 and POLE (sequencing only); EPCAM and GREM1 (deletion/duplication only).    05/07/2022 -  Chemotherapy   Patient is on Treatment Plan : PROSTATE Cabazitaxel (20) D1 + Prednisone D1-21 q21d        INTERVAL HISTORY:   Scott [name] is a 67 y.o. 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 He was last seen by NP Victorino Dike on 12/06/22.  Since his last visit, he underwent repeat TURP on 12/18/22. Pathology  showed: prostatic adenocarcinoma without showing significant treatment effect, Gleason 5+4 involves 90% of resected tissue.  Today, he states that he is doing well overall. His appetite level is at 80%. His energy level is at 30%.  PAST MEDICAL HISTORY:   Past Medical History: Past Medical History:  Diagnosis Date   Anemia    Bladder obstruction    Complication of anesthesia    Diverticulitis 2015   Dyspnea    Essential hypertension, benign 01/26/2019   at doctor's office elevated, normal at home, no medications at this time   GERD (gastroesophageal reflux disease)    History of basal cell cancer    HLD (hyperlipidemia)  01/26/2019   Hypothyroidism    history of, not currently taking medication   PONV (postoperative nausea and vomiting)    Prostate cancer (HCC)    metastatic to bones   Vitamin D deficiency disease 01/26/2019    Surgical History: Past Surgical History:  Procedure Laterality Date   CYSTOSCOPY W/ URETERAL STENT PLACEMENT Left 11/10/2019   Procedure: CYSTOSCOPY;  Surgeon: Bjorn Pippin, MD;  Location: [facility];  Service: Urology;  Laterality: Left;   ESOPHAGOGASTRODUODENOSCOPY (EGD) WITH PROPOFOL N/A 07/07/2020   Procedure: ESOPHAGOGASTRODUODENOSCOPY (EGD) WITH PROPOFOL;  Surgeon: Corbin Ade, MD;  Location: [facility];  Service: Endoscopy;  Laterality: N/A;  pm appt   HERNIA REPAIR  1995   UMBILICAL    IR FLUORO GUIDED NEEDLE PLC ASPIRATION/INJECTION LOC  04/09/2022   IR IMAGING GUIDED PORT INSERTION  10/11/2022   MALONEY DILATION N/A 07/07/2020   Procedure: Elease Hashimoto DILATION;  Surgeon: Corbin Ade, MD;  Location: [facility];  Service: Endoscopy;  Laterality: N/A;   PROSTATE SURGERY     SKIN CANCER EXCISION     TONSILLECTOMY Bilateral    TOOTH EXTRACTION     front left tooth pulled but no surgery or stitches   TRANSURETHRAL RESECTION OF BLADDER TUMOR N/A 12/18/2022   Procedure: REMOVAL OF BLADDER STONES;  Surgeon: Bjorn Pippin, MD;  Location: [facility];  Service: Urology;  Laterality: N/A;  1 HR FOR CASE   TRANSURETHRAL RESECTION OF PROSTATE N/A 01/25/2017   Procedure: TRANSURETHRAL RESECTION OF THE PROSTATE (TURP);  Surgeon: Sebastian Ache, MD;  Location: [facility];  Service: Urology;  Laterality: N/A;   TRANSURETHRAL RESECTION OF PROSTATE N/A 11/10/2019   Procedure: TRANSURETHRAL RESECTION OF THE PROSTATE (TURP);  Surgeon: Bjorn Pippin, MD;  Location: [facility];  Service: Urology;  Laterality: N/A;   TRANSURETHRAL RESECTION OF PROSTATE N/A 12/18/2022   Procedure: TRANSURETHRAL RESECTION OF THE PROSTATE (TURP);  Surgeon: Bjorn Pippin, MD;  Location: [facility];  Service: Urology;  Laterality: N/A;    WISDOM TOOTH EXTRACTION      Social History: Social History   Socioeconomic History   Marital status: Married    Spouse name: Not on file   Number of children: 1   Years of education: Not on file   Highest education level: Bachelor's degree (e.g., BA, AB, BS)  Occupational History    Comment: working full time  Tobacco Use   Smoking status: Former    Current packs/day: 0.00    Average packs/day: 1.5 packs/day for 35.0 years (52.5 ttl pk-yrs)    Types: Cigarettes    Start date: 01/11/1969    Quit date: 01/12/2004    Years since quitting: 18.9   Smokeless tobacco: Never   Tobacco comments:    OVER 30 YEARS HX OF SMOKING   Vaping Use   Vaping status: Never Used  Substance and Sexual Activity   Alcohol use: Not Currently    Comment: heavy drinker until 2004   Drug use: Not Currently    Types: Marijuana   Sexual activity: Not Currently  Other Topics Concern   Not on file  Social History Narrative   Married for 19 years.Works for [employer] from home.   Social Determinants of Health   Financial Resource Strain: Low Risk  (08/24/2022)   Overall Financial Resource Strain (CARDIA)    Difficulty of Paying Living Expenses: Not very hard  Food Insecurity: No Food Insecurity (12/18/2022)   Hunger Vital Sign    Worried About Running Out of Food in the Last Year: Never true    Ran Out of Food in the Last Year: Never true  Transportation Needs: No Transportation Needs (12/18/2022)   PRAPARE - Administrator, Civil Service (Medical): No    Lack of Transportation (Non-Medical): No  Physical Activity: Insufficiently Active (08/24/2022)   Exercise Vital Sign    Days of Exercise per Week: 5 days    Minutes of Exercise per Session: 20 min  Stress: Stress Concern Present (08/24/2022)   [employer] of Occupational Health - Occupational Stress Questionnaire    Feeling of Stress : To some extent  Social Connections: Socially Isolated (08/24/2022)   Social Connection  and Isolation Panel [NHANES]    Frequency of Communication with Friends and Family: Once a week    Frequency of Social Gatherings with Friends and Family: Once a week    Attends Religious Services: Never    Database administrator or Organizations: No    Attends Engineer, structural: Not on file    Marital Status: Married  Catering manager Violence: Not At Risk (12/18/2022)   Humiliation, Afraid, Rape, and Kick questionnaire    Fear of Current or Ex-Partner: No    Emotionally Abused: No    Physically Abused: No    Sexually Abused: No    Family History: Family History  Problem Relation Age of Onset   Emphysema Mother    Alzheimer's disease Father    Prostate cancer Father        dx in his 19s   Prostate cancer Brother        dx in his 51s   Breast cancer Maternal Grandmother        60s   Pancreatic cancer Maternal Grandfather        37s   Dementia Paternal Grandmother    Heart attack Paternal Grandfather    Colon cancer Neg Hx     Current Medications:  Current Outpatient Medications:    Cholecalciferol 125 MCG (5000 UT) capsule, Take 10,000 Units by mouth daily., Disp: , Rfl:    dexamethasone (DECADRON) 4 MG tablet, Take 1 tablet (4 mg total) by mouth daily. Take for three days after each Pluvicto treatment, Disp: 30 tablet, Rfl: 0   docusate sodium (COLACE) 100 MG capsule, Take 100 mg by mouth daily., Disp: , Rfl:    dronabinol (MARINOL) 5 MG capsule, TAKE 1 CAPSULE(5 MG) BY MOUTH TWICE DAILY BEFORE A MEAL, Disp: 60 capsule, Rfl: 1   dutasteride (AVODART) 0.5 MG capsule, TAKE 1 CAPSULE(0.5 MG) BY MOUTH DAILY, Disp: 90 capsule, Rfl: 3   Elastic Bandages & Supports (B-4 MED COMPRESSION HOSE MENS) MISC, Wear as needed when awake to help prevent leg swelling, Disp: 2 each, Rfl: 0   estradiol (VIVELLE-DOT) 0.1 MG/24HR patch, APPLY 1 PATCH(0.1 MG) TOPICALLY TO THE SKIN  2 TIMES A WEEK, Disp: 8 patch, Rfl: 12   HYDROcodone-acetaminophen (NORCO) 10-325 MG tablet, Take 1  tablet by mouth every 6 (six) hours as needed., Disp: 120 tablet, Rfl: 0   MAGNESIUM PO, Take 1 tablet by mouth at bedtime., Disp: , Rfl:    megestrol (MEGACE) 40 MG/ML suspension, SHAKE LIQUID AND TAKE 10 ML(400 MG) BY MOUTH TWICE DAILY, Disp: 480 mL, Rfl: 0   MELATONIN PO, Take 40 mg by mouth at bedtime., Disp: , Rfl:    metoprolol tartrate (LOPRESSOR) 25 MG tablet, Take 0.5 tablets (12.5 mg total) by mouth 2 (two) times daily., Disp: 30 tablet, Rfl: 1   Multiple Vitamin (MULTIVITAMIN WITH MINERALS) TABS tablet, Take 1 tablet by mouth daily., Disp: , Rfl:    NATTOKINASE PO, Take 2 capsules by mouth daily., Disp: , Rfl:    NP THYROID 120 MG tablet, Take 0.5 tablets (60 mg total) by mouth every morning., Disp: 45 tablet, Rfl: 1   olmesartan (BENICAR) 20 MG tablet, TAKE 1 TABLET(20 MG) BY MOUTH DAILY, Disp: 90 tablet, Rfl: 1   ondansetron (ZOFRAN ODT) 8 MG disintegrating tablet, Take 1 tablet (8 mg total) by mouth every 8 (eight) hours as needed for nausea or vomiting., Disp: 60 tablet, Rfl: 3   phenazopyridine (PYRIDIUM) 200 MG tablet, Take 1 tablet (200 mg total) by mouth 3 (three) times daily., Disp: 45 tablet, Rfl: 0   prochlorperazine (COMPAZINE) 10 MG tablet, Take 10 mg by mouth daily., Disp: , Rfl:    prochlorperazine (COMPAZINE) 25 MG suppository, Place 1 suppository (25 mg total) rectally every 8 (eight) hours as needed for nausea or vomiting., Disp: 24 suppository, Rfl: 3   pyridOXINE (VITAMIN B6) 25 MG tablet, Take 25 mg by mouth daily., Disp: , Rfl:    sulfamethoxazole-trimethoprim (BACTRIM DS) 800-160 MG tablet, Take 1 tablet by mouth 2 (two) times daily., Disp: , Rfl:    thiamine (VITAMIN B-1) 50 MG tablet, Take 1 tablet (50 mg total) by mouth daily., Disp: 90 tablet, Rfl: 1   traMADol (ULTRAM) 50 MG tablet, Take 2 tablets (100 mg total) by mouth every 6 (six) hours as needed., Disp: 120 tablet, Rfl: 0   vitamin E 180 MG (400 UNITS) capsule, Take 400 Units by mouth daily., Disp: , Rfl:     VITAMIN K PO, Take 1 capsule by mouth daily., Disp: , Rfl:  No current facility-administered medications for this visit.  Facility-Administered Medications Ordered in Other Visits:    sodium chloride 0.9 % bolus 1,000 mL, 1,000 mL, Intravenous, Once, Signa Kell, MD   Allergies: Allergies  Allergen Reactions   Crestor [Rosuvastatin] Other (See Comments)    myalgia   Ivp Dye [Iodinated Contrast Media] Swelling    Swelling on head, took Benadryl and swelling reduced   Wellbutrin [Bupropion] Hives    REVIEW OF SYSTEMS:   Review of Systems  Constitutional:  Negative for chills, fatigue and fever.  HENT:   Negative for lump/mass, mouth sores, nosebleeds, sore throat and trouble swallowing.   Eyes:  Negative for eye problems.  Respiratory:  Positive for shortness of breath. Negative for cough.   Cardiovascular:  Negative for chest pain, leg swelling and palpitations.  Gastrointestinal:  Positive for constipation. Negative for abdominal pain, diarrhea, nausea and vomiting.  Genitourinary:  Positive for dysuria. Negative for bladder incontinence, difficulty urinating, frequency, hematuria and nocturia.   Musculoskeletal:  Positive for back pain. Negative for arthralgias, flank pain, myalgias and neck pain.  Skin:  Negative for  itching and rash.  Neurological:  Positive for numbness. Negative for dizziness and headaches.  Hematological:  Does not bruise/bleed easily.  Psychiatric/Behavioral:  Negative for depression, sleep disturbance and suicidal ideas. The patient is not nervous/anxious.   All other systems reviewed and are negative.    VITALS:   Blood pressure 124/86, pulse 83, temperature 97.6 F (36.4 C), temperature source Oral, resp. rate 16, weight 150 lb 14.4 oz (68.4 kg), SpO2 98%.  Wt Readings from Last 3 Encounters:  01/02/23 150 lb 14.4 oz (68.4 kg)  12/18/22 147 lb (66.7 kg)  12/13/22 147 lb (66.7 kg)    Body mass index is 24.36 kg/m.  Performance status  (ECOG): 1 - Symptomatic but completely ambulatory  PHYSICAL EXAM:   Physical Exam Vitals and nursing note reviewed. Exam conducted with a chaperone present.  Constitutional:      Appearance: Normal appearance.  Cardiovascular:     Rate and Rhythm: Normal rate and regular rhythm.     Pulses: Normal pulses.     Heart sounds: Normal heart sounds.  Pulmonary:     Effort: Pulmonary effort is normal.     Breath sounds: Normal breath sounds.  Abdominal:     Palpations: Abdomen is soft. There is no hepatomegaly, splenomegaly or mass.     Tenderness: There is no abdominal tenderness.  Musculoskeletal:     Right lower leg: Edema present.     Left lower leg: Edema present.  Lymphadenopathy:     Cervical: No cervical adenopathy.     Right cervical: No superficial, deep or posterior cervical adenopathy.    Left cervical: No superficial, deep or posterior cervical adenopathy.     Upper Body:     Right upper body: No supraclavicular or axillary adenopathy.     Left upper body: No supraclavicular or axillary adenopathy.  Neurological:     General: No focal deficit present.     Mental Status: He is alert and oriented to person, place, and time.  Psychiatric:        Mood and Affect: Mood normal.        Behavior: Behavior normal.     LABS:      Latest Ref Rng & Units 01/02/2023   10:47 AM 12/11/2022    2:50 PM 12/06/2022    2:24 PM  CBC  WBC 4.0 - 10.5 K/uL 8.6  6.8  6.2   Hemoglobin 13.0 - 17.0 g/dL 56.2  13.0  86.5   Hematocrit 39.0 - 52.0 % 30.4  31.4  29.2   Platelets 150 - 400 K/uL 277  258  237       Latest Ref Rng & Units 01/02/2023   10:47 AM 12/11/2022    2:50 PM 12/06/2022    2:24 PM  CMP  Glucose 70 - 99 mg/dL 93  91  99   BUN 8 - 23 mg/dL 14  16  13    Creatinine 0.61 - 1.24 mg/dL 7.84  6.96  2.95   Sodium 135 - 145 mmol/L 126  132  130   Potassium 3.5 - 5.1 mmol/L 3.4  4.3  4.1   Chloride 98 - 111 mmol/L 98  104  104   CO2 22 - 32 mmol/L 18  21  18    Calcium 8.9 -  10.3 mg/dL 7.8  8.5  8.0   Total Protein 6.5 - 8.1 g/dL 5.8   5.9   Total Bilirubin 0.3 - 1.2 mg/dL 0.7   0.6   Alkaline Phos  38 - 126 U/L 498   741   AST 15 - 41 U/L 39   26   ALT 0 - 44 U/L 63   37      No results found for: "CEA1", "CEA" / No results found for: "CEA1", "CEA" Lab Results  Component Value Date   PSA1 12.9 (H) 05/26/2020   No results found for: "EAV409" No results found for: "CAN125"  No results found for: "TOTALPROTELP", "ALBUMINELP", "A1GS", "A2GS", "BETS", "BETA2SER", "GAMS", "MSPIKE", "SPEI" Lab Results  Component Value Date   TIBC 336.0 01/09/2021   TIBC 280 04/20/2020   FERRITIN 69.8 01/09/2021   FERRITIN 45 04/20/2020   IRONPCTSAT 45.2 01/09/2021   IRONPCTSAT 31 04/20/2020   No results found for: "LDH"   STUDIES:   NM PLUVICTO ADMINISTRATION  Result Date: 12/28/2022 CLINICAL DATA:  67 year old male with castrate resistant metastatic prostate carcinoma. Patient presents for third treatment. EXAM: NUCLEAR MEDICINE PLUVICTO INJECTION TECHNIQUE: Infusion: The nuclear medicine technologist verified the dose activity to be delivered as specified in the written directive, and verified the patient identification via 2 separate methods. Initial flush of the intravenous catheter was performed was sterile saline. The dose syringe was connected to the catheter and the Lu-177 Pluvicto administered over a 1 to 10 min infusion. Single 10 cc lushes with normal saline follow the dose. No complications were noted. The entire IV tubing, venocatheter, stopcock and syringes was removed in total, placed in a disposal bag and sent for assay of the residual activity, which will be reported at a later time in our EMR by the physics staff. Pressure was applied to the venipuncture site, and a compression bandage placed. Patient monitored for 1 hour following infusion while receiving IV normal saline. Radiation Safety personnel were present to perform the discharge survey, as detailed  on their documentation. After a short period of observation, the patient had his IV removed. RADIOPHARMACEUTICALS:  203 microcuries Lu-177 PLUVICTO FINDINGS: Current Infusion: 3 Planned Infusions: 6 Patient presented to nuclear medicine for treatment. The patient's most recent blood counts were reviewed and remains a good candidate to proceed with Lu-177 Pluvicto. The patient was situated in an infusion suite with a contact barrier placed under the arm. Intravenous access was established, using sterile technique, and a normal saline infusion from a syringe was started. Micro-dosimetry: The prescribed radiation activity was assayed and confirmed to be within specified tolerance. IMPRESSION: Current Infusion: 3 Planned Infusions: 6 The patient tolerated the infusion well. The patient will return in six months for ongoing care. Electronically Signed   By: Signa Kell M.D.   On: 12/28/2022 16:56   MR PELVIS W WO CONTRAST  Result Date: 12/04/2022 CLINICAL DATA:  Bladder mass, status post TURP, history of prostate cancer EXAM: MRI PELVIS WITHOUT AND WITH CONTRAST TECHNIQUE: Multiplanar multisequence MR imaging of the pelvis was performed both before and after administration of intravenous contrast. CONTRAST:  7mL GADAVIST GADOBUTROL 1 MMOL/ML IV SOLN COMPARISON:  CT abdomen/pelvis dated 12/04/2022 FINDINGS: Urinary Tract: Bladder is mildly thick-walled although underdistended. Soft tissue along the bladder base, described below. Bowel:  Sigmoid diverticulosis, without evidence of diverticulitis. Vascular/Lymphatic: No evidence of abdominal aortic aneurysm. No suspicious abdominal lymphadenopathy. 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 Additional enhancing tumor involving the base of the seminal vesicles (series 8/image 51). Other:  Trace presacral fluid. Musculoskeletal:  Multifocal osseous metastases. IMPRESSION: Status post TURP with  enhancing soft tissue indenting the bladder base, suggesting tumor in this patient with known prostatic adenocarcinoma. If there is concern for primary bladder carcinoma, consider cystoscopy. Additional enhancing tumor involving the base of the seminal vesicles. Multifocal osseous metastases. Electronically Signed   By: Charline Bills M.D.   On: 12/04/2022 20:14   CT ABDOMEN PELVIS WO CONTRAST  Result Date: 12/04/2022 CLINICAL DATA:  Bladder dysfunction. History of metastatic prostate cancer. Concern for bladder mass. * Tracking Code: BO * EXAM: CT ABDOMEN AND PELVIS WITHOUT CONTRAST TECHNIQUE: Multidetector CT imaging of the abdomen and pelvis was performed following the standard protocol without IV contrast. RADIATION DOSE REDUCTION: This exam was performed according to the departmental dose-optimization program which includes automated exposure control, adjustment of the mA and/or kV according to patient size and/or use of iterative reconstruction technique. COMPARISON:  CT 07/26/2022 FINDINGS: Lower chest: 4 mm subpleural left lower lobe lung nodule on image 40 of series 4. This measured 3 mm previously, similar. There is some bilateral lung base areas of atelectasis. Breathing motion. Stable 2-3 mm nodule subpleural right lower lobe series 4, image 17 as well. No pleural effusion. Gynecomastia. Hepatobiliary: Stones in the nondilated gallbladder. On this non IV contrast exam, no clear space-occupying liver lesion. Evaluation for solid organ pathology is limited without the advantage of IV contrast. Pancreas: Global mild atrophy of the pancreas particularly the head neck and body region. Unchanged from previous. Spleen: Normal in size without focal abnormality. Adrenals/Urinary Tract: Punctate calcification along the right adrenal gland. Stable. Normal left adrenal. No abnormal calcification seen within either kidney nor along the course of  either ureter. Slight ectasia of the distal ureters, nonspecific. Preserved contours of the urinary bladder with some wall thickening and stranding. Stomach/Bowel: There is fluid and debris along the stomach. Large bowel has a normal course and caliber with moderate colonic stool. Few distal colonic diverticula. The appendix is grossly preserved in the right lower quadrant and is nondilated. Small bowel is nondilated. Vascular/Lymphatic: Aortic atherosclerosis. No enlarged abdominal or pelvic lymph nodes. Reproductive: Lobular prostate with TURP defect and calcifications. Previously there was a enhancing nodule along the margin of the TURP defect inferiorly. This has less well defined without the advantage of IV contrast but the contour deformity is still present on series 2, image 74. Additional evaluation as clinically appropriate. MRI may be of some benefit as an option Other: Nonspecific presacral fat stranding, increased from previous. Small bilateral fat containing inguinal hernias. Epigastric midline anterior abdominal wall moderate fat containing hernia. The fat in the hernia sac is with some stranding. This was seen previously. Musculoskeletal: Extensive sclerotic bone metastases again identified. Comparing current to prior the extent distribution of the areas is markedly increased throughout the pelvis, proximal femurs, spine and ribs. There is also sternal lesions. 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 Nodular prostate with mass effect along the bladder. Stable bladder wall thickening and stranding. The nodular area in the area of the TURP defect of the prostate on the prior is suggested again today but less well seen without the advantage of IV contrast. Additional workup is recommended as clinically appropriate such as MRI. Increasing presacral fat stranding. Gallstones. Gynecomastia. Tiny lung nodules.  Additional follow up as per the patient's neoplasm. Electronically Signed   By: Karen Kays M.D.   On: 12/04/2022 17:52   CT HEAD WO CONTRAST ( )  Result Date: 12/04/2022 CLINICAL DATA:  Trauma, pain EXAM: CT HEAD WITHOUT CONTRAST CT MAXILLOFACIAL WITHOUT CONTRAST CT CERVICAL SPINE WITHOUT CONTRAST TECHNIQUE: Multidetector CT imaging of the head, cervical spine, and maxillofacial structures were performed using the standard protocol without intravenous contrast. Multiplanar CT image reconstructions of the cervical spine and maxillofacial structures were also generated. RADIATION DOSE REDUCTION: This exam was performed according to the departmental dose-optimization program which includes automated exposure control, adjustment of the mA and/or kV according to patient size and/or use of iterative reconstruction technique. COMPARISON:  None Available. FINDINGS: CT HEAD FINDINGS Brain: There is no acute intracranial hemorrhage, extra-axial fluid collection, or acute territorial infarct. Parenchymal volume is normal. The ventricles are normal in size. There is an age-indeterminate small infarct in the right basal ganglia/corona radiata. The pituitary and suprasellar region are normal. There is no mass lesion. There is no mass effect or midline shift. Vascular: No hyperdense vessel or unexpected calcification. Skull: There is osseous metastatic disease in the clivus and bilateral sphenoid bones. Other: The mastoid air cells and middle ear cavities are clear. CT MAXILLOFACIAL FINDINGS Osseous: There is no acute facial bone fracture. There is no evidence of mandibular dislocation. There is abnormal sclerosis in the bilateral sphenoid bones consistent with osseous metastatic disease. No lytic destructive lesion is seen in the facial bones. Orbits: The globes and orbits are unremarkable. Sinuses: There is a small retention cyst in the left maxillary sinus. Soft tissues: Unremarkable. CT CERVICAL SPINE FINDINGS  Alignment: There is mild anterolisthesis of C3 on C4 and retrolisthesis of C4 on C5, likely degenerative. There is no evidence of traumatic malalignment. Skull base and vertebrae: Skull base alignment is maintained. Vertebral body heights are preserved. There is no evidence of acute fracture. There is extensive sclerotic osseous metastatic disease. Soft tissues and spinal canal: No prevertebral fluid or swelling. No visible canal hematoma. Disc levels: There is moderate disc space narrowing and degenerative endplate change at C4-C5 through C6-C7 and moderate left facet arthropathy at C3-C4 there is no evidence of high-grade spinal canal stenosis. Upper chest: The imaged lung apices are clear. A right chest wall port is partially imaged. Other: None. IMPRESSION: 1. No acute intracranial hemorrhage or calvarial fracture. 2. 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 3. No acute facial bone fracture. 4. No acute fracture or traumatic malalignment of the cervical spine. 5. Extensive sclerotic osseous metastatic disease. Electronically Signed   By: Lesia Hausen M.D.   On: 12/04/2022 17:12   CT CERVICAL SPINE WO CONTRAST  Result Date: 12/04/2022 CLINICAL DATA:  Trauma, pain EXAM: CT HEAD WITHOUT CONTRAST CT MAXILLOFACIAL WITHOUT CONTRAST CT CERVICAL SPINE WITHOUT CONTRAST TECHNIQUE: Multidetector CT imaging of the head, cervical spine, and maxillofacial structures were performed using the standard protocol without intravenous contrast. Multiplanar CT image reconstructions of the cervical spine and maxillofacial structures were also generated. RADIATION DOSE REDUCTION: This exam was performed according to the departmental dose-optimization program which includes automated exposure control, adjustment of the mA and/or kV according to patient size and/or use of iterative reconstruction technique. COMPARISON:  None Available. FINDINGS: CT HEAD FINDINGS Brain: There is no acute intracranial  hemorrhage, extra-axial fluid collection, or acute territorial infarct. Parenchymal volume is normal. The ventricles are normal in size. There is an age-indeterminate small infarct in the right basal ganglia/corona radiata. The pituitary and suprasellar region are normal. There is no mass lesion. There is no mass effect or midline shift. Vascular: No hyperdense vessel or unexpected calcification. Skull: There is osseous metastatic disease in the  clivus and bilateral sphenoid bones. Other: The mastoid air cells and middle ear cavities are clear. CT MAXILLOFACIAL FINDINGS Osseous: There is no acute facial bone fracture. There is no evidence of mandibular dislocation. There is abnormal sclerosis in the bilateral sphenoid bones consistent with osseous metastatic disease. No lytic destructive lesion is seen in the facial bones. Orbits: The globes and orbits are unremarkable. Sinuses: There is a small retention cyst in the left maxillary sinus. Soft tissues: Unremarkable. CT CERVICAL SPINE FINDINGS Alignment: There is mild anterolisthesis of C3 on C4 and retrolisthesis of C4 on C5, likely degenerative. There is no evidence of traumatic malalignment. Skull base and vertebrae: Skull base alignment is maintained. Vertebral body heights are preserved. There is no evidence of acute fracture. There is extensive sclerotic osseous metastatic disease. Soft tissues and spinal canal: No prevertebral fluid or swelling. No visible canal hematoma. Disc levels: There is moderate disc space narrowing and degenerative endplate change at C4-C5 through C6-C7 and moderate left facet arthropathy at C3-C4 there is no evidence of high-grade spinal canal stenosis. Upper chest: The imaged lung apices are clear. A right chest wall port is partially imaged. Other: None. IMPRESSION: 1. No acute intracranial hemorrhage or calvarial fracture. 2. 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 3. No acute facial bone  fracture. 4. No acute fracture or traumatic malalignment of the cervical spine. 5. Extensive sclerotic osseous metastatic disease. Electronically Signed   By: Lesia Hausen M.D.   On: 12/04/2022 17:12   CT Maxillofacial Wo Contrast  Result Date: 12/04/2022 CLINICAL DATA:  Trauma, pain EXAM: CT HEAD WITHOUT CONTRAST CT MAXILLOFACIAL WITHOUT CONTRAST CT CERVICAL SPINE WITHOUT CONTRAST TECHNIQUE: Multidetector CT imaging of the head, cervical spine, and maxillofacial structures were performed using the standard protocol without intravenous contrast. Multiplanar CT image reconstructions of the cervical spine and maxillofacial structures were also generated. RADIATION DOSE REDUCTION: This exam was performed according to the departmental dose-optimization program which includes automated exposure control, adjustment of the mA and/or kV according to patient size and/or use of iterative reconstruction technique. COMPARISON:  None Available. FINDINGS: CT HEAD FINDINGS Brain: There is no acute intracranial hemorrhage, extra-axial fluid collection, or acute territorial infarct. Parenchymal volume is normal. The ventricles are normal in size. There is an age-indeterminate small infarct in the right basal ganglia/corona radiata. The pituitary and suprasellar region are normal. There is no mass lesion. There is no mass effect or midline shift. Vascular: No hyperdense vessel or unexpected calcification. Skull: There is osseous metastatic disease in the clivus and bilateral sphenoid bones. Other: The mastoid air cells and middle ear cavities are clear. CT MAXILLOFACIAL FINDINGS Osseous: There is no acute facial bone fracture. There is no evidence of mandibular dislocation. There is abnormal sclerosis in the bilateral sphenoid bones consistent with osseous metastatic disease. No lytic destructive lesion is seen in the facial bones. Orbits: The globes and orbits are unremarkable. Sinuses: There is a small retention cyst in the  left maxillary sinus. Soft tissues: Unremarkable. CT CERVICAL SPINE FINDINGS Alignment: There is mild anterolisthesis of C3 on C4 and retrolisthesis of C4 on C5, likely degenerative. There is no evidence of traumatic malalignment. Skull base and vertebrae: Skull base alignment is maintained. Vertebral body heights are preserved. There is no evidence of acute fracture. There is extensive sclerotic osseous metastatic disease. Soft tissues and spinal canal: No prevertebral fluid or swelling. No visible canal hematoma. Disc levels: There is moderate disc space narrowing and degenerative endplate change at C4-C5 through  C6-C7 and moderate left facet arthropathy at C3-C4 there is no evidence of high-grade spinal canal stenosis. Upper chest: The imaged lung apices are clear. A right chest wall port is partially imaged. Other: None. IMPRESSION: 1. No acute intracranial hemorrhage or calvarial fracture. 2. 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 3. No acute facial bone fracture. 4. No acute fracture or traumatic malalignment of the cervical spine. 5. Extensive sclerotic osseous metastatic disease. Electronically Signed   By: Lesia Hausen M.D.   On: 12/04/2022 17:12

## 2023-01-01 NOTE — Telephone Encounter (Signed)
Work note created and pt notified via Clinical cytogeneticist

## 2023-01-02 ENCOUNTER — Inpatient Hospital Stay: Payer: BC Managed Care – PPO

## 2023-01-02 ENCOUNTER — Inpatient Hospital Stay: Payer: BC Managed Care – PPO | Attending: Hematology | Admitting: Hematology

## 2023-01-02 VITALS — BP 124/86 | HR 83 | Temp 97.6°F | Resp 16 | Wt 150.9 lb

## 2023-01-02 DIAGNOSIS — D649 Anemia, unspecified: Secondary | ICD-10-CM | POA: Diagnosis not present

## 2023-01-02 DIAGNOSIS — Z800 Family history of malignant neoplasm of digestive organs: Secondary | ICD-10-CM | POA: Insufficient documentation

## 2023-01-02 DIAGNOSIS — C779 Secondary and unspecified malignant neoplasm of lymph node, unspecified: Secondary | ICD-10-CM | POA: Insufficient documentation

## 2023-01-02 DIAGNOSIS — N136 Pyonephrosis: Secondary | ICD-10-CM | POA: Insufficient documentation

## 2023-01-02 DIAGNOSIS — Z8042 Family history of malignant neoplasm of prostate: Secondary | ICD-10-CM | POA: Diagnosis not present

## 2023-01-02 DIAGNOSIS — R232 Flushing: Secondary | ICD-10-CM | POA: Insufficient documentation

## 2023-01-02 DIAGNOSIS — R7989 Other specified abnormal findings of blood chemistry: Secondary | ICD-10-CM | POA: Diagnosis not present

## 2023-01-02 DIAGNOSIS — C7951 Secondary malignant neoplasm of bone: Secondary | ICD-10-CM | POA: Insufficient documentation

## 2023-01-02 DIAGNOSIS — M25552 Pain in left hip: Secondary | ICD-10-CM | POA: Diagnosis not present

## 2023-01-02 DIAGNOSIS — Z87891 Personal history of nicotine dependence: Secondary | ICD-10-CM | POA: Diagnosis not present

## 2023-01-02 DIAGNOSIS — M545 Low back pain, unspecified: Secondary | ICD-10-CM | POA: Insufficient documentation

## 2023-01-02 DIAGNOSIS — M25551 Pain in right hip: Secondary | ICD-10-CM | POA: Diagnosis not present

## 2023-01-02 DIAGNOSIS — Z803 Family history of malignant neoplasm of breast: Secondary | ICD-10-CM | POA: Insufficient documentation

## 2023-01-02 DIAGNOSIS — C61 Malignant neoplasm of prostate: Secondary | ICD-10-CM | POA: Insufficient documentation

## 2023-01-02 LAB — CBC WITH DIFFERENTIAL/PLATELET
Abs Immature Granulocytes: 0.3 10*3/uL — ABNORMAL HIGH (ref 0.00–0.07)
Band Neutrophils: 3 %
Basophils Absolute: 0 10*3/uL (ref 0.0–0.1)
Basophils Relative: 0 %
Eosinophils Absolute: 0.1 10*3/uL (ref 0.0–0.5)
Eosinophils Relative: 1 %
HCT: 30.4 % — ABNORMAL LOW (ref 39.0–52.0)
Hemoglobin: 10.1 g/dL — ABNORMAL LOW (ref 13.0–17.0)
Lymphocytes Relative: 16 %
Lymphs Abs: 1.4 10*3/uL (ref 0.7–4.0)
MCH: 32.4 pg (ref 26.0–34.0)
MCHC: 33.2 g/dL (ref 30.0–36.0)
MCV: 97.4 fL (ref 80.0–100.0)
Metamyelocytes Relative: 2 %
Monocytes Absolute: 0.8 10*3/uL (ref 0.1–1.0)
Monocytes Relative: 9 %
Myelocytes: 2 %
Neutro Abs: 6 10*3/uL (ref 1.7–7.7)
Neutrophils Relative %: 67 %
Platelets: 277 10*3/uL (ref 150–400)
RBC: 3.12 MIL/uL — ABNORMAL LOW (ref 4.22–5.81)
RDW: 15.7 % — ABNORMAL HIGH (ref 11.5–15.5)
WBC: 8.6 10*3/uL (ref 4.0–10.5)
nRBC: 0 % (ref 0.0–0.2)

## 2023-01-02 LAB — COMPREHENSIVE METABOLIC PANEL
ALT: 63 U/L — ABNORMAL HIGH (ref 0–44)
AST: 39 U/L (ref 15–41)
Albumin: 3.2 g/dL — ABNORMAL LOW (ref 3.5–5.0)
Alkaline Phosphatase: 498 U/L — ABNORMAL HIGH (ref 38–126)
Anion gap: 10 (ref 5–15)
BUN: 14 mg/dL (ref 8–23)
CO2: 18 mmol/L — ABNORMAL LOW (ref 22–32)
Calcium: 7.8 mg/dL — ABNORMAL LOW (ref 8.9–10.3)
Chloride: 98 mmol/L (ref 98–111)
Creatinine, Ser: 0.72 mg/dL (ref 0.61–1.24)
GFR, Estimated: >60 mL/min (ref >60)
Glucose, Bld: 93 mg/dL (ref 70–99)
Potassium: 3.4 mmol/L — ABNORMAL LOW (ref 3.5–5.1)
Sodium: 126 mmol/L — ABNORMAL LOW (ref 135–145)
Total Bilirubin: 0.7 mg/dL (ref 0.3–1.2)
Total Protein: 5.8 g/dL — ABNORMAL LOW (ref 6.5–8.1)

## 2023-01-02 LAB — PSA: Prostatic Specific Antigen: 439.81 ng/mL — ABNORMAL HIGH (ref 0.00–4.00)

## 2023-01-02 MED ORDER — HYDROCODONE-ACETAMINOPHEN 10-325 MG PO TABS: 1.0000 | ORAL_TABLET | Freq: Four times a day (QID) | ORAL | 0 refills | Status: DC | PRN

## 2023-01-02 MED ORDER — HEPARIN SOD (PORK) LOCK FLUSH 100 UNIT/ML IV SOLN
500.0000 [IU] | Freq: Once | INTRAVENOUS | Status: AC
  Administered 2023-01-02: 500 [IU] via INTRAVENOUS

## 2023-01-02 MED ORDER — SODIUM CHLORIDE 0.9% FLUSH
10.0000 mL | INTRAVENOUS | Status: DC | PRN
  Administered 2023-01-02: 10 mL via INTRAVENOUS

## 2023-01-02 NOTE — Patient Instructions (Signed)
[facility]  Discharge Instructions  You were seen and examined today by Dr. Ellin Saba.  Dr. Ellin Saba has reviewed your most recent scan, procedure with Dr. Annabell Howells and Pluvicto administration.  Increase Vicodin to 10mg  when you take it.  Continue with your Pluvicto schedule. We will check labs today.  Follow-up as scheduled.   Thank you for choosing  [facility] to provide your oncology and hematology care.   To afford each patient quality time with our provider, please arrive at least 15 minutes before your scheduled appointment time. You may need to reschedule your appointment if you arrive late (10 or more minutes). Arriving late affects you and other patients whose appointments are after yours.  Also, if you miss three or more appointments without notifying the office, you may be dismissed from the clinic at the provider's discretion.    Again, thank you for choosing [facility].  Our hope is that these requests will decrease the amount of time that you wait before being seen by our physicians.   If you have a lab appointment with the Cancer Center - please note that after April 8th, all labs will be drawn in the cancer center.  You do not have to check in or register with the main entrance as you have in the past but will complete your check-in at the cancer center.            _____________________________________________________________  Should you have questions after your visit to [facility], please contact our office at [phone] and follow the prompts.  Our office hours are 8:00 a.m. to 4:30 p.m. Monday - Thursday and 8:00 a.m. to 2:30 p.m. Friday.  Please note that voicemails left after 4:00 p.m. may not be returned until the following business day.  We are closed weekends and all major holidays.  You do have access to a nurse 24-7, just call the main number to the clinic [phone] and do not  press any options, hold on the line and a nurse will answer the phone.    For prescription refill requests, have your pharmacy contact our office and allow 72 hours.    Masks are no longer required in the cancer centers. If you would like for your care team to wear a mask while they are taking care of you, please let them know. You may have one support person who is at least 65 years old accompany you for your appointments.

## 2023-01-02 NOTE — Progress Notes (Signed)
Greer Ee presented for Portacath access and flush. Proper placement of portacath confirmed by CXR. Portacath located right chest wall accessed with  H 20 needle. Good blood return present. Portacath flushed with 20ml NS and 500U/62ml Heparin and needle removed intact. Procedure without incident. Patient tolerated procedure well.

## 2023-01-03 ENCOUNTER — Telehealth: Payer: Self-pay | Admitting: *Deleted

## 2023-01-03 ENCOUNTER — Other Ambulatory Visit: Payer: Self-pay | Admitting: *Deleted

## 2023-01-03 MED ORDER — MEGESTROL ACETATE 40 MG/ML PO SUSP: 400.0000 mg | Freq: Two times a day (BID) | ORAL | 2 refills | Status: DC

## 2023-01-03 MED ORDER — DRONABINOL 5 MG PO CAPS: 5.0000 mg | ORAL_CAPSULE | Freq: Two times a day (BID) | ORAL | 1 refills | Status: DC

## 2023-01-03 NOTE — Telephone Encounter (Signed)
Per Dr. Ellin Saba, sodium was low on lab results.  Patient notified to increase sodium in his diet.  Verbalized understanding.

## 2023-01-07 NOTE — Progress Notes (Unsigned)
Name: Scott [name] DOB: 10-06-1955 MRN: [id number]  Diagnoses: Post-operative state  HPI: Scott [name] presents post-operatively. He is accompanied by his wife. GU History: 1. 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   Today He presents s/p the following procedures by Dr. Annabell Howells on 12/18/2022:  Procedure:  1.  Cystoscopy with removal of bladder stones. 2.  Transurethral resection of the prostate for recurrent prostate cancer.   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   Postop diagnosis: Same with bladder stones.   Specimen: Prostate chips.   Pathology:  A. PROSTATE CHIPS, TURP:  Prostatic adenocarcinoma without showing significant treatment effect, Gleason score 5+4=9 (grade group 5) involves 90% of the resected tissue.  Postop course: > 12/28/2022: Received Pluvicto cycle 3.  > 12/31/2022: Started Bactrim for UTI as prescribed by Dr. Annabell Howells.   > 01/02/2023: Follow up visit with Dr. Ellin Saba (Oncology). His opioid analgesic was increased for pain management.  >Today: He reports that his UTI symptoms have been improved some since taking the Bactrim however he continues to have dysuria along with increased urinary urgency, frequency, and significant fatigue. He denies fevers, gross hematuria, hesitancy, straining to void, sensations of incomplete emptying, flank pain, or abdominal pain. Denies constipation at this time.    Fall Screening: Do you usually have a device to assist in your mobility? No   Medications: Current Outpatient Medications  Medication Sig Dispense Refill   cephALEXin (KEFLEX) 500 MG capsule Take 1 capsule (500 mg total) by mouth 3 (three) times daily for 7 days. 21 capsule 0   Cholecalciferol 125 MCG (5000 UT) capsule Take 10,000 Units by mouth daily.     dexamethasone (DECADRON) 4 MG tablet Take 1 tablet (4 mg total) by mouth daily. Take for three days after each Pluvicto treatment 30 tablet 0    docusate sodium (COLACE) 100 MG capsule Take 100 mg by mouth daily.     dronabinol (MARINOL) 5 MG capsule Take 1 capsule (5 mg total) by mouth 2 (two) times daily before lunch and supper. 60 capsule 1   dutasteride (AVODART) 0.5 MG capsule TAKE 1 CAPSULE(0.5 MG) BY MOUTH DAILY 90 capsule 3   Elastic Bandages & Supports (B-4 MED COMPRESSION HOSE MENS) MISC Wear as needed when awake to help prevent leg swelling 2 each 0   estradiol (VIVELLE-DOT) 0.1 MG/24HR patch APPLY 1 PATCH(0.1 MG) TOPICALLY TO THE SKIN 2 TIMES A WEEK 8 patch 12   HYDROcodone-acetaminophen (NORCO) 10-325 MG tablet Take 1 tablet by mouth every 6 (six) hours as needed. 120 tablet 0   MAGNESIUM PO Take 1 tablet by mouth at bedtime.     megestrol (MEGACE) 40 MG/ML suspension Take 10 mLs (400 mg total) by mouth 2 (two) times daily. 480 mL 2   MELATONIN PO Take 40 mg by mouth at bedtime.     metoprolol tartrate (LOPRESSOR) 25 MG tablet Take 0.5 tablets (12.5 mg total) by mouth 2 (two) times daily. 30 tablet 1   Multiple Vitamin (MULTIVITAMIN WITH MINERALS) TABS tablet Take 1 tablet by mouth daily.     NATTOKINASE PO Take 2 capsules by mouth daily.     NP THYROID 120 MG tablet Take 0.5 tablets (60 mg total) by mouth every morning. 45 tablet 1   olmesartan (BENICAR) 20 MG tablet TAKE 1 TABLET(20 MG) BY MOUTH DAILY 90 tablet 1   ondansetron (ZOFRAN ODT) 8 MG disintegrating tablet Take 1 tablet (8 mg total) by mouth every 8 (eight) hours as needed for  nausea or vomiting. 60 tablet 3   phenazopyridine (PYRIDIUM) 200 MG tablet Take 1 tablet (200 mg total) by mouth 3 (three) times daily. 45 tablet 0   prochlorperazine (COMPAZINE) 10 MG tablet Take 10 mg by mouth daily.     prochlorperazine (COMPAZINE) 25 MG suppository Place 1 suppository (25 mg total) rectally every 8 (eight) hours as needed for nausea or vomiting. 24 suppository 3   pyridOXINE (VITAMIN B6) 25 MG tablet Take 25 mg by mouth daily.     sulfamethoxazole-trimethoprim (BACTRIM  DS) 800-160 MG tablet Take 1 tablet by mouth 2 (two) times daily.     thiamine (VITAMIN B-1) 50 MG tablet Take 1 tablet (50 mg total) by mouth daily. 90 tablet 1   traMADol (ULTRAM) 50 MG tablet Take 2 tablets (100 mg total) by mouth every 6 (six) hours as needed. 120 tablet 0   vitamin E 180 MG (400 UNITS) capsule Take 400 Units by mouth daily.     VITAMIN K PO Take 1 capsule by mouth daily.     Current Facility-Administered Medications  Medication Dose Route Frequency Provider Last Rate Last Admin   leuprolide (6 Month) (ELIGARD) injection 45 mg  45 mg Subcutaneous Once         Allergies: Allergies  Allergen Reactions   Crestor [Rosuvastatin] Other (See Comments)    myalgia   Ivp Dye [Iodinated Contrast Media] Swelling    Swelling on head, took Benadryl and swelling reduced   Wellbutrin [Bupropion] Hives    Past Medical History:  Diagnosis Date   Anemia    Bladder obstruction    Complication of anesthesia    Diverticulitis 2015   Dyspnea    Essential hypertension, benign 01/26/2019   at doctor's office elevated, normal at home, no medications at this time   GERD (gastroesophageal reflux disease)    History of basal cell cancer    HLD (hyperlipidemia) 01/26/2019   Hypothyroidism    history of, not currently taking medication   PONV (postoperative nausea and vomiting)    Prostate cancer (HCC)    metastatic to bones   Vitamin D deficiency disease 01/26/2019   Past Surgical History:  Procedure Laterality Date   CYSTOSCOPY W/ URETERAL STENT PLACEMENT Left 11/10/2019   Procedure: CYSTOSCOPY;  Surgeon: Bjorn Pippin, MD;  Location: [facility];  Service: Urology;  Laterality: Left;   ESOPHAGOGASTRODUODENOSCOPY (EGD) WITH PROPOFOL N/A 07/07/2020   Procedure: ESOPHAGOGASTRODUODENOSCOPY (EGD) WITH PROPOFOL;  Surgeon: Corbin Ade, MD;  Location: [facility];  Service: Endoscopy;  Laterality: N/A;  pm appt   HERNIA REPAIR  1995   UMBILICAL    IR FLUORO GUIDED NEEDLE PLC  ASPIRATION/INJECTION LOC  04/09/2022   IR IMAGING GUIDED PORT INSERTION  10/11/2022   MALONEY DILATION N/A 07/07/2020   Procedure: Elease Hashimoto DILATION;  Surgeon: Corbin Ade, MD;  Location: [facility];  Service: Endoscopy;  Laterality: N/A;   PROSTATE SURGERY     SKIN CANCER EXCISION     TONSILLECTOMY Bilateral    TOOTH EXTRACTION     front left tooth pulled but no surgery or stitches   TRANSURETHRAL RESECTION OF BLADDER TUMOR N/A 12/18/2022   Procedure: REMOVAL OF BLADDER STONES;  Surgeon: Bjorn Pippin, MD;  Location: [facility];  Service: Urology;  Laterality: N/A;  1 HR FOR CASE   TRANSURETHRAL RESECTION OF PROSTATE N/A 01/25/2017   Procedure: TRANSURETHRAL RESECTION OF THE PROSTATE (TURP);  Surgeon: Sebastian Ache, MD;  Location: [facility];  Service: Urology;  Laterality:  N/A;   TRANSURETHRAL RESECTION OF PROSTATE N/A 11/10/2019   Procedure: TRANSURETHRAL RESECTION OF THE PROSTATE (TURP);  Surgeon: Bjorn Pippin, MD;  Location: [facility];  Service: Urology;  Laterality: N/A;   TRANSURETHRAL RESECTION OF PROSTATE N/A 12/18/2022   Procedure: TRANSURETHRAL RESECTION OF THE PROSTATE (TURP);  Surgeon: Bjorn Pippin, MD;  Location: [facility];  Service: Urology;  Laterality: N/A;   WISDOM TOOTH EXTRACTION     Family History  Problem Relation Age of Onset   Emphysema Mother    Alzheimer's disease Father    Prostate cancer Father        dx in his 62s   Prostate cancer Brother        dx in his 10s   Breast cancer Maternal Grandmother        18s   Pancreatic cancer Maternal Grandfather        39s   Dementia Paternal Grandmother    Heart attack Paternal Grandfather    Colon cancer Neg Hx    Social History   Socioeconomic History   Marital status: Married    Spouse name: Not on file   Number of children: 1   Years of education: Not on file   Highest education level: Bachelor's degree (e.g., BA, AB, BS)  Occupational History    Comment: working full time  Tobacco Use   Smoking status: Former     Current packs/day: 0.00    Average packs/day: 1.5 packs/day for 35.0 years (52.5 ttl pk-yrs)    Types: Cigarettes    Start date: 01/11/1969    Quit date: 01/12/2004    Years since quitting: 19.0   Smokeless tobacco: Never   Tobacco comments:    OVER 30 YEARS HX OF SMOKING   Vaping Use   Vaping status: Never Used  Substance and Sexual Activity   Alcohol use: Not Currently    Comment: heavy drinker until 2004   Drug use: Not Currently    Types: Marijuana   Sexual activity: Not Currently  Other Topics Concern   Not on file  Social History Narrative   Married for 19 years.Works for [employer] from home.   Social Determinants of Health   Financial Resource Strain: Low Risk  (08/24/2022)   Overall Financial Resource Strain (CARDIA)    Difficulty of Paying Living Expenses: Not very hard  Food Insecurity: No Food Insecurity (12/18/2022)   Hunger Vital Sign    Worried About Running Out of Food in the Last Year: Never true    Ran Out of Food in the Last Year: Never true  Transportation Needs: No Transportation Needs (12/18/2022)   PRAPARE - Administrator, Civil Service (Medical): No    Lack of Transportation (Non-Medical): No  Physical Activity: Insufficiently Active (08/24/2022)   Exercise Vital Sign    Days of Exercise per Week: 5 days    Minutes of Exercise per Session: 20 min  Stress: Stress Concern Present (08/24/2022)   [employer] of Occupational Health - Occupational Stress Questionnaire    Feeling of Stress : To some extent  Social Connections: Socially Isolated (08/24/2022)   Social Connection and Isolation Panel [NHANES]    Frequency of Communication with Friends and Family: Once a week    Frequency of Social Gatherings with Friends and Family: Once a week    Attends Religious Services: Never    Database administrator or Organizations: No    Attends Banker Meetings: Not on file  Marital Status: Married  Catering manager Violence: Not  At Risk (12/18/2022)   Humiliation, Afraid, Rape, and Kick questionnaire    Fear of Current or Ex-Partner: No    Emotionally Abused: No    Physically Abused: No    Sexually Abused: No    SUBJECTIVE  Review of Systems Constitutional: Patient denies any unintentional weight loss or change in strength lntegumentary: Patient denies any rashes or pruritus Cardiovascular: Patient denies chest pain or syncope Respiratory: Patient denies shortness of breath Gastrointestinal: Patient denies nausea, vomiting, constipation, or diarrhea Musculoskeletal: Patient denies muscle cramps or weakness Neurologic: Patient denies convulsions or seizures Allergic/Immunologic: Patient denies recent allergic reaction(s) Hematologic/Lymphatic: Patient denies bleeding tendencies Endocrine: Patient denies heat/cold intolerance  GU: As per HPI.  OBJECTIVE Vitals:   01/14/23 1321  BP: 118/69  Pulse: 76  Temp: 99 F (37.2 C)   There is no height or weight on file to calculate BMI.  Physical Examination Constitutional: No obvious distress; patient is non-toxic appearing  Cardiovascular: No visible lower extremity edema.  Respiratory: The patient does not have audible wheezing/stridor; respirations do not appear labored  Gastrointestinal: Abdomen non-distended Musculoskeletal: Normal ROM of UEs  Skin: No obvious rashes/open sores  Neurologic: CN 2-12 grossly intact Psychiatric: Answered questions appropriately with normal affect  Hematologic/Lymphatic/Immunologic: No obvious bruises or sites of spontaneous bleeding  UA: 6-10 WBC/hpf, >30 RBC/hpf, bacteria (few) PVR: 50 ml  ASSESSMENT Prostate cancer metastatic to multiple sites (HCC) - Plan: Urinalysis, Routine w reflex microscopic, BLADDER SCAN AMB NON-IMAGING, cephALEXin (KEFLEX) 500 MG capsule, Urine culture, leuprolide (6 Month) (ELIGARD) injection 45 mg  Hyperplasia of prostate with lower urinary tract symptoms (LUTS) - Plan: cephALEXin  (KEFLEX) 500 MG capsule, Urine culture  S/P TURP - Plan: cephALEXin (KEFLEX) 500 MG capsule, Urine culture  Postop check - Plan: cephALEXin (KEFLEX) 500 MG capsule, Urine culture  UTI symptoms - Plan: cephALEXin (KEFLEX) 500 MG capsule, Urine culture, phenazopyridine (PYRIDIUM) 200 MG tablet  History of UTI - Plan: cephALEXin (KEFLEX) 500 MG capsule, Urine culture  Dysuria - Plan: phenazopyridine (PYRIDIUM) 200 MG tablet  We reviewed the operative procedures and findings.  Due to Eligard today; administered by nursing staff. Abnormal UA. Will check urine culture and treat empirically with Keflex while awaiting culture results and sensitivities. Will plan for follow up in 3 months with Dr. Annabell Howells or sooner if needed. Pt verbalized understanding and agreement. All questions were answered.  PLAN Advised the following: Urine culture. Keflex 500 mg 3x/day x7 days. Eligard given today. Return in about 3 months (around 04/16/2023) for f/u with Dr. Annabell Howells.  Orders Placed This Encounter  Procedures   Urine culture   Urinalysis, Routine w reflex microscopic   BLADDER SCAN AMB NON-IMAGING    It has been explained that the patient is to follow regularly with their PCP in addition to all other providers involved in their care and to follow instructions provided by these respective offices. Patient advised to contact urology clinic if any urologic-pertaining questions, concerns, new symptoms or problems arise in the interim period.  There are no Patient Instructions on file for this visit.  Electronically signed by:  Donnita Falls, MSN, FNP-C, CUNP 01/14/2023 2:30 PM

## 2023-01-14 ENCOUNTER — Ambulatory Visit: Payer: BC Managed Care – PPO | Admitting: Urology

## 2023-01-14 ENCOUNTER — Encounter: Payer: Self-pay | Admitting: Urology

## 2023-01-14 VITALS — BP 118/69 | HR 76 | Temp 99.0°F

## 2023-01-14 DIAGNOSIS — Z87440 Personal history of urinary (tract) infections: Secondary | ICD-10-CM | POA: Diagnosis not present

## 2023-01-14 DIAGNOSIS — R399 Unspecified symptoms and signs involving the genitourinary system: Secondary | ICD-10-CM

## 2023-01-14 DIAGNOSIS — R350 Frequency of micturition: Secondary | ICD-10-CM | POA: Diagnosis not present

## 2023-01-14 DIAGNOSIS — R5383 Other fatigue: Secondary | ICD-10-CM | POA: Diagnosis not present

## 2023-01-14 DIAGNOSIS — C61 Malignant neoplasm of prostate: Secondary | ICD-10-CM

## 2023-01-14 DIAGNOSIS — N401 Enlarged prostate with lower urinary tract symptoms: Secondary | ICD-10-CM

## 2023-01-14 DIAGNOSIS — R300 Dysuria: Secondary | ICD-10-CM | POA: Diagnosis not present

## 2023-01-14 DIAGNOSIS — Z09 Encounter for follow-up examination after completed treatment for conditions other than malignant neoplasm: Secondary | ICD-10-CM

## 2023-01-14 DIAGNOSIS — Z9079 Acquired absence of other genital organ(s): Secondary | ICD-10-CM

## 2023-01-14 LAB — URINALYSIS, ROUTINE W REFLEX MICROSCOPIC
Bilirubin, UA: NEGATIVE
Glucose, UA: NEGATIVE
Ketones, UA: NEGATIVE
Nitrite, UA: NEGATIVE
Specific Gravity, UA: 1.03 (ref 1.005–1.030)
Urobilinogen, Ur: 0.2 mg/dL (ref 0.2–1.0)
pH, UA: 6 (ref 5.0–7.5)

## 2023-01-14 LAB — MICROSCOPIC EXAMINATION: RBC, Urine: >30 /[HPF] — AB (ref 0–2)

## 2023-01-14 MED ORDER — CEPHALEXIN 500 MG PO CAPS
500.0000 mg | ORAL_CAPSULE | Freq: Three times a day (TID) | ORAL | 0 refills | Status: AC
Start: 2023-01-14 — End: 2023-01-21

## 2023-01-14 MED ORDER — PHENAZOPYRIDINE HCL 200 MG PO TABS: 200.0000 mg | ORAL_TABLET | Freq: Three times a day (TID) | ORAL | 0 refills | Status: DC

## 2023-01-14 MED ORDER — LEUPROLIDE ACETATE (6 MONTH) 45 MG ~~LOC~~ KIT
45.0000 mg | PACK | Freq: Once | SUBCUTANEOUS | Status: AC
Start: 2023-01-14 — End: 2023-01-14
  Administered 2023-01-14: 45 mg via SUBCUTANEOUS

## 2023-01-14 NOTE — Progress Notes (Unsigned)
post void residual= 50  Eligard SubQ Injection   Due to Prostate Cancer patient is present today for a Eligard Injection.  Medication: Eligard 6 month Dose: 45 mg  Location: right   Patient tolerated well, no complications were noted  Performed by: Tylek Boney LPN

## 2023-01-16 LAB — URINE CULTURE: Organism ID, Bacteria: NO GROWTH

## 2023-01-22 ENCOUNTER — Inpatient Hospital Stay: Payer: BC Managed Care – PPO

## 2023-01-28 ENCOUNTER — Other Ambulatory Visit: Payer: Self-pay | Admitting: *Deleted

## 2023-01-28 MED ORDER — OXYCODONE HCL 10 MG PO TABS: 10.0000 mg | ORAL_TABLET | Freq: Four times a day (QID) | ORAL | 0 refills | Status: DC | PRN

## 2023-01-29 ENCOUNTER — Inpatient Hospital Stay: Payer: BC Managed Care – PPO | Attending: Hematology

## 2023-01-29 ENCOUNTER — Other Ambulatory Visit (HOSPITAL_COMMUNITY): Payer: BC Managed Care – PPO

## 2023-01-29 VITALS — BP 133/66 | HR 63 | Temp 96.6°F | Resp 18

## 2023-01-29 DIAGNOSIS — Z87891 Personal history of nicotine dependence: Secondary | ICD-10-CM | POA: Diagnosis not present

## 2023-01-29 DIAGNOSIS — M545 Low back pain, unspecified: Secondary | ICD-10-CM | POA: Diagnosis not present

## 2023-01-29 DIAGNOSIS — Z79899 Other long term (current) drug therapy: Secondary | ICD-10-CM | POA: Insufficient documentation

## 2023-01-29 DIAGNOSIS — Z95828 Presence of other vascular implants and grafts: Secondary | ICD-10-CM

## 2023-01-29 DIAGNOSIS — C61 Malignant neoplasm of prostate: Secondary | ICD-10-CM | POA: Diagnosis present

## 2023-01-29 DIAGNOSIS — R112 Nausea with vomiting, unspecified: Secondary | ICD-10-CM | POA: Diagnosis not present

## 2023-01-29 DIAGNOSIS — C7951 Secondary malignant neoplasm of bone: Secondary | ICD-10-CM | POA: Insufficient documentation

## 2023-01-29 DIAGNOSIS — M25552 Pain in left hip: Secondary | ICD-10-CM | POA: Insufficient documentation

## 2023-01-29 DIAGNOSIS — Z803 Family history of malignant neoplasm of breast: Secondary | ICD-10-CM | POA: Insufficient documentation

## 2023-01-29 DIAGNOSIS — Z8042 Family history of malignant neoplasm of prostate: Secondary | ICD-10-CM | POA: Insufficient documentation

## 2023-01-29 DIAGNOSIS — M25551 Pain in right hip: Secondary | ICD-10-CM | POA: Diagnosis not present

## 2023-01-29 LAB — CBC WITH DIFFERENTIAL/PLATELET
Abs Immature Granulocytes: 0.02 10*3/uL (ref 0.00–0.07)
Basophils Absolute: 0.1 10*3/uL (ref 0.0–0.1)
Basophils Relative: 1 %
Eosinophils Absolute: 0 10*3/uL (ref 0.0–0.5)
Eosinophils Relative: 1 %
HCT: 27.6 % — ABNORMAL LOW (ref 39.0–52.0)
Hemoglobin: 9.1 g/dL — ABNORMAL LOW (ref 13.0–17.0)
Immature Granulocytes: 1 %
Lymphocytes Relative: 22 %
Lymphs Abs: 0.9 10*3/uL (ref 0.7–4.0)
MCH: 31.9 pg (ref 26.0–34.0)
MCHC: 33 g/dL (ref 30.0–36.0)
MCV: 96.8 fL (ref 80.0–100.0)
Monocytes Absolute: 0.6 10*3/uL (ref 0.1–1.0)
Monocytes Relative: 13 %
Neutro Abs: 2.6 10*3/uL (ref 1.7–7.7)
Neutrophils Relative %: 62 %
Platelets: 275 10*3/uL (ref 150–400)
RBC: 2.85 MIL/uL — ABNORMAL LOW (ref 4.22–5.81)
RDW: 13.2 % (ref 11.5–15.5)
WBC: 4.2 10*3/uL (ref 4.0–10.5)
nRBC: 0 % (ref 0.0–0.2)

## 2023-01-29 LAB — COMPREHENSIVE METABOLIC PANEL
ALT: 14 U/L (ref 0–44)
AST: 18 U/L (ref 15–41)
Albumin: 3 g/dL — ABNORMAL LOW (ref 3.5–5.0)
Alkaline Phosphatase: 343 U/L — ABNORMAL HIGH (ref 38–126)
Anion gap: 8 (ref 5–15)
BUN: 8 mg/dL (ref 8–23)
CO2: 19 mmol/L — ABNORMAL LOW (ref 22–32)
Calcium: 8.1 mg/dL — ABNORMAL LOW (ref 8.9–10.3)
Chloride: 103 mmol/L (ref 98–111)
Creatinine, Ser: 0.68 mg/dL (ref 0.61–1.24)
GFR, Estimated: >60 mL/min (ref >60)
Glucose, Bld: 99 mg/dL (ref 70–99)
Potassium: 3.7 mmol/L (ref 3.5–5.1)
Sodium: 130 mmol/L — ABNORMAL LOW (ref 135–145)
Total Bilirubin: 0.4 mg/dL (ref <1.2)
Total Protein: 6 g/dL — ABNORMAL LOW (ref 6.5–8.1)

## 2023-01-29 LAB — MAGNESIUM: Magnesium: 2 mg/dL (ref 1.7–2.4)

## 2023-01-29 LAB — PSA: Prostatic Specific Antigen: 345.71 ng/mL — ABNORMAL HIGH (ref 0.00–4.00)

## 2023-01-29 MED ORDER — HEPARIN SOD (PORK) LOCK FLUSH 100 UNIT/ML IV SOLN
500.0000 [IU] | Freq: Once | INTRAVENOUS | Status: AC
Start: 2023-01-29 — End: 2023-01-29
  Administered 2023-01-29: 500 [IU] via INTRAVENOUS

## 2023-01-29 MED ORDER — SODIUM CHLORIDE 0.9% FLUSH
10.0000 mL | INTRAVENOUS | Status: DC | PRN
  Administered 2023-01-29: 10 mL via INTRAVENOUS

## 2023-01-29 NOTE — Progress Notes (Signed)
Patients port flushed without difficulty.  Good blood return noted with no bruising or swelling noted at site.  Band aid applied.  VSS with discharge and left in satisfactory condition with no s/s of distress noted.   

## 2023-01-29 NOTE — Patient Instructions (Signed)

## 2023-01-30 ENCOUNTER — Encounter: Payer: Self-pay | Admitting: *Deleted

## 2023-01-30 ENCOUNTER — Ambulatory Visit: Payer: BC Managed Care – PPO | Admitting: Hematology

## 2023-01-30 ENCOUNTER — Inpatient Hospital Stay (HOSPITAL_BASED_OUTPATIENT_CLINIC_OR_DEPARTMENT_OTHER): Payer: BC Managed Care – PPO | Admitting: Hematology

## 2023-01-30 DIAGNOSIS — C7951 Secondary malignant neoplasm of bone: Secondary | ICD-10-CM

## 2023-01-30 DIAGNOSIS — C61 Malignant neoplasm of prostate: Secondary | ICD-10-CM | POA: Diagnosis not present

## 2023-01-30 MED ORDER — ONDANSETRON 8 MG PO TBDP: 8.0000 mg | ORAL_TABLET | Freq: Three times a day (TID) | ORAL | 3 refills | Status: DC | PRN

## 2023-01-30 MED ORDER — PROCHLORPERAZINE MALEATE 10 MG PO TABS: 10.0000 mg | ORAL_TABLET | Freq: Four times a day (QID) | ORAL | 3 refills | Status: DC | PRN

## 2023-01-30 MED ORDER — PROCHLORPERAZINE 25 MG RE SUPP: 25.0000 mg | Freq: Three times a day (TID) | RECTAL | 3 refills | Status: DC | PRN

## 2023-01-30 NOTE — Patient Instructions (Addendum)
[facility] at [facility] Discharge Instructions   You were seen and examined today by Dr. Ellin Saba.  He reviewed the results of your lab work which are normal/stable. Your PSA has come down to 345.   We sent refills for Zofran and Compazine to your pharmacy.   We will see you back in 6 weeks. We will repeat lab work prior to this visit.   Return as scheduled.    Thank you for choosing [facility] at [facility] to provide your oncology and hematology care.  To afford each patient quality time with our provider, please arrive at least 15 minutes before your scheduled appointment time.   If you have a lab appointment with the Cancer Center please come in thru the Main Entrance and check in at the main information desk.  You need to re-schedule your appointment should you arrive 10 or more minutes late.  We strive to give you quality time with our providers, and arriving late affects you and other patients whose appointments are after yours.  Also, if you no show three or more times for appointments you may be dismissed from the clinic at the providers discretion.     Again, thank you for choosing [facility].  Our hope is that these requests will decrease the amount of time that you wait before being seen by our physicians.       _____________________________________________________________  Should you have questions after your visit to [facility], please contact our office at [phone] and follow the prompts.  Our office hours are 8:00 a.m. and 4:30 p.m. Monday - Friday.  Please note that voicemails left after 4:00 p.m. may not be returned until the following business day.  We are closed weekends and major holidays.  You do have access to a nurse 24-7, just call the main number to the clinic [phone] and do not press any options, hold on the line and a nurse will answer the phone.    For prescription refill  requests, have your pharmacy contact our office and allow 72 hours.    Due to Covid, you will need to wear a mask upon entering the hospital. If you do not have a mask, a mask will be given to you at the Main Entrance upon arrival. For doctor visits, patients may have 1 support person age 24 or older with them. For treatment visits, patients can not have anyone with them due to social distancing guidelines and our immunocompromised population.

## 2023-01-30 NOTE — Progress Notes (Signed)
[facility] [address] [place] [address]    Clinic Day:  01/30/2023  Referring physician: Etta Grandchild, MD  Patient Care Team: Etta Grandchild, MD as PCP - General (Internal Medicine) Doreatha Massed, MD as Consulting Physician (Hematology and Oncology) Jena Gauss Gerrit Friends, MD as Consulting Physician (Gastroenterology) Therese Sarah, RN as Oncology Nurse Navigator (Medical Oncology)   ASSESSMENT & PLAN:   Assessment: 1.  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  Prominent left hydronephrosis and hydroureter noted.  New sclerotic lesion in the left anterior vertebral body of L1.  Mild left periaortic adenopathy increased from prior, short axis lymph node measuring 1 cm. -Last PSA increased to 15.8 on 09/28/2019. -As there is rapid progression of disease, I have recommended docetaxel chemotherapy.  He is agreeable after long discussion. -Cystoscopy with transurethral resection of tumor in the posterior bladder neck, right and left walls of the bladder, right lobe of the prostate on 11/10/2019. -Plan was to do 6 cycles of docetaxel followed by abiraterone and prednisone. -6 cycles of docetaxel from 11/16/2019 through 02/29/2020. -CTAP on 01/06/2020 showed persistent thickening of the urinary bladder wall.  Left hydronephrosis is significantly reduced compared to prior exam.  Left-sided retroperitoneal and left iliac lymph nodes reduced in size.  Interval increase in sclerosis of lesions of the left aspect of L2 vertebral body consistent with treatment response.  No new bone lesions. -Bone scan on 01/06/2020 shows stable L2 metastatic focus. -Bone scan scan images from 06/14/2020 which showed progressive metastatic disease, with increased size and intensity of L2 1 new focus of activity at  L1. 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 -Abiraterone and prednisone from 06/24/2020 through 09/06/2021 with progression. - PSMA PET scan (09/28/2021): Residual/recurrent tumor involving prostate gland and seminal vesicles.  Tracer avid bone metastasis localized L1 and L2 vertebral bodies.  No new sites of bone metastasis disease.  No solid organ or nodal mets. - Darolutamide from 10/17/2021 through 03/15/2022 with progression - Guardant360: MSI high not detected.  Androgen resistance present. - XRT to L2-L4, 30 Gray in 10 fractions completed on 04/23/2022 with improvement in pain. - Cabazitaxel 4 cycles from 05/07/2022 through 07/11/2022 with progression. - Pluvicto cycle 1 on 09/26/2022, cycle 2 on 11/08/2022, cycle 3 on 12/28/2022 - TURP on 12/18/2022 by Dr. Annabell Howells   2.  Left hydroureteronephrosis: -CT scan showed prostate mass invading the posterior bladder and obstructing the ureter.  Creatinine increased to 1.1. -Renal ultrasound on 12/11/2019 showed interval resolution of previously seen left-sided hydronephrosis.  Previously seen soft tissue mass at the posterior bladder lumen no longer seen.    Plan: 1.  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 - Reviewed labs from 01/29/2023: Alk phos is improving at 343.  Albumin is 3.0 and rest of LFTs are normal.  Sodium improved to 130.  Hemoglobin is down to 9.1 from myelosuppression.  PSA has improved to 345 from 439. - Labs are okay to proceed with cycle 4 of Pluvicto next week. - RTC 6 weeks for follow-up.   2.  Hot flashes: - Continue estradiol twice daily for hot flashes.   3.  Nausea/vomiting: - He takes dexamethasone daily for 3 days after each Pluvicto. - He had occasional vomiting since Sunday night.  Continue Zofran and  Compazine   4.  Lower back pain/bilateral hip pains: - He was taking Norco 10/325 every 8 hours as needed.  Pain was controlled for 6 hours.  Tramadol did not help. - I have sent a  prescription for oxycodone 10 mg every 6 hours as needed.  5.  Appetite: - Continue Megace once daily.  He is eating better and has gained 3 pounds since October 16.  No orders of the defined types were placed in this encounter.     Scott [name],acting as a Neurosurgeon for Doreatha Massed, MD.,have documented all relevant documentation on the behalf of Doreatha Massed, MD,as directed by  Doreatha Massed, MD while in the presence of Doreatha Massed, MD.  I, Doreatha Massed MD, have reviewed the above documentation for accuracy and completeness, and I agree with the above.    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    Prior Therapy: 1. TURP on 11/10/2019. 2. Docetaxel x 6 cycles, 11/16/2019 - 02/29/2020. 3.  Abiraterone and prednisone, 06/24/20 - 09/06/21 4.  Darolutamide, 10/17/21 - 03/15/22  Current Therapy:  Cabazitaxel every 21 days    HISTORY OF PRESENT ILLNESS:   Oncology History  Prostate cancer metastatic to multiple sites [facility])  01/25/2017 Initial Diagnosis   Prostate cancer metastatic to multiple sites [facility])   11/16/2019 - 03/02/2020 Chemotherapy   Patient is on Treatment Plan : PROSTATE Docetaxel + Prednisone q21d      Genetic Testing   Negative genetic testing. No pathogenic variants identified on the [facility] CancerNext-Expanded panel. 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 The report date is 04/19/2020.  The CancerNext-Expanded  gene panel offered by [facility] and includes sequencing and rearrangement analysis for the following 77 genes: IP, ALK, APC*, ATM*, AXIN2, BAP1, BARD1, BLM, BMPR1A, BRCA1*, BRCA2*, BRIP1*, CDC73, CDH1*,CDK4, CDKN1B, CDKN2A, CHEK2*, CTNNA1, DICER1, FANCC, FH, FLCN, GALNT12, KIF1B, LZTR1, MAX, MEN1, MET, MLH1*, MSH2*, MSH3, MSH6*, MUTYH*, NBN, NF1*, NF2,  NTHL1, PALB2*, PHOX2B, PMS2*, POT1, PRKAR1A, PTCH1, PTEN*, RAD51C*, RAD51D*,RB1, RECQL, RET, SDHA, SDHAF2, SDHB, SDHC, SDHD, SMAD4, SMARCA4, SMARCB1, SMARCE1, STK11, SUFU, TMEM127, TP53*,TSC1, TSC2, VHL and XRCC2 (sequencing and deletion/duplication); EGFR, EGLN1, HOXB13, KIT, MITF, PDGFRA, POLD1 and POLE (sequencing only); EPCAM and GREM1 (deletion/duplication only).    05/07/2022 -  Chemotherapy   Patient is on Treatment Plan : PROSTATE Cabazitaxel (20) D1 + Prednisone D1-21 q21d        INTERVAL HISTORY:   Scott [name] is a 67 y.o. 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 He was last seen by me on 01/02/23.  Today, he states that he is doing well overall. His appetite level is at 75%. His energy level is at 30%.  PAST MEDICAL HISTORY:   Past Medical History: Past Medical History:  Diagnosis Date   Anemia    Bladder obstruction    Complication of anesthesia    Diverticulitis 2015   Dyspnea    Essential hypertension, benign 01/26/2019   at doctor's office elevated, normal at home, no medications at this time   GERD (gastroesophageal reflux disease)    History of basal cell cancer    HLD (hyperlipidemia) 01/26/2019   Hypothyroidism    history of, not currently taking medication   PONV (postoperative nausea and vomiting)    Prostate cancer (HCC)    metastatic  to bones   Vitamin D deficiency disease 01/26/2019    Surgical History: Past Surgical History:  Procedure Laterality Date   CYSTOSCOPY W/ URETERAL STENT PLACEMENT Left 11/10/2019   Procedure: CYSTOSCOPY;  Surgeon: Bjorn Pippin, MD;  Location: [facility];  Service: Urology;  Laterality: Left;   ESOPHAGOGASTRODUODENOSCOPY (EGD) WITH PROPOFOL N/A 07/07/2020   Procedure: ESOPHAGOGASTRODUODENOSCOPY (EGD) WITH PROPOFOL;  Surgeon: Corbin Ade, MD;  Location: [facility];  Service: Endoscopy;  Laterality: N/A;  pm appt   HERNIA REPAIR  1995   UMBILICAL    IR FLUORO  GUIDED NEEDLE PLC ASPIRATION/INJECTION LOC  04/09/2022   IR IMAGING GUIDED PORT INSERTION  10/11/2022   MALONEY DILATION N/A 07/07/2020   Procedure: Elease Hashimoto DILATION;  Surgeon: Corbin Ade, MD;  Location: [facility];  Service: Endoscopy;  Laterality: N/A;   PROSTATE SURGERY     SKIN CANCER EXCISION     TONSILLECTOMY Bilateral    TOOTH EXTRACTION     front left tooth pulled but no surgery or stitches   TRANSURETHRAL RESECTION OF BLADDER TUMOR N/A 12/18/2022   Procedure: REMOVAL OF BLADDER STONES;  Surgeon: Bjorn Pippin, MD;  Location: [facility];  Service: Urology;  Laterality: N/A;  1 HR FOR CASE   TRANSURETHRAL RESECTION OF PROSTATE N/A 01/25/2017   Procedure: TRANSURETHRAL RESECTION OF THE PROSTATE (TURP);  Surgeon: Sebastian Ache, MD;  Location: [facility];  Service: Urology;  Laterality: N/A;   TRANSURETHRAL RESECTION OF PROSTATE N/A 11/10/2019   Procedure: TRANSURETHRAL RESECTION OF THE PROSTATE (TURP);  Surgeon: Bjorn Pippin, MD;  Location: [facility];  Service: Urology;  Laterality: N/A;   TRANSURETHRAL RESECTION OF PROSTATE N/A 12/18/2022   Procedure: TRANSURETHRAL RESECTION OF THE PROSTATE (TURP);  Surgeon: Bjorn Pippin, MD;  Location: [facility];  Service: Urology;  Laterality: N/A;   WISDOM TOOTH EXTRACTION      Social History: Social History   Socioeconomic History   Marital status: Married    Spouse name: Not on file   Number of children: 1   Years of education: Not on file   Highest education level: Bachelor's degree (e.g., BA, AB, BS)  Occupational History    Comment: working full time  Tobacco Use   Smoking status: Former    Current packs/day: 0.00    Average packs/day: 1.5 packs/day for 35.0 years (52.5 ttl pk-yrs)    Types: Cigarettes    Start date: 01/11/1969    Quit date: 01/12/2004    Years since quitting: 19.0   Smokeless tobacco: Never   Tobacco comments:    OVER 30 YEARS HX OF SMOKING   Vaping Use   Vaping status: Never Used  Substance and Sexual Activity   Alcohol  use: Not Currently    Comment: heavy drinker until 2004   Drug use: Not Currently    Types: Marijuana   Sexual activity: Not Currently  Other Topics Concern   Not on file  Social History Narrative   Married for 19 years.Works for [employer] from home.   Social Determinants of Health   Financial Resource Strain: Low Risk  (08/24/2022)   Overall Financial Resource Strain (CARDIA)    Difficulty of Paying Living Expenses: Not very hard  Food Insecurity: No Food Insecurity (12/18/2022)   Hunger Vital Sign    Worried About Running Out of Food in the Last Year: Never true    Ran Out of Food in the Last Year: Never true  Transportation Needs: No Transportation Needs (12/18/2022)   PRAPARE -  Administrator, Civil Service (Medical): No    Lack of Transportation (Non-Medical): No  Physical Activity: Insufficiently Active (08/24/2022)   Exercise Vital Sign    Days of Exercise per Week: 5 days    Minutes of Exercise per Session: 20 min  Stress: Stress Concern Present (08/24/2022)   [employer] of Occupational Health - Occupational Stress Questionnaire    Feeling of Stress : To some extent  Social Connections: Socially Isolated (08/24/2022)   Social Connection and Isolation Panel [NHANES]    Frequency of Communication with Friends and Family: Once a week    Frequency of Social Gatherings with Friends and Family: Once a week    Attends Religious Services: Never    Database administrator or Organizations: No    Attends Engineer, structural: Not on file    Marital Status: Married  Catering manager Violence: Not At Risk (12/18/2022)   Humiliation, Afraid, Rape, and Kick questionnaire    Fear of Current or Ex-Partner: No    Emotionally Abused: No    Physically Abused: No    Sexually Abused: No    Family History: Family History  Problem Relation Age of Onset   Emphysema Mother    Alzheimer's disease Father    Prostate cancer Father        dx in his 61s    Prostate cancer Brother        dx in his 50s   Breast cancer Maternal Grandmother        60s   Pancreatic cancer Maternal Grandfather        13s   Dementia Paternal Grandmother    Heart attack Paternal Grandfather    Colon cancer Neg Hx     Current Medications:  Current Outpatient Medications:    Cholecalciferol 125 MCG (5000 UT) capsule, Take 10,000 Units by mouth daily., Disp: , Rfl:    dexamethasone (DECADRON) 4 MG tablet, Take 1 tablet (4 mg total) by mouth daily. Take for three days after each Pluvicto treatment, Disp: 30 tablet, Rfl: 0   docusate sodium (COLACE) 100 MG capsule, Take 100 mg by mouth daily., Disp: , Rfl:    dronabinol (MARINOL) 5 MG capsule, Take 1 capsule (5 mg total) by mouth 2 (two) times daily before lunch and supper., Disp: 60 capsule, Rfl: 1   dutasteride (AVODART) 0.5 MG capsule, TAKE 1 CAPSULE(0.5 MG) BY MOUTH DAILY, Disp: 90 capsule, Rfl: 3   Elastic Bandages & Supports (B-4 MED COMPRESSION HOSE MENS) MISC, Wear as needed when awake to help prevent leg swelling, Disp: 2 each, Rfl: 0   estradiol (VIVELLE-DOT) 0.1 MG/24HR patch, APPLY 1 PATCH(0.1 MG) TOPICALLY TO THE SKIN 2 TIMES A WEEK, Disp: 8 patch, Rfl: 12   MAGNESIUM PO, Take 1 tablet by mouth at bedtime., Disp: , Rfl:    megestrol (MEGACE) 40 MG/ML suspension, Take 10 mLs (400 mg total) by mouth 2 (two) times daily., Disp: 480 mL, Rfl: 2   MELATONIN PO, Take 40 mg by mouth at bedtime., Disp: , Rfl:    metoprolol tartrate (LOPRESSOR) 25 MG tablet, Take 0.5 tablets (12.5 mg total) by mouth 2 (two) times daily., Disp: 30 tablet, Rfl: 1   Multiple Vitamin (MULTIVITAMIN WITH MINERALS) TABS tablet, Take 1 tablet by mouth daily., Disp: , Rfl:    NATTOKINASE PO, Take 2 capsules by mouth daily., Disp: , Rfl:    NP THYROID 120 MG tablet, Take 0.5 tablets (60 mg total) by mouth every morning.,  Disp: 45 tablet, Rfl: 1   olmesartan (BENICAR) 20 MG tablet, TAKE 1 TABLET(20 MG) BY MOUTH DAILY, Disp: 90 tablet, Rfl: 1    Oxycodone HCl 10 MG TABS, Take 1 tablet (10 mg total) by mouth every 6 (six) hours as needed., Disp: 120 tablet, Rfl: 0   phenazopyridine (PYRIDIUM) 200 MG tablet, Take 1 tablet (200 mg total) by mouth 3 (three) times daily., Disp: 45 tablet, Rfl: 0   prochlorperazine (COMPAZINE) 10 MG tablet, Take 1 tablet (10 mg total) by mouth every 6 (six) hours as needed for nausea or vomiting., Disp: 60 tablet, Rfl: 3   pyridOXINE (VITAMIN B6) 25 MG tablet, Take 25 mg by mouth daily., Disp: , Rfl:    thiamine (VITAMIN B-1) 50 MG tablet, Take 1 tablet (50 mg total) by mouth daily., Disp: 90 tablet, Rfl: 1   traMADol (ULTRAM) 50 MG tablet, Take 2 tablets (100 mg total) by mouth every 6 (six) hours as needed., Disp: 120 tablet, Rfl: 0   vitamin E 180 MG (400 UNITS) capsule, Take 400 Units by mouth daily., Disp: , Rfl:    VITAMIN K PO, Take 1 capsule by mouth daily., Disp: , Rfl:    ondansetron (ZOFRAN ODT) 8 MG disintegrating tablet, Take 1 tablet (8 mg total) by mouth every 8 (eight) hours as needed for nausea or vomiting., Disp: 60 tablet, Rfl: 3   Allergies: Allergies  Allergen Reactions   Crestor [Rosuvastatin] Other (See Comments)    myalgia   Ivp Dye [Iodinated Contrast Media] Swelling    Swelling on head, took Benadryl and swelling reduced   Wellbutrin [Bupropion] Hives    REVIEW OF SYSTEMS:   Review of Systems  Constitutional:  Negative for chills, fatigue and fever.  HENT:   Negative for lump/mass, mouth sores, nosebleeds, sore throat and trouble swallowing.   Eyes:  Negative for eye problems.  Respiratory:  Positive for shortness of breath. Negative for cough.   Cardiovascular:  Negative for chest pain, leg swelling and palpitations.  Gastrointestinal:  Positive for constipation, nausea and vomiting. Negative for abdominal pain and diarrhea.  Genitourinary:  Positive for dysuria. Negative for bladder incontinence, difficulty urinating, frequency, hematuria and nocturia.   Musculoskeletal:   Negative for arthralgias, back pain, flank pain, myalgias and neck pain.  Skin:  Negative for itching and rash.  Neurological:  Positive for headaches. Negative for dizziness and numbness.  Hematological:  Does not bruise/bleed easily.  Psychiatric/Behavioral:  Negative for depression, sleep disturbance and suicidal ideas. The patient is not nervous/anxious.   All other systems reviewed and are negative.    VITALS:   Blood pressure 136/73, pulse 69, temperature 97.7 F (36.5 C), temperature source Tympanic, resp. rate 17, weight 153 lb 1.6 oz (69.4 kg), SpO2 100%.  Wt Readings from Last 3 Encounters:  01/30/23 153 lb 1.6 oz (69.4 kg)  01/02/23 150 lb 14.4 oz (68.4 kg)  12/18/22 147 lb (66.7 kg)    Body mass index is 24.71 kg/m.  Performance status (ECOG): 1 - Symptomatic but completely ambulatory  PHYSICAL EXAM:   Physical Exam Vitals and nursing note reviewed. Exam conducted with a chaperone present.  Constitutional:      Appearance: Normal appearance.  Cardiovascular:     Rate and Rhythm: Normal rate and regular rhythm.     Pulses: Normal pulses.     Heart sounds: Normal heart sounds.  Pulmonary:     Effort: Pulmonary effort is normal.     Breath sounds: Normal breath sounds.  Abdominal:     Palpations: Abdomen is soft. There is no hepatomegaly, splenomegaly or mass.     Tenderness: There is no abdominal tenderness.  Musculoskeletal:     Right lower leg: Edema present.     Left lower leg: Edema present.  Lymphadenopathy:     Cervical: No cervical adenopathy.     Right cervical: No superficial, deep or posterior cervical adenopathy.    Left cervical: No superficial, deep or posterior cervical adenopathy.     Upper Body:     Right upper body: No supraclavicular or axillary adenopathy.     Left upper body: No supraclavicular or axillary adenopathy.  Neurological:     General: No focal deficit present.     Mental Status: He is alert and oriented to person, place, and  time.  Psychiatric:        Mood and Affect: Mood normal.        Behavior: Behavior normal.     LABS:      Latest Ref Rng & Units 01/29/2023   11:17 AM 01/02/2023   10:47 AM 12/11/2022    2:50 PM  CBC  WBC 4.0 - 10.5 K/uL 4.2  8.6  6.8   Hemoglobin 13.0 - 17.0 g/dL 9.1  86.5  78.4   Hematocrit 39.0 - 52.0 % 27.6  30.4  31.4   Platelets 150 - 400 K/uL 275  277  258       Latest Ref Rng & Units 01/29/2023   11:17 AM 01/02/2023   10:47 AM 12/11/2022    2:50 PM  CMP  Glucose 70 - 99 mg/dL 99  93  91   BUN 8 - 23 mg/dL 8  14  16    Creatinine 0.61 - 1.24 mg/dL 6.96  2.95  2.84   Sodium 135 - 145 mmol/L 130  126  132   Potassium 3.5 - 5.1 mmol/L 3.7  3.4  4.3   Chloride 98 - 111 mmol/L 103  98  104   CO2 22 - 32 mmol/L 19  18  21    Calcium 8.9 - 10.3 mg/dL 8.1  7.8  8.5   Total Protein 6.5 - 8.1 g/dL 6.0  5.8    Total Bilirubin <1.2 mg/dL 0.4  0.7    Alkaline Phos 38 - 126 U/L 343  498    AST 15 - 41 U/L 18  39    ALT 0 - 44 U/L 14  63       No results found for: "CEA1", "CEA" / No results found for: "CEA1", "CEA" Lab Results  Component Value Date   PSA1 12.9 (H) 05/26/2020   No results found for: "XLK440" No results found for: "CAN125"  No results found for: "TOTALPROTELP", "ALBUMINELP", "A1GS", "A2GS", "BETS", "BETA2SER", "GAMS", "MSPIKE", "SPEI" Lab Results  Component Value Date   TIBC 336.0 01/09/2021   TIBC 280 04/20/2020   FERRITIN 69.8 01/09/2021   FERRITIN 45 04/20/2020   IRONPCTSAT 45.2 01/09/2021   IRONPCTSAT 31 04/20/2020   No results found for: "LDH"   STUDIES:   No results found.

## 2023-02-06 NOTE — Written Directive (Cosign Needed)
  PLUVICTO  THERAPY   RADIOPHARMACEUTICAL: Lutetium 177 vipivotide tetraxetan (Pluvicto)     PRESCRIBED DOSE FOR ADMINISTRATION:  200 mCi   ROUTE OFADMINISTRATION:  IV   DIAGNOSIS:  prostate cancer, Prostate cancer metastatic to bone [facility]), Prostate cancer    REFERRING PHYSICIAN:Katragadda, Vern Claude, MD    TREATMENT #:4   ADDITIONAL PHYSICIAN COMMENTS/NOTES:   AUTHORIZED USER SIGNATURE & TIME STAMP: Patriciaann Clan, MD   02/07/23    8:23 AM

## 2023-02-07 ENCOUNTER — Ambulatory Visit (HOSPITAL_COMMUNITY)
Admission: RE | Admit: 2023-02-07 | Discharge: 2023-02-07 | Disposition: A | Discharge status: Home / Self Care | Payer: BC Managed Care – PPO | Source: Ambulatory Visit | Attending: Hematology | Admitting: Hematology

## 2023-02-07 DIAGNOSIS — C61 Malignant neoplasm of prostate: Secondary | ICD-10-CM | POA: Diagnosis present

## 2023-02-07 DIAGNOSIS — C7951 Secondary malignant neoplasm of bone: Secondary | ICD-10-CM | POA: Diagnosis present

## 2023-02-07 MED ORDER — SODIUM CHLORIDE 0.9 % IV BOLUS: 1000.0000 mL | Freq: Once | INTRAVENOUS | Status: DC

## 2023-02-07 MED ORDER — LUTETIUM LU 177 VIPIVOTIDE TET 1000 MBQ/ML IV SOLN
201.8000 | Freq: Once | INTRAVENOUS | Status: AC
  Administered 2023-02-07: 201.8 via INTRAVENOUS

## 2023-02-07 MED ORDER — PALONOSETRON HCL INJECTION 0.25 MG/5ML
0.2500 mg | Freq: Once | INTRAVENOUS | Status: AC
  Administered 2023-02-07: 0.25 mg via INTRAVENOUS
  Filled 2023-02-07: qty 5

## 2023-02-07 MED ORDER — SODIUM CHLORIDE 0.9 % IV SOLN
Freq: Once | INTRAVENOUS | Status: AC
  Filled 2023-02-07: qty 5

## 2023-02-07 NOTE — Progress Notes (Signed)
Tolerated Pluvicto treatment well

## 2023-02-07 NOTE — Progress Notes (Signed)
CLINICAL DATA: [Prostate cancer.  Cancer distant metastatic prostate carcinoma.  67 year old male]  EXAM: NUCLEAR MEDICINE PLUVICTO INJECTION  TECHNIQUE: Infusion: The nuclear medicine technologist and I personally verified the dose activity to be delivered as specified in the written directive, and verified the patient identification via 2 separate methods.  Initial flush of the intravenous catheter was performed was sterile saline. The dose syringe was connected to the catheter and the Lu-177 Pluvicto administered over a 1 to 10 min infusion. Single 10 cc  lushes with normal saline follow the dose. No complications were noted. The entire IV tubing, venocatheter, stopcock and syringes was removed in total, placed in a disposal bag and sent for assay of the residual activity, which will be reported at a later time in our EMR by the physics staff. Pressure was applied to the venipuncture site, and a compression bandage placed. Patient monitored for 1 hour following infusion.    Radiation Safety personnel were present to perform the discharge survey, as detailed on their documentation. After a short period of observation, the patient had his IV removed.  RADIOPHARMACEUTICALS: [201.8] microcuries Lu-177 PLUVICTO  FINDINGS: Current Infusion: [4]  Planned Infusions: 6    Patient presented to nuclear medicine for treatment.  Patient reports no significant nausea or vomiting following third treatment.  Some improved appetite and weight.  Persistent fatigue.       The patient's most recent blood counts were reviewed and remains a good candidate to proceed with Lu-177 Pluvicto.     PSA continues to decline equal 340 decreased from 439.0.  chronic low hemoglobin stable at 9.1.  Liver function and renal function normal.     Antinausea cocktail again utilized;   Emend  150 mg, Decadron 12 mg, Aloxi 0.25 mg       The patient was situated in an infusion suite with a contact barrier  placed under the arm. Intravenous access was established, using sterile technique, and a normal saline infusion from a syringe was started.     Micro-dosimetry: The prescribed radiation activity was assayed and confirmed to be within specified tolerance.  IMPRESSION: Current Infusion: [4]  Planned Infusions: 6    [The patient tolerated the infusion well. The patient will return in one month for ongoing care.]

## 2023-02-21 ENCOUNTER — Encounter: Payer: Self-pay | Admitting: Hematology

## 2023-02-27 ENCOUNTER — Other Ambulatory Visit: Payer: Self-pay | Admitting: *Deleted

## 2023-02-28 ENCOUNTER — Encounter: Payer: Self-pay | Admitting: Hematology

## 2023-02-28 MED ORDER — OXYCODONE HCL 10 MG PO TABS: 10.0000 mg | ORAL_TABLET | Freq: Four times a day (QID) | ORAL | 0 refills | Status: DC | PRN

## 2023-03-01 ENCOUNTER — Inpatient Hospital Stay: Payer: BC Managed Care – PPO

## 2023-03-14 ENCOUNTER — Inpatient Hospital Stay: Payer: BC Managed Care – PPO | Attending: Hematology

## 2023-03-14 ENCOUNTER — Other Ambulatory Visit: Payer: Self-pay

## 2023-03-14 DIAGNOSIS — Z8042 Family history of malignant neoplasm of prostate: Secondary | ICD-10-CM | POA: Diagnosis not present

## 2023-03-14 DIAGNOSIS — M25551 Pain in right hip: Secondary | ICD-10-CM | POA: Diagnosis not present

## 2023-03-14 DIAGNOSIS — R232 Flushing: Secondary | ICD-10-CM | POA: Diagnosis not present

## 2023-03-14 DIAGNOSIS — Z803 Family history of malignant neoplasm of breast: Secondary | ICD-10-CM | POA: Diagnosis not present

## 2023-03-14 DIAGNOSIS — N1330 Unspecified hydronephrosis: Secondary | ICD-10-CM | POA: Insufficient documentation

## 2023-03-14 DIAGNOSIS — Z800 Family history of malignant neoplasm of digestive organs: Secondary | ICD-10-CM | POA: Insufficient documentation

## 2023-03-14 DIAGNOSIS — R112 Nausea with vomiting, unspecified: Secondary | ICD-10-CM | POA: Diagnosis not present

## 2023-03-14 DIAGNOSIS — C61 Malignant neoplasm of prostate: Secondary | ICD-10-CM | POA: Insufficient documentation

## 2023-03-14 DIAGNOSIS — M545 Low back pain, unspecified: Secondary | ICD-10-CM | POA: Diagnosis not present

## 2023-03-14 DIAGNOSIS — M25552 Pain in left hip: Secondary | ICD-10-CM | POA: Insufficient documentation

## 2023-03-14 DIAGNOSIS — C7951 Secondary malignant neoplasm of bone: Secondary | ICD-10-CM | POA: Diagnosis not present

## 2023-03-14 DIAGNOSIS — Z87891 Personal history of nicotine dependence: Secondary | ICD-10-CM | POA: Diagnosis not present

## 2023-03-14 DIAGNOSIS — C779 Secondary and unspecified malignant neoplasm of lymph node, unspecified: Secondary | ICD-10-CM | POA: Insufficient documentation

## 2023-03-14 LAB — CBC WITH DIFFERENTIAL/PLATELET
Abs Immature Granulocytes: 0.03 10*3/uL (ref 0.00–0.07)
Basophils Absolute: 0 10*3/uL (ref 0.0–0.1)
Basophils Relative: 1 %
Eosinophils Absolute: 0 10*3/uL (ref 0.0–0.5)
Eosinophils Relative: 1 %
HCT: 28.5 % — ABNORMAL LOW (ref 39.0–52.0)
Hemoglobin: 9.6 g/dL — ABNORMAL LOW (ref 13.0–17.0)
Immature Granulocytes: 1 %
Lymphocytes Relative: 20 %
Lymphs Abs: 1 10*3/uL (ref 0.7–4.0)
MCH: 31 pg (ref 26.0–34.0)
MCHC: 33.7 g/dL (ref 30.0–36.0)
MCV: 91.9 fL (ref 80.0–100.0)
Monocytes Absolute: 0.5 10*3/uL (ref 0.1–1.0)
Monocytes Relative: 10 %
Neutro Abs: 3.3 10*3/uL (ref 1.7–7.7)
Neutrophils Relative %: 67 %
Platelets: 264 10*3/uL (ref 150–400)
RBC: 3.1 MIL/uL — ABNORMAL LOW (ref 4.22–5.81)
RDW: 13.5 % (ref 11.5–15.5)
WBC: 4.8 10*3/uL (ref 4.0–10.5)
nRBC: 0 % (ref 0.0–0.2)

## 2023-03-14 LAB — COMPREHENSIVE METABOLIC PANEL
ALT: 13 U/L (ref 0–44)
AST: 18 U/L (ref 15–41)
Albumin: 3.2 g/dL — ABNORMAL LOW (ref 3.5–5.0)
Alkaline Phosphatase: 304 U/L — ABNORMAL HIGH (ref 38–126)
Anion gap: 9 (ref 5–15)
BUN: 10 mg/dL (ref 8–23)
CO2: 19 mmol/L — ABNORMAL LOW (ref 22–32)
Calcium: 8.6 mg/dL — ABNORMAL LOW (ref 8.9–10.3)
Chloride: 104 mmol/L (ref 98–111)
Creatinine, Ser: 0.65 mg/dL (ref 0.61–1.24)
GFR, Estimated: >60 mL/min (ref >60)
Glucose, Bld: 95 mg/dL (ref 70–99)
Potassium: 3.6 mmol/L (ref 3.5–5.1)
Sodium: 132 mmol/L — ABNORMAL LOW (ref 135–145)
Total Bilirubin: 0.6 mg/dL (ref <1.2)
Total Protein: 5.9 g/dL — ABNORMAL LOW (ref 6.5–8.1)

## 2023-03-14 LAB — PSA: Prostatic Specific Antigen: 392.68 ng/mL — ABNORMAL HIGH (ref 0.00–4.00)

## 2023-03-14 LAB — MAGNESIUM: Magnesium: 2 mg/dL (ref 1.7–2.4)

## 2023-03-14 MED ORDER — HEPARIN SOD (PORK) LOCK FLUSH 100 UNIT/ML IV SOLN
500.0000 [IU] | Freq: Once | INTRAVENOUS | Status: AC
  Administered 2023-03-14: 500 [IU] via INTRAVENOUS

## 2023-03-14 MED ORDER — SODIUM CHLORIDE 0.9% FLUSH
10.0000 mL | Freq: Once | INTRAVENOUS | Status: AC
  Administered 2023-03-14: 10 mL via INTRAVENOUS

## 2023-03-14 NOTE — Progress Notes (Signed)
Patients port flushed without difficulty.  Good blood return noted with no bruising or swelling noted at site. Labs drawn per orders. Band aid applied.  VSS with discharge and left in satisfactory condition with no s/s of distress noted. All follow ups as scheduled.       Scott [name] [employer]

## 2023-03-15 ENCOUNTER — Encounter: Payer: Self-pay | Admitting: Hematology

## 2023-03-19 ENCOUNTER — Inpatient Hospital Stay (HOSPITAL_BASED_OUTPATIENT_CLINIC_OR_DEPARTMENT_OTHER): Payer: BC Managed Care – PPO | Admitting: Hematology

## 2023-03-19 ENCOUNTER — Encounter: Payer: Self-pay | Admitting: Hematology

## 2023-03-19 VITALS — BP 141/72 | HR 70 | Temp 98.6°F | Resp 17 | Wt 147.7 lb

## 2023-03-19 DIAGNOSIS — C61 Malignant neoplasm of prostate: Secondary | ICD-10-CM | POA: Diagnosis not present

## 2023-03-19 NOTE — Patient Instructions (Signed)
 [facility] at [facility] Discharge Instructions   You were seen and examined today by Dr. Rogers.  He reviewed the results of your lab work. Your PSA has gone up some, but your alkaline phosphatase has gone down, which indicates the cancer is stable/improving.   Proceed with Pluvicto  treatment on Thursday.   We will see you back in 6 weeks. We will repeat lab work one week prior.  Return as scheduled.    Thank you for choosing [facility] at [facility] to provide your oncology and hematology care.  To afford each patient quality time with our provider, please arrive at least 15 minutes before your scheduled appointment time.   If you have a lab appointment with the Cancer Center please come in thru the Main Entrance and check in at the main information desk.  You need to re-schedule your appointment should you arrive 10 or more minutes late.  We strive to give you quality time with our providers, and arriving late affects you and other patients whose appointments are after yours.  Also, if you no show three or more times for appointments you may be dismissed from the clinic at the providers discretion.     Again, thank you for choosing [facility].  Our hope is that these requests will decrease the amount of time that you wait before being seen by our physicians.       _____________________________________________________________  Should you have questions after your visit to [facility], please contact our office at [phone] and follow the prompts.  Our office hours are 8:00 a.m. and 4:30 p.m. Monday - Friday.  Please note that voicemails left after 4:00 p.m. may not be returned until the following business day.  We are closed weekends and major holidays.  You do have access to a nurse 24-7, just call the main number to the clinic [phone] and do not press any options, hold on the line and a nurse will  answer the phone.    For prescription refill requests, have your pharmacy contact our office and allow 72 hours.    Due to Covid, you will need to wear a mask upon entering the hospital. If you do not have a mask, a mask will be given to you at the Main Entrance upon arrival. For doctor visits, patients may have 1 support person age 9 or older with them. For treatment visits, patients can not have anyone with them due to social distancing guidelines and our immunocompromised population.

## 2023-03-19 NOTE — Progress Notes (Signed)
 [facility] [address] [place] [address]    Clinic Day:  03/19/2023  Referring physician: Joshua Debby CROME, [name]  Patient Care Team: Joshua Debby CROME, [name] as PCP - General (Internal Medicine) Rogers Hai, [name] as Consulting Physician (Hematology and Oncology) Shaaron Lamar HERO, [name] as Consulting Physician (Gastroenterology) Celestia Joesph SQUIBB, RN as Oncology Nurse Navigator (Medical Oncology)   ASSESSMENT & PLAN:   Assessment: 1.  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  Prominent left hydronephrosis and hydroureter noted.  New sclerotic lesion in the left anterior vertebral body of L1.  Mild left periaortic adenopathy increased from prior, short axis lymph node measuring 1 cm. -Last PSA increased to 15.8 on 09/28/2019. -As there is rapid progression of disease, I have recommended docetaxel  chemotherapy.  He is agreeable after long discussion. -Cystoscopy with transurethral resection of tumor in the posterior bladder neck, right and left walls of the bladder, right lobe of the prostate on 11/10/2019. -Plan was to do 6 cycles of docetaxel  followed by abiraterone  and prednisone . -6 cycles of docetaxel  from 11/16/2019 through 02/29/2020. -CTAP on 01/06/2020 showed persistent thickening of the urinary bladder wall.  Left hydronephrosis is significantly reduced compared to prior exam.  Left-sided retroperitoneal and left iliac lymph nodes reduced in size.  Interval increase in sclerosis of lesions of the left aspect of L2 vertebral body consistent with treatment response.  No new bone lesions. -Bone scan on 01/06/2020 shows stable L2 metastatic focus. -Bone scan scan images from 06/14/2020 which showed progressive metastatic disease, with increased size and intensity of L2 1 new focus of activity at  L1. 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 -Abiraterone  and prednisone  from 06/24/2020 through 09/06/2021 with progression. - PSMA PET scan (09/28/2021): Residual/recurrent tumor involving prostate gland and seminal vesicles.  Tracer avid bone metastasis localized L1 and L2 vertebral bodies.  No new sites of bone metastasis disease.  No solid organ or nodal mets. - Darolutamide  from 10/17/2021 through 03/15/2022 with progression - Guardant360: MSI high not detected.  Androgen resistance present. - XRT to L2-L4, 30 Gray in 10 fractions completed on 04/23/2022 with improvement in pain. - Cabazitaxel  4 cycles from 05/07/2022 through 07/11/2022 with progression. - Pluvicto  cycle 1 on 09/26/2022, cycle 2 on 11/08/2022, cycle 3 on 12/28/2022 - TURP on 12/18/2022 by Dr. Watt   2.  Left hydroureteronephrosis: -CT scan showed prostate mass invading the posterior bladder and obstructing the ureter.  Creatinine increased to 1.1. -Renal ultrasound on 12/11/2019 showed interval resolution of previously seen left-sided hydronephrosis.  Previously seen soft tissue mass at the posterior bladder lumen no longer seen.    Plan: 1.  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 - He had 5 days of vomiting after last cycle.  He lost some weight and gained back.  Denied any dry mouth.  Vomiting comes on without much of nausea. - Reviewed labs from 03/14/2023: Alk phos improved to 304.  CBC grossly normal with mild anemia stable.  PSA is 392, up from 345. - Recommend proceeding with cycle 5 on 03/21/2023.  RTC 6 weeks for follow-up.   2.  Hot flashes: - Continue estradiol  twice daily for hot flashes.   3.  Nausea/vomiting: - Continue dexamethasone  daily for 3 days after next dose of Pluvicto . -  Continue Zofran  alternating with Compazine  for nausea and vomiting.   4.  Lower back pain/bilateral hip pains: - Continue oxycodone  10 mg every 6 hours as needed.  5.  Appetite: - He takes  Megace  1-2 times daily when he remembers it.  He is eating better.  No orders of the defined types were placed in this encounter.     LILLETTE Scott [name] Teague,acting as a neurosurgeon for Scott [name].,have documented all relevant documentation on the behalf of Scott [name],as directed by  Scott [name] while in the presence of Scott [name].  I, Scott Stands [name], have reviewed the above documentation for accuracy and completeness, and I agree with the above.     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    Prior Therapy: 1. TURP on 11/10/2019. 2. Docetaxel  x 6 cycles, 11/16/2019 - 02/29/2020. 3.  Abiraterone  and prednisone , 06/24/20 - 09/06/21 4.  Darolutamide , 10/17/21 - 03/15/22  Current Therapy:  Cabazitaxel  every 21 days    HISTORY OF PRESENT ILLNESS:   Oncology History  Prostate cancer metastatic to multiple sites [facility])  01/25/2017 Initial Diagnosis   Prostate cancer metastatic to multiple sites [facility])   11/16/2019 - 03/02/2020 Chemotherapy   Patient is on Treatment Plan : PROSTATE Docetaxel  + Prednisone  q21d      Genetic Testing   Negative genetic testing. No pathogenic variants identified on the [facility] CancerNext-Expanded panel. 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 The report date is 04/19/2020.  The CancerNext-Expanded  gene panel offered by [facility] and includes sequencing and rearrangement analysis for the following 77 genes: IP, ALK, APC*, ATM*, AXIN2, BAP1, BARD1, BLM, BMPR1A, BRCA1*, BRCA2*, BRIP1*, CDC73, CDH1*,CDK4, CDKN1B, CDKN2A, CHEK2*, CTNNA1, DICER1, FANCC, FH, FLCN, GALNT12, KIF1B, LZTR1, MAX, MEN1, MET, MLH1*, MSH2*, MSH3, MSH6*, MUTYH*, NBN, NF1*, NF2, NTHL1, PALB2*, PHOX2B, PMS2*, POT1, PRKAR1A, PTCH1, PTEN*, RAD51C*, RAD51D*,RB1, RECQL, RET, SDHA,  SDHAF2, SDHB, SDHC, SDHD, SMAD4, SMARCA4, SMARCB1, SMARCE1, STK11, SUFU, TMEM127, TP53*,TSC1, TSC2, VHL and XRCC2 (sequencing and deletion/duplication); EGFR, EGLN1, HOXB13, KIT, MITF, PDGFRA, POLD1 and POLE (sequencing only); EPCAM and GREM1 (deletion/duplication only).    05/07/2022 -  Chemotherapy   Patient is on Treatment Plan : PROSTATE Cabazitaxel  (20) D1 + Prednisone  D1-21 q21d        INTERVAL HISTORY:   Scott [name] is a 67 y.o. 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 He was last seen by me on 01/30/23.  Today, he states that he is doing well overall. His appetite level is at 75%. His energy level is at 25%. He is accompanied by his wife.   He reports tiredness. He has lost 5 pounds since his last visit, which he attributes to his vomiting spells. He is taking Megace  prn, as he believes his appetite has improved. He denies any dry mouth. He notes on and off nausea with multiple episodes of vomiting lasting 5-6 days 1 week treatment. His lower back pain and bilateral hip pain is worsened in the late afternoon, evening, and in the morning. He is not taking Norco or Tramadol . He is taking oxycodone  q6h. He is taking steroids and compazine  as prescribed.   He notes bilateral shins and back ache. His wife notes he is sleeping less, which is improved as he before he could not stay awake for  more than 3-4 hours at a time.   PAST MEDICAL HISTORY:   Past Medical History: Past Medical History:  Diagnosis Date   Anemia    Bladder obstruction    Complication of anesthesia    Diverticulitis 2015   Dyspnea    Essential hypertension, benign 01/26/2019   at doctor's office elevated, normal at home, no medications at this time   GERD (gastroesophageal reflux disease)    History of basal cell cancer    HLD (hyperlipidemia) 01/26/2019   Hypothyroidism    history of, not currently taking medication   PONV (postoperative nausea and  vomiting)    Prostate cancer (HCC)    metastatic to bones   Vitamin D  deficiency disease 01/26/2019    Surgical History: Past Surgical History:  Procedure Laterality Date   CYSTOSCOPY W/ URETERAL STENT PLACEMENT Left 11/10/2019   Procedure: CYSTOSCOPY;  Surgeon: Watt Rush, [name];  Location: [facility];  Service: Urology;  Laterality: Left;   ESOPHAGOGASTRODUODENOSCOPY (EGD) WITH PROPOFOL  N/A 07/07/2020   Procedure: ESOPHAGOGASTRODUODENOSCOPY (EGD) WITH PROPOFOL ;  Surgeon: Shaaron Lamar HERO, [name];  Location: [facility];  Service: Endoscopy;  Laterality: N/A;  pm appt   HERNIA REPAIR  1995   UMBILICAL    IR FLUORO GUIDED NEEDLE PLC ASPIRATION/INJECTION LOC  04/09/2022   IR IMAGING GUIDED PORT INSERTION  10/11/2022   MALONEY DILATION N/A 07/07/2020   Procedure: AGAPITO DILATION;  Surgeon: Shaaron Lamar HERO, [name];  [facility];  Service: Endoscopy;  Laterality: N/A;   PROSTATE SURGERY     SKIN CANCER EXCISION     TONSILLECTOMY Bilateral    TOOTH EXTRACTION     front left tooth pulled but no surgery or stitches   TRANSURETHRAL RESECTION OF BLADDER TUMOR N/A 12/18/2022   Procedure: REMOVAL OF BLADDER STONES;  Surgeon: Watt Rush, [name];  Location: [facility];  Service: Urology;  Laterality: N/A;  1 HR FOR CASE   TRANSURETHRAL RESECTION OF PROSTATE N/A 01/25/2017   Procedure: TRANSURETHRAL RESECTION OF THE PROSTATE (TURP);  Surgeon: Alvaro Hummer, [name];  Location: [facility];  Service: Urology;  Laterality: N/A;   TRANSURETHRAL RESECTION OF PROSTATE N/A 11/10/2019   Procedure: TRANSURETHRAL RESECTION OF THE PROSTATE (TURP);  Surgeon: Watt Rush, [name];  Location: [facility];  Service: Urology;  Laterality: N/A;   TRANSURETHRAL RESECTION OF PROSTATE N/A 12/18/2022   Procedure: TRANSURETHRAL RESECTION OF THE PROSTATE (TURP);  Surgeon: Watt Rush, [name];  Location: [facility];  Service: Urology;  Laterality: N/A;   WISDOM TOOTH EXTRACTION      Social History: Social History   Socioeconomic History   Marital status:  Married    Spouse name: Not on file   Number of children: 1   Years of education: Not on file   Highest education level: Bachelor's degree (e.g., BA, AB, BS)  Occupational History    Comment: working full time  Tobacco Use   Smoking status: Former    Current packs/day: 0.00    Average packs/day: 1.5 packs/day for 35.0 years (52.5 ttl pk-yrs)    Types: Cigarettes    Start date: 01/11/1969    Quit date: 01/12/2004    Years since quitting: 19.1   Smokeless tobacco: Never   Tobacco comments:    OVER 30 YEARS HX OF SMOKING   Vaping Use   Vaping status: Never Used  Substance and Sexual Activity   Alcohol use: Not Currently    Comment: heavy drinker until 2004   Drug use: Not Currently    Types:  Marijuana   Sexual activity: Not Currently  Other Topics Concern   Not on file  Social History Narrative   Married for 19 years.Works for [employer] from home.   Social Drivers of Corporate Investment Banker Strain: Low Risk  (08/24/2022)   Overall Financial Resource Strain (CARDIA)    Difficulty of Paying Living Expenses: Not very hard  Food Insecurity: No Food Insecurity (12/18/2022)   Hunger Vital Sign    Worried About Running Out of Food in the Last Year: Never true    Ran Out of Food in the Last Year: Never true  Transportation Needs: No Transportation Needs (12/18/2022)   PRAPARE - Administrator, Civil Service (Medical): No    Lack of Transportation (Non-Medical): No  Physical Activity: Insufficiently Active (08/24/2022)   Exercise Vital Sign    Days of Exercise per Week: 5 days    Minutes of Exercise per Session: 20 min  Stress: Stress Concern Present (08/24/2022)   [employer] of Occupational Health - Occupational Stress Questionnaire    Feeling of Stress : To some extent  Social Connections: Socially Isolated (08/24/2022)   Social Connection and Isolation Panel [NHANES]    Frequency of Communication with Friends and Family: Once a week    Frequency of  Social Gatherings with Friends and Family: Once a week    Attends Religious Services: Never    Database Administrator or Organizations: No    Attends Engineer, Structural: Not on file    Marital Status: Married  Catering Manager Violence: Not At Risk (12/18/2022)   Humiliation, Afraid, Rape, and Kick questionnaire    Fear of Current or Ex-Partner: No    Emotionally Abused: No    Physically Abused: No    Sexually Abused: No    Family History: Family History  Problem Relation Age of Onset   Emphysema Mother    Alzheimer's disease Father    Prostate cancer Father        dx in his 60s   Prostate cancer Brother        dx in his 70s   Breast cancer Maternal Grandmother        60s   Pancreatic cancer Maternal Grandfather        93s   Dementia Paternal Grandmother    Heart attack Paternal Grandfather    Colon cancer Neg Hx     Current Medications:  Current Outpatient Medications:    Cholecalciferol  125 MCG (5000 UT) capsule, Take 10,000 Units by mouth daily., Disp: , Rfl:    COMPRO  25 MG suppository, Place 25 mg rectally every 8 (eight) hours as needed for nausea or vomiting., Disp: , Rfl:    dexamethasone  (DECADRON ) 4 MG tablet, Take 1 tablet (4 mg total) by mouth daily. Take for three days after each Pluvicto  treatment, Disp: 30 tablet, Rfl: 0   docusate sodium  (COLACE) 100 MG capsule, Take 100 mg by mouth daily., Disp: , Rfl:    dronabinol  (MARINOL ) 5 MG capsule, Take 1 capsule (5 mg total) by mouth 2 (two) times daily before lunch and supper., Disp: 60 capsule, Rfl: 1   dutasteride  (AVODART ) 0.5 MG capsule, TAKE 1 CAPSULE(0.5 MG) BY MOUTH DAILY, Disp: 90 capsule, Rfl: 3   Elastic Bandages & Supports (B-4 MED COMPRESSION HOSE MENS) MISC, Wear as needed when awake to help prevent leg swelling, Disp: 2 each, Rfl: 0   estradiol  (VIVELLE -DOT) 0.1 MG/24HR patch, APPLY 1 PATCH(0.1 MG) TOPICALLY TO THE  SKIN 2 TIMES A WEEK, Disp: 8 patch, Rfl: 12   MAGNESIUM  PO, Take 1 tablet by  mouth at bedtime., Disp: , Rfl:    megestrol  (MEGACE ) 40 MG/ML suspension, Take 10 mLs (400 mg total) by mouth 2 (two) times daily., Disp: 480 mL, Rfl: 2   MELATONIN PO, Take 40 mg by mouth at bedtime., Disp: , Rfl:    Multiple Vitamin (MULTIVITAMIN WITH MINERALS) TABS tablet, Take 1 tablet by mouth daily., Disp: , Rfl:    NATTOKINASE PO, Take 2 capsules by mouth daily., Disp: , Rfl:    NP THYROID  120 MG tablet, Take 0.5 tablets (60 mg total) by mouth every morning., Disp: 45 tablet, Rfl: 1   ondansetron  (ZOFRAN  ODT) 8 MG disintegrating tablet, Take 1 tablet (8 mg total) by mouth every 8 (eight) hours as needed for nausea or vomiting., Disp: 60 tablet, Rfl: 3   Oxycodone  HCl 10 MG TABS, Take 1 tablet (10 mg total) by mouth every 6 (six) hours as needed., Disp: 120 tablet, Rfl: 0   phenazopyridine  (PYRIDIUM ) 200 MG tablet, Take 1 tablet (200 mg total) by mouth 3 (three) times daily., Disp: 45 tablet, Rfl: 0   prochlorperazine  (COMPAZINE ) 10 MG tablet, Take 1 tablet (10 mg total) by mouth every 6 (six) hours as needed for nausea or vomiting., Disp: 60 tablet, Rfl: 3   pyridOXINE (VITAMIN B6) 25 MG tablet, Take 25 mg by mouth daily., Disp: , Rfl:    thiamine (VITAMIN B-1) 50 MG tablet, Take 1 tablet (50 mg total) by mouth daily., Disp: 90 tablet, Rfl: 1   traMADol  (ULTRAM ) 50 MG tablet, Take 2 tablets (100 mg total) by mouth every 6 (six) hours as needed., Disp: 120 tablet, Rfl: 0   vitamin E 180 MG (400 UNITS) capsule, Take 400 Units by mouth daily., Disp: , Rfl:    VITAMIN K PO, Take 1 capsule by mouth daily., Disp: , Rfl:    Allergies: Allergies  Allergen Reactions   Crestor  [Rosuvastatin ] Other (See Comments)    myalgia   Ivp Dye [Iodinated Contrast Media] Swelling    Swelling on head, took Benadryl  and swelling reduced   Wellbutrin [Bupropion] Hives    REVIEW OF SYSTEMS:   Review of Systems  Constitutional:  Negative for chills, fatigue and fever.  HENT:   Negative for lump/mass,  mouth sores, nosebleeds, sore throat and trouble swallowing.   Eyes:  Negative for eye problems.  Respiratory:  Positive for shortness of breath. Negative for cough.   Cardiovascular:  Negative for chest pain, leg swelling and palpitations.  Gastrointestinal:  Positive for constipation (occasional), nausea and vomiting. Negative for abdominal pain and diarrhea.  Genitourinary:  Negative for bladder incontinence, difficulty urinating, dysuria, frequency, hematuria and nocturia.   Musculoskeletal:  Positive for back pain (7/10 severity). Negative for arthralgias, flank pain, myalgias and neck pain.  Skin:  Negative for itching and rash.  Neurological:  Negative for dizziness, headaches and numbness.  Hematological:  Does not bruise/bleed easily.  Psychiatric/Behavioral:  Negative for depression, sleep disturbance and suicidal ideas. The patient is not nervous/anxious.   All other systems reviewed and are negative.    VITALS:   Blood pressure (!) 141/72, pulse 70, temperature 98.6 F (37 C), temperature source Oral, resp. rate 17, weight 147 lb 11.2 oz (67 kg), SpO2 100%.  Wt Readings from Last 3 Encounters:  03/19/23 147 lb 11.2 oz (67 kg)  01/30/23 153 lb 1.6 oz (69.4 kg)  01/02/23 150 lb 14.4 oz (  68.4 kg)    Body mass index is 23.84 kg/m.  Performance status (ECOG): 1 - Symptomatic but completely ambulatory  PHYSICAL EXAM:   Physical Exam Vitals and nursing note reviewed. Exam conducted with a chaperone present.  Constitutional:      Appearance: Normal appearance.  Cardiovascular:     Rate and Rhythm: Normal rate and regular rhythm.     Pulses: Normal pulses.     Heart sounds: Normal heart sounds.  Pulmonary:     Effort: Pulmonary effort is normal.     Breath sounds: Normal breath sounds.  Abdominal:     Palpations: Abdomen is soft. There is no hepatomegaly, splenomegaly or mass.     Tenderness: There is no abdominal tenderness.  Musculoskeletal:     Right lower leg: No  edema.     Left lower leg: No edema.  Lymphadenopathy:     Cervical: No cervical adenopathy.     Right cervical: No superficial, deep or posterior cervical adenopathy.    Left cervical: No superficial, deep or posterior cervical adenopathy.     Upper Body:     Right upper body: No supraclavicular or axillary adenopathy.     Left upper body: No supraclavicular or axillary adenopathy.  Neurological:     General: No focal deficit present.     Mental Status: He is alert and oriented to person, place, and time.  Psychiatric:        Mood and Affect: Mood normal.        Behavior: Behavior normal.     LABS:      Latest Ref Rng & Units 03/14/2023   11:10 AM 01/29/2023   11:17 AM 01/02/2023   10:47 AM  CBC  WBC 4.0 - 10.5 K/uL 4.8  4.2  8.6   Hemoglobin 13.0 - 17.0 g/dL 9.6  9.1  89.8   Hematocrit 39.0 - 52.0 % 28.5  27.6  30.4   Platelets 150 - 400 K/uL 264  275  277       Latest Ref Rng & Units 03/14/2023   11:10 AM 01/29/2023   11:17 AM 01/02/2023   10:47 AM  CMP  Glucose 70 - 99 mg/dL 95  99  93   BUN 8 - 23 mg/dL 10  8  14    Creatinine 0.61 - 1.24 mg/dL 9.34  9.31  9.27   Sodium 135 - 145 mmol/L 132  130  126   Potassium 3.5 - 5.1 mmol/L 3.6  3.7  3.4   Chloride 98 - 111 mmol/L 104  103  98   CO2 22 - 32 mmol/L 19  19  18    Calcium  8.9 - 10.3 mg/dL 8.6  8.1  7.8   Total Protein 6.5 - 8.1 g/dL 5.9  6.0  5.8   Total Bilirubin <1.2 mg/dL 0.6  0.4  0.7   Alkaline Phos 38 - 126 U/L 304  343  498   AST 15 - 41 U/L 18  18  39   ALT 0 - 44 U/L 13  14  63      No results found for: CEA1, CEA / No results found for: CEA1, CEA Lab Results  Component Value Date   PSA1 12.9 (H) 05/26/2020   No results found for: CAN199 No results found for: CAN125  No results found for: TOTALPROTELP, ALBUMINELP, A1GS, A2GS, BETS, BETA2SER, GAMS, MSPIKE, SPEI Lab Results  Component Value Date   TIBC 336.0 01/09/2021   TIBC 280 04/20/2020  FERRITIN 69.8  01/09/2021   FERRITIN 45 04/20/2020   IRONPCTSAT 45.2 01/09/2021   IRONPCTSAT 31 04/20/2020   No results found for: LDH   STUDIES:   No results found.

## 2023-03-21 ENCOUNTER — Encounter: Payer: Self-pay | Admitting: Hematology

## 2023-03-21 ENCOUNTER — Ambulatory Visit (HOSPITAL_COMMUNITY)
Admission: RE | Admit: 2023-03-21 | Discharge: 2023-03-21 | Disposition: A | Discharge status: Home / Self Care | Payer: Medicare Other | Source: Ambulatory Visit | Attending: Hematology | Admitting: Hematology

## 2023-03-21 DIAGNOSIS — C61 Malignant neoplasm of prostate: Secondary | ICD-10-CM | POA: Insufficient documentation

## 2023-03-21 DIAGNOSIS — C7951 Secondary malignant neoplasm of bone: Secondary | ICD-10-CM | POA: Diagnosis present

## 2023-03-21 MED ORDER — LUTETIUM LU 177 VIPIVOTIDE TET 1000 MBQ/ML IV SOLN
209.9400 | Freq: Once | INTRAVENOUS | Status: AC
  Administered 2023-03-21: 209.94 via INTRAVENOUS

## 2023-03-21 MED ORDER — SODIUM CHLORIDE 0.9 % IV SOLN
Freq: Once | INTRAVENOUS | Status: AC
  Filled 2023-03-21: qty 5

## 2023-03-21 MED ORDER — PALONOSETRON HCL INJECTION 0.25 MG/5ML
0.2500 mg | Freq: Once | INTRAVENOUS | Status: AC
Start: 2023-03-21 — End: 2023-03-21
  Administered 2023-03-21: 0.25 mg via INTRAVENOUS
  Filled 2023-03-21: qty 5

## 2023-03-21 MED ORDER — SODIUM CHLORIDE 0.9 % IV BOLUS: 1000.0000 mL | Freq: Once | INTRAVENOUS | Status: DC

## 2023-03-21 MED ORDER — SODIUM CHLORIDE 0.9 % IV SOLN
INTRAVENOUS | Status: AC
Start: 2023-03-21 — End: 2023-03-21

## 2023-03-21 NOTE — Written Directive (Addendum)
  PLUVICTO   THERAPY   RADIOPHARMACEUTICAL: Lutetium 177 vipivotide tetraxetan (Pluvicto )     PRESCRIBED DOSE FOR ADMINISTRATION:  200 mCi   ROUTE OFADMINISTRATION:  IV   DIAGNOSIS:  prostate cancer, Prostate cancer metastatic to bone   REFERRING PHYSICIAN:Katragadda   TREATMENT #: [id number]   ADDITIONAL PHYSICIAN COMMENTS/NOTES:   AUTHORIZED USER SIGNATURE & TIME STAMP: Norleen GORMAN Boxer, MD   03/21/23    8:12 AM

## 2023-03-21 NOTE — Progress Notes (Signed)
 CLINICAL DATA: 68 year old male. multifocal castrate resistant prostate cancer metastasis.]    EXAM: NUCLEAR MEDICINE PLUVICTO  INJECTION  TECHNIQUE: Infusion: The nuclear medicine technologist and I personally verified the dose activity to be delivered as specified in the written directive, and verified the patient identification via 2 separate methods.  Initial flush of the intravenous catheter was performed was sterile saline. The dose syringe was connected to the catheter and the Lu-177 Pluvicto  administered over a 1 to 10 min infusion. Single 10 cc  lushes with normal saline follow the dose. No complications were noted. The entire IV tubing, venocatheter, stopcock and syringes was removed in total, placed in a disposal bag and sent for assay of the residual activity, which will be reported at a later time in our EMR by the physics staff. Pressure was applied to the venipuncture site, and a compression bandage placed. Patient monitored for 1 hour following infusion.    Radiation Safety personnel were present to perform the discharge survey, as detailed on their documentation. After a short period of observation, the patient had his IV removed.  RADIOPHARMACEUTICALS: [209.94] microcuries Lu-177 PLUVICTO   FINDINGS: Current Infusion: [5]  Planned Infusions: 6    Patient presented to nuclear medicine for treatment. The patient's most recent blood counts were reviewed and remains a good candidate to proceed with Lu-177 Pluvicto .    Patient CBC is stable with hemoglobin 9.6.  Normal creatinine.  Alkaline phosphatase is elevated to 304.     PSA equal 392 similar to 345 one month prior and much improved from grade 1,500 four  months prior.    Patient reports no interval adverse effects some gaining of strength.     The patient was situated in an infusion suite with a contact barrier placed under the arm. Intravenous access was established, using sterile technique, and a normal saline  infusion from a syringe was started.     Micro-dosimetry: The prescribed radiation activity was assayed and confirmed to be within specified tolerance.  IMPRESSION: Current Infusion: [5]  Planned Infusions: 6    [The patient tolerated the infusion well. The patient will return in 6 weeks for ongoing care.]

## 2023-03-22 ENCOUNTER — Other Ambulatory Visit: Payer: Self-pay

## 2023-04-02 ENCOUNTER — Other Ambulatory Visit: Payer: Self-pay | Admitting: *Deleted

## 2023-04-02 MED ORDER — OXYCODONE HCL 10 MG PO TABS: 10.0000 mg | ORAL_TABLET | Freq: Four times a day (QID) | ORAL | 0 refills | Status: DC | PRN

## 2023-04-03 ENCOUNTER — Telehealth: Payer: Self-pay

## 2023-04-03 NOTE — Telephone Encounter (Signed)
 Notified Patient of prior authorization approval for Oxycodone  HCL 10 mg Tablets. Medication is approved until 03/18/2024. Pharmacy notified. No other needs or concerns voiced at this time.

## 2023-04-11 ENCOUNTER — Inpatient Hospital Stay: Payer: Medicare Other

## 2023-04-18 ENCOUNTER — Encounter: Payer: Self-pay | Admitting: Hematology

## 2023-04-18 ENCOUNTER — Ambulatory Visit (INDEPENDENT_AMBULATORY_CARE_PROVIDER_SITE_OTHER): Payer: Medicare Other | Admitting: Urology

## 2023-04-18 ENCOUNTER — Other Ambulatory Visit: Payer: Medicare Other | Admitting: Urology

## 2023-04-18 VITALS — BP 135/79 | HR 92

## 2023-04-18 DIAGNOSIS — R3912 Poor urinary stream: Secondary | ICD-10-CM

## 2023-04-18 DIAGNOSIS — C61 Malignant neoplasm of prostate: Secondary | ICD-10-CM | POA: Diagnosis not present

## 2023-04-18 DIAGNOSIS — Z87440 Personal history of urinary (tract) infections: Secondary | ICD-10-CM | POA: Diagnosis not present

## 2023-04-18 DIAGNOSIS — R399 Unspecified symptoms and signs involving the genitourinary system: Secondary | ICD-10-CM | POA: Diagnosis not present

## 2023-04-18 LAB — BLADDER SCAN: Scan Result: 3

## 2023-04-18 NOTE — Progress Notes (Signed)
post void residual=3

## 2023-04-18 NOTE — Progress Notes (Signed)
Prostate cancer metastatic to multiple sites [facility]) - Plan: Urinalysis, Routine w reflex microscopic  Weak urinary stream - Plan: Urine Culture, Bladder scan  History of UTI  UTI symptoms  Subjective:  04/18/23: Will returns today in f/u for mCRCP.  I did a TURP on 12/18/22 for locally recurrent disease with dystrophic calcifications.  He remains on estradiol, dutasteride and lupron with last dose on 01/14/23.  He is now on Pluvicto and his PSA on 03/14/23 was 392 which was up from 345 on 01/29/23.  His IPSS is 19 with a progressively slower stream over the last week or so. He is due for additional imaging in March.  He has lost weight since his last visit.  He continues to have pain and is on Oxycodone 4x daily.  He has had no hematuria.  He has some dysuria.  He is anemic with a Hgb of 9.6.  UA has 11-30 WBC and 11-30 RBC with a few bacteria.    12/13/22: Will returns today in f/u.  He was in the ER 9 days ago with difficulty voiding.  With nocturia 30-40x.  He had no hematuria.  He had dysuria and ankle edema.  His UA was negative but he had some benefit from pyridium.  He is currently on Pluvicto in addition to his ADT with Leuprolide, dutasteride and estradiol patches.  He is on tramadol and hydrocodone.  His IPSS is 19 with urgency and nocturia.  He had a CT that shows probable regrowth at the bladder neck.  He has no SUI but occasional UUI.  His PSA has fallen from >1500 to 734 with 2 rounds of Pluvicto.   .   06/14/22: Will returns today in f/u.  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  He had radation to spinal mets with the most recent at L4  in 1/24.  His PSA was 53.59 in 2/24 and was 44.4 on 05/30/22. He remains on dutasteride and estradiol patches.  He has discussed pluvicto but hasn't initiated that.  He has had germline and somatic testing with no actionable mutations per his report.   He has some increased LUTS but his IPSS is 6.  He has nocturia x 1-2 and occasional urgency  with UUI.  He has had  12/07/21: Will returns today in f/u for leuprolide for his history of CRCP.  His PSA has continued to rise and was 3.58 in 6/23 and is now 9.84.  He last saw Scott [name] on 11/01/21 and was switched to Darolutamide from abiaterone on 10/17/21.   He remains on dutasteride and estradiol patches as well. He had a PSMA PET on 09/28/21 and was noted to have tumor of the prostate gland and SV's and mets at L1 and L2.  There was no hydro noted.   He is on calcium and Vit D.  He had some increased voiding symptoms after starting the darolutamide but that has improved.  His IPSS is 7.  He has some low back and hip pain.  He is starting to losing weight after he gained on the prednisone.   He has discussed Pluvicto and is being considered for that.     06/01/21: Will returns today for Lupron.  His PSA is slowly rising and was 1.54 earlier this month.  He has fatigue and SOB. He has no further bone pain.   He has a stable weight. He is voiding well with an IPSS of 5-6 with some nocturia 1-2x.  He has had no hematuria and the UA is clear.  He continues to use estradiol and abiaterone with pred.  His hot flashes are well controlled with the estradiol.   He is on no bone targeting agents and is reluctant to add them because of dental issues.  He has started a calcium supplement.   He saw Scott [name] on 05/23/21.   12/01/20: Scott [name] returns today in f/u for Lupron.  He has been dealing with shingles on the left flank and upper leg for the last 3-4 weeks but that is improving.  His last PSA in 8/22 was down to 0.89.  He started Zytiga in 4/22 and had lumbar SBRT in 5/22.   He has done well with that and his bone pain has improved.  He is voiding well with an IPSS of 2-3.  His Alk phos has normalized.    06/02/20: Scott [name] returns in f/u for his history of CRCP.  His PSA has been rising despite 6 rounds of chemo.   He is seeing Scott [name] and is on oral estradiol 1mg  po bid but the hot flashes have  returned.   He remains on dutasteride and Lupron and his T is  <3 and PSA is up to 12.9 on 05/26/20 from 4.77 on 04/28/20.  He is voiding well and his Cr was 0.8 on 2/10.  He has back pain that improved with chemo and prednisone but it has recurred.   His bone scan on 01/14/20 demonstrated stable L2 mets.  The CT showed improved left hydro with minimal residual bilateral hydro.  He is due for Lupron.  He is voiding well.  His IPSS is 6.  He has a tooth that needs extraction.     12/04/19: Scott [name] returns today in f/u from a TURP on 11/10/19 with resection of both UO's for malignant obstruction.   He is voiding better but has some frequency and nocturia 2-4x.  He has some left flank pain with voiding and probably has reflux.   His IPSS is 14.   He has had some hematuria but is improving.   He has some bone pain with the fulphila given for bone marrow support.   He is otherwise tolerating chemo after one dose.  His Cr is down to 0.9 from 1.1 prior to the resection.   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 CT with bilateral non-bulky iliac adenopathy and tiny uptake Lt orbit (no spine mets) by bone scan.  His PSA is up to 15.5 from  9.47 at last check and a doubling over the last year. The testosterone remains castrate at <3 on Lupron and dutasteride. He remains on estradiol 0.1mg  patches for the hot flashes.   He had a telehealth visit with Scott [name] for consideration of radiation therapy to the primary and pelvic nodes from the Axumin PET findings on 12/11/18 but decided against that.  He is seeing Scott [name] as well but is  reluctant to consider Zytiga because of the prednisone.   He has had no recent hematuria.  He has occasional frequency with urgency and UUI.  He wears a pad. He has a reduced stream.  He has nocturia 3-4x but he is drinking a lot of fluids.    His IPSS is 29-30.   Present Management:  01/2017 begin Lupron  androgen deprivation Q9mo (declined reccomended orchiectomy)  04/2017 - PSA 2.86;  07/2017 PSA/T 3.44 / T 3 ==> Lupron 45mg .  10/08/17 PSA 2.1.  02/07/18 PSA 2.19/T 4. added Estradiol 0.1mg  patches twice weekly for the hot flashes.  03/10/18 PSA 1.87/T<3.  05/09/18 PSA 2.09/T10/ Est 32.  07/24/18 PSA 6/T 54  08/08/18 Lupron 45mg .  09/08/18 PSA 4.37  10/22/18 PSA 4.64/T<3.  11/26/18 Bone scan negative/ CT C/A/P negative. 12/11/18 Axumin PET small equivocal periaortic and left ext iliac nodes and right prostate activity.  I have discussed that with him.  11/28/18 PSA 6.46. 02/10/19 PSA 7.31/T <3. 03/19/19 PSA 8.20 04/11/19  Lupron 45mg  given.  04/23/19 PSA 9.96. 06/23/19 PSA 9.47. 09/28/19 PSA 15.56, T <3.  10/09/19 Lupron 45mg  given 11/10/19  Operative cysto / TURP 2018 with inviltrative bladder neck / prostate mass ( left UO purposefully resected).  11/16/19 PSA 20.69.   11/06/19 Mr.Scott [name] has a mass at the base of the bladder consistent with local invasion from his CRCP and he has left ureteral obstruction with a minor increase in his Cr.  He has seen Scott [name] and is scheduled to begin chemotherapy but it was felt that decompression of the left kidney was indicated.   He is have increased voiding difficulty with passage of clots as well.    Results for NYSHAUN, STANDAGE (MRN [id number]) as of 07/10/2019 08:56  Ref. Range 11/28/2018 10:23 02/10/2019 13:25 03/19/2019 13:49 04/23/2019 13:58 06/23/2019 13:41  Prostatic Specific Antigen Latest Ref Range: 0.00 - 4.00 ng/mL 6.46 (H) 7.31 (H) 8.20 (H) 9.96 (H) 9.47 (H)      IPSS     Row Name 04/18/23 1500         International Prostate Symptom Score   How often have you had the sensation of not emptying your bladder? Less than half the time     How often have you had to urinate less than every two hours? About half the time     How often have you found you stopped and started again several times  when you urinated? Less than half the time     How often have you found it difficult to postpone urination? More than half the time     How often have you had a weak urinary stream? Almost always     How often have you had to strain to start urination? Not at All     How many times did you typically get up at night to urinate? 3 Times     Total IPSS Score 19       Quality of Life due to urinary symptoms   If you were to spend the rest of your life with your urinary condition just the way it is now how would you feel about that? Mixed               ROS:  ROS:  A complete review of systems was performed.  All systems are negative except for pertinent findings as noted.   Review of Systems  Constitutional:  Negative for weight loss.  Respiratory:  Positive for shortness of breath.   Musculoskeletal:        Bone pain in pelvis, sacrum and both hips.   Neurological:  Positive for weakness.    Allergies  Allergen Reactions   Crestor [Rosuvastatin] Other (See Comments)    myalgia   Ivp Dye [Iodinated Contrast Media] Swelling    Swelling on head, took Benadryl and swelling reduced  Wellbutrin [Bupropion] Hives    Outpatient Encounter Medications as of 04/18/2023  Medication Sig Note   Cholecalciferol 125 MCG (5000 UT) capsule Take 10,000 Units by mouth daily.    COMPRO 25 MG suppository Place 25 mg rectally every 8 (eight) hours as needed for nausea or vomiting.    dexamethasone (DECADRON) 4 MG tablet Take 1 tablet (4 mg total) by mouth daily. Take for three days after each Pluvicto treatment    docusate sodium (COLACE) 100 MG capsule Take 100 mg by mouth daily.    dronabinol (MARINOL) 5 MG capsule Take 1 capsule (5 mg total) by mouth 2 (two) times daily before lunch and supper.    dutasteride (AVODART) 0.5 MG capsule TAKE 1 CAPSULE(0.5 MG) BY MOUTH DAILY    Elastic Bandages & Supports (B-4 MED COMPRESSION HOSE MENS) MISC Wear as needed when awake to help prevent leg swelling     estradiol (VIVELLE-DOT) 0.1 MG/24HR patch APPLY 1 PATCH(0.1 MG) TOPICALLY TO THE SKIN 2 TIMES A WEEK 12/18/2022: On L hip   MAGNESIUM PO Take 1 tablet by mouth at bedtime.    megestrol (MEGACE) 40 MG/ML suspension Take 10 mLs (400 mg total) by mouth 2 (two) times daily.    MELATONIN PO Take 40 mg by mouth at bedtime.    Multiple Vitamin (MULTIVITAMIN WITH MINERALS) TABS tablet Take 1 tablet by mouth daily.    NATTOKINASE PO Take 2 capsules by mouth daily.    NP THYROID 120 MG tablet Take 0.5 tablets (60 mg total) by mouth every morning.    ondansetron (ZOFRAN ODT) 8 MG disintegrating tablet Take 1 tablet (8 mg total) by mouth every 8 (eight) hours as needed for nausea or vomiting.    Oxycodone HCl 10 MG TABS Take 1 tablet (10 mg total) by mouth every 6 (six) hours as needed.    phenazopyridine (PYRIDIUM) 200 MG tablet Take 1 tablet (200 mg total) by mouth 3 (three) times daily.    prochlorperazine (COMPAZINE) 10 MG tablet Take 1 tablet (10 mg total) by mouth every 6 (six) hours as needed for nausea or vomiting.    pyridOXINE (VITAMIN B6) 25 MG tablet Take 25 mg by mouth daily.    thiamine (VITAMIN B-1) 50 MG tablet Take 1 tablet (50 mg total) by mouth daily.    traMADol (ULTRAM) 50 MG tablet Take 2 tablets (100 mg total) by mouth every 6 (six) hours as needed.    vitamin E 180 MG (400 UNITS) capsule Take 400 Units by mouth daily.    VITAMIN K PO Take 1 capsule by mouth daily.    No facility-administered encounter medications on file as of 04/18/2023.    Past Medical History:  Diagnosis Date   Anemia    Bladder obstruction    Complication of anesthesia    Diverticulitis 2015   Dyspnea    Essential hypertension, benign 01/26/2019   at doctor's office elevated, normal at home, no medications at this time   GERD (gastroesophageal reflux disease)    History of basal cell cancer    HLD (hyperlipidemia) 01/26/2019   Hypothyroidism    history of, not currently taking medication   PONV  (postoperative nausea and vomiting)    Prostate cancer (HCC)    metastatic to bones   Vitamin D deficiency disease 01/26/2019    Past Surgical History:  Procedure Laterality Date   CYSTOSCOPY W/ URETERAL STENT PLACEMENT Left 11/10/2019   Procedure: CYSTOSCOPY;  Surgeon: Bjorn Pippin, MD;  Location: [facility];  Service: Urology;  Laterality: Left;   ESOPHAGOGASTRODUODENOSCOPY (EGD) WITH PROPOFOL N/A 07/07/2020   Procedure: ESOPHAGOGASTRODUODENOSCOPY (EGD) WITH PROPOFOL;  Surgeon: Corbin Ade, MD;  Location: [facility];  Service: Endoscopy;  Laterality: N/A;  pm appt   HERNIA REPAIR  1995   UMBILICAL    IR FLUORO GUIDED NEEDLE PLC ASPIRATION/INJECTION LOC  04/09/2022   IR IMAGING GUIDED PORT INSERTION  10/11/2022   MALONEY DILATION N/A 07/07/2020   Procedure: Elease Hashimoto DILATION;  Surgeon: Corbin Ade, MD;  Location: [facility];  Service: Endoscopy;  Laterality: N/A;   PROSTATE SURGERY     SKIN CANCER EXCISION     TONSILLECTOMY Bilateral    TOOTH EXTRACTION     front left tooth pulled but no surgery or stitches   TRANSURETHRAL RESECTION OF BLADDER TUMOR N/A 12/18/2022   Procedure: REMOVAL OF BLADDER STONES;  Surgeon: Bjorn Pippin, MD;  Location: [facility];  Service: Urology;  Laterality: N/A;  1 HR FOR CASE   TRANSURETHRAL RESECTION OF PROSTATE N/A 01/25/2017   Procedure: TRANSURETHRAL RESECTION OF THE PROSTATE (TURP);  Surgeon: Sebastian Ache, MD;  Location: [facility];  Service: Urology;  Laterality: N/A;   TRANSURETHRAL RESECTION OF PROSTATE N/A 11/10/2019   Procedure: TRANSURETHRAL RESECTION OF THE PROSTATE (TURP);  Surgeon: Bjorn Pippin, MD;  Location: [facility];  Service: Urology;  Laterality: N/A;   TRANSURETHRAL RESECTION OF PROSTATE N/A 12/18/2022   Procedure: TRANSURETHRAL RESECTION OF THE PROSTATE (TURP);  Surgeon: Bjorn Pippin, MD;  Location: [facility];  Service: Urology;  Laterality: N/A;   WISDOM TOOTH EXTRACTION      Social History   Socioeconomic History   Marital status: Married     Spouse name: Not on file   Number of children: 1   Years of education: Not on file   Highest education level: Bachelor's degree (e.g., BA, AB, BS)  Occupational History    Comment: working full time  Tobacco Use   Smoking status: Former    Current packs/day: 0.00    Average packs/day: 1.5 packs/day for 35.0 years (52.5 ttl pk-yrs)    Types: Cigarettes    Start date: 01/11/1969    Quit date: 01/12/2004    Years since quitting: 19.2   Smokeless tobacco: Never   Tobacco comments:    OVER 30 YEARS HX OF SMOKING   Vaping Use   Vaping status: Never Used  Substance and Sexual Activity   Alcohol use: Not Currently    Comment: heavy drinker until 2004   Drug use: Not Currently    Types: Marijuana   Sexual activity: Not Currently  Other Topics Concern   Not on file  Social History Narrative   Married for 19 years.Works for [employer] from home.   Social Drivers of Corporate investment banker Strain: Low Risk  (08/24/2022)   Overall Financial Resource Strain (CARDIA)    Difficulty of Paying Living Expenses: Not very hard  Food Insecurity: No Food Insecurity (12/18/2022)   Hunger Vital Sign    Worried About [name] Out of Food in the Last Year: Never true    Ran Out of Food in the Last Year: Never true  Transportation Needs: No Transportation Needs (12/18/2022)   PRAPARE - Administrator, Civil Service (Medical): No    Lack of Transportation (Non-Medical): No  Physical Activity: Insufficiently Active (08/24/2022)   Exercise Vital Sign    Days of Exercise per Week: 5 days    Minutes of Exercise per Session: 20  min  Stress: Stress Concern Present (08/24/2022)   [employer] of Occupational Health - Occupational Stress Questionnaire    Feeling of Stress : To some extent  Social Connections: Socially Isolated (08/24/2022)   Social Connection and Isolation Panel [NHANES]    Frequency of Communication with Friends and Family: Once a week    Frequency of Social  Gatherings with Friends and Family: Once a week    Attends Religious Services: Never    Database administrator or Organizations: No    Attends Engineer, structural: Not on file    Marital Status: Married  Catering manager Violence: Not At Risk (12/18/2022)   Humiliation, Afraid, Rape, and Kick questionnaire    Fear of Current or Ex-Partner: No    Emotionally Abused: No    Physically Abused: No    Sexually Abused: No    Family History  Problem Relation Age of Onset   Emphysema Mother    Alzheimer's disease Father    Prostate cancer Father        dx in his 30s   Prostate cancer Brother        dx in his 58s   Breast cancer Maternal Grandmother        60s   Pancreatic cancer Maternal Grandfather        7s   Dementia Paternal Grandmother    Heart attack Paternal Grandfather    Colon cancer Neg Hx        Objective: Vitals:   04/18/23 1530  BP: 135/79  Pulse: 92     Physical Exam Vitals reviewed.  Constitutional:      Appearance: He is ill-appearing.  Neurological:     Mental Status: He is alert.   I  Recent Results (from the past 2160 hours)  CBC with Differential     Status: Abnormal   Collection Time: 01/29/23 11:17 AM  Result Value Ref Range   WBC 4.2 4.0 - 10.5 K/uL   RBC 2.85 (L) 4.22 - 5.81 MIL/uL   Hemoglobin 9.1 (L) 13.0 - 17.0 g/dL   HCT 16.1 (L) 09.6 - 04.5 %   MCV 96.8 80.0 - 100.0 fL   MCH 31.9 26.0 - 34.0 pg   MCHC 33.0 30.0 - 36.0 g/dL   RDW 40.9 81.1 - 91.4 %   Platelets 275 150 - 400 K/uL   nRBC 0.0 0.0 - 0.2 %   Neutrophils Relative % 62 %   Neutro Abs 2.6 1.7 - 7.7 K/uL   Lymphocytes Relative 22 %   Lymphs Abs 0.9 0.7 - 4.0 K/uL   Monocytes Relative 13 %   Monocytes Absolute 0.6 0.1 - 1.0 K/uL   Eosinophils Relative 1 %   Eosinophils Absolute 0.0 0.0 - 0.5 K/uL   Basophils Relative 1 %   Basophils Absolute 0.1 0.0 - 0.1 K/uL   Immature Granulocytes 1 %   Abs Immature Granulocytes 0.02 0.00 - 0.07 K/uL    Comment:  Performed at [facility], [address], [place], [place] [address]  Comprehensive metabolic panel     Status: Abnormal   Collection Time: 01/29/23 11:17 AM  Result Value Ref Range   Sodium 130 (L) 135 - 145 mmol/L   Potassium 3.7 3.5 - 5.1 mmol/L   Chloride 103 98 - 111 mmol/L   CO2 19 (L) 22 - 32 mmol/L   Glucose, Bld 99 70 - 99 mg/dL    Comment: Glucose reference range applies only to samples taken after fasting  for at least 8 hours.   BUN 8 8 - 23 mg/dL   Creatinine, Ser 1.61 0.61 - 1.24 mg/dL   Calcium 8.1 (L) 8.9 - 10.3 mg/dL   Total Protein 6.0 (L) 6.5 - 8.1 g/dL   Albumin 3.0 (L) 3.5 - 5.0 g/dL   AST 18 15 - 41 U/L   ALT 14 0 - 44 U/L   Alkaline Phosphatase 343 (H) 38 - 126 U/L   Total Bilirubin 0.4 <1.2 mg/dL   GFR, Estimated >09 >60 mL/min    Comment: (NOTE) Calculated using the CKD-EPI Creatinine Equation (2021)    Anion gap 8 5 - 15    Comment: Performed at [facility], [address], [place], [place] [address]  PSA     Status: Abnormal   Collection Time: 01/29/23 11:17 AM  Result Value Ref Range   Prostatic Specific Antigen 345.71 (H) 0.00 - 4.00 ng/mL    Comment: (NOTE) While PSA levels of <=4.00 ng/ml are reported as reference range, some men with levels below 4.00 ng/ml can have prostate cancer and many men with PSA above 4.00 ng/ml do not have prostate cancer.  Other tests such as free PSA, age specific reference ranges, PSA velocity and PSA doubling time may be helpful especially in men less than 94 years old. Performed at [facility], [address] [place], [place] [address]   Magnesium     Status: None   Collection Time: 01/29/23 11:17 AM  Result Value Ref Range   Magnesium 2.0 1.7 - 2.4 mg/dL    Comment: Performed at [facility], [address] [place], [place] [address]  PSA     Status: Abnormal   Collection Time: 03/14/23 11:10 AM  Result Value Ref Range   Prostatic Specific Antigen 392.68 (H) 0.00 - 4.00 ng/mL    Comment:  (NOTE) While PSA levels of <=4.00 ng/ml are reported as reference range, some men with levels below 4.00 ng/ml can have prostate cancer and many men with PSA above 4.00 ng/ml do not have prostate cancer.  Other tests such as free PSA, age specific reference ranges, PSA velocity and PSA doubling time may be helpful especially in men less than 22 years old. Performed at [facility], [address], [place], [place] [address]   Magnesium     Status: None   Collection Time: 03/14/23 11:10 AM  Result Value Ref Range   Magnesium 2.0 1.7 - 2.4 mg/dL    Comment: Performed at [facility], [address], [place], [place] [address]  Comprehensive metabolic panel     Status: Abnormal   Collection Time: 03/14/23 11:10 AM  Result Value Ref Range   Sodium 132 (L) 135 - 145 mmol/L   Potassium 3.6 3.5 - 5.1 mmol/L   Chloride 104 98 - 111 mmol/L   CO2 19 (L) 22 - 32 mmol/L   Glucose, Bld 95 70 - 99 mg/dL    Comment: Glucose reference range applies only to samples taken after fasting for at least 8 hours.   BUN 10 8 - 23 mg/dL   Creatinine, Ser 8.46 0.61 - 1.24 mg/dL   Calcium 8.6 (L) 8.9 - 10.3 mg/dL   Total Protein 5.9 (L) 6.5 - 8.1 g/dL   Albumin 3.2 (L) 3.5 - 5.0 g/dL   AST 18 15 - 41 U/L   ALT 13 0 - 44 U/L   Alkaline Phosphatase 304 (H) 38 - 126 U/L   Total Bilirubin 0.6 <1.2  mg/dL   GFR, Estimated >82 >95 mL/min    Comment: (NOTE) Calculated using the CKD-EPI Creatinine Equation (2021)    Anion gap 9 5 - 15    Comment: Performed at [facility], [address], [place], [place] [address]  CBC with Differential     Status: Abnormal   Collection Time: 03/14/23 11:10 AM  Result Value Ref Range   WBC 4.8 4.0 - 10.5 K/uL   RBC 3.10 (L) 4.22 - 5.81 MIL/uL   Hemoglobin 9.6 (L) 13.0 - 17.0 g/dL   HCT 86.5 (L) 78.4 - 69.6 %   MCV 91.9 80.0 - 100.0 fL   MCH 31.0 26.0 - 34.0 pg   MCHC 33.7 30.0 - 36.0 g/dL   RDW 29.5 28.4 - 13.2 %   Platelets 264 150 - 400 K/uL   nRBC 0.0 0.0 - 0.2  %   Neutrophils Relative % 67 %   Neutro Abs 3.3 1.7 - 7.7 K/uL   Lymphocytes Relative 20 %   Lymphs Abs 1.0 0.7 - 4.0 K/uL   Monocytes Relative 10 %   Monocytes Absolute 0.5 0.1 - 1.0 K/uL   Eosinophils Relative 1 %   Eosinophils Absolute 0.0 0.0 - 0.5 K/uL   Basophils Relative 1 %   Basophils Absolute 0.0 0.0 - 0.1 K/uL   Immature Granulocytes 1 %   Abs Immature Granulocytes 0.03 0.00 - 0.07 K/uL    Comment: Performed at [facility], [address] [place], [place] [address]  Urinalysis, Routine w reflex microscopic     Status: Abnormal   Collection Time: 04/18/23  3:41 PM  Result Value Ref Range   Specific Gravity, UA 1.030 1.005 - 1.030   pH, UA 5.5 5.0 - 7.5   Color, UA Yellow Yellow   Appearance Ur Clear Clear   Leukocytes,UA Trace (A) Negative   Protein,UA 2+ (A) Negative/Trace   Glucose, UA Negative Negative   Ketones, UA Trace (A) Negative   RBC, UA 2+ (A) Negative   Bilirubin, UA Negative Negative   Urobilinogen, Ur 0.2 0.2 - 1.0 mg/dL   Nitrite, UA Negative Negative   Microscopic Examination See below:     Comment: Microscopic was indicated and was performed.  Microscopic Examination     Status: Abnormal   Collection Time: 04/18/23  3:41 PM   Urine  Result Value Ref Range   WBC, UA 11-30 (A) 0 - 5 /hpf   RBC, Urine 11-30 (A) 0 - 2 /hpf   Epithelial Cells (non renal) 0-10 0 - 10 /hpf   Bacteria, UA Few (A) None seen/Few  Bladder scan     Status: None   Collection Time: 04/18/23  3:54 PM  Result Value Ref Range   Scan Result 3    I reviewed the records from his visits with Scott [name] and his CT report.  NM PLUVICTO ADMINISTRATION Result Date: 03/21/2023 CLINICAL DATA:  68 year old male. multifocal castrate resistant prostate cancer metastasis. EXAM: NUCLEAR MEDICINE PLUVICTO INJECTION TECHNIQUE: Infusion: The nuclear medicine technologist and I personally verified the dose activity to be delivered as specified in the written directive, and verified the  patient identification via 2 separate methods. Initial flush of the intravenous catheter was performed was sterile saline. The dose syringe was connected to the catheter and the [id number] Pluvicto administered over a 1 to 10 min infusion. Single 10 cc lushes with normal saline follow the dose. No complications were noted. The entire IV tubing, venocatheter, stopcock and syringes was removed in  total, placed in a disposal bag and sent for assay of the residual activity, which will be reported at a later time in our EMR by the physics staff. Pressure was applied to the venipuncture site, and a compression bandage placed. Patient monitored for 1 hour following infusion. Radiation Safety personnel were present to perform the discharge survey, as detailed on their documentation. After a short period of observation, the patient had his IV removed. RADIOPHARMACEUTICALS:  209.94 microcuries Lu-177 PLUVICTO FINDINGS: Current Infusion: 5 Planned Infusions: 6 Patient presented to nuclear medicine for treatment. The patient's most recent blood counts were reviewed and remains a good candidate to proceed with Lu-177 Pluvicto. Patient CBC is stable with hemoglobin 9.6. Normal creatinine. Alkaline phosphatase is elevated to 304. PSA equal 392 similar to 345 one month prior and much improved from grade 1,500 four months prior. Patient reports no interval adverse effects some gaining of strength. The patient was situated in an infusion suite with a contact barrier placed under the arm. Intravenous access was established, using sterile technique, and a normal saline infusion from a syringe was started. Micro-dosimetry: The prescribed radiation activity was assayed and confirmed to be within specified tolerance. IMPRESSION: Current Infusion: 5 Planned Infusions: 6 The patient tolerated the infusion well. The patient will return in 6 weeks for ongoing care. Electronically Signed   By: Genevive Bi M.D.   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  He improved post TURP but his stream is slowing again.  He is currently on Pluvicto.   Nodular prostate with obstruction.  He has a declining stream but is emptying well.  He may have a UTI today so I will get a culture and treat if needed.  He will return in 2 weeks for a flowrate and cystoscopy.      History of Left ureteral obstruction.   He has no left flank pain.  He will have f/u imaging in March.   Continue Luprolide 45mg  q89mo and dutasteride.  He will need his next Lupron at his postop visit.   The estradiol is managed by Scott [name]    No orders of the defined types were placed in this encounter.    Orders Placed This Encounter  Procedures   Urine Culture   Microscopic Examination   Urinalysis, Routine w reflex microscopic   Bladder scan       Return in about 2 weeks (around 05/02/2023) for for a flowrate and cystoscopy.   .   CC: Etta Grandchild, MD, Dr. Doreatha Massed.     Bjorn Pippin 04/19/2023 Patient ID: Scott [name], male   DOB: 1955-05-22, 68 y.o.   MRN: [id number]

## 2023-04-19 ENCOUNTER — Encounter: Payer: Self-pay | Admitting: Hematology

## 2023-04-19 LAB — URINALYSIS, ROUTINE W REFLEX MICROSCOPIC
Bilirubin, UA: NEGATIVE
Glucose, UA: NEGATIVE
Nitrite, UA: NEGATIVE
Specific Gravity, UA: 1.03 (ref 1.005–1.030)
Urobilinogen, Ur: 0.2 mg/dL (ref 0.2–1.0)
pH, UA: 5.5 (ref 5.0–7.5)

## 2023-04-19 LAB — MICROSCOPIC EXAMINATION

## 2023-04-20 LAB — URINE CULTURE: Organism ID, Bacteria: NO GROWTH

## 2023-04-25 ENCOUNTER — Inpatient Hospital Stay: Payer: Medicare Other | Attending: Hematology

## 2023-04-25 DIAGNOSIS — C61 Malignant neoplasm of prostate: Secondary | ICD-10-CM | POA: Diagnosis present

## 2023-04-25 LAB — COMPREHENSIVE METABOLIC PANEL
ALT: 10 U/L (ref 0–44)
AST: 19 U/L (ref 15–41)
Albumin: 3 g/dL — ABNORMAL LOW (ref 3.5–5.0)
Alkaline Phosphatase: 426 U/L — ABNORMAL HIGH (ref 38–126)
Anion gap: 9 (ref 5–15)
BUN: 9 mg/dL (ref 8–23)
CO2: 18 mmol/L — ABNORMAL LOW (ref 22–32)
Calcium: 8.6 mg/dL — ABNORMAL LOW (ref 8.9–10.3)
Chloride: 103 mmol/L (ref 98–111)
Creatinine, Ser: 0.63 mg/dL (ref 0.61–1.24)
GFR, Estimated: >60 mL/min (ref >60)
Glucose, Bld: 109 mg/dL — ABNORMAL HIGH (ref 70–99)
Potassium: 3.7 mmol/L (ref 3.5–5.1)
Sodium: 130 mmol/L — ABNORMAL LOW (ref 135–145)
Total Bilirubin: 0.6 mg/dL (ref 0.0–1.2)
Total Protein: 5.9 g/dL — ABNORMAL LOW (ref 6.5–8.1)

## 2023-04-25 LAB — CBC WITH DIFFERENTIAL/PLATELET
Abs Immature Granulocytes: 0.06 10*3/uL (ref 0.00–0.07)
Basophils Absolute: 0 10*3/uL (ref 0.0–0.1)
Basophils Relative: 1 %
Eosinophils Absolute: 0 10*3/uL (ref 0.0–0.5)
Eosinophils Relative: 0 %
HCT: 27.3 % — ABNORMAL LOW (ref 39.0–52.0)
Hemoglobin: 9.2 g/dL — ABNORMAL LOW (ref 13.0–17.0)
Immature Granulocytes: 1 %
Lymphocytes Relative: 21 %
Lymphs Abs: 1 10*3/uL (ref 0.7–4.0)
MCH: 30.8 pg (ref 26.0–34.0)
MCHC: 33.7 g/dL (ref 30.0–36.0)
MCV: 91.3 fL (ref 80.0–100.0)
Monocytes Absolute: 0.5 10*3/uL (ref 0.1–1.0)
Monocytes Relative: 11 %
Neutro Abs: 3 10*3/uL (ref 1.7–7.7)
Neutrophils Relative %: 66 %
Platelets: 216 10*3/uL (ref 150–400)
RBC: 2.99 MIL/uL — ABNORMAL LOW (ref 4.22–5.81)
RDW: 14.8 % (ref 11.5–15.5)
WBC: 4.6 10*3/uL (ref 4.0–10.5)
nRBC: 0 % (ref 0.0–0.2)

## 2023-04-25 LAB — PSA: Prostatic Specific Antigen: 670.7 ng/mL — ABNORMAL HIGH (ref 0.00–4.00)

## 2023-04-25 MED ORDER — SODIUM CHLORIDE 0.9% FLUSH
10.0000 mL | Freq: Once | INTRAVENOUS | Status: AC
  Administered 2023-04-25: 10 mL via INTRAVENOUS

## 2023-04-25 MED ORDER — HEPARIN SOD (PORK) LOCK FLUSH 100 UNIT/ML IV SOLN
500.0000 [IU] | Freq: Once | INTRAVENOUS | Status: AC
  Administered 2023-04-25: 500 [IU] via INTRAVENOUS

## 2023-04-25 NOTE — Progress Notes (Signed)
 Patients port flushed without difficulty.  Good blood return noted with no bruising or swelling noted at site.  Band aid applied.  VSS with discharge and left in satisfactory condition with no s/s of distress noted.

## 2023-04-25 NOTE — Patient Instructions (Signed)

## 2023-04-27 ENCOUNTER — Other Ambulatory Visit: Payer: Self-pay

## 2023-04-29 ENCOUNTER — Encounter: Payer: Self-pay | Admitting: Hematology

## 2023-04-30 ENCOUNTER — Telehealth: Payer: Self-pay

## 2023-04-30 ENCOUNTER — Other Ambulatory Visit (HOSPITAL_COMMUNITY): Payer: Self-pay

## 2023-04-30 ENCOUNTER — Encounter: Payer: Self-pay | Admitting: Hematology

## 2023-04-30 NOTE — Telephone Encounter (Signed)
Pharmacy Patient Advocate Encounter   Received notification from CoverMyMeds that prior authorization for NP Thyroid 120MG  tablets is required/requested.   Insurance verification completed.   The patient is insured through [employer] .   Per test claim:  Levothyroxine,  is preferred by the insurance.  If suggested medication is appropriate, Please send in a new RX and discontinue this one. If not, please advise as to why it's not appropriate so that we may request a Prior Authorization. Please note, some preferred medications may still require a PA.  If the suggested medications have not been trialed and there are no contraindications to their use, the PA will not be submitted, as it will not be approved.

## 2023-05-01 ENCOUNTER — Other Ambulatory Visit: Payer: Self-pay | Admitting: *Deleted

## 2023-05-01 ENCOUNTER — Encounter: Payer: Self-pay | Admitting: Hematology

## 2023-05-01 DIAGNOSIS — C61 Malignant neoplasm of prostate: Secondary | ICD-10-CM

## 2023-05-01 MED ORDER — OXYCODONE HCL 10 MG PO TABS: 10.0000 mg | ORAL_TABLET | Freq: Four times a day (QID) | ORAL | 0 refills | Status: DC | PRN

## 2023-05-02 ENCOUNTER — Ambulatory Visit (INDEPENDENT_AMBULATORY_CARE_PROVIDER_SITE_OTHER): Payer: Medicare Other | Admitting: Urology

## 2023-05-02 ENCOUNTER — Ambulatory Visit: Payer: MEDICARE | Admitting: Hematology

## 2023-05-02 ENCOUNTER — Other Ambulatory Visit (HOSPITAL_COMMUNITY): Payer: BC Managed Care – PPO

## 2023-05-02 VITALS — BP 144/84 | HR 90

## 2023-05-02 DIAGNOSIS — N99112 Postprocedural membranous urethral stricture: Secondary | ICD-10-CM | POA: Diagnosis not present

## 2023-05-02 DIAGNOSIS — R3912 Poor urinary stream: Secondary | ICD-10-CM

## 2023-05-02 DIAGNOSIS — C61 Malignant neoplasm of prostate: Secondary | ICD-10-CM | POA: Diagnosis not present

## 2023-05-02 MED ORDER — CIPROFLOXACIN HCL 500 MG PO TABS
500.0000 mg | ORAL_TABLET | Freq: Once | ORAL | Status: AC
  Administered 2023-05-02: 500 mg via ORAL

## 2023-05-02 NOTE — Progress Notes (Unsigned)
Prostate cancer metastatic to multiple sites [facility]) - Plan: ciprofloxacin (CIPRO) tablet 500 mg, Cystoscopy, PR COMPLEX UROFLOMETRY  Weak urinary stream  Postprocedural membranous urethral stricture  Subjective:  05/02/23: Will returns today for a flowrate and cystoscopy to assess his reduced stream.  His PSA was up to 670 on 04/25/23 from 392 on 03/14/23.  04/18/23: Will returns today in f/u for mCRCP.  I did a TURP on 12/18/22 for locally recurrent disease with dystrophic calcifications.  He remains on estradiol, dutasteride and lupron with last dose on 01/14/23.  He is now on Pluvicto and his PSA on 03/14/23 was 392 which was up from 345 on 01/29/23.  His IPSS is 19 with a progressively slower stream over the last week or so. He is due for additional imaging in March.  He has lost weight since his last visit.  He continues to have pain and is on Oxycodone 4x daily.  He has had no hematuria.  He has some dysuria.  He is anemic with a Hgb of 9.6.  UA has 11-30 WBC and 11-30 RBC with a few bacteria.    12/13/22: Will returns today in f/u.  He was in the ER 9 days ago with difficulty voiding.  With nocturia 30-40x.  He had no hematuria.  He had dysuria and ankle edema.  His UA was negative but he had some benefit from pyridium.  He is currently on Pluvicto in addition to his ADT with Leuprolide, dutasteride and estradiol patches.  He is on tramadol and hydrocodone.  His IPSS is 19 with urgency and nocturia.  He had a CT that shows probable regrowth at the bladder neck.  He has no SUI but occasional UUI.  His PSA has fallen from >1500 to 734 with 2 rounds of Pluvicto.   .   06/14/22: Will returns today in f/u.  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  He had radation to spinal mets with the most recent at L4  in 1/24.  His PSA was 53.59 in 2/24 and was 44.4 on 05/30/22. He remains on dutasteride and estradiol patches.  He has discussed pluvicto but hasn't initiated that.  He has had germline and  somatic testing with no actionable mutations per his report.   He has some increased LUTS but his IPSS is 6.  He has nocturia x 1-2 and occasional urgency with UUI.  He has had  12/07/21: Will returns today in f/u for leuprolide for his history of CRCP.  His PSA has continued to rise and was 3.58 in 6/23 and is now 9.84.  He last saw Dr. Ellin Saba on 11/01/21 and was switched to Darolutamide from abiaterone on 10/17/21.   He remains on dutasteride and estradiol patches as well. He had a PSMA PET on 09/28/21 and was noted to have tumor of the prostate gland and SV's and mets at L1 and L2.  There was no hydro noted.   He is on calcium and Vit D.  He had some increased voiding symptoms after starting the darolutamide but that has improved.  His IPSS is 7.  He has some low back and hip pain.  He is starting to losing weight after he gained on the prednisone.   He has discussed Pluvicto and is being considered for that.     06/01/21: Will returns today for Lupron.  His PSA is slowly rising and was 1.54 earlier this month.  He has fatigue and SOB. He has no further  bone pain.   He has a stable weight. He is voiding well with an IPSS of 5-6 with some nocturia 1-2x.   He has had no hematuria and the UA is clear.  He continues to use estradiol and abiaterone with pred.  His hot flashes are well controlled with the estradiol.   He is on no bone targeting agents and is reluctant to add them because of dental issues.  He has started a calcium supplement.   He saw Dr. Ellin Saba on 05/23/21.   12/01/20: Culver returns today in f/u for Lupron.  He has been dealing with shingles on the left flank and upper leg for the last 3-4 weeks but that is improving.  His last PSA in 8/22 was down to 0.89.  He started Zytiga in 4/22 and had lumbar SBRT in 5/22.   He has done well with that and his bone pain has improved.  He is voiding well with an IPSS of 2-3.  His Alk phos has normalized.    06/02/20: Conway returns in f/u for his history  of CRCP.  His PSA has been rising despite 6 rounds of chemo.   He is seeing Dr. Karilyn Cota and is on oral estradiol 1mg  po bid but the hot flashes have returned.   He remains on dutasteride and Lupron and his T is  <3 and PSA is up to 12.9 on 05/26/20 from 4.77 on 04/28/20.  He is voiding well and his Cr was 0.8 on 2/10.  He has back pain that improved with chemo and prednisone but it has recurred.   His bone scan on 01/14/20 demonstrated stable L2 mets.  The CT showed improved left hydro with minimal residual bilateral hydro.  He is due for Lupron.  He is voiding well.  His IPSS is 6.  He has a tooth that needs extraction.     12/04/19: Jobanny returns today in f/u from a TURP on 11/10/19 with resection of both UO's for malignant obstruction.   He is voiding better but has some frequency and nocturia 2-4x.  He has some left flank pain with voiding and probably has reflux.   His IPSS is 14.   He has had some hematuria but is improving.   He has some bone pain with the fulphila given for bone marrow support.   He is otherwise tolerating chemo after one dose.  His Cr is down to 0.9 from 1.1 prior to the resection.   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 CT with bilateral non-bulky iliac adenopathy and tiny uptake Lt orbit (no spine mets) by bone scan.  His PSA is up to 15.5 from  9.47 at last check and a doubling over the last year. The testosterone remains castrate at <3 on Lupron and dutasteride. He remains on estradiol 0.1mg  patches for the hot flashes.   He had a telehealth visit with Dr. Kathrynn Running for consideration of radiation therapy to the primary and pelvic nodes from the Axumin PET findings on 12/11/18 but decided against that.  He is seeing Dr. Ellin Saba as well but is  reluctant to consider Zytiga because of the prednisone.   He has had no recent hematuria.   He has occasional frequency with urgency and UUI.  He wears a pad. He has a reduced stream.  He has  nocturia 3-4x but he is drinking a lot of fluids.   His IPSS is 29-30.   Present Management:  01/2017 begin Lupron androgen deprivation Q28mo (declined reccomended orchiectomy)  04/2017 - PSA 2.86;  07/2017 PSA/T 3.44 / T 3 ==> Lupron 45mg .  10/08/17 PSA 2.1.  02/07/18 PSA 2.19/T 4. added Estradiol 0.1mg  patches twice weekly for the hot flashes.  03/10/18 PSA 1.87/T<3.  05/09/18 PSA 2.09/T10/ Est 32.  07/24/18 PSA 6/T 54  08/08/18 Lupron 45mg .  09/08/18 PSA 4.37  10/22/18 PSA 4.64/T<3.  11/26/18 Bone scan negative/ CT C/A/P negative. 12/11/18 Axumin PET small equivocal periaortic and left ext iliac nodes and right prostate activity.  I have discussed that with him.  11/28/18 PSA 6.46. 02/10/19 PSA 7.31/T <3. 03/19/19 PSA 8.20 04/11/19  Lupron 45mg  given.  04/23/19 PSA 9.96. 06/23/19 PSA 9.47. 09/28/19 PSA 15.56, T <3.  10/09/19 Lupron 45mg  given 11/10/19  Operative cysto / TURP 2018 with inviltrative bladder neck / prostate mass ( left UO purposefully resected).  11/16/19 PSA 20.69.   11/06/19 Mr.Wilner has a mass at the base of the bladder consistent with local invasion from his CRCP and he has left ureteral obstruction with a minor increase in his Cr.  He has seen Dr. Ellin Saba and is scheduled to begin chemotherapy but it was felt that decompression of the left kidney was indicated.   He is have increased voiding difficulty with passage of clots as well.    Results for DAREN, YEAGLE (MRN [id number]) as of 07/10/2019 08:56  Ref. Range 11/28/2018 10:23 02/10/2019 13:25 03/19/2019 13:49 04/23/2019 13:58 06/23/2019 13:41  Prostatic Specific Antigen Latest Ref Range: 0.00 - 4.00 ng/mL 6.46 (H) 7.31 (H) 8.20 (H) 9.96 (H) 9.47 (H)         ROS:  ROS:  A complete review of systems was performed.  All systems are negative except for pertinent findings as noted.   Review of Systems  Constitutional:  Negative for  weight loss.  Respiratory:  Positive for shortness of breath.   Musculoskeletal:        Bone pain in pelvis, sacrum and both hips.   Neurological:  Positive for weakness.    Allergies  Allergen Reactions   Crestor [Rosuvastatin] Other (See Comments)    myalgia   Ivp Dye [Iodinated Contrast Media] Swelling    Swelling on head, took Benadryl and swelling reduced   Wellbutrin [Bupropion] Hives    Outpatient Encounter Medications as of 05/02/2023  Medication Sig Note   Cholecalciferol 125 MCG (5000 UT) capsule Take 10,000 Units by mouth daily.    COMPRO 25 MG suppository Place 25 mg rectally every 8 (eight) hours as needed for nausea or vomiting.    dexamethasone (DECADRON) 4 MG tablet Take 1 tablet (4 mg total) by mouth daily. Take for three days after each Pluvicto treatment    docusate sodium (COLACE) 100 MG capsule Take 100 mg by mouth daily.    dronabinol (MARINOL) 5 MG capsule Take 1 capsule (5 mg total) by mouth 2 (two) times daily before lunch and supper.    dutasteride (AVODART) 0.5 MG capsule TAKE 1 CAPSULE(0.5 MG) BY MOUTH DAILY    Elastic Bandages & Supports (B-4 MED COMPRESSION HOSE MENS) MISC Wear as needed when awake to help prevent leg swelling    estradiol (VIVELLE-DOT) 0.1 MG/24HR patch APPLY 1 PATCH(0.1 MG) TOPICALLY TO THE SKIN 2 TIMES A WEEK 12/18/2022: On L hip   MAGNESIUM PO Take 1 tablet by mouth at bedtime.  megestrol (MEGACE) 40 MG/ML suspension Take 10 mLs (400 mg total) by mouth 2 (two) times daily.    MELATONIN PO Take 40 mg by mouth at bedtime.    Multiple Vitamin (MULTIVITAMIN WITH MINERALS) TABS tablet Take 1 tablet by mouth daily.    NATTOKINASE PO Take 2 capsules by mouth daily.    NP THYROID 120 MG tablet Take 0.5 tablets (60 mg total) by mouth every morning.    ondansetron (ZOFRAN ODT) 8 MG disintegrating tablet Take 1 tablet (8 mg total) by mouth every 8 (eight) hours as needed for nausea or vomiting.    Oxycodone HCl 10 MG TABS Take 1 tablet (10 mg  total) by mouth every 6 (six) hours as needed.    phenazopyridine (PYRIDIUM) 200 MG tablet Take 1 tablet (200 mg total) by mouth 3 (three) times daily.    prochlorperazine (COMPAZINE) 10 MG tablet Take 1 tablet (10 mg total) by mouth every 6 (six) hours as needed for nausea or vomiting.    pyridOXINE (VITAMIN B6) 25 MG tablet Take 25 mg by mouth daily.    thiamine (VITAMIN B-1) 50 MG tablet Take 1 tablet (50 mg total) by mouth daily.    traMADol (ULTRAM) 50 MG tablet Take 2 tablets (100 mg total) by mouth every 6 (six) hours as needed.    vitamin E 180 MG (400 UNITS) capsule Take 400 Units by mouth daily.    VITAMIN K PO Take 1 capsule by mouth daily.    [EXPIRED] ciprofloxacin (CIPRO) tablet 500 mg     No facility-administered encounter medications on file as of 05/02/2023.    Past Medical History:  Diagnosis Date   Anemia    Bladder obstruction    Complication of anesthesia    Diverticulitis 2015   Dyspnea    Essential hypertension, benign 01/26/2019   at doctor's office elevated, normal at home, no medications at this time   GERD (gastroesophageal reflux disease)    History of basal cell cancer    HLD (hyperlipidemia) 01/26/2019   Hypothyroidism    history of, not currently taking medication   PONV (postoperative nausea and vomiting)    Prostate cancer (HCC)    metastatic to bones   Vitamin D deficiency disease 01/26/2019    Past Surgical History:  Procedure Laterality Date   CYSTOSCOPY W/ URETERAL STENT PLACEMENT Left 11/10/2019   Procedure: CYSTOSCOPY;  Surgeon: Bjorn Pippin, MD;  Location: [facility];  Service: Urology;  Laterality: Left;   ESOPHAGOGASTRODUODENOSCOPY (EGD) WITH PROPOFOL N/A 07/07/2020   Procedure: ESOPHAGOGASTRODUODENOSCOPY (EGD) WITH PROPOFOL;  Surgeon: Corbin Ade, MD;  Location: [facility];  Service: Endoscopy;  Laterality: N/A;  pm appt   HERNIA REPAIR  1995   UMBILICAL    IR FLUORO GUIDED NEEDLE PLC ASPIRATION/INJECTION LOC  04/09/2022   IR IMAGING  GUIDED PORT INSERTION  10/11/2022   MALONEY DILATION N/A 07/07/2020   Procedure: Elease Hashimoto DILATION;  Surgeon: Corbin Ade, MD;  Location: [facility];  Service: Endoscopy;  Laterality: N/A;   PROSTATE SURGERY     SKIN CANCER EXCISION     TONSILLECTOMY Bilateral    TOOTH EXTRACTION     front left tooth pulled but no surgery or stitches   TRANSURETHRAL RESECTION OF BLADDER TUMOR N/A 12/18/2022   Procedure: REMOVAL OF BLADDER STONES;  Surgeon: Bjorn Pippin, MD;  Location: [facility];  Service: Urology;  Laterality: N/A;  1 HR FOR CASE   TRANSURETHRAL RESECTION OF PROSTATE N/A 01/25/2017   Procedure:  TRANSURETHRAL RESECTION OF THE PROSTATE (TURP);  Surgeon: Sebastian Ache, MD;  Location: [facility];  Service: Urology;  Laterality: N/A;   TRANSURETHRAL RESECTION OF PROSTATE N/A 11/10/2019   Procedure: TRANSURETHRAL RESECTION OF THE PROSTATE (TURP);  Surgeon: Bjorn Pippin, MD;  Location: [facility];  Service: Urology;  Laterality: N/A;   TRANSURETHRAL RESECTION OF PROSTATE N/A 12/18/2022   Procedure: TRANSURETHRAL RESECTION OF THE PROSTATE (TURP);  Surgeon: Bjorn Pippin, MD;  Location: [facility];  Service: Urology;  Laterality: N/A;   WISDOM TOOTH EXTRACTION      Social History   Socioeconomic History   Marital status: Married    Spouse name: Not on file   Number of children: 1   Years of education: Not on file   Highest education level: Bachelor's degree (e.g., BA, AB, BS)  Occupational History    Comment: working full time  Tobacco Use   Smoking status: Former    Current packs/day: 0.00    Average packs/day: 1.5 packs/day for 35.0 years (52.5 ttl pk-yrs)    Types: Cigarettes    Start date: 01/11/1969    Quit date: 01/12/2004    Years since quitting: 19.3   Smokeless tobacco: Never   Tobacco comments:    OVER 30 YEARS HX OF SMOKING   Vaping Use   Vaping status: Never Used  Substance and Sexual Activity   Alcohol use: Not Currently    Comment: heavy drinker until 2004   Drug use: Not Currently     Types: Marijuana   Sexual activity: Not Currently  Other Topics Concern   Not on file  Social History Narrative   Married for 19 years.Works for [employer] from home.   Social Drivers of Corporate investment banker Strain: Low Risk  (08/24/2022)   Overall Financial Resource Strain (CARDIA)    Difficulty of Paying Living Expenses: Not very hard  Food Insecurity: No Food Insecurity (12/18/2022)   Hunger Vital Sign    Worried About Running Out of Food in the Last Year: Never true    Ran Out of Food in the Last Year: Never true  Transportation Needs: No Transportation Needs (12/18/2022)   PRAPARE - Administrator, Civil Service (Medical): No    Lack of Transportation (Non-Medical): No  Physical Activity: Insufficiently Active (08/24/2022)   Exercise Vital Sign    Days of Exercise per Week: 5 days    Minutes of Exercise per Session: 20 min  Stress: Stress Concern Present (08/24/2022)   [employer] of Occupational Health - Occupational Stress Questionnaire    Feeling of Stress : To some extent  Social Connections: Socially Isolated (08/24/2022)   Social Connection and Isolation Panel [NHANES]    Frequency of Communication with Friends and Family: Once a week    Frequency of Social Gatherings with Friends and Family: Once a week    Attends Religious Services: Never    Database administrator or Organizations: No    Attends Engineer, structural: Not on file    Marital Status: Married  Catering manager Violence: Not At Risk (12/18/2022)   Humiliation, Afraid, Rape, and Kick questionnaire    Fear of Current or Ex-Partner: No    Emotionally Abused: No    Physically Abused: No    Sexually Abused: No    Family History  Problem Relation Age of Onset   Emphysema Mother    Alzheimer's disease Father    Prostate cancer Father  dx in his 79s   Prostate cancer Brother        dx in his 42s   Breast cancer Maternal Grandmother        60s   Pancreatic  cancer Maternal Grandfather        45s   Dementia Paternal Grandmother    Heart attack Paternal Grandfather    Colon cancer Neg Hx        Objective: Vitals:   05/02/23 1533  BP: (!) 144/84  Pulse: 90     Physical Exam Vitals reviewed.  Constitutional:      Appearance: He is ill-appearing.  Neurological:     Mental Status: He is alert.   I  Recent Results (from the past 2160 hours)  PSA     Status: Abnormal   Collection Time: 03/14/23 11:10 AM  Result Value Ref Range   Prostatic Specific Antigen 392.68 (H) 0.00 - 4.00 ng/mL    Comment: (NOTE) While PSA levels of <=4.00 ng/ml are reported as reference range, some men with levels below 4.00 ng/ml can have prostate cancer and many men with PSA above 4.00 ng/ml do not have prostate cancer.  Other tests such as free PSA, age specific reference ranges, PSA velocity and PSA doubling time may be helpful especially in men less than 9 years old. Performed at [facility], [address] [place], [place] [address]   Magnesium     Status: None   Collection Time: 03/14/23 11:10 AM  Result Value Ref Range   Magnesium 2.0 1.7 - 2.4 mg/dL    Comment: Performed at [facility], [address], [place], [place] [address]  Comprehensive metabolic panel     Status: Abnormal   Collection Time: 03/14/23 11:10 AM  Result Value Ref Range   Sodium 132 (L) 135 - 145 mmol/L   Potassium 3.6 3.5 - 5.1 mmol/L   Chloride 104 98 - 111 mmol/L   CO2 19 (L) 22 - 32 mmol/L   Glucose, Bld 95 70 - 99 mg/dL    Comment: Glucose reference range applies only to samples taken after fasting for at least 8 hours.   BUN 10 8 - 23 mg/dL   Creatinine, Ser 8.29 0.61 - 1.24 mg/dL   Calcium 8.6 (L) 8.9 - 10.3 mg/dL   Total Protein 5.9 (L) 6.5 - 8.1 g/dL   Albumin 3.2 (L) 3.5 - 5.0 g/dL   AST 18 15 - 41 U/L   ALT 13 0 - 44 U/L   Alkaline Phosphatase 304 (H) 38 - 126 U/L   Total Bilirubin 0.6 <1.2 mg/dL   GFR, Estimated >56 >21 mL/min    Comment:  (NOTE) Calculated using the CKD-EPI Creatinine Equation (2021)    Anion gap 9 5 - 15    Comment: Performed at [facility], [address], [place], [place] [address]  CBC with Differential     Status: Abnormal   Collection Time: 03/14/23 11:10 AM  Result Value Ref Range   WBC 4.8 4.0 - 10.5 K/uL   RBC 3.10 (L) 4.22 - 5.81 MIL/uL   Hemoglobin 9.6 (L) 13.0 - 17.0 g/dL   HCT 78.4 (L) 69.6 - 29.5 %   MCV 91.9 80.0 - 100.0 fL   MCH 31.0 26.0 - 34.0 pg   MCHC 33.7 30.0 - 36.0 g/dL   RDW 28.4 13.2 - 44.0 %   Platelets 264 150 - 400 K/uL   nRBC 0.0 0.0 - 0.2 %   Neutrophils Relative %  67 %   Neutro Abs 3.3 1.7 - 7.7 K/uL   Lymphocytes Relative 20 %   Lymphs Abs 1.0 0.7 - 4.0 K/uL   Monocytes Relative 10 %   Monocytes Absolute 0.5 0.1 - 1.0 K/uL   Eosinophils Relative 1 %   Eosinophils Absolute 0.0 0.0 - 0.5 K/uL   Basophils Relative 1 %   Basophils Absolute 0.0 0.0 - 0.1 K/uL   Immature Granulocytes 1 %   Abs Immature Granulocytes 0.03 0.00 - 0.07 K/uL    Comment: Performed at [facility], [address], [place], [place] [address]  Urinalysis, Routine w reflex microscopic     Status: Abnormal   Collection Time: 04/18/23  3:41 PM  Result Value Ref Range   Specific Gravity, UA 1.030 1.005 - 1.030   pH, UA 5.5 5.0 - 7.5   Color, UA Yellow Yellow   Appearance Ur Clear Clear   Leukocytes,UA Trace (A) Negative   Protein,UA 2+ (A) Negative/Trace   Glucose, UA Negative Negative   Ketones, UA Trace (A) Negative   RBC, UA 2+ (A) Negative   Bilirubin, UA Negative Negative   Urobilinogen, Ur 0.2 0.2 - 1.0 mg/dL   Nitrite, UA Negative Negative   Microscopic Examination See below:     Comment: Microscopic was indicated and was performed.  Microscopic Examination     Status: Abnormal   Collection Time: 04/18/23  3:41 PM   Urine  Result Value Ref Range   WBC, UA 11-30 (A) 0 - 5 /hpf   RBC, Urine 11-30 (A) 0 - 2 /hpf   Epithelial Cells (non renal) 0-10 0 - 10 /hpf   Bacteria, UA Few  (A) None seen/Few  Bladder scan     Status: None   Collection Time: 04/18/23  3:54 PM  Result Value Ref Range   Scan Result 3   Urine Culture     Status: None   Collection Time: 04/18/23  3:58 PM   Specimen: Urine, Clean Catch   UR  Result Value Ref Range   Urine Culture, Routine Final report    Organism ID, Bacteria No growth   PSA     Status: Abnormal   Collection Time: 04/25/23 11:03 AM  Result Value Ref Range   Prostatic Specific Antigen 670.70 (H) 0.00 - 4.00 ng/mL    Comment: RESULT CONFIRMED BY MANUAL DILUTION (NOTE) While PSA levels of <=4.00 ng/ml are reported as reference range, some men with levels below 4.00 ng/ml can have prostate cancer and many men with PSA above 4.00 ng/ml do not have prostate cancer.  Other tests such as free PSA, age specific reference ranges, PSA velocity and PSA doubling time may be helpful especially in men less than 51 years old. Performed at [facility], [address], [place], [place] [address]   Comprehensive metabolic panel     Status: Abnormal   Collection Time: 04/25/23 11:03 AM  Result Value Ref Range   Sodium 130 (L) 135 - 145 mmol/L   Potassium 3.7 3.5 - 5.1 mmol/L   Chloride 103 98 - 111 mmol/L   CO2 18 (L) 22 - 32 mmol/L   Glucose, Bld 109 (H) 70 - 99 mg/dL    Comment: Glucose reference range applies only to samples taken after fasting for at least 8 hours.   BUN 9 8 - 23 mg/dL   Creatinine, Ser 8.65 0.61 - 1.24 mg/dL   Calcium 8.6 (L) 8.9 - 10.3 mg/dL   Total Protein 5.9 (  L) 6.5 - 8.1 g/dL   Albumin 3.0 (L) 3.5 - 5.0 g/dL   AST 19 15 - 41 U/L   ALT 10 0 - 44 U/L   Alkaline Phosphatase 426 (H) 38 - 126 U/L   Total Bilirubin 0.6 0.0 - 1.2 mg/dL   GFR, Estimated >16 >10 mL/min    Comment: (NOTE) Calculated using the CKD-EPI Creatinine Equation (2021)    Anion gap 9 5 - 15    Comment: Performed at [facility], [address] [place], [place] [address]  CBC with Differential     Status: Abnormal    Collection Time: 04/25/23 11:03 AM  Result Value Ref Range   WBC 4.6 4.0 - 10.5 K/uL   RBC 2.99 (L) 4.22 - 5.81 MIL/uL   Hemoglobin 9.2 (L) 13.0 - 17.0 g/dL   HCT 40.9 (L) 81.1 - 91.4 %   MCV 91.3 80.0 - 100.0 fL   MCH 30.8 26.0 - 34.0 pg   MCHC 33.7 30.0 - 36.0 g/dL   RDW 78.2 95.6 - 21.3 %   Platelets 216 150 - 400 K/uL   nRBC 0.0 0.0 - 0.2 %   Neutrophils Relative % 66 %   Neutro Abs 3.0 1.7 - 7.7 K/uL   Lymphocytes Relative 21 %   Lymphs Abs 1.0 0.7 - 4.0 K/uL   Monocytes Relative 11 %   Monocytes Absolute 0.5 0.1 - 1.0 K/uL   Eosinophils Relative 0 %   Eosinophils Absolute 0.0 0.0 - 0.5 K/uL   Basophils Relative 1 %   Basophils Absolute 0.0 0.0 - 0.1 K/uL   Immature Granulocytes 1 %   Abs Immature Granulocytes 0.06 0.00 - 0.07 K/uL    Comment: Performed at [facility], [address] [place], [place] [address]   I reviewed the records from his visits with Dr. Ellin Saba and his CT report.  No results found. Procedure: Cystoscopy.  He had insufficient volume for a flowrate.   He was prepped with betadine and 2% lidocaine jelly.   Cipro 500mg  was given.  His urethra had studding with tumor implants mostly anteriorly in the mid and bulbar urethra.  There was some tumor at the external sphincter with a moderate membranous stricture that was about 10-12 fr but wouldn't allow the scope to pass.  I was unable to assess the bladder.     Assessment & Plan: Weak stream with urethral tumor implants and a membranous stricture with assocated tumor implants.  He has about a 10-52fr channel and is voiding but with a reduced stream.   I have discussed the options including observation, urethral dilation with possible tumor resection or placement of a suprapubic tube.   At this time, I will have him return in 4-6 weeks to reassess as his cancer seems to be progressing rapidly and I don't want to be aggressive unless absolutely necessary.  He has CRCP with widely metastatic and locally  advanced disease with recurrent LUTS and regrowth at the bladder neck.  He has urethral and membranous sphincter tumor implants now.   He is currently on Pluvicto but his PSA is rising rapidly.  He is scheduled for a PSMA PET scan on 05/09/23. Marland Kitchen   History of Left ureteral obstruction.   He has no left flank pain.  He will have f/u imaging on 2/20   Continue Luprolide 45mg  q62mo and dutasteride.     The estradiol is managed by Dr. Ellin Saba.    Meds ordered this encounter  Medications  ciprofloxacin (CIPRO) tablet 500 mg     Orders Placed This Encounter  Procedures   PR COMPLEX UROFLOMETRY   Cystoscopy       Return for 4-6 weeks with a PVR. Marland Kitchen   CC: Etta Grandchild, MD, Dr. Doreatha Massed.     Bjorn Pippin 05/03/2023 Patient ID: Greer Ee, male   DOB: 05-Jan-1956, 68 y.o.   MRN: [id number]

## 2023-05-02 NOTE — Telephone Encounter (Signed)
Are you going to change this medication ?

## 2023-05-03 ENCOUNTER — Inpatient Hospital Stay: Payer: Medicare Other | Admitting: Licensed Clinical Social Worker

## 2023-05-03 DIAGNOSIS — C61 Malignant neoplasm of prostate: Secondary | ICD-10-CM

## 2023-05-03 NOTE — Progress Notes (Signed)
[facility] CSW Progress Note  Visual merchandiser  spoke with pt and spouse over the phone to check in.  Pt and spouse requesting a letter from Dr. Ellin Saba to be included with the long term disability application he will be submitting, stating pt will not be medically able to return to work.   Request sent to medical team to contact spouse when letter is available for pick up.  CSW to continue to provide support as appropriate throughout duration of treatment.      Rachel Moulds, LCSW Clinical Social Worker [facility]

## 2023-05-04 ENCOUNTER — Other Ambulatory Visit: Payer: Self-pay

## 2023-05-06 ENCOUNTER — Encounter: Payer: Self-pay | Admitting: *Deleted

## 2023-05-06 NOTE — Telephone Encounter (Signed)
Called pt and attempted to get him to schedule an appointment. Pt states "I don't think I am able to make it down there for an appointment" I advised I will document his response.

## 2023-05-07 ENCOUNTER — Ambulatory Visit: Payer: MEDICARE | Admitting: Hematology

## 2023-05-07 ENCOUNTER — Other Ambulatory Visit: Payer: Self-pay

## 2023-05-07 ENCOUNTER — Other Ambulatory Visit (HOSPITAL_COMMUNITY): Payer: BC Managed Care – PPO

## 2023-05-08 ENCOUNTER — Encounter: Payer: Self-pay | Admitting: Hematology

## 2023-05-09 ENCOUNTER — Encounter (HOSPITAL_COMMUNITY)
Admission: RE | Admit: 2023-05-09 | Discharge: 2023-05-09 | Disposition: A | Discharge status: Home / Self Care | Payer: Medicare Other | Source: Ambulatory Visit | Attending: Hematology | Admitting: Hematology

## 2023-05-09 DIAGNOSIS — C61 Malignant neoplasm of prostate: Secondary | ICD-10-CM | POA: Insufficient documentation

## 2023-05-09 MED ORDER — FLOTUFOLASTAT F 18 GALLIUM 296-5846 MBQ/ML IV SOLN
8.0000 | Freq: Once | INTRAVENOUS | Status: AC
  Administered 2023-05-09: 7.69 via INTRAVENOUS
  Filled 2023-05-09: qty 8

## 2023-05-13 ENCOUNTER — Other Ambulatory Visit: Payer: Self-pay | Admitting: *Deleted

## 2023-05-13 ENCOUNTER — Inpatient Hospital Stay: Payer: Medicare Other

## 2023-05-13 ENCOUNTER — Inpatient Hospital Stay (HOSPITAL_BASED_OUTPATIENT_CLINIC_OR_DEPARTMENT_OTHER): Payer: Medicare Other | Admitting: Hematology

## 2023-05-13 VITALS — BP 129/80 | HR 88 | Temp 96.8°F | Resp 18 | Wt 139.3 lb

## 2023-05-13 DIAGNOSIS — C61 Malignant neoplasm of prostate: Secondary | ICD-10-CM

## 2023-05-13 LAB — CBC WITH DIFFERENTIAL/PLATELET
Abs Immature Granulocytes: 0.05 10*3/uL (ref 0.00–0.07)
Basophils Absolute: 0 10*3/uL (ref 0.0–0.1)
Basophils Relative: 1 %
Eosinophils Absolute: 0 10*3/uL (ref 0.0–0.5)
Eosinophils Relative: 0 %
HCT: 27.7 % — ABNORMAL LOW (ref 39.0–52.0)
Hemoglobin: 9.2 g/dL — ABNORMAL LOW (ref 13.0–17.0)
Immature Granulocytes: 1 %
Lymphocytes Relative: 12 %
Lymphs Abs: 0.7 10*3/uL (ref 0.7–4.0)
MCH: 30.4 pg (ref 26.0–34.0)
MCHC: 33.2 g/dL (ref 30.0–36.0)
MCV: 91.4 fL (ref 80.0–100.0)
Monocytes Absolute: 0.8 10*3/uL (ref 0.1–1.0)
Monocytes Relative: 13 %
Neutro Abs: 4.5 10*3/uL (ref 1.7–7.7)
Neutrophils Relative %: 73 %
Platelets: 248 10*3/uL (ref 150–400)
RBC: 3.03 MIL/uL — ABNORMAL LOW (ref 4.22–5.81)
RDW: 15.1 % (ref 11.5–15.5)
WBC: 6.1 10*3/uL (ref 4.0–10.5)
nRBC: 0 % (ref 0.0–0.2)

## 2023-05-13 LAB — COMPREHENSIVE METABOLIC PANEL
ALT: 13 U/L (ref 0–44)
AST: 25 U/L (ref 15–41)
Albumin: 3.1 g/dL — ABNORMAL LOW (ref 3.5–5.0)
Alkaline Phosphatase: 816 U/L — ABNORMAL HIGH (ref 38–126)
Anion gap: 11 (ref 5–15)
BUN: 11 mg/dL (ref 8–23)
CO2: 19 mmol/L — ABNORMAL LOW (ref 22–32)
Calcium: 8.7 mg/dL — ABNORMAL LOW (ref 8.9–10.3)
Chloride: 102 mmol/L (ref 98–111)
Creatinine, Ser: 0.66 mg/dL (ref 0.61–1.24)
GFR, Estimated: >60 mL/min (ref >60)
Glucose, Bld: 91 mg/dL (ref 70–99)
Potassium: 4 mmol/L (ref 3.5–5.1)
Sodium: 132 mmol/L — ABNORMAL LOW (ref 135–145)
Total Bilirubin: 0.9 mg/dL (ref 0.0–1.2)
Total Protein: 6.2 g/dL — ABNORMAL LOW (ref 6.5–8.1)

## 2023-05-13 LAB — SAMPLE TO BLOOD BANK

## 2023-05-13 LAB — MAGNESIUM: Magnesium: 2 mg/dL (ref 1.7–2.4)

## 2023-05-13 MED ORDER — HEPARIN SOD (PORK) LOCK FLUSH 100 UNIT/ML IV SOLN
500.0000 [IU] | Freq: Once | INTRAVENOUS | Status: AC
  Administered 2023-05-13: 500 [IU] via INTRAVENOUS

## 2023-05-13 MED ORDER — DEXAMETHASONE 4 MG PO TABS: 4.0000 mg | ORAL_TABLET | Freq: Every day | ORAL | 1 refills | Status: DC

## 2023-05-13 MED ORDER — SODIUM CHLORIDE 0.9% FLUSH
10.0000 mL | Freq: Once | INTRAVENOUS | Status: AC
  Administered 2023-05-13: 10 mL via INTRAVENOUS

## 2023-05-13 NOTE — Progress Notes (Unsigned)
 Scott [name] presented for Portacath access and flush. Proper placement of portacath confirmed by CXR. Portacath located right chest wall accessed with  H 20 needle. Good blood return present. Portacath flushed with 20ml NS and 500U/62ml Heparin and needle removed intact. Procedure without incident. Patient tolerated procedure well.

## 2023-05-13 NOTE — Progress Notes (Signed)
 [name] [address] [place] [address]    Clinic Day:  05/14/2023  Referring physician: Etta Grandchild, MD  Patient Care Team: Scott Grandchild, MD as PCP - General (Internal Medicine) Scott Massed, MD as Consulting Physician (Hematology and Oncology) Scott Gauss Gerrit Friends, MD as Consulting Physician (Gastroenterology) Scott Sarah, RN as Oncology Nurse Navigator (Medical Oncology)   ASSESSMENT & PLAN:   Assessment: 1.  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  Prominent left hydronephrosis and hydroureter noted.  New sclerotic lesion in the left anterior vertebral body of L1.  Mild left periaortic adenopathy increased from prior, short axis lymph node measuring 1 cm. -Last PSA increased to 15.8 on 09/28/2019. -As there is rapid progression of disease, I have recommended docetaxel chemotherapy.  He is agreeable after long discussion. -Cystoscopy with transurethral resection of tumor in the posterior bladder neck, right and left walls of the bladder, right lobe of the prostate on 11/10/2019. -Plan was to do 6 cycles of docetaxel followed by abiraterone and prednisone. -6 cycles of docetaxel from 11/16/2019 through 02/29/2020. -CTAP on 01/06/2020 showed persistent thickening of the urinary bladder wall.  Left hydronephrosis is significantly reduced compared to prior exam.  Left-sided retroperitoneal and left iliac lymph nodes reduced in size.  Interval increase in sclerosis of lesions of the left aspect of L2 vertebral body consistent with treatment response.  No new bone lesions. -Bone scan on 01/06/2020 shows stable L2 metastatic focus. -Bone scan scan images from 06/14/2020 which showed progressive metastatic disease, with increased size and intensity of L2 1 new focus of activity at  L1. 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 -Abiraterone and prednisone from 06/24/2020 through 09/06/2021 with progression. - PSMA PET scan (09/28/2021): Residual/recurrent tumor involving prostate gland and seminal vesicles.  Tracer avid bone metastasis localized L1 and L2 vertebral bodies.  No new sites of bone metastasis disease.  No solid organ or nodal mets. - Darolutamide from 10/17/2021 through 03/15/2022 with progression - Guardant360: MSI high not detected.  Androgen resistance present. - XRT to L2-L4, 30 Gray in 10 fractions completed on 04/23/2022 with improvement in pain. - Cabazitaxel 4 cycles from 05/07/2022 through 07/11/2022 with progression. - Pluvicto cycle 1 on 09/26/2022, cycle 2 on 11/08/2022, cycle 3 on 12/28/2022 - TURP on 12/18/2022 by Dr. Annabell [name]   2.  Left hydroureteronephrosis: -CT scan showed prostate mass invading the posterior bladder and obstructing the ureter.  Creatinine increased to 1.1. -Renal ultrasound on 12/11/2019 showed interval resolution of previously seen left-sided hydronephrosis.  Previously seen soft tissue mass at the posterior bladder lumen no longer seen.    Plan: 1.  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  However his PSA has doubled recently. - I reviewed PSMA PET scan from 05/09/2023: Marked progression of bone metastasis with involvement of axillary skeleton and involvement in the appendicular skeleton.  Some improvement in local recurrence within the prostate gland.  Similar mild mediastinal and left hilar nodal metastasis.  No pulmonary or liver metastasis. - We will discontinue Pluvicto at this time. - Reviewed labs today: Calcium is 8.7 with almond 3.1.  Creatinine normal.  Alk phos elevated at 816.  CBC shows hemoglobin 9.2. - We discussed various options including Xofigo. - I have reached  out to [facility] Dr. Kathlene [name].  Unfortunately no clinical trials available. - I will  also reach out to Dr. Hilda [name] at [facility].   2.  Hot flashes: - Continue estradiol twice daily for hot flashes.   3.  Nausea/vomiting: - Continue Zofran alternating with Compazine as needed.   4.  Lower back pain/bilateral hip pains: - He is taking oxycodone 10 mg every 6 hours which is not helping.  Pain is worse in some areas. - Will start him on fentanyl 25 mcg patch. - Will start him on dexamethasone 4 mg in the mornings for bone pains. - I have also suggested that he take oxycodone 15 mg every 6 hours for breakthrough pain and may increase it to 20 mg as needed.  5.  Appetite: - Continue Megace twice daily.  Orders Placed This Encounter  Procedures   CBC with Differential    Standing Status:   Future    Number of Occurrences:   1    Expected Date:   05/13/2023    Expiration Date:   05/12/2024   Comprehensive metabolic panel    Standing Status:   Future    Number of Occurrences:   1    Expected Date:   05/13/2023    Expiration Date:   05/12/2024   Magnesium    Standing Status:   Future    Number of Occurrences:   1    Expected Date:   05/13/2023    Expiration Date:   05/12/2024   Sample to Blood Bank(Blood Bank Hold)    Standing Status:   Future    Number of Occurrences:   1    Expected Date:   05/13/2023    Expiration Date:   05/12/2024      Scott [name],acting as a scribe for Scott Massed, MD.,have documented all relevant documentation on the behalf of Scott Massed, MD,as directed by  Scott Massed, MD while in the presence of Scott Massed, MD.  I, Scott Massed MD, have reviewed the above documentation for accuracy and completeness, and I agree with the above.     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    Prior Therapy: 1. TURP on  11/10/2019. 2. Docetaxel x 6 cycles, 11/16/2019 - 02/29/2020. 3.  Abiraterone and prednisone, 06/24/20 - 09/06/21 4.  Darolutamide, 10/17/21 - 03/15/22  Current Therapy:  Cabazitaxel every 21 days    HISTORY OF PRESENT ILLNESS:   Oncology History  Prostate cancer metastatic to multiple sites [facility])  01/25/2017 Initial Diagnosis   Prostate cancer metastatic to multiple sites [facility])   11/16/2019 - 03/02/2020 Chemotherapy   Patient is on Treatment Plan : PROSTATE Docetaxel + Prednisone q21d      Genetic Testing   Negative genetic testing. No pathogenic variants identified on the [facility] CancerNext-Expanded panel. 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 The report date is 04/19/2020.  The CancerNext-Expanded  gene panel offered by [facility] and includes sequencing and rearrangement analysis for the following 77 genes: IP, ALK, APC*, ATM*, AXIN2, BAP1, BARD1, BLM, BMPR1A, BRCA1*, BRCA2*, BRIP1*, CDC73, CDH1*,CDK4, CDKN1B, CDKN2A, CHEK2*, CTNNA1, DICER1, FANCC, FH, FLCN, GALNT12, KIF1B, LZTR1, MAX, MEN1, MET, MLH1*, MSH2*, MSH3, MSH6*, MUTYH*, NBN, NF1*, NF2, NTHL1, PALB2*, PHOX2B, PMS2*, POT1, PRKAR1A, PTCH1, PTEN*, RAD51C*, RAD51D*,RB1, RECQL, RET, SDHA, SDHAF2, SDHB, SDHC, SDHD, SMAD4, SMARCA4, SMARCB1,  SMARCE1, STK11, SUFU, TMEM127, TP53*,TSC1, TSC2, VHL and XRCC2 (sequencing and deletion/duplication); EGFR, EGLN1, HOXB13, KIT, MITF, PDGFRA, POLD1 and POLE (sequencing only); EPCAM and GREM1 (deletion/duplication only).    05/07/2022 -  Chemotherapy   Patient is on Treatment Plan : PROSTATE Cabazitaxel (20) D1 + Prednisone D1-21 q21d        INTERVAL HISTORY:   Scott [name] is a 68 y.o. 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 He was last seen by me on 03/19/23.  Sine his last visit, he underwent PSMA scan on 05/09/23 that found: Marked progression of skeletal metastasis with increased involvement in the axillary skeleton and new  involvement in the appendicular skeleton. Nearly every bone is involved with intense radiotracer activity. Some improvement in local recurrence within the prostate gland. Similar mild mediastinal and LEFT hilar nodal metastasis. No pulmonary metastasis or liver metastasis.  Today, he states that he is doing well overall. His appetite level is at 50%. His energy level is at 40%.   PAST MEDICAL HISTORY:   Past Medical History: Past Medical History:  Diagnosis Date   Anemia    Bladder obstruction    Complication of anesthesia    Diverticulitis 2015   Dyspnea    Essential hypertension, benign 01/26/2019   at doctor's office elevated, normal at home, no medications at this time   GERD (gastroesophageal reflux disease)    History of basal cell cancer    HLD (hyperlipidemia) 01/26/2019   Hypothyroidism    history of, not currently taking medication   PONV (postoperative nausea and vomiting)    Prostate cancer (HCC)    metastatic to bones   Vitamin D deficiency disease 01/26/2019    Surgical History: Past Surgical History:  Procedure Laterality Date   CYSTOSCOPY W/ URETERAL STENT PLACEMENT Left 11/10/2019   Procedure: CYSTOSCOPY;  Surgeon: Bjorn Pippin, MD;  Location: [facility];  Service: Urology;  Laterality: Left;   ESOPHAGOGASTRODUODENOSCOPY (EGD) WITH PROPOFOL N/A 07/07/2020   Procedure: ESOPHAGOGASTRODUODENOSCOPY (EGD) WITH PROPOFOL;  Surgeon: Corbin Ade, MD;  [facility]: [facility];  Service: Endoscopy;  Laterality: N/A;  pm appt   HERNIA REPAIR  1995   UMBILICAL    IR FLUORO GUIDED NEEDLE PLC ASPIRATION/INJECTION LOC  04/09/2022   IR IMAGING GUIDED PORT INSERTION  10/11/2022   MALONEY DILATION N/A 07/07/2020   Procedure: Elease Hashimoto DILATION;  Surgeon: Corbin Ade, MD;  [facility]: [facility];  Service: Endoscopy;  Laterality: N/A;   PROSTATE SURGERY     SKIN CANCER EXCISION     TONSILLECTOMY Bilateral    TOOTH EXTRACTION     front left tooth pulled but no surgery or stitches    TRANSURETHRAL RESECTION OF BLADDER TUMOR N/A 12/18/2022   Procedure: REMOVAL OF BLADDER STONES;  Surgeon: Bjorn Pippin, MD;  Location: [facility];  Service: Urology;  Laterality: N/A;  1 HR FOR CASE   TRANSURETHRAL RESECTION OF PROSTATE N/A 01/25/2017   Procedure: TRANSURETHRAL RESECTION OF THE PROSTATE (TURP);  Surgeon: Sebastian Ache, MD;  Location: [facility];  Service: Urology;  Laterality: N/A;   TRANSURETHRAL RESECTION OF PROSTATE N/A 11/10/2019   Procedure: TRANSURETHRAL RESECTION OF THE PROSTATE (TURP);  Surgeon: Bjorn Pippin, MD;  Location: [facility];  Service: Urology;  Laterality: N/A;   TRANSURETHRAL RESECTION OF PROSTATE N/A 12/18/2022   Procedure: TRANSURETHRAL RESECTION OF THE PROSTATE (TURP);  Surgeon: Bjorn Pippin, MD;  Location: [facility];  Service: Urology;  Laterality: N/A;   WISDOM TOOTH  EXTRACTION      Social History: Social History   Socioeconomic History   Marital status: Married    Spouse name: Not on file   Number of children: 1   Years of education: Not on file   Highest education level: Bachelor's degree (e.g., BA, AB, BS)  Occupational History    Comment: working full time  Tobacco Use   Smoking status: Former    Current packs/day: 0.00    Average packs/day: 1.5 packs/day for 35.0 years (52.5 ttl pk-yrs)    Types: Cigarettes    Start date: 01/11/1969    Quit date: 01/12/2004    Years since quitting: 19.3   Smokeless tobacco: Never   Tobacco comments:    OVER 30 YEARS HX OF SMOKING   Vaping Use   Vaping status: Never Used  Substance and Sexual Activity   Alcohol use: Not Currently    Comment: heavy drinker until 2004   Drug use: Not Currently    Types: Marijuana   Sexual activity: Not Currently  Other Topics Concern   Not on file  Social History Narrative   Married for 19 years.Works for [employer] from home.   Social Drivers of Corporate investment banker Strain: Low Risk  (08/24/2022)   Overall Financial Resource Strain (CARDIA)    Difficulty of  Paying Living Expenses: Not very hard  Food Insecurity: No Food Insecurity (12/18/2022)   Hunger Vital Sign    Worried About Running Out of Food in the Last Year: Never true    Ran Out of Food in the Last Year: Never true  Transportation Needs: No Transportation Needs (12/18/2022)   PRAPARE - Administrator, Civil Service (Medical): No    Lack of Transportation (Non-Medical): No  Physical Activity: Insufficiently Active (08/24/2022)   Exercise Vital Sign    Days of Exercise per Week: 5 days    Minutes of Exercise per Session: 20 min  Stress: Stress Concern Present (08/24/2022)   [employer] of Occupational Health - Occupational Stress Questionnaire    Feeling of Stress : To some extent  Social Connections: Socially Isolated (08/24/2022)   Social Connection and Isolation Panel [NHANES]    Frequency of Communication with [name] and Family: Once a week    Frequency of Social Gatherings with [name] and Family: Once a week    Attends Religious Services: Never    Database administrator or Organizations: No    Attends Engineer, structural: Not on file    Marital Status: Married  Catering manager Violence: Not At Risk (12/18/2022)   Humiliation, Afraid, Rape, and Kick questionnaire    Fear of Current or Ex-Partner: No    Emotionally Abused: No    Physically Abused: No    Sexually Abused: No    Family History: Family History  Problem Relation Age of Onset   Emphysema Mother    Alzheimer's disease Father    Prostate cancer Father        dx in his 43s   Prostate cancer Brother        dx in his 76s   Breast cancer Maternal Grandmother        60s   Pancreatic cancer Maternal Grandfather        69s   Dementia Paternal Grandmother    Heart attack Paternal Grandfather    Colon cancer Neg Hx     Current Medications:  Current Outpatient Medications:    Cholecalciferol 125 MCG (5000 UT)  capsule, Take 10,000 Units by mouth daily., Disp: , Rfl:    COMPRO 25 MG  suppository, Place 25 mg rectally every 8 (eight) hours as needed for nausea or vomiting., Disp: , Rfl:    dexamethasone (DECADRON) 4 MG tablet, Take 1 tablet (4 mg total) by mouth daily., Disp: 30 tablet, Rfl: 1   docusate sodium (COLACE) 100 MG capsule, Take 100 mg by mouth daily., Disp: , Rfl:    dronabinol (MARINOL) 5 MG capsule, Take 1 capsule (5 mg total) by mouth 2 (two) times daily before lunch and supper., Disp: 60 capsule, Rfl: 1   dutasteride (AVODART) 0.5 MG capsule, TAKE 1 CAPSULE(0.5 MG) BY MOUTH DAILY, Disp: 90 capsule, Rfl: 3   Elastic Bandages & Supports (B-4 MED COMPRESSION HOSE MENS) MISC, Wear as needed when awake to help prevent leg swelling, Disp: 2 each, Rfl: 0   estradiol (VIVELLE-DOT) 0.1 MG/24HR patch, APPLY 1 PATCH(0.1 MG) TOPICALLY TO THE SKIN 2 TIMES A WEEK, Disp: 8 patch, Rfl: 12   MAGNESIUM PO, Take 1 tablet by mouth at bedtime., Disp: , Rfl:    megestrol (MEGACE) 40 MG/ML suspension, Take 10 mLs (400 mg total) by mouth 2 (two) times daily., Disp: 480 mL, Rfl: 2   MELATONIN PO, Take 40 mg by mouth at bedtime., Disp: , Rfl:    Multiple Vitamin (MULTIVITAMIN WITH MINERALS) TABS tablet, Take 1 tablet by mouth daily., Disp: , Rfl:    NATTOKINASE PO, Take 2 capsules by mouth daily., Disp: , Rfl:    NP THYROID 120 MG tablet, Take 0.5 tablets (60 mg total) by mouth every morning., Disp: 45 tablet, Rfl: 1   ondansetron (ZOFRAN ODT) 8 MG disintegrating tablet, Take 1 tablet (8 mg total) by mouth every 8 (eight) hours as needed for nausea or vomiting., Disp: 60 tablet, Rfl: 3   Oxycodone HCl 10 MG TABS, Take 1 tablet (10 mg total) by mouth every 6 (six) hours as needed., Disp: 120 tablet, Rfl: 0   phenazopyridine (PYRIDIUM) 200 MG tablet, Take 1 tablet (200 mg total) by mouth 3 (three) times daily., Disp: 45 tablet, Rfl: 0   prochlorperazine (COMPAZINE) 10 MG tablet, Take 1 tablet (10 mg total) by mouth every 6 (six) hours as needed for nausea or vomiting., Disp: 60 tablet, Rfl:  3   pyridOXINE (VITAMIN B6) 25 MG tablet, Take 25 mg by mouth daily., Disp: , Rfl:    thiamine (VITAMIN B-1) 50 MG tablet, Take 1 tablet (50 mg total) by mouth daily., Disp: 90 tablet, Rfl: 1   traMADol (ULTRAM) 50 MG tablet, Take 2 tablets (100 mg total) by mouth every 6 (six) hours as needed., Disp: 120 tablet, Rfl: 0   vitamin E 180 MG (400 UNITS) capsule, Take 400 Units by mouth daily., Disp: , Rfl:    VITAMIN K PO, Take 1 capsule by mouth daily., Disp: , Rfl:    fentaNYL (DURAGESIC) 25 MCG/HR, Place 1 patch onto the skin every 3 (three) days., Disp: 10 patch, Rfl: 0   Allergies: Allergies  Allergen Reactions   Crestor [Rosuvastatin] Other (See Comments)    myalgia   Ivp Dye [Iodinated Contrast Media] Swelling    Swelling on head, took Benadryl and swelling reduced   Wellbutrin [Bupropion] Hives    REVIEW OF SYSTEMS:   Review of Systems  Constitutional:  Negative for chills, fatigue and fever.  HENT:   Negative for lump/mass, mouth sores, nosebleeds, sore throat and trouble swallowing.   Eyes:  Negative for eye  problems.  Respiratory:  Positive for shortness of breath. Negative for cough.   Cardiovascular:  Positive for palpitations. Negative for chest pain and leg swelling.  Gastrointestinal:  Positive for constipation, nausea and vomiting. Negative for abdominal pain and diarrhea.  Genitourinary:  Positive for dysuria. Negative for bladder incontinence, difficulty urinating, frequency, hematuria and nocturia.   Musculoskeletal:  Negative for arthralgias, back pain, flank pain, myalgias and neck pain.  Skin:  Negative for itching and rash.  Neurological:  Negative for dizziness, headaches and numbness.  Hematological:  Does not bruise/bleed easily.  Psychiatric/Behavioral:  Positive for sleep disturbance. Negative for depression and suicidal ideas. The patient is not nervous/anxious.   All other systems reviewed and are negative.    VITALS:   Blood pressure 129/80, pulse  88, temperature (!) 96.8 F (36 C), temperature source Tympanic, resp. rate 18, weight 139 lb 5.3 oz (63.2 kg), SpO2 99%.  Wt Readings from Last 3 Encounters:  05/13/23 139 lb 5.3 oz (63.2 kg)  03/19/23 147 lb 11.2 oz (67 kg)  01/30/23 153 lb 1.6 oz (69.4 kg)    Body mass index is 22.49 kg/m.  Performance status (ECOG): 1 - Symptomatic but completely ambulatory  PHYSICAL EXAM:   Physical Exam Vitals and nursing note reviewed. Exam conducted with a chaperone present.  Constitutional:      Appearance: Normal appearance.  Cardiovascular:     Rate and Rhythm: Normal rate and regular rhythm.     Pulses: Normal pulses.     Heart sounds: Normal heart sounds.  Pulmonary:     Effort: Pulmonary effort is normal.     Breath sounds: Normal breath sounds.  Abdominal:     Palpations: Abdomen is soft. There is no hepatomegaly, splenomegaly or mass.     Tenderness: There is no abdominal tenderness.  Musculoskeletal:     Right lower leg: No edema.     Left lower leg: No edema.  Lymphadenopathy:     Cervical: No cervical adenopathy.     Right cervical: No superficial, deep or posterior cervical adenopathy.    Left cervical: No superficial, deep or posterior cervical adenopathy.     Upper Body:     Right upper body: No supraclavicular or axillary adenopathy.     Left upper body: No supraclavicular or axillary adenopathy.  Neurological:     General: No focal deficit present.     Mental Status: He is alert and oriented to person, place, and time.  Psychiatric:        Mood and Affect: Mood normal.        Behavior: Behavior normal.     LABS:      Latest Ref Rng & Units 05/13/2023    2:57 PM 04/25/2023   11:03 AM 03/14/2023   11:10 AM  CBC  WBC 4.0 - 10.5 K/uL 6.1  4.6  4.8   Hemoglobin 13.0 - 17.0 g/dL 9.2  9.2  9.6   Hematocrit 39.0 - 52.0 % 27.7  27.3  28.5   Platelets 150 - 400 K/uL 248  216  264       Latest Ref Rng & Units 05/13/2023    2:57 PM 04/25/2023   11:03 AM 03/14/2023    11:10 AM  CMP  Glucose 70 - 99 mg/dL 91  244  95   BUN 8 - 23 mg/dL 11  9  10    Creatinine 0.61 - 1.24 mg/dL 0.10  2.72  5.36   Sodium 135 - 145 mmol/L 132  130  132   Potassium 3.5 - 5.1 mmol/L 4.0  3.7  3.6   Chloride 98 - 111 mmol/L 102  103  104   CO2 22 - 32 mmol/L 19  18  19    Calcium 8.9 - 10.3 mg/dL 8.7  8.6  8.6   Total Protein 6.5 - 8.1 g/dL 6.2  5.9  5.9   Total Bilirubin 0.0 - 1.2 mg/dL 0.9  0.6  0.6   Alkaline Phos 38 - 126 U/L 816  426  304   AST 15 - 41 U/L 25  19  18    ALT 0 - 44 U/L 13  10  13       No results found for: "CEA1", "CEA" / No results found for: "CEA1", "CEA" Lab Results  Component Value Date   PSA1 12.9 (H) 05/26/2020   No results found for: "WUJ811" No results found for: "CAN125"  No results found for: "TOTALPROTELP", "ALBUMINELP", "A1GS", "A2GS", "BETS", "BETA2SER", "GAMS", "MSPIKE", "SPEI" Lab Results  Component Value Date   TIBC 336.0 01/09/2021   TIBC 280 04/20/2020   FERRITIN 69.8 01/09/2021   FERRITIN 45 04/20/2020   IRONPCTSAT 45.2 01/09/2021   IRONPCTSAT 31 04/20/2020   No results found for: "LDH"   STUDIES:   NM PET (PSMA) SKULL TO MID THIGH Result Date: 05/09/2023 CLINICAL DATA:  Prostate carcinoma with biochemical recurrence. Patient undergoing PTC 177 PSMA therapy. Rising PSA. Concern for progression on therapy. EXAM: NUCLEAR MEDICINE PET SKULL BASE TO THIGH TECHNIQUE: 7.7 mCi F18 Piflufolastat (Pylarify) was injected intravenously. Full-ring PET imaging was performed from the skull base to thigh after the radiotracer. CT data was obtained and used for attenuation correction and anatomic localization. COMPARISON:  None Available. FINDINGS: NECK No radiotracer activity in neck lymph nodes. Incidental CT finding: None. CHEST New small radiotracer avid RIGHT lower paratracheal node with SUV max equal 5.1 on image 64. Similar similar radiotracer avid LEFT hilar nodes. No suspicious pulmonary nodules. Incidental CT finding: Port in the  anterior chest wall with tip in distal SVC. ABDOMEN/PELVIS Prostate: decreased radiotracer activity centrally within the prostate gland at the base seminal vesicles (image 175). Central activity in gland may relate to TURP defect. Lymph nodes: No pelvic or retroperitoneal lymphadenopathy. Liver: No evidence of liver metastasis. Incidental CT finding: None. SKELETON Marked progression radiotracer avid skeletal metastasis. Nearly every bone is involved in the axial skeleton and proximal appendicular skeleton. For example new lesion in the RIGHT humeral head with SUV max equal 24. New lesions in the LEFT acetabulum and femoral head with SUV max equal 15.9 image 179. Nearly every vertebral body is involved radiotracer avid lesions currently. Example new lesion at S5 with SUV max equal 10.2. New lesions throughout the sternum with SUV max equal 14.2. IMPRESSION: 1. Unfortunately, there is marked progression of skeletal metastasis with increased involvement in the axillary skeleton and new involvement in the appendicular skeleton. Nearly every bone is involved with intense radiotracer activity. 2. Some improvement in local recurrence within the prostate gland. 3. Similar mild mediastinal and LEFT hilar nodal metastasis. 4. No pulmonary metastasis or liver metastasis. Electronically Signed   By: Genevive Bi M.D.   On: 05/09/2023 17:15

## 2023-05-13 NOTE — Patient Instructions (Addendum)
 [facility] at [facility] Discharge Instructions   You were seen and examined today by Dr. Ellin Saba.  He reviewed the results of your lab work which are mostly normal/stable. Your PSA from 04/25/23 is elevated at 670, up from 392 previously.   He reviewed the results of your PET scan. It is showing marked progression of disease in your bones.   We will reach out to doctors at [facility] and [facility] to see if they have treatment options in the form of clinical trials.   Return as scheduled.    Thank you for choosing [facility] at [facility] to provide your oncology and hematology care.  To afford each patient quality time with our provider, please arrive at least 15 minutes before your scheduled appointment time.   If you have a lab appointment with the Cancer Center please come in thru the Main Entrance and check in at the main information desk.  You need to re-schedule your appointment should you arrive 10 or more minutes late.  We strive to give you quality time with our providers, and arriving late affects you and other patients whose appointments are after yours.  Also, if you no show three or more times for appointments you may be dismissed from the clinic at the providers discretion.     Again, thank you for choosing [facility].  Our hope is that these requests will decrease the amount of time that you wait before being seen by our physicians.       _____________________________________________________________  Should you have questions after your visit to [facility], please contact our office at [phone] and follow the prompts.  Our office hours are 8:00 a.m. and 4:30 p.m. Monday - Friday.  Please note that voicemails left after 4:00 p.m. may not be returned until the following business day.  We are closed weekends and major holidays.  You do have access to a nurse 24-7, just call the main number to the  clinic [phone] and do not press any options, hold on the line and a nurse will answer the phone.    For prescription refill requests, have your pharmacy contact our office and allow 72 hours.    Due to Covid, you will need to wear a mask upon entering the hospital. If you do not have a mask, a mask will be given to you at the Main Entrance upon arrival. For doctor visits, patients may have 1 support person age 10 or older with them. For treatment visits, patients can not have anyone with them due to social distancing guidelines and our immunocompromised population.

## 2023-05-14 ENCOUNTER — Encounter: Payer: Self-pay | Admitting: Hematology

## 2023-05-14 MED ORDER — FENTANYL 25 MCG/HR TD PT72: 1.0000 | MEDICATED_PATCH | TRANSDERMAL | 0 refills | Status: DC

## 2023-05-15 ENCOUNTER — Inpatient Hospital Stay: Payer: Medicare Other | Admitting: Licensed Clinical Social Worker

## 2023-05-15 DIAGNOSIS — C61 Malignant neoplasm of prostate: Secondary | ICD-10-CM

## 2023-05-15 NOTE — Progress Notes (Signed)
 [facility] CSW Progress Note  Visual merchandiser  spoke w/ pt and spouse over the phone to check in.  Pt recently received scan results which showed significant progression.  Pt and spouse are presently researching the offered treatment.  Emotional support provided regarding the difficult decision making at hand.  Pt and spouse requesting DME.  RN informed and is placing an order to have items delivered.  CSW to check in w/ pt and spouse in a couple of weeks.        Rachel Moulds, LCSW Clinical Social Worker [facility]

## 2023-05-16 ENCOUNTER — Ambulatory Visit: Payer: Medicare Other | Admitting: Urology

## 2023-05-16 ENCOUNTER — Ambulatory Visit: Payer: Medicare Other

## 2023-05-20 ENCOUNTER — Encounter: Payer: Self-pay | Admitting: Hematology

## 2023-05-21 ENCOUNTER — Other Ambulatory Visit: Payer: Self-pay

## 2023-05-21 DIAGNOSIS — C61 Malignant neoplasm of prostate: Secondary | ICD-10-CM

## 2023-05-21 MED ORDER — LEUPROLIDE ACETATE (6 MONTH) 45 MG ~~LOC~~ KIT: 45.0000 mg | PACK | SUBCUTANEOUS | Status: DC

## 2023-05-22 ENCOUNTER — Inpatient Hospital Stay: Attending: Hematology | Admitting: Licensed Clinical Social Worker

## 2023-05-22 ENCOUNTER — Other Ambulatory Visit: Payer: Self-pay

## 2023-05-22 DIAGNOSIS — C61 Malignant neoplasm of prostate: Secondary | ICD-10-CM

## 2023-05-22 NOTE — Progress Notes (Signed)
 [facility] CSW Progress Note  Visual merchandiser  spoke w/ pt and spouse over the phone.  Pt's spouse inquiring about insurance coverage for DME and the difference in coverage if pt transitions to hospice.  A shower chair was ordered for pt which was not covered by insurance and not delivered.  Pt no longer able to independently shower.  Pt scheduled for an appointment w/ radiology 3/5 to discuss treatment.  CSW to check in w/ pt/spouse on 3/6 following appointment.        Rachel Moulds, LCSW Clinical Social Worker [facility]

## 2023-05-23 ENCOUNTER — Inpatient Hospital Stay (HOSPITAL_COMMUNITY): Admission: RE | Admit: 2023-05-23 | Payer: BC Managed Care – PPO | Source: Ambulatory Visit

## 2023-05-23 ENCOUNTER — Ambulatory Visit
Admission: RE | Admit: 2023-05-23 | Discharge: 2023-05-23 | Disposition: A | Discharge status: Home / Self Care | Payer: Medicare Other | Source: Ambulatory Visit | Attending: Urology | Admitting: Urology

## 2023-05-23 ENCOUNTER — Encounter: Payer: Self-pay | Admitting: Urology

## 2023-05-23 VITALS — Ht 66.0 in

## 2023-05-23 DIAGNOSIS — C7951 Secondary malignant neoplasm of bone: Secondary | ICD-10-CM

## 2023-05-23 DIAGNOSIS — C61 Malignant neoplasm of prostate: Secondary | ICD-10-CM

## 2023-05-23 NOTE — Progress Notes (Signed)
 Nursing interview for Prostate cancer metastatic to multiple sites [facility])- Bone  Patient identity verified x2.  Patient reports average pain 8/10 mostly in sacrum but pain located in multiple regions of the pelvis, lower back and legs (Fentanyl patch and Oxycodone is helping greatly). Patient also reports some moderate SOB. Patient denies any other related issues at this time.  Meaningful use complete.  Vitals-None via phone  Patient will see his Urologist Dr. Annabell Howells on 07/18/23 and his NP on 05/30/23.  This concludes the interaction.  Ruel Favors, LPN

## 2023-05-23 NOTE — Progress Notes (Signed)
 Radiation Oncology         [phone] ________________________________  Outpatient Re-Consult visit - Conducted via telephone due to current COVID-19 concerns for limiting patient exposure  Name: Scott [name] MRN: [id number]  Date: 05/23/2023  DOB: Dec 07, 1955  [id number], Bernadene Bell, MD  Doreatha Massed, MD   REFERRING PHYSICIAN: Doreatha Massed, MD  DIAGNOSIS: 68 y.o. 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    ICD-10-CM   1. Malignant neoplasm of prostate metastatic to bone [facility])  C61    C79.51       HISTORY OF PRESENT ILLNESS: Scott [name] is a 68 y.o. male with a diagnosis of metastatic castrate resistant prostate cancer. In summary, he was initially diagnosed with advanced prostate cancer with associated pelvic lymphadenopathy in October 2018 with a PSA of 159.8 at that time.  He presented to the ED on 12/18/2016 with a two week history of urinary retention and hematuria.  A catheter was placed at that time and he was referred for evaluation in urology by Dr. Berneice Heinrich on 12/27/2016,  digital rectal examination was performed revealing a "rock hard and fixed" prostate.  The patient proceeded to transrectal ultrasound with 12 biopsies of the prostate that same day.  The prostate volume measured 81 cc.  Out of 12 core biopsies, all 12 were positive.  The maximum Gleason score was 4+5, and this was seen in the left mid lateral, left base, and left mid, all with perineural invasion. Gleason 4+4 was seen in the remaining 9 cores, with perineural invasion identified in all cores except the right mid lateral and right mid. Extraprostatic extension was also seen in the left base lateral.  A CT A/P performed shortly after biopsy showed bilateral non-bulky iliac adenopathy. Bone scan performed on 01/01/2017 showed a single nonspecific focus of  increased tracer in the left orbit.  He proceeded to TURP on 01/25/2017 with final surgical pathology showing prostatic adenocarcinoma, Gleason score 4+5, involving 75% of tissue with perineural invasion present. He was started on Lupron ADT at that time.  He initially had an excellent response with PSA decreased to 2.86 in February 2019 and nadired at 1.87 in December 2019.  He received his second dose of Lupron in 07/2017, but this was stopped thereafter due to intolerable hot flashes, sleep deprivation, and decreased mental clarity. Dr. Annabell Howells took over his care in 01/2018. He was started on estradiol patch in 04/2018 for symptom management. His PSA subsequently rose to 6 in 07/2018, and he was placed back on the Lupron ADT with an initial response and decrease of the PSA to 4.37 in 08/2018 but again began rising shortly thereafter up to 4.64 in August 2020 and 6.46 in September 2020.   He underwent restaging scans on 11/26/2018 with CT C/A/P which confirmed no adenopathy or definite findings of active bony malignancy. There was nonspecific subtle sclerosis laterally in a right 6th rib but bone scan was negative for skeletal metastasis.  He was referred to Dr. Ellin Saba on 11/28/2018, who ordered Axumin PET scan. This was performed on 12/11/2018 and showed small subcentimeter periaortic lymph nodes and left external ilial lymph nodes, with only low-grade activity and favoring reactive nodes but there was asymmetric activity in the right lobe of the prostate gland without evidence of distant metastatic disease or skeletal metastasis.  His PSA continued to gradually rise despite resuming the Lupron ADT  with a PSA at 7.31 in 01/2019.  We met with the patient on 02/24/2019. At that time, he opted to defer radiation therapy and continue with ADT alone. He was seen by Dr. Annabell Howells on 04/10/19 and started Avodart. His PSA, however, continued to rise and reached 15.8 on 09/28/19. He underwent restaging CT A/P on 11/03/19,  which revealed a mass in the posterior urinary bladder, a new sclerotic lesion in the left anterior vertebral body of L1, and increased mild left periaortic adenopathy. He was taken for debulking TURP on 11/10/19, with pathology confirming the presence of adenocarcinoma. He then received 6 cycles of docetaxel from 11/16/19 to 02/29/20 under the care of Dr. Ellin Saba.  His PSA decreased on the docetaxel, to a nadir of 2.17 on 03/22/20. Unfortunately, this increased back up to 4.77 in 04/2020 and 12.9 on 05/26/20. He was complaining of progressive low back pain so he proceeded to restaging CT A/P on 06/03/20 showing a slight increase in size of left retroperitoneal and left external iliac lymph nodes with progressive sclerosis involving the L2 vertebral body and a new area of sclerosis involving the anterior aspect of the L1 vertebral body. This was followed by a bone scan on 06/14/20 also confirming progressive bony metastatic disease, with increased size and intensity of activity at L2 and a new focus of activity at L1. PSA was further increased at 18.81 on 06/16/20 so he was started on Zytiga, with his first dose on 06/23/20. We met back with the patient on 07/19/20 and he elected to proceed with palliative radiation to the painful sites of osseous metastases at L1 and L2, completed 08/12/20. He did experience significant pain relief with this treatment and has continued in close follow up with Dr. Ellin Saba and Dr. Annabell Howells continuing on Daralutamide and Eligard as well as dutasteride and estradiol patches for symptom management.  Unfortunately, his PSA continued to rise despite treatment with a PSA at 3.58 in June 2023, increasing to 9.84 in August 2023 and 10.6 in October 2023.    He developed progressive low back pain and restaging scans were performed for further evaluation.  The bone scan from 03/23/2022 showed progression of disease at L2 (previously treated) and a new lesion at L4.  A CT A/P was performed on 03/26/2022  and showed increased size of a periaortic node, new pulmonary nodules and a new bony metastasis at L4. They updated his molecular studies with a Guardant360: MSI high not detected. Androgen resistance present. He also had bone biopsy of L4 which confirmed metastatic prostate cancer. He elected to proceed with palliative radiation for symptom management. He completed a 2 week course of palliative re-irradiation to L2 while also treating the new disease at L4 and his systemic treatment was changed to Cabazitaxel, cycle 1 on 05/07/22.   Unfortunately, he continued with disease progression so the Cabazitaxel was discontinued after cycle 4 in 06/2022. He started Pluvicto, cycle 1 on 09/26/22 and had a repeat TURP procedure on 12/18/22 for removal of bladder stones and alleviation of bladder outlet obstruction from tumor regrowth. He had 5 cycles of Pluvicto but this was discontinued in 03/2023 due to persistent rise in PSA, from 392 in 02/2023 to 670 on 04/25/23. He struggled with tolerating the Pluvicto infusions which resulted in a few ED visits for management of dehydration and severe GI issues.  He has continued with marked disease progression of the bony metastases demonstrated on repeat PSMA PET from 05/09/23 and has therefore been kindly referred back to  [place] today to discuss the potential role for Xofigo infusions. Unfortunately, he has had rapid decline in functional status and significant weight loss over the past 2 months. His Hgb has consistently remained below 10, currently at 9.2 on labs from 05/13/23. He has significant diffuse pain in his skeleton but reports that the pain in his sacrum is the most bothersome. He has recently started using Fentanyl patch due to ineffective pain control with Oxycodone.  PREVIOUS RADIATION THERAPY: Yes  04/10/2022 through 04/23/2022: The progressive disease at L2 was re-irradiated while also treating the new disease at L4 with 30 Gy in 10 fractions of 3 Gy each.  5/16-5/27/22:   The spine from T12 to L3 inclusive received 30 Gy in 10 fractions   PAST MEDICAL HISTORY:  Past Medical History:  Diagnosis Date   Anemia    Bladder obstruction    Complication of anesthesia    Diverticulitis 2015   Dyspnea    Essential hypertension, benign 01/26/2019   at doctor's office elevated, normal at home, no medications at this time   GERD (gastroesophageal reflux disease)    History of basal cell cancer    HLD (hyperlipidemia) 01/26/2019   Hypothyroidism    history of, not currently taking medication   PONV (postoperative nausea and vomiting)    Prostate cancer (HCC)    metastatic to bones   Vitamin D deficiency disease 01/26/2019      PAST SURGICAL HISTORY: Past Surgical History:  Procedure Laterality Date   CYSTOSCOPY W/ URETERAL STENT PLACEMENT Left 11/10/2019   Procedure: CYSTOSCOPY;  Surgeon: Bjorn Pippin, MD;  Location: [facility];  Service: Urology;  Laterality: Left;   ESOPHAGOGASTRODUODENOSCOPY (EGD) WITH PROPOFOL N/A 07/07/2020   Procedure: ESOPHAGOGASTRODUODENOSCOPY (EGD) WITH PROPOFOL;  Surgeon: Corbin Ade, MD;  Location: [facility];  Service: Endoscopy;  Laterality: N/A;  pm appt   HERNIA REPAIR  1995   UMBILICAL    IR FLUORO GUIDED NEEDLE PLC ASPIRATION/INJECTION LOC  04/09/2022   IR IMAGING GUIDED PORT INSERTION  10/11/2022   MALONEY DILATION N/A 07/07/2020   Procedure: Elease Hashimoto DILATION;  Surgeon: Corbin Ade, MD;  Location: [facility];  Service: Endoscopy;  Laterality: N/A;   PROSTATE SURGERY     SKIN CANCER EXCISION     TONSILLECTOMY Bilateral    TOOTH EXTRACTION     front left tooth pulled but no surgery or stitches   TRANSURETHRAL RESECTION OF BLADDER TUMOR N/A 12/18/2022   Procedure: REMOVAL OF BLADDER STONES;  Surgeon: Bjorn Pippin, MD;  Location: [facility];  Service: Urology;  Laterality: N/A;  1 HR FOR CASE   TRANSURETHRAL RESECTION OF PROSTATE N/A 01/25/2017   Procedure: TRANSURETHRAL RESECTION OF THE PROSTATE (TURP);  Surgeon: Sebastian Ache, MD;  Location: [facility];  Service: Urology;  Laterality: N/A;   TRANSURETHRAL RESECTION OF PROSTATE N/A 11/10/2019   Procedure: TRANSURETHRAL RESECTION OF THE PROSTATE (TURP);  Surgeon: Bjorn Pippin, MD;  Location: [facility];  Service: Urology;  Laterality: N/A;   TRANSURETHRAL RESECTION OF PROSTATE N/A 12/18/2022   Procedure: TRANSURETHRAL RESECTION OF THE PROSTATE (TURP);  Surgeon: Bjorn Pippin, MD;  Location: [facility];  Service: Urology;  Laterality: N/A;   WISDOM TOOTH EXTRACTION      FAMILY HISTORY:  Family History  Problem Relation Age of Onset   Emphysema Mother    Alzheimer's disease Father    Prostate cancer Father        dx in his 80s   Prostate cancer Brother  dx in his 72s   Breast cancer Maternal Grandmother        60s   Pancreatic cancer Maternal Grandfather        75s   Dementia Paternal Grandmother    Heart attack Paternal Grandfather    Colon cancer Neg Hx     SOCIAL HISTORY:  Social History   Socioeconomic History   Marital status: Married    Spouse name: Not on file   Number of children: 1   Years of education: Not on file   Highest education level: Bachelor's degree (e.g., BA, AB, BS)  Occupational History    Comment: working full time  Tobacco Use   Smoking status: Former    Current packs/day: 0.00    Average packs/day: 1.5 packs/day for 35.0 years (52.5 ttl pk-yrs)    Types: Cigarettes    Start date: 01/11/1969    Quit date: 01/12/2004    Years since quitting: 19.3   Smokeless tobacco: Never   Tobacco comments:    OVER 30 YEARS HX OF SMOKING   Vaping Use   Vaping status: Never Used  Substance and Sexual Activity   Alcohol use: Not Currently    Comment: heavy drinker until 2004   Drug use: Not Currently    Types: Marijuana   Sexual activity: Not Currently  Other Topics Concern   Not on file  Social History Narrative   Married for 19 years.Works for [employer] from home.   Social Drivers of Corporate investment banker  Strain: Low Risk  (08/24/2022)   Overall Financial Resource Strain (CARDIA)    Difficulty of Paying Living Expenses: Not very hard  Food Insecurity: No Food Insecurity (05/23/2023)   Hunger Vital Sign    Worried About Running Out of Food in the Last Year: Never true    Ran Out of Food in the Last Year: Never true  Transportation Needs: No Transportation Needs (05/23/2023)   PRAPARE - Administrator, Civil Service (Medical): No    Lack of Transportation (Non-Medical): No  Physical Activity: Insufficiently Active (08/24/2022)   Exercise Vital Sign    Days of Exercise per Week: 5 days    Minutes of Exercise per Session: 20 min  Stress: Stress Concern Present (08/24/2022)   [employer] of Occupational Health - Occupational Stress Questionnaire    Feeling of Stress : To some extent  Social Connections: Socially Isolated (08/24/2022)   Social Connection and Isolation Panel [NHANES]    Frequency of Communication with Friends and Family: Once a week    Frequency of Social Gatherings with Friends and Family: Once a week    Attends Religious Services: Never    Database administrator or Organizations: No    Attends Engineer, structural: Not on file    Marital Status: Married  Catering manager Violence: Not At Risk (05/23/2023)   Humiliation, Afraid, Rape, and Kick questionnaire    Fear of Current or Ex-Partner: No    Emotionally Abused: No    Physically Abused: No    Sexually Abused: No    ALLERGIES: Crestor [rosuvastatin], Ivp dye [iodinated contrast media], and Wellbutrin [bupropion]  MEDICATIONS:  Current Outpatient Medications  Medication Sig Dispense Refill   Cholecalciferol 125 MCG (5000 UT) capsule Take 10,000 Units by mouth daily.     COMPRO 25 MG suppository Place 25 mg rectally every 8 (eight) hours as needed for nausea or vomiting.     dexamethasone (DECADRON) 4 MG tablet Take  1 tablet (4 mg total) by mouth daily. 30 tablet 1   docusate sodium (COLACE) 100 MG  capsule Take 100 mg by mouth daily.     dronabinol (MARINOL) 5 MG capsule Take 1 capsule (5 mg total) by mouth 2 (two) times daily before lunch and supper. 60 capsule 1   dutasteride (AVODART) 0.5 MG capsule TAKE 1 CAPSULE(0.5 MG) BY MOUTH DAILY 90 capsule 3   Elastic Bandages & Supports (B-4 MED COMPRESSION HOSE MENS) MISC Wear as needed when awake to help prevent leg swelling 2 each 0   estradiol (VIVELLE-DOT) 0.1 MG/24HR patch APPLY 1 PATCH(0.1 MG) TOPICALLY TO THE SKIN 2 TIMES A WEEK 8 patch 12   fentaNYL (DURAGESIC) 25 MCG/HR Place 1 patch onto the skin every 3 (three) days. 10 patch 0   MAGNESIUM PO Take 1 tablet by mouth at bedtime.     megestrol (MEGACE) 40 MG/ML suspension Take 10 mLs (400 mg total) by mouth 2 (two) times daily. 480 mL 2   MELATONIN PO Take 40 mg by mouth at bedtime.     Multiple Vitamin (MULTIVITAMIN WITH MINERALS) TABS tablet Take 1 tablet by mouth daily.     NATTOKINASE PO Take 2 capsules by mouth daily.     NP THYROID 120 MG tablet Take 0.5 tablets (60 mg total) by mouth every morning. 45 tablet 1   ondansetron (ZOFRAN ODT) 8 MG disintegrating tablet Take 1 tablet (8 mg total) by mouth every 8 (eight) hours as needed for nausea or vomiting. 60 tablet 3   Oxycodone HCl 10 MG TABS Take 1 tablet (10 mg total) by mouth every 6 (six) hours as needed. 120 tablet 0   phenazopyridine (PYRIDIUM) 200 MG tablet Take 1 tablet (200 mg total) by mouth 3 (three) times daily. 45 tablet 0   prochlorperazine (COMPAZINE) 10 MG tablet Take 1 tablet (10 mg total) by mouth every 6 (six) hours as needed for nausea or vomiting. 60 tablet 3   pyridOXINE (VITAMIN B6) 25 MG tablet Take 25 mg by mouth daily.     thiamine (VITAMIN B-1) 50 MG tablet Take 1 tablet (50 mg total) by mouth daily. 90 tablet 1   traMADol (ULTRAM) 50 MG tablet Take 2 tablets (100 mg total) by mouth every 6 (six) hours as needed. 120 tablet 0   vitamin E 180 MG (400 UNITS) capsule Take 400 Units by mouth daily.      VITAMIN K PO Take 1 capsule by mouth daily.     Current Facility-Administered Medications  Medication Dose Route Frequency Provider Last Rate Last Admin   [START ON 07/18/2023] leuprolide (6 Month) (ELIGARD) injection 45 mg  45 mg Subcutaneous Q6 months Bjorn Pippin, MD        REVIEW OF SYSTEMS:  On review of systems, the patient reports that he is doing fair overall. He denies any chest pain, shortness of breath, cough, fevers, chills, night sweats, or recent unintended weight changes. He denies any bowel disturbances, and denies abdominal pain, nausea or vomiting. He continues with low back pain, particularly in the sacral region but he denies any focal weakness, paraesthesias or other new musculoskeletal or joint aches or pains. The Oxycodone that he was taking became ineffective so he has recently changed to Fentanyl patch and 4mg  Dexamethasone daily. A complete review of systems is obtained and is otherwise negative.    PHYSICAL EXAM:  Wt Readings from Last 3 Encounters:  05/13/23 139 lb 5.3 oz (63.2 kg)  03/19/23 147  lb 11.2 oz (67 kg)  01/30/23 153 lb 1.6 oz (69.4 kg)   Temp Readings from Last 3 Encounters:  05/13/23 (!) 96.8 F (36 C) (Tympanic)  04/25/23 97.6 F (36.4 C)  03/19/23 98.6 F (37 C) (Oral)   BP Readings from Last 3 Encounters:  05/13/23 129/80  05/02/23 (!) 144/84  04/25/23 138/70   Pulse Readings from Last 3 Encounters:  05/13/23 88  05/02/23 90  04/25/23 88   Pain Assessment Pain Score: 8  Pain Loc: Back (Sacrum)/10  Unable to assess due to telephone consult visit format.  KPS = 90  100 - Normal; no complaints; no evidence of disease. 90   - Able to carry on normal activity; minor signs or symptoms of disease. 80   - Normal activity with effort; some signs or symptoms of disease. 53   - Cares for self; unable to carry on normal activity or to do active work. 60   - Requires occasional assistance, but is able to care for most of his personal  needs. 50   - Requires considerable assistance and frequent medical care. 40   - Disabled; requires special care and assistance. 30   - Severely disabled; hospital admission is indicated although death not imminent. 20   - Very sick; hospital admission necessary; active supportive treatment necessary. 10   - Moribund; fatal processes progressing rapidly. 0     - Dead  Karnofsky DA, Abelmann WH, Craver LS and Burchenal [facility] [phone]) The use of the nitrogen mustards in the palliative treatment of carcinoma: with particular reference to bronchogenic carcinoma Cancer 1 634-56  LABORATORY DATA:  Lab Results  Component Value Date   WBC 6.1 05/13/2023   HGB 9.2 (L) 05/13/2023   HCT 27.7 (L) 05/13/2023   MCV 91.4 05/13/2023   PLT 248 05/13/2023   Lab Results  Component Value Date   NA 132 (L) 05/13/2023   K 4.0 05/13/2023   CL 102 05/13/2023   CO2 19 (L) 05/13/2023   Lab Results  Component Value Date   ALT 13 05/13/2023   AST 25 05/13/2023   ALKPHOS 816 (H) 05/13/2023   BILITOT 0.9 05/13/2023     RADIOGRAPHY: NM PET (PSMA) SKULL TO MID THIGH Result Date: 05/09/2023 CLINICAL DATA:  Prostate carcinoma with biochemical recurrence. Patient undergoing PTC 177 PSMA therapy. Rising PSA. Concern for progression on therapy. EXAM: NUCLEAR MEDICINE PET SKULL BASE TO THIGH TECHNIQUE: 7.7 mCi F18 Piflufolastat (Pylarify) was injected intravenously. Full-ring PET imaging was performed from the skull base to thigh after the radiotracer. CT data was obtained and used for attenuation correction and anatomic localization. COMPARISON:  None Available. FINDINGS: NECK No radiotracer activity in neck lymph nodes. Incidental CT finding: None. CHEST New small radiotracer avid RIGHT lower paratracheal node with SUV max equal 5.1 on image 64. Similar similar radiotracer avid LEFT hilar nodes. No suspicious pulmonary nodules. Incidental CT finding: Port in the anterior chest wall with tip in distal SVC. ABDOMEN/PELVIS  Prostate: decreased radiotracer activity centrally within the prostate gland at the base seminal vesicles (image 175). Central activity in gland may relate to TURP defect. Lymph nodes: No pelvic or retroperitoneal lymphadenopathy. Liver: No evidence of liver metastasis. Incidental CT finding: None. SKELETON Marked progression radiotracer avid skeletal metastasis. Nearly every bone is involved in the axial skeleton and proximal appendicular skeleton. For example new lesion in the RIGHT humeral head with SUV max equal 24. New lesions in the LEFT acetabulum and femoral head with SUV max  equal 15.9 image 179. Nearly every vertebral body is involved radiotracer avid lesions currently. Example new lesion at S5 with SUV max equal 10.2. New lesions throughout the sternum with SUV max equal 14.2. IMPRESSION: 1. Unfortunately, there is marked progression of skeletal metastasis with increased involvement in the axillary skeleton and new involvement in the appendicular skeleton. Nearly every bone is involved with intense radiotracer activity. 2. Some improvement in local recurrence within the prostate gland. 3. Similar mild mediastinal and LEFT hilar nodal metastasis. 4. No pulmonary metastasis or liver metastasis. Electronically Signed   By: Genevive Bi M.D.   On: 05/09/2023 17:15      IMPRESSION/PLAN: This visit was conducted via telephone to spare the patient unnecessary potential exposure in the healthcare setting during the current COVID-19 pandemic. 1. 68 y.o. 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  Today, we talked to the patient about the findings and workup thus far. We discussed the natural history of metastatic prostate cancer and general treatment, highlighting the role of Xogifo infusions in the management. We focused on the details of logistics and  delivery. At present, he is not an ideal candidate for Xofigo given his myelosuppression from recent chemotherapy and Pluvicto. We recommend continued monitoring of labs to ensure his Hgb returns to >10 so that it is safe to proceed with Xofigo treatments. We reviewed the anticipated acute and late sequelae associated with Xofigo in this setting and feel that the risks outweigh the benefit at present. We also discussed the potential role for palliative radiation to the painful disease in the sacrum if the Fentanyl patches doe not provide good relief. Given his significant challenges with mobility/transport, we discussed a single fraction treatment that would likely provide good pain relief for 2-3 months and could be repeated thereafter if needed. The patient and his wife were encouraged to ask questions that were answered to their stated satisfaction.  At the end of our conversation, the patient elects to reserve Xofigo infusions for the future if his Hgb improves to >10 and he feels strong enough to tolerate the monthly visits for infusions. He has just recently started using the Fentanyl patch so he is going to give this some time to see if his pain is well controlled but if not, he will let us know and we are happy to coordinate for CT SIM/treatment planning for the single fraction palliative radiation to the sacrum. We will share this information with Dr. Ellin Saba and Dr. Annabell Howells and look forward to continuing to follow his progress.  Given current concerns for patient exposure during the COVID-19 pandemic, this encounter was conducted via telephone visit. The patient has given verbal consent for this type of encounter. The attendants for this meeting include Margaretmary Dys MD, Farrel Conners, and patient, Lorie Melichar and his wife, Gaylyn Lambert. During the encounter, Margaretmary Dys MD, and Farrel Conners, were located at [facility] Radiation Oncology Department.  Patient,  Zackery Brine and his wife, Gaylyn Lambert, were located at home.  We personally spent 45 minutes in this encounter including chart review, reviewing radiological studies, telephone conversation with the patient and his wife, entering orders and completing documentation.     Marguarite Arbour, PA-C    Margaretmary Dys, MD  [facility]  Radiation Oncology Direct Dial: [phone]  Fax: [phone] [email or web address]  Skype  LinkedIn

## 2023-05-24 ENCOUNTER — Inpatient Hospital Stay: Admitting: Licensed Clinical Social Worker

## 2023-05-24 DIAGNOSIS — C61 Malignant neoplasm of prostate: Secondary | ICD-10-CM

## 2023-05-24 NOTE — Progress Notes (Signed)
 [facility] CSW Progress Note  Clinical Child psychotherapist  contacted pt and spouse by phone to provide emotional support.  Pt had a consultation w/ radiation 3/6.  Pt and spouse verbalized agreement w/ a hospice referral.  RN informed and referral placed.  CSW to reach out to pt/spouse following intake.        Rachel Moulds, LCSW Clinical Social Worker [facility]

## 2023-05-29 ENCOUNTER — Inpatient Hospital Stay: Admitting: Licensed Clinical Social Worker

## 2023-05-29 DIAGNOSIS — C61 Malignant neoplasm of prostate: Secondary | ICD-10-CM

## 2023-05-29 NOTE — Progress Notes (Signed)
 [facility] CSW Progress Note  Visual merchandiser  spoke with pt and spouse over the phone.  Spouse confirmed hospice completed their intake on 3/11 and are scheduled to arrive at the home this afternoon.  No concerns reported at this time.  CSW to check in next week.      Rachel Moulds, LCSW Clinical Social Worker [facility]

## 2023-05-30 ENCOUNTER — Ambulatory Visit (INDEPENDENT_AMBULATORY_CARE_PROVIDER_SITE_OTHER): Payer: PRIVATE HEALTH INSURANCE | Admitting: Urology

## 2023-05-30 ENCOUNTER — Telehealth: Payer: Self-pay

## 2023-05-30 ENCOUNTER — Encounter: Payer: Self-pay | Admitting: Urology

## 2023-05-30 VITALS — BP 113/73 | HR 73 | Temp 98.4°F

## 2023-05-30 DIAGNOSIS — C7951 Secondary malignant neoplasm of bone: Secondary | ICD-10-CM

## 2023-05-30 DIAGNOSIS — Z515 Encounter for palliative care: Secondary | ICD-10-CM

## 2023-05-30 DIAGNOSIS — C61 Malignant neoplasm of prostate: Secondary | ICD-10-CM | POA: Diagnosis not present

## 2023-05-30 DIAGNOSIS — Z7189 Other specified counseling: Secondary | ICD-10-CM

## 2023-05-30 DIAGNOSIS — R54 Age-related physical debility: Secondary | ICD-10-CM

## 2023-05-30 DIAGNOSIS — Z9189 Other specified personal risk factors, not elsewhere classified: Secondary | ICD-10-CM

## 2023-05-30 LAB — BLADDER SCAN AMB NON-IMAGING: Scan Result: 2

## 2023-05-30 NOTE — Progress Notes (Unsigned)
 Name: Scott [name] DOB: 02/26/1956 MRN: [id number]  History of Present Illness: Scott [name] is a 68 y.o. male who presents today for follow up visit at [facility]. He is accompanied by his wife Scott [name]. - GU history: 1. Metastatic castrate-resistant prostate cancer.  2. LUTS.  3. Postprocedural membranous urethral stricture.   At last visit with Dr. Annabell Howells on 05/02/2023: - Cystoscopy performed to assess his reduced stream. "His urethra had studding with tumor implants mostly anteriorly in the mid and bulbar urethra. There was some tumor at the external sphincter with a moderate membranous stricture that was about 10-12 fr but wouldn't allow the scope to pass...unable to assess the bladder." - Discussed options including observation, urethral dilation with possible tumor resection, or SP tube placement. - The plan was "return in 4-6 weeks to reassess as his cancer seems to be progressing rapidly and I don't want to be aggressive unless absolutely necessary."   Since last visit: > 05/09/2023: PSMA PET scan showed "marked progression of skeletal metastasis with increased involvement in the axillary skeleton and new involvement in the appendicular skeleton. Nearly every bone is involved with intense radiotracer activity."  > 05/13/2023: Follow up with Dr. Ellin Saba.  - Pluvicto discontinued.  - Creatinine normal (0.66). - Seeking clinical trials at outside institutions.  - Will continue estradiol twice daily for hot flashes. - Alternating Zofran with Compazine PRN for nausea/vomiting. - For pain management: Fentanyl 25 mcg patches and Dexamethasone 4 mg in the mornings. Oxycodone 15 mg every 6 hours for breakthrough pain; may increase to 20 mg PRN. - On Megace twice daily for appetite.  Today: He reports that his urinary stream is no better / no worse than when he was here last time. Reports increased nocturia to 5x night. Denies dysuria or gross hematuria. He reports constant  severe bone pain particularly throughout his pelvis; states that hospice increased his narcotic pain medication further yesterday.  Medications: Current Outpatient Medications  Medication Sig Dispense Refill   Cholecalciferol 125 MCG (5000 UT) capsule Take 10,000 Units by mouth daily.     COMPRO 25 MG suppository Place 25 mg rectally every 8 (eight) hours as needed for nausea or vomiting.     dexamethasone (DECADRON) 4 MG tablet Take 1 tablet (4 mg total) by mouth daily. 30 tablet 1   docusate sodium (COLACE) 100 MG capsule Take 100 mg by mouth daily.     dronabinol (MARINOL) 5 MG capsule Take 1 capsule (5 mg total) by mouth 2 (two) times daily before lunch and supper. 60 capsule 1   dutasteride (AVODART) 0.5 MG capsule TAKE 1 CAPSULE(0.5 MG) BY MOUTH DAILY 90 capsule 3   Elastic Bandages & Supports (B-4 MED COMPRESSION HOSE MENS) MISC Wear as needed when awake to help prevent leg swelling 2 each 0   estradiol (VIVELLE-DOT) 0.1 MG/24HR patch APPLY 1 PATCH(0.1 MG) TOPICALLY TO THE SKIN 2 TIMES A WEEK 8 patch 12   fentaNYL (DURAGESIC) 25 MCG/HR Place 1 patch onto the skin every 3 (three) days. 10 patch 0   MAGNESIUM PO Take 1 tablet by mouth at bedtime.     megestrol (MEGACE) 40 MG/ML suspension Take 10 mLs (400 mg total) by mouth 2 (two) times daily. 480 mL 2   MELATONIN PO Take 40 mg by mouth at bedtime.     Multiple Vitamin (MULTIVITAMIN WITH MINERALS) TABS tablet Take 1 tablet by mouth daily.     NATTOKINASE PO Take 2 capsules by mouth daily.  NP THYROID 120 MG tablet Take 0.5 tablets (60 mg total) by mouth every morning. 45 tablet 1   ondansetron (ZOFRAN ODT) 8 MG disintegrating tablet Take 1 tablet (8 mg total) by mouth every 8 (eight) hours as needed for nausea or vomiting. 60 tablet 3   Oxycodone HCl 10 MG TABS Take 1 tablet (10 mg total) by mouth every 6 (six) hours as needed. 120 tablet 0   phenazopyridine (PYRIDIUM) 200 MG tablet Take 1 tablet (200 mg total) by mouth 3 (three) times  daily. 45 tablet 0   prochlorperazine (COMPAZINE) 10 MG tablet Take 1 tablet (10 mg total) by mouth every 6 (six) hours as needed for nausea or vomiting. 60 tablet 3   pyridOXINE (VITAMIN B6) 25 MG tablet Take 25 mg by mouth daily.     thiamine (VITAMIN B-1) 50 MG tablet Take 1 tablet (50 mg total) by mouth daily. 90 tablet 1   traMADol (ULTRAM) 50 MG tablet Take 2 tablets (100 mg total) by mouth every 6 (six) hours as needed. 120 tablet 0   vitamin E 180 MG (400 UNITS) capsule Take 400 Units by mouth daily.     VITAMIN K PO Take 1 capsule by mouth daily.     Current Facility-Administered Medications  Medication Dose Route Frequency Provider Last Rate Last Admin   [START ON 07/18/2023] leuprolide (6 Month) (ELIGARD) injection 45 mg  45 mg Subcutaneous Q6 months Bjorn Pippin, MD        Allergies: Allergies  Allergen Reactions   Crestor [Rosuvastatin] Other (See Comments)    myalgia   Ivp Dye [Iodinated Contrast Media] Swelling    Swelling on head, took Benadryl and swelling reduced   Wellbutrin [Bupropion] Hives    Past Medical History:  Diagnosis Date   Anemia    Bladder obstruction    Complication of anesthesia    Diverticulitis 2015   Dyspnea    Essential hypertension, benign 01/26/2019   at doctor's office elevated, normal at home, no medications at this time   GERD (gastroesophageal reflux disease)    History of basal cell cancer    HLD (hyperlipidemia) 01/26/2019   Hypothyroidism    history of, not currently taking medication   PONV (postoperative nausea and vomiting)    Prostate cancer (HCC)    metastatic to bones   Vitamin D deficiency disease 01/26/2019   Past Surgical History:  Procedure Laterality Date   CYSTOSCOPY W/ URETERAL STENT PLACEMENT Left 11/10/2019   Procedure: CYSTOSCOPY;  Surgeon: Bjorn Pippin, MD;  Location: [facility];  Service: Urology;  Laterality: Left;   ESOPHAGOGASTRODUODENOSCOPY (EGD) WITH PROPOFOL N/A 07/07/2020   Procedure:  ESOPHAGOGASTRODUODENOSCOPY (EGD) WITH PROPOFOL;  Surgeon: Corbin Ade, MD;  Location: [facility];  Service: Endoscopy;  Laterality: N/A;  pm appt   HERNIA REPAIR  1995   UMBILICAL    IR FLUORO GUIDED NEEDLE PLC ASPIRATION/INJECTION LOC  04/09/2022   IR IMAGING GUIDED PORT INSERTION  10/11/2022   MALONEY DILATION N/A 07/07/2020   Procedure: Elease Hashimoto DILATION;  Surgeon: Corbin Ade, MD;  Location: [facility];  Service: Endoscopy;  Laterality: N/A;   PROSTATE SURGERY     SKIN CANCER EXCISION     TONSILLECTOMY Bilateral    TOOTH EXTRACTION     front left tooth pulled but no surgery or stitches   TRANSURETHRAL RESECTION OF BLADDER TUMOR N/A 12/18/2022   Procedure: REMOVAL OF BLADDER STONES;  Surgeon: Bjorn Pippin, MD;  Location: [facility];  Service: Urology;  Laterality: N/A;  1 HR FOR CASE   TRANSURETHRAL RESECTION OF PROSTATE N/A 01/25/2017   Procedure: TRANSURETHRAL RESECTION OF THE PROSTATE (TURP);  Surgeon: Sebastian Ache, MD;  Location: [facility];  Service: Urology;  Laterality: N/A;   TRANSURETHRAL RESECTION OF PROSTATE N/A 11/10/2019   Procedure: TRANSURETHRAL RESECTION OF THE PROSTATE (TURP);  Surgeon: Bjorn Pippin, MD;  Location: [facility];  Service: Urology;  Laterality: N/A;   TRANSURETHRAL RESECTION OF PROSTATE N/A 12/18/2022   Procedure: TRANSURETHRAL RESECTION OF THE PROSTATE (TURP);  Surgeon: Bjorn Pippin, MD;  Location: [facility];  Service: Urology;  Laterality: N/A;   WISDOM TOOTH EXTRACTION     Family History  Problem Relation Age of Onset   Emphysema Mother    Alzheimer's disease Father    Prostate cancer Father        dx in his 66s   Prostate cancer Brother        dx in his 69s   Breast cancer Maternal Grandmother        47s   Pancreatic cancer Maternal Grandfather        24s   Dementia Paternal Grandmother    Heart attack Paternal Grandfather    Colon cancer Neg Hx    Social History   Socioeconomic History   Marital status: Married    Spouse name: Not on file    Number of children: 1   Years of education: Not on file   Highest education level: Bachelor's degree (e.g., BA, AB, BS)  Occupational History    Comment: working full time  Tobacco Use   Smoking status: Former    Current packs/day: 0.00    Average packs/day: 1.5 packs/day for 35.0 years (52.5 ttl pk-yrs)    Types: Cigarettes    Start date: 01/11/1969    Quit date: 01/12/2004    Years since quitting: 19.3   Smokeless tobacco: Never   Tobacco comments:    OVER 30 YEARS HX OF SMOKING   Vaping Use   Vaping status: Never Used  Substance and Sexual Activity   Alcohol use: Not Currently    Comment: heavy drinker until 2004   Drug use: Not Currently    Types: Marijuana   Sexual activity: Not Currently  Other Topics Concern   Not on file  Social History Narrative   Married for 19 years.Works for [employer] from home.   Social Drivers of Corporate investment banker Strain: Low Risk  (08/24/2022)   Overall Financial Resource Strain (CARDIA)    Difficulty of Paying Living Expenses: Not very hard  Food Insecurity: No Food Insecurity (05/23/2023)   Hunger Vital Sign    Worried About Running Out of Food in the Last Year: Never true    Ran Out of Food in the Last Year: Never true  Transportation Needs: No Transportation Needs (05/23/2023)   PRAPARE - Administrator, Civil Service (Medical): No    Lack of Transportation (Non-Medical): No  Physical Activity: Insufficiently Active (08/24/2022)   Exercise Vital Sign    Days of Exercise per Week: 5 days    Minutes of Exercise per Session: 20 min  Stress: Stress Concern Present (08/24/2022)   [employer] of Occupational Health - Occupational Stress Questionnaire    Feeling of Stress : To some extent  Social Connections: Socially Isolated (08/24/2022)   Social Connection and Isolation Panel [NHANES]    Frequency of Communication with Friends and Family: Once a week    Frequency of Social Gatherings  with Friends and Family:  Once a week    Attends Religious Services: Never    Database administrator or Organizations: No    Attends Engineer, structural: Not on file    Marital Status: Married  Catering manager Violence: Not At Risk (05/23/2023)   Humiliation, Afraid, Rape, and Kick questionnaire    Fear of Current or Ex-Partner: No    Emotionally Abused: No    Physically Abused: No    Sexually Abused: No    Review of Systems Constitutional: Patient denies any unintentional weight loss or change in strength lntegumentary: Patient denies any rashes or pruritus Cardiovascular: Patient denies chest pain or syncope Respiratory: Patient denies shortness of breath Gastrointestinal: Patient reports some constipation related to opioid analgesics; taking OTC stool softeners / laxatives  Musculoskeletal: Patient denies muscle cramps or weakness Neurologic: Patient denies convulsions or seizures Allergic/Immunologic: Patient denies recent allergic reaction(s) Hematologic/Lymphatic: Patient denies bleeding tendencies Endocrine: Patient denies heat/cold intolerance  GU: As per HPI.  OBJECTIVE Vitals:   05/30/23 1547  BP: 113/73  Pulse: 73  Temp: 98.4 F (36.9 C)   There is no height or weight on file to calculate BMI.  Physical Examination Constitutional: No obvious distress; patient is non-toxic appearing  Cardiovascular: No visible lower extremity edema.  Respiratory: The patient does not have audible wheezing/stridor; respirations do not appear labored  Gastrointestinal: Abdomen non-distended Musculoskeletal: Normal ROM of UEs  Skin: No obvious rashes/open sores  Neurologic: CN 2-12 grossly intact Psychiatric: Answered questions appropriately with normal affect  Hematologic/Lymphatic/Immunologic: No obvious bruises or sites of spontaneous bleeding  UA: trace protein, otherwise unremarkable PVR: 2 ml  ASSESSMENT Prostate cancer metastatic to multiple sites (HCC) - Plan: Urinalysis, Routine w  reflex microscopic, BLADDER SCAN AMB NON-IMAGING  Malignant neoplasm of prostate metastatic to bone (HCC) - Plan: Urinalysis, Routine w reflex microscopic, BLADDER SCAN AMB NON-IMAGING  At risk for retention of urine  Frailty  Counseling regarding end of life decision making  Hospice care patient  We had a detailed discussion addressing multiple considerations about his care, comfort, and goals as he moves forward in hospice care.   We reviewed the cystoscopic findings last month - unable to pass the scope therefore future indwelling Foley catheter placement will not be an option should he go into acute urinary retention, which he was cautioned is a significant risk related to his tumor burden, opioid-induced constipation, and neurogenic impacts from his cancer.   In addition to increasing the risk for acute urinary retention, we also discussed how opioid-induced constipation may contribute to increased sacropelvic pain. He was advised to continue laxatives / stool softeners as directed by his hospice / oncology team. He was also encouraged to inquire about possible utilization of Movantik since that medication is specifically indicated for opioid-induced constipation.   We discussed suprapubic catheter ("SP tube") placement as a management strategy for acute urinary retention since placement of an indwelling Foley catheter placement will not possible through his tortuous urethra. We reviewed that this is done via an outpatient surgical procedure performed by Interventional Radiology under sedation. We discussed risks such as surgical site pain, infections, skin irritation / breakdown, bacteriuria, symptomatic UTls, urine leakage around the tube, painful bladder spasms, gross hematuria which could result in anemia or clog the catheter, etc. Patient was advised that SP tube would stay in continuously and that the insertion site will close if catheter is removed for more than a few hours. Discussed  that the SP tube  would need to be exchanged routinely every 4 weeks to prevent catheter from becoming clogged with sediment and that SP tube exchanges are typically performed at a urology nurse visit or by a home health / hospice nurse.   Based on today's PVR he is currently able to empty his bladder completely so there is no emergent indication for SP tube placement. He and his wife wish to take a week long trip either to the beach or the mountains once more before his condition deteriorates beyond the point that such a trip would be possible; Mrs. Lograsso in particular has significant fear / anxiety about what could happen if he were to suddenly be unable to urinate while they are away from the [place] area / the healthcare system(s) / his established Urology providers with whom they are familiar. She is concerned that they could get out of the [place] region and find themselves without access to appropriate care. We discussed that most ERs will at least have an on-call urologist available and how there is an established protocol in every ER for transfer to a higher level of care / from one hospital to another if where a patient is seen cannot adequately treat a patient's emergent medical needs. They reported that his hospice team has offered to facilitate temporary hospice support with a coordinating hospice team wherever he may travel in [place].   That said, we also discussed the option to proceed with proactive SP tube placement now / in the near future rather than waiting to schedule that reactively if/when evidence of urinary retention occurs.   Ultimately he elected to hold off on SP tube placement for now. Per last visit note Dr. Annabell Howells discussed the possible option of urethral dilation with possible tumor resection - we did not discuss that option today however based on patient's overall clinical picture it seems questionable if he would fair well that level of intervention at this point.   We  agreed to plan for follow up in 4-6 weeks for PVR recheck or sooner if needed. Patient and wife verbalized understanding of and agreement with current plan. All questions were answered.  PLAN Advised the following: 1. Return in about 6 weeks (around 07/11/2023) for UA, PVR, & f/u with Evette Georges NP.  Orders Placed This Encounter  Procedures   Urinalysis, Routine w reflex microscopic   BLADDER SCAN AMB NON-IMAGING   Total time spent caring for the patient today was over 60 minutes. This includes time spent on the date of the visit reviewing the patient's chart before the visit, time spent during the visit, and time spent after the visit on documentation. Over 50% of that time was spent in face-to-face time with this patient for direct counseling. E&M based on time and complexity of medical decision making.  It has been explained that the patient is to follow regularly with their PCP in addition to all other providers involved in their care and to follow instructions provided by these respective offices. Patient advised to contact urology clinic if any urologic-pertaining questions, concerns, new symptoms or problems arise in the interim period.  There are no Patient Instructions on file for this visit.  Electronically signed by:  Donnita Falls, FNP   05/31/23    9:51 PM

## 2023-05-30 NOTE — Telephone Encounter (Signed)
 Patient's wife state's they are going to keep appointment. "please call patient / wife to see if they plan to keep his appointment today at 3:30pm... per chart, he just went on hospice care starting yesterday.Marland KitchenMarland Kitchen"

## 2023-05-31 DIAGNOSIS — Z9189 Other specified personal risk factors, not elsewhere classified: Secondary | ICD-10-CM | POA: Insufficient documentation

## 2023-05-31 LAB — URINALYSIS, ROUTINE W REFLEX MICROSCOPIC
Bilirubin, UA: NEGATIVE
Glucose, UA: NEGATIVE
Ketones, UA: NEGATIVE
Leukocytes,UA: NEGATIVE
Nitrite, UA: NEGATIVE
RBC, UA: NEGATIVE
Specific Gravity, UA: 1.025 (ref 1.005–1.030)
Urobilinogen, Ur: 0.2 mg/dL (ref 0.2–1.0)
pH, UA: 6 (ref 5.0–7.5)

## 2023-06-05 ENCOUNTER — Inpatient Hospital Stay: Admitting: Licensed Clinical Social Worker

## 2023-06-05 DIAGNOSIS — C61 Malignant neoplasm of prostate: Secondary | ICD-10-CM

## 2023-06-05 NOTE — Progress Notes (Signed)
 [facility] CSW Progress Note  Clinical Child psychotherapist  contacted pt and spouse by phone to check in.  Spouse reports hospice intake is complete and pt can request the level of service he needs as his needs increase.  Pt's spouse has connected with a grief counselor.  At this time the family is well supported.  CSW to check in again in one week.        Rachel Moulds, LCSW Clinical Social Worker [facility]

## 2023-06-06 ENCOUNTER — Ambulatory Visit: Payer: Medicare Other | Admitting: Internal Medicine

## 2023-06-12 ENCOUNTER — Inpatient Hospital Stay: Admitting: Licensed Clinical Social Worker

## 2023-06-12 DIAGNOSIS — C61 Malignant neoplasm of prostate: Secondary | ICD-10-CM

## 2023-06-12 NOTE — Progress Notes (Signed)
 [facility] CSW Progress Note  Visual merchandiser  spoke w/ pt and spouse by phone to check in.  Pt continues to do well at home w/ hospice services.  Pt's spouse continues to work w/ the hospice team's grief counselor and may begin utilizing sitter services soon to allow for time to leave the home and run errands.  No concerns reported at this time.  CSW to continue to provide support as appropriate.        Rachel Moulds, LCSW Clinical Social Worker [facility]

## 2023-06-19 ENCOUNTER — Inpatient Hospital Stay: Attending: Hematology | Admitting: Licensed Clinical Social Worker

## 2023-06-19 DIAGNOSIS — C61 Malignant neoplasm of prostate: Secondary | ICD-10-CM

## 2023-06-19 NOTE — Progress Notes (Signed)
 [facility] CSW Progress Note  Clinical Child psychotherapist  contacted pt's wife by phone to check in.   Pt reportedly doing well.  Pt's wife expressed appreciation for the new routine they have established and is thankful pt is able to continue to have quality time w/ family and friends.  CSW to check on pt/spouse in 1 week.      Rachel Moulds, LCSW Clinical Social Worker [facility]

## 2023-06-21 ENCOUNTER — Encounter: Payer: Self-pay | Admitting: Hematology

## 2023-06-26 ENCOUNTER — Telehealth: Payer: Self-pay

## 2023-06-26 NOTE — Telephone Encounter (Signed)
 Wife called requesting medical records from Dec 18 2022 to present day pt is in hospice care and disability needs more information wife states the deadline to turn in paper work is april 23

## 2023-07-04 ENCOUNTER — Inpatient Hospital Stay: Admitting: Licensed Clinical Social Worker

## 2023-07-04 DIAGNOSIS — C61 Malignant neoplasm of prostate: Secondary | ICD-10-CM

## 2023-07-05 ENCOUNTER — Encounter: Payer: Self-pay | Admitting: Hematology

## 2023-07-05 NOTE — Progress Notes (Signed)
 [facility] CSW Progress Note  Clinical Social Worker  called pt and spouse to check in.  Pt reportedly has gained weight and is doing well overall at this time.  CSW informed pt/spouse that disability paperwork was submitted on their behalf on 4/15.  CSW to remain available as appropriate to provide support.        Quenton Bruns, LCSW Clinical Social Worker [facility]

## 2023-07-11 ENCOUNTER — Ambulatory Visit: Admitting: Urology

## 2023-07-11 ENCOUNTER — Encounter: Payer: Self-pay | Admitting: Urology

## 2023-07-11 ENCOUNTER — Inpatient Hospital Stay: Admitting: Licensed Clinical Social Worker

## 2023-07-11 VITALS — BP 144/81 | HR 47

## 2023-07-11 DIAGNOSIS — C61 Malignant neoplasm of prostate: Secondary | ICD-10-CM | POA: Diagnosis not present

## 2023-07-11 DIAGNOSIS — C7951 Secondary malignant neoplasm of bone: Secondary | ICD-10-CM

## 2023-07-11 LAB — BLADDER SCAN AMB NON-IMAGING: Scan Result: 10

## 2023-07-11 NOTE — Progress Notes (Signed)
 Name: Scott [name] DOB: October 22, 1955 MRN: [id number]  History of Present Illness: Mr. Goth is a 68 y.o. male who presents today for follow up visit at [facility]. He is accompanied by his wife Adriana Hopping. - GU history: 1. Metastatic castrate-resistant prostate cancer.  2. LUTS (weak urinary stream).  3. Postprocedural membranous urethral stricture.  - 05/02/2023: Cystoscopy by Dr. Inga Manges: "His urethra had studding with tumor implants mostly anteriorly in the mid and bulbar urethra. There was some tumor at the external sphincter with a moderate membranous stricture that was about 10-12 fr but wouldn't allow the scope to pass...unable to assess the bladder."  - Discussed options including observation, urethral dilation with possible tumor resection, or SP tube placement. - The plan was "return in 4-6 weeks to reassess as his cancer seems to be progressing rapidly and I don't want to be aggressive unless absolutely necessary."   At last visit on 05/30/2023: - Persistent weak urinary stream; unchanged.  - Reported increased nocturia x5.  - Reported constant severe bone pain particularly throughout his pelvis; treated by hospice with narcotic pain medication.   Today: He reports that his urinary symptoms remain stable. Continues to have weak intermittent urinary stream, urgency, and nocturia x2-3. Denies dysuria, gross hematuria, hesitancy, straining to void, or sensations of incomplete emptying. States that his pelvic pain is currently well managed and that his appetite has improved; has been able to gain some weight back.    Medications: Current Outpatient Medications  Medication Sig Dispense Refill   Cholecalciferol  125 MCG (5000 UT) capsule Take 10,000 Units by mouth daily.     COMPRO  25 MG suppository Place 25 mg rectally every 8 (eight) hours as needed for nausea or vomiting.     dexamethasone  (DECADRON ) 4 MG tablet Take 1 tablet (4 mg total) by mouth daily. 30 tablet 1    docusate sodium  (COLACE) 100 MG capsule Take 100 mg by mouth daily.     dronabinol  (MARINOL ) 5 MG capsule Take 1 capsule (5 mg total) by mouth 2 (two) times daily before lunch and supper. 60 capsule 1   dutasteride  (AVODART ) 0.5 MG capsule TAKE 1 CAPSULE(0.5 MG) BY MOUTH DAILY 90 capsule 3   estradiol  (VIVELLE -DOT) 0.1 MG/24HR patch APPLY 1 PATCH(0.1 MG) TOPICALLY TO THE SKIN 2 TIMES A WEEK 8 patch 12   fentaNYL  (DURAGESIC ) 25 MCG/HR Place 1 patch onto the skin every 3 (three) days. 10 patch 0   MAGNESIUM  PO Take 1 tablet by mouth at bedtime.     megestrol  (MEGACE ) 40 MG/ML suspension Take 10 mLs (400 mg total) by mouth 2 (two) times daily. 480 mL 2   MELATONIN PO Take 40 mg by mouth at bedtime.     NATTOKINASE PO Take 2 capsules by mouth daily.     NP THYROID  120 MG tablet Take 0.5 tablets (60 mg total) by mouth every morning. 45 tablet 1   ondansetron  (ZOFRAN  ODT) 8 MG disintegrating tablet Take 1 tablet (8 mg total) by mouth every 8 (eight) hours as needed for nausea or vomiting. 60 tablet 3   Oxycodone  HCl 10 MG TABS Take 1 tablet (10 mg total) by mouth every 6 (six) hours as needed. 120 tablet 0   phenazopyridine  (PYRIDIUM ) 200 MG tablet Take 1 tablet (200 mg total) by mouth 3 (three) times daily. 45 tablet 0   prochlorperazine  (COMPAZINE ) 10 MG tablet Take 1 tablet (10 mg total) by mouth every 6 (six) hours as needed for nausea or vomiting.  60 tablet 3   pyridOXINE (VITAMIN B6) 25 MG tablet Take 25 mg by mouth daily.     thiamine (VITAMIN B-1) 50 MG tablet Take 1 tablet (50 mg total) by mouth daily. 90 tablet 1   vitamin E 180 MG (400 UNITS) capsule Take 400 Units by mouth daily.     VITAMIN K PO Take 1 capsule by mouth daily.     Elastic Bandages & Supports (B-4 MED COMPRESSION HOSE MENS) MISC Wear as needed when awake to help prevent leg swelling 2 each 0   Multiple Vitamin (MULTIVITAMIN WITH MINERALS) TABS tablet Take 1 tablet by mouth daily.     traMADol  (ULTRAM ) 50 MG tablet Take 2  tablets (100 mg total) by mouth every 6 (six) hours as needed. 120 tablet 0   Current Facility-Administered Medications  Medication Dose Route Frequency Provider Last Rate Last Admin   [START ON 07/18/2023] leuprolide  (6 Month) (ELIGARD ) injection 45 mg  45 mg Subcutaneous Q6 months Homero Luster, MD        Allergies: Allergies  Allergen Reactions   Crestor  [Rosuvastatin ] Other (See Comments)    myalgia   Ivp Dye [Iodinated Contrast Media] Swelling    Swelling on head, took Benadryl  and swelling reduced   Wellbutrin [Bupropion] Hives    Past Medical History:  Diagnosis Date   Anemia    Bladder obstruction    Complication of anesthesia    Diverticulitis 2015   Dyspnea    Essential hypertension, benign 01/26/2019   at doctor's office elevated, normal at home, no medications at this time   GERD (gastroesophageal reflux disease)    History of basal cell cancer    HLD (hyperlipidemia) 01/26/2019   Hypothyroidism    history of, not currently taking medication   PONV (postoperative nausea and vomiting)    Prostate cancer (HCC)    metastatic to bones   Vitamin D  deficiency disease 01/26/2019   Past Surgical History:  Procedure Laterality Date   CYSTOSCOPY W/ URETERAL STENT PLACEMENT Left 11/10/2019   Procedure: CYSTOSCOPY;  Surgeon: Homero Luster, MD;  Location: [facility];  Service: Urology;  Laterality: Left;   ESOPHAGOGASTRODUODENOSCOPY (EGD) WITH PROPOFOL  N/A 07/07/2020   Procedure: ESOPHAGOGASTRODUODENOSCOPY (EGD) WITH PROPOFOL ;  Surgeon: Suzette Espy, MD;  Location: [facility];  Service: Endoscopy;  Laterality: N/A;  pm appt   HERNIA REPAIR  1995   UMBILICAL    IR FLUORO GUIDED NEEDLE PLC ASPIRATION/INJECTION LOC  04/09/2022   IR IMAGING GUIDED PORT INSERTION  10/11/2022   MALONEY DILATION N/A 07/07/2020   Procedure: Londa Rival DILATION;  Surgeon: Suzette Espy, MD;  Location: [facility];  Service: Endoscopy;  Laterality: N/A;   PROSTATE SURGERY     SKIN CANCER EXCISION      TONSILLECTOMY Bilateral    TOOTH EXTRACTION     front left tooth pulled but no surgery or stitches   TRANSURETHRAL RESECTION OF BLADDER TUMOR N/A 12/18/2022   Procedure: REMOVAL OF BLADDER STONES;  Surgeon: Homero Luster, MD;  Location: [facility];  Service: Urology;  Laterality: N/A;  1 HR FOR CASE   TRANSURETHRAL RESECTION OF PROSTATE N/A 01/25/2017   Procedure: TRANSURETHRAL RESECTION OF THE PROSTATE (TURP);  Surgeon: Osborn Blaze, MD;  Location: [facility];  Service: Urology;  Laterality: N/A;   TRANSURETHRAL RESECTION OF PROSTATE N/A 11/10/2019   Procedure: TRANSURETHRAL RESECTION OF THE PROSTATE (TURP);  Surgeon: Homero Luster, MD;  Location: [facility];  Service: Urology;  Laterality: N/A;   TRANSURETHRAL RESECTION OF  PROSTATE N/A 12/18/2022   Procedure: TRANSURETHRAL RESECTION OF THE PROSTATE (TURP);  Surgeon: Homero Luster, MD;  Location: [facility];  Service: Urology;  Laterality: N/A;   WISDOM TOOTH EXTRACTION     Family History  Problem Relation Age of Onset   Emphysema Mother    Alzheimer's disease Father    Prostate cancer Father        dx in his 28s   Prostate cancer Brother        dx in his 66s   Breast cancer Maternal Grandmother        56s   Pancreatic cancer Maternal Grandfather        82s   Dementia Paternal Grandmother    Heart attack Paternal Grandfather    Colon cancer Neg Hx    Social History   Socioeconomic History   Marital status: Married    Spouse name: Not on file   Number of children: 1   Years of education: Not on file   Highest education level: Bachelor's degree (e.g., BA, AB, BS)  Occupational History    Comment: working full time  Tobacco Use   Smoking status: Former    Current packs/day: 0.00    Average packs/day: 1.5 packs/day for 35.0 years (52.5 ttl pk-yrs)    Types: Cigarettes    Start date: 01/11/1969    Quit date: 01/12/2004    Years since quitting: 19.5   Smokeless tobacco: Never   Tobacco comments:    OVER 30 YEARS HX OF SMOKING   Vaping Use    Vaping status: Never Used  Substance and Sexual Activity   Alcohol use: Not Currently    Comment: heavy drinker until 2004   Drug use: Not Currently    Types: Marijuana   Sexual activity: Not Currently  Other Topics Concern   Not on file  Social History Narrative   Married for 19 years.Works for [employer] from home.   Social Drivers of Corporate investment banker Strain: Low Risk  (08/24/2022)   Overall Financial Resource Strain (CARDIA)    Difficulty of Paying Living Expenses: Not very hard  Food Insecurity: No Food Insecurity (05/23/2023)   Hunger Vital Sign    Worried About Running Out of Food in the Last Year: Never true    Ran Out of Food in the Last Year: Never true  Transportation Needs: No Transportation Needs (05/23/2023)   PRAPARE - Administrator, Civil Service (Medical): No    Lack of Transportation (Non-Medical): No  Physical Activity: Insufficiently Active (08/24/2022)   Exercise Vital Sign    Days of Exercise per Week: 5 days    Minutes of Exercise per Session: 20 min  Stress: Stress Concern Present (08/24/2022)   [employer] of Occupational Health - Occupational Stress Questionnaire    Feeling of Stress : To some extent  Social Connections: Socially Isolated (08/24/2022)   Social Connection and Isolation Panel [NHANES]    Frequency of Communication with Friends and Family: Once a week    Frequency of Social Gatherings with Friends and Family: Once a week    Attends Religious Services: Never    Database administrator or Organizations: No    Attends Engineer, structural: Not on file    Marital Status: Married  Catering manager Violence: Not At Risk (05/23/2023)   Humiliation, Afraid, Rape, and Kick questionnaire    Fear of Current or Ex-Partner: No    Emotionally Abused: No    Physically  Abused: No    Sexually Abused: No    Review of Systems Constitutional: Patient reports some intentional weight gain lntegumentary: Patient denies  any rashes or pruritus Cardiovascular: Patient denies chest pain or syncope Respiratory: Patient denies shortness of breath Gastrointestinal: As per HPI Hematologic/Lymphatic: Patient denies bleeding tendencies Endocrine: Patient reports cold intolerance  GU: As per HPI.  OBJECTIVE Vitals:   07/11/23 1525  BP: (!) 144/81  Pulse: (!) 47   There is no height or weight on file to calculate BMI.  Physical Examination Constitutional: No obvious distress; patient is non-toxic appearing  Cardiovascular: No visible lower extremity edema.  Respiratory: The patient does not have audible wheezing/stridor; respirations do not appear labored  Gastrointestinal: Abdomen non-distended Musculoskeletal: Normal ROM of UEs  Skin: No obvious rashes/open sores  Neurologic: CN 2-12 grossly intact Psychiatric: Answered questions appropriately with normal affect  Hematologic/Lymphatic/Immunologic: No obvious bruises or sites of spontaneous bleeding  Urine microscopy: unremarkable PVR: 10 ml  ASSESSMENT Prostate cancer metastatic to multiple sites (HCC) - Plan: Urinalysis, Routine w reflex microscopic, BLADDER SCAN AMB NON-IMAGING  Malignant neoplasm of prostate metastatic to bone (HCC) - Plan: Urinalysis, Routine w reflex microscopic, BLADDER SCAN AMB NON-IMAGING   He is doing fairly well today; his overall wellbeing has improved compared to prior. We agreed to plan for follow up in 8 weeks for ongoing PVR surveillance due to his high risk for developing incomplete bladder emptying / urinary retention. We reviewed option to contact Urology office at any point if he elects to move forward with SP tube placement so that referral can be placed, however his PVR remains very good at this time so OK to continue surveillance. We reviewed the importance of preventing / managing constipation for both urination and pain management. Patient and wife verbalized understanding of and agreement with current plan. All  questions were answered.  PLAN Advised the following: 1. Return in about 8 weeks (around 09/05/2023) for UA, PVR, & f/u with Griselda Lederer NP.  Orders Placed This Encounter  Procedures   Urinalysis, Routine w reflex microscopic   BLADDER SCAN AMB NON-IMAGING    It has been explained that the patient is to follow regularly with their PCP in addition to all other providers involved in their care and to follow instructions provided by these respective offices. Patient advised to contact urology clinic if any urologic-pertaining questions, concerns, new symptoms or problems arise in the interim period.  There are no Patient Instructions on file for this visit.  Electronically signed by:  Lauretta Ponto, FNP   07/11/23    4:03 PM

## 2023-07-11 NOTE — Progress Notes (Signed)
 [facility] CSW Progress Note  Clinical Child psychotherapist contacted pt and spouse by phone to check in.  Both report that pt is continuing to do well.  Pt has gained weight and has the energy to physically do a bit more than he has been able to do for a while.  Pt is discontinuing his steroids which pt's wife is hoping will help w/ pt's bursts of anger.  Overall pt is stable w/ no significant issues.      Quenton Bruns, LCSW Clinical Social Worker [facility]

## 2023-07-12 ENCOUNTER — Other Ambulatory Visit: Payer: Self-pay

## 2023-07-12 LAB — MICROSCOPIC EXAMINATION: Bacteria, UA: NONE SEEN

## 2023-07-12 LAB — URINALYSIS, ROUTINE W REFLEX MICROSCOPIC
Bilirubin, UA: NEGATIVE
Glucose, UA: NEGATIVE
Ketones, UA: NEGATIVE
Nitrite, UA: NEGATIVE
Protein,UA: NEGATIVE
Specific Gravity, UA: 1.02 (ref 1.005–1.030)
Urobilinogen, Ur: 0.2 mg/dL (ref 0.2–1.0)
pH, UA: 7 (ref 5.0–7.5)

## 2023-07-18 ENCOUNTER — Ambulatory Visit: Admitting: Urology

## 2023-07-18 ENCOUNTER — Inpatient Hospital Stay: Attending: Hematology | Admitting: Licensed Clinical Social Worker

## 2023-07-18 DIAGNOSIS — C61 Malignant neoplasm of prostate: Secondary | ICD-10-CM

## 2023-07-18 NOTE — Progress Notes (Signed)
 [facility] CSW Progress Note  Clinical Child psychotherapist  contacted pt and spouse by phone to check in.  Pt reportedly tapered off steroid this week and has seen an improvement in mood as well as a significant dip in energy.  LTD was approved this week which has been the source of a great deal of stress.  Overall stable at present.  CSW to check on pt in 2 weeks.        Quenton Bruns, LCSW Clinical Social Worker [facility]

## 2023-07-25 ENCOUNTER — Encounter: Payer: Self-pay | Admitting: Hematology

## 2023-07-26 ENCOUNTER — Other Ambulatory Visit: Payer: Self-pay

## 2023-07-29 ENCOUNTER — Encounter: Payer: Self-pay | Admitting: Hematology

## 2023-07-29 MED ORDER — DUTASTERIDE 0.5 MG PO CAPS: 0.5000 mg | ORAL_CAPSULE | Freq: Every day | ORAL | 0 refills | Status: AC

## 2023-07-30 ENCOUNTER — Encounter: Payer: Self-pay | Admitting: Hematology

## 2023-07-30 ENCOUNTER — Encounter (HOSPITAL_COMMUNITY): Payer: Self-pay

## 2023-07-30 ENCOUNTER — Other Ambulatory Visit: Payer: Self-pay

## 2023-07-30 ENCOUNTER — Emergency Department (HOSPITAL_COMMUNITY)
Admission: EM | Admit: 2023-07-30 | Discharge: 2023-07-30 | Disposition: A | Discharge status: Home / Self Care | Payer: PRIVATE HEALTH INSURANCE | Attending: Emergency Medicine | Admitting: Emergency Medicine

## 2023-07-30 ENCOUNTER — Telehealth: Payer: Self-pay

## 2023-07-30 ENCOUNTER — Other Ambulatory Visit: Payer: Self-pay | Admitting: Urology

## 2023-07-30 DIAGNOSIS — R39198 Other difficulties with micturition: Secondary | ICD-10-CM | POA: Diagnosis present

## 2023-07-30 DIAGNOSIS — C7919 Secondary malignant neoplasm of other urinary organs: Secondary | ICD-10-CM | POA: Diagnosis not present

## 2023-07-30 DIAGNOSIS — C61 Malignant neoplasm of prostate: Secondary | ICD-10-CM | POA: Diagnosis not present

## 2023-07-30 DIAGNOSIS — C799 Secondary malignant neoplasm of unspecified site: Secondary | ICD-10-CM

## 2023-07-30 LAB — CBC WITH DIFFERENTIAL/PLATELET
Abs Immature Granulocytes: 0.19 K/uL — ABNORMAL HIGH (ref 0.00–0.07)
Basophils Absolute: 0 K/uL (ref 0.0–0.1)
Basophils Relative: 1 %
Eosinophils Absolute: 0.1 K/uL (ref 0.0–0.5)
Eosinophils Relative: 2 %
HCT: 25 % — ABNORMAL LOW (ref 39.0–52.0)
Hemoglobin: 8.3 g/dL — ABNORMAL LOW (ref 13.0–17.0)
Immature Granulocytes: 4 %
Lymphocytes Relative: 20 %
Lymphs Abs: 0.9 K/uL (ref 0.7–4.0)
MCH: 31.4 pg (ref 26.0–34.0)
MCHC: 33.2 g/dL (ref 30.0–36.0)
MCV: 94.7 fL (ref 80.0–100.0)
Monocytes Absolute: 0.6 K/uL (ref 0.1–1.0)
Monocytes Relative: 13 %
Neutro Abs: 2.6 K/uL (ref 1.7–7.7)
Neutrophils Relative %: 60 %
Platelets: 250 K/uL (ref 150–400)
RBC: 2.64 MIL/uL — ABNORMAL LOW (ref 4.22–5.81)
RDW: 15.3 % (ref 11.5–15.5)
WBC: 4.4 K/uL (ref 4.0–10.5)
nRBC: 0 % (ref 0.0–0.2)

## 2023-07-30 LAB — URINALYSIS, W/ REFLEX TO CULTURE (INFECTION SUSPECTED)
Bacteria, UA: NONE SEEN
Bilirubin Urine: NEGATIVE
Glucose, UA: NEGATIVE mg/dL
Hgb urine dipstick: NEGATIVE
Ketones, ur: 5 mg/dL — AB
Leukocytes,Ua: NEGATIVE
Nitrite: NEGATIVE
Protein, ur: NEGATIVE mg/dL
Specific Gravity, Urine: 1.023 (ref 1.005–1.030)
pH: 5 (ref 5.0–8.0)

## 2023-07-30 LAB — COMPREHENSIVE METABOLIC PANEL WITH GFR
ALT: 9 U/L (ref 0–44)
AST: 15 U/L (ref 15–41)
Albumin: 2.5 g/dL — ABNORMAL LOW (ref 3.5–5.0)
Alkaline Phosphatase: 320 U/L — ABNORMAL HIGH (ref 38–126)
Anion gap: 10 (ref 5–15)
BUN: 7 mg/dL — ABNORMAL LOW (ref 8–23)
CO2: 19 mmol/L — ABNORMAL LOW (ref 22–32)
Calcium: 7.3 mg/dL — ABNORMAL LOW (ref 8.9–10.3)
Chloride: 105 mmol/L (ref 98–111)
Creatinine, Ser: 0.42 mg/dL — ABNORMAL LOW (ref 0.61–1.24)
GFR, Estimated: >60 mL/min (ref >60)
Glucose, Bld: 109 mg/dL — ABNORMAL HIGH (ref 70–99)
Potassium: 3.3 mmol/L — ABNORMAL LOW (ref 3.5–5.1)
Sodium: 134 mmol/L — ABNORMAL LOW (ref 135–145)
Total Bilirubin: 1 mg/dL (ref 0.0–1.2)
Total Protein: 5.1 g/dL — ABNORMAL LOW (ref 6.5–8.1)

## 2023-07-30 MED ORDER — TAMSULOSIN HCL 0.4 MG PO CAPS: 0.4000 mg | ORAL_CAPSULE | Freq: Every day | ORAL | 11 refills | Status: DC

## 2023-07-30 NOTE — ED Provider Notes (Signed)
 [place] EMERGENCY DEPARTMENT AT [facility] Provider Note   CSN: [id number] Arrival date & time: 07/30/23  1243     History  Chief Complaint  Patient presents with   Urinary Retention    Scott [name] is a 69 y.o. male.  HPI   Patient has a history of hyperlipidemia metastatic prostate cancer, bladder outlet obstruction, acid reflux.  Patient has been followed by urology.  He has metastatic cancer that has metastasized into the urethra and external sphincter.  Patient had a cystoscopy in February.  They were unable to pass the scope into the bladder.  At that time plan was for possible urethral dilatation, tumor resection versus suprapubic tube placement.  Family was not interested in aggressive care and patient has currently been under palliative care.  Patient started having difficulty with urinating last night.  He was only able to urinate a little bit at a time but has been feeling progressive trouble with some urinary retention.  While in the ED was able to urinate some and currently is not having any significant bladder discomfort.  Patient called urologist and was told to come to the ED to have possible suprapubic tube placed  Home Medications Prior to Admission medications   Medication Sig Start Date End Date Taking? Authorizing Provider  tamsulosin (FLOMAX) 0.4 MG CAPS capsule Take 1 capsule (0.4 mg total) by mouth daily. 07/30/23   Lauretta Ponto, FNP  Cholecalciferol  125 MCG (5000 UT) capsule Take 10,000 Units by mouth daily.    [provider]  COMPRO  25 MG suppository Place 25 mg rectally every 8 (eight) hours as needed for nausea or vomiting. 02/27/23   [provider]  dexamethasone  (DECADRON ) 4 MG tablet Take 1 tablet (4 mg total) by mouth daily. 05/13/23   Paulett Boros, MD  docusate sodium  (COLACE) 100 MG capsule Take 100 mg by mouth daily.    [provider]  dronabinol  (MARINOL ) 5 MG capsule Take 1 capsule (5 mg total) by  mouth 2 (two) times daily before lunch and supper. 01/03/23   Paulett Boros, MD  dutasteride  (AVODART ) 0.5 MG capsule Take 1 capsule (0.5 mg total) by mouth daily for 15 days. 07/29/23 08/13/23  Paulett Boros, MD  Elastic Bandages & Supports (B-4 MED COMPRESSION HOSE MENS) MISC Wear as needed when awake to help prevent leg swelling 12/04/22   Idol, Concha Deed, PA-C  estradiol  (VIVELLE -DOT) 0.1 MG/24HR patch APPLY 1 PATCH(0.1 MG) TOPICALLY TO THE SKIN 2 TIMES A WEEK 10/19/22   Paulett Boros, MD  fentaNYL  (DURAGESIC ) 25 MCG/HR Place 1 patch onto the skin every 3 (three) days. 05/14/23   Paulett Boros, MD  MAGNESIUM  PO Take 1 tablet by mouth at bedtime.    [provider]  megestrol  (MEGACE ) 40 MG/ML suspension Take 10 mLs (400 mg total) by mouth 2 (two) times daily. 01/03/23   Paulett Boros, MD  MELATONIN PO Take 40 mg by mouth at bedtime.    [provider]  Multiple Vitamin (MULTIVITAMIN WITH MINERALS) TABS tablet Take 1 tablet by mouth daily.    [provider]  NATTOKINASE PO Take 2 capsules by mouth daily.    [provider]  NP THYROID  120 MG tablet Take 0.5 tablets (60 mg total) by mouth every morning. 09/02/22   Arcadio Knuckles, MD  ondansetron  (ZOFRAN  ODT) 8 MG disintegrating tablet Take 1 tablet (8 mg total) by mouth every 8 (eight) hours as needed for nausea or vomiting. 01/30/23   Paulett Boros,  MD  Oxycodone  HCl 10 MG TABS Take 1 tablet (10 mg total) by mouth every 6 (six) hours as needed. 05/01/23   Paulett Boros, MD  phenazopyridine  (PYRIDIUM ) 200 MG tablet Take 1 tablet (200 mg total) by mouth 3 (three) times daily. 01/14/23   Larocco, Sarah C, FNP  prochlorperazine  (COMPAZINE ) 10 MG tablet Take 1 tablet (10 mg total) by mouth every 6 (six) hours as needed for nausea or vomiting. 01/30/23   Paulett Boros, MD  pyridOXINE (VITAMIN B6) 25 MG tablet Take 25 mg by mouth daily.    [provider]  thiamine  (VITAMIN B-1) 50 MG tablet Take 1 tablet (50 mg total) by mouth daily. 08/27/22   Arcadio Knuckles, MD  traMADol  (ULTRAM ) 50 MG tablet Take 2 tablets (100 mg total) by mouth every 6 (six) hours as needed. 12/17/22   Paulett Boros, MD  vitamin E 180 MG (400 UNITS) capsule Take 400 Units by mouth daily.    [provider]  VITAMIN K PO Take 1 capsule by mouth daily.    [provider]      Allergies    Crestor  [rosuvastatin ], Ivp dye [iodinated contrast media], and Wellbutrin [bupropion]    Review of Systems   Review of Systems  Physical Exam Updated Vital Signs BP (!) 147/71 (BP Location: Right Arm)   Pulse 65   Temp 98.3 F (36.8 C) (Oral)   Resp 15   Ht 1.676 m (5\' 6" )   Wt 63 kg   SpO2 99%   BMI 22.44 kg/m  Physical Exam Vitals and nursing note reviewed.  Constitutional:      General: He is not in acute distress.    Appearance: He is well-developed.  HENT:     Head: Normocephalic and atraumatic.     Right Ear: External ear normal.     Left Ear: External ear normal.  Eyes:     General: No scleral icterus.       Right eye: No discharge.        Left eye: No discharge.     Conjunctiva/sclera: Conjunctivae normal.  Neck:     Trachea: No tracheal deviation.  Cardiovascular:     Rate and Rhythm: Normal rate.  Pulmonary:     Effort: Pulmonary effort is normal. No respiratory distress.     Breath sounds: No stridor.  Abdominal:     General: There is no distension.     Tenderness: There is no abdominal tenderness.  Musculoskeletal:        General: No swelling or deformity.     Cervical back: Neck supple.  Skin:    General: Skin is warm and dry.     Findings: No rash.  Neurological:     Mental Status: He is alert. Mental status is at baseline.     Cranial Nerves: No dysarthria or facial asymmetry.     Motor: No seizure activity.     ED Results / Procedures / Treatments   Labs (all labs ordered are listed, but only abnormal results are  displayed) Labs Reviewed  URINALYSIS, W/ REFLEX TO CULTURE (INFECTION SUSPECTED) - Abnormal; Notable for the following components:      Result Value   Ketones, ur 5 (*)    All other components within normal limits  COMPREHENSIVE METABOLIC PANEL WITH GFR - Abnormal; Notable for the following components:   Sodium 134 (*)    Potassium 3.3 (*)    CO2 19 (*)    Glucose, Bld  109 (*)    BUN 7 (*)    Creatinine, Ser 0.42 (*)    Calcium  7.3 (*)    Total Protein 5.1 (*)    Albumin 2.5 (*)    Alkaline Phosphatase 320 (*)    All other components within normal limits  CBC WITH DIFFERENTIAL/PLATELET - Abnormal; Notable for the following components:   RBC 2.64 (*)    Hemoglobin 8.3 (*)    HCT 25.0 (*)    Abs Immature Granulocytes 0.19 (*)    All other components within normal limits    EKG None  Radiology No results found.  Procedures Procedures    Medications Ordered in ED Medications - No data to display  ED Course/ Medical Decision Making/ A&P Clinical Course as of 07/30/23 1853  Tue Jul 30, 2023  1654 Bladder scan shows 62 cc after urination [JK]  1734 D/w Dr Secundino Dach.  No indication for suprapubic catheter placed emergently as pt is not in urinary retention.  Will need to follow up with urology as outpatient to arrange for possible suprapubic catheter [JK]  1827 Comprehensive metabolic panel(!) Normal renal function  [JK]  1827 Urinalysis, w/ Reflex to Culture (Infection Suspected) -Urine, Clean Catch(!) [JK]  1827 Urinalysis, w/ Reflex to Culture (Infection Suspected) -Urine, Clean Catch(!) Not suggestive of infection [JK]    Clinical Course User Index [JK] Trish Furl, MD                                 Medical Decision Making Problems Addressed: Metastatic malignant neoplasm, unspecified site [facility]): chronic illness or injury with exacerbation, progression, or side effects of treatment  Amount and/or Complexity of Data Reviewed Labs: ordered. Decision-making details  documented in ED Course.   Patient presents ED with concerns of urinary retention related to his known metastatic prostate cancer.  Patient is currently on hospice care.  Patient has had metastases to the urethra.  Previously in February he had a ureteroscopy.  Scope could not pass into the bladder.  Discussion at that time did include the possibility of a suprapubic catheter.  Patient was concerned that he was retaining again.  In the ED he has been able to urinate.  There is no evidence of urinary retention at this time on his bladder scan.  Urinalysis not suggestive of infection.  His renal function was normal.  I discussed case with Dr. Secundino Dach urology.  No need for emergent suprapubic catheter at this time.  Recommendation for close follow-up with his urologist to arrange for possible outpatient catheter.        Final Clinical Impression(s) / ED Diagnoses Final diagnoses:  Metastatic malignant neoplasm, unspecified site [facility])    Rx / DC Orders ED Discharge Orders     None         Trish Furl, MD 07/30/23 [phone]

## 2023-07-30 NOTE — Telephone Encounter (Signed)
 Please see patient triage call below and advise, scheduled follow up 06/23

## 2023-07-30 NOTE — ED Notes (Addendum)
 Pt has PORT and pt requested to have in accessed for his blood work, and refused straight stick.

## 2023-07-30 NOTE — Telephone Encounter (Signed)
 Patient is currently at the hospital.  The wife mentioned moving forward with the SP tube.  She thought the the MD at the ER would put that order in.  I was not sure if they would so I told her I would let you know they were being seen at [facility] and if needed you would put a order in for IR.

## 2023-07-30 NOTE — Discharge Instructions (Addendum)
 Follow-up with urologist to discuss arrangement of possible suprapubic catheter.  Return to the emergency room for recurrent symptoms.

## 2023-07-30 NOTE — ED Triage Notes (Signed)
 Pt to er, pt states that he has a hx of prostate cancer and he is having some concerns for a blockage.  Pt states that he can still urinate a little bit, states that it was clogged overnight, but has been getting consistently worse

## 2023-07-30 NOTE — ED Provider Triage Note (Signed)
 Emergency Medicine Provider Triage Evaluation Note  Scott [name] , a 68 y.o. male  was evaluated in triage.  Pt complains of urinary retention.  Review of Systems  Positive:  Negative:   Physical Exam  BP 124/76 (BP Location: Left Arm)   Pulse 78   Temp 97.7 F (36.5 C) (Oral)   Resp 18   Ht 5\' 6"  (1.676 m)   Wt 63 kg   SpO2 100%   BMI 22.44 kg/m  Gen:   Awake, no distress   Resp:  Normal effort  MSK:   Moves extremities without difficulty  Other:    Medical Decision Making  Medically screening exam initiated at 1:35 PM.  Appropriate orders placed.  [name] was informed that the remainder of the evaluation will be completed by another provider, this initial triage assessment does not replace that evaluation, and the importance of remaining in the ED until their evaluation is complete.  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 Patient weak stream progressing over the past week. 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 Family member stating that this would be treated with urostomy. Patient denies increasing pain in his bladder.    [place], [place] 07/30/23 1338

## 2023-07-31 ENCOUNTER — Telehealth: Payer: Self-pay

## 2023-07-31 ENCOUNTER — Encounter: Payer: Self-pay | Admitting: Urology

## 2023-07-31 ENCOUNTER — Other Ambulatory Visit: Payer: Self-pay | Admitting: Urology

## 2023-07-31 DIAGNOSIS — C61 Malignant neoplasm of prostate: Secondary | ICD-10-CM

## 2023-07-31 DIAGNOSIS — R3912 Poor urinary stream: Secondary | ICD-10-CM

## 2023-07-31 DIAGNOSIS — Z9189 Other specified personal risk factors, not elsewhere classified: Secondary | ICD-10-CM

## 2023-07-31 NOTE — Telephone Encounter (Signed)
 Called IR at [facility], spoke with Odilia Bennett. Odilia Bennett state's she do not scheduled but she will send a message to the scheduler and someone from IR will call the patient to get SP tube placement  scheduled and expedite if possible.  I placed the order, but if someone could call IR to try to expedite the tube placement I would be appreciative.

## 2023-07-31 NOTE — Telephone Encounter (Signed)
 Return call to patient's wife state's that patient went to ED for urine obstruction and SP tube placement. Patient's wife would like for someone to contacted her about the different options such as urostomy or sp tube placement. Wife is requesting that the patient has SP tube placement done by Friday. Intervention Radiology order need placement. Wife is aware a message will be sent to the provider.

## 2023-08-01 ENCOUNTER — Telehealth: Payer: Self-pay

## 2023-08-01 ENCOUNTER — Inpatient Hospital Stay: Admitting: Licensed Clinical Social Worker

## 2023-08-01 ENCOUNTER — Other Ambulatory Visit: Payer: Self-pay | Admitting: Urology

## 2023-08-01 DIAGNOSIS — C61 Malignant neoplasm of prostate: Secondary | ICD-10-CM

## 2023-08-01 DIAGNOSIS — Z87448 Personal history of other diseases of urinary system: Secondary | ICD-10-CM

## 2023-08-01 DIAGNOSIS — R3912 Poor urinary stream: Secondary | ICD-10-CM

## 2023-08-01 NOTE — Progress Notes (Signed)
 His wife is curious as to whether he would be better served with nephrostomies since he has had ureteral obstruction in the past.  His recent Cr was 0.42 and he had no flank pain.  His PVR in the ER was 61ml.  I will order a renal US  to assess the upper tracts and if he has no hydro, they could proceed with the SP tube to help with his quality of life since he is having to void so frequently.

## 2023-08-01 NOTE — Telephone Encounter (Signed)
  I have placed a stat order for a renal US .   His wife is going to see if that will be covered under hospice or if they will need to take him off temporarily.

## 2023-08-01 NOTE — Progress Notes (Signed)
 [facility] CSW Progress Note  Clinical Child psychotherapist contacted pt's spouse by phone to check in.  Pt has had a difficult week which resulted in a trip to the ED and a probable intervention to address urinary issues.  Pt and spouse are appropriately overwhelmed, but have been supported by both hospice and the physicians office for guidance through decision making.  Pt's spouse is hopeful pt will undergo a procedure and will be stabilized well before the Memorial Day weekend.  CSW to check in w/ pt/spouse next week.       Quenton Bruns, LCSW Clinical Social Worker [facility]

## 2023-08-01 NOTE — Telephone Encounter (Signed)
 Return call patient's wife and she want another intervention, wife state's she would like pt have nephrostomy tube placed instead of having patient having an SP tube placed. Patient's wife would like Dr Inga Manges to call her to discuss options. Please contact patient's wife Scott [name] [phone]. Patient's wife is an aware a message will be sent to MD for advisement.

## 2023-08-02 ENCOUNTER — Ambulatory Visit (HOSPITAL_BASED_OUTPATIENT_CLINIC_OR_DEPARTMENT_OTHER)
Admission: RE | Admit: 2023-08-02 | Discharge: 2023-08-02 | Disposition: A | Discharge status: Home / Self Care | Source: Ambulatory Visit | Attending: Urology | Admitting: Urology

## 2023-08-02 ENCOUNTER — Other Ambulatory Visit: Payer: Self-pay

## 2023-08-02 ENCOUNTER — Ambulatory Visit: Payer: Self-pay

## 2023-08-02 DIAGNOSIS — R3912 Poor urinary stream: Secondary | ICD-10-CM | POA: Diagnosis present

## 2023-08-02 DIAGNOSIS — C61 Malignant neoplasm of prostate: Secondary | ICD-10-CM | POA: Insufficient documentation

## 2023-08-02 DIAGNOSIS — Z87448 Personal history of other diseases of urinary system: Secondary | ICD-10-CM | POA: Diagnosis present

## 2023-08-02 MED ORDER — FENTANYL 75 MCG/HR TD PT72: 1.0000 | MEDICATED_PATCH | TRANSDERMAL | 0 refills | Status: DC

## 2023-08-02 NOTE — Telephone Encounter (Signed)
 I left a voicemail informing wife I would send results via mychart.  I informed her to call back or send a message via mychart with any questions or concerns.  Office hours provided along with office number.

## 2023-08-02 NOTE — Telephone Encounter (Signed)
 Patient would like to proceed with SP tube.

## 2023-08-02 NOTE — Telephone Encounter (Signed)
-----   Message from Homero Luster sent at 08/02/2023  3:13 PM EDT ----- He has no hydronephrosis so I don't think nephrostomy tubes are indicated.   His bladder is reported as mildly distended so if he wants to proceed with the suprapubic tube, that would be ok. ----- Message ----- From: Dorma Gash, CMA Sent: 08/02/2023   2:45 PM EDT To: Homero Luster, MD  Please review.

## 2023-08-06 ENCOUNTER — Other Ambulatory Visit: Payer: Self-pay

## 2023-08-06 ENCOUNTER — Emergency Department (HOSPITAL_COMMUNITY)
Admission: EM | Admit: 2023-08-06 | Discharge: 2023-08-06 | Disposition: A | Discharge status: Home / Self Care | Attending: Emergency Medicine | Admitting: Emergency Medicine

## 2023-08-06 ENCOUNTER — Emergency Department (HOSPITAL_COMMUNITY)

## 2023-08-06 ENCOUNTER — Inpatient Hospital Stay: Admitting: Licensed Clinical Social Worker

## 2023-08-06 DIAGNOSIS — Z8546 Personal history of malignant neoplasm of prostate: Secondary | ICD-10-CM | POA: Insufficient documentation

## 2023-08-06 DIAGNOSIS — D649 Anemia, unspecified: Secondary | ICD-10-CM

## 2023-08-06 DIAGNOSIS — R109 Unspecified abdominal pain: Secondary | ICD-10-CM | POA: Diagnosis not present

## 2023-08-06 DIAGNOSIS — Z87891 Personal history of nicotine dependence: Secondary | ICD-10-CM | POA: Diagnosis not present

## 2023-08-06 DIAGNOSIS — E871 Hypo-osmolality and hyponatremia: Secondary | ICD-10-CM

## 2023-08-06 DIAGNOSIS — C61 Malignant neoplasm of prostate: Secondary | ICD-10-CM | POA: Insufficient documentation

## 2023-08-06 DIAGNOSIS — R339 Retention of urine, unspecified: Secondary | ICD-10-CM | POA: Insufficient documentation

## 2023-08-06 HISTORY — PX: IR CYSTOSTOMY TUBE PLACEMENT/BLADDER ASPIRATION: IMG1097

## 2023-08-06 LAB — CBC WITH DIFFERENTIAL/PLATELET
Abs Immature Granulocytes: 0.09 10*3/uL — ABNORMAL HIGH (ref 0.00–0.07)
Basophils Absolute: 0.1 10*3/uL (ref 0.0–0.1)
Basophils Relative: 1 %
Eosinophils Absolute: 0.1 10*3/uL (ref 0.0–0.5)
Eosinophils Relative: 1 %
HCT: 26.3 % — ABNORMAL LOW (ref 39.0–52.0)
Hemoglobin: 8.8 g/dL — ABNORMAL LOW (ref 13.0–17.0)
Immature Granulocytes: 2 %
Lymphocytes Relative: 14 %
Lymphs Abs: 0.7 10*3/uL (ref 0.7–4.0)
MCH: 31.7 pg (ref 26.0–34.0)
MCHC: 33.5 g/dL (ref 30.0–36.0)
MCV: 94.6 fL (ref 80.0–100.0)
Monocytes Absolute: 0.8 10*3/uL (ref 0.1–1.0)
Monocytes Relative: 15 %
Neutro Abs: 3.6 10*3/uL (ref 1.7–7.7)
Neutrophils Relative %: 67 %
Platelets: 277 10*3/uL (ref 150–400)
RBC: 2.78 MIL/uL — ABNORMAL LOW (ref 4.22–5.81)
RDW: 15.2 % (ref 11.5–15.5)
WBC: 5.3 10*3/uL (ref 4.0–10.5)
nRBC: 0 % (ref 0.0–0.2)

## 2023-08-06 LAB — BASIC METABOLIC PANEL WITH GFR
Anion gap: 9 (ref 5–15)
BUN: 10 mg/dL (ref 8–23)
CO2: 22 mmol/L (ref 22–32)
Calcium: 8.9 mg/dL (ref 8.9–10.3)
Chloride: 99 mmol/L (ref 98–111)
Creatinine, Ser: 0.61 mg/dL (ref 0.61–1.24)
GFR, Estimated: >60 mL/min (ref >60)
Glucose, Bld: 129 mg/dL — ABNORMAL HIGH (ref 70–99)
Potassium: 3.6 mmol/L (ref 3.5–5.1)
Sodium: 130 mmol/L — ABNORMAL LOW (ref 135–145)

## 2023-08-06 LAB — PROTIME-INR
INR: 1.2 (ref 0.8–1.2)
Prothrombin Time: 15.1 s (ref 11.4–15.2)

## 2023-08-06 MED ORDER — CEFAZOLIN SODIUM-DEXTROSE 2-4 GM/100ML-% IV SOLN
2.0000 g | Freq: Once | INTRAVENOUS | Status: AC
  Administered 2023-08-06: 2 g via INTRAVENOUS

## 2023-08-06 MED ORDER — IOHEXOL 300 MG/ML  SOLN
50.0000 mL | Freq: Once | INTRAMUSCULAR | Status: AC | PRN
  Administered 2023-08-06: 7 mL

## 2023-08-06 MED ORDER — FENTANYL CITRATE PF 50 MCG/ML IJ SOSY
50.0000 ug | PREFILLED_SYRINGE | INTRAMUSCULAR | Status: DC | PRN
  Administered 2023-08-06: 50 ug via INTRAVENOUS
  Filled 2023-08-06: qty 1

## 2023-08-06 MED ORDER — FENTANYL CITRATE (PF) 100 MCG/2ML IJ SOLN
INTRAMUSCULAR | Status: AC
Start: 2023-08-06 — End: ?
  Filled 2023-08-06: qty 4

## 2023-08-06 MED ORDER — MIDAZOLAM HCL 2 MG/2ML IJ SOLN
INTRAMUSCULAR | Status: AC
  Filled 2023-08-06: qty 6

## 2023-08-06 MED ORDER — LIDOCAINE-EPINEPHRINE 1 %-1:100000 IJ SOLN
INTRAMUSCULAR | Status: AC
  Filled 2023-08-06: qty 1

## 2023-08-06 MED ORDER — DIPHENHYDRAMINE HCL 50 MG/ML IJ SOLN
INTRAMUSCULAR | Status: AC | PRN
  Administered 2023-08-06: 50 mg via INTRAVENOUS

## 2023-08-06 MED ORDER — MIDAZOLAM HCL 2 MG/2ML IJ SOLN
INTRAMUSCULAR | Status: AC | PRN
  Administered 2023-08-06: 1 mg via INTRAVENOUS
  Administered 2023-08-06: 2 mg via INTRAVENOUS
  Administered 2023-08-06: 1 mg via INTRAVENOUS

## 2023-08-06 MED ORDER — LIDOCAINE-EPINEPHRINE 1 %-1:100000 IJ SOLN
20.0000 mL | Freq: Once | INTRAMUSCULAR | Status: AC
  Administered 2023-08-06: 20 mL via INTRADERMAL

## 2023-08-06 MED ORDER — FENTANYL CITRATE (PF) 100 MCG/2ML IJ SOLN
INTRAMUSCULAR | Status: AC | PRN
  Administered 2023-08-06 (×2): 50 ug via INTRAVENOUS

## 2023-08-06 MED ORDER — HEPARIN SOD (PORK) LOCK FLUSH 100 UNIT/ML IV SOLN
500.0000 [IU] | Freq: Once | INTRAVENOUS | Status: AC
  Administered 2023-08-06: 500 [IU]
  Filled 2023-08-06: qty 5

## 2023-08-06 MED ORDER — CEFAZOLIN SODIUM-DEXTROSE 2-4 GM/100ML-% IV SOLN
INTRAVENOUS | Status: AC
  Filled 2023-08-06: qty 100

## 2023-08-06 MED ORDER — LIDOCAINE VISCOUS HCL 2 % MT SOLN
OROMUCOSAL | Status: AC
  Filled 2023-08-06: qty 15

## 2023-08-06 MED ORDER — DIPHENHYDRAMINE HCL 50 MG/ML IJ SOLN
INTRAMUSCULAR | Status: AC
  Filled 2023-08-06: qty 1

## 2023-08-06 NOTE — Procedures (Signed)
 Interventional Radiology Procedure Note  Procedure:  Image guided suprapubic catheter placement  Findings: Please refer to procedural dictation for full description. 16 Fr council placed, to bag drainage.  Complications: None immediate  Estimated Blood Loss: < 5 ml  Recommendations: Follow up as per Urology.   Creasie Doctor, MD

## 2023-08-06 NOTE — ED Provider Notes (Signed)
 68 yo male presenting with urinary retention Planning for suprapubic placement with IR team today Anticipating discharge after placement with IR today  Physical Exam  BP (!) 146/78 (BP Location: Left Arm)   Pulse 83   Temp 97.9 F (36.6 C) (Oral)   Resp 17   SpO2 100%   Physical Exam  Procedures  Procedures  ED Course / MDM   Clinical Course as of 08/06/23 1302  Tue Aug 06, 2023  0940 Back from IR with suprapubic in place - pt stable on room air, not needing Craig oxygen.  Sleeping - he was given versed  and fentanyl  at 0900 for his procedure [MT]  1301 Ambulated with walker which is baseline.  Pt feeling well, awake, stable for discharge [MT]    Clinical Course User Index [MT] Norlan Rann, Janalyn Me, MD   Medical Decision Making Amount and/or Complexity of Data Reviewed Labs: ordered. Radiology: ordered.  Risk Prescription drug management. Decision regarding hospitalization.          Arvilla Birmingham, MD 08/06/23 1302

## 2023-08-06 NOTE — ED Notes (Signed)
 Patient transported to IR

## 2023-08-06 NOTE — ED Provider Notes (Signed)
 [place] EMERGENCY DEPARTMENT AT [facility]  Provider Note   CSN: [id number] Arrival date & time: 08/06/23  2952     History Chief Complaint  Patient presents with   Urinary Retention    HPI Scott [name] is a 68 y.o. male presenting for chief complaint of urinary retention.  He is a 68 year old male with an expansive medical history.  Prostatic cancer follows with urology.  Has been having worsening urinary retention over the past 2 weeks.  Plan for outpatient suprapubic placement.  However last night at midnight he had sudden onset of complete urinary obstruction..   Patient's recorded medical, surgical, social, medication list and allergies were reviewed in the Snapshot window as part of the initial history.   Review of Systems   Review of Systems  Constitutional:  Negative for chills and fever.  HENT:  Negative for ear pain and sore throat.   Eyes:  Negative for pain and visual disturbance.  Respiratory:  Negative for cough and shortness of breath.   Cardiovascular:  Negative for chest pain and palpitations.  Gastrointestinal:  Negative for abdominal pain and vomiting.  Genitourinary:  Positive for difficulty urinating. Negative for dysuria and hematuria.  Musculoskeletal:  Negative for arthralgias and back pain.  Skin:  Negative for color change and rash.  Neurological:  Negative for seizures and syncope.  All other systems reviewed and are negative.   Physical Exam Updated Vital Signs BP (!) 143/85 (BP Location: Right Arm)   Pulse 91   Temp 98.3 F (36.8 C) (Oral)   Resp 16   SpO2 98%  Physical Exam Vitals and nursing note reviewed.  Constitutional:      General: He is not in acute distress.    Appearance: He is well-developed.  HENT:     Head: Normocephalic and atraumatic.  Eyes:     Conjunctiva/sclera: Conjunctivae normal.  Cardiovascular:     Rate and Rhythm: Normal rate and regular rhythm.     Heart sounds: No murmur heard. Pulmonary:      Effort: Pulmonary effort is normal. No respiratory distress.     Breath sounds: Normal breath sounds.  Abdominal:     General: There is distension.     Palpations: Abdomen is soft.     Tenderness: There is abdominal tenderness.  Musculoskeletal:        General: No swelling.     Cervical back: Neck supple.  Skin:    General: Skin is warm and dry.     Capillary Refill: Capillary refill takes less than 2 seconds.  Neurological:     Mental Status: He is alert.  Psychiatric:        Mood and Affect: Mood normal.      ED Course/ Medical Decision Making/ A&P    Procedures Ultrasound ED Renal  Date/Time: 08/06/2023 7:01 AM  Performed by: Onetha Bile, MD Authorized by: Onetha Bile, MD   Procedure details:    Indications: acute renal injury     Technique:  Bladder Bladder findings:    Bladder:  Visualized   Volume:  450    Medications Ordered in ED Medications  fentaNYL  (SUBLIMAZE ) injection 50 mcg (has no administration in time range)    Medical Decision Making:   Scott [name] is a 68 y.o. male who presented to the ED today with acute urinary retention detailed above.    Patient placed on continuous vitals and telemetry monitoring while in ED which was reviewed periodically.  Complete initial physical exam performed, notably  the patient  was hemodynamically stable no acute distress.    Reviewed and confirmed nursing documentation for past medical history, family history, social history.    Initial Assessment:   With the patient's presentation of acute urinary retention, most likely diagnosis is obstruction. This is most consistent with an acute life/limb threatening illness complicated by underlying chronic conditions.  Initial Plan:  Screening labs including CBC and Metabolic panel to evaluate for infectious or metabolic etiology of disease.  Urinalysis with reflex culture ordered to evaluate for UTI or relevant urologic/nephrologic pathology.   Objective  evaluation as below reviewed   Initial Study Results:   Laboratory  All laboratory results reviewed without evidence of clinically relevant pathology.    Reassessment and Plan:   Consulted urology who recommended interventional radiology placement of a suprapubic catheter.  Discussed with on-call radiology who stated that they would place suprapubic this morning at 8 AM.  Patient is n.p.o., not on any anticoagulation.  Anticipate suprapubic placement this morning and discharge following procedure.    Clinical Impression:  1. Urinary retention      Data Unavailable   Final Clinical Impression(s) / ED Diagnoses Final diagnoses:  Urinary retention    Rx / DC Orders ED Discharge Orders     None         Onetha Bile, MD 08/06/23 [phone]

## 2023-08-06 NOTE — ED Notes (Addendum)
 Pt ambulated with walker, O2 was 96 and pulse was 97.

## 2023-08-06 NOTE — Progress Notes (Signed)
 [facility] CSW Progress Note  Visual merchandiser spoke w/ pt's spouse by phone.  Per spouse pt was unable to wait to be scheduled for an SP cath to be placed and needed to go to the ED.  Pt has now returned home s/p placement of SP cath and is resting comfortably.  Spouse reports a stressful week waiting for the issue to be resolved, but is feeling much better having returned home w/ a solution.  CSW to check in w/ pt/spouse next week.       Quenton Bruns, LCSW Clinical Social Worker [facility]

## 2023-08-06 NOTE — ED Triage Notes (Signed)
 POV unable to urinate since midnight.   Hx of prostate cancer, scheduled for suprapubic catheter 05/29.   Alert on triage pain 7 out of 10

## 2023-08-06 NOTE — Consult Note (Addendum)
 Chief Complaint: Patient was seen in consultation today for active urinary retention refractory to urethral Foley catheter placement, with consideration for urgent suprapubic catheter placement.  Referring Provider(s): Dr. Homero Luster, MD   Supervising Physician: Creasie Doctor  Patient Status: [facility] - ED  Patient is Full Code  History of Present Illness: Scott [name] is a 68 y.o. male  with PMHx notable for HTN, HLD, metastatic prostate cancer with bladder obstruction, hypothyroidism, GERD, diverticulitis, and anemia. Patient does have a history of PONV/ anesthesia complications.   Patient presented to Ed this am with discomfort from urinary retention. Urology service was unable to advance a foley catheter. Interventional Radiology was requested for urgent suprapubic catheter placement. Request was reviewed and approved by Dr. Jinx Mourning. Patient is urgently scheduled for same in IR today.   Patient is alert and laying in bed, in discomfort. He is experiencing penile pain and bladder pain. Patient denies any fevers, headache, chest pain, SOB, cough, nausea, vomiting or bleeding.     Past Medical History:  Diagnosis Date   Anemia    Bladder obstruction    Complication of anesthesia    Diverticulitis 2015   Dyspnea    Essential hypertension, benign 01/26/2019   at doctor's office elevated, normal at home, no medications at this time   GERD (gastroesophageal reflux disease)    History of basal cell cancer    HLD (hyperlipidemia) 01/26/2019   Hypothyroidism    history of, not currently taking medication   PONV (postoperative nausea and vomiting)    Prostate cancer (HCC)    metastatic to bones   Vitamin D  deficiency disease 01/26/2019    Past Surgical History:  Procedure Laterality Date   CYSTOSCOPY W/ URETERAL STENT PLACEMENT Left 11/10/2019   Procedure: CYSTOSCOPY;  Surgeon: Homero Luster, MD;  Location: [facility];  Service: Urology;  Laterality: Left;    ESOPHAGOGASTRODUODENOSCOPY (EGD) WITH PROPOFOL  N/A 07/07/2020   Procedure: ESOPHAGOGASTRODUODENOSCOPY (EGD) WITH PROPOFOL ;  Surgeon: Suzette Espy, MD;  Location: [facility];  Service: Endoscopy;  Laterality: N/A;  pm appt   HERNIA REPAIR  1995   UMBILICAL    IR FLUORO GUIDED NEEDLE PLC ASPIRATION/INJECTION LOC  04/09/2022   IR IMAGING GUIDED PORT INSERTION  10/11/2022   MALONEY DILATION N/A 07/07/2020   Procedure: Londa Rival DILATION;  Surgeon: Suzette Espy, MD;  [facility];  Service: Endoscopy;  Laterality: N/A;   PROSTATE SURGERY     SKIN CANCER EXCISION     TONSILLECTOMY Bilateral    TOOTH EXTRACTION     front left tooth pulled but no surgery or stitches   TRANSURETHRAL RESECTION OF BLADDER TUMOR N/A 12/18/2022   Procedure: REMOVAL OF BLADDER STONES;  Surgeon: Homero Luster, MD;  Location: [facility];  Service: Urology;  Laterality: N/A;  1 HR FOR CASE   TRANSURETHRAL RESECTION OF PROSTATE N/A 01/25/2017   Procedure: TRANSURETHRAL RESECTION OF THE PROSTATE (TURP);  Surgeon: Osborn Blaze, MD;  Location: [facility];  Service: Urology;  Laterality: N/A;   TRANSURETHRAL RESECTION OF PROSTATE N/A 11/10/2019   Procedure: TRANSURETHRAL RESECTION OF THE PROSTATE (TURP);  Surgeon: Homero Luster, MD;  Location: [facility];  Service: Urology;  Laterality: N/A;   TRANSURETHRAL RESECTION OF PROSTATE N/A 12/18/2022   Procedure: TRANSURETHRAL RESECTION OF THE PROSTATE (TURP);  Surgeon: Homero Luster, MD;  Location: [facility];  Service: Urology;  Laterality: N/A;   WISDOM TOOTH EXTRACTION      Allergies: Crestor  [rosuvastatin ], Ivp dye [iodinated contrast media], and  Wellbutrin [bupropion]  Medications: Prior to Admission medications   Medication Sig Start Date End Date Taking? Authorizing Provider  Cholecalciferol  125 MCG (5000 UT) capsule Take 10,000 Units by mouth daily.   Yes [provider]  COMPRO  25 MG suppository Place 25 mg rectally every 8 (eight) hours as needed for nausea or vomiting.  02/27/23  Yes [provider]  docusate sodium  (COLACE) 100 MG capsule Take 100 mg by mouth daily.   Yes [provider]  dutasteride  (AVODART ) 0.5 MG capsule Take 1 capsule (0.5 mg total) by mouth daily for 15 days. 07/29/23 08/13/23 Yes Paulett Boros, MD  estradiol  (VIVELLE -DOT) 0.1 MG/24HR patch APPLY 1 PATCH(0.1 MG) TOPICALLY TO THE SKIN 2 TIMES A WEEK 10/19/22  Yes Paulett Boros, MD  fentaNYL  (DURAGESIC ) 75 MCG/HR Place 1 patch onto the skin every 3 (three) days. 08/02/23  Yes Paulett Boros, MD  MELATONIN PO Take 40 mg by mouth at bedtime.   Yes [provider]  NATTOKINASE PO Take 2 capsules by mouth daily.   Yes [provider]  NP THYROID  120 MG tablet Take 0.5 tablets (60 mg total) by mouth every morning. 09/02/22  Yes Arcadio Knuckles, MD  ondansetron  (ZOFRAN  ODT) 8 MG disintegrating tablet Take 1 tablet (8 mg total) by mouth every 8 (eight) hours as needed for nausea or vomiting. 01/30/23  Yes Paulett Boros, MD  Oxycodone  HCl 10 MG TABS Take 1 tablet (10 mg total) by mouth every 6 (six) hours as needed. 05/01/23  Yes Paulett Boros, MD  phenazopyridine  (PYRIDIUM ) 200 MG tablet Take 1 tablet (200 mg total) by mouth 3 (three) times daily. Patient taking differently: Take 200 mg by mouth 3 (three) times daily as needed for pain. 01/14/23  Yes Lauretta Ponto, FNP  prochlorperazine  (COMPAZINE ) 10 MG tablet Take 1 tablet (10 mg total) by mouth every 6 (six) hours as needed for nausea or vomiting. 01/30/23  Yes Paulett Boros, MD  tamsulosin  (FLOMAX ) 0.4 MG CAPS capsule Take 1 capsule (0.4 mg total) by mouth daily. Patient not taking: Reported on 08/06/2023 07/30/23   Lauretta Ponto, FNP  dexamethasone  (DECADRON ) 4 MG tablet Take 1 tablet (4 mg total) by mouth daily. Patient not taking: Reported on 08/06/2023 05/13/23   Paulett Boros, MD  dronabinol  (MARINOL ) 5 MG capsule Take 1 capsule (5 mg total) by mouth 2 (two) times  daily before lunch and supper. Patient not taking: Reported on 08/06/2023 01/03/23   Paulett Boros, MD  Elastic Bandages & Supports (B-4 MED COMPRESSION HOSE MENS) MISC Wear as needed when awake to help prevent leg swelling 12/04/22   Katherine Pancake, PA-C  MAGNESIUM  PO Take 1 tablet by mouth at bedtime. Patient not taking: Reported on 08/06/2023    [provider]  megestrol  (MEGACE ) 40 MG/ML suspension Take 10 mLs (400 mg total) by mouth 2 (two) times daily. Patient not taking: Reported on 08/06/2023 01/03/23   Katragadda, Sreedhar, MD  Multiple Vitamin (MULTIVITAMIN WITH MINERALS) TABS tablet Take 1 tablet by mouth daily.    [provider]  pyridOXINE (VITAMIN B6) 25 MG tablet Take 25 mg by mouth daily. Patient not taking: Reported on 08/06/2023    [provider]  thiamine (VITAMIN B-1) 50 MG tablet Take 1 tablet (50 mg total) by mouth daily. Patient not taking: Reported on 08/06/2023 08/27/22   Arcadio Knuckles, MD  traMADol  (ULTRAM ) 50 MG tablet Take 2 tablets (100 mg total) by mouth every 6 (six) hours as needed. 12/17/22  Katragadda, Sreedhar, MD  vitamin E 180 MG (400 UNITS) capsule Take 400 Units by mouth daily. Patient not taking: Reported on 08/06/2023    [provider]  VITAMIN K PO Take 1 capsule by mouth daily. Patient not taking: Reported on 08/06/2023    [provider]     Family History  Problem Relation Age of Onset   Emphysema Mother    Alzheimer's disease Father    Prostate cancer Father        dx in his 31s   Prostate cancer Brother        dx in his 84s   Breast cancer Maternal Grandmother        79s   Pancreatic cancer Maternal Grandfather        72s   Dementia Paternal Grandmother    Heart attack Paternal Grandfather    Colon cancer Neg Hx     Social History   Socioeconomic History   Marital status: Married    Spouse name: Not on file   Number of children: 1   Years of education: Not on file   Highest  education level: Bachelor's degree (e.g., BA, AB, BS)  Occupational History    Comment: working full time  Tobacco Use   Smoking status: Former    Current packs/day: 0.00    Average packs/day: 1.5 packs/day for 35.0 years (52.5 ttl pk-yrs)    Types: Cigarettes    Start date: 01/11/1969    Quit date: 01/12/2004    Years since quitting: 19.5   Smokeless tobacco: Never   Tobacco comments:    OVER 30 YEARS HX OF SMOKING   Vaping Use   Vaping status: Never Used  Substance and Sexual Activity   Alcohol use: Not Currently   Drug use: Not Currently    Types: Marijuana   Sexual activity: Not Currently  Other Topics Concern   Not on file  Social History Narrative   Married for 19 years.Works for [employer] from home.   Social Drivers of Corporate investment banker Strain: Low Risk  (08/24/2022)   Overall Financial Resource Strain (CARDIA)    Difficulty of Paying Living Expenses: Not very hard  Food Insecurity: No Food Insecurity (05/23/2023)   Hunger Vital Sign    Worried About Running Out of Food in the Last Year: Never true    Ran Out of Food in the Last Year: Never true  Transportation Needs: No Transportation Needs (05/23/2023)   PRAPARE - Administrator, Civil Service (Medical): No    Lack of Transportation (Non-Medical): No  Physical Activity: Insufficiently Active (08/24/2022)   Exercise Vital Sign    Days of Exercise per Week: 5 days    Minutes of Exercise per Session: 20 min  Stress: Stress Concern Present (08/24/2022)   [employer] of Occupational Health - Occupational Stress Questionnaire    Feeling of Stress : To some extent  Social Connections: Socially Isolated (08/24/2022)   Social Connection and Isolation Panel [NHANES]    Frequency of Communication with Friends and Family: Once a week    Frequency of Social Gatherings with Friends and Family: Once a week    Attends Religious Services: Never    Database administrator or Organizations: No     Attends Engineer, structural: Not on file    Marital Status: Married     Review of Systems: A 12 point ROS discussed and pertinent positives are indicated in the HPI above.  All other systems are negative.  Vital Signs: BP 135/81   Pulse 91   Temp 98.3 F (36.8 C) (Oral)   Resp 15   SpO2 96%   Advance Care Plan: The advanced care place/surrogate decision maker was discussed at the time of visit and the patient did not wish to discuss or was not able to name a surrogate decision maker or provide an advance care plan.  Physical Exam Vitals reviewed.  Constitutional:      General: He is in acute distress.     Appearance: Normal appearance.  HENT:     Mouth/Throat:     Mouth: Mucous membranes are dry.  Cardiovascular:     Rate and Rhythm: Normal rate and regular rhythm.     Pulses: Normal pulses.     Heart sounds: No murmur heard. Pulmonary:     Effort: Pulmonary effort is normal. No respiratory distress.     Breath sounds: Normal breath sounds.  Abdominal:     General: Abdomen is flat. There is distension.     Tenderness: There is abdominal tenderness.     Comments: Bladder distention noted.   Musculoskeletal:        General: Normal range of motion.     Cervical back: Normal range of motion.  Skin:    General: Skin is warm and dry.  Neurological:     Mental Status: He is alert and oriented to person, place, and time.  Psychiatric:        Mood and Affect: Mood normal.        Behavior: Behavior normal.        Thought Content: Thought content normal.        Judgment: Judgment normal.     Imaging: US  RENAL Result Date: 08/02/2023 CLINICAL DATA:  Prostate cancer. EXAM: RENAL / URINARY TRACT ULTRASOUND COMPLETE COMPARISON:  Renal ultrasound 12/11/2019.  PET-CT 05/09/2023 FINDINGS: Right Kidney: Renal measurements: 10.1 x 5.4 x 5.4 cm = volume: 153.9 mL. Echogenicity within normal limits. No mass or hydronephrosis visualized. Left Kidney: Renal measurements: 9.2 x  4.9 x 5.6 cm = volume: 132.6 mL. Echogenicity within normal limits. No mass or hydronephrosis visualized. Bladder: Bladder is mildly distended and has some wall thickening with trabeculation. Heterogeneous prostate measures 3.3 x 1.7 x 3.2 cm. Along the margin of the bladder inferiorly is a small echogenic shadowing area. Possible calcification or stone. Other: None. IMPRESSION: No collecting system dilatation. Slight wall thickening the urinary bladder. There is a 5 mm echogenic focus along the inferior margin of the bladder. This could be a calcification or stone. A follow-up renal stone CT could be obtained as clinically appropriate. Electronically Signed   By: Adrianna Horde M.D.   On: 08/02/2023 13:38    Labs:  CBC: Recent Labs    04/25/23 1103 05/13/23 1457 07/30/23 1645 08/06/23 0548  WBC 4.6 6.1 4.4 5.3  HGB 9.2* 9.2* 8.3* 8.8*  HCT 27.3* 27.7* 25.0* 26.3*  PLT 216 248 250 277    COAGS: Recent Labs    08/06/23 0617  INR 1.2    BMP: Recent Labs    04/25/23 1103 05/13/23 1457 07/30/23 1645 08/06/23 0548  NA 130* 132* 134* 130*  K 3.7 4.0 3.3* 3.6  CL 103 102 105 99  CO2 18* 19* 19* 22  GLUCOSE 109* 91 109* 129*  BUN 9 11 7* 10  CALCIUM  8.6* 8.7* 7.3* 8.9  CREATININE 0.63 0.66 0.42* 0.61  GFRNONAA >60 >60 >60 >60  LIVER FUNCTION TESTS: Recent Labs    03/14/23 1110 04/25/23 1103 05/13/23 1457 07/30/23 1645  BILITOT 0.6 0.6 0.9 1.0  AST 18 19 25 15   ALT 13 10 13 9   ALKPHOS 304* 426* 816* 320*  PROT 5.9* 5.9* 6.2* 5.1*  ALBUMIN 3.2* 3.0* 3.1* 2.5*    TUMOR MARKERS: No results for input(s): "AFPTM", "CEA", "CA199", "CHROMGRNA" in the last 8760 hours.  Assessment and Plan: Patient presented to ED this am with discomfort from urinary retention. Urology service was unable to advance a foley catheter. Interventional Radiology was requested for urgent suprapubic catheter placement. Request was reviewed and approved by Dr. Jinx Mourning. Patient is urgently scheduled  for same in IR today.  Patient presents for urgently scheduled suprapubic catheter placement in IR today.  Patient has been NPO since 10 pm on 5/19. All labs and medications are within acceptable parameters.  No pertinent allergies.   Risks and benefits of suprapubic catheter placement were discussed with the patient including bleeding, infection, damage to adjacent structures, bladder perforation/fistula connection, and sepsis.  All of the patient's questions were answered, patient is agreeable to proceed.  Consent signed and in chart.      Thank you for allowing our service to participate in Chael Urenda 's care.  Electronically Signed: Lovena Rubinstein, PA-C   08/06/2023, 8:22 AM      I spent a total of 40 Minutes in face to face in clinical consultation, greater than 50% of which was counseling/coordinating care for active urinary retention refractory to urethral Foley catheter placement, with consideration for urgent suprapubic catheter placement.

## 2023-08-07 ENCOUNTER — Telehealth: Payer: Self-pay

## 2023-08-07 ENCOUNTER — Encounter (HOSPITAL_COMMUNITY): Payer: Self-pay | Admitting: Radiology

## 2023-08-07 NOTE — Telephone Encounter (Signed)
 Scott [name] are you ok with this?  Patient scheduled for 6 weeks out.

## 2023-08-07 NOTE — Telephone Encounter (Signed)
 Patient's wife called to inform Dr. Inga Manges that patient had SP cath placed at the ER on 05/20.  She wanted to know if follow up was needed.  6 week follow up scheduled with Isa Manuel to upsize catheter.  I called to confirm appt with no answer, left vm requesting a call back to confirm appt.

## 2023-08-08 ENCOUNTER — Other Ambulatory Visit: Payer: Self-pay

## 2023-08-15 ENCOUNTER — Inpatient Hospital Stay: Admitting: Licensed Clinical Social Worker

## 2023-08-15 ENCOUNTER — Ambulatory Visit (HOSPITAL_COMMUNITY)

## 2023-08-15 DIAGNOSIS — C61 Malignant neoplasm of prostate: Secondary | ICD-10-CM

## 2023-08-15 NOTE — Progress Notes (Signed)
 [facility] CSW Progress Note  Visual merchandiser spoke w/ pt and spouse over the phone to check in.  Pt has had a difficult week w/ little appetite and significant nausea.  RN scheduled to come to the home tomorrow.  CSW discussed possibly utilizing more services to provide additional assistance w/ pt's ADLs which they will consider as pt has become weaker and bathing progressively more challenging.  Emotional support provided.  CSW to check in next week.      Quenton Bruns, LCSW Clinical Social Worker [facility]

## 2023-08-16 ENCOUNTER — Encounter: Payer: Self-pay | Admitting: Hematology

## 2023-08-22 ENCOUNTER — Encounter: Payer: Self-pay | Admitting: Hematology

## 2023-08-22 ENCOUNTER — Inpatient Hospital Stay: Attending: Hematology | Admitting: Licensed Clinical Social Worker

## 2023-08-22 DIAGNOSIS — C61 Malignant neoplasm of prostate: Secondary | ICD-10-CM

## 2023-08-23 ENCOUNTER — Other Ambulatory Visit: Payer: Self-pay

## 2023-08-23 ENCOUNTER — Encounter: Payer: Self-pay | Admitting: Hematology

## 2023-08-23 MED ORDER — OXYCODONE HCL 10 MG PO TABS: 10.0000 mg | ORAL_TABLET | ORAL | 0 refills | Status: AC | PRN

## 2023-08-23 NOTE — Progress Notes (Signed)
 [facility] CSW Progress Note  Clinical Child psychotherapist contacted pt and spouse to check in.  Pt reports physically feeling better than last week, but still fatigued with some nausea.  Discussed considering utilizing additional support from hospice to offer some respite to spouse.  CSW to check in next week.       Quenton Bruns, LCSW Clinical Social Worker [facility]

## 2023-08-26 ENCOUNTER — Inpatient Hospital Stay: Admitting: Licensed Clinical Social Worker

## 2023-08-26 DIAGNOSIS — C61 Malignant neoplasm of prostate: Secondary | ICD-10-CM

## 2023-08-27 ENCOUNTER — Encounter: Payer: Self-pay | Admitting: Hematology

## 2023-08-27 NOTE — Progress Notes (Signed)
 [facility] CSW Progress Note  Clinical Child psychotherapist contacted spouse by phone to check in.  Pt reportedly has become increasingly more confused over the weekend.  Spouse states pt is not agitated, but does appear to be experiencing hallucinations.  Advised to speak w/ hospice regarding anxiety medication and possible delivery of a hospital bed to assist w/ pt safety.  Progression discussed at length w/ emotional support provided.  CSW to check in w/ spouse in 1 week.       Quenton Bruns, LCSW Clinical Social Worker [facility]

## 2023-08-30 ENCOUNTER — Other Ambulatory Visit: Payer: Self-pay | Admitting: Hematology

## 2023-09-02 ENCOUNTER — Other Ambulatory Visit: Payer: Self-pay | Admitting: *Deleted

## 2023-09-02 ENCOUNTER — Inpatient Hospital Stay: Admitting: Licensed Clinical Social Worker

## 2023-09-02 ENCOUNTER — Encounter: Payer: Self-pay | Admitting: Hematology

## 2023-09-02 DIAGNOSIS — E039 Hypothyroidism, unspecified: Secondary | ICD-10-CM

## 2023-09-02 DIAGNOSIS — C61 Malignant neoplasm of prostate: Secondary | ICD-10-CM

## 2023-09-02 MED ORDER — THYROID 60 MG PO TABS
60.0000 mg | ORAL_TABLET | Freq: Every day | ORAL | 12 refills | Status: DC
Start: 2023-09-02 — End: 2023-10-08

## 2023-09-03 ENCOUNTER — Encounter: Payer: Self-pay | Admitting: Hematology

## 2023-09-03 ENCOUNTER — Other Ambulatory Visit: Payer: Self-pay | Admitting: *Deleted

## 2023-09-03 MED ORDER — LORAZEPAM 0.5 MG PO TABS: 0.5000 mg | ORAL_TABLET | ORAL | 0 refills | Status: DC | PRN

## 2023-09-03 MED ORDER — SENNA 8.6 MG PO TABS: ORAL_TABLET | ORAL | 0 refills | Status: DC

## 2023-09-03 NOTE — Progress Notes (Signed)
 [facility] CSW Progress Note  Clinical Child psychotherapist contacted pt and spouse to check in.  Pt is feeling somewhat better than last week w/ intermittent periods of confusion.  Hospital bed has been delivered to the home which has been helpful and aide services to begin later this week.  CSW to check in next week.       Quenton Bruns, LCSW Clinical Social Worker [facility]

## 2023-09-05 ENCOUNTER — Other Ambulatory Visit: Payer: Self-pay | Admitting: *Deleted

## 2023-09-05 MED ORDER — FENTANYL 75 MCG/HR TD PT72: 1.0000 | MEDICATED_PATCH | TRANSDERMAL | 0 refills | Status: DC

## 2023-09-05 MED ORDER — SCOPOLAMINE 1 MG/3DAYS TD PT72: 1.0000 | MEDICATED_PATCH | TRANSDERMAL | 3 refills | Status: DC

## 2023-09-09 ENCOUNTER — Ambulatory Visit: Admitting: Urology

## 2023-09-12 ENCOUNTER — Inpatient Hospital Stay: Admitting: Licensed Clinical Social Worker

## 2023-09-12 DIAGNOSIS — C61 Malignant neoplasm of prostate: Secondary | ICD-10-CM

## 2023-09-12 NOTE — Progress Notes (Signed)
 [facility] CSW Progress Note  Clinical Child psychotherapist contacted pt's spouse by phone to check in.  emotional support.    Interventions: Pt's wife utilized [employer] for hospice respite for 5 days last week which enabled her to get some much needed sleep and to take care of unfinished business at home.  Pt's sister visited while pt was at [facility] providing support.  Pt's brother and sister are anticipated to visit this weekend.  Pt continues to decline, but is still able to communicate his needs and is getting out of bed for short periods of time.       Follow Up Plan:  CSW to call pt's wife next week to check in.     Devere JONELLE Manna, LCSW Clinical Social Worker [facility]

## 2023-09-13 ENCOUNTER — Other Ambulatory Visit: Payer: Self-pay

## 2023-09-18 ENCOUNTER — Ambulatory Visit: Admitting: Urology

## 2023-09-19 ENCOUNTER — Inpatient Hospital Stay: Attending: Hematology | Admitting: Licensed Clinical Social Worker

## 2023-09-19 DIAGNOSIS — C61 Malignant neoplasm of prostate: Secondary | ICD-10-CM

## 2023-09-19 NOTE — Progress Notes (Signed)
 [facility] CSW Progress Note  Clinical Child psychotherapist contacted caregiver by phone to check in.    Interventions: Spouse reports pt has continued to decline this week.  Pt is now bed bound.  Pt's attending for hospice care switched to the agency this week in order to ensure medication changes can be made in a timely fashion.  Space allowed for spouse to express her emotions regarding the ending of pt's life.  CSW to check in next week.      Devere JONELLE Manna, LCSW Clinical Social Worker [facility]

## 2023-09-26 ENCOUNTER — Inpatient Hospital Stay: Admitting: Licensed Clinical Social Worker

## 2023-09-26 DIAGNOSIS — C61 Malignant neoplasm of prostate: Secondary | ICD-10-CM

## 2023-09-26 NOTE — Progress Notes (Signed)
 [facility] CSW Progress Note  Clinical Child psychotherapist contacted caregiver by phone to follow-up on emotional support.    Interventions: CSW spoke with spouse over the phone to check in.  Spouse reports pt is continuing to decline, but has been manageable at home.  Per spouse it has been helpful to switch to the hospice agency attending as medication changes are needed more frequently at this point.  CSW to check in next week.      Devere JONELLE Manna, LCSW Clinical Social Worker [facility]

## 2023-10-03 ENCOUNTER — Inpatient Hospital Stay: Admitting: Licensed Clinical Social Worker

## 2023-10-03 DIAGNOSIS — C61 Malignant neoplasm of prostate: Secondary | ICD-10-CM

## 2023-10-04 ENCOUNTER — Encounter: Payer: Self-pay | Admitting: Hematology

## 2023-10-04 NOTE — Progress Notes (Signed)
 [facility] CSW Progress Note  Clinical Child psychotherapist contacted caregiver by phone to follow-up on emotional support.    Interventions: Per pt's wife he has decompensated considerably.  Pt is no longer verbal and it is anticipated he will pass soon.  Emotional support provided.  CSW to remain available as appropriate.          Devere JONELLE Manna, LCSW Clinical Social Worker [facility]

## 2023-10-18 DEATH — deceased
